# Patient Record
Sex: Female | Born: 1945 | Race: White | Hispanic: No | Smoking: Never smoker
Health system: Southern US, Community
[De-identification: ages and names within clinical notes are randomized; demographics above are authoritative.]

## PROBLEM LIST (undated history)

## (undated) DIAGNOSIS — F32A Depression, unspecified: Secondary | ICD-10-CM

## (undated) DIAGNOSIS — I429 Cardiomyopathy, unspecified: Secondary | ICD-10-CM

## (undated) DIAGNOSIS — I10 Essential (primary) hypertension: Secondary | ICD-10-CM

## (undated) DIAGNOSIS — I471 Supraventricular tachycardia, unspecified: Secondary | ICD-10-CM

## (undated) DIAGNOSIS — J984 Other disorders of lung: Secondary | ICD-10-CM

## (undated) DIAGNOSIS — M1990 Unspecified osteoarthritis, unspecified site: Secondary | ICD-10-CM

## (undated) DIAGNOSIS — I2510 Atherosclerotic heart disease of native coronary artery without angina pectoris: Secondary | ICD-10-CM

## (undated) DIAGNOSIS — J449 Chronic obstructive pulmonary disease, unspecified: Secondary | ICD-10-CM

## (undated) DIAGNOSIS — F419 Anxiety disorder, unspecified: Secondary | ICD-10-CM

## (undated) DIAGNOSIS — K5790 Diverticulosis of intestine, part unspecified, without perforation or abscess without bleeding: Secondary | ICD-10-CM

## (undated) DIAGNOSIS — R0600 Dyspnea, unspecified: Secondary | ICD-10-CM

## (undated) DIAGNOSIS — K219 Gastro-esophageal reflux disease without esophagitis: Secondary | ICD-10-CM

## (undated) DIAGNOSIS — F329 Major depressive disorder, single episode, unspecified: Secondary | ICD-10-CM

## (undated) DIAGNOSIS — G4730 Sleep apnea, unspecified: Secondary | ICD-10-CM

## (undated) DIAGNOSIS — S12100A Unspecified displaced fracture of second cervical vertebra, initial encounter for closed fracture: Secondary | ICD-10-CM

## (undated) HISTORY — PX: TUBAL LIGATION: SHX77

## (undated) HISTORY — DX: Atherosclerotic heart disease of native coronary artery without angina pectoris: I25.10

## (undated) HISTORY — DX: Anxiety disorder, unspecified: F41.9

## (undated) HISTORY — DX: Cardiomyopathy, unspecified: I42.9

## (undated) HISTORY — DX: Diverticulosis of intestine, part unspecified, without perforation or abscess without bleeding: K57.90

## (undated) HISTORY — PX: TOTAL KNEE ARTHROPLASTY: SHX125

## (undated) HISTORY — DX: Supraventricular tachycardia: I47.1

## (undated) HISTORY — PX: OTHER SURGICAL HISTORY: SHX169

## (undated) HISTORY — DX: Gastro-esophageal reflux disease without esophagitis: K21.9

## (undated) HISTORY — DX: Other disorders of lung: J98.4

## (undated) HISTORY — DX: Depression, unspecified: F32.A

## (undated) HISTORY — DX: Supraventricular tachycardia, unspecified: I47.10

## (undated) HISTORY — PX: ANTERIOR RELEASE VERTEBRAL BODY W/ POSTERIOR FUSION: SUR290

## (undated) HISTORY — PX: ABDOMINAL HYSTERECTOMY: SHX81

## (undated) HISTORY — DX: Essential (primary) hypertension: I10

## (undated) HISTORY — DX: Major depressive disorder, single episode, unspecified: F32.9

## (undated) HISTORY — DX: Chronic obstructive pulmonary disease, unspecified: J44.9

---

## 1971-09-14 HISTORY — PX: CHOLECYSTECTOMY: SHX55

## 2003-07-23 ENCOUNTER — Encounter: Admission: RE | Admit: 2003-07-23 | Discharge: 2003-07-23 | Payer: Self-pay | Admitting: Orthopedic Surgery

## 2005-05-19 ENCOUNTER — Inpatient Hospital Stay (HOSPITAL_COMMUNITY): Admission: RE | Admit: 2005-05-19 | Discharge: 2005-05-22 | Payer: Self-pay | Admitting: Orthopedic Surgery

## 2005-05-19 ENCOUNTER — Ambulatory Visit: Payer: Self-pay | Admitting: Physical Medicine & Rehabilitation

## 2005-10-12 ENCOUNTER — Encounter: Admission: RE | Admit: 2005-10-12 | Discharge: 2005-10-12 | Payer: Self-pay | Admitting: *Deleted

## 2005-10-18 ENCOUNTER — Ambulatory Visit: Payer: Self-pay | Admitting: Cardiology

## 2005-10-22 ENCOUNTER — Ambulatory Visit: Payer: Self-pay | Admitting: Cardiology

## 2005-12-01 ENCOUNTER — Inpatient Hospital Stay (HOSPITAL_COMMUNITY): Admission: RE | Admit: 2005-12-01 | Discharge: 2005-12-02 | Payer: Self-pay | Admitting: Orthopaedic Surgery

## 2006-08-08 ENCOUNTER — Observation Stay (HOSPITAL_COMMUNITY): Admission: RE | Admit: 2006-08-08 | Discharge: 2006-08-09 | Payer: Self-pay | Admitting: Orthopaedic Surgery

## 2008-05-14 ENCOUNTER — Encounter: Payer: Self-pay | Admitting: Cardiology

## 2008-05-31 ENCOUNTER — Ambulatory Visit: Payer: Self-pay | Admitting: Cardiology

## 2008-06-06 ENCOUNTER — Encounter: Payer: Self-pay | Admitting: Cardiology

## 2008-06-07 ENCOUNTER — Ambulatory Visit: Payer: Self-pay | Admitting: Cardiology

## 2008-06-17 ENCOUNTER — Ambulatory Visit: Payer: Self-pay | Admitting: Cardiology

## 2008-06-25 ENCOUNTER — Encounter: Payer: Self-pay | Admitting: Cardiology

## 2008-11-11 ENCOUNTER — Ambulatory Visit: Payer: Self-pay | Admitting: Cardiology

## 2008-11-14 ENCOUNTER — Ambulatory Visit: Payer: Self-pay | Admitting: Cardiovascular Disease

## 2008-11-14 ENCOUNTER — Inpatient Hospital Stay (HOSPITAL_BASED_OUTPATIENT_CLINIC_OR_DEPARTMENT_OTHER): Admission: RE | Admit: 2008-11-14 | Discharge: 2008-11-14 | Payer: Self-pay | Admitting: Cardiovascular Disease

## 2009-01-15 ENCOUNTER — Encounter: Payer: Self-pay | Admitting: Cardiology

## 2009-03-10 ENCOUNTER — Encounter: Payer: Self-pay | Admitting: Cardiology

## 2009-05-28 ENCOUNTER — Encounter: Payer: Self-pay | Admitting: Cardiology

## 2009-06-04 ENCOUNTER — Ambulatory Visit: Payer: Self-pay | Admitting: Cardiology

## 2009-06-04 DIAGNOSIS — E782 Mixed hyperlipidemia: Secondary | ICD-10-CM | POA: Insufficient documentation

## 2009-06-04 DIAGNOSIS — F411 Generalized anxiety disorder: Secondary | ICD-10-CM | POA: Insufficient documentation

## 2009-06-04 DIAGNOSIS — R002 Palpitations: Secondary | ICD-10-CM | POA: Insufficient documentation

## 2009-06-04 DIAGNOSIS — I2510 Atherosclerotic heart disease of native coronary artery without angina pectoris: Secondary | ICD-10-CM | POA: Insufficient documentation

## 2009-06-04 DIAGNOSIS — R0609 Other forms of dyspnea: Secondary | ICD-10-CM | POA: Insufficient documentation

## 2009-06-04 DIAGNOSIS — I10 Essential (primary) hypertension: Secondary | ICD-10-CM | POA: Insufficient documentation

## 2009-06-17 ENCOUNTER — Encounter: Payer: Self-pay | Admitting: Cardiology

## 2009-09-09 ENCOUNTER — Encounter (INDEPENDENT_AMBULATORY_CARE_PROVIDER_SITE_OTHER): Payer: Self-pay | Admitting: *Deleted

## 2009-11-28 ENCOUNTER — Encounter: Admission: RE | Admit: 2009-11-28 | Discharge: 2009-11-28 | Payer: Self-pay | Admitting: Orthopaedic Surgery

## 2010-02-13 ENCOUNTER — Ambulatory Visit (HOSPITAL_COMMUNITY): Admission: RE | Admit: 2010-02-13 | Discharge: 2010-02-13 | Payer: Self-pay | Admitting: Orthopedic Surgery

## 2010-02-18 ENCOUNTER — Encounter: Admission: RE | Admit: 2010-02-18 | Discharge: 2010-02-18 | Payer: Self-pay | Admitting: Orthopedic Surgery

## 2010-04-01 ENCOUNTER — Inpatient Hospital Stay (HOSPITAL_COMMUNITY): Admission: RE | Admit: 2010-04-01 | Discharge: 2010-04-03 | Payer: Self-pay | Admitting: Orthopedic Surgery

## 2010-08-01 ENCOUNTER — Encounter: Admission: RE | Admit: 2010-08-01 | Discharge: 2010-08-01 | Payer: Self-pay | Admitting: Neurology

## 2010-10-03 ENCOUNTER — Encounter: Payer: Self-pay | Admitting: Orthopedic Surgery

## 2010-10-22 ENCOUNTER — Other Ambulatory Visit: Payer: Self-pay | Admitting: Sports Medicine

## 2010-10-22 DIAGNOSIS — M545 Low back pain, unspecified: Secondary | ICD-10-CM

## 2010-10-27 ENCOUNTER — Ambulatory Visit
Admission: RE | Admit: 2010-10-27 | Discharge: 2010-10-27 | Disposition: A | Discharge status: Home / Self Care | Payer: MEDICARE | Source: Ambulatory Visit | Attending: Sports Medicine | Admitting: Sports Medicine

## 2010-10-27 DIAGNOSIS — M545 Low back pain, unspecified: Secondary | ICD-10-CM

## 2010-11-28 LAB — PROTIME-INR
INR: 0.93 (ref 0.00–1.49)
INR: 1.01 (ref 0.00–1.49)
INR: 1.26 (ref 0.00–1.49)
Prothrombin Time: 12.4 seconds (ref 11.6–15.2)
Prothrombin Time: 13.2 seconds (ref 11.6–15.2)
Prothrombin Time: 15.7 seconds — ABNORMAL HIGH (ref 11.6–15.2)

## 2010-11-28 LAB — BASIC METABOLIC PANEL
BUN: 10 mg/dL (ref 6–23)
BUN: 8 mg/dL (ref 6–23)
CO2: 28 mEq/L (ref 19–32)
CO2: 29 mEq/L (ref 19–32)
Calcium: 7.8 mg/dL — ABNORMAL LOW (ref 8.4–10.5)
Calcium: 7.9 mg/dL — ABNORMAL LOW (ref 8.4–10.5)
Chloride: 100 mEq/L (ref 96–112)
Chloride: 105 mEq/L (ref 96–112)
Creatinine, Ser: 0.7 mg/dL (ref 0.4–1.2)
Creatinine, Ser: 0.75 mg/dL (ref 0.4–1.2)
GFR calc Af Amer: >60 mL/min (ref >60)
GFR calc Af Amer: >60 mL/min (ref >60)
GFR calc non Af Amer: >60 mL/min (ref >60)
GFR calc non Af Amer: >60 mL/min (ref >60)
Glucose, Bld: 110 mg/dL — ABNORMAL HIGH (ref 70–99)
Glucose, Bld: 113 mg/dL — ABNORMAL HIGH (ref 70–99)
Potassium: 3.8 mEq/L (ref 3.5–5.1)
Potassium: 3.9 mEq/L (ref 3.5–5.1)
Sodium: 134 mEq/L — ABNORMAL LOW (ref 135–145)
Sodium: 139 mEq/L (ref 135–145)

## 2010-11-28 LAB — CBC
HCT: 29.9 % — ABNORMAL LOW (ref 36.0–46.0)
HCT: 32.9 % — ABNORMAL LOW (ref 36.0–46.0)
HCT: 42.4 % (ref 36.0–46.0)
Hemoglobin: 10 g/dL — ABNORMAL LOW (ref 12.0–15.0)
Hemoglobin: 11.1 g/dL — ABNORMAL LOW (ref 12.0–15.0)
Hemoglobin: 14.5 g/dL (ref 12.0–15.0)
MCH: 31.2 pg (ref 26.0–34.0)
MCH: 31.2 pg (ref 26.0–34.0)
MCH: 31.3 pg (ref 26.0–34.0)
MCHC: 33.5 g/dL (ref 30.0–36.0)
MCHC: 33.8 g/dL (ref 30.0–36.0)
MCHC: 34.1 g/dL (ref 30.0–36.0)
MCV: 91.9 fL (ref 78.0–100.0)
MCV: 92.3 fL (ref 78.0–100.0)
MCV: 93.2 fL (ref 78.0–100.0)
Platelets: 180 10*3/uL (ref 150–400)
Platelets: 196 10*3/uL (ref 150–400)
Platelets: 262 10*3/uL (ref 150–400)
RBC: 3.2 MIL/uL — ABNORMAL LOW (ref 3.87–5.11)
RBC: 3.56 MIL/uL — ABNORMAL LOW (ref 3.87–5.11)
RBC: 4.62 MIL/uL (ref 3.87–5.11)
RDW: 13 % (ref 11.5–15.5)
RDW: 13.1 % (ref 11.5–15.5)
RDW: 13.3 % (ref 11.5–15.5)
WBC: 10.8 10*3/uL — ABNORMAL HIGH (ref 4.0–10.5)
WBC: 9.4 10*3/uL (ref 4.0–10.5)
WBC: 9.8 10*3/uL (ref 4.0–10.5)

## 2010-11-28 LAB — COMPREHENSIVE METABOLIC PANEL
ALT: 20 U/L (ref 0–35)
AST: 23 U/L (ref 0–37)
Albumin: 4.6 g/dL (ref 3.5–5.2)
Alkaline Phosphatase: 60 U/L (ref 39–117)
BUN: 16 mg/dL (ref 6–23)
CO2: 27 mEq/L (ref 19–32)
Calcium: 9.3 mg/dL (ref 8.4–10.5)
Chloride: 100 mEq/L (ref 96–112)
Creatinine, Ser: 0.84 mg/dL (ref 0.4–1.2)
GFR calc Af Amer: >60 mL/min (ref >60)
GFR calc non Af Amer: >60 mL/min (ref >60)
Glucose, Bld: 97 mg/dL (ref 70–99)
Potassium: 3.7 mEq/L (ref 3.5–5.1)
Sodium: 136 mEq/L (ref 135–145)
Total Bilirubin: 0.9 mg/dL (ref 0.3–1.2)
Total Protein: 7.2 g/dL (ref 6.0–8.3)

## 2010-11-28 LAB — URINE MICROSCOPIC-ADD ON

## 2010-11-28 LAB — URINALYSIS, ROUTINE W REFLEX MICROSCOPIC
Bilirubin Urine: NEGATIVE
Glucose, UA: NEGATIVE mg/dL
Hgb urine dipstick: NEGATIVE
Ketones, ur: NEGATIVE mg/dL
Nitrite: NEGATIVE
Protein, ur: NEGATIVE mg/dL
Specific Gravity, Urine: 1.016 (ref 1.005–1.030)
Urobilinogen, UA: 0.2 mg/dL (ref 0.0–1.0)
pH: 6.5 (ref 5.0–8.0)

## 2010-11-28 LAB — SURGICAL PCR SCREEN
MRSA, PCR: POSITIVE — AB
Staphylococcus aureus: POSITIVE — AB

## 2010-11-28 LAB — APTT: aPTT: 28 seconds (ref 24–37)

## 2010-11-30 LAB — COMPREHENSIVE METABOLIC PANEL
ALT: 23 U/L (ref 0–35)
AST: 29 U/L (ref 0–37)
Albumin: 4 g/dL (ref 3.5–5.2)
Alkaline Phosphatase: 72 U/L (ref 39–117)
BUN: 9 mg/dL (ref 6–23)
CO2: 26 mEq/L (ref 19–32)
Calcium: 9.6 mg/dL (ref 8.4–10.5)
Chloride: 103 mEq/L (ref 96–112)
Creatinine, Ser: 0.83 mg/dL (ref 0.4–1.2)
GFR calc Af Amer: >60 mL/min (ref >60)
GFR calc non Af Amer: >60 mL/min (ref >60)
Glucose, Bld: 95 mg/dL (ref 70–99)
Potassium: 3.9 mEq/L (ref 3.5–5.1)
Sodium: 138 mEq/L (ref 135–145)
Total Bilirubin: 0.5 mg/dL (ref 0.3–1.2)
Total Protein: 6.8 g/dL (ref 6.0–8.3)

## 2010-11-30 LAB — CBC
HCT: 39.8 % (ref 36.0–46.0)
Hemoglobin: 13.8 g/dL (ref 12.0–15.0)
MCHC: 34.7 g/dL (ref 30.0–36.0)
MCV: 91 fL (ref 78.0–100.0)
Platelets: 263 10*3/uL (ref 150–400)
RBC: 4.38 MIL/uL (ref 3.87–5.11)
RDW: 12.6 % (ref 11.5–15.5)
WBC: 8 10*3/uL (ref 4.0–10.5)

## 2010-11-30 LAB — URINALYSIS, ROUTINE W REFLEX MICROSCOPIC
Bilirubin Urine: NEGATIVE
Glucose, UA: NEGATIVE mg/dL
Hgb urine dipstick: NEGATIVE
Ketones, ur: NEGATIVE mg/dL
Nitrite: NEGATIVE
Protein, ur: NEGATIVE mg/dL
Specific Gravity, Urine: 1.012 (ref 1.005–1.030)
Urobilinogen, UA: 0.2 mg/dL (ref 0.0–1.0)
pH: 6 (ref 5.0–8.0)

## 2010-11-30 LAB — DIFFERENTIAL
Basophils Absolute: 0 10*3/uL (ref 0.0–0.1)
Basophils Relative: 1 % (ref 0–1)
Eosinophils Absolute: 0.1 10*3/uL (ref 0.0–0.7)
Eosinophils Relative: 2 % (ref 0–5)
Lymphocytes Relative: 32 % (ref 12–46)
Lymphs Abs: 2.6 10*3/uL (ref 0.7–4.0)
Monocytes Absolute: 0.7 10*3/uL (ref 0.1–1.0)
Monocytes Relative: 9 % (ref 3–12)
Neutro Abs: 4.6 10*3/uL (ref 1.7–7.7)
Neutrophils Relative %: 57 % (ref 43–77)

## 2010-11-30 LAB — PROTIME-INR
INR: 0.97 (ref 0.00–1.49)
Prothrombin Time: 12.8 seconds (ref 11.6–15.2)

## 2010-11-30 LAB — APTT: aPTT: 26 seconds (ref 24–37)

## 2010-11-30 LAB — TYPE AND SCREEN
ABO/RH(D): A POS
Antibody Screen: NEGATIVE

## 2010-11-30 LAB — ABO/RH: ABO/RH(D): A POS

## 2010-12-24 LAB — POCT I-STAT 3, VENOUS BLOOD GAS (G3P V)
Acid-base deficit: 3 mmol/L — ABNORMAL HIGH (ref 0.0–2.0)
Bicarbonate: 22.4 mEq/L (ref 20.0–24.0)
O2 Saturation: 61 %
TCO2: 24 mmol/L (ref 0–100)
pCO2, Ven: 38.7 mmHg — ABNORMAL LOW (ref 45.0–50.0)
pH, Ven: 7.372 — ABNORMAL HIGH (ref 7.250–7.300)
pO2, Ven: 32 mmHg (ref 30.0–45.0)

## 2010-12-24 LAB — POCT I-STAT 3, ART BLOOD GAS (G3+)
Acid-base deficit: 3 mmol/L — ABNORMAL HIGH (ref 0.0–2.0)
Bicarbonate: 20.6 mEq/L (ref 20.0–24.0)
O2 Saturation: 94 %
TCO2: 22 mmol/L (ref 0–100)
pCO2 arterial: 33.3 mmHg — ABNORMAL LOW (ref 35.0–45.0)
pH, Arterial: 7.4 (ref 7.350–7.400)
pO2, Arterial: 68 mmHg — ABNORMAL LOW (ref 80.0–100.0)

## 2011-01-26 NOTE — Assessment & Plan Note (Signed)
[facility]                          [facility]   Michelle Zavala, Michelle Zavala                        MRN:          [id number]  DATE:06/17/2008                            DOB:          August 01, 1946    CARDIOLOGIST:  Jonelle Sidle, MD   PRIMARY CARE PHYSICIAN:  Dr. Samuel Jester   REASON FOR VISIT:  Two-week followup.   HISTORY OF PRESENT ILLNESS:  Michelle Zavala is a 65 year old female patient  whom I saw on May 31, 2008 with complaints of fairly atypical  chest pain.  She had been evaluated recently in the [facility] Emergency  Room.  She had a negative D-dimer.  She does have a CT scan of her chest  that was done in May of this year that revealed scattered pulmonary  nodules of unclear significance.  Recommendation was for followup CT  scanning in 6-12 months.  She did have a chest x-ray in September that  demonstrated a vague area of nodularity in the lower lateral chest  previously seen on the CT scan.  This was questionable for pulmonary  fibrosis.  I had the patient increase her Protonix to 40 mg twice a day  and start on metoprolol 25 mg twice a day and also gave her a  prescription for nitroglycerin p.r.n.  She underwent a stress test and  echocardiogram.  The stress Cardiolite revealed normal LV perfusion.  Her EF was 64%.  She did have poor exercise tolerance, only exercised  for 3 minutes and 26 seconds.  She had no chest pain, but did have  dyspnea.  Her echocardiogram revealed an EF of 50-55% mild mitral  regurgitation.  Today, she notes that she continues to have symptoms of  chest pain and shortness of breath.  She has a pleuritic component to  her symptoms that are located in the left lateral chest around her  breast to her posterior thoracic area.  This is clearly worse with  coughing or position changes.  She also notes tightness in her chest  that is fairly consistent.  She had used Combivent in the past with some  relief.  She has also noted some relief with nitroglycerin.  She does  note dyspnea with exertion.  This has been present for the last 3  months.  She describes NYHA class IIB-III symptoms.  She denies true  orthopnea.  She also denies true paroxysmal nocturnal dyspnea.  She does  have some difficulty sleeping at times.  She denies pedal edema.  She  denies syncope.  She does have a chronic cough.  This has been present  for the last several months.  There is productive of yellow sputum.  It  is usually in the mornings.  She has gone through 1 round of antibiotics  with her primary care physician.  Of note, she sees Dr. Orson Aloe later  this month for pulmonary evaluation.   MEDICATIONS:  Premarin 0.65 mg daily, Zoloft 100 mg daily, Micardis HCTZ  40/12.5 mg daily, Topiramate 50 mg nightly, Caltrate 600,  Vitamin D b.i.d., Protonix 40 mg b.i.d., Metoprolol  25 mg b.i.d.,  Aspirin 81 mg daily - The patient is not taking this, antidepressant  (recently started by her primary care physician), Ativan 2 mg p.r.n.,  Nitroglycerin p.r.n. chest pain.   PHYSICAL EXAMINATION:  GENERAL:  She is a well-nourished and well-  nourished female in no acute stress.  VITAL SIGNS:  Blood pressure is 142/93, pulse 73, and weight 216 pounds.  HEENT:  Normal.  NECK:  Without JVD.  CARDIAC:  Normal S1 and S2.  Regular rate and rhythm without murmur.  LUNGS:  Clear to auscultation bilaterally.  No wheezing, rhonchi, or  rales.  ABDOMEN:  Soft and nontender.  EXTREMITIES:  Trace edema bilaterally.  NEUROLOGIC:  She is alert and oriented x3.  Cranial nerves II-XII  grossly intact.  SKIN:  Warm and dry.   ASSESSMENT AND PLAN:  1. Chest pain and dyspnea.  As outlined above, the patient's recent      stress test revealed normal perfusion.  Her echocardiogram revealed      normal left ventricular function.  She has no significant valvular      abnormalities.  In the review of her recent workup in the emergency       room, she had a normal BNP.  She also had a normal D-dimer.      Adjusting her Protonix did not provide any symptom relief.  Also      the addition of metoprolol did not provide any symptom relief.  She      has actually had some side effects with this      (flushing/diaphoresis).  The patient has a long history of      secondhand smoke exposure.  It was quite possible that she does      have chronic obstructive pulmonary disease.  She also has reported      pulmonary fibrosis by radiographic testing.  She sees Dr. Orson Aloe      later this month.  I had a long discussion with the patient today      regarding further workup.  Also, discussed the case today with Dr.      Diona Browner.  At this point, we will continue to adjust her      medications for blood pressure to treat possible diastolic      dysfunction.  We will set up for pulmonary function testing with      [facility]CO.  This will hopefully assist Dr. Orson Aloe in his workup later      this month.  Regarding her abnormal chest CT in the past, further      recommendations will be per Dr. Orson Aloe.  I have provide her with      a prescription for Combivent inhaler 1-2 puffs q.i.d.  I have      advised her start using this after she completes her pulmonary      function testing.  She has a pleuritic component to her chest pain.      I have asked her to use heat over this area and also use      nonsteroidal anti-inflammatory medications consistently for the      next 5-7 days, then p.r.n. after that.  She will be brought back in      followup in the next several weeks.  If her pulmonary evaluation is      essentially negative, we can certainly entertain further      cardiovascular workup at that time, although ischemia seems  unlikely at this point.  2. Hypertension.  The patient's metoprolol will be discontinued.      Amlodipine will be added to her medical regimen 5 mg daily.  3. Depression.  New anti-depressant medication has been  added to her      medical regimen by primary care physician.  The patient actually      notes that she feels somewhat better on this medication.  I do not      think depression is the primary cause of her symptoms, although it      can certainly be contributing somewhat.  We will have to keep this      in mind as we go forward with her workup.   DISPOSITION:  The patient will be brought back in followup with Dr.  Diona Browner in the next 1-2 months for further review.      Tereso Newcomer, PA-C  Electronically Signed      Jonelle Sidle, MD  Electronically Signed   SW/MedQ  DD: 06/17/2008  DT: 06/18/2008  Job #: [id number]   cc:   Samuel Jester

## 2011-01-26 NOTE — Cardiovascular Report (Signed)
NAMEMarland Kitchen  Zavala, Michelle                 ACCOUNT NO.:  [id number]   MEDICAL RECORD NO.:  [id number]          PATIENT TYPE:  OIB   LOCATION:  1963                         FACILITY:  [facility]   PHYSICIAN:  Veverly Fells. Excell Seltzer, MD  DATE OF BIRTH:  [birth year]   DATE OF PROCEDURE:  11/14/2008  DATE OF DISCHARGE:  11/14/2008                            CARDIAC CATHETERIZATION   PROCEDURES:  1. Left heart catheterization.  2. Right heart catheterization.  3. Selective coronary angiography.  4. Left ventricular angiography.   INDICATIONS:  Michelle Zavala is a 65 year old woman with chronic chest pain  and shortness of breath.  Her most significant complaint is exertional  dyspnea.  She has also had some atypical left lateral chest discomfort.  She has undergone noninvasive testing and has been fairly unremarkable.  She was referred for right and left heart catheterization for definitive  evaluation in the setting of her severe symptoms.   Risks and indications of the procedure were reviewed with the patient  and informed consent was obtained.  The right groin was prepped, draped,  and anesthetized with 1% lidocaine using modified Seldinger technique.  A 4-French sheath was placed in the right femoral artery and 6-French  sheath was placed in the right femoral vein.  Multipurpose end-hole  catheter was used for the right heart catheterization.  Hemodynamic  pressures were recorded from the right atrium to the pulmonary capillary  wedge position.  Oxygen saturations were drawn.  Fick cardiac output was  calculated.  Standard 4-French Judkins catheters were used for coronary  angiography and left ventriculography and a J-wire was used for all  catheter exchanges.  The patient tolerated the procedure well.  There  were no immediate complications.   FINDINGS:  Right atrial pressure 5, right ventricular pressure 19/4,  pulmonary artery pressure 18/6 with a mean of 12, pulmonary capillary  wedge pressure 16,  left ventricular pressure 98/6, and aortic pressure  98/66 with a mean of 18.   Oxygen saturation of the pulmonary artery 61% and aorta 94%.   Fick cardiac output 4 L/minute.   CORONARY ANGIOGRAPHY:  Left mainstem:  Angiographically normal.  Bifurcates into the LAD and left circumflex.   LAD:  LAD is a large-caliber vessel proximally.  There is a moderate  size first diagonal branch with no obstructive disease.  The proximal  LAD has no evidence of obstructive stenosis with the exception of very  minor plaquing near the ostium of the vessel.  This appears to produce a  20-25% stenosis.  There is an abnormal filling pattern in the LAD with  slow flow in the proximal vessel.  The remaining portions of the mid and  distal LAD have no significant obstructive disease.  The LAD goes on to  supply two lower diagonal branches with no significant obstructive  disease.   Left circumflex:  The left circumflex is codominant with the right  coronary artery.  The vessel is of large caliber and supplies a moderate-  sized first OM branch.  There is some ectasia in the mid vessel.  Further down, there is a  widely patent second OM branch as well as a  left PDA branch.  There is no stenosis throughout the left circumflex.   Right coronary artery:  Small and codominant.  No significant  obstructive disease.  Supplies a small RV marginal branch.   Left ventriculography shows normal LV systolic function.  LVEF is  estimated at 60-65%.  The aorta at the level of the sinuses of Valsalva  appears mildly dilated as does the proximal aortic root.  No other  abnormalities noted.   ASSESSMENT:  1. Mild nonobstructive coronary artery disease involving the proximal      left anterior descending with abnormal left anterior descending      filling pattern.  2. No significant obstructive disease in the left mainstem, left      circumflex or right coronary artery.  3. Normal left ventricular function.  4.  Mildly dilated aortic root.  5. Normal right heart hemodynamics.   Michelle Zavala has minor nonobstructive coronary disease and should do well  with medical therapy.  Her hemodynamics are reassuring that her cardiac  function is not contributing to her symptoms of shortness of breath.  Her aortic root appears mildly dilated by angiography in the proximal  root and level of the sinuses of Valsalva.  However, her echocardiogram  findings were reviewed and the sinuses and root measured within the  normal limits.      Veverly Fells. Excell Seltzer, MD  Electronically Signed     MDC/MEDQ  D:  11/14/2008  T:  11/14/2008  Job:  [id number]   cc:   Learta Codding, MD,FACC  Jonelle Sidle, MD  Samuel Jester

## 2011-01-26 NOTE — Assessment & Plan Note (Signed)
[facility]                          [facility]   ROSELA, SUPAK                        MRN:          [id number]  DATE:05/31/2008                            DOB:          [birth year]    REFERRING PHYSICIAN:  Samuel Jester   CARIOLOGY CONSULTATION NOTE:   REASON FOR REFERRAL:  Chest pain.   HISTORY OF PRESENT ILLNESS:  Ms. Lauritsen is very pleasant 65 year old  female patient with a history of chest discomfort and palpitations.  She  was last seen in the office by Roxanne Mins, PA-C in 2007.  At that  time she underwent an adenosine Cardiolite that showed no sign of scar  or ischemia and her EF was normal.  The patient tells me she had a heart  catheterization in [facility] in the 1980s.  This  was apparently normal per her report.  I do not have those records at  this time.  Of note, she has also had carotid Dopplers performed in  January 2007, that showed no evidence of carotid plaquing.  She recently  presented to [facility] emergency room with complaints of chest  discomfort.  She had one set of cardiac markers that were negative.  Her  electrocardiogram was unremarkable.  She did have a chest x-ray that  showed a vague area of nodularity in the lower lateral chest previously  seen on CT examination representing an area of pulmonary fibrosis.  Otherwise, there was no acute process.  She was referred by Dr. Charm Barges  for further evaluation.   The patient notes a 77-month history of chest discomfort.  She describes  a stabbing type pain around her left breast that radiates to her back.  This initially began when she would cough or sneeze.  She also notes  discomfort with exertion.  In addition to this chest discomfort, she  notes substernal chest heaviness or tightness.  This is also with  exertion.  She notes associated shortness of breath.  She notes some  nausea at times, as well as some numbness  in her jaw.  She denies  syncope.  She has noted some near syncope in the past.  She does note  some episodes of tachy palpitations that seemed to be unassociated with  her chest pain.  In addition to the above, the patient also notes chest  discomfort with lying on her back, as well as with positional changes.   PAST MEDICAL HISTORY:  Significant for hypertension, GERD, depression,  diverticulosis and degenerative joint disease.  She is status post left  total knee replacement, cholecystectomy, total abdominal hysterectomy,  and bilateral salpingo-oophorectomy.  She is also status post cervical  spine surgery.   MEDICATIONS:  Protonix 40 mg daily, Premarin 0.625 mg daily, Zoloft 100  mg daily, Micardis, HCTZ 40/12.5 mg daily, Topiramate 850 mg nightly,  Naproxen 500 mg b.i.d.,  Fish oil 1 g daily, Caltrate plus vitamin D b.i.d., Ativan p.r.n.   ALLERGIES:  SULFA causes a rash.   SOCIAL HISTORY:  The patient has never been a smoker.  However, her  deceased husband used to smoke quite frequently.  She is a widow.  She  had 4 children, 3 of whom are alive.  She had one child who was shot to  death.   FAMILY HISTORY:  Significant for premature CAD.  She has several  siblings who have died from myocardial infarction.  She had a brother  who died at 17 and another brother in his 69s.  She has one brother who  is still live who had his first MI in his 89s.  He is apparently on the  transplant list.  She also has a sister who died of a myocardial  infarction in her 26s.   REVIEW OF SYSTEMS:  Please see HPI.  Denies fevers, chills, cough,  melena, hematochezia, hematuria.  Rest of the review of systems are  negative.   PHYSICAL EXAMINATION:  GENERAL:  She is a well-nourished, well-developed  female.  VITAL SIGNS:  Blood pressure is 132/94, pulse 92, weight 210 pounds.  HEENT:  Normal.  NECK:  Without JVD.  CARDIAC:  S1 and S2.  Regular rate and rhythm without murmur.  LUNGS:  Clear  to auscultation bilaterally.  ABDOMEN:  Soft, nontender.  EXTREMITIES:  Without edema.  Calves are nontender.  SKIN:  Warm and dry.  NEUROLOGIC:  She is alert and oriented x3.  Cranial nerves II through  XII are grossly intact.  VASCULAR:  Without carotid bruits bilaterally.  Femoral artery pulses 2+  bilateral without bruits.  Distal pulses are difficult to palpate.  MUSCULOSKELETAL:  No joint deformity.   Electrocardiogram from May 14, 2008, in emergency room showed sinus  rhythm with heart rate of 87, left axis deviation, poor R-wave  progression, no ischemic changes.   ASSESSMENT/PLAN:  1. Chest pain in a 65 year old female patient with cardiac risk      factors including a significant family history of coronary artery      disease, as well as hypertension and secondhand smoke exposure.      Her symptoms of chest pain have both typical and atypical features.      I think at this point in time it is reasonable to proceed with      stress testing initially.  I discussed this with the patient and      she agrees.  Therefore, she will be set up for a stress Cardiolite      to rule out ischemic heart disease.  We will also set up an      echocardiogram as she does complain of some shortness of breath.      Of note, she had labs performed at [facility], May 14, 2008, when she presented with complaints of chest pain.  Her      creatinine was 0.8 at that time, cardiac markers negative x1, D-      dimer 0.33, BNP 25, hemoglobin 13.8.  I have asked the patient to      increase her Protonix to 40 mg b.i.d. to cover for a      gastrointestinal etiology.  I have also placed her on metoprolol 25      mg b.i.d. for better blood pressure control, as well as to act as      an antianginal.  She has also been provided with a prescription for      p.r.n. nitroglycerin.  I have also asked her to go on an enteric-      coated aspirin 81 mg  daily.  We will bring her back in close  follow-      up after her stress test and echocardiogram.  I discussed the      patient's case today extensively with Dr. Diona Browner who agreed with      the above assessment and plan.  2. Hypertension.  As outlined above, we will add metoprolol to her      medical regimen.   DISPOSITION:  The patient will be brought back in followup in the next 2-  3 weeks with Dr. Diona Browner.      Tereso Newcomer, PA-C  Electronically Signed      Jonelle Sidle, MD  Electronically Signed   SW/MedQ  DD: 05/31/2008  DT: 06/01/2008  Job #: [id number]   cc:   Samuel Jester

## 2011-01-26 NOTE — Assessment & Plan Note (Signed)
[facility]                          [facility]   DEMRI, POULTON                        MRN:          [id number]  DATE:11/11/2008                            DOB:          1946/04/19    PRIMARY CARE PHYSICIAN:  Samuel Jester, M.D.   REASON FOR VISIT:  Follow up shortness of breath.   HISTORY OF PRESENT ILLNESS:  Ms. Pinales was seen in the office back in  October 2009 by Mr. Alben Spittle.  She has a history of atypical chest and  thoracic pain as well as progressive shortness of breath.  She has  undergone previous cardiac evaluation including a remote cardiac  catheterization at [facility] back in the 1980s  that apparently revealed no significant obstruction, as well as more  recently an exercise Cardiolite in September 2009 which showed limited  exercise capacity, however no diagnostic ST-segment changes or perfusion  evidence of ischemia with an ejection fraction of 64%.  Echocardiography  revealed a left ventricular ejection fraction of 50-55% with trace  aortic regurgitation, mild mitral regurgitation, and no description of  pulmonary hypertension, with a right ventricular systolic pressure of 15  mmHg.   When she was last seen, we were most concerned about the possibility of  lung disease, as she had undergone a CT scan of the chest in the past  that was abnormal, suggesting possibly fibrotic changes as well as  pulmonary nodular changes.  She had been treated with a course of  antibiotics along the way and we ultimately referred her for pulmonary  function tests as well as subsequent evaluation by Dr. Orson Aloe from a  pulmonary perspective.  Her pulmonary function tests revealed a moderate  decrease in diffusion capacity, but no major obstructive lung defect  based on FEV1/FVC ratio which was 73% of predicted.  She was seen by Dr.  Orson Aloe and the patient tells me that she was diagnosed with  bronchitis.  She was also placed on p.r.n. inhaler therapies and reports  that her shortness of breath has not improved.  She has had no  productive cough, fevers or chills.   She continues to describe a somewhat pleuritic-sounding left lateral  thoracic pain, but on top of this a progressive shortness of breath with  activities such as climbing stairs.  She has a family history of  premature coronary artery disease and remains worried about her cardiac  status, and we did talk about the possibility of proceeding to a right  and left heart catheterization to clearly define the coronary anatomy  and assess pulmonary pressures.  We discussed the potential risks and  benefits and she is in agreement to proceed.  If these studies are  reassuring, we can refer her back to Dr. Orson Aloe for further  evaluation.   ALLERGIES:  SULFA DRUGS (rash).  No IV contrast allergies.   PRESENT MEDICATIONS:  1. Premarin 0.625 mg p.o. daily.  2. Zoloft 100 mg p.o. daily.  3. Topiramate 50 mg p.o. q.h.s.  4. Caltrate with vitamin D 600 mg p.o. b.i.d.  5. Protonix  40 mg p.o. b.i.d.  6. Triamterene and hydrochlorothiazide 37.5//25 mg p.o. daily/  7. Sublingual nitroglycerin 0.4 mg p.r.n.  8. Ativan 2 mg p.o. p.r.n.   PAST MEDICAL HISTORY, SOCIAL HISTORY, FAMILY HISTORY:  All reviewed  previously.  Pertinent findings include hypertension and known  gastroesophageal reflux disease.  She has no primary tobacco use history  but has had a significant history of secondary cigarette smoke exposure.  Her family history is also significant for premature cardiovascular  disease including most of her siblings.   REVIEW OF SYSTEMS:  As detailed above.  Otherwise negative.   EXAMINATION:  Blood pressure 127/90, heart rate is 82, weight is 216  pounds.  This is an overweight woman in no acute distress.  HEENT:  Conjunctivae normal.  Pharynx clear.  NECK:  Supple.  No elevated jugular venous pressure, no carotid  bruits,  no lymphadenopathy.  LUNGS:  Clear without pain at rest.  CARDIAC EXAM:  Reveals a regular rate and rhythm.  No loud murmur or  gallop.  ABDOMEN:  Soft, nontender, normoactive bowel sounds.  EXTREMITIES:  Exhibit no frank pitting edema.  Distal pulses are 2+.  SKIN:  Warm and dry.  MUSCULOSKELETAL:  No kyphosis noted.  NEUROPSYCHIATRIC:  Patient is alert and oriented x3, affect is  appropriate.   IMPRESSION AND RECOMMENDATIONS:  Persistent shortness of breath with  activity, not necessarily associated with atypical left lateral thoracic  discomfort and chest discomfort, which she has also experienced  intermittently.  Pulmonary etiology remains possible, although her  pulmonary function tests did not reveal any major obstructive defect.  She did have a previous abnormal CT scan of the chest and question about  possible fibrotic disease.  Dr. Orson Aloe has evaluated Ms. Darwish and  at this point she has used some inhaler treatments, although without  resolution of her symptoms.  Noninvasive ischemic testing over the last  6 months was reassuring, although in light of her family history of  premature cardiovascular disease as well as persistent symptoms, we  talked about proceeding on to a right and left heart catheterization to  best assess her coronary anatomy and pulmonary pressures.  If this is in  fact reassuring, then we will get Ms. Feltz back to see Dr. Orson Aloe  for further evaluation.  The potential risks and benefits were explained  to her and she agrees.  Baseline labs will be obtained.  Further plans  to follow.     Jonelle Sidle, MD  Electronically Signed    SGM/MedQ  DD: 11/11/2008  DT: 11/11/2008  Job #: [id number]   cc:   Samuel Jester

## 2011-01-29 NOTE — Discharge Summary (Signed)
NAMEMarland Zavala  AMELA, HANDLEY                 ACCOUNT NO.:  [id number]   MEDICAL RECORD NO.:  [id number]          PATIENT TYPE:  INP   LOCATION:  5022                         FACILITY:  [facility]   PHYSICIAN:  Loreta Ave, M.D. DATE OF BIRTH:  24-Aug-1946   DATE OF ADMISSION:  05/19/2005  DATE OF DISCHARGE:  05/22/2005                                 DISCHARGE SUMMARY   FINAL DIAGNOSES:  1.  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  2.  Hypertension, not otherwise specified.  3.  Esophageal reflux disease.   HISTORY OF PRESENT ILLNESS:  A 65 year old female with a history of end-  stage DJD, left knee, and chronic pain, who presented to our office for  preoperative evaluation for left total knee replacement.  She had  progressively worsening pain with failed response with conservative  treatment.  Significant decrease in her daily activities due to the ongoing  pain.   HOSPITAL COURSE:  On May 19, 2005, the patient was taken to the [facility] operating room and had a left total knee replacement procedure  performed.  Surgeon, Loreta Ave, M.D., and assistant, Genene Churn. Barry Dienes,  P.A.-C.  Anesthesia, general.  EBL minimal.  Tourniquet time 70 minutes.  There were no surgical or anesthesia complications, and the patient was  transferred to recovery in stable condition.  A Hemovac drain x1 placed.  May 20, 2005, the patient had good pain control without specific  complaints.  Vital signs stable, afebrile.  Hemoglobin 10.4.  INR 1.0.  Potassium 3.2.  Slight bleeding through dressing.  Drain intact.  Calf  nontender, neurovascularly intact.  Dressing reinforced.  Given potassium  chloride 20 mEq p.o. x1 dose.  Pharmacy-protocol Coumadin started.  May 21, 2005, doing well without specific complaints.  Vital signs  stable, afebrile.  Hemoglobin 10.0.  Potassium 3.4.  INR 1.0.  The wound  looked good, staples intact.  No sign of  infection.  Hemovac drain pulled.  Calf nontender, neurovascular intact.  Discontinued PCA and Foley and  heparin-locked IV.  May 22, 2005, the patient doing well.  Vital signs  stable, afebrile.  She had completed hall ambulation and stairs.  Ready for  discharge home.   DISCHARGE MEDICATIONS:  1.  Percocet 5/325 mg one to two tablets p.o. q.4-6h. p.r.n. for pain.  2.  Coumadin per pharmacy protocol.  3.  Resume previous medications.   CONDITION ON DISCHARGE:  Stable.   DISPOSITION:  Discharged home.   INSTRUCTIONS:  She will work with home health PT/OT to improve range of  motion, strength and ambulation.  Knee staples to be removed two weeks  postop and will be on Coumadin four weeks postop for DVT prophylaxis.  Follow up in the office in 10 days for recheck.  Return sooner if needed.      Genene Churn. Denton Meek.      Loreta Ave, M.D.  Electronically Signed    JMO/MEDQ  D:  08/04/2005  T:  08/04/2005  Job:  [id number]

## 2011-01-29 NOTE — Op Note (Signed)
NAMEADANYA, SOSINSKI                 ACCOUNT NO.:  [id number]   MEDICAL RECORD NO.:  [id number]          PATIENT TYPE:  INP   LOCATION:  2899                         FACILITY:  [facility]   PHYSICIAN:  Mark C. Ophelia Charter, M.D.    DATE OF BIRTH:  [birth year]   DATE OF PROCEDURE:  08/08/2006  DATE OF DISCHARGE:                               OPERATIVE REPORT   PREOPERATIVE DIAGNOSIS:  C6-C7 pseudoarthrosis, status post C5 to C7  anterior cervical fusion and plating.   POSTOPERATIVE DIAGNOSIS:  C6-C7 pseudoarthrosis, status post C5 to C7  anterior cervical fusion and plating.   PROCEDURE:  C6-C7 posterior cervical fusion with wiring.  VITOSS bone  graft substitute plus iliac crest aspirate.   SURGEON:  Mark C. Ophelia Charter, M.D.   ASSISTANT:  Wende Neighbors, P.A.   ANESTHESIA:  GET.   ESTIMATED BLOOD LOSS:  100 mL.   DESCRIPTION OF PROCEDURE:  After induction of general anesthesia and  orotracheal intubation, the patient was placed prone on horseshoe head  holder; head was raised but lowered to take pressure off of the eyes.  The eyes were carefully checked and horseshoe head holder was positioned  where the patient had no pressure on her eyes.  Area was squared with  towels at the neck; foam pad was placed on the sternum and posterior  cervical spine was prepped with DuraPrep as well as the right posterior  iliac crest.   After DuraPrep, sterile skin marker, Betadine, Vi-Drape, and after  squaring the area with towels, sterile mounts at the head and thyroid  sheet and drape.  Midline incision was made from C4 to C7.  Subperiosteal dissection to the lamina was performed; checking with a  Kocher, it appeared that C5-C6 had just a trace motion, mostly due to  some bone bending, and there was obvious motion at C6-C7, the level of  the pseudoarthrosis with at least 3 to 4 mm of motion.  X-rays were  taken.  Initial x-ray did not help due to the patient's wide shoulders.  A  second x-ray was taken  with a clamp at C4-C5 and one in between the  spinous processes of C5-C6 which confirmed we were where we had  determined we were by landmarks and motion.  Three strands of 24-gauge  wire were then twisted up with tension applied into a cable.  Passed  underneath the base of C7 with a right angle clamp, passed around the  top of C6 and then tightened down securely twisting down, cutting the  wires.  Kocher was tested and there was no motion.  Stab incision was  made on the right iliac crest.  Bone was palpated, tapped in with a  trocar, aspiration performed; 5 to 7 mL of bone were placed on a 5 mL  VITOSS bone substitute strip which was a net volume of 5 mL.  Lot  #[id number].  After it set for 15 minutes with the cover covering it, it  was cut in the middle and packed onto the decorticated lamina of C6 and  C7.  Deep fascia was  closed with 0 Vicryl with saline irrigation.  Superficial fascia closed with 0  Vicryl, 2-0 Vicryl in the subcutaneous tissue, subcuticular skin  closure, tincture of benzoin, Steri-Strips, Marcaine infiltration.  Soft  cervical collar.  Transferred to the recovery room.   Instrument count, needle count were correct.      Mark C. Ophelia Charter, M.D.  Electronically Signed     MCY/MEDQ  D:  08/08/2006  T:  08/09/2006  Job:  [id number]

## 2011-01-29 NOTE — Op Note (Signed)
NAME:  Michelle Zavala, Michelle Zavala                 ACCOUNT NO.:  [id number]   MEDICAL RECORD NO.:  [id number]          PATIENT TYPE:  INP   LOCATION:  NA                           FACILITY:  [facility]   PHYSICIAN:  Loreta Ave, M.D. DATE OF BIRTH:  November 19, 1945   DATE OF PROCEDURE:  05/19/2005  DATE OF DISCHARGE:                                 OPERATIVE REPORT   PREOPERATIVE DIAGNOSIS:  End-stage degenerative arthritis, left knee with  valgus alignment.   POSTOPERATIVE DIAGNOSIS:  End-stage degenerative arthritis, left knee with  valgus alignment.   PROCEDURE:  Left total knee replacement with soft tissue balancing.  [employer]  [employer] prosthesis.  Cemented #7 posterior stabilized femoral component.  Cemented #7 tibial component.  10 mm posterior stabilized flexed  polyethylene insert.  Cemented recess 26 mm patellar component.   SURGEON:  Loreta Ave, M.D.   ASSISTANT:  Zonia Kief, P.A.   ANESTHESIA:  General.   ESTIMATED BLOOD LOSS:  Minimal.   TOURNIQUET TIME:  1 hour 10 minutes.   SPECIMENS:  Excised bone and soft tissue.   CULTURES:  None.   COMPLICATIONS:  None.   DRESSINGS:  Soft compressive with knee immobilizer.   DRAINS:  Hemovac x1.   DESCRIPTION OF PROCEDURE:  The patient was brought to the operating room and  placed on the operating table in the supine position.  After adequate  anesthesia had been obtained, the left knee was examined.  Increased valgus  correctable to normal mechanical axis.  Stable ligaments.  Mild flexion  contracture further flexed 120 degrees.  Reasonably good patellofemoral  tracking.  Tourniquet applied.  Prepped and draped in the usual sterile  fashion.  Exsanguinated with elevation of the Esmarch.  Tourniquet inflated  to 350 mmHg.  Straight incision above the patella about the tibial tubercle.  Hemostasis with cautery.  Medial parapatellar arthrotomy.  Knee exposed.  Grade IV changes throughout, most marked in the lateral compartment  but  really involving all compartments.  Remnants of menisci, para-articular  spurs, loose body, cruciate ligaments excised.  Distal femur exposed.  Intramedullary guide placed.  Distal cut 75 degrees of valgus resecting 10  mm to restore normal mechanical access.  Size 7 component jig put in place.  Definitive cuts made.  Tibia exposed.  Tibial spine removed with the saw.  Sized with #7  component.  Proximal cut 5 degree of posterior slope was cut,  resecting 5 mm off the medial more deficient side.  All recess examined.  All loose fragments removed.  All spurs removed.  The patella was then  sized, reamed and filled for a 26 mm recess component.  Trials put in place,  #7 on the femur, #7 on the tibia.  With the 10 mm insert after soft tissue  balancing, full extension, full flexion, no lift off.  Marked for  appropriate rotation and then hand reamed.  There was good patellofemoral  tracking.  All trials had been removed.  Copious irrigation with the pulse  irrigating device.  Cement prepared and placed on all components which were  firmly seated  to remove any excessive cement.  Polyethylene attached to the  tibia.  Knee reduced.  After the cement hardened, the knee was reexamined.  Full extension, full flexion.  Good patellofemoral tracking, good stability.  Wound irrigated.  Hemovac placed and brought out through a separate stab  wound.  Arthrotomy closed with #1 Vicryl, skin and subcutaneous tissue with  Vicryl and staples.  Knee injected with Marcaine and Hemovac clamp.  Sterile  compressing dressing applied.  Tourniquet deflated and removed.  Knee  immobilizer applied.  Anesthesia reversed.  Brought to the recovery room.  He tolerated the surgery well with no complications.      Loreta Ave, M.D.  Electronically Signed     DFM/MEDQ  D:  05/19/2005  T:  05/19/2005  Job:  [id number]

## 2011-01-29 NOTE — Op Note (Signed)
NAMEMarland Kitchen  SRIYA, KROEZE                 ACCOUNT NO.:  [id number]   MEDICAL RECORD NO.:  [id number]          PATIENT TYPE:  INP   LOCATION:  2899                         FACILITY:  [facility]   PHYSICIAN:  Mark C. Ophelia Charter, M.D.    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   OPERATION PERFORMED:  C5-6, C6-7 anterior cervical diskectomy and fusion,  allograft and plating (two level).   SURGEON:  Mark C. Ophelia Charter, M.D.   ANESTHESIA:  General orotracheal anesthesia plus Marcaine local.   ESTIMATED BLOOD LOSS:  Minimal.   DRAINS:  One Hemovac in neck.   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  She is having  radicular symptoms from an HNP at C6-7 on the left, paracentral.   DESCRIPTION OF PROCEDURE:  After induction of general anesthesia, horse shoe  attachment, careful positioning, arms were tucked at the side, neck was  prepped after chin strap head halter was applied carefully and taped.  Next,  prepped with DuraPrep.  Area squared with towels.  Sterile skin marker wa  used on the skin and area squared with towels, Betadine Vi-drape  application.  Laminectomy sheets, sterile Mayo stand at the head, half  sheets above.  Incision made starting at the midline extending to the left  overlying the C6 vertebra.  Dissection down to large spur was performed with  carotid sheath and contents lateral,.  Spur was noted. Cross-table lateral x-  ray C-arm confirmed that this was expected C5-6 level.  Level below which  had the disk was surgically treated first.  Diskectomy was performed.  This  level was hypermobile with disk that was easily removed with  chunks.  End  plates were curetted.  Operative microscope was draped and brought in.  Microdiskectomy was performed removing the remaining portion of the  posterior disk and then removing spurs with 1 and 2 mm Kerrison.  There was  a pocket where the disk herniation was present but it was retained by the  posterior longitudinal ligament.  Ligament was taken down to make sure there  were no extruded fragments. Cord was decompressed.  There were no extruded  fragments present. Uncovertebral joints were stripped and foramina were  enlarged with 1 and 2 mm Kerrisons.  Once this was performed, sizing was  performed, 7 mm graft was selected after end plates had been burred slightly  and curetted meticulously to square off the area.  Graft was inserted.  Identical procedure was repeated at C5-6.  The 1 cm osteophytes were removed  with Leksell initially and then sequential curetting, burring.  Space was  extremely tight.  Bur had to be used to open up slightly before the hand  curettes could be used.  Using the Cloward curettes, diskectomy was  performed.  Posterior longitudinal ligament was taken down.  Spurs were  removed.  There was no extruded fragments and this was primarily all  osteophyte overgrowth causing narrowing.  Uncovertebral joints were  stripped.  Posterior longitudinal ligament was taken down partially.  A 6 mm  graft was selected which was an allograft, inserted and then a 30 mm plate  was selected.  Fluoroscopy was used to check AP and lateral and all six  screw holes were filled.  Position looked good on both views and after  irrigation, Hemovac was placed through  separate stab incision with in and out technique.  Platysma closed with 3-0  and 4-0 Vicryl subcuticular skin closure.  Tincture of benzoin and Steri-  Strips, 4 x 4s, tape after Marcaine infiltration of the skin and soft  cervical collar.  The patient was then transferred to the recovery room  neurologically  intact.      Mark C. Ophelia Charter, M.D.  Electronically Signed     MCY/MEDQ  D:  12/01/2005  T:  12/03/2005  Job:  [id number]

## 2011-05-24 ENCOUNTER — Encounter (INDEPENDENT_AMBULATORY_CARE_PROVIDER_SITE_OTHER): Payer: Medicare Other | Admitting: *Deleted

## 2011-05-24 DIAGNOSIS — R002 Palpitations: Secondary | ICD-10-CM

## 2011-05-24 DIAGNOSIS — R079 Chest pain, unspecified: Secondary | ICD-10-CM

## 2011-06-04 ENCOUNTER — Encounter: Payer: Medicare Other | Admitting: Cardiovascular Disease

## 2011-06-17 ENCOUNTER — Telehealth: Payer: Self-pay | Admitting: *Deleted

## 2011-06-17 NOTE — Telephone Encounter (Signed)
Per Dr. Jari Sportsman review of Holter: Sinus rhythm with Sinus tachycardia. Short runs of SVT. Frequent PVC's. Pt needs f/u office scheduled (with Dr. Diona Browner, if possible).   Pt previously had appt scheduled for 9/21 to discuss holter but cancelled this office visit.   Attempted to reach pt. No answer, no voicemail.

## 2011-07-08 NOTE — Telephone Encounter (Signed)
No answer, no voicemail.

## 2011-07-14 ENCOUNTER — Other Ambulatory Visit: Payer: Self-pay | Admitting: Sports Medicine

## 2011-07-14 DIAGNOSIS — R52 Pain, unspecified: Secondary | ICD-10-CM

## 2011-07-14 NOTE — Telephone Encounter (Signed)
Pt returned call. Notified of results. Scheduled w/Dr. Diona Browner (per Dr. Kirke Corin and pt's request) on 07/22/11.

## 2011-07-14 NOTE — Telephone Encounter (Signed)
No answer, no voicemail on home number.  Left message on cell phone to call back on voicemail.

## 2011-07-17 ENCOUNTER — Ambulatory Visit
Admission: RE | Admit: 2011-07-17 | Discharge: 2011-07-17 | Disposition: A | Discharge status: Home / Self Care | Payer: Medicare Other | Source: Ambulatory Visit | Attending: Sports Medicine | Admitting: Sports Medicine

## 2011-07-17 DIAGNOSIS — R52 Pain, unspecified: Secondary | ICD-10-CM

## 2011-07-21 ENCOUNTER — Encounter: Payer: Self-pay | Admitting: Cardiology

## 2011-07-22 ENCOUNTER — Ambulatory Visit (INDEPENDENT_AMBULATORY_CARE_PROVIDER_SITE_OTHER): Payer: Medicare Other | Admitting: Cardiology

## 2011-07-22 ENCOUNTER — Encounter: Payer: Self-pay | Admitting: Cardiology

## 2011-07-22 ENCOUNTER — Other Ambulatory Visit: Payer: Self-pay | Admitting: *Deleted

## 2011-07-22 VITALS — BP 146/105 | HR 100 | Ht 64.0 in | Wt 215.0 lb

## 2011-07-22 DIAGNOSIS — I10 Essential (primary) hypertension: Secondary | ICD-10-CM

## 2011-07-22 DIAGNOSIS — I471 Supraventricular tachycardia, unspecified: Secondary | ICD-10-CM

## 2011-07-22 DIAGNOSIS — R002 Palpitations: Secondary | ICD-10-CM

## 2011-07-22 DIAGNOSIS — I2510 Atherosclerotic heart disease of native coronary artery without angina pectoris: Secondary | ICD-10-CM

## 2011-07-22 MED ORDER — METOPROLOL SUCCINATE ER 50 MG PO TB24: 50.0000 mg | ORAL_TABLET | Freq: Every day | ORAL | Status: DC

## 2011-07-22 NOTE — Assessment & Plan Note (Signed)
Recently documented by 48-hour Holter monitor, possibly a reentrant mechanism. No definite atrial fibrillation noted. No unusual pauses. Plan is to initiate Toprol-XL 50 mg daily and arrange followup.

## 2011-07-22 NOTE — Assessment & Plan Note (Signed)
Blood pressure elevated today. Patient reports compliance with her medication, admits that she was anxious about today's visit. Addition of Toprol-XL may also be beneficial.

## 2011-07-22 NOTE — Patient Instructions (Addendum)
Your physician recommends that you schedule a follow-up appointment in: 3 Months in [place] with Dr Diona Browner  Your physician has recommended you make the following change in your medication:   Toprol XL 50 mg daily

## 2011-07-22 NOTE — Assessment & Plan Note (Signed)
May be explained by documented PSVT.

## 2011-07-22 NOTE — Progress Notes (Signed)
Clinical Summary Michelle Zavala is a 65 y.o.female presenting for followup. I saw her back in September of 2010. History and evaluation are reviewed in the prior office note. Back at that time she was referred for a CardioNet monitor that demonstrated no significant arrhythmias, and no specific followup was arranged.   More recently, it appears that she wore a 48 hour Holter monitor secondary to palpitations, ordered through an urgent care center in [place] and interpreted by Dr. Kirke Corin. Predominant rhythm was sinus, with rare PVCs, some in a pattern of trigeminy. She also have had bursts of SVT, regular and heart rates between 140-150. We discussed the result today.  Symptomatically, she describes intermittent palpitations associated with chest discomfort. No syncope. Denies any unusual caffeine or other stimulant intake. Recent baseline ECGs reviewed showing leftward axis but no other acute ST segment changes. No clear evidence of preexcitation.   Allergies  Allergen Reactions  . Sulfonamide Derivatives     REACTION: hives    Medication list reviewed.  Past Medical History  Diagnosis Date  . Coronary atherosclerosis of native coronary artery     Mild atherosclerosis 3/10, LVEF 60-65%  . Depression   . GERD (gastroesophageal reflux disease)   . Diverticulosis   . Anxiety   . Chronic lung disease     Fibrosis - Dr. Orson Aloe  . Essential hypertension, benign     Past Surgical History  Procedure Date  . Cholecystectomy   . Cesarean section   . Total knee arthroplasty   . Anterior release vertebral body w/ posterior fusion   . Abdominal hysterectomy     Family History  Problem Relation Age of Onset  . Coronary artery disease Father     Premature  . Coronary artery disease Brother     Premature  . Coronary artery disease Sister     Premature    Social History Michelle Zavala reports that she has never smoked. She has never used smokeless tobacco. Michelle Zavala reports that she does  not drink alcohol.  Review of Systems As outlined above, otherwise negative.  Physical Examination Filed Vitals:   07/22/11 1427  BP: 146/105  Pulse: 100   Obese woman in no acute distress. HEENT: Conjunctiva and lids normal, oropharynx with moist mucosa. Neck: Supple, no elevated JVP or carotid bruits. Lungs: Clear to auscultation, nonlabored. Cardiac: Regular rate and rhythm, no S3 gallop or rub. Abdomen: Soft, nontender, bowel sounds present. Skin: Warm and dry. Musculoskeletal: No kyphosis. Extremities: No pitting edema, distal pulses full. Neuropsychiatric: Alert and oriented x3, affect grossly normal.     Problem List and Plan

## 2011-07-22 NOTE — Assessment & Plan Note (Signed)
Mild atherosclerosis documented at cardiac catheterization in 2010.

## 2012-02-22 ENCOUNTER — Ambulatory Visit (INDEPENDENT_AMBULATORY_CARE_PROVIDER_SITE_OTHER): Payer: Medicare Other | Admitting: Cardiology

## 2012-02-22 ENCOUNTER — Encounter: Payer: Self-pay | Admitting: Cardiology

## 2012-02-22 VITALS — BP 128/82 | HR 75 | Ht 64.0 in | Wt 224.8 lb

## 2012-02-22 DIAGNOSIS — I10 Essential (primary) hypertension: Secondary | ICD-10-CM

## 2012-02-22 DIAGNOSIS — I779 Disorder of arteries and arterioles, unspecified: Secondary | ICD-10-CM | POA: Insufficient documentation

## 2012-02-22 DIAGNOSIS — I679 Cerebrovascular disease, unspecified: Secondary | ICD-10-CM

## 2012-02-22 DIAGNOSIS — R0989 Other specified symptoms and signs involving the circulatory and respiratory systems: Secondary | ICD-10-CM

## 2012-02-22 DIAGNOSIS — I471 Supraventricular tachycardia: Secondary | ICD-10-CM

## 2012-02-22 DIAGNOSIS — I2510 Atherosclerotic heart disease of native coronary artery without angina pectoris: Secondary | ICD-10-CM

## 2012-02-22 NOTE — Assessment & Plan Note (Signed)
Mild by prior cardiac catheterization without significant angina.

## 2012-02-22 NOTE — Assessment & Plan Note (Signed)
Palpitations are better controlled on beta blocker therapy. No changes made today. Continue observation.

## 2012-02-22 NOTE — Assessment & Plan Note (Signed)
Blood pressure well-controlled today. 
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
## 2012-02-22 NOTE — Assessment & Plan Note (Signed)
White matter changes by recent CT imaging in May. In light of known atherosclerosis based on prior cardiac assessment, carotid Dopplers will also be obtained for screening.

## 2012-02-22 NOTE — Patient Instructions (Signed)
Your physician recommends that you schedule a follow-up appointment in: 6 months. You will receive a reminder letter in the mail in about 4 months reminding you to call and schedule your appointment. If you don't receive this letter, please contact our office. Your physician recommends that you continue on your current medications as directed. Please refer to the Current Medication list given to you today. Your physician has requested that you have a carotid duplex. This test is an ultrasound of the carotid arteries in your neck. It looks at blood flow through these arteries that supply the brain with blood. Allow one hour for this exam. There are no restrictions or special instructions.  

## 2012-03-02 ENCOUNTER — Encounter (INDEPENDENT_AMBULATORY_CARE_PROVIDER_SITE_OTHER): Payer: Medicare Other

## 2012-03-02 DIAGNOSIS — I6529 Occlusion and stenosis of unspecified carotid artery: Secondary | ICD-10-CM

## 2012-03-02 DIAGNOSIS — I679 Cerebrovascular disease, unspecified: Secondary | ICD-10-CM

## 2012-03-07 ENCOUNTER — Telehealth: Payer: Self-pay | Admitting: *Deleted

## 2012-03-07 NOTE — Telephone Encounter (Signed)
Message copied by Eustace Moore on Tue Mar 07, 2012  4:29 PM ------      Message from: MCDOWELL, Illene Bolus      Created: Mon Mar 06, 2012  1:04 PM       Only mild bilateral ICA plaque. Followup study in one year.

## 2012-03-07 NOTE — Telephone Encounter (Signed)
Left message via voiemail for patient to call office.

## 2012-03-09 NOTE — Telephone Encounter (Signed)
Left message for patient to call office.  

## 2012-03-09 NOTE — Telephone Encounter (Signed)
Patient informed. 

## 2012-03-27 ENCOUNTER — Other Ambulatory Visit: Payer: Self-pay | Admitting: Urology

## 2012-03-27 DIAGNOSIS — R519 Headache, unspecified: Secondary | ICD-10-CM

## 2012-03-27 DIAGNOSIS — H538 Other visual disturbances: Secondary | ICD-10-CM

## 2012-03-29 ENCOUNTER — Other Ambulatory Visit: Payer: Self-pay | Admitting: Neurosurgery

## 2012-03-29 DIAGNOSIS — M47816 Spondylosis without myelopathy or radiculopathy, lumbar region: Secondary | ICD-10-CM

## 2012-04-07 ENCOUNTER — Ambulatory Visit
Admission: RE | Admit: 2012-04-07 | Discharge: 2012-04-07 | Disposition: A | Discharge status: Home / Self Care | Payer: Medicare Other | Source: Ambulatory Visit | Attending: Urology | Admitting: Urology

## 2012-04-07 ENCOUNTER — Ambulatory Visit
Admission: RE | Admit: 2012-04-07 | Discharge: 2012-04-07 | Disposition: A | Discharge status: Home / Self Care | Payer: Medicare Other | Source: Ambulatory Visit | Attending: Neurosurgery | Admitting: Neurosurgery

## 2012-04-07 DIAGNOSIS — H538 Other visual disturbances: Secondary | ICD-10-CM

## 2012-04-07 DIAGNOSIS — R519 Headache, unspecified: Secondary | ICD-10-CM

## 2012-04-07 DIAGNOSIS — M47816 Spondylosis without myelopathy or radiculopathy, lumbar region: Secondary | ICD-10-CM

## 2012-07-29 ENCOUNTER — Other Ambulatory Visit: Payer: Self-pay | Admitting: Cardiology

## 2012-08-29 ENCOUNTER — Ambulatory Visit (INDEPENDENT_AMBULATORY_CARE_PROVIDER_SITE_OTHER): Payer: Medicaid Other | Admitting: Cardiology

## 2012-08-29 ENCOUNTER — Encounter: Payer: Self-pay | Admitting: Cardiology

## 2012-08-29 VITALS — BP 125/85 | HR 87 | Ht 64.0 in | Wt 223.1 lb

## 2012-08-29 DIAGNOSIS — I10 Essential (primary) hypertension: Secondary | ICD-10-CM

## 2012-08-29 DIAGNOSIS — I471 Supraventricular tachycardia: Secondary | ICD-10-CM

## 2012-08-29 DIAGNOSIS — I2510 Atherosclerotic heart disease of native coronary artery without angina pectoris: Secondary | ICD-10-CM

## 2012-08-29 NOTE — Assessment & Plan Note (Signed)
Blood pressure looks reasonable today. 

## 2012-08-29 NOTE — Patient Instructions (Addendum)
Your physician recommends that you schedule a follow-up appointment in: 6 month with Dr. Diona Browner  Continue your same medications. Refer to the list given to you today. You have a refill of Metoprolol 50 MG at [facility] ready for you to pick up.

## 2012-08-29 NOTE — Assessment & Plan Note (Signed)
History of mild nonobstructive atherosclerosis in 2010. Her chest pain symptoms are atypical, likely noncardiac. Keep follow up with primary care for risk factor modification. Consider low-dose aspirin, followup lipids with eye toward statin if LDL over 100.

## 2012-08-29 NOTE — Progress Notes (Signed)
Clinical Summary Ms. Homeyer is a 66 y.o.female presenting for followup. She was seen in June. She reports no major change in frequency or intensity of palpitations, no syncope. 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 She reports compliance with her medications including beta blocker.  Carotid Dopplers from June of this year demonstrated 0-39% bilateral ICA stenoses.  Does report fairly chronic, atypical left-sided chest pain, describes a soreness in her chest wall. Nonexertional. She states she is due for followup with her primary care provider.  Allergies  Allergen Reactions  . Sulfonamide Derivatives     REACTION: hives    Current Outpatient Prescriptions  Medication Sig Dispense Refill  . amitriptyline (ELAVIL) 75 MG tablet Take 75 mg by mouth at bedtime.       [name] estrogens, conjugated, (PREMARIN) 0.625 MG tablet Take 0.625 mg by mouth daily. Take daily for 21 days then do not take for 7 days.       . hydrochlorothiazide (HYDRODIURIL) 25 MG tablet Take 25 mg by mouth daily.       [name] HYDROcodone-acetaminophen (NORCO/VICODIN) 5-325 MG per tablet Take 1 tablet by mouth every 4 (four) hours as needed.      [name] ipratropium-albuterol (DUONEB) 0.5-2.5 (3) MG/3ML SOLN 3 mLs every 6 (six) hours as needed.       [name] LORazepam (ATIVAN) 2 MG tablet Take 2 mg by mouth as directed. Take1 tablets in AM and 1 tablet around noon if needed and 2 tablets at bedtime      . metoprolol succinate (TOPROL-XL) 50 MG 24 hr tablet Take 50 mg by mouth daily. Take with or immediately following a meal.      . pantoprazole (PROTONIX) 40 MG tablet Take 40 mg by mouth 2 (two) times daily.       [name] PROAIR HFA 108 (90 BASE) MCG/ACT inhaler Inhale 2 puffs into the lungs every 6 (six) hours as needed.       . sertraline (ZOLOFT) 100 MG tablet Take 100 mg by mouth daily.          Past Medical History  Diagnosis Date  . Coronary atherosclerosis of native coronary artery     Mild atherosclerosis  3/10, LVEF 60-65%  . Depression   . GERD (gastroesophageal reflux disease)   . Diverticulosis   . Anxiety   . Chronic lung disease     Fibrosis - Dr. Orson Aloe  . Essential hypertension, benign   . PSVT (paroxysmal supraventricular tachycardia)     Social History Ms. Canevari reports that she has never smoked. She has never used smokeless tobacco. Ms. Meenan reports that she does not drink alcohol.  Review of Systems States her mood has been somewhat down, otherwise no major change. No hospitalizations. No reported bleeding episodes. Otherwise negative.  Physical Examination Filed Vitals:   08/29/12 0900  BP: 125/85  Pulse: 87   Filed Weights   08/29/12 0900  Weight: 223 lb 1.9 oz (101.207 kg)    Obese woman in no acute distress.  HEENT: Conjunctiva and lids normal, oropharynx with moist mucosa.  Neck: Supple, no elevated JVP or carotid bruits.  Lungs: Clear to auscultation, nonlabored.  Cardiac: Regular rate and rhythm, no S3 gallop or rub.  Abdomen: Soft, nontender, bowel sounds present.  Skin: Warm and dry.  Musculoskeletal: No kyphosis.  Extremities: No pitting edema, distal pulses full.  Neuropsychiatric: Alert and oriented x3, affect grossly normal.   Problem List and Plan   PSVT (paroxysmal supraventricular tachycardia)  No major progression in frequency or intensity of palpitations. Plan to continue beta blocker and observation for now.  CORONARY ATHEROSCLEROSIS NATIVE CORONARY ARTERY History of mild nonobstructive atherosclerosis in 2010. Her chest pain symptoms are atypical, likely noncardiac. Keep follow up with primary care for risk factor modification. Consider low-dose aspirin, followup lipids with eye toward statin if LDL over 100.  Essential hypertension, benign Blood pressure looks reasonable today.    Jonelle Sidle, M.D., F.A.C.C.

## 2012-08-29 NOTE — Assessment & Plan Note (Signed)
No major progression in frequency or intensity of palpitations. Plan to continue beta blocker and observation for now.

## 2012-09-25 ENCOUNTER — Encounter: Payer: Self-pay | Admitting: Cardiology

## 2013-02-06 ENCOUNTER — Encounter (INDEPENDENT_AMBULATORY_CARE_PROVIDER_SITE_OTHER): Payer: Medicare Other | Admitting: Ophthalmology

## 2013-02-06 DIAGNOSIS — I10 Essential (primary) hypertension: Secondary | ICD-10-CM

## 2013-02-06 DIAGNOSIS — H35039 Hypertensive retinopathy, unspecified eye: Secondary | ICD-10-CM

## 2013-02-06 DIAGNOSIS — H2510 Age-related nuclear cataract, unspecified eye: Secondary | ICD-10-CM

## 2013-02-06 DIAGNOSIS — H43819 Vitreous degeneration, unspecified eye: Secondary | ICD-10-CM

## 2013-02-06 DIAGNOSIS — H3530 Unspecified macular degeneration: Secondary | ICD-10-CM

## 2013-03-01 ENCOUNTER — Ambulatory Visit: Payer: Medicare Other | Admitting: Physician Assistant

## 2013-03-14 ENCOUNTER — Ambulatory Visit: Payer: Medicaid Other | Admitting: Diagnostic Neuroimaging

## 2013-05-21 ENCOUNTER — Other Ambulatory Visit: Payer: Self-pay | Admitting: *Deleted

## 2013-05-21 DIAGNOSIS — I679 Cerebrovascular disease, unspecified: Secondary | ICD-10-CM

## 2013-05-31 ENCOUNTER — Encounter (INDEPENDENT_AMBULATORY_CARE_PROVIDER_SITE_OTHER): Payer: Medicare Other

## 2013-05-31 DIAGNOSIS — I679 Cerebrovascular disease, unspecified: Secondary | ICD-10-CM

## 2013-05-31 DIAGNOSIS — I6529 Occlusion and stenosis of unspecified carotid artery: Secondary | ICD-10-CM

## 2013-06-04 ENCOUNTER — Encounter: Payer: Self-pay | Admitting: Cardiology

## 2013-06-04 ENCOUNTER — Ambulatory Visit: Payer: Medicare Other | Admitting: Cardiology

## 2013-06-04 ENCOUNTER — Telehealth: Payer: Self-pay | Admitting: Cardiology

## 2013-06-04 ENCOUNTER — Ambulatory Visit (INDEPENDENT_AMBULATORY_CARE_PROVIDER_SITE_OTHER): Payer: Medicare Other | Admitting: Cardiology

## 2013-06-04 VITALS — BP 124/90 | HR 83 | Ht 65.0 in | Wt 224.0 lb

## 2013-06-04 DIAGNOSIS — E782 Mixed hyperlipidemia: Secondary | ICD-10-CM

## 2013-06-04 DIAGNOSIS — I10 Essential (primary) hypertension: Secondary | ICD-10-CM

## 2013-06-04 DIAGNOSIS — I2510 Atherosclerotic heart disease of native coronary artery without angina pectoris: Secondary | ICD-10-CM

## 2013-06-04 DIAGNOSIS — I779 Disorder of arteries and arterioles, unspecified: Secondary | ICD-10-CM

## 2013-06-04 MED ORDER — ROSUVASTATIN CALCIUM 5 MG PO TABS: 5.0000 mg | ORAL_TABLET | Freq: Every day | ORAL | Status: DC

## 2013-06-04 NOTE — Telephone Encounter (Signed)
Discussed results with pt at OV

## 2013-06-04 NOTE — Assessment & Plan Note (Signed)
Stable mild disease by recent Dopplers.

## 2013-06-04 NOTE — Assessment & Plan Note (Signed)
Blood pressure control is good today. 

## 2013-06-04 NOTE — Telephone Encounter (Signed)
Message copied by Burnice Logan on Mon Jun 04, 2013  1:29 PM ------      Message from: MCDOWELL, Illene Bolus      Created: Mon Jun 04, 2013  9:40 AM       Stable, mild bilateral ICA stenosis no more than 39%. ------

## 2013-06-04 NOTE — Assessment & Plan Note (Signed)
We will add Crestor 5 mg daily, followup FLP and LFT for next visit.

## 2013-06-04 NOTE — Assessment & Plan Note (Signed)
History of mild nonobstructive disease, symptomatically stable. Continue medical therapy and observation.

## 2013-06-04 NOTE — Patient Instructions (Signed)
   Begin Crestor 5mg  daily - new sent to pharm  Continue all other medications.   Labs for cholesterol due just prior to next office visit Your physician wants you to follow up in: 6 months.  You will receive a reminder letter in the mail one-two months in advance.  If you don't receive a letter, please call our office to schedule the follow up appointment

## 2013-06-04 NOTE — Progress Notes (Signed)
Clinical Summary Michelle Zavala is a 67 y.o.female last seen in December 2013. She reports no progressive angina symptoms or shortness of breath. States she has had trouble with back pain, arthritic pain, had a fall after tripping a few months ago resulting in need for stitches in her forehead, also problems with her "kidneys."  She states that she has been taking her medications as outlined. Lab work from January showed BUN 16, creatinine 0.7, normal AST and ALT, Douglas rise to 23, cholesterol 280, HDL 62, LDL 173. She states she was previously on Crestor, tolerated this.  Recent carotid Dopplers showed mild bilateral ICA stenoses no greater than 39%. Medical therapy recommended.  Allergies  Allergen Reactions  . Sulfonamide Derivatives     REACTION: hives    Current Outpatient Prescriptions  Medication Sig Dispense Refill  . amitriptyline (ELAVIL) 75 MG tablet Take 75 mg by mouth at bedtime.       . budesonide-formoterol (SYMBICORT) 160-4.5 MCG/ACT inhaler Inhale 2 puffs into the lungs 2 (two) times daily.      . celecoxib (CELEBREX) 200 MG capsule Take 200 mg by mouth daily.      . cholestyramine (QUESTRAN) 4 GM/DOSE powder Take 1 g by mouth 2 (two) times daily with a meal.       . fluticasone (FLONASE) 50 MCG/ACT nasal spray Place 1 spray into the nose as needed.       . hydrochlorothiazide (HYDRODIURIL) 25 MG tablet Take 25 mg by mouth daily.       [name] HYDROcodone-acetaminophen (NORCO/VICODIN) 5-325 MG per tablet Take 1 tablet by mouth every 4 (four) hours as needed.      . hyoscyamine (LEVSIN, ANASPAZ) 0.125 MG tablet Take 0.125 mg by mouth every 4 (four) hours as needed for cramping.      [name] ipratropium-albuterol (DUONEB) 0.5-2.5 (3) MG/3ML SOLN 3 mLs every 6 (six) hours as needed.       [name] LORazepam (ATIVAN) 2 MG tablet Take 2 mg by mouth as directed. Take1 tablets in AM and 1 tablet around noon if needed and 2 tablets at bedtime      . metoprolol succinate (TOPROL-XL) 50 MG 24 hr  tablet Take 50 mg by mouth daily. Take with or immediately following a meal.      . pantoprazole (PROTONIX) 40 MG tablet Take 40 mg by mouth 2 (two) times daily.       [name] PROAIR HFA 108 (90 BASE) MCG/ACT inhaler Inhale 2 puffs into the lungs every 6 (six) hours as needed.       [name] rOPINIRole (REQUIP) 1 MG tablet Take 1 mg by mouth at bedtime.      . sertraline (ZOLOFT) 100 MG tablet Take 100 mg by mouth daily.        [name] tiZANidine (ZANAFLEX) 4 MG tablet Take 4 mg by mouth every 8 (eight) hours as needed.       . Vitamin D, Ergocalciferol, (DRISDOL) 50000 UNITS CAPS capsule Take 50,000 Units by mouth 2 (two) times a week.      . rosuvastatin (CRESTOR) 5 MG tablet Take 1 tablet (5 mg total) by mouth daily.  30 tablet  6   No current facility-administered medications for this visit.    Past Medical History  Diagnosis Date  . Coronary atherosclerosis of native coronary artery     Mild atherosclerosis 3/10, LVEF 60-65%  . Depression   . GERD (gastroesophageal reflux disease)   . Diverticulosis   . Anxiety   .  Chronic lung disease     Fibrosis - Dr. Orson Aloe  . Essential hypertension, benign   . PSVT (paroxysmal supraventricular tachycardia)     Social History Michelle Zavala reports that she has never smoked. She has never used smokeless tobacco. Michelle Zavala reports that she does not drink alcohol.  Review of Systems As outlined above, otherwise negative.  Physical Examination Filed Vitals:   06/04/13 1146  BP: 124/90  Pulse: 83   Filed Weights   06/04/13 1146  Weight: 224 lb (101.606 kg)    Obese woman, appears comfortable at rest.  HEENT: Conjunctiva and lids normal, oropharynx with moist mucosa.  Neck: Supple, no elevated JVP or carotid bruits.  Lungs: Clear to auscultation, nonlabored.  Cardiac: Regular rate and rhythm, no S3 gallop or rub.  Abdomen: Soft, nontender, bowel sounds present.  Skin: Warm and dry.  Musculoskeletal: No kyphosis.  Extremities: No pitting edema,  distal pulses full.  Neuropsychiatric: Alert and oriented x3, affect grossly normal.   Problem List and Plan   CORONARY ATHEROSCLEROSIS NATIVE CORONARY ARTERY History of mild nonobstructive disease, symptomatically stable. Continue medical therapy and observation.  Bilateral carotid artery disease Stable mild disease by recent Dopplers.  Essential hypertension, benign Blood pressure control is good today.  Mixed hyperlipidemia We will add Crestor 5 mg daily, followup FLP and LFT for next visit.    Jonelle Sidle, M.D., F.A.C.C.

## 2013-09-17 ENCOUNTER — Emergency Department (HOSPITAL_COMMUNITY): Payer: No Typology Code available for payment source

## 2013-09-17 ENCOUNTER — Encounter (HOSPITAL_COMMUNITY): Payer: Self-pay | Admitting: Emergency Medicine

## 2013-09-17 ENCOUNTER — Inpatient Hospital Stay (HOSPITAL_COMMUNITY)
Admission: EM | Admit: 2013-09-17 | Discharge: 2013-09-21 | DRG: 184 | Disposition: A | Discharge status: Rehabilitation Facility | Payer: No Typology Code available for payment source | Attending: General Surgery | Admitting: General Surgery

## 2013-09-17 DIAGNOSIS — S129XXA Fracture of neck, unspecified, initial encounter: Secondary | ICD-10-CM

## 2013-09-17 DIAGNOSIS — J984 Other disorders of lung: Secondary | ICD-10-CM | POA: Diagnosis present

## 2013-09-17 DIAGNOSIS — K219 Gastro-esophageal reflux disease without esophagitis: Secondary | ICD-10-CM | POA: Diagnosis present

## 2013-09-17 DIAGNOSIS — I658 Occlusion and stenosis of other precerebral arteries: Secondary | ICD-10-CM | POA: Diagnosis present

## 2013-09-17 DIAGNOSIS — Z96659 Presence of unspecified artificial knee joint: Secondary | ICD-10-CM

## 2013-09-17 DIAGNOSIS — S22009A Unspecified fracture of unspecified thoracic vertebra, initial encounter for closed fracture: Secondary | ICD-10-CM | POA: Diagnosis present

## 2013-09-17 DIAGNOSIS — S12100A Unspecified displaced fracture of second cervical vertebra, initial encounter for closed fracture: Secondary | ICD-10-CM

## 2013-09-17 DIAGNOSIS — K589 Irritable bowel syndrome without diarrhea: Secondary | ICD-10-CM | POA: Diagnosis present

## 2013-09-17 DIAGNOSIS — I726 Aneurysm of vertebral artery: Secondary | ICD-10-CM | POA: Diagnosis present

## 2013-09-17 DIAGNOSIS — I10 Essential (primary) hypertension: Secondary | ICD-10-CM | POA: Diagnosis present

## 2013-09-17 DIAGNOSIS — F329 Major depressive disorder, single episode, unspecified: Secondary | ICD-10-CM | POA: Diagnosis present

## 2013-09-17 DIAGNOSIS — F411 Generalized anxiety disorder: Secondary | ICD-10-CM | POA: Diagnosis present

## 2013-09-17 DIAGNOSIS — S2249XA Multiple fractures of ribs, unspecified side, initial encounter for closed fracture: Secondary | ICD-10-CM

## 2013-09-17 DIAGNOSIS — K560 Paralytic ileus: Secondary | ICD-10-CM | POA: Diagnosis not present

## 2013-09-17 DIAGNOSIS — E876 Hypokalemia: Secondary | ICD-10-CM | POA: Diagnosis present

## 2013-09-17 DIAGNOSIS — I2510 Atherosclerotic heart disease of native coronary artery without angina pectoris: Secondary | ICD-10-CM | POA: Diagnosis present

## 2013-09-17 DIAGNOSIS — Z79899 Other long term (current) drug therapy: Secondary | ICD-10-CM

## 2013-09-17 DIAGNOSIS — Z6839 Body mass index (BMI) 39.0-39.9, adult: Secondary | ICD-10-CM

## 2013-09-17 DIAGNOSIS — J449 Chronic obstructive pulmonary disease, unspecified: Secondary | ICD-10-CM | POA: Diagnosis present

## 2013-09-17 DIAGNOSIS — I728 Aneurysm of other specified arteries: Secondary | ICD-10-CM | POA: Diagnosis present

## 2013-09-17 DIAGNOSIS — F1320 Sedative, hypnotic or anxiolytic dependence, uncomplicated: Secondary | ICD-10-CM | POA: Diagnosis not present

## 2013-09-17 DIAGNOSIS — D62 Acute posthemorrhagic anemia: Secondary | ICD-10-CM | POA: Diagnosis not present

## 2013-09-17 DIAGNOSIS — J4489 Other specified chronic obstructive pulmonary disease: Secondary | ICD-10-CM | POA: Diagnosis present

## 2013-09-17 DIAGNOSIS — F3289 Other specified depressive episodes: Secondary | ICD-10-CM | POA: Diagnosis present

## 2013-09-17 DIAGNOSIS — S22081A Stable burst fracture of T11-T12 vertebra, initial encounter for closed fracture: Secondary | ICD-10-CM

## 2013-09-17 DIAGNOSIS — F19939 Other psychoactive substance use, unspecified with withdrawal, unspecified: Secondary | ICD-10-CM | POA: Diagnosis not present

## 2013-09-17 DIAGNOSIS — E782 Mixed hyperlipidemia: Secondary | ICD-10-CM | POA: Diagnosis present

## 2013-09-17 DIAGNOSIS — S22000A Wedge compression fracture of unspecified thoracic vertebra, initial encounter for closed fracture: Secondary | ICD-10-CM | POA: Insufficient documentation

## 2013-09-17 DIAGNOSIS — I6529 Occlusion and stenosis of unspecified carotid artery: Secondary | ICD-10-CM | POA: Diagnosis present

## 2013-09-17 HISTORY — DX: Unspecified displaced fracture of second cervical vertebra, initial encounter for closed fracture: S12.100A

## 2013-09-17 LAB — URINALYSIS, ROUTINE W REFLEX MICROSCOPIC
Bilirubin Urine: NEGATIVE
Glucose, UA: NEGATIVE mg/dL
Hgb urine dipstick: NEGATIVE
Ketones, ur: NEGATIVE mg/dL
Leukocytes, UA: NEGATIVE
Nitrite: NEGATIVE
Protein, ur: NEGATIVE mg/dL
Specific Gravity, Urine: >1.046 — ABNORMAL HIGH (ref 1.005–1.030)
Urobilinogen, UA: 0.2 mg/dL (ref 0.0–1.0)
pH: 5.5 (ref 5.0–8.0)

## 2013-09-17 LAB — COMPREHENSIVE METABOLIC PANEL
ALT: 43 U/L — ABNORMAL HIGH (ref 0–35)
AST: 52 U/L — ABNORMAL HIGH (ref 0–37)
Albumin: 3.6 g/dL (ref 3.5–5.2)
Alkaline Phosphatase: 53 U/L (ref 39–117)
BUN: 15 mg/dL (ref 6–23)
CO2: 27 mEq/L (ref 19–32)
Calcium: 8.5 mg/dL (ref 8.4–10.5)
Chloride: 100 mEq/L (ref 96–112)
Creatinine, Ser: 0.84 mg/dL (ref 0.50–1.10)
GFR calc Af Amer: 82 mL/min — ABNORMAL LOW (ref >90)
GFR calc non Af Amer: 70 mL/min — ABNORMAL LOW (ref >90)
Glucose, Bld: 143 mg/dL — ABNORMAL HIGH (ref 70–99)
Potassium: 2.8 mEq/L — CL (ref 3.7–5.3)
Sodium: 142 mEq/L (ref 137–147)
Total Bilirubin: 0.5 mg/dL (ref 0.3–1.2)
Total Protein: 6.9 g/dL (ref 6.0–8.3)

## 2013-09-17 LAB — POCT I-STAT, CHEM 8
BUN: 15 mg/dL (ref 6–23)
Calcium, Ion: 1.12 mmol/L — ABNORMAL LOW (ref 1.13–1.30)
Chloride: 101 mEq/L (ref 96–112)
Creatinine, Ser: 0.9 mg/dL (ref 0.50–1.10)
Glucose, Bld: 145 mg/dL — ABNORMAL HIGH (ref 70–99)
HCT: 38 % (ref 36.0–46.0)
Hemoglobin: 12.9 g/dL (ref 12.0–15.0)
Potassium: 2.7 mEq/L — CL (ref 3.7–5.3)
Sodium: 142 mEq/L (ref 137–147)
TCO2: 26 mmol/L (ref 0–100)

## 2013-09-17 LAB — CBC
HCT: 36.4 % (ref 36.0–46.0)
Hemoglobin: 12.8 g/dL (ref 12.0–15.0)
MCH: 31.1 pg (ref 26.0–34.0)
MCHC: 35.2 g/dL (ref 30.0–36.0)
MCV: 88.3 fL (ref 78.0–100.0)
Platelets: 182 10*3/uL (ref 150–400)
RBC: 4.12 MIL/uL (ref 3.87–5.11)
RDW: 13.9 % (ref 11.5–15.5)
WBC: 13.7 10*3/uL — ABNORMAL HIGH (ref 4.0–10.5)

## 2013-09-17 LAB — SAMPLE TO BLOOD BANK

## 2013-09-17 LAB — CG4 I-STAT (LACTIC ACID): Lactic Acid, Venous: 2.24 mmol/L — ABNORMAL HIGH (ref 0.5–2.2)

## 2013-09-17 LAB — PROTIME-INR
INR: 1.03 (ref 0.00–1.49)
Prothrombin Time: 13.3 seconds (ref 11.6–15.2)

## 2013-09-17 LAB — CDS SEROLOGY

## 2013-09-17 MED ORDER — IOHEXOL 300 MG/ML  SOLN
100.0000 mL | Freq: Once | INTRAMUSCULAR | Status: AC | PRN
  Administered 2013-09-17: 100 mL via INTRAVENOUS

## 2013-09-17 MED ORDER — ENOXAPARIN SODIUM 40 MG/0.4ML ~~LOC~~ SOLN
40.0000 mg | SUBCUTANEOUS | Status: DC
  Administered 2013-09-17 – 2013-09-21 (×5): 40 mg via SUBCUTANEOUS
  Filled 2013-09-17 (×5): qty 0.4

## 2013-09-17 MED ORDER — LORAZEPAM 1 MG PO TABS
2.0000 mg | ORAL_TABLET | ORAL | Status: DC
  Administered 2013-09-20: 2 mg via ORAL
  Filled 2013-09-17 (×2): qty 2

## 2013-09-17 MED ORDER — BUDESONIDE-FORMOTEROL FUMARATE 160-4.5 MCG/ACT IN AERO
2.0000 | INHALATION_SPRAY | Freq: Two times a day (BID) | RESPIRATORY_TRACT | Status: DC
  Administered 2013-09-17 – 2013-09-21 (×6): 2 via RESPIRATORY_TRACT
  Filled 2013-09-17 (×2): qty 6

## 2013-09-17 MED ORDER — ONDANSETRON HCL 4 MG/2ML IJ SOLN
4.0000 mg | Freq: Four times a day (QID) | INTRAMUSCULAR | Status: DC | PRN
Start: 2013-09-17 — End: 2013-09-21
  Administered 2013-09-17 – 2013-09-19 (×7): 4 mg via INTRAVENOUS
  Filled 2013-09-17 (×7): qty 2

## 2013-09-17 MED ORDER — ROPINIROLE HCL 1 MG PO TABS
1.0000 mg | ORAL_TABLET | Freq: Every day | ORAL | Status: DC
  Administered 2013-09-18 – 2013-09-20 (×4): 1 mg via ORAL
  Filled 2013-09-17 (×8): qty 1

## 2013-09-17 MED ORDER — ONDANSETRON HCL 4 MG PO TABS: 4.0000 mg | ORAL_TABLET | Freq: Four times a day (QID) | ORAL | Status: DC | PRN

## 2013-09-17 MED ORDER — METOPROLOL SUCCINATE ER 50 MG PO TB24
50.0000 mg | ORAL_TABLET | Freq: Every day | ORAL | Status: DC
  Administered 2013-09-17 – 2013-09-21 (×5): 50 mg via ORAL
  Filled 2013-09-17 (×6): qty 1

## 2013-09-17 MED ORDER — PANTOPRAZOLE SODIUM 40 MG PO TBEC: 40.0000 mg | DELAYED_RELEASE_TABLET | Freq: Every day | ORAL | Status: DC

## 2013-09-17 MED ORDER — ALBUTEROL SULFATE HFA 108 (90 BASE) MCG/ACT IN AERS: 2.0000 | INHALATION_SPRAY | Freq: Four times a day (QID) | RESPIRATORY_TRACT | Status: DC | PRN

## 2013-09-17 MED ORDER — POTASSIUM CHLORIDE 10 MEQ/100ML IV SOLN
10.0000 meq | Freq: Once | INTRAVENOUS | Status: AC
Start: 2013-09-17 — End: 2013-09-17
  Administered 2013-09-17: 10 meq via INTRAVENOUS
  Filled 2013-09-17: qty 100

## 2013-09-17 MED ORDER — MORPHINE SULFATE 4 MG/ML IJ SOLN
4.0000 mg | Freq: Once | INTRAMUSCULAR | Status: AC
  Administered 2013-09-17: 4 mg via INTRAVENOUS
  Filled 2013-09-17: qty 1

## 2013-09-17 MED ORDER — FLUTICASONE PROPIONATE 50 MCG/ACT NA SUSP
1.0000 | Freq: Every day | NASAL | Status: DC | PRN
  Filled 2013-09-17: qty 16

## 2013-09-17 MED ORDER — IPRATROPIUM-ALBUTEROL 0.5-2.5 (3) MG/3ML IN SOLN: 3.0000 mL | Freq: Four times a day (QID) | RESPIRATORY_TRACT | Status: DC | PRN

## 2013-09-17 MED ORDER — TIZANIDINE HCL 4 MG PO TABS
4.0000 mg | ORAL_TABLET | Freq: Three times a day (TID) | ORAL | Status: DC | PRN
Start: 2013-09-17 — End: 2013-09-18
  Filled 2013-09-17: qty 1

## 2013-09-17 MED ORDER — HYOSCYAMINE SULFATE 0.125 MG PO TABS
0.1250 mg | ORAL_TABLET | ORAL | Status: DC | PRN
  Filled 2013-09-17: qty 1

## 2013-09-17 MED ORDER — OXYCODONE HCL 5 MG PO TABS
10.0000 mg | ORAL_TABLET | ORAL | Status: DC | PRN
  Administered 2013-09-18 – 2013-09-21 (×5): 10 mg via ORAL
  Administered 2013-09-21 (×2): 5 mg via ORAL
  Filled 2013-09-17 (×7): qty 2

## 2013-09-17 MED ORDER — PANTOPRAZOLE SODIUM 40 MG PO TBEC
40.0000 mg | DELAYED_RELEASE_TABLET | Freq: Two times a day (BID) | ORAL | Status: DC
  Administered 2013-09-18 – 2013-09-21 (×7): 40 mg via ORAL
  Filled 2013-09-17 (×8): qty 1

## 2013-09-17 MED ORDER — ONDANSETRON HCL 4 MG/2ML IJ SOLN
4.0000 mg | Freq: Once | INTRAMUSCULAR | Status: AC
  Administered 2013-09-17: 4 mg via INTRAVENOUS
  Filled 2013-09-17: qty 2

## 2013-09-17 MED ORDER — HYDROCHLOROTHIAZIDE 25 MG PO TABS
25.0000 mg | ORAL_TABLET | Freq: Every day | ORAL | Status: DC
  Administered 2013-09-17 – 2013-09-21 (×5): 25 mg via ORAL
  Filled 2013-09-17 (×7): qty 1

## 2013-09-17 MED ORDER — POTASSIUM CHLORIDE CRYS ER 20 MEQ PO TBCR
40.0000 meq | EXTENDED_RELEASE_TABLET | Freq: Once | ORAL | Status: AC
  Administered 2013-09-17: 40 meq via ORAL
  Filled 2013-09-17: qty 2

## 2013-09-17 MED ORDER — SERTRALINE HCL 100 MG PO TABS
100.0000 mg | ORAL_TABLET | Freq: Every day | ORAL | Status: DC
  Administered 2013-09-17 – 2013-09-21 (×5): 100 mg via ORAL
  Filled 2013-09-17 (×5): qty 1
  Filled 2013-09-17: qty 2

## 2013-09-17 MED ORDER — IOHEXOL 350 MG/ML SOLN
80.0000 mL | Freq: Once | INTRAVENOUS | Status: AC | PRN
  Administered 2013-09-17: 80 mL via INTRAVENOUS

## 2013-09-17 MED ORDER — PANTOPRAZOLE SODIUM 40 MG IV SOLR
40.0000 mg | Freq: Every day | INTRAVENOUS | Status: DC
Start: 2013-09-17 — End: 2013-09-18
  Administered 2013-09-17: 40 mg via INTRAVENOUS
  Filled 2013-09-17 (×2): qty 40

## 2013-09-17 MED ORDER — HYDROMORPHONE HCL PF 1 MG/ML IJ SOLN
0.5000 mg | INTRAMUSCULAR | Status: DC | PRN
  Administered 2013-09-17 – 2013-09-19 (×15): 1 mg via INTRAVENOUS
  Filled 2013-09-17 (×16): qty 1

## 2013-09-17 MED ORDER — AMITRIPTYLINE HCL 75 MG PO TABS
75.0000 mg | ORAL_TABLET | Freq: Every day | ORAL | Status: DC
  Administered 2013-09-18 – 2013-09-20 (×4): 75 mg via ORAL
  Filled 2013-09-17 (×6): qty 1

## 2013-09-17 MED ORDER — CHOLESTYRAMINE 4 GM/DOSE PO POWD
2.0000 g | Freq: Two times a day (BID) | ORAL | Status: DC
  Filled 2013-09-17 (×18): qty 0.5

## 2013-09-17 MED ORDER — POTASSIUM CHLORIDE IN NACL 20-0.9 MEQ/L-% IV SOLN
INTRAVENOUS | Status: DC
  Administered 2013-09-17 – 2013-09-20 (×6): via INTRAVENOUS
  Filled 2013-09-17 (×13): qty 1000

## 2013-09-17 NOTE — ED Notes (Signed)
istat lactic acid shown to Dr Eulis Foster

## 2013-09-17 NOTE — Progress Notes (Signed)
Orthopedic Tech Progress Note Patient Details:  [name] August 16, 1946 [id number]  Patient ID: Michelle Zavala, female   DOB: [birth year], 68 y.o.   MRN: [id number]   [name] 09/17/2013, 11:42 AMCalled bio-tech for TLSO.
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
## 2013-09-17 NOTE — ED Notes (Signed)
Notified [facility]
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
## 2013-09-17 NOTE — ED Notes (Signed)
Pt states she doesn't take Questran until 2200...will hold and get pharmacy to change administration time

## 2013-09-17 NOTE — Consult Note (Signed)
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 collection within the peritoneal cavity or retroperitoneum to suggest posttraumatic hemorrhage. No evidence of acute posttraumatic dissection/transsection of the abdominal aorta or major abdominal and pelvic arterial branches. No significant volume of ascites. No pneumoperitoneum. No pathologic distention of small bowel. Numerous colonic diverticulae are present, most pronounced in the region of the sigmoid colon, without surrounding inflammatory changes to suggest an acute diverticulitis at this time. Tiny ventral hernia in the epigastric region containing a small amount of omental fat. Status post hysterectomy. Ovaries are not confidently identified may be surgically absent or atrophic. Urinary bladder is unremarkable in appearance.  Musculoskeletal: There is an acute burst type fracture of the T12 vertebral body with very mild retropulsion of the posterior aspect of the vertebral body. Central spinal canal remains preserved at this level measuring approximately 15 mm AP. There is also mild compression at T11, which appears to be chronic and is similar in retrospect compared to prior study 05/11/2013. There are no aggressive appearing  lytic or blastic lesions noted in the  visualized portions of the skeleton.  IMPRESSION: 1. Burst fracture of T11 without significant loss of height at this time. There is mild retropulsion of the fracture fragments, however, the central spinal canal appears preserved at this time measuring up to 15 mm AP. 2. Additionally, there are nondisplaced fractures of the anterior aspects of the right 5th through 7th ribs. No associated pneumothorax. 3. No evidence of significant acute traumatic injury to the solid or hollow viscera of the abdomen or pelvis. 4. Other than the nondisplaced right-sided rib fractures, there are no additional findings to suggest significant acute traumatic injury to the thorax. 5. Atherosclerosis, including left main and 2 vessel coronary artery disease. Please note that although the presence of coronary artery calcium documents the presence of coronary artery disease, the severity of this disease and any potential stenosis cannot be assessed on this non-gated CT examination. Assessment for potential risk factor modification, dietary therapy or pharmacologic therapy may be warranted, if clinically indicated. 6. Colonic diverticulosis without evidence to suggest acute diverticulitis at this time. 7. Probable hepatic steatosis. 8. Status post cholecystectomy. 9. Additional incidental findings, as above. These results were called by telephone at the time of interpretation on 09/17/2013 at 10:15 AM to [facility], who verbally acknowledged these results.   Electronically Signed   By: Vinnie Langton M.D.   On: 09/17/2013 10:16    Review of Systems  Musculoskeletal: Positive for back pain and neck pain.  Neurological: Negative for tingling and focal weakness.   Blood pressure 118/65, pulse 80, temperature 97.7 F (36.5 C), temperature source Oral, resp. rate 18, height 5' 5"  (1.651 m), weight 100.699 kg (222 lb), SpO2 94.00%. Physical Exam  Constitutional: She appears well-developed and  well-nourished.  Neck:  No ROM attempted.  Pt in ASpen collar which is rubbing the anterior chin.  Adjusted to relieve pressure of chin.  GI: Soft.  Musculoskeletal:  Flat on stretcher.  Attempts at elevation of head not tolerated beyond 10 degrees.   Strength in LEs 5/5 throughout.  DTR: 0 at knee and symmetric            +1 ankle and symmetric            +2 biceps, triceps and BR bilateral No focal weakness in UEs No sciatic tension signs attempted     Assessment/Plan: 1. Right C2 lateral mass fracture:  Aspen collar 2. T12 burst fracture without significant retropulsion and no neurologic compromise.  Mild compression T11 ?subacute:  TLSO ordered Pt will remain at bedrest until stabilized and comfortable.  May log roll q2 hrs.  Keep heels off bed. Bilateral PAS hose. Monitor abdomen for ileus acute compression fracture and trauma. 3. Right anterior nondisplaced rib fractures;  Encourage CDB and IS  VERNON,SHEILA M 09/17/2013, 1:36 PM   She has no significant canal compromise and posterior facets intact. Neurological exam intact LE. Plan Turtle TLSO body jacket to be worn for 8 to 12 weeks  24/7.   followup xrays every 2 wks and if there is canal compromise or change in exam then fusion 2 above and 2 levels below.

## 2013-09-17 NOTE — ED Notes (Signed)
istat chem 8 shown to Milo, Therapist, sports

## 2013-09-17 NOTE — ED Notes (Signed)
TLSO brace placed at bedside by ortho representative/will follow up tomorrow on unit to actually place the brace.

## 2013-09-17 NOTE — ED Provider Notes (Signed)
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 Arnoldo Morale from neurosurgery, who recommends CTA of the neck, hard collar for c-spine, and TLSO for T11 burst fracture.  He also recommends calling Dr. Lorin Mercy, who has performed neck surgery on the patient previously, and encourages that care be continued by Dr. Lorin Mercy.  11:18 AM Discussed the patient with  Dr. Hulen Skains, who will admit the patient.  Dr. Lorin Mercy will also follow.    Pending tests are: right femur, right shoulder, and UA.  Montine Circle, PA-C 09/17/13 1409
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
## 2013-09-18 DIAGNOSIS — E876 Hypokalemia: Secondary | ICD-10-CM

## 2013-09-18 DIAGNOSIS — K560 Paralytic ileus: Secondary | ICD-10-CM

## 2013-09-18 LAB — CBC
HCT: 35.2 % — ABNORMAL LOW (ref 36.0–46.0)
Hemoglobin: 11.9 g/dL — ABNORMAL LOW (ref 12.0–15.0)
MCH: 30.5 pg (ref 26.0–34.0)
MCHC: 33.8 g/dL (ref 30.0–36.0)
MCV: 90.3 fL (ref 78.0–100.0)
Platelets: 164 10*3/uL (ref 150–400)
RBC: 3.9 MIL/uL (ref 3.87–5.11)
RDW: 14.8 % (ref 11.5–15.5)
WBC: 8 10*3/uL (ref 4.0–10.5)

## 2013-09-18 LAB — BASIC METABOLIC PANEL
BUN: 10 mg/dL (ref 6–23)
CO2: 25 mEq/L (ref 19–32)
Calcium: 7.6 mg/dL — ABNORMAL LOW (ref 8.4–10.5)
Chloride: 105 mEq/L (ref 96–112)
Creatinine, Ser: 0.67 mg/dL (ref 0.50–1.10)
GFR calc Af Amer: >90 mL/min (ref >90)
GFR calc non Af Amer: 89 mL/min — ABNORMAL LOW (ref >90)
Glucose, Bld: 153 mg/dL — ABNORMAL HIGH (ref 70–99)
Potassium: 3.1 mEq/L — ABNORMAL LOW (ref 3.7–5.3)
Sodium: 143 mEq/L (ref 137–147)

## 2013-09-18 LAB — MRSA PCR SCREENING: MRSA by PCR: NEGATIVE

## 2013-09-18 MED ORDER — TRAMADOL HCL 50 MG PO TABS
50.0000 mg | ORAL_TABLET | Freq: Four times a day (QID) | ORAL | Status: DC
  Administered 2013-09-18 – 2013-09-21 (×12): 50 mg via ORAL
  Filled 2013-09-18 (×11): qty 1

## 2013-09-18 MED ORDER — METHOCARBAMOL 100 MG/ML IJ SOLN
1000.0000 mg | Freq: Four times a day (QID) | INTRAVENOUS | Status: DC | PRN
  Administered 2013-09-20 – 2013-09-21 (×2): 1000 mg via INTRAVENOUS
  Filled 2013-09-18 (×2): qty 10

## 2013-09-18 MED ORDER — CHOLESTYRAMINE 4 G PO PACK
2.0000 g | PACK | Freq: Two times a day (BID) | ORAL | Status: DC
  Administered 2013-09-18 – 2013-09-20 (×5): 2 g via ORAL
  Administered 2013-09-21: 10:00:00 via ORAL
  Filled 2013-09-18 (×9): qty 1

## 2013-09-18 MED ORDER — POTASSIUM CHLORIDE 10 MEQ/100ML IV SOLN
10.0000 meq | INTRAVENOUS | Status: AC
  Administered 2013-09-18 (×3): 10 meq via INTRAVENOUS
  Filled 2013-09-18 (×3): qty 100

## 2013-09-18 MED ORDER — PROMETHAZINE HCL 25 MG/ML IJ SOLN
12.5000 mg | INTRAMUSCULAR | Status: DC | PRN
  Administered 2013-09-18 (×3): 12.5 mg via INTRAVENOUS
  Filled 2013-09-18 (×3): qty 1
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
## 2013-09-18 NOTE — Progress Notes (Signed)
UR completed.  Exie Chrismer, RN BSN [facility] CCM Trauma/Neuro ICU Case Manager [phone]  

## 2013-09-18 NOTE — Progress Notes (Addendum)
Report given to Horris Latino, RN, all questions answered.  No complaints. Daughter, Gwenneth notified of patient's new room number.

## 2013-09-18 NOTE — Progress Notes (Signed)
Subjective:     Patient reports pain as mild.  Some nausea but no vomiting.  No flatus or BM Has TLSO and Aspen cervical collar.  Has been log rolled not out of bed yet. No leg pain, numbness or tingling.  Objective: Vital signs in last 24 hours: Temp:  [97.4 F (36.3 C)-98.3 F (36.8 C)] 98.3 F (36.8 C) (01/06 1210) Pulse Rate:  [76-87] 76 (01/06 1210) Resp:  [13-19] 15 (01/06 1210) BP: (113-130)/(59-84) 130/70 mmHg (01/06 1210) SpO2:  [90 %-98 %] 95 % (01/06 1210) Weight:  [108.2 kg (238 lb 8.6 oz)] 108.2 kg (238 lb 8.6 oz) (01/05 2352)  Intake/Output from previous day: 01/05 0701 - 01/06 0700 In: 100 [I.V.:100] Out: 1350 [Urine:1350] Intake/Output this shift: Total I/O In: 400 [I.V.:300; IV Piggyback:100] Out: 475 [Urine:475]   Recent Labs  09/17/13 0915 09/17/13 0921 09/18/13 0611  HGB 12.8 12.9 11.9*    Recent Labs  09/17/13 0915 09/17/13 0921 09/18/13 0611  WBC 13.7*  --  8.0  RBC 4.12  --  3.90  HCT 36.4 38.0 35.2*  PLT 182  --  164    Recent Labs  09/17/13 0915 09/17/13 0921 09/18/13 0611  NA 142 142 143  K 2.8* 2.7* 3.1*  CL 100 101 105  CO2 27  --  25  BUN 15 15 10   CREATININE 0.84 0.90 0.67  GLUCOSE 143* 145* 153*  CALCIUM 8.5  --  7.6*    Recent Labs  09/17/13 0915  INR 1.03   UEs and LEs: Neurovascular intact Sensation intact distally Intact pulses distally Brace fits appropriately ,  Aspen cervical collar tends to ride up on her chin and not offering much support.  Advised pt to adjust with chin in appropriate spot.  Assessment/Plan:     Up with therapy:  Will order PT consult for bed mobility, bed to chair transfers and ambulation.  TLSO and ASpen at all times. Anticipate slow progress.   abd with some distention and nausea.  On stool softeners and antinausea meds. Monitor for ileus.  Taniqua Issa M 09/18/2013, 2:15 PM

## 2013-09-19 DIAGNOSIS — R197 Diarrhea, unspecified: Secondary | ICD-10-CM

## 2013-09-19 LAB — BASIC METABOLIC PANEL
BUN: 8 mg/dL (ref 6–23)
CO2: 21 mEq/L (ref 19–32)
Calcium: 7.3 mg/dL — ABNORMAL LOW (ref 8.4–10.5)
Chloride: 104 mEq/L (ref 96–112)
Creatinine, Ser: 0.63 mg/dL (ref 0.50–1.10)
GFR calc Af Amer: >90 mL/min (ref >90)
GFR calc non Af Amer: >90 mL/min (ref >90)
Glucose, Bld: 132 mg/dL — ABNORMAL HIGH (ref 70–99)
Potassium: 3 mEq/L — ABNORMAL LOW (ref 3.7–5.3)
Sodium: 141 mEq/L (ref 137–147)

## 2013-09-19 LAB — CBC
HCT: 32 % — ABNORMAL LOW (ref 36.0–46.0)
Hemoglobin: 11.2 g/dL — ABNORMAL LOW (ref 12.0–15.0)
MCH: 31.5 pg (ref 26.0–34.0)
MCHC: 35 g/dL (ref 30.0–36.0)
MCV: 90.1 fL (ref 78.0–100.0)
Platelets: 137 10*3/uL — ABNORMAL LOW (ref 150–400)
RBC: 3.55 MIL/uL — ABNORMAL LOW (ref 3.87–5.11)
RDW: 14.7 % (ref 11.5–15.5)
WBC: 5.6 10*3/uL (ref 4.0–10.5)

## 2013-09-19 LAB — CLOSTRIDIUM DIFFICILE BY PCR: Toxigenic C. Difficile by PCR: NEGATIVE

## 2013-09-19 MED ORDER — ENSURE COMPLETE PO LIQD
237.0000 mL | Freq: Three times a day (TID) | ORAL | Status: DC
  Administered 2013-09-20: 237 mL via ORAL

## 2013-09-19 MED ORDER — POTASSIUM CHLORIDE 10 MEQ/100ML IV SOLN
10.0000 meq | INTRAVENOUS | Status: AC
  Administered 2013-09-19 (×4): 10 meq via INTRAVENOUS
  Filled 2013-09-19: qty 100

## 2013-09-19 NOTE — Progress Notes (Signed)
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 [phone] General Trauma PA Pager: [phone]   09/19/2013

## 2013-09-19 NOTE — Progress Notes (Signed)
Patient reports that she started to pass watery stools yesterday afternoon.  BM frequency has increased significantly throughout the night. Dr. Grandville Silos notified, stool sample collected and sent to lab for C-Dif PCR.  Awaiting results.

## 2013-09-19 NOTE — Progress Notes (Signed)
Rehab Admissions Coordinator Note:  Patient was screened by Retta Diones for appropriateness for an Inpatient Acute Rehab Consult.  At this time, we are recommending Inpatient Rehab consult.  Retta Diones 09/19/2013, 3:33 PM  I can be reached at [phone].

## 2013-09-19 NOTE — Progress Notes (Addendum)
Subjective:     Patient reports pain as 8 on 0-10 scale.    Objective: Vital signs in last 24 hours: Temp:  [97.6 F (36.4 C)-98.9 F (37.2 C)] 98.9 F (37.2 C) (01/07 0524) Pulse Rate:  [76-84] 76 (01/07 0524) Resp:  [15-21] 21 (01/07 0524) BP: (122-140)/(66-79) 138/70 mmHg (01/07 0524) SpO2:  [95 %-97 %] 96 % (01/07 0524) FiO2 (%):  [2 %] 2 % (01/06 1210)  Intake/Output from previous day: 01/06 0701 - 01/07 0700 In: 2840 [P.O.:240; I.V.:2300; IV Piggyback:300] Out: 1700 [Urine:1700] Intake/Output this shift:     Recent Labs  09/17/13 0915 09/17/13 0921 09/18/13 0611 09/19/13 0505  HGB 12.8 12.9 11.9* 11.2*    Recent Labs  09/18/13 0611 09/19/13 0505  WBC 8.0 5.6  RBC 3.90 3.55*  HCT 35.2* 32.0*  PLT 164 137*    Recent Labs  09/17/13 0915 09/17/13 0921 09/18/13 0611  NA 142 142 143  K 2.8* 2.7* 3.1*  CL 100 101 105  CO2 27  --  25  BUN 15 15 10   CREATININE 0.84 0.90 0.67  GLUCOSE 143* 145* 153*  CALCIUM 8.5  --  7.6*    Recent Labs  09/17/13 0915  INR 1.03    Neurologically intact  Assessment/Plan:     Up with therapy.  Having diarrhea  Hx of IBS , had some diarrhea 5 wks ago.  hgb stable  Needs to be logrolled and start therapy getting up.  Michelle Zavala C 09/19/2013, 7:21 AM
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
## 2013-09-19 NOTE — Progress Notes (Addendum)
Not much of an appetite, and not eating well.  Still having diarrhea.  Foley catheter in place.  Has good bowel sounds.  Looks uncomfortable with brace in place riding up her chest wall.  Can restart her [place] for diarrhea.  Discontinue her Foley.  C.diff is negative  This patient has been seen and I agree with the findings and treatment plan.  Kathryne Eriksson. Dahlia Bailiff, MD, [place] [phone] (pager) [phone] (direct pager) Trauma Surgeon
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
## 2013-09-20 DIAGNOSIS — S12100A Unspecified displaced fracture of second cervical vertebra, initial encounter for closed fracture: Secondary | ICD-10-CM

## 2013-09-20 DIAGNOSIS — S2249XA Multiple fractures of ribs, unspecified side, initial encounter for closed fracture: Principal | ICD-10-CM

## 2013-09-20 DIAGNOSIS — S22009A Unspecified fracture of unspecified thoracic vertebra, initial encounter for closed fracture: Secondary | ICD-10-CM

## 2013-09-20 LAB — CBC
HCT: 33.4 % — ABNORMAL LOW (ref 36.0–46.0)
Hemoglobin: 11 g/dL — ABNORMAL LOW (ref 12.0–15.0)
MCH: 30.5 pg (ref 26.0–34.0)
MCHC: 32.9 g/dL (ref 30.0–36.0)
MCV: 92.5 fL (ref 78.0–100.0)
Platelets: 149 10*3/uL — ABNORMAL LOW (ref 150–400)
RBC: 3.61 MIL/uL — ABNORMAL LOW (ref 3.87–5.11)
RDW: 15 % (ref 11.5–15.5)
WBC: 7.1 10*3/uL (ref 4.0–10.5)

## 2013-09-20 LAB — BASIC METABOLIC PANEL
BUN: 10 mg/dL (ref 6–23)
CO2: 25 mEq/L (ref 19–32)
Calcium: 7 mg/dL — ABNORMAL LOW (ref 8.4–10.5)
Chloride: 104 mEq/L (ref 96–112)
Creatinine, Ser: 0.63 mg/dL (ref 0.50–1.10)
GFR calc Af Amer: >90 mL/min (ref >90)
GFR calc non Af Amer: >90 mL/min (ref >90)
Glucose, Bld: 133 mg/dL — ABNORMAL HIGH (ref 70–99)
Potassium: 3.5 mEq/L — ABNORMAL LOW (ref 3.7–5.3)
Sodium: 141 mEq/L (ref 137–147)

## 2013-09-20 MED ORDER — ADULT MULTIVITAMIN W/MINERALS CH
1.0000 | ORAL_TABLET | Freq: Every day | ORAL | Status: DC
  Administered 2013-09-21: 1 via ORAL
  Filled 2013-09-20: qty 1

## 2013-09-20 MED ORDER — BOOST / RESOURCE BREEZE PO LIQD
1.0000 | Freq: Three times a day (TID) | ORAL | Status: DC
  Administered 2013-09-20: 1 via ORAL

## 2013-09-20 MED ORDER — BOOST / RESOURCE BREEZE PO LIQD: 1.0000 | Freq: Two times a day (BID) | ORAL | Status: DC

## 2013-09-20 MED ORDER — ENSURE PUDDING PO PUDG: 1.0000 | ORAL | Status: DC

## 2013-09-20 MED ORDER — POTASSIUM CHLORIDE 10 MEQ/100ML IV SOLN
10.0000 meq | INTRAVENOUS | Status: AC
  Administered 2013-09-20 (×2): 10 meq via INTRAVENOUS
  Filled 2013-09-20 (×2): qty 100

## 2013-09-20 NOTE — Progress Notes (Signed)
Subjective:     Patient reports pain as mild.    Objective: Vital signs in last 24 hours: Temp:  [97.8 F (36.6 C)-98.2 F (36.8 C)] 97.9 F (36.6 C) (01/08 0600) Pulse Rate:  [72-91] 78 (01/08 0600) Resp:  [16-18] 18 (01/08 0600) BP: (108-120)/(61-72) 120/72 mmHg (01/08 0600) SpO2:  [91 %-96 %] 94 % (01/08 0600)  Intake/Output from previous day: 01/07 0701 - 01/08 0700 In: 2820 [P.O.:120; I.V.:2300; IV Piggyback:400] Out: 800 [Urine:800] Intake/Output this shift:     Recent Labs  09/17/13 0915 09/17/13 0921 09/18/13 0611 09/19/13 0505 09/20/13 0540  HGB 12.8 12.9 11.9* 11.2* 11.0*    Recent Labs  09/19/13 0505 09/20/13 0540  WBC 5.6 7.1  RBC 3.55* 3.61*  HCT 32.0* 33.4*  PLT 137* 149*    Recent Labs  09/19/13 0505 09/20/13 0540  NA 141 141  K 3.0* 3.5*  CL 104 104  CO2 21 25  BUN 8 10  CREATININE 0.63 0.63  GLUCOSE 132* 133*  CALCIUM 7.3* 7.0*    Recent Labs  09/17/13 0915  INR 1.03    Neurologically intact  Assessment/Plan:     Up with therapy  Made it OOB to recliner. Diarrhea better.  ( hx of IBS).     Idriss Quackenbush C 09/20/2013, 7:53 AM
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
## 2013-09-20 NOTE — Progress Notes (Signed)
Patient is better today than yesterday.  Diarrhea improved.  CIR being considered.  This patient has been seen and I agree with the findings and treatment plan.  Kathryne Eriksson. Dahlia Bailiff, MD, [place] [phone] (pager) [phone] (direct pager) Trauma Surgeon
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
## 2013-09-21 ENCOUNTER — Inpatient Hospital Stay (HOSPITAL_COMMUNITY)
Admission: RE | Admit: 2013-09-21 | Discharge: 2013-09-28 | DRG: 945 | Disposition: A | Discharge status: Home Health | Payer: Medicare Other | Source: Intra-hospital | Attending: Physical Medicine & Rehabilitation | Admitting: Physical Medicine & Rehabilitation

## 2013-09-21 DIAGNOSIS — Z5189 Encounter for other specified aftercare: Secondary | ICD-10-CM | POA: Diagnosis present

## 2013-09-21 DIAGNOSIS — F411 Generalized anxiety disorder: Secondary | ICD-10-CM | POA: Diagnosis present

## 2013-09-21 DIAGNOSIS — Z96659 Presence of unspecified artificial knee joint: Secondary | ICD-10-CM | POA: Diagnosis not present

## 2013-09-21 DIAGNOSIS — J449 Chronic obstructive pulmonary disease, unspecified: Secondary | ICD-10-CM | POA: Diagnosis present

## 2013-09-21 DIAGNOSIS — S069X0A Unspecified intracranial injury without loss of consciousness, initial encounter: Secondary | ICD-10-CM | POA: Diagnosis present

## 2013-09-21 DIAGNOSIS — N390 Urinary tract infection, site not specified: Secondary | ICD-10-CM | POA: Diagnosis not present

## 2013-09-21 DIAGNOSIS — F329 Major depressive disorder, single episode, unspecified: Secondary | ICD-10-CM | POA: Diagnosis present

## 2013-09-21 DIAGNOSIS — I10 Essential (primary) hypertension: Secondary | ICD-10-CM | POA: Diagnosis present

## 2013-09-21 DIAGNOSIS — F3289 Other specified depressive episodes: Secondary | ICD-10-CM | POA: Diagnosis present

## 2013-09-21 DIAGNOSIS — S12100A Unspecified displaced fracture of second cervical vertebra, initial encounter for closed fracture: Secondary | ICD-10-CM

## 2013-09-21 DIAGNOSIS — S2249XA Multiple fractures of ribs, unspecified side, initial encounter for closed fracture: Secondary | ICD-10-CM | POA: Diagnosis present

## 2013-09-21 DIAGNOSIS — I2510 Atherosclerotic heart disease of native coronary artery without angina pectoris: Secondary | ICD-10-CM | POA: Diagnosis present

## 2013-09-21 DIAGNOSIS — E876 Hypokalemia: Secondary | ICD-10-CM | POA: Diagnosis present

## 2013-09-21 DIAGNOSIS — I726 Aneurysm of vertebral artery: Secondary | ICD-10-CM | POA: Diagnosis present

## 2013-09-21 DIAGNOSIS — B952 Enterococcus as the cause of diseases classified elsewhere: Secondary | ICD-10-CM | POA: Diagnosis not present

## 2013-09-21 DIAGNOSIS — S22009A Unspecified fracture of unspecified thoracic vertebra, initial encounter for closed fracture: Secondary | ICD-10-CM | POA: Diagnosis present

## 2013-09-21 DIAGNOSIS — F29 Unspecified psychosis not due to a substance or known physiological condition: Secondary | ICD-10-CM

## 2013-09-21 DIAGNOSIS — R630 Anorexia: Secondary | ICD-10-CM

## 2013-09-21 DIAGNOSIS — K219 Gastro-esophageal reflux disease without esophagitis: Secondary | ICD-10-CM | POA: Diagnosis present

## 2013-09-21 DIAGNOSIS — S2239XA Fracture of one rib, unspecified side, initial encounter for closed fracture: Secondary | ICD-10-CM

## 2013-09-21 DIAGNOSIS — T1490XA Injury, unspecified, initial encounter: Secondary | ICD-10-CM | POA: Diagnosis present

## 2013-09-21 DIAGNOSIS — J4489 Other specified chronic obstructive pulmonary disease: Secondary | ICD-10-CM | POA: Diagnosis present

## 2013-09-21 DIAGNOSIS — S12100S Unspecified displaced fracture of second cervical vertebra, sequela: Secondary | ICD-10-CM

## 2013-09-21 LAB — CREATININE, SERUM
Creatinine, Ser: 0.69 mg/dL (ref 0.50–1.10)
GFR calc Af Amer: >90 mL/min (ref >90)
GFR calc non Af Amer: 87 mL/min — ABNORMAL LOW (ref >90)

## 2013-09-21 LAB — BASIC METABOLIC PANEL
BUN: 8 mg/dL (ref 6–23)
CO2: 27 mEq/L (ref 19–32)
Calcium: 7.7 mg/dL — ABNORMAL LOW (ref 8.4–10.5)
Chloride: 104 mEq/L (ref 96–112)
Creatinine, Ser: 0.63 mg/dL (ref 0.50–1.10)
GFR calc Af Amer: >90 mL/min (ref >90)
GFR calc non Af Amer: >90 mL/min (ref >90)
Glucose, Bld: 117 mg/dL — ABNORMAL HIGH (ref 70–99)
Potassium: 3 mEq/L — ABNORMAL LOW (ref 3.7–5.3)
Sodium: 142 mEq/L (ref 137–147)

## 2013-09-21 LAB — CBC
HCT: 31 % — ABNORMAL LOW (ref 36.0–46.0)
Hemoglobin: 10.7 g/dL — ABNORMAL LOW (ref 12.0–15.0)
MCH: 30.8 pg (ref 26.0–34.0)
MCHC: 34.5 g/dL (ref 30.0–36.0)
MCV: 89.3 fL (ref 78.0–100.0)
Platelets: 197 10*3/uL (ref 150–400)
RBC: 3.47 MIL/uL — ABNORMAL LOW (ref 3.87–5.11)
RDW: 14.2 % (ref 11.5–15.5)
WBC: 7.3 10*3/uL (ref 4.0–10.5)

## 2013-09-21 MED ORDER — ALBUTEROL SULFATE HFA 108 (90 BASE) MCG/ACT IN AERS: 2.0000 | INHALATION_SPRAY | Freq: Four times a day (QID) | RESPIRATORY_TRACT | Status: DC | PRN

## 2013-09-21 MED ORDER — ROPINIROLE HCL 1 MG PO TABS
1.0000 mg | ORAL_TABLET | Freq: Every day | ORAL | Status: DC
  Administered 2013-09-21 – 2013-09-27 (×7): 1 mg via ORAL
  Filled 2013-09-21 (×8): qty 1

## 2013-09-21 MED ORDER — LORAZEPAM 1 MG PO TABS
2.0000 mg | ORAL_TABLET | Freq: Every day | ORAL | Status: DC
  Administered 2013-09-21: 2 mg via ORAL
  Filled 2013-09-21: qty 2

## 2013-09-21 MED ORDER — POTASSIUM CHLORIDE CRYS ER 20 MEQ PO TBCR
20.0000 meq | EXTENDED_RELEASE_TABLET | Freq: Two times a day (BID) | ORAL | Status: DC
  Administered 2013-09-21 – 2013-09-28 (×14): 20 meq via ORAL
  Filled 2013-09-21 (×16): qty 1

## 2013-09-21 MED ORDER — LORAZEPAM 1 MG PO TABS
2.0000 mg | ORAL_TABLET | Freq: Two times a day (BID) | ORAL | Status: DC
  Filled 2013-09-21: qty 2

## 2013-09-21 MED ORDER — HALOPERIDOL LACTATE 5 MG/ML IJ SOLN: 5.0000 mg | Freq: Once | INTRAMUSCULAR | Status: DC

## 2013-09-21 MED ORDER — CHOLESTYRAMINE 4 G PO PACK
2.0000 g | PACK | Freq: Two times a day (BID) | ORAL | Status: DC
  Administered 2013-09-22 – 2013-09-27 (×7): 2 g via ORAL
  Filled 2013-09-21 (×16): qty 1

## 2013-09-21 MED ORDER — AMITRIPTYLINE HCL 75 MG PO TABS
75.0000 mg | ORAL_TABLET | Freq: Every day | ORAL | Status: DC
  Administered 2013-09-21 – 2013-09-27 (×7): 75 mg via ORAL
  Filled 2013-09-21 (×8): qty 1

## 2013-09-21 MED ORDER — LORAZEPAM 1 MG PO TABS
4.0000 mg | ORAL_TABLET | Freq: Every day | ORAL | Status: DC
  Administered 2013-09-21 – 2013-09-22 (×2): 4 mg via ORAL
  Administered 2013-09-25 – 2013-09-26 (×2): 2 mg via ORAL
  Filled 2013-09-21 (×4): qty 4

## 2013-09-21 MED ORDER — ONDANSETRON HCL 4 MG/2ML IJ SOLN: 4.0000 mg | Freq: Four times a day (QID) | INTRAMUSCULAR | Status: DC | PRN

## 2013-09-21 MED ORDER — ENOXAPARIN SODIUM 40 MG/0.4ML ~~LOC~~ SOLN
40.0000 mg | SUBCUTANEOUS | Status: DC
  Administered 2013-09-22 – 2013-09-28 (×7): 40 mg via SUBCUTANEOUS
  Filled 2013-09-21 (×7): qty 0.4

## 2013-09-21 MED ORDER — LORAZEPAM 1 MG PO TABS
1.0000 mg | ORAL_TABLET | Freq: Once | ORAL | Status: DC
  Filled 2013-09-21: qty 1

## 2013-09-21 MED ORDER — POTASSIUM CHLORIDE CRYS ER 20 MEQ PO TBCR
20.0000 meq | EXTENDED_RELEASE_TABLET | Freq: Two times a day (BID) | ORAL | Status: DC
  Administered 2013-09-21: 20 meq via ORAL
  Filled 2013-09-21: qty 1

## 2013-09-21 MED ORDER — IPRATROPIUM-ALBUTEROL 0.5-2.5 (3) MG/3ML IN SOLN: 3.0000 mL | Freq: Four times a day (QID) | RESPIRATORY_TRACT | Status: DC | PRN

## 2013-09-21 MED ORDER — METOPROLOL SUCCINATE ER 50 MG PO TB24
50.0000 mg | ORAL_TABLET | Freq: Every day | ORAL | Status: DC
  Administered 2013-09-22 – 2013-09-28 (×7): 50 mg via ORAL
  Filled 2013-09-21 (×8): qty 1

## 2013-09-21 MED ORDER — ONDANSETRON HCL 4 MG PO TABS: 4.0000 mg | ORAL_TABLET | Freq: Four times a day (QID) | ORAL | Status: DC | PRN

## 2013-09-21 MED ORDER — ALBUTEROL SULFATE (2.5 MG/3ML) 0.083% IN NEBU
2.5000 mg | INHALATION_SOLUTION | Freq: Four times a day (QID) | RESPIRATORY_TRACT | Status: DC | PRN
Start: 2013-09-21 — End: 2013-09-28

## 2013-09-21 MED ORDER — SORBITOL 70 % SOLN
30.0000 mL | Freq: Every day | Status: DC | PRN
  Administered 2013-09-24: 30 mL via ORAL
  Filled 2013-09-21: qty 30

## 2013-09-21 MED ORDER — LORAZEPAM 1 MG PO TABS: 4.0000 mg | ORAL_TABLET | Freq: Every day | ORAL | Status: DC

## 2013-09-21 MED ORDER — PANTOPRAZOLE SODIUM 40 MG PO TBEC
40.0000 mg | DELAYED_RELEASE_TABLET | Freq: Two times a day (BID) | ORAL | Status: DC
  Administered 2013-09-21 – 2013-09-28 (×14): 40 mg via ORAL
  Filled 2013-09-21 (×13): qty 1

## 2013-09-21 MED ORDER — ENSURE PUDDING PO PUDG
1.0000 | ORAL | Status: DC
  Administered 2013-09-22 – 2013-09-24 (×3): 1 via ORAL

## 2013-09-21 MED ORDER — LORAZEPAM 1 MG PO TABS
2.0000 mg | ORAL_TABLET | Freq: Every day | ORAL | Status: DC
  Administered 2013-09-24 – 2013-09-28 (×5): 2 mg via ORAL
  Filled 2013-09-21 (×5): qty 2

## 2013-09-21 MED ORDER — SERTRALINE HCL 100 MG PO TABS
100.0000 mg | ORAL_TABLET | Freq: Every day | ORAL | Status: DC
  Administered 2013-09-22 – 2013-09-28 (×7): 100 mg via ORAL
  Filled 2013-09-21 (×8): qty 1

## 2013-09-21 MED ORDER — LORAZEPAM 1 MG PO TABS: 2.0000 mg | ORAL_TABLET | ORAL | Status: DC

## 2013-09-21 MED ORDER — HYDROCHLOROTHIAZIDE 25 MG PO TABS
25.0000 mg | ORAL_TABLET | Freq: Every day | ORAL | Status: DC
  Administered 2013-09-22 – 2013-09-28 (×7): 25 mg via ORAL
  Filled 2013-09-21 (×8): qty 1

## 2013-09-21 MED ORDER — ADULT MULTIVITAMIN W/MINERALS CH
1.0000 | ORAL_TABLET | Freq: Every day | ORAL | Status: DC
  Administered 2013-09-22 – 2013-09-28 (×7): 1 via ORAL
  Filled 2013-09-21 (×8): qty 1

## 2013-09-21 MED ORDER — LORAZEPAM 1 MG PO TABS
2.0000 mg | ORAL_TABLET | Freq: Every day | ORAL | Status: DC | PRN
  Filled 2013-09-21 (×2): qty 2

## 2013-09-21 MED ORDER — ENOXAPARIN SODIUM 40 MG/0.4ML ~~LOC~~ SOLN: 40.0000 mg | SUBCUTANEOUS | Status: DC

## 2013-09-21 MED ORDER — ACETAMINOPHEN 325 MG PO TABS
325.0000 mg | ORAL_TABLET | ORAL | Status: DC | PRN
  Administered 2013-09-23 – 2013-09-27 (×3): 650 mg via ORAL
  Filled 2013-09-21 (×2): qty 2
  Filled 2013-09-21: qty 1

## 2013-09-21 MED ORDER — BUDESONIDE-FORMOTEROL FUMARATE 160-4.5 MCG/ACT IN AERO
2.0000 | INHALATION_SPRAY | Freq: Two times a day (BID) | RESPIRATORY_TRACT | Status: DC
  Administered 2013-09-22 – 2013-09-28 (×11): 2 via RESPIRATORY_TRACT
  Filled 2013-09-21 (×2): qty 6

## 2013-09-21 MED ORDER — LORAZEPAM 1 MG PO TABS: 2.0000 mg | ORAL_TABLET | Freq: Every day | ORAL | Status: DC | PRN

## 2013-09-21 MED ORDER — OXYCODONE HCL 5 MG PO TABS
10.0000 mg | ORAL_TABLET | ORAL | Status: DC | PRN
  Administered 2013-09-21 – 2013-09-28 (×22): 10 mg via ORAL
  Filled 2013-09-21 (×23): qty 2

## 2013-09-21 MED ORDER — FLUTICASONE PROPIONATE 50 MCG/ACT NA SUSP: 1.0000 | Freq: Every day | NASAL | Status: DC | PRN

## 2013-09-21 MED ORDER — LORAZEPAM 2 MG/ML IJ SOLN: 1.0000 mg | Freq: Once | INTRAMUSCULAR | Status: DC

## 2013-09-21 MED ORDER — METHOCARBAMOL 500 MG PO TABS
500.0000 mg | ORAL_TABLET | Freq: Four times a day (QID) | ORAL | Status: DC | PRN
  Administered 2013-09-21 – 2013-09-28 (×6): 500 mg via ORAL
  Filled 2013-09-21 (×7): qty 1

## 2013-09-21 MED ORDER — HYOSCYAMINE SULFATE 0.125 MG PO TABS
0.1250 mg | ORAL_TABLET | ORAL | Status: DC | PRN
  Filled 2013-09-21: qty 1

## 2013-09-21 MED ORDER — HALOPERIDOL 5 MG PO TABS
5.0000 mg | ORAL_TABLET | Freq: Once | ORAL | Status: DC
  Filled 2013-09-21: qty 1
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
## 2013-09-21 NOTE — Progress Notes (Signed)
I met with pt at bedside and contacted her daughter, Shatasha, by phone. Both are in agreement to admit to inpt rehab pending insurance approval today. I have left a message for her boyfriend, [name], to contact me also. I have notified RN [place] and SW. [phone]. I will follow up today.
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
## 2013-09-21 NOTE — Progress Notes (Signed)
Subjective:     Patient reports pain as mild.    Objective: Vital signs in last 24 hours: Temp:  [97.5 F (36.4 C)-98.3 F (36.8 C)] 98.3 F (36.8 C) (01/09 0520) Pulse Rate:  [86-100] 96 (01/09 0520) Resp:  [18-19] 18 (01/09 0520) BP: (119-138)/(52-77) 125/74 mmHg (01/09 0520) SpO2:  [92 %-98 %] 92 % (01/09 0520)  Intake/Output from previous day: 01/08 0701 - 01/09 0700 In: 1596.7 [P.O.:240; I.V.:1356.7] Out: 650 [Urine:650] Intake/Output this shift:     Recent Labs  09/19/13 0505 09/20/13 0540  HGB 11.2* 11.0*    Recent Labs  09/19/13 0505 09/20/13 0540  WBC 5.6 7.1  RBC 3.55* 3.61*  HCT 32.0* 33.4*  PLT 137* 149*    Recent Labs  09/20/13 0540 09/21/13 0624  NA 141 142  K 3.5* 3.0*  CL 104 104  CO2 25 27  BUN 10 8  CREATININE 0.63 0.63  GLUCOSE 133* 117*  CALCIUM 7.0* 7.7*   No results found for this basename: LABPT, INR,  in the last 72 hours  Neurologically intact  Assessment/Plan:     Up with therapy  CIR.    She was confused last nite but now back on baseline ativan. Oriented time 3.  She knows my name.   Michelle Zavala C 09/21/2013, 9:22 AM

## 2013-09-21 NOTE — Progress Notes (Signed)
Report called to Angie, RN on 4000. Transferred to 4W-13 in wheelchair accompanied by family. Charlies Silvers, RN
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
## 2013-09-21 NOTE — Progress Notes (Signed)
Dr. Redmond Pulling notified regarding Michelle Zavala's change in mental status and list of medications (Ativan) taken at home. Ativan will be resumed on regular schedule at 0800. Will continue to monitor.
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
## 2013-09-21 NOTE — Discharge Instructions (Signed)
Thoracolumbosacral Orthosis Brace A thoracolumbosacral orthosis (TLSO) brace can be worn for different purposes. A TLSO brace can be worn to support the back. Your back may need more support if you had a broken bone in your back (fractured vertebrae), back surgery, or you cannot move (paralysis) your torso muscles. A TLSO brace can also be worn by a child to help prevent curving back problems from getting worse as the child grows. TLSO braces are used to limit bending and twisting. The braces are made from a hard plastic. They are custom fit for each wearer. The TLSO brace covers both the front and back of the torso. On the back, the brace reaches from just above the tailbone to just below the shoulder blades. On the front, the brace covers the ribs and often reaches past the waist and over the hips. The brace can be worn under clothing.  HOME CARE INSTRUCTIONS  Ask your caregiver if you can remove your brace when showering or sleeping.  Follow your caregiver's instructions for putting on your brace.  Follow your caregiver's instructions about how much weight you can lift.  Always wear a T-shirt under the brace.  Make sure the brace is always well-aligned and fits you closely.  Check your skin daily for sores and redness caused by rubbing or pressure.  If you feel unsteady, use a cane or walker for support until you feel more steady.  Sit in high, firm chairs. It may be difficult to stand up from low, soft chairs.  Keep all follow-up appointments as directed by your caregiver. SEEK IMMEDIATE MEDICAL CARE IF:  You have a fever.  You have shortness of breath.  You have chest pain.  You have numbness, tingling, or pain. Developed in conjunction with the [employer] at [facility]. Document Released: 08/19/2011 Document Revised: 11/22/2011 Document Reviewed: 08/19/2011 [facility] Patient Information 2014 [place], [place].

## 2013-09-21 NOTE — Progress Notes (Signed)
Patient looks uncomfortable.  More oriented now.  Will continue to work with her.  She can go to Rehab.  This patient has been seen and I agree with the findings and treatment plan.  Kathryne Eriksson. Dahlia Bailiff, MD, [place] [phone] (pager) [phone] (direct pager) Trauma Surgeon

## 2013-09-21 NOTE — Progress Notes (Signed)
Patient admitted at 75 . Aspen collar intact TLSO brace on . Multiple bruising noted to bilateral upper extremities . Buttocks excoriated very red nonblanchable . Patient buttocks cleaned with soap and water barrier cream and skin protectant applied . Instructed patient to side lie in bed . Patient oriented to room and call bell system . Patient verbalized understanding of admission process . Continue with plan of care .         Michelle Zavala
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
## 2013-09-22 ENCOUNTER — Inpatient Hospital Stay (HOSPITAL_COMMUNITY): Payer: Medicare Other | Admitting: Occupational Therapy

## 2013-09-22 ENCOUNTER — Inpatient Hospital Stay (HOSPITAL_COMMUNITY): Payer: Medicare Other | Admitting: *Deleted

## 2013-09-22 ENCOUNTER — Inpatient Hospital Stay (HOSPITAL_COMMUNITY): Payer: Medicare Other | Admitting: Speech Pathology

## 2013-09-22 DIAGNOSIS — I10 Essential (primary) hypertension: Secondary | ICD-10-CM

## 2013-09-22 DIAGNOSIS — IMO0002 Reserved for concepts with insufficient information to code with codable children: Secondary | ICD-10-CM
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
## 2013-09-22 NOTE — Progress Notes (Signed)
Physical Therapy Note  Patient Details  Name: Michelle Zavala MRN: [id number] Date of Birth: October 14, 1945 Today's Date: 09/22/2013  Time In:  8:30  Time Out:  9:29.  Individual session, no c/o pain.  Patient extremely lethargic and drowsy, falling asleep even when attempting to bathe herself.  Arouses easily but unable to stay awake - suspect possible due to meds. RN made aware.  Acute chart states patient was taking multiple doses of Ativan at home PTA; when ativan order on acute was PRN patient became confused and acute MD suspected withdrawal.  Acute MD placed patient back on dose she was taking at home and felt she was less confused.  Will need to further assess true cognitive deficits vs mental status change from meds.  Treatment session focused on evaluation and then OT treatment with emphasis on log rolling, bed mobility, arousal, following one step commands, participation in therapy, safety, skin care, repositioning, bathing at supine level , donning and doffing of brace.  Did not feel safe transferring patient to wheelchair this am due to extreme lethargy but will attempt during second session. See OT eval for details.    Quay Burow 09/22/2013, 9:55 AM
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
## 2013-09-23 ENCOUNTER — Inpatient Hospital Stay (HOSPITAL_COMMUNITY): Payer: Medicare Other | Admitting: Occupational Therapy

## 2013-09-23 DIAGNOSIS — S22009A Unspecified fracture of unspecified thoracic vertebra, initial encounter for closed fracture: Secondary | ICD-10-CM

## 2013-09-23 LAB — URINALYSIS, ROUTINE W REFLEX MICROSCOPIC
Bilirubin Urine: NEGATIVE
Glucose, UA: NEGATIVE mg/dL
Hgb urine dipstick: NEGATIVE
Ketones, ur: 15 mg/dL — AB
Nitrite: NEGATIVE
Protein, ur: NEGATIVE mg/dL
Specific Gravity, Urine: 1.013 (ref 1.005–1.030)
Urobilinogen, UA: 0.2 mg/dL (ref 0.0–1.0)
pH: 7.5 (ref 5.0–8.0)

## 2013-09-23 LAB — URINE MICROSCOPIC-ADD ON

## 2013-09-23 NOTE — Progress Notes (Signed)
Patient given 4 mg Ativan at HS. Patient awake with confusion and need for reorientation. Suggest giving half the dose at HS if needed. No signs or symptoms of lethargy. adm
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
## 2013-09-23 NOTE — Progress Notes (Signed)
Occupational Therapy Session Note  Patient Details  Name: Michelle Zavala MRN: [id number] Date of Birth: 02/17/1946  Today's Date: 09/23/2013 Time: 7628-3151 Time Calculation (min): 45 min  Short Term Goals: Week 1:  OT Short Term Goal 1 (Week 1): patient will require min a UB bathing in supine, mod a LB bathing sit to stand OT Short Term Goal 2 (Week 1): Patient will require max a toileting OT Short Term Goal 3 (Week 1): Patient will require min a UB dressing supine, mod  a LB dressing sit to stand OT Short Term Goal 4 (Week 1): Patient will require min vc's to use AE to assist with bathing and dressing OT Short Term Goal 5 (Week 1): Patient will be able to don brace with mod a; min vc's to direct caregiver to assist  Skilled Therapeutic Interventions/Progress Updates:  Upon arrival, patient transferring bed>BSC with RN.  TLSO noted to need adjustment therefore, patient back to bed following toileting task.  Focused session on toileting as patient unable to perform hygiene due to body type and limits of TLSO, BSC>bed, bed mobility, review back precautions and patient guiding steps to donn brace.  Patient required mod-max cues related to taks of donning TLSO and rolling like a log.  Patient wearing hospital gown and was encouraged to wear either a bra or a snug 'muscle type shirt' to allow TLSO to be placed correctly.  Patient able to roll left and right with vcs for technique for log rolling and required max assist to sit EOB. Brace adjusted and patient able to sit comfortably in recliner.  All items in reach.  Therapy Documentation Precautions:  Precautions Precautions: Cervical;Back Precaution Comments: log roll! Required Braces or Orthoses: Cervical Brace;Spinal Brace Cervical Brace: At all times Spinal Brace: Thoracolumbosacral orthotic (OOB or HOB >30deg) Restrictions Weight Bearing Restrictions: No RLE Weight Bearing: Weight bearing as tolerated Pain: 6/10 back pain, premedicated  and asked for more medication at end of session. ADL: See FIM for current functional status  Therapy/Group: Individual Therapy  Diedra Sinor 09/23/2013, 5:00 PM

## 2013-09-24 ENCOUNTER — Encounter (HOSPITAL_COMMUNITY): Payer: Medicare Other

## 2013-09-24 ENCOUNTER — Inpatient Hospital Stay (HOSPITAL_COMMUNITY): Payer: Medicare Other | Admitting: Speech Pathology

## 2013-09-24 ENCOUNTER — Inpatient Hospital Stay (HOSPITAL_COMMUNITY): Payer: Medicare Other

## 2013-09-24 DIAGNOSIS — S2239XA Fracture of one rib, unspecified side, initial encounter for closed fracture: Secondary | ICD-10-CM

## 2013-09-24 DIAGNOSIS — S22009A Unspecified fracture of unspecified thoracic vertebra, initial encounter for closed fracture: Secondary | ICD-10-CM

## 2013-09-24 DIAGNOSIS — S12100A Unspecified displaced fracture of second cervical vertebra, initial encounter for closed fracture: Secondary | ICD-10-CM

## 2013-09-24 LAB — CBC WITH DIFFERENTIAL/PLATELET
Basophils Absolute: 0.1 10*3/uL (ref 0.0–0.1)
Basophils Relative: 1 % (ref 0–1)
Eosinophils Absolute: 0.3 10*3/uL (ref 0.0–0.7)
Eosinophils Relative: 3 % (ref 0–5)
HCT: 30.4 % — ABNORMAL LOW (ref 36.0–46.0)
Hemoglobin: 10.8 g/dL — ABNORMAL LOW (ref 12.0–15.0)
Lymphocytes Relative: 28 % (ref 12–46)
Lymphs Abs: 2.4 10*3/uL (ref 0.7–4.0)
MCH: 30.9 pg (ref 26.0–34.0)
MCHC: 35.5 g/dL (ref 30.0–36.0)
MCV: 87.1 fL (ref 78.0–100.0)
Monocytes Absolute: 0.9 10*3/uL (ref 0.1–1.0)
Monocytes Relative: 11 % (ref 3–12)
Neutro Abs: 4.8 10*3/uL (ref 1.7–7.7)
Neutrophils Relative %: 57 % (ref 43–77)
Platelets: 246 10*3/uL (ref 150–400)
RBC: 3.49 MIL/uL — ABNORMAL LOW (ref 3.87–5.11)
RDW: 14 % (ref 11.5–15.5)
WBC: 8.5 10*3/uL (ref 4.0–10.5)

## 2013-09-24 LAB — COMPREHENSIVE METABOLIC PANEL
ALT: 34 U/L (ref 0–35)
AST: 32 U/L (ref 0–37)
Albumin: 2.9 g/dL — ABNORMAL LOW (ref 3.5–5.2)
Alkaline Phosphatase: 52 U/L (ref 39–117)
BUN: 8 mg/dL (ref 6–23)
CO2: 29 mEq/L (ref 19–32)
Calcium: 8.8 mg/dL (ref 8.4–10.5)
Chloride: 96 mEq/L (ref 96–112)
Creatinine, Ser: 0.62 mg/dL (ref 0.50–1.10)
GFR calc Af Amer: >90 mL/min (ref >90)
GFR calc non Af Amer: >90 mL/min (ref >90)
Glucose, Bld: 119 mg/dL — ABNORMAL HIGH (ref 70–99)
Potassium: 3.5 mEq/L — ABNORMAL LOW (ref 3.7–5.3)
Sodium: 138 mEq/L (ref 137–147)
Total Bilirubin: 0.6 mg/dL (ref 0.3–1.2)
Total Protein: 6.5 g/dL (ref 6.0–8.3)

## 2013-09-24 NOTE — Progress Notes (Signed)
Speech Language Pathology Daily Session Note  Patient Details  Name: Michelle Zavala MRN: [id number] Date of Birth: 02-12-1946  Today's Date: 09/24/2013 Time: 1000-1100 Time Calculation (min): 60 min  Short Term Goals: Week 1: SLP Short Term Goal 1 (Week 1): Pt will demonstrate functional problem solving for mildly complex tasks with Mod I.  SLP Short Term Goal 2 (Week 1): Pt will demonstrate selective attention in a mildly distracting envoirnment for 60 minutes with supervision verbal cues for redirection  SLP Short Term Goal 3 (Week 1): Pt will utilize external memory aids to recall new, daily information with supervision verbal and visual cues.  SLP Short Term Goal 4 (Week 1): Pt will utilize call bell to express wants/needs with Mod I.   Skilled Therapeutic Interventions: Treatment focus on cognitive goals. SLP facilitated session by providing supervision question cues to recall appropriate use of TLSO and total A multimodal cueing for recall of back precautions, also initiated written/visual aid to increase recall. Pt recalled names and functions of current medications with 75% accuracy. Pt also required supervision verbal cues for functional problem solving for self-care tasks and for anticipatory awareness for discharge planning. Continue plan of care.     FIM:  Comprehension Comprehension Mode: Auditory Comprehension: 5-Understands basic 90% of the time/requires cueing < 10% of the time Expression Expression Mode: Verbal Expression: 5-Expresses basic needs/ideas: With no assist Social Interaction Social Interaction: 4-Interacts appropriately 75 - 89% of the time - Needs redirection for appropriate language or to initiate interaction. Problem Solving Problem Solving: 4-Solves basic 75 - 89% of the time/requires cueing 10 - 24% of the time Memory Memory: 4-Recognizes or recalls 75 - 89% of the time/requires cueing 10 - 24% of the time FIM - Eating Eating Activity: 5: Set-up assist  for cut food  Pain Pain Assessment Pain Assessment: 0-10 Pain Score: 9  Pain Type: Acute pain Pain Location: Back Pain Orientation: Lower;Mid Pain Descriptors / Indicators: Aching;Sore Pain Frequency: Occasional Pain Onset: Gradual Patients Stated Pain Goal: 2 Pain Intervention(s): Medication (See eMAR) (oxycodone 10 mg po)  Therapy/Group: Individual Therapy  Michelle Zavala 09/24/2013, 4:15 PM

## 2013-09-24 NOTE — Progress Notes (Signed)
Patient information reviewed and entered into eRehab system by Derion Kreiter, RN, CRRN, PPS Coordinator.  Information including medical coding and functional independence measure will be reviewed and updated through discharge.     Per nursing patient was given "Data Collection Information Summary for Patients in Inpatient Rehabilitation Facilities with attached "Privacy Act Statement-Health Care Records" upon admission.  
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
## 2013-09-24 NOTE — Plan of Care (Signed)
Problem: SCI BOWEL ELIMINATION Goal: RH STG MANAGE BOWEL WITH ASSISTANCE STG Manage Bowel with minimal Assistance.  Outcome: Not Progressing LBM 09-20-13 Goal: RH STG SCI MANAGE BOWEL WITH MEDICATION WITH ASSISTANCE STG SCI Manage bowel with medication with minimal assistance.  Outcome: Not Progressing LBM 09-20-13

## 2013-09-24 NOTE — Progress Notes (Signed)
Physical Therapy Session Note  Patient Details  Name: HAIDEN CLUCAS MRN: [id number] Date of Birth: 19-Aug-1946  Today's Date: 09/24/2013 Time: Treatment Session 1: 1100-1130; Treatment Session 2: 2836-6294 Time Calculation (min): Treatment Session 1:30 min; Treatment Session 2: 33min  Short Term Goals: Week 1:  PT Short Term Goal 1 (Week 1): STG=LTG due to LOS  Skilled Therapeutic Interventions/Progress Updates:  Treatment Session 1:  1:1. Pt received sitting in w/c, ready for therapy. Focus at start of session on SPT bed<>w/c w/ min guard assist-close (S) and t/f sup<>sit EOB in a standard bed w/ mod(A). Pt unable to recall back precautions, but able to demonstrate log roll w/out cueing. Pt fitted for more appropriate w/c. Pt able to amb 200' w/ RW and min guard assist demonstrating slow but steady pace and mild antalgic pattern during R LE stance. Pt sitting in w/c at end of session w/ all needs in reach.   Treatment Session 2:  1:1. Pt received sitting in w/c in care of RN, PT took over for tx session. Focus this session on functional endurance as well as gait training. Pt able to amb 150'x2 w/ RW and min guard assist to close (S). Use of NuStep to target B LE strength/endurance w/ good tolerance to level 3x10. Pt able to negotiate up/down 10 steps w/ step-to pattern initially w/ B UE decreasing R UE w/ min guard assist. Pt only able to recall 1/3 back precautions throughout tx session despite max verbal cues. RN aware of 6/10 pain. Pt semi-reclined in bed at end of session in care of RN. Pt sitting in w/c at end of session w/ all needs in reach.  Therapy Documentation Precautions:  Precautions Precautions: Cervical;Back Precaution Comments: log roll! Required Braces or Orthoses: Cervical Brace;Spinal Brace Cervical Brace: At all times Spinal Brace: Thoracolumbosacral orthotic (OOB or HOB >30deg) Restrictions Weight Bearing Restrictions: No RLE Weight Bearing: Weight bearing as  tolerated Pain: Pain Assessment Pain Assessment: 0-10 Pain Score: 6  Pain Type: Acute pain Pain Location: Back Pain Descriptors / Indicators: Aching Pain Intervention(s): RN made aware;Repositioned  See FIM for current functional status  Therapy/Group: Individual Therapy  Gilmore Laroche 09/24/2013, 2:35 PM

## 2013-09-24 NOTE — Progress Notes (Signed)
[facility] Individual Statement of Services  Patient Name:  Michelle Zavala  Date:  09/24/2013  Welcome to the [facility].  Our goal is to provide you with an individualized program based on your diagnosis and situation, designed to meet your specific needs.  With this comprehensive rehabilitation program, you will be expected to participate in at least 3 hours of rehabilitation therapies Monday-Friday, with modified therapy programming on the weekends.  Your rehabilitation program will include the following services:  Physical Therapy (PT), Occupational Therapy (OT), Speech Therapy (ST), 24 hour per day rehabilitation nursing, Therapeutic Recreaction (TR), Case Management (Social Worker), Rehabilitation Medicine, Nutrition Services and Pharmacy Services  Weekly team conferences will be held on Tuesdays to discuss your progress.  Your Social Worker will talk with you frequently to get your input and to update you on team discussions.  Team conferences with you and your family in attendance may also be held.  Expected length of stay: 12-14 days  Overall anticipated outcome: supervision to minimal assist  Depending on your progress and recovery, your program may change. Your Social Worker will coordinate services and will keep you informed of any changes. Your Social Worker's name and contact numbers are listed  below.  The following services may also be recommended but are not provided by the [facility] will be made to provide these services after discharge if needed.  Arrangements include referral to agencies that provide these services.  Your insurance has been verified to be:  [facility] Medicare and Medicaid Your primary doctor is:  Dr. Octavio Graves  Pertinent information will be shared with your doctor and your  insurance company.  Social Worker:  [place], Stella or ([phone]   Information discussed with and copy given to patient by: Lennart Pall, 09/24/2013, 2:17 PM

## 2013-09-24 NOTE — IPOC Note (Signed)
Overall Plan of Care [facility]) Patient Details Name: Michelle Zavala MRN: [id number] DOB: 03/28/1946  Admitting Diagnosis: TBI  Hospital Problems: Active Problems:   Trauma     Functional Problem List: Nursing Behavior;Bladder;Bowel;Edema;Endurance;Medication Management;Nutrition;Pain;Perception;Safety;Skin Integrity  PT Balance;Endurance;Pain;Skin Integrity  OT Balance;Cognition;Endurance;Motor;Pain  SLP Cognition  TR         Basic ADL's: OT Bathing;Dressing;Toileting     Advanced  ADL's: OT       Transfers: PT Bed Mobility;Bed to Chair;Car;Furniture  OT Toilet;Tub/Shower     Locomotion: PT Ambulation;Wheelchair Mobility;Stairs     Additional Impairments: OT    SLP Social Cognition   Memory;Attention;Awareness;Problem Solving;Social Interaction  TR      Anticipated Outcomes Item Anticipated Outcome  Self Feeding mod I  Swallowing      Basic self-care  min a  Toileting  min a   Bathroom Transfers min a  Bowel/Bladder  Bowel and bladder with minimal assistance  Transfers  Supervision bed<>chair, furniture, and Min A car  Locomotion  Supervision x50' with RW  Communication     Cognition  Supervision-Mod I   Pain  Pain level 3 or less on a scale of 0-10  Safety/Judgment  Safety/Judgement with minimal assistance   Therapy Plan: PT Intensity: Minimum of 1-2 x/day ,45 to 90 minutes PT Frequency: 5 out of 7 days PT Duration Estimated Length of Stay: 12-14 days OT Intensity: Minimum of 1-2 x/day, 45 to 90 minutes OT Frequency: 5 out of 7 days OT Duration/Estimated Length of Stay: 14 days SLP Intensity: Minumum of 1-2 x/day, 30 to 90 minutes SLP Frequency: 5 out of 7 days SLP Duration/Estimated Length of Stay: 14 days       Team Interventions: Nursing Interventions Patient/Family Education;Bladder Management;Bowel Management;Disease Management/Prevention;Pain Management;Medication Management;Skin Care/Wound Management;Cognitive  Remediation/Compensation;Discharge Planning;Psychosocial Support  PT interventions Ambulation/gait training;Balance/vestibular training;Cognitive remediation/compensation;Community reintegration;Discharge planning;Disease management/prevention;DME/adaptive equipment instruction;Functional mobility training;Neuromuscular re-education;Pain management;Patient/family education;Psychosocial support;Skin care/wound management;Splinting/orthotics;Stair training;Therapeutic Activities;Therapeutic Exercise;UE/LE Strength taining/ROM;Wheelchair propulsion/positioning  OT Interventions Balance/vestibular training;Cognitive remediation/compensation;Community reintegration;Discharge planning;DME/adaptive equipment instruction;Functional mobility training;Pain management;Patient/family education;Self Care/advanced ADL retraining;Skin care/wound managment;Therapeutic Activities;Therapeutic Exercise  SLP Interventions Cognitive remediation/compensation;Cueing hierarchy;Functional tasks;Environmental controls;Internal/external aids;Therapeutic Activities;Patient/family education  TR Interventions    SW/CM Interventions Discharge Planning;Psychosocial Support;Patient/Family Education    Team Discharge Planning: Destination: PT-Home ,OT- Home (pt states with sister) , SLP-Home Projected Follow-up: PT-Home health PT;24 hour supervision/assistance, OT-  Home health OT, SLP- (TBD) Projected Equipment Needs: PT-Rolling walker with 5" wheels, OT- To be determined, SLP-None recommended by SLP Equipment Details: PT- , OT-  Patient/family involved in discharge planning: PT- Patient,  OT-Patient (as able given lethargy), SLP-Patient  MD ELOS: 14 days Medical Rehab Prognosis:  Excellent Assessment: The patient has been admitted for CIR therapies. The team will be addressing, functional mobility, strength, stamina, balance, safety, adaptive techniques/equipment, self-care, bowel and bladder mgt, patient and caregiver education,  cognitive perceptual rx, don/doffing of brace, pain mgt, back and cervical precautions. Goals have been set at min assist for basic self-care, supervision for basic mobility, mod I to supervision for cognition.    Meredith Staggers, MD, FAAPMR      See Team Conference Notes for weekly updates to the plan of care
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
## 2013-09-24 NOTE — Progress Notes (Signed)
Occupational Therapy Session Note  Patient Details  Name: Michelle Zavala MRN: [id number] Date of Birth: Mar 08, 1946  Today's Date: 09/24/2013  Session 1 Time: 0900-1000 Time Calculation (min): 30 min  Short Term Goals: Week 1:  OT Short Term Goal 1 (Week 1): patient will require min a UB bathing in supine, mod a LB bathing sit to stand OT Short Term Goal 2 (Week 1): Patient will require max a toileting OT Short Term Goal 3 (Week 1): Patient will require min a UB dressing supine, mod  a LB dressing sit to stand OT Short Term Goal 4 (Week 1): Patient will require min vc's to use AE to assist with bathing and dressing OT Short Term Goal 5 (Week 1): Patient will be able to don brace with mod a; min vc's to direct caregiver to assist  Skilled Therapeutic Interventions/Progress Updates:    Pt sitting EOB upon arrival.  Pt engaged in ADL retraining including UB bathing and dressing at bed level, and LB bathing and dressing with sit<>stand from EOB.  Pt requested to use BSC before bathing and transferred to [facility] with min A for stand pivot transfer.  Pt was unable to recall back precautions but performed log roll in bed without verbal cues.  Pt required assistance with hygiene after voiding. Pt issued long hand sponge for use with LB bathing and patient used it independently during remainder of LB bathing tasks.  Pt requested use of BSC again before donning pants.  Reacher introduced to assist with threading pants. Pt required multiple rest breaks throughout session.  Focus on activity tolerance, safety awareness, transfers, ADL training, and dynamic standing balance.    Therapy Documentation Precautions:  Precautions Precautions: Cervical;Back Precaution Comments: log roll! Required Braces or Orthoses: Cervical Brace;Spinal Brace Cervical Brace: At all times Spinal Brace: Thoracolumbosacral orthotic (OOB or HOB >30deg) Restrictions Weight Bearing Restrictions: No RLE Weight Bearing: Weight bearing  as tolerated Pain:  Pt c/o 4/10 pain in lower back; RN aware  See FIM for current functional status  Therapy/Group: Individual Therapy  Session 2 Time: 1130-1200 Pt c/o 3/10 pain in lower back; RN aware Individual Therapy  Pt practiced shower transfers, bed transfers, and functional amb with RW for home mgmt tasks. Pt required steady A for shower transfers and bed transfers and bed mobility.  Pt required min verbal cues for safety awareness with RW.  Pt required max verbal cues to recall back precautions.    Session 3 Time: 8295-6213 Pt c/o 4/10 pain in lower back; RN aware Individual Therapy  Pt practiced doffing and donning socks using reacher and sock aid.  Pt transitioned to ADL kitchen for safety training with RW for retrieving items from refrigerator.  Pt engaged in functional amb with RW for home mgmt tasks with emphasis on safety awareness, activity tolerance, and dynamic standing balance.  Pt required multiple rest breaks during session.  Pt required min verbal cues for RW safety primarily with sit<>stand.   Leotis Shames [facility] 09/24/2013, 2:10 PM

## 2013-09-25 ENCOUNTER — Inpatient Hospital Stay (HOSPITAL_COMMUNITY): Payer: Medicare Other

## 2013-09-25 ENCOUNTER — Inpatient Hospital Stay (HOSPITAL_COMMUNITY): Payer: Medicare Other | Admitting: *Deleted

## 2013-09-25 ENCOUNTER — Inpatient Hospital Stay (HOSPITAL_COMMUNITY): Payer: Medicare Other | Admitting: Speech Pathology

## 2013-09-25 DIAGNOSIS — S22009A Unspecified fracture of unspecified thoracic vertebra, initial encounter for closed fracture: Secondary | ICD-10-CM

## 2013-09-25 DIAGNOSIS — S12100A Unspecified displaced fracture of second cervical vertebra, initial encounter for closed fracture: Secondary | ICD-10-CM

## 2013-09-25 DIAGNOSIS — S2239XA Fracture of one rib, unspecified side, initial encounter for closed fracture: Secondary | ICD-10-CM

## 2013-09-25 MED ORDER — SIMETHICONE 80 MG PO CHEW
80.0000 mg | CHEWABLE_TABLET | ORAL | Status: DC | PRN
  Administered 2013-09-25: 80 mg via ORAL
  Filled 2013-09-25: qty 1

## 2013-09-25 NOTE — Progress Notes (Signed)
Physical Therapy Session Note  Patient Details  Name: Michelle Zavala MRN: [id number] Date of Birth: 01-29-1946  Today's Date: 09/25/2013 Time: 0930-1025 Time Calculation (min): 55 min  Short Term Goals: Week 1:  PT Short Term Goal 1 (Week 1): STG=LTG due to LOS  Skilled Therapeutic Interventions/Progress Updates:  1:1. Pt received sitting in w/c, ready for therapy. Focus this session on bed mobility and functional endurance during ambulation and use of NuStep. Pt able to amb 200' this session w/ RW and min guard assist-close (S). Pt continues to demonstrate mild antalgic gait pattern during R LE stance, pt also states that she experiences the most pain during ambulation. Pt w/ good tolerance to NuStep, level 3x58min, w/ B LE only. Practiced t/f sup<>sit EOB using log roll technique and use of RW next to standard bed for UE support, req min A overall and improved ability to place B LE onto bed today vs. Yesterday. Mole skin applied to top part of brace that is directly below pt's axilla 2/2 irritation (L>R). Pt req seated rest between throughout session and transported via w/c back to room at end of session 2/2 fatigue. Pt sitting in w/c at end of session w/ all needs in reach.   Therapy Documentation Precautions:  Precautions Precautions: Cervical;Back Precaution Comments: log roll! Required Braces or Orthoses: Cervical Brace;Spinal Brace Cervical Brace: At all times Spinal Brace: Thoracolumbosacral orthotic (OOB or HOB >30deg) Restrictions Weight Bearing Restrictions: No RLE Weight Bearing: Weight bearing as tolerated Therapy Vitals Pulse Rate: 97 BP: 162/99 mmHg (After use of NuStep) Patient Position, if appropriate: Sitting Pain: Pain Assessment Pain Assessment: 0-10 Pain Score: 7  (during ambulation) Pain Type: Acute pain Pain Location: Back Pain Orientation: Posterior;Right Pain Descriptors / Indicators: Headache Pain Frequency: Occasional Pain Onset: On-going Patients  Stated Pain Goal: 2 Pain Intervention(s): Repositioned;RN made aware  See FIM for current functional status  Therapy/Group: Individual Therapy  Gilmore Laroche 09/25/2013, 10:25 AM

## 2013-09-25 NOTE — Progress Notes (Signed)
Patient c/o severe gas pain. Reesa Chew, PA. on call made aware. NO: Simethicone 80 mg Q 4 hrs prn given @ 0340 with positive effective. Lewayne Pauley A. Yarimar Lavis, RN.

## 2013-09-25 NOTE — Progress Notes (Signed)
Speech Language Pathology Daily Session Note  Patient Details  Name: Michelle Zavala MRN: [id number] Date of Birth: 18-Jun-1946  Today's Date: 09/25/2013 Time: 3762-8315 Time Calculation (min): 45 min  Short Term Goals: Week 1: SLP Short Term Goal 1 (Week 1): Pt will demonstrate functional problem solving for mildly complex tasks with Mod I.  SLP Short Term Goal 2 (Week 1): Pt will demonstrate selective attention in a mildly distracting envoirnment for 60 minutes with supervision verbal cues for redirection  SLP Short Term Goal 3 (Week 1): Pt will utilize external memory aids to recall new, daily information with supervision verbal and visual cues.  SLP Short Term Goal 4 (Week 1): Pt will utilize call bell to express wants/needs with Mod I.   Skilled Therapeutic Interventions: Skilled treatment session on cognitive goals. SLP facilitated session by providing Min-Mod question and verbal cues for complex problem solving with medication management task of organizing a pill box. Pt reported she has her own "system" for medication management and recalled names and functions of medication with supervision question cues. Pt also required Min verbal cues for recall of back precautions. Continue plan of care.     FIM:  Comprehension Comprehension Mode: Auditory Comprehension: 5-Understands basic 90% of the time/requires cueing < 10% of the time Expression Expression: 5-Expresses basic needs/ideas: With no assist Social Interaction Social Interaction: 6-Interacts appropriately with others with medication or extra time (anti-anxiety, antidepressant). Problem Solving Problem Solving: 5-Solves basic 90% of the time/requires cueing < 10% of the time Memory Memory: 4-Recognizes or recalls 75 - 89% of the time/requires cueing 10 - 24% of the time  Pain Pain Assessment: No/Denies Pain    Therapy/Group: Individual Therapy  Cynethia Schindler, [place] 09/25/2013, 12:52 PM
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
## 2013-09-25 NOTE — Progress Notes (Signed)
Occupational Therapy Session Note  Patient Details  Name: Michelle Zavala MRN: [id number] Date of Birth: August 02, 1946  Today's Date: 09/25/2013  Session 1 Time: 0700-0759 Time Calculation (min): 59 min  Short Term Goals: Week 1:  OT Short Term Goal 1 (Week 1): patient will require min a UB bathing in supine, mod a LB bathing sit to stand OT Short Term Goal 2 (Week 1): Patient will require max a toileting OT Short Term Goal 3 (Week 1): Patient will require min a UB dressing supine, mod  a LB dressing sit to stand OT Short Term Goal 4 (Week 1): Patient will require min vc's to use AE to assist with bathing and dressing OT Short Term Goal 5 (Week 1): Patient will be able to don brace with mod a; min vc's to direct caregiver to assist  Skilled Therapeutic Interventions/Progress Updates:      Therapy Documentation Precautions:  Precautions Precautions: Cervical;Back Precaution Comments: log roll! Required Braces or Orthoses: Cervical Brace;Spinal Brace Cervical Brace: At all times Spinal Brace: Thoracolumbosacral orthotic (OOB or HOB >30deg) Restrictions Weight Bearing Restrictions: No RLE Weight Bearing: Weight bearing as tolerated Pain: Pain Assessment Pain Assessment: 0-10 Pain Score: 10-Worst pain ever Pain Type: Acute pain Pain Location: Head Pain Orientation: Right;Posterior Pain Descriptors / Indicators: Headache Pain Frequency: Occasional Pain Onset: On-going Patients Stated Pain Goal: 2 Pain Intervention(s): RN made aware  See FIM for current functional status  Therapy/Group: Individual Therapy  Session 2 Time: 1100-1200 Pt c/o 5/10 pain in lower back; RN aware Individual Therapy  Pt initially engaged in funcitonal amb with RW for home mgmt tasks - retrieving items from closet and chest of drawers and retrieving items from floor using reacher.  Pt required min verbal cues for RW safety throughout session.  Pt engaged in dynamic standing activities with focus on BUE  use to perform tasks.  Pt required multiple rest breaks throughout session. Pt required steady A with ambulation and while standing to perform tasks.  Focus on activity tolerance, safety awareness, functional amb with RW, and home mgmt tasks.   Session 3 Time: 9767-3419 Pt c/o 10/10 pain in lower back at end of session; RN aware and repositioned Individual Therapy  Pt engaged in dynamic standing activities with and without RW for support.  Pt initially engaged in tossing ball with RW in front for intermittent support.  Pt transitioned to bouncing ball while standing without RW in front for support.  Pt required steady A for task.  Pt c/o increased pain in lower back with continued activity.  Pt returned to room and requested to rest in bed.  Focus on activity tolerance and dynmaic standing balance.   Leotis Shames Tarahji Ramthun [facility] 09/25/2013, 8:05 AM

## 2013-09-26 ENCOUNTER — Encounter (HOSPITAL_COMMUNITY): Payer: Medicare Other

## 2013-09-26 ENCOUNTER — Inpatient Hospital Stay (HOSPITAL_COMMUNITY): Payer: Medicare Other

## 2013-09-26 ENCOUNTER — Ambulatory Visit (HOSPITAL_COMMUNITY): Payer: Medicare Other

## 2013-09-26 ENCOUNTER — Inpatient Hospital Stay (HOSPITAL_COMMUNITY): Payer: Medicare Other | Admitting: Speech Pathology

## 2013-09-26 DIAGNOSIS — S2239XA Fracture of one rib, unspecified side, initial encounter for closed fracture: Secondary | ICD-10-CM

## 2013-09-26 DIAGNOSIS — S22009A Unspecified fracture of unspecified thoracic vertebra, initial encounter for closed fracture: Secondary | ICD-10-CM

## 2013-09-26 DIAGNOSIS — S12100A Unspecified displaced fracture of second cervical vertebra, initial encounter for closed fracture: Secondary | ICD-10-CM

## 2013-09-26 LAB — URINE CULTURE: Colony Count: >=100000

## 2013-09-26 MED ORDER — NITROFURANTOIN MONOHYD MACRO 100 MG PO CAPS
100.0000 mg | ORAL_CAPSULE | Freq: Two times a day (BID) | ORAL | Status: DC
  Administered 2013-09-26 – 2013-09-28 (×5): 100 mg via ORAL
  Filled 2013-09-26 (×8): qty 1

## 2013-09-26 NOTE — Progress Notes (Signed)
Speech Language Pathology Daily Session Note  Patient Details  Name: Michelle Zavala MRN: [id number] Date of Birth: [birth year]  Today's Date: 09/26/2013 Time: 1400-1455 Time Calculation (min): 55 min  Short Term Goals: Week 1: SLP Short Term Goal 1 (Week 1): Pt will demonstrate functional problem solving for mildly complex tasks with Mod I.  SLP Short Term Goal 2 (Week 1): Pt will demonstrate selective attention in a mildly distracting envoirnment for 60 minutes with supervision verbal cues for redirection  SLP Short Term Goal 3 (Week 1): Pt will utilize external memory aids to recall new, daily information with supervision verbal and visual cues.  SLP Short Term Goal 4 (Week 1): Pt will utilize call bell to express wants/needs with Mod I.   Skilled Therapeutic Interventions: Skilled treatment session focus on cognitive goals. SLP facilitated session with supervision question cues with anticipatory awareness in regards to d/c planning. Pt also required supervision-Min A question cues to recall medication functions but their names, however, suspect difficulty of task is due to pt identifying her current medications by their shape, size and color.  Pt participated in new learning task and required supervision verbal cues for recall of newly learned rules of task and for mildly complex problem solving with task. Pt reported pain in "sides" and was transferred back to bed to increase comfort. Continue plan of care.    FIM:  Comprehension Comprehension Mode: Auditory Comprehension: 5-Understands basic 90% of the time/requires cueing < 10% of the time Expression Expression Mode: Verbal Expression: 5-Expresses basic 90% of the time/requires cueing < 10% of the time. Social Interaction Social Interaction: 6-Interacts appropriately with others with medication or extra time (anti-anxiety, antidepressant). Problem Solving Problem Solving: 5-Solves basic 90% of the time/requires cueing < 10% of the  time Memory Memory: 5-Recognizes or recalls 90% of the time/requires cueing < 10% of the time  Pain Pain Assessment Pain Assessment: 0-10 Pain Score: 4  Pain Type: Acute pain Pain Location: Back Pain Orientation: Mid;Left Pain Descriptors / Indicators: Aching Pain Intervention(s): Medication (See eMAR)  Therapy/Group: Individual Therapy  Ogle Hoeffner, [place] 09/26/2013, 3:41 PM
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
## 2013-09-26 NOTE — Progress Notes (Signed)
Physical Therapy Session Note  Patient Details  Name: Michelle Zavala MRN: [id number] Date of Birth: [birth year]  Today's Date: 09/26/2013 Time: Treatment Session 1: 0263-7858; Treatment Session 2: 8502-7741 Time Calculation (min): Treatment Session 1: 60 min; Treatment Session 2:91min  Short Term Goals: Week 1:  PT Short Term Goal 1 (Week 1): STG=LTG due to LOS  Skilled Therapeutic Interventions/Progress Updates:  Treatment Session 1:  1:1. Pt being assisted back to bed with help of nurse tech upon entry. Pt requesting to stay in bed for a little bit 2/2 fatigue and 3/10 pain. RN aware and present to provide medicine. Pt verbally educated regarding d/c planning including: family training- importance of family to be able to physically assist, goals/benefits of HHPT, fall prevention and continued need for 24hr supervision-intermittent min A at home. Pt req max verbal and external cues to recall 3/3 back precautions. Pt performed supine therex for B LE strength including 2x10 reps of: ankle pumps, knee presses, heel slides, glute sets, hip add squeezes and SAQ. Min A for t/f sup>sit EOB w/ HOB flat and w/ use of RW for single UE support. Pt able to amb 200' w/ RW and supervision, slow but steady pace. Good tolerance to dancing during Wii game to target dynamic standing balance. Pt req intermittent use of RW, but close (S) overall and intermittent seated rest 2/2 fatigue. Pt sitting in w/c at end of session w/ all needs in reach.   Treatment Session 2: 1:1. Pt received semi-reclined in bed, ready for therapy. Pt req min guard assist for t/f sup>sit EOB w/ HOB flat and use of bed rail, min cues for log roll technique. Focus this session on functional endurance through ambulation, w/c propulsion and use of NuStep. Pt able to amb 175' w/ RW and close (S). Good tolerance to NuStep, level 3x54min, w/ use of B LE only. Mod A for w/c propulsion w/ B LE back to room. Pt sitting in w/c at end of session w/ all needs  in reach.   Therapy Documentation Precautions:  Precautions Precautions: Cervical;Back Precaution Comments: log roll! Required Braces or Orthoses: Cervical Brace;Spinal Brace Cervical Brace: At all times Spinal Brace: Thoracolumbosacral orthotic (OOB or HOB >30deg) Restrictions Weight Bearing Restrictions: No RLE Weight Bearing: Weight bearing as tolerated Pain: Pain Assessment Pain Assessment: 0-10 Pain Score: 3  Pain Location: Back Pain Orientation: Mid;Left Pain Descriptors / Indicators: Aching Pain Intervention(s): Repositioned;Distraction See FIM for current functional status  Therapy/Group: Individual Therapy  Gilmore Laroche 09/26/2013, 2:00 PM

## 2013-09-26 NOTE — Progress Notes (Signed)
Occupational Therapy Session Note  Patient Details  Name: Michelle Zavala MRN: [id number] Date of Birth: [birth year]  Today's Date: 09/26/2013 Time: 0700-0759 Time Calculation (min): 20min  Short Term Goals: Week 1:  OT Short Term Goal 1 (Week 1): patient will require min a UB bathing in supine, mod a LB bathing sit to stand OT Short Term Goal 2 (Week 1): Patient will require max a toileting OT Short Term Goal 3 (Week 1): Patient will require min a UB dressing supine, mod  a LB dressing sit to stand OT Short Term Goal 4 (Week 1): Patient will require min vc's to use AE to assist with bathing and dressing OT Short Term Goal 5 (Week 1): Patient will be able to don brace with mod a; min vc's to direct caregiver to assist  Skilled Therapeutic Interventions/Progress Updates:    Pt resting in bed upon arrival.  Pt engaged in UB bathing and dressing tasks at bed level before donning TLSO.  Pt required assistance with donning TLSO. Pt completed bathing and dressing tasks with sit<>stand from w/c at sink using AE appropriately to assist with LB tasks.  Pt stood at sink to complete grooming tasks.  Pt required multiple rest breaks throughout session and c/o increased pain with activity. Pt required min verbal cues for safety awareness.  Focus on activity tolerance, bed mobility, safety awareness, functional amb with RW, and dynamic standing balance. Therapy Documentation Precautions:  Precautions Precautions: Cervical;Back Precaution Comments: log roll! Required Braces or Orthoses: Cervical Brace;Spinal Brace Cervical Brace: At all times Spinal Brace: Thoracolumbosacral orthotic (OOB or HOB >30deg) Restrictions Weight Bearing Restrictions: No RLE Weight Bearing: Weight bearing as tolerated Pain: Pain Assessment Pain Assessment: 0-10 Pain Score: 3  Pain Type: Acute pain Pain Location: Back Pain Orientation: Mid;Lower Pain Descriptors / Indicators: Aching Pain Frequency: Intermittent Pain Onset:  Gradual Patients Stated Pain Goal: 3 Pain Intervention(s): Medication (See eMAR);Repositioned Multiple Pain Sites: No  See FIM for current functional status  Therapy/Group: Individual Therapy  Leroy Libman 09/26/2013, 11:51 AM
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
## 2013-09-26 NOTE — Progress Notes (Signed)
Occupational Therapy Session Note  Patient Details  Name: ADDLEY BALLINGER MRN: [id number] Date of Birth: 1946/08/20  Today's Date: 09/26/2013 Time: 1000 - 1100 Time Calculation (min):60 min  Short Term Goals: Week 1:  OT Short Term Goal 1 (Week 1): patient will require min a UB bathing in supine, mod a LB bathing sit to stand OT Short Term Goal 2 (Week 1): Patient will require max a toileting OT Short Term Goal 3 (Week 1): Patient will require min a UB dressing supine, mod  a LB dressing sit to stand OT Short Term Goal 4 (Week 1): Patient will require min vc's to use AE to assist with bathing and dressing OT Short Term Goal 5 (Week 1): Patient will be able to don brace with mod a; min vc's to direct caregiver to assist  Skilled Therapeutic Interventions/Progress Updates:   Pt participated in functional mobility task and transferred to toilet using RW.  Pt ambulated with RW to washer and dryer and loaded the washer.  Pt attempted balance activity on Wii but was unable to complete activity due to dimisnished balance and complexity of activity.   Pt engaged in therapeutic activity of tossing bags and retrieving items from floor using RW and reacher to address dynamic standing balance, endurance and functional mobility.  Pt c/o pain after activity, RN notified. Pt took frequent rest breaks throughout session. Focus on increasing activity tolerance, dynamic standing balance, endurance, transfers <--> ADL surfaces, and functional ambulation.    Therapy Documentation Precautions:  Precautions Precautions: Cervical;Back Precaution Comments: log roll! Required Braces or Orthoses: Cervical Brace;Spinal Brace Cervical Brace: At all times Spinal Brace: Thoracolumbosacral orthotic (OOB or HOB >30deg) Restrictions Weight Bearing Restrictions: No RLE Weight Bearing: Weight bearing as tolerated   Pain: Pt c/o pain 8/10 during activity, after repositioning pt pain level was 6/10 and RN made aware.     See FIM for current functional status  Therapy/Group: Individual Therapy  Shipley,Sheila 09/26/2013, 2:00 PM   Note reviewed and accurately reflects treatment session.

## 2013-09-26 NOTE — Progress Notes (Signed)
Recreational Therapy Session Note  Patient Details  Name: Michelle Zavala MRN: [id number] Date of Birth: 1946/04/18 Today's Date: 09/26/2013  Skilled Therapeutic Interventions/Progress Updates: Session focused on activity tolerance, ambulation with RW, dynamic standing balance during Wii, Just Dance game with supervision-min assist.  Rikki Trosper  09/26/2013, 12:18 PM
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
## 2013-09-27 ENCOUNTER — Inpatient Hospital Stay (HOSPITAL_COMMUNITY): Payer: Medicare Other | Admitting: *Deleted

## 2013-09-27 ENCOUNTER — Inpatient Hospital Stay (HOSPITAL_COMMUNITY): Payer: Medicare Other

## 2013-09-27 ENCOUNTER — Inpatient Hospital Stay (HOSPITAL_COMMUNITY): Payer: Medicare Other | Admitting: Speech Pathology

## 2013-09-27 ENCOUNTER — Encounter (HOSPITAL_COMMUNITY): Payer: Medicare Other

## 2013-09-27 NOTE — Progress Notes (Signed)
Speech Language Pathology Daily Session Notes  Patient Details  Name: Michelle Zavala MRN: [id number] Date of Birth: 1946/04/03  Today's Date: 09/27/2013  Session 1 Time: 1100-1130 Time Calculation (min): 30 min  Session 2 Time: 1430-1500 Time Calculation: 30 min   Short Term Goals: Week 1: SLP Short Term Goal 1 (Week 1): Pt will demonstrate functional problem solving for mildly complex tasks with Mod I.  SLP Short Term Goal 2 (Week 1): Pt will demonstrate selective attention in a mildly distracting envoirnment for 60 minutes with supervision verbal cues for redirection  SLP Short Term Goal 3 (Week 1): Pt will utilize external memory aids to recall new, daily information with supervision verbal and visual cues.  SLP Short Term Goal 4 (Week 1): Pt will utilize call bell to express wants/needs with Mod I.   Skilled Therapeutic Interventions:  Session 1: Skilled treatment session focused on cognitive goals. Upon arrival, pt asleep in bed. Pt independently directed care in order to transfer to recliner safely and was also to verbally direct care in regards to appropriate technique to donn TLSO. Pt also recalled back precautions with Mod I with use of external memory aids and demonstrated appropriate anticipatory awareness in regards to d/c planning. Continue plan of care.    Session 2: Skilled treatment session focused on pt/family education in regards to pt's current cognitive function and strategies to utilize at home to increase working memory, Surveyor, mining and medication management. Pt's sister and brother-in-law verbalized understanding of all information presented and asked appropriate questions. Continue plan of care.   FIM:  Comprehension Comprehension Mode: Auditory Comprehension: 5-Follows basic conversation/direction: With no assist Expression Expression Mode: Verbal Expression: 5-Expresses basic needs/ideas: With no assist Social Interaction Social Interaction: 6-Interacts  appropriately with others with medication or extra time (anti-anxiety, antidepressant). Problem Solving Problem Solving: 5-Solves basic problems: With no assist Memory Memory: 6-More than reasonable amt of time  Pain Pain Assessment Pain Assessment: No/denies pain   Therapy/Group: Individual Therapy  Eyvette Cordon 09/27/2013, 11:43 AM

## 2013-09-27 NOTE — Progress Notes (Signed)
Occupational Therapy Session Note  Patient Details  Name: SENNIE BORDEN MRN: [id number] Date of Birth: 03-04-1946  Today's Date: 09/27/2013 Time: 0700-0800 Time Calculation (min): 60 min  Short Term Goals: Week 1:  OT Short Term Goal 1 (Week 1): patient will require min a UB bathing in supine, mod a LB bathing sit to stand OT Short Term Goal 2 (Week 1): Patient will require max a toileting OT Short Term Goal 3 (Week 1): Patient will require min a UB dressing supine, mod  a LB dressing sit to stand OT Short Term Goal 4 (Week 1): Patient will require min vc's to use AE to assist with bathing and dressing OT Short Term Goal 5 (Week 1): Patient will be able to don brace with mod a; min vc's to direct caregiver to assist  Skilled Therapeutic Interventions/Progress Updates:    Pt was in bed upon arrival.  Pt required mod assist to donn/doff UB clothing and bathed UB while in bed.  Pt required total assistance to donn TLSO brace.  Pt transferred from bed to [facility] to toilet and completed perennial care with AE. Pt participated in LB bath/dress using AE in wc at sink.  Pt was able to donn/doff socks and pants using AE.  Pt did require few rest breaks and did c/o minimal pain in back after donn socks and was allowed to rest, RN notified.  Pt stood at sink to complete oral hygiene.    Pt transferred from wc>walker>recliner.  Focus on increasing edurance, standing balance and use of AE to complete ADLs with min assistance.    Therapy Documentation Precautions:  Precautions Precautions: Cervical;Back Precaution Comments: log roll! Required Braces or Orthoses: Cervical Brace;Spinal Brace Cervical Brace: At all times Spinal Brace: Thoracolumbosacral orthotic (OOB or HOB >30deg) Restrictions Weight Bearing Restrictions: No RLE Weight Bearing: Weight bearing as tolerated   Other Treatments:    See FIM for current functional status  Therapy/Group: Individual Therapy  Shipley,Sheila 09/27/2013, 10:38  AM Note reviewed and accurately reflects treatment session.

## 2013-09-27 NOTE — Progress Notes (Signed)
Occupational Therapy Session Note  Patient Details  Name: Michelle Zavala MRN: [id number] Date of Birth: 1945/11/09  Today's Date: 09/27/2013 Time: 1400-1430 Time Calculation (min): 30 min  Short Term Goals: Week 1:  OT Short Term Goal 1 (Week 1): patient will require min a UB bathing in supine, mod a LB bathing sit to stand OT Short Term Goal 2 (Week 1): Patient will require max a toileting OT Short Term Goal 3 (Week 1): Patient will require min a UB dressing supine, mod  a LB dressing sit to stand OT Short Term Goal 4 (Week 1): Patient will require min vc's to use AE to assist with bathing and dressing OT Short Term Goal 5 (Week 1): Patient will be able to don brace with mod a; min vc's to direct caregiver to assist  Skilled Therapeutic Interventions/Progress Updates:    Pt was lying iin bed upon arrival.  Pt participated in functional mobility activity using RW.  Pt participated in home mangement activities including reaching for items in cupboards, refrigerator and putting dishes in dishwasher.  Pt was educated on safety awareness of RW maneuvers and energy conservation techniques.  Pt required min verbal cues for safety awareness.   Pt transferred from standing w/ RW< > sofa.  Pt functionally ambulated with RW to pantry to retreive item, sat in chair to fold item and then replaced item in pantry using RW. Pt had no c/o pain.  Focus: Increasing activity tolerance, functional ambulation techniques, safety awareness, energy conservation, and dynamic standing balance.     Therapy Documentation Precautions:  Precautions Precautions: Cervical;Back Precaution Comments: log roll! Required Braces or Orthoses: Cervical Brace;Spinal Brace Cervical Brace: At all times Spinal Brace: Thoracolumbosacral orthotic (OOB or HOB >30deg) Restrictions Weight Bearing Restrictions: No RLE Weight Bearing: Weight bearing as tolerated       See FIM for current functional status  Therapy/Group: Individual  Therapy  Shipley,Sheila 09/27/2013, 2:47 PM  Note reviewed and accurately reflects treatment session.

## 2013-09-27 NOTE — Progress Notes (Signed)
Recreational Therapy Session Note  Patient Details  Name: SUELLEN DUROCHER MRN: [id number] Date of Birth: 03-03-1946 Today's Date: 09/27/2013  Pain: c/o HA 6/10, RN aware & medicine given Skilled Therapeutic Interventions/Progress Updates: Session focused on discharge planning & use of leisure time post discharge.  Pt performed bed mobility with supervision, ambulated with close supervision to BR for toileting.    Therapy/Group: Individual Therapy Alajiah Dutkiewicz 09/27/2013, 12:46 PM
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
## 2013-09-27 NOTE — Progress Notes (Signed)
Recreational Therapy Discharge Summary Patient Details  Name: Michelle Zavala MRN: [id number] Date of Birth: 04/03/1946 Today's Date: 09/27/2013  Long term goals set: 1  Long term goals met: 1  Comments on progress toward goals: Pt is scheduled for discharge on 1/17 home with boyfriend.  Pt is supervision level for simple TR tasks given occasional min verbal cues for safety/cognition.  Discussion/education with pt about potential activities for participation post discharge with focus on safety concerns & adherence to precautions.   Reasons for discharge: discharge from hospital Patient/family agrees with progress made and goals achieved: Yes  [name] 09/27/2013, 12:51 PM
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
## 2013-09-28 ENCOUNTER — Encounter (HOSPITAL_COMMUNITY): Payer: Medicare Other

## 2013-09-28 ENCOUNTER — Inpatient Hospital Stay (HOSPITAL_COMMUNITY): Payer: Medicare Other

## 2013-09-28 ENCOUNTER — Ambulatory Visit (HOSPITAL_COMMUNITY): Payer: Medicare Other | Admitting: Speech Pathology

## 2013-09-28 ENCOUNTER — Inpatient Hospital Stay (HOSPITAL_COMMUNITY): Payer: Medicare Other | Admitting: *Deleted

## 2013-09-28 DIAGNOSIS — S22009A Unspecified fracture of unspecified thoracic vertebra, initial encounter for closed fracture: Secondary | ICD-10-CM

## 2013-09-28 DIAGNOSIS — S2239XA Fracture of one rib, unspecified side, initial encounter for closed fracture: Secondary | ICD-10-CM

## 2013-09-28 DIAGNOSIS — S12100A Unspecified displaced fracture of second cervical vertebra, initial encounter for closed fracture: Secondary | ICD-10-CM

## 2013-09-28 LAB — CREATININE, SERUM
Creatinine, Ser: 0.68 mg/dL (ref 0.50–1.10)
GFR calc Af Amer: >90 mL/min (ref >90)
GFR calc non Af Amer: 88 mL/min — ABNORMAL LOW (ref >90)

## 2013-09-28 MED ORDER — PANTOPRAZOLE SODIUM 40 MG PO TBEC: 40.0000 mg | DELAYED_RELEASE_TABLET | Freq: Two times a day (BID) | ORAL | Status: DC

## 2013-09-28 MED ORDER — HYDROCHLOROTHIAZIDE 25 MG PO TABS: 25.0000 mg | ORAL_TABLET | Freq: Every day | ORAL | Status: DC

## 2013-09-28 MED ORDER — ADULT MULTIVITAMIN W/MINERALS CH: 1.0000 | ORAL_TABLET | Freq: Every day | ORAL | Status: DC

## 2013-09-28 MED ORDER — NITROFURANTOIN MONOHYD MACRO 100 MG PO CAPS: 100.0000 mg | ORAL_CAPSULE | Freq: Two times a day (BID) | ORAL | Status: DC

## 2013-09-28 MED ORDER — BUDESONIDE-FORMOTEROL FUMARATE 160-4.5 MCG/ACT IN AERO: 2.0000 | INHALATION_SPRAY | Freq: Two times a day (BID) | RESPIRATORY_TRACT | Status: DC

## 2013-09-28 MED ORDER — POTASSIUM CHLORIDE CRYS ER 20 MEQ PO TBCR: 20.0000 meq | EXTENDED_RELEASE_TABLET | Freq: Two times a day (BID) | ORAL | Status: DC

## 2013-09-28 MED ORDER — CHOLESTYRAMINE 4 GM/DOSE PO POWD: 1.0000 g | Freq: Two times a day (BID) | ORAL | Status: DC

## 2013-09-28 MED ORDER — METHOCARBAMOL 500 MG PO TABS: 500.0000 mg | ORAL_TABLET | Freq: Four times a day (QID) | ORAL | Status: DC | PRN

## 2013-09-28 MED ORDER — METOPROLOL SUCCINATE ER 50 MG PO TB24: 50.0000 mg | ORAL_TABLET | Freq: Every day | ORAL | Status: DC

## 2013-09-28 MED ORDER — OXYCODONE HCL 10 MG PO TABS: 10.0000 mg | ORAL_TABLET | ORAL | Status: DC | PRN

## 2013-09-28 MED ORDER — AMITRIPTYLINE HCL 75 MG PO TABS: 75.0000 mg | ORAL_TABLET | Freq: Every day | ORAL | Status: DC
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
## 2013-09-28 NOTE — Progress Notes (Signed)
Speech Language Pathology Discharge Summary  Patient Details  Name: Michelle Zavala MRN: [id number] Date of Birth: 08-19-1946  Today's Date: 09/28/2013  Pt missed 45 minutes of skilled SLP intervention due to discharging home before treatment session.   Patient has met 4 of 4 long term goals.  Patient to discharge at overall Modified Independent level.   Reasons goals not met: N/A   Clinical Impression/Discharge Summary: Pt has made functional gains and has met 4 of 4 LTG's this admission. Currently, pt is demonstrating behaviors consistent with a Rancho Level VIII and is overall Mod I for working memory of functional information and functional problem solving. Pt/family education complete and pt will discharge home with 24 hour supervision from family. Suspect pt is at her baseline cognitive function, therefore, skilled SLP f/u is not needed at this time.   Care Partner:  Caregiver Able to Provide Assistance: Yes  Type of Caregiver Assistance: Physical;Cognitive  Recommendation:  None      Equipment: N/A   Reasons for discharge: Treatment goals met;Discharged from hospital   Patient/Family Agrees with Progress Made and Goals Achieved: Yes   See FIM for current functional status  [name] 09/28/2013, 4:59 PM

## 2013-09-28 NOTE — Progress Notes (Signed)
Patient discharged home.  Left via wheelchair, escorted by nursing staff and family.  All patient belongings taken with patient.  Patient and family verbalized understanding of discharge instructions.  Appears to be in no immediate distress at this time.  Encouraged patient to stay and finish out therapies for today, but she declined.  Brita Romp, RN

## 2013-09-28 NOTE — Progress Notes (Signed)
Occupational Therapy Session Note  Patient Details  Name: Michelle Zavala MRN: [id number] Date of Birth: 04-02-1946  Today's Date: 09/28/2013 Time: 0730-0830 Time Calculation (min): 60 min  Short Term Goals: Week 1:  OT Short Term Goal 1 (Week 1): patient will require min a UB bathing in supine, mod a LB bathing sit to stand OT Short Term Goal 2 (Week 1): Patient will require max a toileting OT Short Term Goal 3 (Week 1): Patient will require min a UB dressing supine, mod  a LB dressing sit to stand OT Short Term Goal 4 (Week 1): Patient will require min vc's to use AE to assist with bathing and dressing OT Short Term Goal 5 (Week 1): Patient will be able to don brace with mod a; min vc's to direct caregiver to assist  Skilled Therapeutic Interventions/Progress Updates:    Pt in bed upon arrival.  Pt required total assist to donn TLSO.  Pt transferred to from bed to toilet using RW.  Pt sat in wc at sink to perform LB bath/dress utilizing AE.  Pt transferred back to bed and practiced bed mobility to doff TLSO and then bathe UB and donn/doff UB clothing.  Pt  required min assistance to donn button down shirt and required total assist again to donn TLSO.  Pt transferred bed<> recliner to rest.  Pt required min rest breaks throughout entire session and had no c/o pain.  Focus: Increasing endurance and functional mobility, safety awareness, and increased independence with ADL tasks.  Therapy Documentation Precautions:  Precautions Precautions: Cervical;Back Precaution Comments: log roll! Required Braces or Orthoses: Cervical Brace;Spinal Brace Cervical Brace: At all times Spinal Brace: Thoracolumbosacral orthotic (OOB or HOB >30deg) Restrictions Weight Bearing Restrictions: No RLE Weight Bearing: Weight bearing as tolerated    Pain: Pain Assessment Pain Assessment: 0-10 Pain Score: 5  Pain Type: Acute pain Pain Location: Head Pain Orientation: Right Pain Descriptors / Indicators:  Headache Pain Onset: Gradual Patients Stated Pain Goal: 0 Pain Intervention(s): Repositioned;RN made aware    See FIM for current functional status  Therapy/Group: Individual Therapy  Shipley,Sheila 09/28/2013, 10:24 AM  Note reviewed and accurately reflects treatment session.
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
## 2013-09-28 NOTE — Progress Notes (Signed)
Physical Therapy Discharge Summary  Patient Details  Name: Michelle Zavala MRN: [id number] Date of Birth: [birth year]  Today's Date: 09/28/2013 Time: 1000-1100 Time Calculation (min): 60 min  Patient has met 10 of 10 long term goals due to improved activity tolerance, improved balance, increased strength, decreased pain and ability to compensate for deficits.  Patient to discharge at an ambulatory level Supervision to min A. Patient's brother and sister-in-law are independent to provide the necessary physical and cognitive assistance at discharge.  Reasons goals not met: N/A  Recommendation:  Patient will benefit from ongoing skilled PT services in home health setting to continue to advance safe functional mobility, address ongoing impairments in decreased functional endurance, decreased balance, decreased strength, precautions and minimize fall risk.  Equipment: Hospital bed, wheelchair, RW and [facility]  Reasons for discharge: treatment goals met and discharge from hospital  Patient/family agrees with progress made and goals achieved: Yes  Skilled Therapeutic Interventions 1:1. Pt received semi-reclined in bed, ready for therapy. Pt able to demonstrate bed mobility (w/ use of hospital bed functions), amb, stair negotiation, SPT and dynamic standing balance w/ use of RW and overall (S). Pt continues to demonstrate difficulty recalling back precautions w/ min verbal and external cues. Pt performed 1x10 reps of seated/standing HEP w/ handout provided. Orthotist present at end of session to deliver extra set of cervical collar pads as well as demonstrate pad removal/replacement to pt while in supine positioning. Pt semi-reclined in bed at end of session w/ all needs in reach and bed alarm on.   PT Discharge Precautions/Restrictions Precautions Precautions: Cervical;Back Precaution Comments: log roll! Required Braces or Orthoses: Cervical Brace;Spinal Brace Cervical Brace: At all times Spinal  Brace: Thoracolumbosacral orthotic (OOB or HOB >30deg) Restrictions Weight Bearing Restrictions: No RLE Weight Bearing: Weight bearing as tolerated Vital Signs Oxygen Therapy SpO2: 94 % O2 Device: None (Room air) Pain Pain Assessment Pain Assessment: 0-10 Pain Score: 5  Pain Type: Acute pain Pain Location: Head Pain Orientation: Right Pain Descriptors / Indicators: Headache Pain Onset: Gradual Patients Stated Pain Goal: 0 Pain Intervention(s): Repositioned;RN made aware Vision/Perception  Vision - History Baseline Vision: Wears glasses only for reading Patient Visual Report: No change from baseline Perception Perception: Within Functional Limits Praxis Praxis: Intact  Cognition Overall Cognitive Status: Impaired/Different from baseline Arousal/Alertness: Lethargic Orientation Level: Oriented X4 Attention: Focused;Sustained;Selective;Alternating Focused Attention: Appears intact Sustained Attention: Appears intact Selective Attention: Appears intact Alternating Attention: Impaired Alternating Attention Impairment: Functional basic Memory: Impaired Memory Impairment: Decreased recall of new information;Decreased short term memory Decreased Short Term Memory: Verbal basic;Functional basic Awareness: Impaired Awareness Impairment: Intellectual impairment;Emergent impairment Problem Solving: Impaired Problem Solving Impairment: Verbal basic;Functional basic Safety/Judgment: Impaired Comments: Pt continues to demonstrate difficulty recalling back precautions but initiates bed roll w/out cueing [employer] Scales of Cognitive Functioning: Purposeful/appropriate Sensation Sensation Light Touch: Appears Intact Proprioception: Appears Intact Coordination Gross Motor Movements are Fluid and Coordinated: Yes Fine Motor Movements are Fluid and Coordinated: Yes Motor  Motor Motor - Discharge Observations: Generalized weakness, movement limited by braces  Mobility Bed  Mobility Bed Mobility: Rolling Right;Rolling Left;Right Sidelying to Sit;Left Sidelying to Sit Rolling Right: With rail;5: Supervision Rolling Right Details: Verbal cues for precautions/safety;Verbal cues for technique Rolling Left: 5: Supervision;With rail Rolling Left Details: Verbal cues for technique;Verbal cues for precautions/safety;Verbal cues for sequencing Right Sidelying to Sit: 5: Supervision;With rails;HOB elevated;HOB flat;4: Min assist Right Sidelying to Sit Details: Verbal cues for sequencing;Verbal cues for technique;Verbal cues for precautions/safety Left Sidelying to Sit:  5: Supervision;4: Min assist;With rails;HOB flat;HOB elevated Left Sidelying to Sit Details: Verbal cues for sequencing;Verbal cues for technique;Verbal cues for precautions/safety Transfers Transfers: Yes Stand Pivot Transfers: 5: Supervision;With armrests Stand Pivot Transfer Details: Verbal cues for precautions/safety Locomotion  Ambulation Ambulation: Yes Ambulation/Gait Assistance: 5: Supervision Ambulation Distance (Feet): 150 Feet Assistive device: Rolling walker Ambulation/Gait Assistance Details: Verbal cues for precautions/safety;Verbal cues for safe use of DME/AE Gait Gait: Yes Gait Pattern: Impaired Gait Pattern: Step-through pattern;Decreased hip/knee flexion - left;Decreased hip/knee flexion - right;Decreased dorsiflexion - right;Decreased dorsiflexion - left;Wide base of support Gait velocity: decreased Stairs / Additional Locomotion Stairs: Yes Stairs Assistance: 5: Supervision;4: Min guard Stairs Assistance Details: Verbal cues for precautions/safety Stair Management Technique: One rail Right;Step to pattern;Sideways Number of Stairs: 10 Wheelchair Mobility Wheelchair Mobility: Yes Wheelchair Assistance: 3: Mod Lexicographer: Both lower extermities Wheelchair Parts Management: Needs assistance Distance: 150'  Trunk/Postural Assessment  Cervical  Assessment Cervical Assessment: Exceptions to WFL (C2 fx w/ aspen collar on at all times) Thoracic Assessment Thoracic Assessment: Exceptions to WFL (Pt req TLSO if HOB>30deg or OOB) Lumbar Assessment Lumbar Assessment: Within Functional Limits Postural Control Postural Control: Within Functional Limits  Balance Balance Balance Assessed: Yes Static Sitting Balance Static Sitting - Balance Support: Left upper extremity supported;Right upper extremity supported;Bilateral upper extremity supported;Feet supported Static Sitting - Level of Assistance: 6: Modified independent (Device/Increase time) Dynamic Sitting Balance Dynamic Sitting - Balance Support: Left upper extremity supported;Right upper extremity supported;Bilateral upper extremity supported Dynamic Sitting - Level of Assistance: 6: Modified independent (Device/Increase time) Dynamic Sitting - Balance Activities: Forward lean/weight shifting;Lateral lean/weight shifting;Reaching for weighted objects Static Standing Balance Static Standing - Balance Support: Bilateral upper extremity supported;During functional activity Static Standing - Level of Assistance: 5: Stand by assistance Dynamic Standing Balance Dynamic Standing - Balance Support: Bilateral upper extremity supported;Left upper extremity supported;Right upper extremity supported Dynamic Standing - Level of Assistance: 5: Stand by assistance Dynamic Standing - Balance Activities: Forward lean/weight shifting;Reaching across midline;Reaching for objects;Lateral lean/weight shifting Extremity Assessment      RLE Assessment RLE Assessment: Within Functional Limits RLE Strength RLE Overall Strength Comments: Grossly 4/5 LLE Assessment LLE Assessment: Within Functional Limits LLE Strength LLE Overall Strength Comments: Grossly 4 to 4+/5  See FIM for current functional status  Gilmore Laroche 09/28/2013, 12:25 PM

## 2013-09-28 NOTE — Progress Notes (Signed)
Social Work Patient ID: Michelle Zavala, female   DOB: Dec 21, 1945, 68 y.o.   MRN: [id number]  Met with pt, boyfriend [name]), sister [name]) and brother-in-law [name]) yesterday afternoon to review d/c plans.  Per therapies, the family education sessions clearly pointed out that pt's boyfriend is not capable of understanding or meeting pt's care needs at home.  Pt and family are agreed with these concerns and have changed d/c plan for pt to d/c home with sister and brother-in-law who can provide 24/7 assistance.  Therapies report education with sister and brother-in-law went well and they were able to demonstrate full competency to provide assist to pt and manage her brace.  Discussed with all the DME and [place] f/u needs with plans to d/c 1/17.  Per pt today, MD told her this morning that she could dc home today if arrangements in place.  Have contacted pt's sister, [name], who reports they can make arrangements to pick pt up after lunch.  They will need to make some adjustments in their schedules, however, they can make this work for today.  Will alert DME and HH as to earlier d/c as well as treatment team.  Donata Clay, Eldine Rencher, LCSW

## 2013-09-28 NOTE — Discharge Instructions (Signed)
Inpatient Rehab Discharge Instructions  Michelle Zavala Discharge date and time: No discharge date for patient encounter.   Activities/Precautions/ Functional Status: Activity: Cervical brace at all times. TLSO brace when out of bed or sitting up Diet: regular diet Wound Care: none needed Functional status:  ___ No restrictions     ___ Walk up steps independently ___ 24/7 supervision/assistance   ___ Walk up steps with assistance ___ Intermittent supervision/assistance  ___ Bathe/dress independently ___ Walk with walker     ___ Bathe/dress with assistance ___ Walk Independently    ___ Shower independently _x__ Walk with assistance    ___ Shower with assistance ___ No alcohol     ___ Return to work/school ________   COMMUNITY REFERRALS UPON DISCHARGE:    Home Health:   PT     OT                     Agency: [facility]  Phone: [phone]   Medical Equipment/Items Ordered: wheelchair, cushion, hospital bed, walker, commode                                                    Agency/Supplier: [facility] @ [phone]        Special Instructions:    My questions have been answered and I understand these instructions. I will adhere to these goals and the provided educational materials after my discharge from the hospital.  Patient/Caregiver Signature _______________________________ Date __________  Clinician Signature _______________________________________ Date __________  Please bring this form and your medication list with you to all your follow-up doctor's appointments.

## 2013-09-28 NOTE — Discharge Summary (Signed)
Discharge summary job [phone]

## 2013-09-28 NOTE — Progress Notes (Signed)
Social Work Discharge Note  The overall goal for the admission was met for:   Discharge location: Yes - home with sister and brother-in-law (change in original plan)  Length of Stay: Yes - 7 days  Discharge activity level: Yes - supervision to minimal assist  Home/community participation: Yes  Services provided included: MD, RD, PT, OT, RN, TR, Pharmacy, [place]: Medicare and Medicaid  Follow-up services arranged: Home Health: PT, OT via [place], DME: 20x16 lightweight w/c, cushion, rolling walker, semi elec hospital bed, 3n1 commode all via [place] and Patient/Family has no preference for HH/DME agencies  Comments (or additional information):  Patient/Family verbalized understanding of follow-up arrangements: Yes  Individual responsible for coordination of the follow-up plan: patient  Confirmed correct DME delivered: Jamielyn Petrucci 09/28/2013    Nathifa Ritthaler

## 2013-10-12 DIAGNOSIS — J4489 Other specified chronic obstructive pulmonary disease: Secondary | ICD-10-CM | POA: Diagnosis not present

## 2013-10-12 DIAGNOSIS — Z5189 Encounter for other specified aftercare: Secondary | ICD-10-CM | POA: Diagnosis not present

## 2013-10-12 DIAGNOSIS — IMO0002 Reserved for concepts with insufficient information to code with codable children: Secondary | ICD-10-CM | POA: Diagnosis not present

## 2013-10-12 DIAGNOSIS — J449 Chronic obstructive pulmonary disease, unspecified: Secondary | ICD-10-CM

## 2013-10-12 DIAGNOSIS — IMO0001 Reserved for inherently not codable concepts without codable children: Secondary | ICD-10-CM | POA: Diagnosis not present

## 2013-11-28 ENCOUNTER — Other Ambulatory Visit: Payer: Self-pay

## 2013-12-07 ENCOUNTER — Other Ambulatory Visit: Payer: Self-pay | Admitting: Orthopaedic Surgery

## 2013-12-07 DIAGNOSIS — T148XXA Other injury of unspecified body region, initial encounter: Secondary | ICD-10-CM

## 2013-12-14 ENCOUNTER — Ambulatory Visit
Admission: RE | Admit: 2013-12-14 | Discharge: 2013-12-14 | Disposition: A | Discharge status: Home / Self Care | Payer: Medicare Other | Source: Ambulatory Visit | Attending: Orthopaedic Surgery | Admitting: Orthopaedic Surgery

## 2013-12-14 DIAGNOSIS — T148XXA Other injury of unspecified body region, initial encounter: Secondary | ICD-10-CM

## 2013-12-20 ENCOUNTER — Other Ambulatory Visit: Payer: Self-pay | Admitting: Orthopaedic Surgery

## 2013-12-20 DIAGNOSIS — S0990XA Unspecified injury of head, initial encounter: Secondary | ICD-10-CM

## 2013-12-20 DIAGNOSIS — R519 Headache, unspecified: Secondary | ICD-10-CM

## 2013-12-20 DIAGNOSIS — R51 Headache: Principal | ICD-10-CM

## 2013-12-20 DIAGNOSIS — T148XXA Other injury of unspecified body region, initial encounter: Secondary | ICD-10-CM

## 2013-12-24 ENCOUNTER — Other Ambulatory Visit: Payer: Medicare Other

## 2013-12-25 ENCOUNTER — Other Ambulatory Visit: Payer: Medicare Other

## 2013-12-25 ENCOUNTER — Ambulatory Visit
Admission: RE | Admit: 2013-12-25 | Discharge: 2013-12-25 | Disposition: A | Discharge status: Home / Self Care | Payer: Medicare Other | Source: Ambulatory Visit | Attending: Orthopaedic Surgery | Admitting: Orthopaedic Surgery

## 2013-12-25 DIAGNOSIS — T148XXA Other injury of unspecified body region, initial encounter: Secondary | ICD-10-CM

## 2013-12-25 DIAGNOSIS — S0990XA Unspecified injury of head, initial encounter: Secondary | ICD-10-CM

## 2013-12-25 DIAGNOSIS — R51 Headache: Principal | ICD-10-CM

## 2013-12-25 DIAGNOSIS — R519 Headache, unspecified: Secondary | ICD-10-CM

## 2014-01-31 ENCOUNTER — Other Ambulatory Visit: Payer: Self-pay | Admitting: Orthopaedic Surgery

## 2014-01-31 DIAGNOSIS — IMO0002 Reserved for concepts with insufficient information to code with codable children: Secondary | ICD-10-CM

## 2014-01-31 DIAGNOSIS — R229 Localized swelling, mass and lump, unspecified: Principal | ICD-10-CM

## 2014-02-05 ENCOUNTER — Ambulatory Visit
Admission: RE | Admit: 2014-02-05 | Discharge: 2014-02-05 | Disposition: A | Discharge status: Home / Self Care | Payer: Medicare Other | Source: Ambulatory Visit | Attending: Orthopaedic Surgery | Admitting: Orthopaedic Surgery

## 2014-02-05 DIAGNOSIS — IMO0002 Reserved for concepts with insufficient information to code with codable children: Secondary | ICD-10-CM

## 2014-02-05 DIAGNOSIS — R229 Localized swelling, mass and lump, unspecified: Principal | ICD-10-CM

## 2014-02-28 ENCOUNTER — Ambulatory Visit (INDEPENDENT_AMBULATORY_CARE_PROVIDER_SITE_OTHER): Payer: 59 | Admitting: Neurology

## 2014-02-28 ENCOUNTER — Encounter: Payer: Self-pay | Admitting: Neurology

## 2014-02-28 VITALS — BP 130/92 | HR 92 | Temp 96.8°F | Ht 65.0 in | Wt 206.0 lb

## 2014-02-28 DIAGNOSIS — IMO0002 Reserved for concepts with insufficient information to code with codable children: Secondary | ICD-10-CM

## 2014-02-28 DIAGNOSIS — S12100A Unspecified displaced fracture of second cervical vertebra, initial encounter for closed fracture: Secondary | ICD-10-CM

## 2014-02-28 DIAGNOSIS — M542 Cervicalgia: Secondary | ICD-10-CM

## 2014-02-28 DIAGNOSIS — R519 Headache, unspecified: Secondary | ICD-10-CM

## 2014-02-28 DIAGNOSIS — R51 Headache: Secondary | ICD-10-CM

## 2014-02-28 DIAGNOSIS — M792 Neuralgia and neuritis, unspecified: Secondary | ICD-10-CM
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
## 2014-02-28 NOTE — Patient Instructions (Signed)
I would like to suggest the following:   We will do an MRI brain. I would recommend that you continue with physical therapy and consider repeat nerve block with Dr. Louanne Skye and his partner. I do not think Botox injections will help you.   I am afraid, there is not a whole lot I maybe able to do for you. Reassuringly, your neurological exam is non-focal. We will call you with your MRI results.

## 2014-03-12 ENCOUNTER — Other Ambulatory Visit: Payer: Self-pay | Admitting: Specialist

## 2014-03-12 ENCOUNTER — Other Ambulatory Visit: Payer: Self-pay | Admitting: Neurology

## 2014-03-12 DIAGNOSIS — R51 Headache: Secondary | ICD-10-CM

## 2014-03-12 DIAGNOSIS — M545 Low back pain, unspecified: Secondary | ICD-10-CM

## 2014-03-12 DIAGNOSIS — M792 Neuralgia and neuritis, unspecified: Secondary | ICD-10-CM

## 2014-03-12 DIAGNOSIS — R519 Headache, unspecified: Secondary | ICD-10-CM

## 2014-03-12 DIAGNOSIS — S12100A Unspecified displaced fracture of second cervical vertebra, initial encounter for closed fracture: Secondary | ICD-10-CM

## 2014-03-12 DIAGNOSIS — M542 Cervicalgia: Secondary | ICD-10-CM

## 2014-03-14 ENCOUNTER — Ambulatory Visit
Admission: RE | Admit: 2014-03-14 | Discharge: 2014-03-14 | Disposition: A | Discharge status: Home / Self Care | Payer: Medicare Other | Source: Ambulatory Visit | Attending: Specialist | Admitting: Specialist

## 2014-03-14 ENCOUNTER — Ambulatory Visit
Admission: RE | Admit: 2014-03-14 | Discharge: 2014-03-14 | Disposition: A | Discharge status: Home / Self Care | Payer: Medicare Other | Source: Ambulatory Visit | Attending: Neurology | Admitting: Neurology

## 2014-03-14 DIAGNOSIS — M545 Low back pain, unspecified: Secondary | ICD-10-CM

## 2014-03-14 DIAGNOSIS — M792 Neuralgia and neuritis, unspecified: Secondary | ICD-10-CM

## 2014-03-14 DIAGNOSIS — M542 Cervicalgia: Secondary | ICD-10-CM

## 2014-03-14 DIAGNOSIS — R51 Headache: Secondary | ICD-10-CM

## 2014-03-14 DIAGNOSIS — R519 Headache, unspecified: Secondary | ICD-10-CM

## 2014-03-14 DIAGNOSIS — S12100A Unspecified displaced fracture of second cervical vertebra, initial encounter for closed fracture: Secondary | ICD-10-CM

## 2014-03-14 MED ORDER — GADOBENATE DIMEGLUMINE 529 MG/ML IV SOLN
19.0000 mL | Freq: Once | INTRAVENOUS | Status: AC | PRN
  Administered 2014-03-14: 19 mL via INTRAVENOUS

## 2014-03-19 NOTE — Progress Notes (Signed)
Quick Note:  Please call patient regarding the recent brain MRI: The brain scan showed a normal structure of the brain and no significant volume loss which we call atrophy. There were changes in the deeper structures of the brain, which we call white matter changes or microvascular changes. These were reported as mild in Her case. These are tiny white spots, that occur with time and are seen in a variety of conditions, including with normal aging, chronic hypertension, chronic headaches, especially migraine HAs, chronic diabetes, chronic hyperlipidemia. These are not strokes and no mass or lesion or contrast enhancement was seen which is reassuring. Again, there were no acute findings, such as a stroke, or mass or blood products. No further action is required on this test at this time, other than re-enforcing the importance of good blood pressure control, good cholesterol control, good blood sugar control, and weight management. Overall, compared to her last MRI from July 2007, there was no significant change reported, which is reassuring. Please remind patient to keep any upcoming appointments or tests and to call us with any interim questions, concerns, problems or updates. Thanks,  [name], MD, PhD    ______

## 2014-03-20 NOTE — Progress Notes (Signed)
Quick Note:  I called and LMVM for pt of results of MRI, (no change from previous MRI 2013). She is to call back if questions. ______

## 2014-06-11 DIAGNOSIS — M542 Cervicalgia: Secondary | ICD-10-CM | POA: Insufficient documentation

## 2014-06-11 DIAGNOSIS — S129XXA Fracture of neck, unspecified, initial encounter: Secondary | ICD-10-CM | POA: Insufficient documentation

## 2014-07-12 ENCOUNTER — Encounter: Payer: Self-pay | Admitting: Cardiology

## 2014-09-20 ENCOUNTER — Encounter: Payer: Self-pay | Admitting: Cardiology

## 2014-09-20 ENCOUNTER — Ambulatory Visit (INDEPENDENT_AMBULATORY_CARE_PROVIDER_SITE_OTHER): Payer: Medicare Other | Admitting: Cardiology

## 2014-09-20 VITALS — BP 138/88 | HR 91 | Ht 65.0 in | Wt 206.0 lb

## 2014-09-20 DIAGNOSIS — I471 Supraventricular tachycardia: Secondary | ICD-10-CM

## 2014-09-20 DIAGNOSIS — I728 Aneurysm of other specified arteries: Secondary | ICD-10-CM

## 2014-09-20 DIAGNOSIS — I779 Disorder of arteries and arterioles, unspecified: Secondary | ICD-10-CM

## 2014-09-20 DIAGNOSIS — I739 Peripheral vascular disease, unspecified: Principal | ICD-10-CM

## 2014-09-20 DIAGNOSIS — I726 Aneurysm of vertebral artery: Secondary | ICD-10-CM

## 2014-09-20 DIAGNOSIS — I2510 Atherosclerotic heart disease of native coronary artery without angina pectoris: Secondary | ICD-10-CM

## 2014-09-20 MED ORDER — METOPROLOL SUCCINATE ER 100 MG PO TB24: 100.0000 mg | ORAL_TABLET | Freq: Every day | ORAL | Status: DC

## 2014-09-20 NOTE — Assessment & Plan Note (Signed)
No active angina symptoms, atypical musculoskeletal-sounding left thoracic pain described. ECG reviewed and stable. She has prior history of mild coronary atherosclerosis in 2010. Blocker being resumed.

## 2014-09-20 NOTE — Patient Instructions (Signed)

## 2014-09-20 NOTE — Assessment & Plan Note (Signed)
Sinus rhythm noted today, although heart rate is in the 90s. She has been out of beta blocker since November. Encouraged to refill prior Toprol-XL dose and continue observation.
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
## 2014-09-20 NOTE — Addendum Note (Signed)
Addended by: Merlene Laughter on: 09/20/2014 12:20 PM   Modules accepted: Level of Service

## 2014-09-22 ENCOUNTER — Telehealth: Payer: Self-pay | Admitting: Neurology

## 2014-09-22 NOTE — Telephone Encounter (Signed)
Pls call Pt. Dr. Louanne Skye is referring patient back with the same condition for which I saw her in June 2015, at which time I unfortunately did not have a whole lot to offer to her. I explained the limitations in treating her complicated condition at the time. He should have received my office note from that visit. I'm not quite sure what the reason is for re-referral, as I did not have much to offer last time. I would like for the patient at the appropriate help but do not see how of visit would help with me at this time. Please inquire from patient. Appt for 1 PM on 09/23/14, but I am not sure it is going to be fruitful for patient and would like to save her this appointment if possible. Pls call her ASAP.

## 2014-09-23 ENCOUNTER — Encounter: Payer: Self-pay | Admitting: Neurology

## 2014-09-23 ENCOUNTER — Ambulatory Visit (INDEPENDENT_AMBULATORY_CARE_PROVIDER_SITE_OTHER): Payer: Self-pay | Admitting: Neurology

## 2014-09-23 VITALS — BP 136/93 | HR 89 | Temp 98.1°F | Ht 63.0 in | Wt 208.0 lb

## 2014-09-23 DIAGNOSIS — S12100S Unspecified displaced fracture of second cervical vertebra, sequela: Secondary | ICD-10-CM

## 2014-09-23 DIAGNOSIS — M542 Cervicalgia: Secondary | ICD-10-CM
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
## 2014-09-23 NOTE — Telephone Encounter (Signed)
Called patient but could not reach, left VM message to return call back to our office

## 2014-10-15 ENCOUNTER — Emergency Department (HOSPITAL_COMMUNITY)
Admission: EM | Admit: 2014-10-15 | Discharge: 2014-10-15 | Disposition: A | Discharge status: Home / Self Care | Payer: Medicare Other | Attending: Emergency Medicine | Admitting: Emergency Medicine

## 2014-10-15 ENCOUNTER — Encounter (HOSPITAL_COMMUNITY): Payer: Self-pay | Admitting: Emergency Medicine

## 2014-10-15 DIAGNOSIS — Z79899 Other long term (current) drug therapy: Secondary | ICD-10-CM | POA: Diagnosis not present

## 2014-10-15 DIAGNOSIS — I10 Essential (primary) hypertension: Secondary | ICD-10-CM | POA: Diagnosis not present

## 2014-10-15 DIAGNOSIS — F329 Major depressive disorder, single episode, unspecified: Secondary | ICD-10-CM | POA: Diagnosis not present

## 2014-10-15 DIAGNOSIS — F419 Anxiety disorder, unspecified: Secondary | ICD-10-CM | POA: Insufficient documentation

## 2014-10-15 DIAGNOSIS — H9202 Otalgia, left ear: Secondary | ICD-10-CM | POA: Insufficient documentation

## 2014-10-15 DIAGNOSIS — I471 Supraventricular tachycardia: Secondary | ICD-10-CM | POA: Insufficient documentation

## 2014-10-15 DIAGNOSIS — K219 Gastro-esophageal reflux disease without esophagitis: Secondary | ICD-10-CM | POA: Insufficient documentation

## 2014-10-15 DIAGNOSIS — H9222 Otorrhagia, left ear: Secondary | ICD-10-CM | POA: Insufficient documentation

## 2014-10-15 DIAGNOSIS — Z7951 Long term (current) use of inhaled steroids: Secondary | ICD-10-CM | POA: Diagnosis not present

## 2014-10-15 DIAGNOSIS — R519 Headache, unspecified: Secondary | ICD-10-CM

## 2014-10-15 DIAGNOSIS — G8929 Other chronic pain: Secondary | ICD-10-CM | POA: Diagnosis not present

## 2014-10-15 DIAGNOSIS — R51 Headache: Secondary | ICD-10-CM | POA: Insufficient documentation

## 2014-10-15 DIAGNOSIS — H9201 Otalgia, right ear: Secondary | ICD-10-CM

## 2014-10-15 MED ORDER — CIPROFLOXACIN-DEXAMETHASONE 0.3-0.1 % OT SUSP
4.0000 [drp] | Freq: Once | OTIC | Status: AC
  Administered 2014-10-15: 4 [drp] via OTIC
  Filled 2014-10-15: qty 7.5

## 2014-10-15 MED ORDER — CIPROFLOXACIN-DEXAMETHASONE 0.3-0.1 % OT SUSP: 4.0000 [drp] | Freq: Two times a day (BID) | OTIC | Status: DC
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
## 2014-10-15 NOTE — ED Provider Notes (Signed)
CSN: [id number]     Arrival date & time 10/15/14  1431 History   First MD Initiated Contact with Patient 10/15/14 1625     Chief Complaint  Patient presents with  . Otalgia  . Headache     (Consider location/radiation/quality/duration/timing/severity/associated sxs/prior Treatment) Patient is a 69 y.o. female presenting with ear pain. The history is provided by the patient, a relative and a friend. No language interpreter was used.  Otalgia Location:  Right Behind ear:  No abnormality Quality: itching. Severity:  Mild Onset quality:  Gradual Duration:  3 days Timing:  Constant Progression:  Unchanged Chronicity:  New Context: not direct blow, not elevation change, not foreign body in ear, not loud noise and no water in ear   Relieved by:  Nothing Worsened by:  Nothing tried Ineffective treatments:  None tried Associated symptoms: ear discharge (small amount of blood on Q tips for the past two days.) and headaches   Associated symptoms: no abdominal pain, no cough, no fever, no rhinorrhea, no sore throat and no vomiting   Headaches:    Severity:  Mild   Onset quality:  Gradual   Duration:  12 months   Timing:  Constant   Progression:  Unchanged   Chronicity:  Chronic   Past Medical History  Diagnosis Date  . Coronary atherosclerosis of native coronary artery     Mild atherosclerosis 3/10, LVEF 60-65%  . Depression   . GERD (gastroesophageal reflux disease)   . Diverticulosis   . Anxiety   . Chronic lung disease     Fibrosis - Dr. Koleen Nimrod  . Essential hypertension, benign   . PSVT (paroxysmal supraventricular tachycardia)    Past Surgical History  Procedure Laterality Date  . Cholecystectomy    . Cesarean section    . Total knee arthroplasty    . Anterior release vertebral body w/ posterior fusion    . Abdominal hysterectomy     Family History  Problem Relation Age of Onset  . Coronary artery disease Father     Premature  . Coronary artery disease Brother      Premature  . Coronary artery disease Sister     Premature   History  Substance Use Topics  . Smoking status: Never Smoker   . Smokeless tobacco: Never Used  . Alcohol Use: No   OB History    No data available     Review of Systems  Constitutional: Negative for fever.  HENT: Positive for ear discharge (small amount of blood on Q tips for the past two days.) and ear pain. Negative for rhinorrhea and sore throat.   Respiratory: Negative for cough.   Gastrointestinal: Negative for vomiting and abdominal pain.  Genitourinary: Negative for dysuria, urgency and frequency.  Neurological: Positive for headaches. Negative for weakness and numbness.  All other systems reviewed and are negative.     Allergies  Sulfonamide derivatives  Home Medications   Prior to Admission medications   Medication Sig Start Date End Date Taking? Authorizing Provider  budesonide-formoterol (SYMBICORT) 160-4.5 MCG/ACT inhaler Inhale 2 puffs into the lungs 2 (two) times daily. 09/28/13   Lavon Paganini Angiulli, PA-C  cholestyramine Lucrezia Starch) 4 GM/DOSE powder Take 0.5 packets (2 g total) by mouth 2 (two) times daily with a meal. 09/28/13   Lavon Paganini Angiulli, PA-C  ciprofloxacin-dexamethasone (CIPRODEX) otic suspension Place 4 drops into the right ear 2 (two) times daily. 10/15/14   Katheren Shams, MD  fluticasone (FLONASE) 50 MCG/ACT nasal spray Place 1 spray  into the nose daily as needed for allergies.  03/15/13   Historical Provider, MD  hydrochlorothiazide (HYDRODIURIL) 25 MG tablet Take 1 tablet (25 mg total) by mouth daily. 09/28/13   Lavon Paganini Angiulli, PA-C  HYDROcodone-acetaminophen (NORCO) 7.5-325 MG per tablet Take 1 tablet by mouth every 6 (six) hours as needed. 02/15/14   Historical Provider, MD  LORazepam (ATIVAN) 2 MG tablet Take 2-4 mg by mouth See admin instructions. Take1 tablets in AM and 1 tablet around noon if needed and 2 tablets at bedtime    Historical Provider, MD  metoprolol succinate (TOPROL-XL)  100 MG 24 hr tablet Take 1 tablet (100 mg total) by mouth daily. 09/20/14   Satira Sark, MD  Multiple Vitamin (MULTIVITAMIN WITH MINERALS) TABS tablet Take 1 tablet by mouth daily. 09/28/13   Lavon Paganini Angiulli, PA-C  pantoprazole (PROTONIX) 40 MG tablet Take 1 tablet (40 mg total) by mouth 2 (two) times daily. 09/28/13   Lavon Paganini Angiulli, PA-C  potassium chloride SA (K-DUR,KLOR-CON) 20 MEQ tablet Take 1 tablet (20 mEq total) by mouth 2 (two) times daily. Patient taking differently: Take 20 mEq by mouth 2 (two) times daily. As needed 09/28/13   Lavon Paganini Angiulli, PA-C  PREMARIN 0.625 MG tablet Take 1 tablet by mouth daily. 02/23/14   Historical Provider, MD  PROAIR HFA 108 (90 BASE) MCG/ACT inhaler Inhale 2 puffs into the lungs every 6 (six) hours as needed for shortness of breath.  08/15/12   Historical Provider, MD  sertraline (ZOLOFT) 100 MG tablet Take 100 mg by mouth daily.      Historical Provider, MD  Vitamin D, Ergocalciferol, (DRISDOL) 50000 UNITS CAPS capsule Take 50,000 Units by mouth 2 (two) times a week. Take on Wednesday and Saturday.    Historical Provider, MD   BP 103/70 mmHg  Pulse 67  Temp(Src) 97.9 F (36.6 C) (Oral)  Resp 18  SpO2 97% Physical Exam  Constitutional: She is oriented to person, place, and time. She appears well-developed and well-nourished. No distress.  HENT:  Head: Normocephalic and atraumatic.  Right Ear: Hearing normal. No lacerations. No drainage or tenderness. Tympanic membrane is scarred. Tympanic membrane is not injected and not bulging. No middle ear effusion. No decreased hearing is noted.  Left Ear: Hearing, tympanic membrane, external ear and ear canal normal.  Small abrasion to the floor of the R auditory canal.  Small abrasion to the R tympanic membrane.    Eyes: Pupils are equal, round, and reactive to light.  Neck: Normal range of motion.  Cardiovascular: Normal rate, regular rhythm, normal heart sounds and intact distal pulses.    Pulmonary/Chest: Effort normal. No respiratory distress. She has no wheezes. She exhibits no tenderness.  Abdominal: Soft. Bowel sounds are normal. She exhibits no distension. There is no tenderness. There is no rebound and no guarding.  Neurological: She is alert and oriented to person, place, and time. She has normal strength. No cranial nerve deficit or sensory deficit. She exhibits normal muscle tone. Coordination and gait normal.  Strength 5/5 bilateral upper and lower extremities.  Sensation intact x4 extremities.  CN II-XII intact.    Skin: Skin is warm and dry.  Nursing note and vitals reviewed.   ED Course  Procedures (including critical care time) Labs Review Labs Reviewed - No data to display  Imaging Review No results found.   EKG Interpretation None      MDM   Final diagnoses:  Otalgia, right  Chronic nonintractable headache, unspecified  headache type    Patient is a 69 year old Caucasian female with pertinent past medical history of right sided neck and head pain which has been present for the past year who comes to the emergency department today with continued headache and bleeding from her right ear. Physical exam as above. Patient does have a small abrasion to her auditory canal and there appears to be some slight trauma to the right tympanic membrane as result she was provided with a prescription for Ciprodex and treated with this in emergency department. Patient's headache is unchanged over the past year. She has no focal neurologic deficits as a result I doubt a CVA. She's not had any vision changes and the pain has been present for a year as a result I doubt a temporal arteritis. Patient has been evaluated by neurology and is scheduled to see an orthopedic surgeon for her neck and ear nose and throat doctor within the next week. As a result I do not feel that she requires further evaluation of her chronic headache at this time. She is already treated with Norco at  home and was instructed to continue utilizing this for pain. Patient was instructed to return to the emergency department with weakness, numbness, worsening pain, and any other concerns. The patient expressed understanding. She was discharged in good condition. Her care was discussed with my attending Dr. Aline Brochure.     Katheren Shams, MD 10/16/14 1223  Pamella Pert, MD 10/16/14 [phone]

## 2014-10-15 NOTE — Discharge Instructions (Signed)
General Headache Without Cause  A general headache is pain or discomfort felt around the head or neck area. The cause may not be found.   HOME CARE   · Keep all doctor visits.  · Only take medicines as told by your doctor.  · Lie down in a dark, quiet room when you have a headache.  · Keep a journal to find out if certain things bring on headaches. For example, write down:  ¨ What you eat and drink.  ¨ How much sleep you get.  ¨ Any change to your diet or medicines.  · Relax by getting a massage or doing other relaxing activities.  · Put ice or heat packs on the head and neck area as told by your doctor.  · Lessen stress.  · Sit up straight. Do not tighten (tense) your muscles.  · Quit smoking if you smoke.  · Lessen how much alcohol you drink.  · Lessen how much caffeine you drink, or stop drinking caffeine.  · Eat and sleep on a regular schedule.  · Get 7 to 9 hours of sleep, or as told by your doctor.  · Keep lights dim if bright lights bother you or make your headaches worse.  GET HELP RIGHT AWAY IF:   · Your headache becomes really bad.  · You have a fever.  · You have a stiff neck.  · You have trouble seeing.  · Your muscles are weak, or you lose muscle control.  · You lose your balance or have trouble walking.  · You feel like you will pass out (faint), or you pass out.  · You have really bad symptoms that are different than your first symptoms.  · You have problems with the medicines given to you by your doctor.  · Your medicines do not work.  · Your headache feels different than the other headaches.  · You feel sick to your stomach (nauseous) or throw up (vomit).  MAKE SURE YOU:   · Understand these instructions.  · Will watch your condition.  · Will get help right away if you are not doing well or get worse.  Document Released: 06/08/2008 Document Revised: 11/22/2011 Document Reviewed: 08/20/2011  [employer]® Patient Information ©2015 [employer]. This information is not intended to replace advice given to  you by your health care provider. Make sure you discuss any questions you have with your health care provider.

## 2014-12-04 ENCOUNTER — Other Ambulatory Visit: Payer: Self-pay | Admitting: Oral Surgery

## 2014-12-04 DIAGNOSIS — M26629 Arthralgia of temporomandibular joint, unspecified side: Secondary | ICD-10-CM

## 2014-12-04 DIAGNOSIS — R29898 Other symptoms and signs involving the musculoskeletal system: Secondary | ICD-10-CM

## 2014-12-26 ENCOUNTER — Ambulatory Visit
Admission: RE | Admit: 2014-12-26 | Discharge: 2014-12-26 | Disposition: A | Discharge status: Home / Self Care | Payer: Medicare Other | Source: Ambulatory Visit | Attending: Oral Surgery | Admitting: Oral Surgery

## 2014-12-26 ENCOUNTER — Other Ambulatory Visit: Payer: Medicare Other

## 2014-12-26 DIAGNOSIS — M26629 Arthralgia of temporomandibular joint, unspecified side: Secondary | ICD-10-CM

## 2014-12-26 DIAGNOSIS — R29898 Other symptoms and signs involving the musculoskeletal system: Secondary | ICD-10-CM

## 2015-01-13 ENCOUNTER — Telehealth: Payer: Self-pay | Admitting: *Deleted

## 2015-01-13 NOTE — Telephone Encounter (Signed)
Spoke with patient and she was requesting an appointment with cardiologist because she stopped her toprol xl 100 mg 2 months ago because she felt it was causing her to have a very dry mouth. Patient said that since that time, her symptoms of a dry mouth have improved. Patient advised that she could restart the medication without an appointment. Patient said she did have refills on the medication. Nurse advised patient to start taking half (50mg ) daily and MD would be informed.

## 2015-02-03 ENCOUNTER — Encounter (HOSPITAL_COMMUNITY): Payer: Self-pay | Admitting: *Deleted

## 2015-02-03 NOTE — Progress Notes (Signed)
Michelle Zavala denies having palpations since she has been back on Metoprolol.  Michelle Zavala reported that she takes 50 mg instead of 100 mg once a day.  "I break it into"  I suggested to patient to check with pharmacy to see if it is a pill that should be split.  ( We have that she is on Metoprolol XL)

## 2015-02-03 NOTE — H&P (Signed)
HISTORY AND PHYSICAL  Michelle Zavala is a 69 y.o. female patient with CC: right and left TMJ pain, clicking, popping.  HPI: 6 months h/o bilateral tmj pain.   No diagnosis found.  Past Medical History  Diagnosis Date  . Coronary atherosclerosis of native coronary artery     Mild atherosclerosis 3/10, LVEF 60-65%  . Depression   . GERD (gastroesophageal reflux disease)   . Diverticulosis   . Anxiety   . Chronic lung disease     Fibrosis - Dr. Koleen Nimrod  . Essential hypertension, benign   . PSVT (paroxysmal supraventricular tachycardia)     No current facility-administered medications for this encounter.   Current Outpatient Prescriptions  Medication Sig Dispense Refill  . amitriptyline (ELAVIL) 75 MG tablet Take 75 mg by mouth at bedtime.  6  . Biotin 5000 MCG TABS Take 10,000 mcg by mouth daily.    . budesonide-formoterol (SYMBICORT) 160-4.5 MCG/ACT inhaler Inhale 2 puffs into the lungs 2 (two) times daily. 1 Inhaler 12  . fluticasone (FLONASE) 50 MCG/ACT nasal spray Place 1 spray into the nose daily as needed for allergies.     . hydrochlorothiazide (HYDRODIURIL) 25 MG tablet Take 1 tablet (25 mg total) by mouth daily. 30 tablet 1  . ipratropium-albuterol (DUONEB) 0.5-2.5 (3) MG/3ML SOLN Take 3 mLs by nebulization 2 (two) times daily as needed (shortness of breath).   0  . LORazepam (ATIVAN) 2 MG tablet See admin instructions. Take 2 mg by mouth in the morning, 2 mg at noon, and 4 mg at bedtime    . MAGNESIUM PO Take 2 tablets by mouth daily.    . Melatonin 5 MG TABS Take 5 mg by mouth at bedtime.    . metoprolol succinate (TOPROL-XL) 100 MG 24 hr tablet Take 1 tablet (100 mg total) by mouth daily. 30 tablet 12  . pantoprazole (PROTONIX) 40 MG tablet Take 1 tablet (40 mg total) by mouth 2 (two) times daily. 60 tablet 1  . PREMARIN 0.625 MG tablet Take 1 tablet by mouth daily.    [name] PROAIR HFA 108 (90 BASE) MCG/ACT inhaler Inhale 2 puffs into the lungs every 6 (six) hours as  needed for shortness of breath.     . sertraline (ZOLOFT) 100 MG tablet Take 100 mg by mouth daily.      . Vitamin D, Ergocalciferol, (DRISDOL) 50000 UNITS CAPS capsule Take 50,000 Units by mouth 2 (two) times a week. Take on Wednesday and Saturday.    . cholestyramine (QUESTRAN) 4 GM/DOSE powder Take 0.5 packets (2 g total) by mouth 2 (two) times daily with a meal. (Patient not taking: Reported on 01/31/2015) 378 g 12  . ciprofloxacin-dexamethasone (CIPRODEX) otic suspension Place 4 drops into the right ear 2 (two) times daily. (Patient not taking: Reported on 01/31/2015) 7.5 mL 0  . Multiple Vitamin (MULTIVITAMIN WITH MINERALS) TABS tablet Take 1 tablet by mouth daily. (Patient not taking: Reported on 01/31/2015)    . potassium chloride SA (K-DUR,KLOR-CON) 20 MEQ tablet Take 1 tablet (20 mEq total) by mouth 2 (two) times daily. (Patient not taking: Reported on 01/31/2015) 60 tablet 0   Allergies  Allergen Reactions  . Sulfonamide Derivatives     REACTION: hives   Active Problems:   * No active hospital problems. *  Vitals: There were no vitals taken for this visit. Lab results:No results found for this or any previous visit (from the past 6 hour(s)). Radiology Results: No results found. General appearance: alert, cooperative and  morbidly obese Head: Normocephalic, without obvious abnormality, atraumatic Eyes: negative Ears: normal TM's and external ear canals both ears Nose: Nares normal. Septum midline. Mucosa normal. No drainage or sinus tenderness. Throat: lips, mucosa, and tongue normal; teeth and gums normal and bilateral TMJ crepitus. Maximum opening 36mm. Pharynx clear Neck: no adenopathy, supple, symmetrical, trachea midline and thyroid not enlarged, symmetric, no tenderness/mass/nodules Resp: clear to auscultation bilaterally Cardio: regular rate and rhythm, S1, S2 normal, no murmur, click, rub or gallop   MRI: 12/26/2014  Anterior dislocation of the disc to the right  temporomandibular joint. No reduction. Large perforation. Flattening and osteophytes of the right mandibular condyle with limited translation. Disintegration of the disc of the left tmj with erosion of the articular eminence and flattening and osteophyte formation on the mandibular condyle.  Assessment: Bilateral TMJ DJD, disc dislocation with perforation  Plan:Bilateral TMJ arthrotomy, meniscectomy, abdominal fat transfer to TMJ, possible arthroplasty. General anesthesia  Day surgery.   Michelle Zavala 02/03/2015

## 2015-02-04 ENCOUNTER — Encounter (HOSPITAL_COMMUNITY)
Admission: RE | Disposition: A | Discharge status: Home / Self Care | Payer: Self-pay | Source: Ambulatory Visit | Attending: Oral Surgery

## 2015-02-04 ENCOUNTER — Ambulatory Visit (HOSPITAL_COMMUNITY): Payer: Medicare Other | Admitting: Anesthesiology

## 2015-02-04 ENCOUNTER — Encounter (HOSPITAL_COMMUNITY): Payer: Self-pay | Admitting: Anesthesiology

## 2015-02-04 ENCOUNTER — Ambulatory Visit (HOSPITAL_COMMUNITY)
Admission: RE | Admit: 2015-02-04 | Discharge: 2015-02-04 | Disposition: A | Discharge status: Home / Self Care | Payer: Medicare Other | Source: Ambulatory Visit | Attending: Oral Surgery | Admitting: Oral Surgery

## 2015-02-04 DIAGNOSIS — K5730 Diverticulosis of large intestine without perforation or abscess without bleeding: Secondary | ICD-10-CM | POA: Insufficient documentation

## 2015-02-04 DIAGNOSIS — K219 Gastro-esophageal reflux disease without esophagitis: Secondary | ICD-10-CM | POA: Insufficient documentation

## 2015-02-04 DIAGNOSIS — M2669 Other specified disorders of temporomandibular joint: Secondary | ICD-10-CM | POA: Diagnosis present

## 2015-02-04 DIAGNOSIS — F329 Major depressive disorder, single episode, unspecified: Secondary | ICD-10-CM | POA: Diagnosis not present

## 2015-02-04 DIAGNOSIS — F419 Anxiety disorder, unspecified: Secondary | ICD-10-CM | POA: Insufficient documentation

## 2015-02-04 DIAGNOSIS — Z882 Allergy status to sulfonamides status: Secondary | ICD-10-CM | POA: Diagnosis not present

## 2015-02-04 DIAGNOSIS — I10 Essential (primary) hypertension: Secondary | ICD-10-CM | POA: Insufficient documentation

## 2015-02-04 DIAGNOSIS — I2510 Atherosclerotic heart disease of native coronary artery without angina pectoris: Secondary | ICD-10-CM | POA: Diagnosis not present

## 2015-02-04 DIAGNOSIS — Z79899 Other long term (current) drug therapy: Secondary | ICD-10-CM | POA: Insufficient documentation

## 2015-02-04 HISTORY — DX: Unspecified osteoarthritis, unspecified site: M19.90

## 2015-02-04 HISTORY — PX: TMJ ARTHROSCOPY: SHX1067

## 2015-02-04 HISTORY — DX: Unspecified displaced fracture of second cervical vertebra, initial encounter for closed fracture: S12.100A

## 2015-02-04 LAB — CBC
HCT: 36.6 % (ref 36.0–46.0)
Hemoglobin: 12.3 g/dL (ref 12.0–15.0)
MCH: 29.2 pg (ref 26.0–34.0)
MCHC: 33.6 g/dL (ref 30.0–36.0)
MCV: 86.9 fL (ref 78.0–100.0)
Platelets: 210 10*3/uL (ref 150–400)
RBC: 4.21 MIL/uL (ref 3.87–5.11)
RDW: 13.9 % (ref 11.5–15.5)
WBC: 6.9 10*3/uL (ref 4.0–10.5)

## 2015-02-04 LAB — BASIC METABOLIC PANEL
Anion gap: 11 (ref 5–15)
BUN: 16 mg/dL (ref 6–20)
CO2: 24 mmol/L (ref 22–32)
Calcium: 8.9 mg/dL (ref 8.9–10.3)
Chloride: 102 mmol/L (ref 101–111)
Creatinine, Ser: 1.05 mg/dL — ABNORMAL HIGH (ref 0.44–1.00)
GFR calc Af Amer: >60 mL/min (ref >60)
GFR calc non Af Amer: 53 mL/min — ABNORMAL LOW (ref >60)
Glucose, Bld: 109 mg/dL — ABNORMAL HIGH (ref 65–99)
Potassium: 3 mmol/L — ABNORMAL LOW (ref 3.5–5.1)
Sodium: 137 mmol/L (ref 135–145)

## 2015-02-04 SURGERY — ARTHROSCOPY, TMJ
Anesthesia: General | Laterality: Bilateral

## 2015-02-04 MED ORDER — FENTANYL CITRATE (PF) 100 MCG/2ML IJ SOLN
INTRAMUSCULAR | Status: DC | PRN
  Administered 2015-02-04 (×5): 50 ug via INTRAVENOUS

## 2015-02-04 MED ORDER — MIDAZOLAM HCL 5 MG/5ML IJ SOLN
INTRAMUSCULAR | Status: DC | PRN
  Administered 2015-02-04: 1 mg via INTRAVENOUS

## 2015-02-04 MED ORDER — LIDOCAINE-EPINEPHRINE 1 %-1:100000 IJ SOLN
INTRAMUSCULAR | Status: AC
  Filled 2015-02-04: qty 1

## 2015-02-04 MED ORDER — SUCCINYLCHOLINE CHLORIDE 20 MG/ML IJ SOLN
INTRAMUSCULAR | Status: AC
  Filled 2015-02-04: qty 1

## 2015-02-04 MED ORDER — HYDROMORPHONE HCL 1 MG/ML IJ SOLN
0.2500 mg | INTRAMUSCULAR | Status: DC | PRN
  Administered 2015-02-04: 0.5 mg via INTRAVENOUS

## 2015-02-04 MED ORDER — EPHEDRINE SULFATE 50 MG/ML IJ SOLN
INTRAMUSCULAR | Status: DC | PRN
  Administered 2015-02-04: 10 mg via INTRAVENOUS
  Administered 2015-02-04: 5 mg via INTRAVENOUS
  Administered 2015-02-04: 10 mg via INTRAVENOUS
  Administered 2015-02-04 (×2): 2.5 mg via INTRAVENOUS
  Administered 2015-02-04: 5 mg via INTRAVENOUS
  Administered 2015-02-04: 7.5 mg via INTRAVENOUS
  Administered 2015-02-04: 2.5 mg via INTRAVENOUS

## 2015-02-04 MED ORDER — 0.9 % SODIUM CHLORIDE (POUR BTL) OPTIME
TOPICAL | Status: DC | PRN
  Administered 2015-02-04: 1000 mL

## 2015-02-04 MED ORDER — LIDOCAINE-EPINEPHRINE 2 %-1:100000 IJ SOLN
INTRAMUSCULAR | Status: AC
  Filled 2015-02-04: qty 1

## 2015-02-04 MED ORDER — DEXAMETHASONE SODIUM PHOSPHATE 10 MG/ML IJ SOLN
INTRAMUSCULAR | Status: DC | PRN
  Administered 2015-02-04: 10 mg via INTRAVENOUS
  Administered 2015-02-04: 5 mg via INTRAVENOUS

## 2015-02-04 MED ORDER — CEFAZOLIN SODIUM-DEXTROSE 2-3 GM-% IV SOLR
2.0000 g | INTRAVENOUS | Status: AC
  Administered 2015-02-04: 2 g via INTRAVENOUS

## 2015-02-04 MED ORDER — LACTATED RINGERS IV SOLN
INTRAVENOUS | Status: DC | PRN
  Administered 2015-02-04 (×2): via INTRAVENOUS

## 2015-02-04 MED ORDER — BACITRACIN 500 UNIT/GM EX OINT
TOPICAL_OINTMENT | CUTANEOUS | Status: DC | PRN
  Administered 2015-02-04: 1 via TOPICAL

## 2015-02-04 MED ORDER — SUCCINYLCHOLINE CHLORIDE 20 MG/ML IJ SOLN
INTRAMUSCULAR | Status: DC | PRN
  Administered 2015-02-04: 100 mg via INTRAVENOUS

## 2015-02-04 MED ORDER — PROPOFOL 10 MG/ML IV BOLUS
INTRAVENOUS | Status: AC
  Filled 2015-02-04: qty 20

## 2015-02-04 MED ORDER — ONDANSETRON HCL 4 MG PO TABS: 4.0000 mg | ORAL_TABLET | Freq: Three times a day (TID) | ORAL | Status: DC | PRN

## 2015-02-04 MED ORDER — PHENYLEPHRINE 40 MCG/ML (10ML) SYRINGE FOR IV PUSH (FOR BLOOD PRESSURE SUPPORT)
PREFILLED_SYRINGE | INTRAVENOUS | Status: AC
  Filled 2015-02-04: qty 10

## 2015-02-04 MED ORDER — OXYMETAZOLINE HCL 0.05 % NA SOLN
NASAL | Status: AC
  Filled 2015-02-04: qty 15

## 2015-02-04 MED ORDER — ROCURONIUM BROMIDE 100 MG/10ML IV SOLN
INTRAVENOUS | Status: DC | PRN
  Administered 2015-02-04: 40 mg via INTRAVENOUS
  Administered 2015-02-04: 10 mg via INTRAVENOUS

## 2015-02-04 MED ORDER — CEPHALEXIN 500 MG PO CAPS
500.0000 mg | ORAL_CAPSULE | Freq: Four times a day (QID) | ORAL | Status: DC
Start: 2015-02-04 — End: 2015-07-17

## 2015-02-04 MED ORDER — BACITRACIN ZINC 500 UNIT/GM EX OINT
TOPICAL_OINTMENT | CUTANEOUS | Status: AC
  Filled 2015-02-04: qty 28.35

## 2015-02-04 MED ORDER — NEOSTIGMINE METHYLSULFATE 10 MG/10ML IV SOLN
INTRAVENOUS | Status: DC | PRN
  Administered 2015-02-04: 4 mg via INTRAVENOUS

## 2015-02-04 MED ORDER — OXYCODONE-ACETAMINOPHEN 10-325 MG PO TABS: 1.0000 | ORAL_TABLET | ORAL | Status: DC | PRN

## 2015-02-04 MED ORDER — ROCURONIUM BROMIDE 50 MG/5ML IV SOLN
INTRAVENOUS | Status: AC
  Filled 2015-02-04: qty 1

## 2015-02-04 MED ORDER — MIDAZOLAM HCL 2 MG/2ML IJ SOLN
INTRAMUSCULAR | Status: AC
  Filled 2015-02-04: qty 2

## 2015-02-04 MED ORDER — PHENYLEPHRINE HCL 10 MG/ML IJ SOLN
INTRAMUSCULAR | Status: DC | PRN
  Administered 2015-02-04: 80 ug via INTRAVENOUS

## 2015-02-04 MED ORDER — GLYCOPYRROLATE 0.2 MG/ML IJ SOLN
INTRAMUSCULAR | Status: DC | PRN
  Administered 2015-02-04: 0.6 mg via INTRAVENOUS
  Administered 2015-02-04: 0.2 mg via INTRAVENOUS

## 2015-02-04 MED ORDER — LIDOCAINE HCL (CARDIAC) 20 MG/ML IV SOLN
INTRAVENOUS | Status: DC | PRN
  Administered 2015-02-04: 40 mg via INTRAVENOUS
  Administered 2015-02-04: 60 mg via INTRAVENOUS

## 2015-02-04 MED ORDER — HYDROMORPHONE HCL 1 MG/ML IJ SOLN
INTRAMUSCULAR | Status: AC
  Filled 2015-02-04: qty 1

## 2015-02-04 MED ORDER — OXYMETAZOLINE HCL 0.05 % NA SOLN
NASAL | Status: DC | PRN
  Administered 2015-02-04: 1 via NASAL

## 2015-02-04 MED ORDER — NEOSTIGMINE METHYLSULFATE 10 MG/10ML IV SOLN
INTRAVENOUS | Status: AC
  Filled 2015-02-04: qty 1

## 2015-02-04 MED ORDER — GLYCOPYRROLATE 0.2 MG/ML IJ SOLN
INTRAMUSCULAR | Status: AC
  Filled 2015-02-04: qty 3

## 2015-02-04 MED ORDER — PROPOFOL 10 MG/ML IV BOLUS
INTRAVENOUS | Status: DC | PRN
  Administered 2015-02-04: 160 mg via INTRAVENOUS
  Administered 2015-02-04: 40 mg via INTRAVENOUS

## 2015-02-04 MED ORDER — EPHEDRINE SULFATE 50 MG/ML IJ SOLN
INTRAMUSCULAR | Status: AC
  Filled 2015-02-04: qty 1

## 2015-02-04 MED ORDER — LIDOCAINE-EPINEPHRINE 2 %-1:100000 IJ SOLN
INTRAMUSCULAR | Status: DC | PRN
  Administered 2015-02-04: 20 mL via INTRADERMAL

## 2015-02-04 MED ORDER — ONDANSETRON HCL 4 MG/2ML IJ SOLN
INTRAMUSCULAR | Status: DC | PRN
  Administered 2015-02-04: 4 mg via INTRAVENOUS

## 2015-02-04 MED ORDER — LIDOCAINE HCL (CARDIAC) 20 MG/ML IV SOLN
INTRAVENOUS | Status: AC
  Filled 2015-02-04: qty 5

## 2015-02-04 MED ORDER — ONDANSETRON HCL 4 MG/2ML IJ SOLN
INTRAMUSCULAR | Status: AC
  Filled 2015-02-04: qty 2

## 2015-02-04 MED ORDER — FENTANYL CITRATE (PF) 250 MCG/5ML IJ SOLN
INTRAMUSCULAR | Status: AC
  Filled 2015-02-04: qty 5

## 2015-02-04 MED ORDER — ARTIFICIAL TEARS OP OINT
TOPICAL_OINTMENT | OPHTHALMIC | Status: AC
  Filled 2015-02-04: qty 3.5

## 2015-02-04 MED ORDER — LIDOCAINE-EPINEPHRINE 1 %-1:100000 IJ SOLN
INTRAMUSCULAR | Status: DC | PRN
  Administered 2015-02-04: 20 mL

## 2015-02-04 SURGICAL SUPPLY — 57 items
2.0MM TAPERED ROUND ELITE BUR SOFT TOUCH ×2 IMPLANT
APPLICATOR COTTON TIP 6IN STRL (MISCELLANEOUS) ×2 IMPLANT
BANDAGE ELASTIC 3 VELCRO ST LF (GAUZE/BANDAGES/DRESSINGS) ×4 IMPLANT
BLADE 10 SAFETY STRL DISP (BLADE) ×2 IMPLANT
BLADE MINI 60D BLUE (BLADE) ×2 IMPLANT
BLADE SCLERAL 3.0 60 BEV DOWN (BLADE) ×2 IMPLANT
BLADE SURG 15 STRL LF DISP TIS (BLADE) ×1 IMPLANT
BLADE SURG 15 STRL SS (BLADE) ×1
BNDG GAUZE ELAST 4 BULKY (GAUZE/BANDAGES/DRESSINGS) ×4 IMPLANT
BUR DIAMOND CUTTING 4.0MM (BURR) ×2 IMPLANT
CANISTER SUCTION 2500CC (MISCELLANEOUS) ×2 IMPLANT
CLEANER TIP ELECTROSURG 2X2 (MISCELLANEOUS) ×2 IMPLANT
CONT SPEC 4OZ CLIKSEAL STRL BL (MISCELLANEOUS) ×2 IMPLANT
COTTONBALL LRG STERILE PKG (GAUZE/BANDAGES/DRESSINGS) ×2 IMPLANT
COVER SURGICAL LIGHT HANDLE (MISCELLANEOUS) ×2 IMPLANT
DRAPE INCISE IOBAN 66X45 STRL (DRAPES) ×2 IMPLANT
DRSG TELFA 3X8 NADH (GAUZE/BANDAGES/DRESSINGS) ×2 IMPLANT
ELECT COATED BLADE 2.86 ST (ELECTRODE) ×2 IMPLANT
ELECT NEEDLE TIP 2.8 STRL (NEEDLE) ×2 IMPLANT
GAUZE SPONGE 4X4 12PLY STRL (GAUZE/BANDAGES/DRESSINGS) IMPLANT
GAUZE SPONGE 4X4 16PLY XRAY LF (GAUZE/BANDAGES/DRESSINGS) ×6 IMPLANT
GLOVE BIO SURGEON STRL SZ 6.5 (GLOVE) ×4 IMPLANT
GLOVE BIO SURGEON STRL SZ7.5 (GLOVE) ×4 IMPLANT
GLOVE BIOGEL PI IND STRL 6.5 (GLOVE) ×1 IMPLANT
GLOVE BIOGEL PI INDICATOR 6.5 (GLOVE) ×1
GLOVE SURG SS PI 6.5 STRL IVOR (GLOVE) ×2 IMPLANT
GOWN STRL REUS W/ TWL LRG LVL3 (GOWN DISPOSABLE) ×2 IMPLANT
GOWN STRL REUS W/ TWL XL LVL3 (GOWN DISPOSABLE) ×1 IMPLANT
GOWN STRL REUS W/TWL LRG LVL3 (GOWN DISPOSABLE) ×2
GOWN STRL REUS W/TWL XL LVL3 (GOWN DISPOSABLE) ×1
KIT BASIN OR (CUSTOM PROCEDURE TRAY) ×2 IMPLANT
KIT ROOM TURNOVER OR (KITS) ×2 IMPLANT
NEEDLE 22X1 1/2 (OR ONLY) (NEEDLE) ×4 IMPLANT
NEEDLE 27GAX1X1/2 (NEEDLE) ×2 IMPLANT
NEEDLE BLUNT 16X1.5 OR ONLY (NEEDLE) IMPLANT
NS IRRIG 1000ML POUR BTL (IV SOLUTION) ×2 IMPLANT
PAD ARMBOARD 7.5X6 YLW CONV (MISCELLANEOUS) ×4 IMPLANT
PENCIL BUTTON HOLSTER BLD 10FT (ELECTRODE) ×2 IMPLANT
SUT CHROMIC 3 0 PS 2 (SUTURE) ×4 IMPLANT
SUT MERSILENE 4 0 P 3 (SUTURE) ×2 IMPLANT
SUT MNCRL AB 4-0 PS2 18 (SUTURE) ×2 IMPLANT
SUT PROLENE 5 0 C1 (SUTURE) ×2 IMPLANT
SUT PROLENE 5 0 PS 2 (SUTURE) ×2 IMPLANT
SUT SILK 3 0 (SUTURE) ×1
SUT SILK 3-0 18XBRD TIE 12 (SUTURE) ×1 IMPLANT
SUT VIC AB 2-0 FS1 27 (SUTURE) ×4 IMPLANT
SUT VIC AB 3-0 PS2 18 (SUTURE) ×1
SUT VIC AB 3-0 PS2 18XBRD (SUTURE) ×1 IMPLANT
SUT VIC AB 4-0 P-3 18X BRD (SUTURE) ×2 IMPLANT
SUT VIC AB 4-0 P3 18 (SUTURE) ×4
SYR 50ML SLIP (SYRINGE) IMPLANT
SYR BULB 3OZ (MISCELLANEOUS) ×2 IMPLANT
TRAY ENT MC OR (CUSTOM PROCEDURE TRAY) ×2 IMPLANT
TUBE CONNECTING 12X1/4 (SUCTIONS) ×2 IMPLANT
TUBING IRRIGATION (MISCELLANEOUS) IMPLANT
WATER STERILE IRR 1000ML POUR (IV SOLUTION) IMPLANT
YANKAUER SUCT BULB TIP NO VENT (SUCTIONS) ×2 IMPLANT

## 2015-02-04 NOTE — Op Note (Signed)
02/04/2015  10:38 AM  PATIENT:  Michelle Zavala  69 y.o. female  PRE-OPERATIVE DIAGNOSIS:  Bilateral TMJ degenerative joint disease, bilateral TMJ disc dislocation with perforation.  POST-OPERATIVE DIAGNOSIS:  SAME  PROCEDURE:  Procedure(s): BILATERAL TEMPOROMANDIBULAR JOINT (TMJ) ARTHROTOMY,  MENISECTOMY; AUTOLOGOUS  FAT GRAFT HARVEST FROM ABDOMEN TO TMJ  SURGEON:  Surgeon(s): Diona Browner, DDS  ANESTHESIA:   local and general  EBL:  minimal  DRAINS: none   SPECIMEN:  RIGHT AND LEFT TMJ DISC FRAGMENTS  COUNTS:  YES  PLAN OF CARE: Discharge to home after PACU  PATIENT DISPOSITION:  PACU - hemodynamically stable.   PROCEDURE DETAILS: Dictation # [id number]  [name], DMD 02/04/2015 10:38 AM

## 2015-02-04 NOTE — Progress Notes (Signed)
Pt. Reports that she doesn't know her medicines, she states " I just use whatever is laying around".  Pt. Reports that she uses whatever the pharmacy gives her but she is denying that she even uses Symbicort.

## 2015-02-04 NOTE — Anesthesia Preprocedure Evaluation (Addendum)
Anesthesia Evaluation  Patient identified by MRN, date of birth, ID band Patient awake    Reviewed: Allergy & Precautions, H&P , NPO status , Patient's Chart, lab work & pertinent test results, reviewed documented beta blocker date and time   Airway Mallampati: III  TM Distance: >3 FB Neck ROM: Limited  Mouth opening: Limited Mouth Opening  Dental no notable dental hx. (+) Teeth Intact, Dental Advisory Given   Pulmonary neg pulmonary ROS,  breath sounds clear to auscultation  Pulmonary exam normal       Cardiovascular hypertension, Pt. on medications and Pt. on home beta blockers + CAD and + Peripheral Vascular Disease Rhythm:Regular Rate:Normal     Neuro/Psych Anxiety Depression negative neurological ROS     GI/Hepatic Neg liver ROS, GERD-  Medicated and Controlled,  Endo/Other  negative endocrine ROS  Renal/GU negative Renal ROS  negative genitourinary   Musculoskeletal  (+) Arthritis -, Osteoarthritis,    Abdominal   Peds  Hematology negative hematology ROS (+)   Anesthesia Other Findings   Reproductive/Obstetrics negative OB ROS                            Anesthesia Physical Anesthesia Plan  ASA: III  Anesthesia Plan: General   Post-op Pain Management:    Induction: Intravenous  Airway Management Planned: Nasal ETT and Video Laryngoscope Planned  Additional Equipment:   Intra-op Plan:   Post-operative Plan: Extubation in OR  Informed Consent: I have reviewed the patients History and Physical, chart, labs and discussed the procedure including the risks, benefits and alternatives for the proposed anesthesia with the patient or authorized representative who has indicated his/her understanding and acceptance.   Dental advisory given  Plan Discussed with: CRNA  Anesthesia Plan Comments:        Anesthesia Quick Evaluation

## 2015-02-04 NOTE — Progress Notes (Signed)
Report given to emily rn as caregiver 

## 2015-02-04 NOTE — H&P (Signed)
H&P documentation  -History and Physical Reviewed  -Patient has been re-examined  -No change in the plan of care  Michelle Zavala  

## 2015-02-04 NOTE — Anesthesia Procedure Notes (Signed)
Procedure Name: Intubation Date/Time: 02/04/2015 7:39 AM Performed by: Scheryl Darter Pre-anesthesia Checklist: Patient identified, Emergency Drugs available, Suction available, Patient being monitored and Timeout performed Patient Re-evaluated:Patient Re-evaluated prior to inductionOxygen Delivery Method: Circle system utilized Preoxygenation: Pre-oxygenation with 100% oxygen Intubation Type: IV induction Ventilation: Mask ventilation without difficulty Grade View: Grade III Nasal Tubes: Right, Nasal prep performed and Nasal Rae Tube size: 7.0 mm Number of attempts: 1 Airway Equipment and Method: Video-laryngoscopy Placement Confirmation: ETT inserted through vocal cords under direct vision,  positive ETCO2 and breath sounds checked- equal and bilateral Tube secured with: Tape Dental Injury: Teeth and Oropharynx as per pre-operative assessment

## 2015-02-04 NOTE — Anesthesia Postprocedure Evaluation (Signed)
  Anesthesia Post-op Note  Patient: Michelle Zavala  Procedure(s) Performed: Procedure(s): BILATERAL TEMPOROMANDIBULAR JOINT (TMJ) ARTHROSCOPY MENISECTOMY WITH FAT GRAFT FROM ABDOMEN  (Bilateral)  Patient Location: PACU  Anesthesia Type:General  Level of Consciousness: awake and alert   Airway and Oxygen Therapy: Patient Spontanous Breathing  Post-op Pain: moderate  Post-op Assessment: Post-op Vital signs reviewed, Patient's Cardiovascular Status Stable and Respiratory Function Stable  Post-op Vital Signs: Reviewed  Filed Vitals:   02/04/15 1200  BP: 117/67  Pulse: 86  Temp:   Resp: 18    Complications: No apparent anesthesia complications

## 2015-02-04 NOTE — Progress Notes (Signed)
Dilaudid 0.5 mg wasted into sharps container, witnessed by Darrick Meigs, RN Pt dc'd home, no longer in [facility]

## 2015-02-04 NOTE — Transfer of Care (Signed)
Immediate Anesthesia Transfer of Care Note  Patient: Michelle Zavala  Procedure(s) Performed: Procedure(s): BILATERAL TEMPOROMANDIBULAR JOINT (TMJ) ARTHROSCOPY MENISECTOMY WITH FAT GRAFT FROM ABDOMEN  (Bilateral)  Patient Location: PACU  Anesthesia Type:General  Level of Consciousness: awake, alert , oriented and sedated  Airway & Oxygen Therapy: Patient Spontanous Breathing and Patient connected to nasal cannula oxygen  Post-op Assessment: Report given to RN, Post -op Vital signs reviewed and stable and Patient moving all extremities  Post vital signs: Reviewed and stable  Last Vitals:  Filed Vitals:   02/04/15 0613  BP: 138/90  Pulse: 90  Temp: 36.6 C  Resp: 18    Complications: No apparent anesthesia complications

## 2015-02-05 NOTE — Op Note (Signed)
NAME:  AVICE, FUNCHESS NO.:  [id number]  MEDICAL RECORD NO.:  [id number]  LOCATION:  [facility]                         FACILITY:  [facility]  PHYSICIAN:  [name], M.D.  DATE OF BIRTH:  11-23-1945  DATE OF PROCEDURE:  02/04/2015 DATE OF DISCHARGE:  02/04/2015                              OPERATIVE REPORT   PREOPERATIVE DIAGNOSES:  Bilateral TMJ degenerative joint disease. Bilateral TMJ disk dislocation with perforation.  POSTOPERATIVE DIAGNOSES:  Bilateral TMJ degenerative joint disease. Bilateral TMJ disk dislocation with perforation.  PROCEDURE:  Bilateral TMJ arthrotomy, meniscectomy, autologous fat graft harvested from the abdomen to TMJ.  SURGEON:  [name], M.D.  ANESTHESIA:  General nasal intubation, Dr. Ola Spurr attending.  PROCEDURE IN DETAIL:  The patient was taken to the operating room, placed on the table in supine position.  General anesthesia was administered and a nasal endotracheal tube was placed and secured.  The eyes were protected and the table was then turned.  The right and left temporal regions were shaved as was the suprapubic region, then the patient was prepped and draped for abdominal fat graft harvest as well as bilateral TMJ surgery.  A time-out was then performed.  Surgery was performed first by sterile marking pen was used to outline, approximately 7-cm incision along previous suprapubic incision from the patient's past.  Lidocaine 1%, 1:100,000 epinephrine was infiltrated along the line of the incision and in the subcutaneous tissue superior to the proposed line of incision.  Then, a #10 blade was used to make a full-thickness incision through skin and subcutaneous tissue.  The dissection was carried superiorly toward the umbilicus using straight scissors.  Fat was harvested taking care to avoid the abdominal muscles. Approximately, 10 mL of fat was harvested.  Then, the fat was closed with 2-0 Vicryl and the  subcutaneous was closed with 4-0 Vicryl and a subcuticular 4-0 Monocryl suture was placed.  Then, the area was covered for surgery in the TMJ region.  The right side was operated first.  A sterile marking pen was used to demarcate a preauricular incision with a temporal extension curving anteriorly.  Local anesthetic 2% lidocaine, 1:100,000 epinephrine was infiltrated along the line of the incision and into the joint space.  A total of 10 mL was utilized, then a 15 blade was used to make the incision according to the prior outlined with a marking pen.  Dissection was carried down through the skin and subcutaneous tissue.  The temporal region was dissected first using a blunt hemostat.  The dissection was carried down to the superficial layer of deep temporal fascia.  This was used as a plane of dissection throughout the entire length of the incision.  Bleeding vessels were cauterized with Bovie electrocautery.  Dissection was carried anteriorly across the zygomatic arch until the joint capsule was encountered. Percutaneous mandibular clamp was placed at the angle of the mandible, and the mandible was distracted inferiorly.  The joint capsule was entered using sharp dissection with a 15 blade.  A Freer elevator was used to reflect the capsule tissues and the joint was examined, there were discal fragments along the periphery of the disc, but  no central disc as there was gross perforation.  Discal fragments were removed both anteriorly, laterally, and posteriorly using the curved iris scissors, the Beaver blade, and the pituitary rongeurs.  Once this tissue was removed, joint was irrigated and then manipulated, opened and closed. There were no obvious interferences or crepitus sounds.  Fat was then placed that had been harvested from the abdomen into the TMJ on this side and packed medially and superiorly, then the capsule was closed with 4-0 Mersilene.  The incision was closed using 3-0 and  4-0 Vicryl for deep sutures.  Subcutaneous was closed with 3-0 chromic, and the skin was closed with 5-0 Prolene, then the head was turned and similar procedure was performed on the left TMJ, identical incision and identical dissection.  Once the joint encountered, the findings were the same as on the right side.  There was gross fragmentation of the disc with no central disk portion.  The surrounding discal fragments were removed using the pituitary rongeurs, the [facility] blade, and the iris scissors.  There were some bony irregularities upon opening and closing, and these were smoothed using a bone file.  Then, the harvested fat was placed into the joint and the capsule was closed with 4-0 Mersilene, and the deep tissues were closed with 3-0 Vicryl.  Subcutaneous with 3-0 chromic, and the skin was closed with 5-0 Prolene, then bacitracin and Telfa strips were placed on the right and left side.  Fluffs, Kerlix, and Ace bandage were used to wrap the head for pressure, then fluffs and elastic bandage were placed on the abdomen.  The ears were inspected and found to have no bleeding in the external auditory canal or TM perforations.  The patient was then awakened, extubated, taken to recovery room breathing spontaneously in good condition.  ESTIMATED BLOOD LOSS:  Minimum.  COMPLICATIONS:  None.  SPECIMENS:  Right and left TMJ disc fragments.     [name], M.D.     SMJ/MEDQ  D:  02/04/2015  T:  02/05/2015  Job:  [id number]

## 2015-02-11 ENCOUNTER — Encounter (HOSPITAL_COMMUNITY): Payer: Self-pay | Admitting: Oral Surgery

## 2015-02-12 HISTORY — PX: COLONOSCOPY: SHX174

## 2015-02-14 ENCOUNTER — Encounter (HOSPITAL_COMMUNITY): Payer: Self-pay | Admitting: Oral Surgery

## 2015-04-02 ENCOUNTER — Other Ambulatory Visit: Payer: Self-pay | Admitting: Cardiology

## 2015-04-02 DIAGNOSIS — I6523 Occlusion and stenosis of bilateral carotid arteries: Secondary | ICD-10-CM

## 2015-05-21 ENCOUNTER — Ambulatory Visit (INDEPENDENT_AMBULATORY_CARE_PROVIDER_SITE_OTHER): Payer: Medicare Other

## 2015-05-21 DIAGNOSIS — I6523 Occlusion and stenosis of bilateral carotid arteries: Secondary | ICD-10-CM | POA: Diagnosis not present

## 2015-05-23 ENCOUNTER — Telehealth: Payer: Self-pay | Admitting: *Deleted

## 2015-05-23 NOTE — Telephone Encounter (Signed)
Patient informed and copy sent to Dr. Carmie End office.

## 2015-05-23 NOTE — Telephone Encounter (Signed)
-----   Message from Satira Sark, MD sent at 05/22/2015  9:53 AM EDT ----- Reviewed report. Only mild, 1-39% bilateral ICA stenoses. 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 These results should be conveyed to her primary care provider, Dr. Melina Copa. Please call their office and speak to someone so that they are on the lookout for this report. I would recommend that she have a dedicated thyroid ultrasound - depending on results, she may need further workup and/or biopsy.

## 2015-05-23 NOTE — Telephone Encounter (Signed)
Dr. Carmie End and her nurse were both off today but a message with the below information was left with office staff Hassan Rowan) .

## 2015-06-09 ENCOUNTER — Encounter: Payer: Self-pay | Admitting: Internal Medicine

## 2015-06-24 ENCOUNTER — Encounter: Payer: Self-pay | Admitting: Gastroenterology

## 2015-06-24 ENCOUNTER — Ambulatory Visit: Payer: Medicare Other | Admitting: Gastroenterology

## 2015-06-24 ENCOUNTER — Telehealth: Payer: Self-pay | Admitting: Gastroenterology

## 2015-06-24 NOTE — Telephone Encounter (Signed)
PATIENT WAS A NO SHOW AND LETTER SENT  °

## 2015-07-17 ENCOUNTER — Ambulatory Visit (INDEPENDENT_AMBULATORY_CARE_PROVIDER_SITE_OTHER): Payer: Medicare Other | Admitting: Nurse Practitioner

## 2015-07-17 ENCOUNTER — Encounter: Payer: Self-pay | Admitting: Nurse Practitioner

## 2015-07-17 ENCOUNTER — Other Ambulatory Visit: Payer: Self-pay

## 2015-07-17 VITALS — BP 158/96 | HR 68 | Temp 97.4°F | Ht 65.0 in | Wt 212.8 lb

## 2015-07-17 DIAGNOSIS — R197 Diarrhea, unspecified: Secondary | ICD-10-CM | POA: Diagnosis not present

## 2015-07-17 DIAGNOSIS — R1013 Epigastric pain: Secondary | ICD-10-CM

## 2015-07-17 DIAGNOSIS — R109 Unspecified abdominal pain: Secondary | ICD-10-CM | POA: Insufficient documentation

## 2015-07-17 NOTE — Patient Instructions (Signed)
1. We will have you sign a request form so that we can get your last endoscopy report from [facility]. 2. We will schedule your procedure for you. 3. Further recommendations to be based on results of your procedure. 4. Return for follow-up in 3 months.

## 2015-07-17 NOTE — Assessment & Plan Note (Signed)
Patient has diarrhea postprandially up to 1 or 2 times a day. Her bowel movement in the morning tends to be normal and formed. It is possible these are also due to GERD however cannot rule out underlying irritable bowel. An hesitant to start her on an IBS diarrhea medication at this point due to daily regular stools as well. We will reevaluate her diarrhea symptoms after her procedure to further evaluate her GERD as noted above. If she continues to have symptoms we can trial medication such as Bentyl or Viberzi. Return for follow-up in 3 months.

## 2015-07-17 NOTE — Assessment & Plan Note (Signed)
Patient with primarily epigastric pain which is been worsening. Has a history of GERD. Symptoms are worse after eating, especially eating fried/fatty foods. Will sometimes wake up in the morning and regurgitate slimy material but has what appears to be food product in it. Occasionally "gets choked" in the back of her throat when eating cornbread and larger pills but denies overt dysphagia symptoms stating food doesn't ever gets stuck. Her symptoms seem to be most likely due to worsening GERD despite PPI. We will have her continue her PPI and proceed with an EGD to further evaluate. We'll also request her EGD report from [facility] which was done several years ago. Return for follow-up in 3 months  Proceed with EGD in teh OR on propofol/MAC with Dr. Gala Romney in near future: the risks, benefits, and alternatives have been discussed with the patient in detail. The patient states understanding and desires to proceed.  She is not on any anticoagulants. She is on Zoloft 100 mg daily, Mirapex week, melatonin, and Ativan. Due to polypharmacy we'll plan for the procedure and the OR on propofol to promote adequate sedation.

## 2015-07-17 NOTE — Progress Notes (Signed)
cc'ed to pcp °

## 2015-07-17 NOTE — Progress Notes (Signed)
Primary Care Physician:  Octavio Graves, DO Primary Gastroenterologist:  Dr. Gala Romney  Chief Complaint  Patient presents with  . Abdominal Pain    HPI:   69 year old female referred by PCP for abdominal pain and GERD. Last seen by PCP 05/29/2015 with abdominal pain states it hurts every time after she eats. Currently on Protonix 40 mg twice daily. Labs included with PCP notes and include CBC, CMP, TSH. All were normal.  Today she states she has a history of GERD She has upper abdominal pain after eating, worse with fried/fatty foods. Also in the morning when she awakens she will cough up a substance described as slimy and "sometimes it looks like there's food from the night before in it too.". Denies sinus issues, but was previously on Flonase so this is questionable. Occasionally "gets choked" when eating cornbread and larger pills. Also has lower abdominal pain when she has a bowel movement. Will typically have a normal bowel movement first thing in the morning. After that she will have a loose bowel movement 15 mins after eating which is associated with lower abdominal cramping pain that resolves after her bowel movement. Denies hematochezia, melena, vomiting, chamges in bowel habits. Has had a cholecystectomy. Denies fever, chills, unintentional weight loss. Denies chest pain, dyspnea, dizziness, lightheadedness, syncope, near syncope. Denies any other upper or lower GI symptoms.  States she had an endoscopy "many years ago" at [facility]. Will attempt to obtain these records.  Past Medical History  Diagnosis Date  . Coronary atherosclerosis of native coronary artery     Mild atherosclerosis 3/10, LVEF 60-65%  . Depression   . GERD (gastroesophageal reflux disease)   . Diverticulosis   . Anxiety   . Chronic lung disease     Fibrosis - Dr. Koleen Nimrod  . Essential hypertension, benign   . PSVT (paroxysmal supraventricular tachycardia) ([facility])   . Shortness of breath dyspnea   . C2  cervical fracture ([facility]) 09/17/13    Traumatic Fracutue witth minimal displacement  . Arthritis     Past Surgical History  Procedure Laterality Date  . Cholecystectomy    . Cesarean section    . Total knee arthroplasty Bilateral   . Anterior release vertebral body w/ posterior fusion    . Abdominal hysterectomy    . Tubal ligation    . Tmj arthroscopy Bilateral 02/04/2015    Procedure: BILATERAL TEMPOROMANDIBULAR JOINT (TMJ) ARTHROSCOPY MENISECTOMY WITH FAT GRAFT FROM ABDOMEN ;  Surgeon: Diona Browner, DDS;  Location: [facility];  Service: Oral Surgery;  Laterality: Bilateral;    Current Outpatient Prescriptions  Medication Sig Dispense Refill  . amitriptyline (ELAVIL) 75 MG tablet Take 75 mg by mouth at bedtime.  6  . budesonide-formoterol (SYMBICORT) 160-4.5 MCG/ACT inhaler Inhale 2 puffs into the lungs 2 (two) times daily. 1 Inhaler 12  . hydrochlorothiazide (HYDRODIURIL) 25 MG tablet Take 1 tablet (25 mg total) by mouth daily. 30 tablet 1  . ipratropium-albuterol (DUONEB) 0.5-2.5 (3) MG/3ML SOLN Take 3 mLs by nebulization 2 (two) times daily as needed (shortness of breath).   0  . LORazepam (ATIVAN) 2 MG tablet See admin instructions. Take 2 mg by mouth in the morning, 2 mg at noon, and 4 mg at bedtime    . Melatonin 5 MG TABS Take 5 mg by mouth at bedtime.    . metoprolol succinate (TOPROL-XL) 100 MG 24 hr tablet Take 1 tablet (100 mg total) by mouth daily. (Patient taking differently: Take 50 mg by mouth daily. )  30 tablet 12  . mirabegron ER (MYRBETRIQ) 25 MG TB24 tablet Take 25 mg by mouth as needed.    . pantoprazole (PROTONIX) 40 MG tablet Take 1 tablet (40 mg total) by mouth 2 (two) times daily. 60 tablet 1  . PROAIR HFA 108 (90 BASE) MCG/ACT inhaler Inhale 2 puffs into the lungs every 6 (six) hours as needed for shortness of breath.     . sertraline (ZOLOFT) 100 MG tablet Take 100 mg by mouth daily.      . Vitamin D, Ergocalciferol, (DRISDOL) 50000 UNITS CAPS capsule Take 50,000 Units  by mouth 2 (two) times a week. Take on Wednesday and Saturday.    . Biotin 5000 MCG TABS Take 10,000 mcg by mouth daily.    . cephALEXin (KEFLEX) 500 MG capsule Take 1 capsule (500 mg total) by mouth 4 (four) times daily. (Patient not taking: Reported on 07/17/2015) 28 capsule 0  . fluticasone (FLONASE) 50 MCG/ACT nasal spray Place 1 spray into the nose daily as needed for allergies.     [name] MAGNESIUM PO Take 1 tablet by mouth daily.     . Multiple Vitamin (MULTIVITAMIN WITH MINERALS) TABS tablet Take 1 tablet by mouth daily. (Patient not taking: Reported on 01/31/2015)    . ondansetron (ZOFRAN) 4 MG tablet Take 1 tablet (4 mg total) by mouth every 8 (eight) hours as needed for nausea or vomiting. (Patient not taking: Reported on 07/17/2015) 12 tablet 0  . oxyCODONE-acetaminophen (PERCOCET) 10-325 MG per tablet Take 1-2 tablets by mouth every 4 (four) hours as needed for pain. (Patient not taking: Reported on 07/17/2015) 40 tablet 0  . PREMARIN 0.625 MG tablet Take 1 tablet by mouth daily.     No current facility-administered medications for this visit.    Allergies as of 07/17/2015 - Review Complete 07/17/2015  Allergen Reaction Noted  . Sulfonamide derivatives      Family History  Problem Relation Age of Onset  . Coronary artery disease Father     Premature  . Coronary artery disease Brother     Premature  . Coronary artery disease Sister     Premature    Social History   Social History  . Marital Status: Widowed    Spouse Name: N/A  . Number of Children: 3  . Years of Education: 7   Occupational History  . Retired    Social History Main Topics  . Smoking status: Never Smoker   . Smokeless tobacco: Never Used  . Alcohol Use: No  . Drug Use: No  . Sexual Activity: Not on file   Other Topics Concern  . Not on file   Social History Narrative    Review of Systems: General: Negative for anorexia, weight loss, fever, chills, fatigue, weakness. Eyes: Negative for vision  changes.  ENT: Negative for hoarseness, difficulty swallowing. CV: Negative for chest pain, angina, palpitations, peripheral edema.  Respiratory: Negative for dyspnea at rest, cough, sputum, wheezing.  GI: See history of present illness. MS: Negative for worsening joint pain. Endo: Negative for unusual weight change.  Heme: Negative for bruising or bleeding. Allergy: Negative for rash or hives.    Physical Exam: BP 158/96 mmHg  Pulse 68  Temp(Src) 97.4 F (36.3 C) (Oral)  Ht 5\' 5"  (1.651 m)  Wt 212 lb 12.8 oz (96.525 kg)  BMI 35.41 kg/m2 General:   Alert and oriented. Pleasant and cooperative. Well-nourished and well-developed.  Head:  Normocephalic and atraumatic. Eyes:  Without icterus, sclera clear and  conjunctiva pink.  Ears:  Normal auditory acuity. Cardiovascular:  S1, S2 present without murmurs appreciated. Extremities without clubbing or edema. Respiratory:  Clear to auscultation bilaterally. No wheezes, rales, or rhonchi. No distress.  Gastrointestinal:  +BS, soft, and non-distended. Minimal epigastric TTP. No HSM noted. No guarding or rebound. No masses appreciated.  Rectal:  Deferred  Skin:  Intact without significant lesions or rashes. Neurologic:  Alert and oriented x4;  grossly normal neurologically. Psych:  Alert and cooperative. Normal mood and affect.    07/17/2015 11:24 AM

## 2015-07-29 NOTE — Patient Instructions (Signed)
Michelle Zavala  07/29/2015     @PREFPERIOPPHARMACY @   Your procedure is scheduled on 08/04/2015.  Report to [facility] at 6:15 A.M.  Call this number if you have problems the morning of surgery:  [phone]   Remember:  Do not eat food or drink liquids after midnight.  Take these medicines the morning of surgery with A SIP OF WATER Symbicort, Duoneb, Ativan, Metoprolol, Myrbetriq, Protonix, Zoloft   Do not wear jewelry, make-up or nail polish.  Do not wear lotions, powders, or perfumes.  You may wear deodorant.  Do not shave 48 hours prior to surgery.  Men may shave face and neck.  Do not bring valuables to the hospital.  [facility] is not responsible for any belongings or valuables.  Contacts, dentures or bridgework may not be worn into surgery.  Leave your suitcase in the car.  After surgery it may be brought to your room.  For patients admitted to the hospital, discharge time will be determined by your treatment team.  Patients discharged the day of surgery will not be allowed to drive home.   Please read over the following fact sheets that you were given. Anesthesia Post-op Instructions     PATIENT INSTRUCTIONS POST-ANESTHESIA  IMMEDIATELY FOLLOWING SURGERY:  Do not drive or operate machinery for the first twenty four hours after surgery.  Do not make any important decisions for twenty four hours after surgery or while taking narcotic pain medications or sedatives.  If you develop intractable nausea and vomiting or a severe headache please notify your doctor immediately.  FOLLOW-UP:  Please make an appointment with your surgeon as instructed. You do not need to follow up with anesthesia unless specifically instructed to do so.  WOUND CARE INSTRUCTIONS (if applicable):  Keep a dry clean dressing on the anesthesia/puncture wound site if there is drainage.  Once the wound has quit draining you may leave it open to air.  Generally you should leave the bandage intact for  twenty four hours unless there is drainage.  If the epidural site drains for more than 36-48 hours please call the anesthesia department.  QUESTIONS?:  Please feel free to call your physician or the hospital operator if you have any questions, and they will be happy to assist you.      Esophagogastroduodenoscopy Esophagogastroduodenoscopy (EGD) is a procedure that is used to examine the lining of the esophagus, stomach, and first part of the small intestine (duodenum). A long, flexible, lighted tube with a camera attached (endoscope) is inserted down the throat to view these organs. This procedure is done to detect problems or abnormalities, such as inflammation, bleeding, ulcers, or growths, in order to treat them. The procedure lasts 5-20 minutes. It is usually an outpatient procedure, but it may need to be performed in a hospital in emergency cases. LET [facility] CARE PROVIDER KNOW ABOUT:  Any allergies you have.  All medicines you are taking, including vitamins, herbs, eye drops, creams, and over-the-counter medicines.  Previous problems you or members of your family have had with the use of anesthetics.  Any blood disorders you have.  Previous surgeries you have had.  Medical conditions you have. RISKS AND COMPLICATIONS Generally, this is a safe procedure. However, problems can occur and include:  Infection.  Bleeding.  Tearing (perforation) of the esophagus, stomach, or duodenum.  Difficulty breathing or not being able to breathe.  Excessive sweating.  Spasms of the larynx.  Slowed heartbeat.  Low blood pressure. BEFORE  THE PROCEDURE  Do not eat or drink anything after midnight on the night before the procedure or as directed by your health care provider.  Do not take your regular medicines before the procedure if your health care provider asks you not to. Ask your health care provider about changing or stopping those medicines.  If you wear dentures, be prepared  to remove them before the procedure.  Arrange for someone to drive you home after the procedure. PROCEDURE  A numbing medicine (local anesthetic) may be sprayed in your throat for comfort and to stop you from gagging or coughing.  You will have an IV tube inserted in a vein in your hand or arm. You will receive medicines and fluids through this tube.  You will be given a medicine to relax you (sedative).  A pain reliever will be given through the IV tube.  A mouth guard may be placed in your mouth to protect your teeth and to keep you from biting on the endoscope.  You will be asked to lie on your left side.  The endoscope will be inserted down your throat and into your esophagus, stomach, and duodenum.  Air will be put through the endoscope to allow your health care provider to clearly view the lining of your esophagus.  The lining of your esophagus, stomach, and duodenum will be examined. During the exam, your health care provider may:  Remove tissue to be examined under a microscope (biopsy) for inflammation, infection, or other medical problems.  Remove growths.  Remove objects (foreign bodies) that are stuck.  Treat any bleeding with medicines or other devices that stop tissues from bleeding (hot cautery, clipping devices).  Widen (dilate) or stretch narrowed areas of your esophagus and stomach.  The endoscope will be withdrawn. AFTER THE PROCEDURE  You will be taken to a recovery area for observation. Your blood pressure, heart rate, breathing rate, and blood oxygen level will be monitored often until the medicines you were given have worn off.  Do not eat or drink anything until the numbing medicine has worn off and your gag reflex has returned. You may choke.  Your health care provider should be able to discuss his or her findings with you. It will take longer to discuss the test results if any biopsies were taken.   This information is not intended to replace advice  given to you by your health care provider. Make sure you discuss any questions you have with your health care provider.   Document Released: 12/31/2004 Document Revised: 09/20/2014 Document Reviewed: 08/02/2012 Elsevier Interactive Patient Education [employer].

## 2015-07-30 ENCOUNTER — Encounter (HOSPITAL_COMMUNITY)
Admission: RE | Admit: 2015-07-30 | Discharge: 2015-07-30 | Disposition: A | Discharge status: Home / Self Care | Payer: Medicare Other | Source: Ambulatory Visit | Attending: Internal Medicine | Admitting: Internal Medicine

## 2015-07-30 ENCOUNTER — Encounter (HOSPITAL_COMMUNITY): Payer: Self-pay

## 2015-07-30 DIAGNOSIS — R109 Unspecified abdominal pain: Secondary | ICD-10-CM | POA: Diagnosis not present

## 2015-07-30 DIAGNOSIS — Z01818 Encounter for other preprocedural examination: Secondary | ICD-10-CM | POA: Insufficient documentation

## 2015-07-30 DIAGNOSIS — R197 Diarrhea, unspecified: Secondary | ICD-10-CM | POA: Insufficient documentation

## 2015-07-30 LAB — CBC
HCT: 39.7 % (ref 36.0–46.0)
Hemoglobin: 13.7 g/dL (ref 12.0–15.0)
MCH: 30.6 pg (ref 26.0–34.0)
MCHC: 34.5 g/dL (ref 30.0–36.0)
MCV: 88.6 fL (ref 78.0–100.0)
Platelets: 268 10*3/uL (ref 150–400)
RBC: 4.48 MIL/uL (ref 3.87–5.11)
RDW: 14 % (ref 11.5–15.5)
WBC: 9.2 10*3/uL (ref 4.0–10.5)

## 2015-07-30 LAB — BASIC METABOLIC PANEL
Anion gap: 10 (ref 5–15)
BUN: 20 mg/dL (ref 6–20)
CO2: 25 mmol/L (ref 22–32)
Calcium: 9.6 mg/dL (ref 8.9–10.3)
Chloride: 104 mmol/L (ref 101–111)
Creatinine, Ser: 0.91 mg/dL (ref 0.44–1.00)
GFR calc Af Amer: >60 mL/min (ref >60)
GFR calc non Af Amer: >60 mL/min (ref >60)
Glucose, Bld: 96 mg/dL (ref 65–99)
Potassium: 3.6 mmol/L (ref 3.5–5.1)
Sodium: 139 mmol/L (ref 135–145)

## 2015-08-04 ENCOUNTER — Ambulatory Visit (HOSPITAL_COMMUNITY)
Admission: RE | Admit: 2015-08-04 | Discharge: 2015-08-04 | Disposition: A | Discharge status: Home / Self Care | Payer: Medicare Other | Source: Ambulatory Visit | Attending: Internal Medicine | Admitting: Internal Medicine

## 2015-08-04 ENCOUNTER — Encounter (HOSPITAL_COMMUNITY)
Admission: RE | Disposition: A | Discharge status: Home / Self Care | Payer: Self-pay | Source: Ambulatory Visit | Attending: Internal Medicine

## 2015-08-04 ENCOUNTER — Encounter (HOSPITAL_COMMUNITY): Payer: Self-pay | Admitting: *Deleted

## 2015-08-04 ENCOUNTER — Ambulatory Visit (HOSPITAL_COMMUNITY): Payer: Medicare Other | Admitting: Anesthesiology

## 2015-08-04 DIAGNOSIS — I10 Essential (primary) hypertension: Secondary | ICD-10-CM | POA: Diagnosis not present

## 2015-08-04 DIAGNOSIS — Z79899 Other long term (current) drug therapy: Secondary | ICD-10-CM | POA: Diagnosis not present

## 2015-08-04 DIAGNOSIS — F329 Major depressive disorder, single episode, unspecified: Secondary | ICD-10-CM | POA: Insufficient documentation

## 2015-08-04 DIAGNOSIS — K219 Gastro-esophageal reflux disease without esophagitis: Secondary | ICD-10-CM | POA: Insufficient documentation

## 2015-08-04 DIAGNOSIS — Z9049 Acquired absence of other specified parts of digestive tract: Secondary | ICD-10-CM | POA: Diagnosis not present

## 2015-08-04 DIAGNOSIS — I2510 Atherosclerotic heart disease of native coronary artery without angina pectoris: Secondary | ICD-10-CM | POA: Diagnosis not present

## 2015-08-04 DIAGNOSIS — I471 Supraventricular tachycardia: Secondary | ICD-10-CM | POA: Diagnosis not present

## 2015-08-04 DIAGNOSIS — R1013 Epigastric pain: Secondary | ICD-10-CM | POA: Diagnosis present

## 2015-08-04 DIAGNOSIS — K319 Disease of stomach and duodenum, unspecified: Secondary | ICD-10-CM | POA: Insufficient documentation

## 2015-08-04 DIAGNOSIS — Z882 Allergy status to sulfonamides status: Secondary | ICD-10-CM | POA: Insufficient documentation

## 2015-08-04 DIAGNOSIS — K3189 Other diseases of stomach and duodenum: Secondary | ICD-10-CM | POA: Diagnosis not present

## 2015-08-04 DIAGNOSIS — Z90710 Acquired absence of both cervix and uterus: Secondary | ICD-10-CM | POA: Insufficient documentation

## 2015-08-04 DIAGNOSIS — F419 Anxiety disorder, unspecified: Secondary | ICD-10-CM | POA: Diagnosis not present

## 2015-08-04 DIAGNOSIS — M1990 Unspecified osteoarthritis, unspecified site: Secondary | ICD-10-CM | POA: Insufficient documentation

## 2015-08-04 DIAGNOSIS — I739 Peripheral vascular disease, unspecified: Secondary | ICD-10-CM | POA: Insufficient documentation

## 2015-08-04 HISTORY — PX: ESOPHAGOGASTRODUODENOSCOPY (EGD) WITH PROPOFOL: SHX5813

## 2015-08-04 HISTORY — PX: BIOPSY: SHX5522

## 2015-08-04 SURGERY — ESOPHAGOGASTRODUODENOSCOPY (EGD) WITH PROPOFOL
Anesthesia: Monitor Anesthesia Care

## 2015-08-04 MED ORDER — ONDANSETRON HCL 4 MG/2ML IJ SOLN
4.0000 mg | Freq: Once | INTRAMUSCULAR | Status: AC
  Administered 2015-08-04: 4 mg via INTRAVENOUS

## 2015-08-04 MED ORDER — MIDAZOLAM HCL 5 MG/5ML IJ SOLN
INTRAMUSCULAR | Status: DC | PRN
  Administered 2015-08-04: 2 mg via INTRAVENOUS

## 2015-08-04 MED ORDER — FENTANYL CITRATE (PF) 100 MCG/2ML IJ SOLN
INTRAMUSCULAR | Status: AC
  Filled 2015-08-04: qty 2

## 2015-08-04 MED ORDER — SUCCINYLCHOLINE CHLORIDE 20 MG/ML IJ SOLN
INTRAMUSCULAR | Status: AC
  Filled 2015-08-04: qty 1

## 2015-08-04 MED ORDER — MIDAZOLAM HCL 2 MG/2ML IJ SOLN
INTRAMUSCULAR | Status: AC
  Filled 2015-08-04: qty 2

## 2015-08-04 MED ORDER — LIDOCAINE VISCOUS 2 % MT SOLN
5.0000 mL | Freq: Two times a day (BID) | OROMUCOSAL | Status: AC
Start: 2015-08-04 — End: 2015-08-04
  Administered 2015-08-04 (×2): 5 mL via OROMUCOSAL
  Filled 2015-08-04: qty 15

## 2015-08-04 MED ORDER — ONDANSETRON HCL 4 MG/2ML IJ SOLN
INTRAMUSCULAR | Status: AC
  Filled 2015-08-04: qty 2

## 2015-08-04 MED ORDER — PROPOFOL 10 MG/ML IV BOLUS
INTRAVENOUS | Status: AC
  Filled 2015-08-04: qty 40

## 2015-08-04 MED ORDER — LACTATED RINGERS IV SOLN
INTRAVENOUS | Status: DC
  Administered 2015-08-04: 1000 mL via INTRAVENOUS

## 2015-08-04 MED ORDER — LIDOCAINE HCL (PF) 1 % IJ SOLN
INTRAMUSCULAR | Status: AC
  Filled 2015-08-04: qty 5

## 2015-08-04 MED ORDER — MIDAZOLAM HCL 2 MG/2ML IJ SOLN
1.0000 mg | INTRAMUSCULAR | Status: DC | PRN
  Administered 2015-08-04: 2 mg via INTRAVENOUS

## 2015-08-04 MED ORDER — FENTANYL CITRATE (PF) 100 MCG/2ML IJ SOLN
25.0000 ug | INTRAMUSCULAR | Status: AC
  Administered 2015-08-04 (×2): 25 ug via INTRAVENOUS

## 2015-08-04 MED ORDER — PROPOFOL 500 MG/50ML IV EMUL
INTRAVENOUS | Status: DC | PRN
  Administered 2015-08-04: 150 ug/kg/min via INTRAVENOUS

## 2015-08-04 MED ORDER — EPHEDRINE SULFATE 50 MG/ML IJ SOLN
INTRAMUSCULAR | Status: AC
  Filled 2015-08-04: qty 1

## 2015-08-04 MED ORDER — SODIUM CHLORIDE 0.9 % IJ SOLN
INTRAMUSCULAR | Status: AC
  Filled 2015-08-04: qty 10

## 2015-08-04 MED ORDER — STERILE WATER FOR IRRIGATION IR SOLN
Status: DC | PRN
  Administered 2015-08-04: 1000 mL

## 2015-08-04 MED ORDER — ONDANSETRON HCL 4 MG/2ML IJ SOLN: 4.0000 mg | Freq: Once | INTRAMUSCULAR | Status: DC | PRN

## 2015-08-04 MED ORDER — LIDOCAINE HCL (CARDIAC) 10 MG/ML IV SOLN
INTRAVENOUS | Status: DC | PRN
  Administered 2015-08-04: 50 mg via INTRAVENOUS

## 2015-08-04 MED ORDER — FENTANYL CITRATE (PF) 100 MCG/2ML IJ SOLN: 25.0000 ug | INTRAMUSCULAR | Status: DC | PRN

## 2015-08-04 SURGICAL SUPPLY — 21 items
BLOCK BITE 60FR ADLT L/F BLUE (MISCELLANEOUS) ×2 IMPLANT
DEVICE CLIP HEMOSTAT 235CM (CLIP) IMPLANT
ELECT REM PT RETURN 9FT ADLT (ELECTROSURGICAL)
ELECTRODE REM PT RTRN 9FT ADLT (ELECTROSURGICAL) IMPLANT
FLOOR PAD 36X40 (MISCELLANEOUS)
FORCEPS BIOP RAD 4 LRG CAP 4 (CUTTING FORCEPS) ×2 IMPLANT
FORMALIN 10 PREFIL 20ML (MISCELLANEOUS) ×2 IMPLANT
KIT ENDO PROCEDURE PEN (KITS) ×2 IMPLANT
MANIFOLD NEPTUNE II (INSTRUMENTS) ×2 IMPLANT
NEEDLE SCLEROTHERAPY 25GX240 (NEEDLE) IMPLANT
PAD FLOOR 36X40 (MISCELLANEOUS) IMPLANT
PROBE APC STR FIRE (PROBE) IMPLANT
PROBE INJECTION GOLD (MISCELLANEOUS)
PROBE INJECTION GOLD 7FR (MISCELLANEOUS) IMPLANT
SNARE ROTATE MED OVAL 20MM (MISCELLANEOUS) IMPLANT
SNARE SHORT THROW 13M SML OVAL (MISCELLANEOUS) IMPLANT
SYR 50ML LL SCALE MARK (SYRINGE) ×2 IMPLANT
SYR INFLATION 60ML (SYRINGE) IMPLANT
TUBING INSUFFLATOR CO2MPACT (TUBING) ×2 IMPLANT
TUBING IRRIGATION ENDOGATOR (MISCELLANEOUS) ×2 IMPLANT
WATER STERILE IRR 1000ML POUR (IV SOLUTION) ×2 IMPLANT

## 2015-08-04 NOTE — Anesthesia Procedure Notes (Signed)
Procedure Name: MAC Date/Time: 08/04/2015 7:29 AM Performed by: Andree Elk, AMY A Pre-anesthesia Checklist: Patient identified, Timeout performed, Emergency Drugs available, Suction available and Patient being monitored Oxygen Delivery Method: Simple face mask

## 2015-08-04 NOTE — Transfer of Care (Signed)
Immediate Anesthesia Transfer of Care Note  Patient: Michelle Zavala  Procedure(s) Performed: Procedure(s) with comments: ESOPHAGOGASTRODUODENOSCOPY (EGD) WITH PROPOFOL (N/A) - 0730 BIOPSY (N/A) - Gastric  Patient Location: PACU  Anesthesia Type:MAC  Level of Consciousness: awake, alert , oriented and patient cooperative  Airway & Oxygen Therapy: Patient Spontanous Breathing and Patient connected to face mask oxygen  Post-op Assessment: Report given to RN and Post -op Vital signs reviewed and stable  Post vital signs: Reviewed and stable  Last Vitals:  Filed Vitals:   08/04/15 0720 08/04/15 0725  BP: 129/84 123/79  Pulse:    Temp:    Resp: 20 12    Complications: No apparent anesthesia complications
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
## 2015-08-04 NOTE — H&P (View-Only) (Signed)
  Primary Care Physician:  BUTLER, CYNTHIA, DO Primary Gastroenterologist:  Dr. Rourk  Chief Complaint  Patient presents with  . Abdominal Pain    HPI:   69-year-old female referred by PCP for abdominal pain and GERD. Last seen by PCP 05/29/2015 with abdominal pain states it hurts every time after she eats. Currently on Protonix 40 mg twice daily. Labs included with PCP notes and include CBC, CMP, TSH. All were normal.  Today she states she has a history of GERD She has upper abdominal pain after eating, worse with fried/fatty foods. Also in the morning when she awakens she will cough up a substance described as slimy and "sometimes it looks like there's food from the night before in it too.". Denies sinus issues, but was previously on Flonase so this is questionable. Occasionally "gets choked" when eating cornbread and larger pills. Also has lower abdominal pain when she has a bowel movement. Will typically have a normal bowel movement first thing in the morning. After that she will have a loose bowel movement 15 mins after eating which is associated with lower abdominal cramping pain that resolves after her bowel movement. Denies hematochezia, melena, vomiting, chamges in bowel habits. Has had a cholecystectomy. Denies fever, chills, unintentional weight loss. Denies chest pain, dyspnea, dizziness, lightheadedness, syncope, near syncope. Denies any other upper or lower GI symptoms.  States she had an endoscopy "many years ago" at [facility]. Will attempt to obtain these records.  Past Medical History  Diagnosis Date  . Coronary atherosclerosis of native coronary artery     Mild atherosclerosis 3/10, LVEF 60-65%  . Depression   . GERD (gastroesophageal reflux disease)   . Diverticulosis   . Anxiety   . Chronic lung disease     Fibrosis - Dr. Henderson  . Essential hypertension, benign   . PSVT (paroxysmal supraventricular tachycardia) ([facility])   . Shortness of breath dyspnea   . C2  cervical fracture ([facility]) 09/17/13    Traumatic Fracutue witth minimal displacement  . Arthritis     Past Surgical History  Procedure Laterality Date  . Cholecystectomy    . Cesarean section    . Total knee arthroplasty Bilateral   . Anterior release vertebral body w/ posterior fusion    . Abdominal hysterectomy    . Tubal ligation    . Tmj arthroscopy Bilateral 02/04/2015    Procedure: BILATERAL TEMPOROMANDIBULAR JOINT (TMJ) ARTHROSCOPY MENISECTOMY WITH FAT GRAFT FROM ABDOMEN ;  Surgeon: Scott Jensen, DDS;  Location: [facility];  Service: Oral Surgery;  Laterality: Bilateral;    Current Outpatient Prescriptions  Medication Sig Dispense Refill  . amitriptyline (ELAVIL) 75 MG tablet Take 75 mg by mouth at bedtime.  6  . budesonide-formoterol (SYMBICORT) 160-4.5 MCG/ACT inhaler Inhale 2 puffs into the lungs 2 (two) times daily. 1 Inhaler 12  . hydrochlorothiazide (HYDRODIURIL) 25 MG tablet Take 1 tablet (25 mg total) by mouth daily. 30 tablet 1  . ipratropium-albuterol (DUONEB) 0.5-2.5 (3) MG/3ML SOLN Take 3 mLs by nebulization 2 (two) times daily as needed (shortness of breath).   0  . LORazepam (ATIVAN) 2 MG tablet See admin instructions. Take 2 mg by mouth in the morning, 2 mg at noon, and 4 mg at bedtime    . Melatonin 5 MG TABS Take 5 mg by mouth at bedtime.    . metoprolol succinate (TOPROL-XL) 100 MG 24 hr tablet Take 1 tablet (100 mg total) by mouth daily. (Patient taking differently: Take 50 mg by mouth daily. )   30 tablet 12  . mirabegron ER (MYRBETRIQ) 25 MG TB24 tablet Take 25 mg by mouth as needed.    . pantoprazole (PROTONIX) 40 MG tablet Take 1 tablet (40 mg total) by mouth 2 (two) times daily. 60 tablet 1  . PROAIR HFA 108 (90 BASE) MCG/ACT inhaler Inhale 2 puffs into the lungs every 6 (six) hours as needed for shortness of breath.     . sertraline (ZOLOFT) 100 MG tablet Take 100 mg by mouth daily.      . Vitamin D, Ergocalciferol, (DRISDOL) 50000 UNITS CAPS capsule Take 50,000 Units  by mouth 2 (two) times a week. Take on Wednesday and Saturday.    . Biotin 5000 MCG TABS Take 10,000 mcg by mouth daily.    . cephALEXin (KEFLEX) 500 MG capsule Take 1 capsule (500 mg total) by mouth 4 (four) times daily. (Patient not taking: Reported on 07/17/2015) 28 capsule 0  . fluticasone (FLONASE) 50 MCG/ACT nasal spray Place 1 spray into the nose daily as needed for allergies.     . MAGNESIUM PO Take 1 tablet by mouth daily.     . Multiple Vitamin (MULTIVITAMIN WITH MINERALS) TABS tablet Take 1 tablet by mouth daily. (Patient not taking: Reported on 01/31/2015)    . ondansetron (ZOFRAN) 4 MG tablet Take 1 tablet (4 mg total) by mouth every 8 (eight) hours as needed for nausea or vomiting. (Patient not taking: Reported on 07/17/2015) 12 tablet 0  . oxyCODONE-acetaminophen (PERCOCET) 10-325 MG per tablet Take 1-2 tablets by mouth every 4 (four) hours as needed for pain. (Patient not taking: Reported on 07/17/2015) 40 tablet 0  . PREMARIN 0.625 MG tablet Take 1 tablet by mouth daily.     No current facility-administered medications for this visit.    Allergies as of 07/17/2015 - Review Complete 07/17/2015  Allergen Reaction Noted  . Sulfonamide derivatives      Family History  Problem Relation Age of Onset  . Coronary artery disease Father     Premature  . Coronary artery disease Brother     Premature  . Coronary artery disease Sister     Premature    Social History   Social History  . Marital Status: Widowed    Spouse Name: N/A  . Number of Children: 3  . Years of Education: 7   Occupational History  . Retired    Social History Main Topics  . Smoking status: Never Smoker   . Smokeless tobacco: Never Used  . Alcohol Use: No  . Drug Use: No  . Sexual Activity: Not on file   Other Topics Concern  . Not on file   Social History Narrative    Review of Systems: General: Negative for anorexia, weight loss, fever, chills, fatigue, weakness. Eyes: Negative for vision  changes.  ENT: Negative for hoarseness, difficulty swallowing. CV: Negative for chest pain, angina, palpitations, peripheral edema.  Respiratory: Negative for dyspnea at rest, cough, sputum, wheezing.  GI: See history of present illness. MS: Negative for worsening joint pain. Endo: Negative for unusual weight change.  Heme: Negative for bruising or bleeding. Allergy: Negative for rash or hives.    Physical Exam: BP 158/96 mmHg  Pulse 68  Temp(Src) 97.4 F (36.3 C) (Oral)  Ht 5' 5" (1.651 m)  Wt 212 lb 12.8 oz (96.525 kg)  BMI 35.41 kg/m2 General:   Alert and oriented. Pleasant and cooperative. Well-nourished and well-developed.  Head:  Normocephalic and atraumatic. Eyes:  Without icterus, sclera clear and   conjunctiva pink.  Ears:  Normal auditory acuity. Cardiovascular:  S1, S2 present without murmurs appreciated. Extremities without clubbing or edema. Respiratory:  Clear to auscultation bilaterally. No wheezes, rales, or rhonchi. No distress.  Gastrointestinal:  +BS, soft, and non-distended. Minimal epigastric TTP. No HSM noted. No guarding or rebound. No masses appreciated.  Rectal:  Deferred  Skin:  Intact without significant lesions or rashes. Neurologic:  Alert and oriented x4;  grossly normal neurologically. Psych:  Alert and cooperative. Normal mood and affect.    07/17/2015 11:24 AM  

## 2015-08-04 NOTE — Interval H&P Note (Signed)
History and Physical Interval Note:  08/04/2015 7:16 AM  Michelle Zavala  has presented today for surgery, with the diagnosis of diarrhea, abdominal pain  The various methods of treatment have been discussed with the patient and family. After consideration of risks, benefits and other options for treatment, the patient has consented to  Procedure(s) with comments: ESOPHAGOGASTRODUODENOSCOPY (EGD) WITH PROPOFOL (N/A) - 0730 as a surgical intervention .  The patient's history has been reviewed, patient examined, no change in status, stable for surgery.  I have reviewed the patient's chart and labs.  Questions were answered to the patient's satisfaction.     Michelle Zavala  No change; diagnostic EGD per plan.  The risks, benefits, limitations, alternatives and imponderables have been reviewed with the patient. Potential for esophageal dilation, biopsy, etc. have also been reviewed.  Questions have been answered. All parties agreeable.

## 2015-08-04 NOTE — Op Note (Signed)
[facility] [address] [place], [address]   ENDOSCOPY PROCEDURE REPORT  PATIENT: Michelle, Zavala  MR#: [id number] BIRTHDATE: 31-May-1946 , 46  yrs. old GENDER: female ENDOSCOPIST: R.  Garfield Cornea, MD [facility] REFERRED BY:  Octavio Graves PROCEDURE DATE:  08-30-2015 PROCEDURE:  EGD w/ biopsy INDICATIONS:  Epigastric pain. MEDICATIONS: Deep station per Dr.  [facility] ASA CLASS:      Class III  CONSENT: The risks, benefits, limitations, alternatives and imponderables have been discussed.  The potential for biopsy, esophogeal dilation, etc. have also been reviewed.  Questions have been answered.  All parties agreeable.  Please see the history and physical in the medical record for more information.  DESCRIPTION OF PROCEDURE: After the risks benefits and alternatives of the procedure were thoroughly explained, informed consent was obtained.  The    endoscope was introduced through the mouth and advanced to the second portion of the duodenum , limited by Without limitations. The instrument was slowly withdrawn as the mucosa was fully examined. Estimated blood loss is zero unless otherwise noted in this procedure report.    Entirely normal-appearing tubular esophagus.  Stomach empty. Diffuse gastric erythema and superficial erosions in the antrum. No ulcer or infiltrative process.  No hiatal hernia seen.  Patent pylorus.  Normal-appearing first and second portion of the duodenum.  Biopsies of the abnormal gastric mucosa taken for histologic study.  Retroflexed views revealed as previously described.     The scope was then withdrawn from the patient and the procedure completed.  COMPLICATIONS: There were no immediate complications.  ENDOSCOPIC IMPRESSION: Abnormal gastric mucosa of uncertain significance?"status post gastric biopsy  RECOMMENDATIONS: Follow-up on pathology.  REPEAT EXAM:  eSigned:  R. Garfield Cornea, MD [facility]  2015-08-30 7:50 AM    CC:  CPT CODES: ICD CODES:  The ICD and CPT codes recommended by this software are interpretations from the data that the clinical staff has captured with the software.  The verification of the translation of this report to the ICD and CPT codes and modifiers is the sole responsibility of the health care institution and practicing physician where this report was generated.  [place]. will not be held responsible for the validity of the ICD and CPT codes included on this report.  AMA assumes no liability for data contained or not contained herein. CPT is a Designer, television/film set of the [employer].  PATIENT NAME:  Michelle, Zavala MR#: [id number]

## 2015-08-04 NOTE — Anesthesia Postprocedure Evaluation (Signed)
  Anesthesia Post-op Note  Patient: Michelle Zavala  Procedure(s) Performed: Procedure(s) with comments: ESOPHAGOGASTRODUODENOSCOPY (EGD) WITH PROPOFOL (N/A) - 0730 BIOPSY (N/A) - Gastric  Patient Location: PACU  Anesthesia Type:MAC  Level of Consciousness: awake, alert , oriented and patient cooperative  Airway and Oxygen Therapy: Patient Spontanous Breathing and Patient connected to face mask oxygen  Post-op Pain: none  Post-op Assessment: Post-op Vital signs reviewed, Patient's Cardiovascular Status Stable, Respiratory Function Stable, Patent Airway, No signs of Nausea or vomiting and Pain level controlled              Post-op Vital Signs: Reviewed and stable  Last Vitals:  Filed Vitals:   08/04/15 0720 08/04/15 0725  BP: 129/84 123/79  Pulse:    Temp:    Resp: 20 12    Complications: No apparent anesthesia complications

## 2015-08-04 NOTE — Discharge Instructions (Signed)

## 2015-08-05 ENCOUNTER — Other Ambulatory Visit: Payer: Self-pay | Admitting: Oral Surgery

## 2015-08-05 ENCOUNTER — Encounter (HOSPITAL_COMMUNITY): Payer: Self-pay | Admitting: Internal Medicine

## 2015-08-05 DIAGNOSIS — G8929 Other chronic pain: Secondary | ICD-10-CM

## 2015-08-05 DIAGNOSIS — M26629 Arthralgia of temporomandibular joint, unspecified side: Principal | ICD-10-CM

## 2015-08-06 ENCOUNTER — Ambulatory Visit
Admission: RE | Admit: 2015-08-06 | Discharge: 2015-08-06 | Disposition: A | Discharge status: Home / Self Care | Payer: Medicare Other | Source: Ambulatory Visit | Attending: Oral Surgery | Admitting: Oral Surgery

## 2015-08-06 DIAGNOSIS — M26629 Arthralgia of temporomandibular joint, unspecified side: Principal | ICD-10-CM

## 2015-08-06 DIAGNOSIS — G8929 Other chronic pain: Secondary | ICD-10-CM

## 2015-08-06 MED ORDER — IOPAMIDOL (ISOVUE-300) INJECTION 61%
75.0000 mL | Freq: Once | INTRAVENOUS | Status: AC | PRN
Start: 2015-08-06 — End: 2015-08-06
  Administered 2015-08-06: 75 mL via INTRAVENOUS

## 2015-08-10 ENCOUNTER — Encounter: Payer: Self-pay | Admitting: Internal Medicine

## 2015-08-11 ENCOUNTER — Telehealth: Payer: Self-pay

## 2015-08-11 NOTE — Telephone Encounter (Signed)
Per RMR- Send letter to patient.  Send copy of letter with path to referring provider and PCP.   Offer ov w extender in 3- 4 weeks to-reassess sx

## 2015-08-11 NOTE — Telephone Encounter (Signed)
Letter mailed to the pt. 

## 2015-08-12 ENCOUNTER — Encounter: Payer: Self-pay | Admitting: Internal Medicine

## 2015-08-12 ENCOUNTER — Telehealth: Payer: Self-pay | Admitting: Internal Medicine

## 2015-08-12 NOTE — Telephone Encounter (Signed)
I spoke with the pt, she got sick and was vomiting and having multiple bm's (not diarrhea) on Sunday. She said this is what happens off and on all the time. On Monday she felt a little better and today she feels good. Pt has otc " sea sickness" pills that she takes when she has nausea.  She also said that she has a place on the R side of her abd, close to her navel that will get hard and stick out. She is wondering if it is a hernia. She said this happens on a daily basis and it hurts at times but not all the time.  I went over her results letter with her and she is aware of her appt in January. She wants to know if there is anything else she can do?

## 2015-08-12 NOTE — Telephone Encounter (Signed)
Pt had procedure on 11/21 and is still having problems with going to the bathroom and vomiting. Please advise and call [phone] pt is aware of her follow up visit in January

## 2015-08-12 NOTE — Telephone Encounter (Signed)
MADE EARLIER APPT AND LM ON PATIENT PHONE AND MAILED LETTER

## 2015-08-12 NOTE — Telephone Encounter (Signed)
Pt called back. I told her that the nurse was at lunch ans she would call her back

## 2015-08-12 NOTE — Telephone Encounter (Signed)
Tried to call pt- NA- LMOM 

## 2015-08-12 NOTE — Telephone Encounter (Signed)
Please relate contents of biopsy letter November 27 to the patient. This lady had a ventral hernia on 2015 CT scan. This may have gotten worse or she may have an umbilical hernia. She really needs come to the office. If symptoms become more frequent let us know. If she develops acute abdominal pain and bulge which does not go away, needs to go to the ED.

## 2015-08-13 NOTE — Telephone Encounter (Signed)
Pt is aware. Erline Levine, can you get the pt a sooner appt?

## 2015-08-13 NOTE — Telephone Encounter (Signed)
RESCHEDULED TO SOONER AND PT IS AWARE

## 2015-08-18 ENCOUNTER — Telehealth: Payer: Self-pay | Admitting: Internal Medicine

## 2015-08-18 NOTE — Telephone Encounter (Signed)
Michelle Zavala, please see if pt can come in tomorrow with AS. She has an opening tomorrow afternoon.

## 2015-08-18 NOTE — Telephone Encounter (Signed)
Pt has OV for next Wednesday and called today asking to be seen today, tomorrow or sometime this week. I offered her a Wednesday appointment, but she couldn't come at that time. She is having vomiting and diarrhea. Please advise and call her back at [phone]

## 2015-08-18 NOTE — Telephone Encounter (Signed)
Pt is aware of OV for tomorrow

## 2015-08-19 ENCOUNTER — Encounter: Payer: Self-pay | Admitting: Gastroenterology

## 2015-08-19 ENCOUNTER — Other Ambulatory Visit: Payer: Self-pay

## 2015-08-19 ENCOUNTER — Ambulatory Visit (INDEPENDENT_AMBULATORY_CARE_PROVIDER_SITE_OTHER): Payer: Medicare Other | Admitting: Gastroenterology

## 2015-08-19 VITALS — BP 137/88 | HR 80 | Temp 98.1°F | Ht 65.0 in | Wt 209.0 lb

## 2015-08-19 DIAGNOSIS — R109 Unspecified abdominal pain: Secondary | ICD-10-CM | POA: Diagnosis not present

## 2015-08-19 NOTE — Patient Instructions (Signed)
We have scheduled you for a CT scan.  Please keep a food diary to see what foods trigger loose stool.  Start taking Metamucil daily.   We will see you back in 4 weeks!

## 2015-08-19 NOTE — Progress Notes (Signed)
Referring Provider: Octavio Graves, DO Primary Care Physician:  Octavio Graves, DO  Primary GI: Dr. Gala Romney   Chief Complaint  Patient presents with  . Follow-up    HPI:   Michelle Zavala is a 69 y.o. female presenting today with a history of epigastric pain, s/p EGD with only reactive gastropathy found. Negative H.pylori. History of diarrhea 1-2 times per day postprandially. Weight is stable.   Complains of abdominal pain that is constant. No fever/chills. Saturday had episode of vomiting. Hurts when eating/drinking anything. Nothing relieves. Had loose stool all day yesterday. Took some "blue pills" from over the counter and now resolved. Has BM about once or twice a day but the first bowel movement is solid, then has a subsequent loose stool. No rectal bleeding. Believes she had a colonoscopy by Dr. Britta Mccreedy this year. Sometimes pain is improved after BM. Greasy and fatty foods cause loose stool. Pain is located supraumbilically and to the left and right of umbilicus, almost like a "band" across abdomen    Past Medical History  Diagnosis Date  . Coronary atherosclerosis of native coronary artery     Mild atherosclerosis 3/10, LVEF 60-65%  . Depression   . GERD (gastroesophageal reflux disease)   . Diverticulosis   . Anxiety   . Chronic lung disease     Fibrosis - Dr. Koleen Nimrod  . Essential hypertension, benign   . PSVT (paroxysmal supraventricular tachycardia) ([place])   . Shortness of breath dyspnea   . C2 cervical fracture ([place]) 09/17/13    Traumatic Fracutue witth minimal displacement  . Arthritis     Past Surgical History  Procedure Laterality Date  . Cholecystectomy    . Cesarean section    . Total knee arthroplasty Bilateral   . Anterior release vertebral body w/ posterior fusion    . Abdominal hysterectomy    . Tubal ligation    . Tmj arthroscopy Bilateral 02/04/2015    Procedure: BILATERAL TEMPOROMANDIBULAR JOINT (TMJ) ARTHROSCOPY MENISECTOMY WITH FAT GRAFT FROM  ABDOMEN ;  Surgeon: Diona Browner, DDS;  Location: [facility];  Service: Oral Surgery;  Laterality: Bilateral;  . Esophagogastroduodenoscopy (egd) with propofol N/A 08/04/2015    Dr. Rourk:abnormal gastric mucosa s/p biopsy. Reactive gastropathy. Negative H.pylori  . Esophageal biopsy N/A 08/04/2015    Procedure: BIOPSY;  Surgeon: Daneil Dolin, MD;  Location: [facility];  Service: Endoscopy;  Laterality: N/A;  Gastric    Current Outpatient Prescriptions  Medication Sig Dispense Refill  . acetaminophen (TYLENOL) 650 MG CR tablet Take 1,300 mg by mouth at bedtime.    [name] amitriptyline (ELAVIL) 75 MG tablet Take 75 mg by mouth at bedtime.  6  . Aspirin-Salicylamide-Caffeine (ARTHRITIS STRENGTH BC POWDER PO) Take 1 packet by mouth every morning.    . budesonide-formoterol (SYMBICORT) 160-4.5 MCG/ACT inhaler Inhale 2 puffs into the lungs 2 (two) times daily. 1 Inhaler 12  . ciprofloxacin (CIPRO) 500 MG tablet TAKE ONE TABLET BY MOUTH TWICE DAILY FOR 10 DAYS.  0  . Cyanocobalamin (VITAMIN B-12 PO) Take 1 tablet by mouth daily.    . hydrochlorothiazide (HYDRODIURIL) 25 MG tablet Take 1 tablet (25 mg total) by mouth daily. 30 tablet 1  . ipratropium-albuterol (DUONEB) 0.5-2.5 (3) MG/3ML SOLN Take 3 mLs by nebulization 2 (two) times daily as needed (shortness of breath).   0  . LORazepam (ATIVAN) 2 MG tablet Take 2-4 mg by mouth See admin instructions. Take 2 mg by mouth in the morning, 2 mg at noon, and  4 mg at bedtime    . Melatonin 5 MG TABS Take 5 mg by mouth at bedtime.    . metoprolol succinate (TOPROL-XL) 100 MG 24 hr tablet Take 1 tablet (100 mg total) by mouth daily. 30 tablet 12  . mirabegron ER (MYRBETRIQ) 25 MG TB24 tablet Take 25 mg by mouth as needed.    . Multiple Vitamins-Minerals (HAIR/SKIN/NAILS PO) Take 1 tablet by mouth daily.    . pantoprazole (PROTONIX) 40 MG tablet Take 1 tablet (40 mg total) by mouth 2 (two) times daily. 60 tablet 1  . sertraline (ZOLOFT) 100 MG tablet Take 100 mg by  mouth daily.      . Vitamin D, Ergocalciferol, (DRISDOL) 50000 UNITS CAPS capsule Take 50,000 Units by mouth 2 (two) times a week. Take on Wednesday and Saturday.     No current facility-administered medications for this visit.    Allergies as of 08/19/2015 - Review Complete 08/19/2015  Allergen Reaction Noted  . Sulfonamide derivatives      Family History  Problem Relation Age of Onset  . Coronary artery disease Father     Premature  . Coronary artery disease Brother     Premature  . Coronary artery disease Sister     Premature  . Colon cancer Neg Hx     Social History   Social History  . Marital Status: Widowed    Spouse Name: N/A  . Number of Children: 3  . Years of Education: 7   Occupational History  . Retired    Social History Main Topics  . Smoking status: Never Smoker   . Smokeless tobacco: Never Used  . Alcohol Use: No  . Drug Use: No  . Sexual Activity: Not Asked   Other Topics Concern  . None   Social History Narrative    Review of Systems: As mentioned in HPI   Physical Exam: BP 137/88 mmHg  Pulse 80  Temp(Src) 98.1 F (36.7 C)  Ht 5\' 5"  (1.651 m)  Wt 209 lb (94.802 kg)  BMI 34.78 kg/m2 General:   Alert and oriented. No distress noted. Pleasant and cooperative.  Head:  Normocephalic and atraumatic. Eyes:  Conjuctiva clear without scleral icterus. Mouth:  Oral mucosa pink and moist. Good dentition. No lesions. Abdomen:  +BS, soft, mild TTP supraumbilically and bilateral to level of umbilicus and non-distended. Query ventral hernia Msk:  Symmetrical without gross deformities. Normal posture. Extremities:  Without edema. Neurologic:  Alert and  oriented x4;  grossly normal neurologically. Psych:  Alert and cooperative. Normal mood and affect.

## 2015-08-19 NOTE — Progress Notes (Signed)
cc'ed to pcp °

## 2015-08-19 NOTE — Assessment & Plan Note (Signed)
Abdominal pain located almost band-like across the abdomen at the level of the umbilicus, stated as constant but worsened with eating. No weight loss noted. Query ventral hernia on exam. EGD recently negative. Doubt mesenteric ischemia. However, we can proceed with a CTA that can examine the vasculature and also assess for any hernia or other abnormalities. I feel some of her issues may be dietary related as well. I have asked her to keep a food diary, avoid fatty/greasy foods. Does not appear to have diarrhea-predominant symptoms. Add Metamucil daily, practice dietary modification, proceed with CT, and return in 4 weeks. Obtain outside colonoscopy report from Dr. Britta Mccreedy earlier this year.

## 2015-08-22 ENCOUNTER — Ambulatory Visit (HOSPITAL_COMMUNITY)
Admission: RE | Admit: 2015-08-22 | Discharge: 2015-08-22 | Disposition: A | Discharge status: Home / Self Care | Payer: Medicare Other | Source: Ambulatory Visit | Attending: Gastroenterology | Admitting: Gastroenterology

## 2015-08-22 DIAGNOSIS — M5136 Other intervertebral disc degeneration, lumbar region: Secondary | ICD-10-CM | POA: Diagnosis not present

## 2015-08-22 DIAGNOSIS — K760 Fatty (change of) liver, not elsewhere classified: Secondary | ICD-10-CM | POA: Insufficient documentation

## 2015-08-22 DIAGNOSIS — K439 Ventral hernia without obstruction or gangrene: Secondary | ICD-10-CM | POA: Insufficient documentation

## 2015-08-22 DIAGNOSIS — R109 Unspecified abdominal pain: Secondary | ICD-10-CM

## 2015-08-22 DIAGNOSIS — N261 Atrophy of kidney (terminal): Secondary | ICD-10-CM | POA: Diagnosis not present

## 2015-08-22 DIAGNOSIS — R1084 Generalized abdominal pain: Secondary | ICD-10-CM | POA: Diagnosis not present

## 2015-08-22 DIAGNOSIS — Z9049 Acquired absence of other specified parts of digestive tract: Secondary | ICD-10-CM | POA: Diagnosis not present

## 2015-08-22 MED ORDER — IOHEXOL 350 MG/ML SOLN
100.0000 mL | Freq: Once | INTRAVENOUS | Status: AC | PRN
Start: 2015-08-22 — End: 2015-08-22
  Administered 2015-08-22: 100 mL via INTRAVENOUS

## 2015-08-27 ENCOUNTER — Ambulatory Visit: Payer: Medicare Other | Admitting: Nurse Practitioner

## 2015-08-31 NOTE — Progress Notes (Signed)
Quick Note:  CTA reviewed. 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 Will need to monitor her symptoms, as progression of SMA disease could very well cause symptoms. We need to refer her to Vascular Surgery if she does not already have one because of significant renal artery disease of right kidney. Will need evaluation for medical therapy. Keep close follow-up with [place] as planned. ______

## 2015-09-09 ENCOUNTER — Other Ambulatory Visit: Payer: Self-pay

## 2015-09-09 DIAGNOSIS — I7789 Other specified disorders of arteries and arterioles: Secondary | ICD-10-CM

## 2015-09-10 ENCOUNTER — Telehealth: Payer: Self-pay | Admitting: Internal Medicine

## 2015-09-10 NOTE — Telephone Encounter (Signed)
Patient was calling to confirm OV and was asking about a referral that was to be made to see a kidney doctor. She said that she had been admitted to the hospital for a kidney infection and seen someone then, but hasn't heard anything from the referral yet. Please advise. [place]:[phone]

## 2015-09-10 NOTE — Telephone Encounter (Signed)
Routing to clinical pool for referral

## 2015-09-10 NOTE — Telephone Encounter (Signed)
Callled pt and LMOM regarding her appt.

## 2015-09-16 ENCOUNTER — Ambulatory Visit: Payer: Medicare Other | Admitting: Nurse Practitioner

## 2015-09-18 ENCOUNTER — Ambulatory Visit (INDEPENDENT_AMBULATORY_CARE_PROVIDER_SITE_OTHER): Payer: Medicare Other | Admitting: Neurology

## 2015-09-18 ENCOUNTER — Encounter (INDEPENDENT_AMBULATORY_CARE_PROVIDER_SITE_OTHER): Payer: Self-pay

## 2015-09-18 ENCOUNTER — Encounter: Payer: Self-pay | Admitting: Neurology

## 2015-09-18 VITALS — BP 116/82 | HR 78 | Resp 18 | Ht 65.0 in | Wt 208.0 lb

## 2015-09-18 DIAGNOSIS — R200 Anesthesia of skin: Secondary | ICD-10-CM | POA: Diagnosis not present

## 2015-09-18 DIAGNOSIS — S12100S Unspecified displaced fracture of second cervical vertebra, sequela: Secondary | ICD-10-CM

## 2015-09-18 DIAGNOSIS — R202 Paresthesia of skin: Principal | ICD-10-CM

## 2015-09-18 NOTE — Patient Instructions (Addendum)
I am not sure that we can offer you any additional testing for your lip numbness. We may consider an EMG and nerve conduction velocity test, but rarely does this result in helpful test results and it is not a painless procedure - with low yield.  Nerve damage can take up to one year to heal, which means, there is some hope for improvement.  I am not familiar with nerve grafting in the face for numbness.
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
## 2015-09-19 ENCOUNTER — Encounter: Payer: Self-pay | Admitting: Surgery

## 2015-09-22 ENCOUNTER — Ambulatory Visit: Payer: Medicare Other | Admitting: Gastroenterology

## 2015-09-29 ENCOUNTER — Ambulatory Visit (INDEPENDENT_AMBULATORY_CARE_PROVIDER_SITE_OTHER): Payer: Medicare Other | Admitting: Surgery

## 2015-09-29 ENCOUNTER — Encounter: Payer: Self-pay | Admitting: Surgery

## 2015-09-29 VITALS — BP 122/88 | HR 91 | Temp 98.4°F | Resp 16 | Ht 65.0 in | Wt 209.0 lb

## 2015-09-29 DIAGNOSIS — N390 Urinary tract infection, site not specified: Secondary | ICD-10-CM | POA: Diagnosis not present

## 2015-09-29 DIAGNOSIS — I701 Atherosclerosis of renal artery: Secondary | ICD-10-CM

## 2015-09-29 MED ORDER — NITROFURANTOIN MONOHYD MACRO 100 MG PO CAPS: 100.0000 mg | ORAL_CAPSULE | Freq: Two times a day (BID) | ORAL | Status: DC
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
## 2015-09-29 NOTE — Progress Notes (Signed)
Filed Vitals:   09/29/15 1331 09/29/15 1343  BP: 123/96 122/88  Pulse: 92 91  Temp: 98.4 F (36.9 C)   TempSrc: Oral   Resp: 16   Height: 5\' 5"  (1.651 m)   Weight: 209 lb (94.802 kg)   SpO2: 96%

## 2015-10-08 ENCOUNTER — Ambulatory Visit: Payer: Medicare Other

## 2015-10-08 ENCOUNTER — Ambulatory Visit (INDEPENDENT_AMBULATORY_CARE_PROVIDER_SITE_OTHER): Payer: Medicare Other | Admitting: Cardiology

## 2015-10-08 ENCOUNTER — Encounter: Payer: Self-pay | Admitting: Cardiology

## 2015-10-08 VITALS — BP 118/90 | HR 95 | Ht 65.0 in | Wt 209.0 lb

## 2015-10-08 DIAGNOSIS — I2510 Atherosclerotic heart disease of native coronary artery without angina pectoris: Secondary | ICD-10-CM | POA: Diagnosis not present

## 2015-10-08 DIAGNOSIS — I739 Peripheral vascular disease, unspecified: Secondary | ICD-10-CM

## 2015-10-08 DIAGNOSIS — R42 Dizziness and giddiness: Secondary | ICD-10-CM

## 2015-10-08 DIAGNOSIS — I10 Essential (primary) hypertension: Secondary | ICD-10-CM

## 2015-10-08 DIAGNOSIS — I471 Supraventricular tachycardia: Secondary | ICD-10-CM

## 2015-10-08 DIAGNOSIS — I779 Disorder of arteries and arterioles, unspecified: Secondary | ICD-10-CM

## 2015-10-08 NOTE — Patient Instructions (Addendum)
Your physician recommends that you continue on your current medications as directed. Please refer to the Current Medication list given to you today. Your physician has recommended that you wear a 48 hour holter monitor. Holter monitors are medical devices that record the heart's electrical activity. Doctors most often use these monitors to diagnose arrhythmias. Arrhythmias are problems with the speed or rhythm of the heartbeat. The monitor is a small, portable device. You can wear one while you do your normal daily activities. This is usually used to diagnose what is causing palpitations/syncope (passing out). Your physician recommends that you schedule a follow-up appointment in: 1 year. You will receive a reminder letter in the mail in about 10 months reminding you to call and schedule your appointment. If you don't receive this letter, please contact our office. Please follow up with your family doctor regarding your thyroid.

## 2015-10-08 NOTE — Progress Notes (Signed)
Cardiology Office Note  Date: 10/08/2015   ID: Michelle Zavala, DOB 05/15/1946, MRN [id number]  PCP: Octavio Graves, DO  Primary Cardiologist: Rozann Lesches, MD   Chief Complaint  Patient presents with  . Coronary Artery Disease  . PSVT    History of Present Illness: Michelle Zavala is a 70 y.o. female last seen in December 2016. She presents for a routine cardiac follow-up visit. She reports a variety of complaints today ranging from generalized fatigue, atypical pleuritic left lower costal discomfort intermittently of mild degree, occasional sweats, interval UTI, and also generalized dizziness with intermittent lightheadedness on standing.  She does not specifically endorse any exertional chest discomfort or palpitations with the above-mentioned symptoms.  I reviewed her medications which are outlined below.  She continues on high-dose Toprol-XL with prior documented PSVT. She reports compliance with this medication.  Primary care continues with Dr. Melina Copa. Labwork from November 2016 is noted below.  She had carotid Dopplers done in September of last year that demonstrated only mild ICA stenoses. Incidentally noted was an echogenic avascular structure in the left thyroid. This information was conveyed to Dr. Carmie End office and I recommended a thyroid scan at her discretion.  ECG ordered and reviewed by me today shows sinus rhythm with left anterior fascicular block.  Past Medical History  Diagnosis Date  . Coronary atherosclerosis of native coronary artery     Mild atherosclerosis 3/10, LVEF 60-65%  . Depression   . GERD (gastroesophageal reflux disease)   . Diverticulosis   . Anxiety   . Chronic lung disease     Fibrosis - Dr. Koleen Nimrod  . Essential hypertension, benign   . PSVT (paroxysmal supraventricular tachycardia) ([place])   . C2 cervical fracture ([place]) 09/17/13    Traumatic fracture witth minimal displacement  . Arthritis   . COPD (chronic obstructive pulmonary disease)  [facility])     Past Surgical History  Procedure Laterality Date  . Cholecystectomy    . Cesarean section    . Total knee arthroplasty Bilateral   . Anterior release vertebral body w/ posterior fusion    . Abdominal hysterectomy    . Tubal ligation    . Tmj arthroscopy Bilateral 02/04/2015    Procedure: BILATERAL TEMPOROMANDIBULAR JOINT (TMJ) ARTHROSCOPY MENISECTOMY WITH FAT GRAFT FROM ABDOMEN ;  Surgeon: Diona Browner, DDS;  Location: [facility];  Service: Oral Surgery;  Laterality: Bilateral;  . Esophagogastroduodenoscopy (egd) with propofol N/A 08/04/2015    Dr. Rourk:abnormal gastric mucosa s/p biopsy. Reactive gastropathy. Negative H.pylori  . Esophageal biopsy N/A 08/04/2015    Procedure: BIOPSY;  Surgeon: Daneil Dolin, MD;  Location: [facility];  Service: Endoscopy;  Laterality: N/A;  Gastric    Current Outpatient Prescriptions  Medication Sig Dispense Refill  . acetaminophen (TYLENOL) 650 MG CR tablet Take 1,300 mg by mouth at bedtime. Reported on 09/29/2015    . amitriptyline (ELAVIL) 75 MG tablet Take 75 mg by mouth at bedtime.  6  . Aspirin-Salicylamide-Caffeine (ARTHRITIS STRENGTH BC POWDER PO) Take 1 packet by mouth every morning.    . budesonide-formoterol (SYMBICORT) 160-4.5 MCG/ACT inhaler Inhale 2 puffs into the lungs 2 (two) times daily. 1 Inhaler 12  . Cyanocobalamin (VITAMIN B-12 PO) Take 1 tablet by mouth daily.    . fluticasone (FLONASE) 50 MCG/ACT nasal spray Place into the nose.    . hydrochlorothiazide (HYDRODIURIL) 25 MG tablet Take 1 tablet (25 mg total) by mouth daily. 30 tablet 1  . HYDROcodone-acetaminophen (NORCO) 10-325 MG tablet Take  0.5 tablets by mouth 2 (two) times daily as needed.  0  . ipratropium-albuterol (DUONEB) 0.5-2.5 (3) MG/3ML SOLN Take 3 mLs by nebulization 2 (two) times daily as needed (shortness of breath).   0  . LORazepam (ATIVAN) 2 MG tablet Take 2-4 mg by mouth See admin instructions. Take 2 mg by mouth in the morning, 2 mg at noon, and 4 mg at  bedtime    . Melatonin 5 MG TABS Take 5 mg by mouth at bedtime.    . metoprolol succinate (TOPROL-XL) 100 MG 24 hr tablet Take 1 tablet (100 mg total) by mouth daily. 30 tablet 12  . mirabegron ER (MYRBETRIQ) 25 MG TB24 tablet Take 25 mg by mouth as needed. Reported on 09/29/2015    . Multiple Vitamins-Minerals (HAIR/SKIN/NAILS PO) Take 1 tablet by mouth daily.    . nitrofurantoin, macrocrystal-monohydrate, (MACROBID) 100 MG capsule Take 1 capsule (100 mg total) by mouth 2 (two) times daily. 20 capsule 1  . pantoprazole (PROTONIX) 40 MG tablet Take 1 tablet (40 mg total) by mouth 2 (two) times daily. 60 tablet 1  . sertraline (ZOLOFT) 100 MG tablet Take 100 mg by mouth daily.      . Vitamin D, Ergocalciferol, (DRISDOL) 50000 UNITS CAPS capsule Take 50,000 Units by mouth 2 (two) times a week. Take on Wednesday and Saturday.     No current facility-administered medications for this visit.   Allergies:  Sulfonamide derivatives   Social History: The patient  reports that she has never smoked. She has never used smokeless tobacco. She reports that she does not drink alcohol or use illicit drugs.   ROS:  Please see the history of present illness. Otherwise, complete review of systems is positive for none.  All other systems are reviewed and negative.   Physical Exam: VS:  BP 118/90 mmHg  Pulse 95  Ht 5\' 5"  (1.651 m)  Wt 209 lb (94.802 kg)  BMI 34.78 kg/m2  SpO2 98%, BMI Body mass index is 34.78 kg/(m^2).  Wt Readings from Last 3 Encounters:  10/08/15 209 lb (94.802 kg)  09/29/15 209 lb (94.802 kg)  09/18/15 208 lb (94.348 kg)    Obese woman, appears comfortable at rest.  HEENT: Conjunctiva and lids normal, oropharynx with moist mucosa.  Neck: Supple, no elevated JVP or carotid bruits.  Lungs: Clear to auscultation, nonlabored.  Cardiac: Regular rate and rhythm, no S3 gallop or rub.  Abdomen: Soft, nontender, bowel sounds present.  Skin: Warm and dry.  Musculoskeletal: No  kyphosis.  Extremities: No pitting edema, distal pulses full.  Neuropsychiatric: Alert and oriented x3, affect grossly normal.  ECG:  I reviewed her tracing from 09/20/2014 which showed normal sinus rhythm with leftward axis and increased voltage as well as poor R wave progression across the precordium.  Recent Labwork: 07/30/2015: BUN 20; Creatinine, Ser 0.91; Hemoglobin 13.7; Platelets 268; Potassium 3.6; Sodium 139   Other Studies Reviewed Today:  Carotid Dopplers 05/21/2015: Heterogeneous plaque, bilaterally. Stable 1-39% bilateral ICA stenosis. Normal subclavian arteries, bilaterally. Patent vertebral arteries with antegrade flow. Incidental finding of echogenic avascular structure in the left thyroid measuring 1.2 cm x 1.8 cm.  Exercise echocardiogram 05/25/2011: Maximum workload of 4.6 METs achieved, no chest pain or diagnostic ST segment changes to indicate ischemia. No inducible wall motion abnormalities to indicate ischemia.  Assessment and Plan:  1. Dizziness and episodic lightheadedness. Not specifically associated with palpitations, however with known history of PSVT, and on fairly high-dose Toprol-XL, we will obtain a 48-hour  Holter monitor to exclude any symptomatic bradycardia or tachyarrhythmias. We will call her with the results.  2. History of PSVT, generally has been well controlled on Toprol-XL 100 mg daily. Plan to continue annual follow-up.  3. Essential hypertension, no changes made to current medical regimen. Keep follow-up with Dr. Melina Copa.  4. History of mild coronary atherosclerosis , no active angina symptoms.  5. Mild carotid atherosclerosis by carotid Dopplers in September 2016. She could have this reimaged in the next 2 years.  6. 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 This information was conveyed to the patient and Dr. Melina Copa back in September 2016 as documented in the chart, my  recommendation was further dedicated scanning of the thyroid for further evaluation. Patient states that this has not yet occurred. We have once again recommended that she follow-up with Dr. Melina Copa to get this further evaluated.  Current medicines were reviewed with the patient today.   Orders Placed This Encounter  Procedures  . Holter monitor - 48 hour  . EKG 12-Lead    Disposition: FU with me in 1 year.   Signed, Satira Sark, MD, [facility] 10/08/2015 10:31 AM    [facility] [place], [place], [place] [address] Phone: [phone]; Fax: [phone]

## 2015-10-10 ENCOUNTER — Other Ambulatory Visit: Payer: Self-pay | Admitting: Cardiology

## 2015-10-14 ENCOUNTER — Telehealth: Payer: Self-pay | Admitting: *Deleted

## 2015-10-14 NOTE — Telephone Encounter (Signed)
Patient informed that monitor only recorded for 24 hours. Patient said that she didn't feel any symptoms of PSVT while wearing the monitor. Patient advised to come to the office tomorrow to have monitor replaced.

## 2015-10-15 ENCOUNTER — Ambulatory Visit: Payer: Medicare Other | Admitting: Gastroenterology

## 2015-10-15 ENCOUNTER — Telehealth: Payer: Self-pay | Admitting: *Deleted

## 2015-10-15 ENCOUNTER — Other Ambulatory Visit: Payer: Self-pay | Admitting: Cardiology

## 2015-10-15 DIAGNOSIS — R42 Dizziness and giddiness: Secondary | ICD-10-CM

## 2015-10-15 DIAGNOSIS — I471 Supraventricular tachycardia: Secondary | ICD-10-CM

## 2015-10-15 NOTE — Telephone Encounter (Signed)
-----   Message from Satira Sark, MD sent at 10/15/2015  8:33 AM EST ----- Reviewed monitor. Only rare PACs and PVCs with a brief 4 beat burst of SVT. No significant pauses. Would continue current medical regimen.

## 2015-10-15 NOTE — Telephone Encounter (Signed)
Patient informed. 

## 2015-10-15 NOTE — Telephone Encounter (Signed)
Spoke with Dr. Domenic Polite regarding monitor recording for 24 hours and he advised that 24 hours was sufficient. 48 hour holter order replaced by 24 hour order and results have been uploaded to the chart. Nurse left a message on vm of patient informing her that she did not need to come to the office today to have monitor replaced.

## 2015-10-17 ENCOUNTER — Ambulatory Visit: Payer: Medicare Other | Admitting: Nurse Practitioner

## 2015-10-31 ENCOUNTER — Encounter: Payer: Self-pay | Admitting: Gastroenterology

## 2015-10-31 ENCOUNTER — Telehealth: Payer: Self-pay | Admitting: Gastroenterology

## 2015-10-31 ENCOUNTER — Ambulatory Visit: Payer: Medicare Other | Admitting: Gastroenterology

## 2015-10-31 NOTE — Telephone Encounter (Signed)
PATIENT WAS A NO SHOW AND LETTER SENT  °

## 2015-11-04 ENCOUNTER — Other Ambulatory Visit: Payer: Self-pay | Admitting: Otolaryngology

## 2015-11-04 DIAGNOSIS — E041 Nontoxic single thyroid nodule: Secondary | ICD-10-CM | POA: Insufficient documentation

## 2015-11-11 ENCOUNTER — Ambulatory Visit
Admission: RE | Admit: 2015-11-11 | Discharge: 2015-11-11 | Disposition: A | Discharge status: Home / Self Care | Payer: Medicare Other | Source: Ambulatory Visit | Attending: Otolaryngology | Admitting: Otolaryngology

## 2015-11-11 DIAGNOSIS — E041 Nontoxic single thyroid nodule: Secondary | ICD-10-CM

## 2015-11-12 ENCOUNTER — Other Ambulatory Visit: Payer: Self-pay

## 2015-11-12 ENCOUNTER — Encounter: Payer: Self-pay | Admitting: Gastroenterology

## 2015-11-12 ENCOUNTER — Ambulatory Visit (INDEPENDENT_AMBULATORY_CARE_PROVIDER_SITE_OTHER): Payer: Medicare Other | Admitting: Gastroenterology

## 2015-11-12 VITALS — BP 145/92 | HR 88 | Temp 97.4°F | Ht 65.0 in | Wt 208.2 lb

## 2015-11-12 DIAGNOSIS — R1013 Epigastric pain: Secondary | ICD-10-CM

## 2015-11-12 DIAGNOSIS — R109 Unspecified abdominal pain: Secondary | ICD-10-CM

## 2015-11-12 NOTE — Patient Instructions (Addendum)
I have referred you to Dr. Arnoldo Morale to see about possibly repairing that hernia.   Take supplemental fiber such as Metamucil or Benefiber daily.   We will see you in 3 months!

## 2015-11-12 NOTE — Assessment & Plan Note (Addendum)
70 year old female with persistent symptoms of upper abdominal pain, correlating to site of small, fat-containing ventral hernia. She does appear to be symptomatic from this, stating at times it will "bulge" and cause worsening pain. CTA is on file, which I had ordered to rule out mesenteric ischemia prior to knowing she had a hernia. This noted origin of SMA up to 50% narrowed but celiac and IMA widely patent. Progression of SMA could cause pain in the future, but I do not feel her symptoms are related to this. She has had no weight loss, and her symptoms are not consistent with ischemic process.   As of note, she also reports intermittent loose stools that are entirely behavior related. She notes this occurs with greasy foods and going out to eat. I discussed avoidance of these foods with her. She is not a candidate for Viberzi, as she does not have consistent diarrhea and at times notes some constipation. Will add fiber daily and strongly recommend behavior and dietary modification.   Refer to Dr. Arnoldo Morale Return in 3 months.  I am requesting once again the colonoscopy reports from Dr. Britta Mccreedy, reportedly last year, for our records.

## 2015-11-12 NOTE — Progress Notes (Signed)
CC'ED TO PCP 

## 2015-11-12 NOTE — Progress Notes (Signed)
Referring Provider: Octavio Graves, DO Primary Care Physician:  Octavio Graves, DO  Primary GI: Dr. Gala Romney   Chief Complaint  Patient presents with  . Follow-up    HPI:   Michelle Zavala is a 70 y.o. female presenting today with a history of epigastric pain, s/p EGD with only reactive gastropathy found. Negative H.pylori. History of diarrhea 1-2 times per day postprandially. 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 Progression of SMA could cause pain. She was referred to Vascular Surgery due to significant renal artery disease of right kidney. No intervention of right renal artery stenosis felt to be needed. Her weight is remaining stable.   Pain once a day. Gets sick about once a week. Sometimes just vomiting, sometimes gagging. Small midline ventral hernia contains fact and located upper abdomen. After eating, she states her hernia sticks out and is "hard". Chronic runny stools. Will have postprandial loose stool after eating out. Keeps having bladder infections. Takes an OTC agent which stops the diarrhea. BMs depends on what she eats. Believes it is grease that is throwing it off. Eats out and has to go the bathroom. Will occasionally have the same problem at home. Sometimes has small stool consistent with Bristol scale #1-2. Depends on what she eats. Was taking Benefiber at night then stopped. Eats yogurt for breakfast in the morning.   Past Medical History  Diagnosis Date  . Coronary atherosclerosis of native coronary artery     Mild atherosclerosis 3/10, LVEF 60-65%  . Depression   . GERD (gastroesophageal reflux disease)   . Diverticulosis   . Anxiety   . Chronic lung disease     Fibrosis - Dr. Koleen Nimrod  . Essential hypertension, benign   . PSVT (paroxysmal supraventricular tachycardia) ([place])   . C2 cervical fracture ([place]) 09/17/13    Traumatic fracture witth minimal displacement  . Arthritis   . COPD  (chronic obstructive pulmonary disease) [facility])     Past Surgical History  Procedure Laterality Date  . Cholecystectomy    . Cesarean section    . Total knee arthroplasty Bilateral   . Anterior release vertebral body w/ posterior fusion    . Abdominal hysterectomy    . Tubal ligation    . Tmj arthroscopy Bilateral 02/04/2015    Procedure: BILATERAL TEMPOROMANDIBULAR JOINT (TMJ) ARTHROSCOPY MENISECTOMY WITH FAT GRAFT FROM ABDOMEN ;  Surgeon: Diona Browner, DDS;  Location: [place];  Service: Oral Surgery;  Laterality: Bilateral;  . Esophagogastroduodenoscopy (egd) with propofol N/A 08/04/2015    Dr. Rourk:abnormal gastric mucosa s/p biopsy. Reactive gastropathy. Negative H.pylori  . Biopsy N/A 08/04/2015    Procedure: BIOPSY;  Surgeon: Daneil Dolin, MD;  Location: [facility];  Service: Endoscopy;  Laterality: N/A;  Gastric    Current Outpatient Prescriptions  Medication Sig Dispense Refill  . acetaminophen (TYLENOL) 650 MG CR tablet Take 1,300 mg by mouth at bedtime. Reported on 09/29/2015    . amitriptyline (ELAVIL) 75 MG tablet Take 75 mg by mouth at bedtime.  6  . Aspirin-Salicylamide-Caffeine (ARTHRITIS STRENGTH BC POWDER PO) Take 1 packet by mouth every morning.    . budesonide-formoterol (SYMBICORT) 160-4.5 MCG/ACT inhaler Inhale 2 puffs into the lungs 2 (two) times daily. 1 Inhaler 12  . Cyanocobalamin (VITAMIN B-12 PO) Take 1 tablet by mouth daily.    . fluticasone (FLONASE) 50 MCG/ACT nasal spray Place into the nose.    [name]  hydrochlorothiazide (HYDRODIURIL) 25 MG tablet Take 1 tablet (25 mg total) by mouth daily. 30 tablet 1  . HYDROcodone-acetaminophen (NORCO) 10-325 MG tablet Take 0.5 tablets by mouth 2 (two) times daily as needed.  0  . ipratropium-albuterol (DUONEB) 0.5-2.5 (3) MG/3ML SOLN Take 3 mLs by nebulization 2 (two) times daily as needed (shortness of breath).   0  . LORazepam (ATIVAN) 2 MG tablet Take 2-4 mg by mouth See admin instructions. Take 2 mg by mouth in the morning, 2  mg at noon, and 4 mg at bedtime    . Melatonin 5 MG TABS Take 5 mg by mouth at bedtime.    . metoprolol succinate (TOPROL-XL) 100 MG 24 hr tablet TAKE ONE TABLET BY MOUTH DAILY. 30 tablet 6  . mirabegron ER (MYRBETRIQ) 25 MG TB24 tablet Take 25 mg by mouth as needed. Reported on 09/29/2015    . Multiple Vitamins-Minerals (HAIR/SKIN/NAILS PO) Take 1 tablet by mouth daily.    . nitrofurantoin, macrocrystal-monohydrate, (MACROBID) 100 MG capsule Take 1 capsule (100 mg total) by mouth 2 (two) times daily. 20 capsule 1  . pantoprazole (PROTONIX) 40 MG tablet Take 1 tablet (40 mg total) by mouth 2 (two) times daily. 60 tablet 1  . sertraline (ZOLOFT) 100 MG tablet Take 100 mg by mouth daily.      . Vitamin D, Ergocalciferol, (DRISDOL) 50000 UNITS CAPS capsule Take 50,000 Units by mouth 2 (two) times a week. Take on Wednesday and Saturday.     No current facility-administered medications for this visit.    Allergies as of 11/12/2015 - Review Complete 11/12/2015  Allergen Reaction Noted  . Sulfonamide derivatives Hives     Family History  Problem Relation Age of Onset  . Coronary artery disease Father     Premature  . Heart disease Father   . Coronary artery disease Brother     Premature  . Coronary artery disease Sister     Premature  . Colon cancer Neg Hx     Social History   Social History  . Marital Status: Widowed    Spouse Name: N/A  . Number of Children: 3  . Years of Education: 7   Occupational History  . Retired    Social History Main Topics  . Smoking status: Never Smoker   . Smokeless tobacco: Never Used  . Alcohol Use: No  . Drug Use: No  . Sexual Activity: Not Asked   Other Topics Concern  . None   Social History Narrative   Occasionally drinks Pepsi     Review of Systems: As mentioned in HPI   Physical Exam: BP 145/92 mmHg  Pulse 88  Temp(Src) 97.4 F (36.3 C)  Ht 5\' 5"  (1.651 m)  Wt 208 lb 3.2 oz (94.439 kg)  BMI 34.65 kg/m2 General:   Alert and  oriented. No distress noted. Pleasant and cooperative.  Head:  Normocephalic and atraumatic. Eyes:  Conjuctiva clear without scleral icterus. Mouth:  Oral mucosa pink and moist. Good dentition. No Abdomen:  +BS, soft, TTP at upper abdomen, site of ventral hernia and non-distended. No rebound or guarding. No HSM or masses noted. Msk:  Symmetrical without gross deformities. Normal posture. Extremities:  Without edema. Neurologic:  Alert and  oriented x4;  grossly normal neurologically. Psych:  Alert and cooperative. Normal mood and affect.

## 2015-11-21 ENCOUNTER — Other Ambulatory Visit: Payer: Self-pay | Admitting: Otolaryngology

## 2015-11-21 DIAGNOSIS — E041 Nontoxic single thyroid nodule: Secondary | ICD-10-CM

## 2015-11-26 ENCOUNTER — Ambulatory Visit
Admission: RE | Admit: 2015-11-26 | Discharge: 2015-11-26 | Disposition: A | Discharge status: Home / Self Care | Payer: Medicare Other | Source: Ambulatory Visit | Attending: Otolaryngology | Admitting: Otolaryngology

## 2015-11-26 ENCOUNTER — Other Ambulatory Visit: Payer: Medicare Other

## 2015-11-26 ENCOUNTER — Other Ambulatory Visit (HOSPITAL_COMMUNITY)
Admission: RE | Admit: 2015-11-26 | Discharge: 2015-11-26 | Disposition: A | Discharge status: Home / Self Care | Payer: Medicare Other | Source: Ambulatory Visit | Attending: General Surgery | Admitting: General Surgery

## 2015-11-26 DIAGNOSIS — E041 Nontoxic single thyroid nodule: Secondary | ICD-10-CM

## 2015-11-26 NOTE — Patient Instructions (Signed)
Michelle Zavala  11/26/2015     @PREFPERIOPPHARMACY @   Your procedure is scheduled on  12/03/2015 .  Report to [facility] at  700  A.M.  Call this number if you have problems the morning of surgery:  [phone]   Remember:  Do not eat food or drink liquids after midnight.  Take these medicines the morning of surgery with A SIP OF WATER  Elavil, hydrocodone, ativan, metoprolol, mirabegron, protonix, zoloft.   Do not wear jewelry, make-up or nail polish.  Do not wear lotions, powders, or perfumes.  You may wear deodorant.  Do not shave 48 hours prior to surgery.  Men may shave face and neck.  Do not bring valuables to the hospital.  [facility] is not responsible for any belongings or valuables.  Contacts, dentures or bridgework may not be worn into surgery.  Leave your suitcase in the car.  After surgery it may be brought to your room.  For patients admitted to the hospital, discharge time will be determined by your treatment team.  Patients discharged the day of surgery will not be allowed to drive home.   Name and phone number of your driver:   family Special instructions:  none  Please read over the following fact sheets that you were given. Coughing and Deep Breathing, Surgical Site Infection Prevention, Anesthesia Post-op Instructions and Care and Recovery After Surgery      Inguinal Hernia, Adult Muscles help keep everything in the body in its proper place. But if a weak spot in the muscles develops, something can poke through. That is called a hernia. When this happens in the lower part of the belly (abdomen), it is called an inguinal hernia. (It takes its name from a part of the body in this region called the inguinal canal.) A weak spot in the wall of muscles lets some fat or part of the small intestine bulge through. An inguinal hernia can develop at any age. Men get them more often than women. CAUSES  In adults, an inguinal hernia develops over  time.  It can be triggered by:  Suddenly straining the muscles of the lower abdomen.  Lifting heavy objects.  Straining to have a bowel movement. Difficult bowel movements (constipation) can lead to this.  Constant coughing. This may be caused by smoking or lung disease.  Being overweight.  Being pregnant.  Working at a job that requires long periods of standing or heavy lifting.  Having had an inguinal hernia before. One type can be an emergency situation. It is called a strangulated inguinal hernia. It develops if part of the small intestine slips through the weak spot and cannot get back into the abdomen. The blood supply can be cut off. If that happens, part of the intestine may die. This situation requires emergency surgery. SYMPTOMS  Often, a small inguinal hernia has no symptoms. It is found when a healthcare provider does a physical exam. Larger hernias usually have symptoms.   In adults, symptoms may include:  A lump in the groin. This is easier to see when the person is standing. It might disappear when lying down.  In men, a lump in the scrotum.  Pain or burning in the groin. This occurs especially when lifting, straining or coughing.  A dull ache or feeling of pressure in the groin.  Signs of a strangulated hernia can include:  A bulge in the groin that becomes very painful  and tender to the touch.  A bulge that turns red or purple.  Fever, nausea and vomiting.  Inability to have a bowel movement or to pass gas. DIAGNOSIS  To decide if you have an inguinal hernia, a healthcare provider will probably do a physical examination.  This will include asking questions about any symptoms you have noticed.  The healthcare provider might feel the groin area and ask you to cough. If an inguinal hernia is felt, the healthcare provider may try to slide it back into the abdomen.  Usually no other tests are needed. TREATMENT  Treatments can vary. The size of the hernia  makes a difference. Options include:  Watchful waiting. This is often suggested if the hernia is small and you have had no symptoms.  No medical procedure will be done unless symptoms develop.  You will need to watch closely for symptoms. If any occur, contact your healthcare provider right away.  Surgery. This is used if the hernia is larger or you have symptoms.  Open surgery. This is usually an outpatient procedure (you will not stay overnight in a hospital). An cut (incision) is made through the skin in the groin. The hernia is put back inside the abdomen. The weak area in the muscles is then repaired by herniorrhaphy or hernioplasty. Herniorrhaphy: in this type of surgery, the weak muscles are sewn back together. Hernioplasty: a patch or mesh is used to close the weak area in the abdominal wall.  Laparoscopy. In this procedure, a surgeon makes small incisions. A thin tube with a tiny video camera (called a laparoscope) is put into the abdomen. The surgeon repairs the hernia with mesh by looking with the video camera and using two long instruments. HOME CARE INSTRUCTIONS   After surgery to repair an inguinal hernia:  You will need to take pain medicine prescribed by your healthcare provider. Follow all directions carefully.  You will need to take care of the wound from the incision.  Your activity will be restricted for awhile. This will probably include no heavy lifting for several weeks. You also should not do anything too active for a few weeks. When you can return to work will depend on the type of job that you have.  During "watchful waiting" periods, you should:  Maintain a healthy weight.  Eat a diet high in fiber (fruits, vegetables and whole grains).  Drink plenty of fluids to avoid constipation. This means drinking enough water and other liquids to keep your urine clear or pale yellow.  Do not lift heavy objects.  Do not stand for long periods of time.  Quit smoking.  This should keep you from developing a frequent cough. SEEK MEDICAL CARE IF:   A bulge develops in your groin area.  You feel pain, a burning sensation or pressure in the groin. This might be worse if you are lifting or straining.  You develop a fever of more than 100.5 F (38.1 C). SEEK IMMEDIATE MEDICAL CARE IF:   Pain in the groin increases suddenly.  A bulge in the groin gets bigger suddenly and does not go down.  For men, there is sudden pain in the scrotum. Or, the size of the scrotum increases.  A bulge in the groin area becomes red or purple and is painful to touch.  You have nausea or vomiting that does not go away.  You feel your heart beating much faster than normal.  You cannot have a bowel movement or pass gas.  You develop a fever of more than 102.0 F (38.9 C).   This information is not intended to replace advice given to you by your health care provider. Make sure you discuss any questions you have with your health care provider.   Document Released: 01/16/2009 Document Revised: 11/22/2011 Document Reviewed: 03/03/2015 Elsevier Interactive Patient Education 2016 Elsevier Inc[employer] Open Hernia Repair, Care After Refer to this sheet in the next few weeks. These instructions provide you with information on caring for yourself after your procedure. Your health care provider may also give you more specific instructions. Your treatment has been planned according to current medical practices, but problems sometimes occur. Call your health care provider if you have any problems or questions after your procedure. WHAT TO EXPECT AFTER THE PROCEDURE After your procedure, it is typical to have the following:  Pain in your abdomen, especially along your incision. You will be given pain medicines to control the pain.  Constipation. You may be given a stool softener to help prevent this. HOME CARE INSTRUCTIONS  Only take over-the-counter or prescription medicines as directed by  your health care provider.  Keep the incision area dry and clean. You may wash the incision area gently with soap and water 48 hours after surgery. Gently blot or dab the incision area dry. Do not take baths, use swimming pools, or use hot tubs for 10 days or until your health care provider approves.  Change bandages (dressings) as directed by your health care provider.  Continue your normal diet as directed by your health care provider. Eat plenty of fruits and vegetables to help prevent constipation.  Drink enough fluids to keep your urine clear or pale yellow. This also helps prevent constipation.  Do not drive until your health care provider says it is okay.  Do not lift anything heavier than 10 lb (4.5 kg) or play contact sports for 4 weeks or until your health care provider approves.  Follow up with your health care provider as directed. Ask your health care provider when to make an appointment to have your stitches (sutures) or staples removed. SEEK MEDICAL CARE IF:  You have increased bleeding coming from the incision site.  You have blood in your stool.  You have increasing pain in the incision area.  You see redness or swelling in the incision area.  You have fluid (pus) coming from the incision.  You have a fever.  You notice a bad smell coming from the incision area or dressing. SEEK IMMEDIATE MEDICAL CARE IF:  You develop a rash.  You have chest pain or shortness of breath.  You feel lightheaded or feel faint.   This information is not intended to replace advice given to you by your health care provider. Make sure you discuss any questions you have with your health care provider.   Document Released: 03/19/2005 Document Revised: 09/20/2014 Document Reviewed: 04/11/2013 Elsevier Interactive Patient Education 2016 [place] A hernia happens when an organ or tissue inside your body pushes out through a weak spot in the belly (abdomen). HOME  CARE  Avoid stretching or overusing (straining) the muscles near the hernia.  Do not lift anything heavier than 10 lb (4.5 kg).  Use the muscles in your leg when you lift something up. Do not use the muscles in your back.  When you cough, try to cough gently.  Eat a diet that has a lot of fiber. Eat lots of fruits and vegetables.  Drink enough fluids to keep  your pee (urine) clear or pale yellow. Try to drink 6-8 glasses of water a day.  Take medicines to make your poop soft (stool softeners) as told by your doctor.  Lose weight, if you are overweight.  Do not use any tobacco products, including cigarettes, chewing tobacco, or electronic cigarettes. If you need help quitting, ask your doctor.  Keep all follow-up visits as told by your doctor. This is important. GET HELP IF:  The skin by the hernia gets puffy (swollen) or red.  The hernia is painful. GET HELP RIGHT AWAY IF:  You have a fever.  You have belly pain that is getting worse.  You feel sick to your stomach (nauseous) or you throw up (vomit).  You cannot push the hernia back in place by gently pressing on it while you are lying down.  The hernia:  Changes in shape or size.  Is stuck outside your belly.  Changes color.  Feels hard or tender.   This information is not intended to replace advice given to you by your health care provider. Make sure you discuss any questions you have with your health care provider.   Document Released: 02/17/2010 Document Revised: 09/20/2014 Document Reviewed: 07/10/2014 Elsevier Interactive Patient Education 2016 Elsevier Inc. PATIENT INSTRUCTIONS POST-ANESTHESIA  IMMEDIATELY FOLLOWING SURGERY:  Do not drive or operate machinery for the first twenty four hours after surgery.  Do not make any important decisions for twenty four hours after surgery or while taking narcotic pain medications or sedatives.  If you develop intractable nausea and vomiting or a severe headache please  notify your doctor immediately.  FOLLOW-UP:  Please make an appointment with your surgeon as instructed. You do not need to follow up with anesthesia unless specifically instructed to do so.  WOUND CARE INSTRUCTIONS (if applicable):  Keep a dry clean dressing on the anesthesia/puncture wound site if there is drainage.  Once the wound has quit draining you may leave it open to air.  Generally you should leave the bandage intact for twenty four hours unless there is drainage.  If the epidural site drains for more than 36-48 hours please call the anesthesia department.  QUESTIONS?:  Please feel free to call your physician or the hospital operator if you have any questions, and they will be happy to assist you.

## 2015-11-26 NOTE — H&P (Signed)
  NTS SOAP Note  Vital Signs:  Vitals as of: Q000111Q: Systolic Q000111Q: Diastolic 87: Heart Rate 95: Temp 96.69F (Temporal): Height 82ft 5in: Weight 121Lbs 0 Ounces: BMI 20.14   BMI : 20.14 kg/m2  Subjective: This 70 year old female presents for of a hernia.  Has been present for the past few months, is getting worse and causing her discomfort.  Review of Symptoms:  Constitutional:unremarkable   Head:unremarkable Eyes:unremarkable   sinus problems Cardiovascular:  unremarkable Respiratory:dyspnea Gastrointestinabdominal pain Genitourinary:unremarkable   joint, neck, and back pain Skin:unremarkable Hematolgic/Lymphatic:unremarkable   Allergic/Immunologic:unremarkable   Past Medical History:  Reviewed  Past Medical History  Surgical History: cholecystectomy, knee arthroplasty, TAH Medical Problems: CAD, GERD, copd, PSVT Allergies: sulfa Medications: baby asa, elavil, symbicort, flonase, HCTZ, duoneb, ativan, toprol, zoloft   Social History:Reviewed  Social History  Preferred Language: English Race:  White Ethnicity: Not Hispanic / Latino Age: 22 year Marital Status:  S Alcohol: no   Smoking Status: Never smoker reviewed on 11/25/2015 Functional Status reviewed on 11/25/2015 ------------------------------------------------ Bathing: Normal Cooking: Normal Dressing: Normal Driving: Normal Eating: Normal Managing Meds: Normal Oral Care: Normal Shopping: Normal Toileting: Normal Transferring: Normal Walking: Normal Cognitive Status reviewed on 11/25/2015 ------------------------------------------------ Attention: Normal Decision Making: Normal Language: Normal Memory: Normal Motor: Normal Perception: Normal Problem Solving: Normal Visual and Spatial: Normal   Family History:Reviewed  Family Health History Family History is Unknown    Objective Information: General:Well appearing, well nourished in no distress. Heart:RRR, no  murmur or gallop.  Normal S1, S2.  No S3, S4.  Lungs:  CTA bilaterally, no wheezes, rhonchi, rales.  Breathing unlabored. Abdomen:Soft, NT/ND, no HSM, no masses.  Incisional hernia noted along medial aspect of a right upper quadrant scar, reducible, tender.  Assessment:Incisional hernia  Diagnoses: 553.21  K43.2 Incisional hernia (Incisional hernia without obstruction or gangrene)  Procedures: [id number] - OFFICE OUTPATIENT NEW 30 MINUTES    Plan:  Scheduled for incisional herniorrhaphy with mesh on 12/03/15.   Patient Education:Alternative treatments to surgery were discussed with patient (and family).  Risks and benefits  of procedure including bleeding, infection, mesh use, and the possibility of recurrence of the hernia were fully explained to the patient (and family) who gave informed consent. Patient/family questions were addressed.  Follow-up:Pending Surgery

## 2015-11-28 ENCOUNTER — Encounter (HOSPITAL_COMMUNITY): Payer: Self-pay

## 2015-11-28 ENCOUNTER — Encounter (HOSPITAL_COMMUNITY)
Admission: RE | Admit: 2015-11-28 | Discharge: 2015-11-28 | Disposition: A | Discharge status: Home / Self Care | Payer: Medicare Other | Source: Ambulatory Visit | Attending: General Surgery | Admitting: General Surgery

## 2015-11-28 DIAGNOSIS — Z01812 Encounter for preprocedural laboratory examination: Secondary | ICD-10-CM | POA: Diagnosis not present

## 2015-11-28 LAB — CBC WITH DIFFERENTIAL/PLATELET
Band Neutrophils: 0 %
Basophils Absolute: 0 10*3/uL (ref 0.0–0.1)
Basophils Relative: 0 %
Eosinophils Absolute: 0.3 10*3/uL (ref 0.0–0.7)
Eosinophils Relative: 2 %
HCT: 39.3 % (ref 36.0–46.0)
Hemoglobin: 13.6 g/dL (ref 12.0–15.0)
Lymphocytes Relative: 40 %
Lymphs Abs: 4 10*3/uL (ref 0.7–4.0)
MCH: 30.6 pg (ref 26.0–34.0)
MCHC: 34.6 g/dL (ref 30.0–36.0)
MCV: 88.3 fL (ref 78.0–100.0)
Monocytes Absolute: 1 10*3/uL (ref 0.1–1.0)
Monocytes Relative: 9 %
Neutro Abs: 5 10*3/uL (ref 1.7–7.7)
Neutrophils Relative %: 51 %
Platelets: 251 10*3/uL (ref 150–400)
RBC: 4.45 MIL/uL (ref 3.87–5.11)
RDW: 13.3 % (ref 11.5–15.5)
WBC: 9.3 10*3/uL (ref 4.0–10.5)

## 2015-11-28 LAB — SURGICAL PCR SCREEN
MRSA, PCR: NEGATIVE
Staphylococcus aureus: POSITIVE — AB

## 2015-11-28 LAB — BASIC METABOLIC PANEL
Anion gap: 9 (ref 5–15)
BUN: 12 mg/dL (ref 6–20)
CO2: 28 mmol/L (ref 22–32)
Calcium: 9.3 mg/dL (ref 8.9–10.3)
Chloride: 101 mmol/L (ref 101–111)
Creatinine, Ser: 0.89 mg/dL (ref 0.44–1.00)
GFR calc Af Amer: >60 mL/min (ref >60)
GFR calc non Af Amer: >60 mL/min (ref >60)
Glucose, Bld: 130 mg/dL — ABNORMAL HIGH (ref 65–99)
Potassium: 3.2 mmol/L — ABNORMAL LOW (ref 3.5–5.1)
Sodium: 138 mmol/L (ref 135–145)

## 2015-11-28 NOTE — Pre-Procedure Instructions (Signed)
Patient given information to sign up for my chart at home. 

## 2015-12-02 MED ORDER — MUPIROCIN 2 % EX OINT
TOPICAL_OINTMENT | CUTANEOUS | Status: AC
  Filled 2015-12-02: qty 22

## 2015-12-03 ENCOUNTER — Ambulatory Visit (HOSPITAL_COMMUNITY): Payer: Medicare Other | Admitting: Anesthesiology

## 2015-12-03 ENCOUNTER — Encounter (HOSPITAL_COMMUNITY)
Admission: RE | Disposition: A | Discharge status: Home / Self Care | Payer: Self-pay | Source: Ambulatory Visit | Attending: General Surgery

## 2015-12-03 ENCOUNTER — Other Ambulatory Visit: Payer: Self-pay | Admitting: Otolaryngology

## 2015-12-03 ENCOUNTER — Ambulatory Visit (HOSPITAL_COMMUNITY)
Admission: RE | Admit: 2015-12-03 | Discharge: 2015-12-03 | Disposition: A | Discharge status: Home / Self Care | Payer: Medicare Other | Source: Ambulatory Visit | Attending: General Surgery | Admitting: General Surgery

## 2015-12-03 DIAGNOSIS — Z79899 Other long term (current) drug therapy: Secondary | ICD-10-CM | POA: Diagnosis not present

## 2015-12-03 DIAGNOSIS — Z96659 Presence of unspecified artificial knee joint: Secondary | ICD-10-CM | POA: Diagnosis not present

## 2015-12-03 DIAGNOSIS — Z7951 Long term (current) use of inhaled steroids: Secondary | ICD-10-CM | POA: Diagnosis not present

## 2015-12-03 DIAGNOSIS — Z7982 Long term (current) use of aspirin: Secondary | ICD-10-CM | POA: Insufficient documentation

## 2015-12-03 DIAGNOSIS — E041 Nontoxic single thyroid nodule: Secondary | ICD-10-CM

## 2015-12-03 DIAGNOSIS — J449 Chronic obstructive pulmonary disease, unspecified: Secondary | ICD-10-CM | POA: Diagnosis not present

## 2015-12-03 DIAGNOSIS — K219 Gastro-esophageal reflux disease without esophagitis: Secondary | ICD-10-CM | POA: Diagnosis not present

## 2015-12-03 DIAGNOSIS — I10 Essential (primary) hypertension: Secondary | ICD-10-CM | POA: Insufficient documentation

## 2015-12-03 DIAGNOSIS — K432 Incisional hernia without obstruction or gangrene: Secondary | ICD-10-CM | POA: Diagnosis present

## 2015-12-03 HISTORY — PX: INSERTION OF MESH: SHX5868

## 2015-12-03 HISTORY — PX: INCISIONAL HERNIA REPAIR: SHX193

## 2015-12-03 SURGERY — REPAIR, HERNIA, INCISIONAL
Anesthesia: General

## 2015-12-03 MED ORDER — ONDANSETRON HCL 4 MG/2ML IJ SOLN: 4.0000 mg | Freq: Once | INTRAMUSCULAR | Status: DC | PRN

## 2015-12-03 MED ORDER — PROPOFOL 10 MG/ML IV BOLUS
INTRAVENOUS | Status: DC | PRN
  Administered 2015-12-03: 140 mg via INTRAVENOUS

## 2015-12-03 MED ORDER — ROCURONIUM BROMIDE 100 MG/10ML IV SOLN
INTRAVENOUS | Status: DC | PRN
  Administered 2015-12-03: 5 mg via INTRAVENOUS
  Administered 2015-12-03: 25 mg via INTRAVENOUS

## 2015-12-03 MED ORDER — ONDANSETRON HCL 4 MG/2ML IJ SOLN
4.0000 mg | Freq: Once | INTRAMUSCULAR | Status: AC
  Administered 2015-12-03: 4 mg via INTRAVENOUS

## 2015-12-03 MED ORDER — GLYCOPYRROLATE 0.2 MG/ML IJ SOLN
INTRAMUSCULAR | Status: AC
Start: 2015-12-03 — End: 2015-12-03
  Filled 2015-12-03: qty 3

## 2015-12-03 MED ORDER — PROPOFOL 10 MG/ML IV BOLUS
INTRAVENOUS | Status: AC
  Filled 2015-12-03: qty 40

## 2015-12-03 MED ORDER — ONDANSETRON HCL 4 MG/2ML IJ SOLN
INTRAMUSCULAR | Status: AC
  Filled 2015-12-03: qty 2

## 2015-12-03 MED ORDER — DEXAMETHASONE SODIUM PHOSPHATE 4 MG/ML IJ SOLN
4.0000 mg | Freq: Once | INTRAMUSCULAR | Status: AC
  Administered 2015-12-03: 4 mg via INTRAVENOUS

## 2015-12-03 MED ORDER — 0.9 % SODIUM CHLORIDE (POUR BTL) OPTIME
TOPICAL | Status: DC | PRN
  Administered 2015-12-03: 1000 mL

## 2015-12-03 MED ORDER — CHLORHEXIDINE GLUCONATE 4 % EX LIQD
1.0000 | Freq: Once | CUTANEOUS | Status: DC
Start: 2015-12-03 — End: 2015-12-03

## 2015-12-03 MED ORDER — BUPIVACAINE HCL (PF) 0.5 % IJ SOLN
INTRAMUSCULAR | Status: AC
  Filled 2015-12-03: qty 30

## 2015-12-03 MED ORDER — MIDAZOLAM HCL 2 MG/2ML IJ SOLN
INTRAMUSCULAR | Status: AC
  Filled 2015-12-03: qty 2

## 2015-12-03 MED ORDER — FENTANYL CITRATE (PF) 250 MCG/5ML IJ SOLN
INTRAMUSCULAR | Status: AC
  Filled 2015-12-03: qty 5

## 2015-12-03 MED ORDER — LIDOCAINE HCL (PF) 1 % IJ SOLN
INTRAMUSCULAR | Status: AC
  Filled 2015-12-03: qty 5

## 2015-12-03 MED ORDER — FENTANYL CITRATE (PF) 100 MCG/2ML IJ SOLN
INTRAMUSCULAR | Status: DC | PRN
  Administered 2015-12-03 (×2): 50 ug via INTRAVENOUS

## 2015-12-03 MED ORDER — FENTANYL CITRATE (PF) 100 MCG/2ML IJ SOLN: 25.0000 ug | INTRAMUSCULAR | Status: DC | PRN

## 2015-12-03 MED ORDER — LIDOCAINE HCL (CARDIAC) 10 MG/ML IV SOLN
INTRAVENOUS | Status: DC | PRN
  Administered 2015-12-03: 30 mg via INTRAVENOUS

## 2015-12-03 MED ORDER — PHENYLEPHRINE 40 MCG/ML (10ML) SYRINGE FOR IV PUSH (FOR BLOOD PRESSURE SUPPORT)
PREFILLED_SYRINGE | INTRAVENOUS | Status: AC
  Filled 2015-12-03: qty 10

## 2015-12-03 MED ORDER — ROCURONIUM BROMIDE 50 MG/5ML IV SOLN
INTRAVENOUS | Status: AC
  Filled 2015-12-03: qty 1

## 2015-12-03 MED ORDER — KETOROLAC TROMETHAMINE 30 MG/ML IJ SOLN
30.0000 mg | Freq: Once | INTRAMUSCULAR | Status: AC
  Administered 2015-12-03: 30 mg via INTRAVENOUS
  Filled 2015-12-03: qty 1

## 2015-12-03 MED ORDER — SUCCINYLCHOLINE CHLORIDE 20 MG/ML IJ SOLN
INTRAMUSCULAR | Status: DC | PRN
  Administered 2015-12-03: 140 mg via INTRAVENOUS

## 2015-12-03 MED ORDER — BUPIVACAINE LIPOSOME 1.3 % IJ SUSP
INTRAMUSCULAR | Status: AC
  Filled 2015-12-03: qty 20

## 2015-12-03 MED ORDER — GLYCOPYRROLATE 0.2 MG/ML IJ SOLN
INTRAMUSCULAR | Status: DC | PRN
  Administered 2015-12-03: 0.6 mg via INTRAVENOUS

## 2015-12-03 MED ORDER — NEOSTIGMINE METHYLSULFATE 10 MG/10ML IV SOLN
INTRAVENOUS | Status: DC | PRN
  Administered 2015-12-03: 3 mg via INTRAVENOUS

## 2015-12-03 MED ORDER — HYDROCODONE-ACETAMINOPHEN 5-325 MG PO TABS: 1.0000 | ORAL_TABLET | Freq: Four times a day (QID) | ORAL | Status: DC | PRN

## 2015-12-03 MED ORDER — BUPIVACAINE LIPOSOME 1.3 % IJ SUSP
INTRAMUSCULAR | Status: DC | PRN
  Administered 2015-12-03: 20 mL

## 2015-12-03 MED ORDER — LACTATED RINGERS IV SOLN
INTRAVENOUS | Status: DC
Start: 2015-12-03 — End: 2015-12-03
  Administered 2015-12-03: 1000 mL via INTRAVENOUS

## 2015-12-03 MED ORDER — SUCCINYLCHOLINE CHLORIDE 20 MG/ML IJ SOLN
INTRAMUSCULAR | Status: AC
  Filled 2015-12-03: qty 1

## 2015-12-03 MED ORDER — PHENYLEPHRINE HCL 10 MG/ML IJ SOLN
INTRAMUSCULAR | Status: DC | PRN
  Administered 2015-12-03: 40 ug via INTRAVENOUS

## 2015-12-03 MED ORDER — POVIDONE-IODINE 10 % OINT PACKET
TOPICAL_OINTMENT | CUTANEOUS | Status: DC | PRN
  Administered 2015-12-03: 1 via TOPICAL

## 2015-12-03 MED ORDER — NEOSTIGMINE METHYLSULFATE 10 MG/10ML IV SOLN
INTRAVENOUS | Status: AC
  Filled 2015-12-03: qty 1

## 2015-12-03 MED ORDER — POVIDONE-IODINE 10 % EX OINT
TOPICAL_OINTMENT | CUTANEOUS | Status: AC
  Filled 2015-12-03: qty 1

## 2015-12-03 MED ORDER — DEXAMETHASONE SODIUM PHOSPHATE 4 MG/ML IJ SOLN
INTRAMUSCULAR | Status: AC
  Filled 2015-12-03: qty 1

## 2015-12-03 MED ORDER — MIDAZOLAM HCL 2 MG/2ML IJ SOLN
1.0000 mg | INTRAMUSCULAR | Status: DC | PRN
  Administered 2015-12-03: 2 mg via INTRAVENOUS

## 2015-12-03 MED ORDER — CEFAZOLIN SODIUM-DEXTROSE 2-3 GM-% IV SOLR
2.0000 g | INTRAVENOUS | Status: AC
  Administered 2015-12-03: 2 g via INTRAVENOUS
  Filled 2015-12-03: qty 50

## 2015-12-03 SURGICAL SUPPLY — 30 items
BAG HAMPER (MISCELLANEOUS) ×3 IMPLANT
CHLORAPREP W/TINT 26ML (MISCELLANEOUS) ×3 IMPLANT
CLOTH BEACON ORANGE TIMEOUT ST (SAFETY) ×3 IMPLANT
COVER LIGHT HANDLE STERIS (MISCELLANEOUS) ×6 IMPLANT
ELECT REM PT RETURN 9FT ADLT (ELECTROSURGICAL) ×3
ELECTRODE REM PT RTRN 9FT ADLT (ELECTROSURGICAL) ×2 IMPLANT
GAUZE SPONGE 4X4 12PLY STRL (GAUZE/BANDAGES/DRESSINGS) ×3 IMPLANT
GLOVE BIOGEL PI IND STRL 7.0 (GLOVE) ×4 IMPLANT
GLOVE BIOGEL PI INDICATOR 7.0 (GLOVE) ×2
GLOVE ECLIPSE 6.5 STRL STRAW (GLOVE) ×3 IMPLANT
GLOVE SURG SS PI 7.5 STRL IVOR (GLOVE) ×3 IMPLANT
GOWN STRL REUS W/ TWL LRG LVL3 (GOWN DISPOSABLE) ×2 IMPLANT
GOWN STRL REUS W/ TWL XL LVL3 (GOWN DISPOSABLE) ×2 IMPLANT
GOWN STRL REUS W/TWL LRG LVL3 (GOWN DISPOSABLE) ×3
GOWN STRL REUS W/TWL XL LVL3 (GOWN DISPOSABLE) ×1
INST SET MINOR GENERAL (KITS) ×3 IMPLANT
KIT ROOM TURNOVER APOR (KITS) ×3 IMPLANT
MANIFOLD NEPTUNE II (INSTRUMENTS) ×3 IMPLANT
MESH VENTRALEX ST 2.5 CRC MED (Mesh General) ×3 IMPLANT
NEEDLE HYPO 21X1.5 SAFETY (NEEDLE) ×3 IMPLANT
NS IRRIG 1000ML POUR BTL (IV SOLUTION) ×3 IMPLANT
PACK ABDOMINAL MAJOR (CUSTOM PROCEDURE TRAY) ×3 IMPLANT
PAD ARMBOARD 7.5X6 YLW CONV (MISCELLANEOUS) ×3 IMPLANT
SET BASIN LINEN APH (SET/KITS/TRAYS/PACK) ×3 IMPLANT
STAPLER VISISTAT (STAPLE) ×3 IMPLANT
SUT ETHIBOND 0 MO6 C/R (SUTURE) ×3 IMPLANT
SUT VIC AB 2-0 CT1 27 (SUTURE) ×1
SUT VIC AB 2-0 CT1 TAPERPNT 27 (SUTURE) ×2 IMPLANT
SYR 20CC LL (SYRINGE) ×3 IMPLANT
TAPE CLOTH SURG 4X10 WHT LF (GAUZE/BANDAGES/DRESSINGS) ×3 IMPLANT

## 2015-12-03 NOTE — Interval H&P Note (Signed)
History and Physical Interval Note:  12/03/2015 8:25 AM  Michelle Zavala  has presented today for surgery, with the diagnosis of incisional hernia  The various methods of treatment have been discussed with the patient and family. After consideration of risks, benefits and other options for treatment, the patient has consented to  Procedure(s): HERNIA REPAIR INCISIONAL WITH MESH (N/A) as a surgical intervention .  The patient's history has been reviewed, patient examined, no change in status, stable for surgery.  I have reviewed the patient's chart and labs.  Questions were answered to the patient's satisfaction.     Aviva Signs A

## 2015-12-03 NOTE — Addendum Note (Signed)
Addendum  created 12/03/15 1018 by Vista Deck, CRNA   Modules edited: Anesthesia Flowsheet

## 2015-12-03 NOTE — Anesthesia Postprocedure Evaluation (Signed)
Anesthesia Post Note  Patient: Michelle Zavala  Procedure(s) Performed: Procedure(s) (LRB): INCISIONAL HERNIORRHAPHY WITH MESH (N/A) INSERTION OF MESH  Patient location during evaluation: PACU Anesthesia Type: General Level of consciousness: awake and alert Pain management: pain level controlled Vital Signs Assessment: post-procedure vital signs reviewed and stable Respiratory status: spontaneous breathing Cardiovascular status: stable Anesthetic complications: no    Last Vitals:  Filed Vitals:   12/03/15 0928 12/03/15 0930  BP: 127/69 127/69  Pulse: 68 66  Temp:    Resp: 21 24    Last Pain: There were no vitals filed for this visit.               Drucie Opitz

## 2015-12-03 NOTE — Progress Notes (Signed)
Awake. Denies pain. resp adequate/nonlabored. O2 sat 88-93% on room air. Encouraged to deep breath. Instructed on incentive spirometer. 1000 ml obtained. Tolerated well. O2 sat increased to 94% and above.

## 2015-12-03 NOTE — Discharge Instructions (Signed)
° ° ° °  Ventral Hernia A ventral hernia (also called an incisional hernia) is a hernia that occurs at the site of a previous surgical cut (incision) in the abdomen. The abdominal wall spans from your lower chest down to your pelvis. If the abdominal wall is weakened from a surgical incision, a hernia can occur. A hernia is a bulge of bowel or muscle tissue pushing out on the weakened part of the abdominal wall. Ventral hernias can get bigger from straining or lifting. Obese and older people are at higher risk for a ventral hernia. People who develop infections after surgery or require repeat incisions at the same site on the abdomen are also at increased risk. CAUSES  A ventral hernia occurs because of weakness in the abdominal wall at an incision site.  SYMPTOMS  Common symptoms include:  A visible bulge or lump on the abdominal wall.  Pain or tenderness around the lump.  Increased discomfort if you cough or make a sudden movement. If the hernia has blocked part of the intestine, a serious complication can occur (incarcerated or strangulated hernia). This can become a problem that requires emergency surgery because the blood flow to the blocked intestine may be cut off. Symptoms may include:  Feeling sick to your stomach (nauseous).  Throwing up (vomiting).  Stomach swelling (distention) or bloating.  Fever.  Rapid heartbeat. DIAGNOSIS  Your health care provider will take a medical history and perform a physical exam. Various tests may be ordered, such as:  Blood tests.  Urine tests.  Ultrasonography.  X-rays.  Computed tomography (CT). TREATMENT  Watchful waiting may be all that is needed for a smaller hernia that does not cause symptoms. Your health care provider may recommend the use of a supportive belt (truss) that helps to keep the abdominal wall intact. For larger hernias or those that cause pain, surgery to repair the hernia is usually recommended. If a hernia becomes  strangulated, emergency surgery needs to be done right away. HOME CARE INSTRUCTIONS  Avoid putting pressure or strain on the abdominal area.  Avoid heavy lifting.  Use good body positioning for physical tasks. Ask your health care provider about proper body positioning.  Use a supportive belt as directed by your health care provider.  Maintain a healthy weight.  Eat foods that are high in fiber, such as whole grains, fruits, and vegetables. Fiber helps prevent difficult bowel movements (constipation).  Drink enough fluids to keep your urine clear or pale yellow.  Follow up with your health care provider as directed. SEEK MEDICAL CARE IF:   Your hernia seems to be getting larger or more painful. SEEK IMMEDIATE MEDICAL CARE IF:   You have abdominal pain that is sudden and sharp.  Your pain becomes severe.  You have repeated vomiting.  You are sweating a lot.  You notice a rapid heartbeat.  You develop a fever. MAKE SURE YOU:   Understand these instructions.  Will watch your condition.  Will get help right away if you are not doing well or get worse.   This information is not intended to replace advice given to you by your health care provider. Make sure you discuss any questions you have with your health care provider.   Document Released: 08/16/2012 Document Revised: 09/20/2014 Document Reviewed: 08/16/2012 Elsevier Interactive Patient Education 2016 [employer]  
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
## 2015-12-03 NOTE — Anesthesia Procedure Notes (Addendum)
Procedure Name: Intubation Date/Time: 12/03/2015 8:45 AM Performed by: Vista Deck Pre-anesthesia Checklist: Patient identified, Patient being monitored, Timeout performed, Emergency Drugs available and Suction available Patient Re-evaluated:Patient Re-evaluated prior to inductionOxygen Delivery Method: Circle System Utilized Preoxygenation: Pre-oxygenation with 100% oxygen Intubation Type: IV induction, Rapid sequence and Cricoid Pressure applied Laryngoscope Size: Glidescope and 3 Grade View: Grade I Tube type: Oral Tube size: 7.0 mm Number of attempts: 1 Airway Equipment and Method: Stylet and Oral airway Placement Confirmation: ETT inserted through vocal cords under direct vision,  positive ETCO2 and breath sounds checked- equal and bilateral Secured at: 22 cm Tube secured with: Tape Dental Injury: Teeth and Oropharynx as per pre-operative assessment

## 2015-12-03 NOTE — Transfer of Care (Signed)
Immediate Anesthesia Transfer of Care Note  Patient: Michelle Zavala  Procedure(s) Performed: Procedure(s): INCISIONAL HERNIORRHAPHY WITH MESH (N/A) INSERTION OF MESH  Patient Location: PACU  Anesthesia Type:General  Level of Consciousness: awake and patient cooperative  Airway & Oxygen Therapy: Patient Spontanous Breathing and non-rebreather face mask  Post-op Assessment: Report given to RN, Post -op Vital signs reviewed and stable and Patient moving all extremities  Post vital signs: Reviewed and stable    Complications: No apparent anesthesia complications

## 2015-12-03 NOTE — Op Note (Signed)
Patient:  Michelle Zavala  DOB:  Jul 19, 1946  MRN:  [id number]   Preop Diagnosis:  Incisional hernia  Postop Diagnosis:  Same  Procedure:  Incisional herniorrhaphy with mesh  Surgeon:  Aviva Signs, M.D.  Anes:  Gen. endotracheal  Indications:  Patient is a 70 year old white female status post an open cholecystectomy in the remote past who now presents with an incisional hernia along the medial aspect of the right upper quadrant incision. The risks and benefits of the procedure including bleeding, infection, mesh use, and a possibly recurrence of the hernia were fully explained to the patient, who gave informed consent.  Procedure note:  The patient was placed the supine position. After induction of general endotracheal anesthesia, the abdomen was prepped and draped using the usual sterile technique with DuraPrep. Surgical site confirmation was performed.  An incision was made through the previous surgical incision site. The dissection was taken down to the fascia. A 2-3 cm defect was noted along the medial aspect of the incision. Omentum was noted to be emanating through this. The omentum was freed away from its adhesions to the fascia circumferentially and reduced. The fascial line was then digitally inspected and no other fascial defects were noted. A 6.4 cm Bard ventralax ST patch was then inserted and secured to the fascia using 0 Ethibond interrupted sutures. The overlying fascia was reapproximated using 0 Ethibond interrupted sutures. The subcutaneous layer was reapproximated using 2-0 Vicryl interrupted sutures. Exparel was instilled into the surrounding wound. The skin was closed using staples. Betadine ointment and dry sterile dressings were applied.    All tape and needle counts were correct at the end of the procedure. Patient was extubated in the operating room and transferred to PACU in stable condition.  Complications:  None  EBL:  Minimal  Specimen:  None

## 2015-12-04 ENCOUNTER — Encounter (HOSPITAL_COMMUNITY): Payer: Self-pay | Admitting: General Surgery

## 2015-12-09 ENCOUNTER — Inpatient Hospital Stay: Admission: RE | Admit: 2015-12-09 | Payer: Medicare Other | Source: Ambulatory Visit

## 2015-12-18 ENCOUNTER — Other Ambulatory Visit (HOSPITAL_COMMUNITY)
Admission: RE | Admit: 2015-12-18 | Discharge: 2015-12-18 | Disposition: A | Discharge status: Home / Self Care | Payer: Medicare Other | Source: Ambulatory Visit | Attending: Interventional Radiology | Admitting: Interventional Radiology

## 2015-12-18 ENCOUNTER — Ambulatory Visit
Admission: RE | Admit: 2015-12-18 | Discharge: 2015-12-18 | Disposition: A | Discharge status: Home / Self Care | Payer: Medicare Other | Source: Ambulatory Visit | Attending: Otolaryngology | Admitting: Otolaryngology

## 2015-12-18 DIAGNOSIS — E041 Nontoxic single thyroid nodule: Secondary | ICD-10-CM | POA: Insufficient documentation

## 2015-12-25 ENCOUNTER — Telehealth: Payer: Self-pay | Admitting: *Deleted

## 2015-12-25 NOTE — Telephone Encounter (Signed)
Patient called requesting an appointment for pain in the posterior, lateral side of left breast last night rated 6-7/10 and also c/o of flutters and sob. Patient denied dizziness. Patient does not have nitroglycerin at home. Patient is not having active chest pain and says that she has not experienced any pain today. Patient was offered an appointment next week on 12/30/15 but declined saying she already has an appointment that day. Patient request the first available appointment with Dr. Domenic Polite. Patient advised that if her symptoms returned or got worse, that she needed to go to the ED for an evaluation. Patient verbalized understanding of plan.

## 2016-01-12 ENCOUNTER — Emergency Department (HOSPITAL_COMMUNITY)
Admission: EM | Admit: 2016-01-12 | Discharge: 2016-01-12 | Disposition: A | Discharge status: Home / Self Care | Payer: Medicare Other | Attending: Emergency Medicine | Admitting: Emergency Medicine

## 2016-01-12 ENCOUNTER — Encounter (HOSPITAL_COMMUNITY): Payer: Self-pay

## 2016-01-12 ENCOUNTER — Emergency Department (HOSPITAL_COMMUNITY): Payer: Medicare Other

## 2016-01-12 DIAGNOSIS — J449 Chronic obstructive pulmonary disease, unspecified: Secondary | ICD-10-CM | POA: Diagnosis not present

## 2016-01-12 DIAGNOSIS — R000 Tachycardia, unspecified: Secondary | ICD-10-CM | POA: Insufficient documentation

## 2016-01-12 DIAGNOSIS — M1990 Unspecified osteoarthritis, unspecified site: Secondary | ICD-10-CM | POA: Insufficient documentation

## 2016-01-12 DIAGNOSIS — M545 Low back pain: Secondary | ICD-10-CM | POA: Insufficient documentation

## 2016-01-12 DIAGNOSIS — Z7982 Long term (current) use of aspirin: Secondary | ICD-10-CM | POA: Insufficient documentation

## 2016-01-12 DIAGNOSIS — I2510 Atherosclerotic heart disease of native coronary artery without angina pectoris: Secondary | ICD-10-CM | POA: Insufficient documentation

## 2016-01-12 DIAGNOSIS — F329 Major depressive disorder, single episode, unspecified: Secondary | ICD-10-CM | POA: Insufficient documentation

## 2016-01-12 DIAGNOSIS — Z79899 Other long term (current) drug therapy: Secondary | ICD-10-CM | POA: Diagnosis not present

## 2016-01-12 DIAGNOSIS — Z9049 Acquired absence of other specified parts of digestive tract: Secondary | ICD-10-CM | POA: Insufficient documentation

## 2016-01-12 DIAGNOSIS — I10 Essential (primary) hypertension: Secondary | ICD-10-CM | POA: Insufficient documentation

## 2016-01-12 DIAGNOSIS — R1084 Generalized abdominal pain: Secondary | ICD-10-CM

## 2016-01-12 DIAGNOSIS — R1010 Upper abdominal pain, unspecified: Secondary | ICD-10-CM | POA: Diagnosis present

## 2016-01-12 LAB — URINALYSIS, ROUTINE W REFLEX MICROSCOPIC
Bilirubin Urine: NEGATIVE
Glucose, UA: NEGATIVE mg/dL
Hgb urine dipstick: NEGATIVE
Ketones, ur: NEGATIVE mg/dL
Leukocytes, UA: NEGATIVE
Nitrite: NEGATIVE
Protein, ur: NEGATIVE mg/dL
Specific Gravity, Urine: 1.01 (ref 1.005–1.030)
pH: 6 (ref 5.0–8.0)

## 2016-01-12 LAB — CBC WITH DIFFERENTIAL/PLATELET
Basophils Absolute: 0 10*3/uL (ref 0.0–0.1)
Basophils Relative: 0 %
Eosinophils Absolute: 0.1 10*3/uL (ref 0.0–0.7)
Eosinophils Relative: 1 %
HCT: 41.7 % (ref 36.0–46.0)
Hemoglobin: 14.4 g/dL (ref 12.0–15.0)
Lymphocytes Relative: 36 %
Lymphs Abs: 3.7 10*3/uL (ref 0.7–4.0)
MCH: 30.2 pg (ref 26.0–34.0)
MCHC: 34.5 g/dL (ref 30.0–36.0)
MCV: 87.4 fL (ref 78.0–100.0)
Monocytes Absolute: 1 10*3/uL (ref 0.1–1.0)
Monocytes Relative: 9 %
Neutro Abs: 5.6 10*3/uL (ref 1.7–7.7)
Neutrophils Relative %: 54 %
Platelets: 240 10*3/uL (ref 150–400)
RBC: 4.77 MIL/uL (ref 3.87–5.11)
RDW: 13 % (ref 11.5–15.5)
WBC: 10.4 10*3/uL (ref 4.0–10.5)

## 2016-01-12 LAB — COMPREHENSIVE METABOLIC PANEL
ALT: 18 U/L (ref 14–54)
AST: 24 U/L (ref 15–41)
Albumin: 4.2 g/dL (ref 3.5–5.0)
Alkaline Phosphatase: 63 U/L (ref 38–126)
Anion gap: 12 (ref 5–15)
BUN: 13 mg/dL (ref 6–20)
CO2: 25 mmol/L (ref 22–32)
Calcium: 9.6 mg/dL (ref 8.9–10.3)
Chloride: 103 mmol/L (ref 101–111)
Creatinine, Ser: 0.97 mg/dL (ref 0.44–1.00)
GFR calc Af Amer: >60 mL/min (ref >60)
GFR calc non Af Amer: 58 mL/min — ABNORMAL LOW (ref >60)
Glucose, Bld: 126 mg/dL — ABNORMAL HIGH (ref 65–99)
Potassium: 3.5 mmol/L (ref 3.5–5.1)
Sodium: 140 mmol/L (ref 135–145)
Total Bilirubin: 0.7 mg/dL (ref 0.3–1.2)
Total Protein: 7.6 g/dL (ref 6.5–8.1)

## 2016-01-12 LAB — LIPASE, BLOOD: Lipase: 23 U/L (ref 11–51)

## 2016-01-12 LAB — I-STAT CG4 LACTIC ACID, ED: Lactic Acid, Venous: 2.8 mmol/L (ref 0.5–2.0)

## 2016-01-12 MED ORDER — TRAMADOL HCL 50 MG PO TABS: 50.0000 mg | ORAL_TABLET | Freq: Four times a day (QID) | ORAL | Status: DC | PRN

## 2016-01-12 MED ORDER — NAPROXEN 500 MG PO TABS: 500.0000 mg | ORAL_TABLET | Freq: Two times a day (BID) | ORAL | Status: DC

## 2016-01-12 MED ORDER — SODIUM CHLORIDE 0.9 % IV BOLUS (SEPSIS)
1000.0000 mL | Freq: Once | INTRAVENOUS | Status: AC
  Administered 2016-01-12: 1000 mL via INTRAVENOUS

## 2016-01-12 MED ORDER — DOCUSATE SODIUM 100 MG PO CAPS: 100.0000 mg | ORAL_CAPSULE | Freq: Two times a day (BID) | ORAL | Status: DC

## 2016-01-12 MED ORDER — ONDANSETRON HCL 4 MG/2ML IJ SOLN
4.0000 mg | Freq: Once | INTRAMUSCULAR | Status: AC
  Administered 2016-01-12: 4 mg via INTRAVENOUS
  Filled 2016-01-12: qty 2

## 2016-01-12 MED ORDER — ONDANSETRON 4 MG PO TBDP: 4.0000 mg | ORAL_TABLET | Freq: Three times a day (TID) | ORAL | Status: DC | PRN

## 2016-01-12 MED ORDER — DIATRIZOATE MEGLUMINE & SODIUM 66-10 % PO SOLN
ORAL | Status: AC
  Filled 2016-01-12: qty 30

## 2016-01-12 MED ORDER — IOPAMIDOL (ISOVUE-300) INJECTION 61%
100.0000 mL | Freq: Once | INTRAVENOUS | Status: AC | PRN
  Administered 2016-01-12: 100 mL via INTRAVENOUS

## 2016-01-12 MED ORDER — HYDROMORPHONE HCL 1 MG/ML IJ SOLN
0.5000 mg | Freq: Once | INTRAMUSCULAR | Status: AC
  Administered 2016-01-12: 0.5 mg via INTRAVENOUS
  Filled 2016-01-12: qty 1

## 2016-01-12 NOTE — Discharge Instructions (Signed)

## 2016-01-12 NOTE — ED Provider Notes (Signed)
CSN: [id number]     Arrival date & time 01/12/16  1250 History   First MD Initiated Contact with Patient 01/12/16 1311     Chief Complaint  Patient presents with  . Abdominal Pain     (Consider location/radiation/quality/duration/timing/severity/associated sxs/prior Treatment) HPI Comments: The patient is a 70 year old female, approximately one month ago she had a repair of a ventral wall hernia by Dr. Arnoldo Morale, over the last 2 weeks she reports that she has been having some intermittent abnormal nausea and vomiting when she has a bowel movement however within the last 2 days she has developed an episode of significant vomiting followed by some abdominal discomfort which has been persistent for the last 2 days. She is nauseated, she reports that the pain is located in the upper and lower abdomen with some lower back pain as well. She is nauseated but has had no food or significant amount of water in the last 24 hours, she is not had a bowel movement in 24 hours, she is having decreased amounts of urination. No fevers, no leg swelling, no rashes, no headaches, no numbness or weakness.  Patient is a 70 y.o. female presenting with abdominal pain. The history is provided by the patient and medical records.  Abdominal Pain   Past Medical History  Diagnosis Date  . Coronary atherosclerosis of native coronary artery     Mild atherosclerosis 3/10, LVEF 60-65%  . Depression   . GERD (gastroesophageal reflux disease)   . Diverticulosis   . Anxiety   . Chronic lung disease     Fibrosis - Dr. Koleen Nimrod  . Essential hypertension, benign   . PSVT (paroxysmal supraventricular tachycardia) ([place])   . C2 cervical fracture ([place]) 09/17/13    Traumatic fracture witth minimal displacement  . Arthritis   . COPD (chronic obstructive pulmonary disease) [facility])    Past Surgical History  Procedure Laterality Date  . Cholecystectomy    . Cesarean section    . Total knee arthroplasty Bilateral   . Anterior  release vertebral body w/ posterior fusion    . Abdominal hysterectomy    . Tubal ligation    . Tmj arthroscopy Bilateral 02/04/2015    Procedure: BILATERAL TEMPOROMANDIBULAR JOINT (TMJ) ARTHROSCOPY MENISECTOMY WITH FAT GRAFT FROM ABDOMEN ;  Surgeon: Diona Browner, DDS;  Location: [place];  Service: Oral Surgery;  Laterality: Bilateral;  . Esophagogastroduodenoscopy (egd) with propofol N/A 08/04/2015    Dr. Rourk:abnormal gastric mucosa s/p biopsy. Reactive gastropathy. Negative H.pylori  . Biopsy N/A 08/04/2015    Procedure: BIOPSY;  Surgeon: Daneil Dolin, MD;  Location: [facility];  Service: Endoscopy;  Laterality: N/A;  Gastric  . Incisional hernia repair N/A 12/03/2015    Procedure: [name] HERNIORRHAPHY WITH MESH;  Surgeon: Aviva Signs, MD;  Location: [facility];  Service: General;  Laterality: N/A;  . Insertion of mesh  12/03/2015    Procedure: INSERTION OF MESH;  Surgeon: Aviva Signs, MD;  Location: [facility];  Service: General;;   Family History  Problem Relation Age of Onset  . Coronary artery disease Father     Premature  . Heart disease Father   . Coronary artery disease Brother     Premature  . Coronary artery disease Sister     Premature  . Colon cancer Neg Hx    Social History  Substance Use Topics  . Smoking status: Never Smoker   . Smokeless tobacco: Never Used  . Alcohol Use: No   OB History    No  data available     Review of Systems  Gastrointestinal: Positive for abdominal pain.  All other systems reviewed and are negative.     Allergies  Sulfonamide derivatives  Home Medications   Prior to Admission medications   Medication Sig Start Date End Date Taking? Authorizing Provider  acetaminophen (TYLENOL) 650 MG CR tablet Take 1,300 mg by mouth at bedtime. Reported on 09/29/2015   Yes Historical Provider, MD  alfuzosin (UROXATRAL) 10 MG 24 hr tablet Take 1 tablet by mouth daily. 11/24/15  Yes Historical Provider, MD  amitriptyline (ELAVIL) 75 MG tablet Take 75 mg  by mouth at bedtime. 01/27/15  Yes Historical Provider, MD  Aspirin-Salicylamide-Caffeine (ARTHRITIS STRENGTH BC POWDER PO) Take 1 packet by mouth every morning.   Yes Historical Provider, MD  budesonide-formoterol (SYMBICORT) 160-4.5 MCG/ACT inhaler Inhale 2 puffs into the lungs 2 (two) times daily. 09/28/13  Yes Daniel J Angiulli, PA-C  Cyanocobalamin (VITAMIN B-12 PO) Take 1 tablet by mouth daily.   Yes Historical Provider, MD  fluticasone (FLONASE) 50 MCG/ACT nasal spray Place 1 spray into the nose daily as needed for allergies.    Yes Historical Provider, MD  hydrochlorothiazide (HYDRODIURIL) 25 MG tablet Take 1 tablet (25 mg total) by mouth daily. 09/28/13  Yes Daniel J Angiulli, PA-C  HYDROcodone-acetaminophen (NORCO) 10-325 MG tablet Take 0.5 tablets by mouth 2 (two) times daily as needed for moderate pain.  09/18/15  Yes Historical Provider, MD  ipratropium-albuterol (DUONEB) 0.5-2.5 (3) MG/3ML SOLN Take 3 mLs by nebulization 2 (two) times daily as needed (shortness of breath).  12/24/14  Yes Historical Provider, MD  LORazepam (ATIVAN) 2 MG tablet Take 2-4 mg by mouth See admin instructions. Take 2 mg by mouth in the morning, 2 mg at noon, and 4 mg at bedtime   Yes Historical Provider, MD  Melatonin 5 MG TABS Take 5 mg by mouth at bedtime.   Yes Historical Provider, MD  metoprolol succinate (TOPROL-XL) 100 MG 24 hr tablet TAKE ONE TABLET BY MOUTH DAILY. 10/10/15  Yes Satira Sark, MD  Multiple Vitamins-Minerals (HAIR/SKIN/NAILS PO) Take 1 tablet by mouth daily.   Yes Historical Provider, MD  pantoprazole (PROTONIX) 40 MG tablet Take 1 tablet (40 mg total) by mouth 2 (two) times daily. 09/28/13  Yes Daniel J Angiulli, PA-C  sertraline (ZOLOFT) 100 MG tablet Take 100 mg by mouth daily.     Yes Historical Provider, MD  Vitamin D, Ergocalciferol, (DRISDOL) 50000 UNITS CAPS capsule Take 50,000 Units by mouth 2 (two) times a week. Take on Wednesday and Saturday.   Yes Historical Provider, MD  docusate  sodium (COLACE) 100 MG capsule Take 1 capsule (100 mg total) by mouth every 12 (twelve) hours. 01/12/16   Noemi Chapel, MD  HYDROcodone-acetaminophen (NORCO/VICODIN) 5-325 MG tablet Take 1 tablet by mouth every 6 (six) hours as needed for moderate pain. Patient not taking: Reported on 01/12/2016 12/03/15 12/02/16  Aviva Signs, MD  naproxen (NAPROSYN) 500 MG tablet Take 1 tablet (500 mg total) by mouth 2 (two) times daily with a meal. 01/12/16   Noemi Chapel, MD  ondansetron (ZOFRAN ODT) 4 MG disintegrating tablet Take 1 tablet (4 mg total) by mouth every 8 (eight) hours as needed for nausea. 01/12/16   Noemi Chapel, MD  traMADol (ULTRAM) 50 MG tablet Take 1 tablet (50 mg total) by mouth every 6 (six) hours as needed. 01/12/16   Noemi Chapel, MD   BP 123/89 mmHg  Pulse 79  Temp(Src) 98.2 F (36.8 C) (  Oral)  Resp 18  Ht 5\' 5"  (1.651 m)  Wt 200 lb (90.719 kg)  BMI 33.28 kg/m2  SpO2 98% Physical Exam  Constitutional: She appears well-developed and well-nourished. No distress.  HENT:  Head: Normocephalic and atraumatic.  Mouth/Throat: Oropharynx is clear and moist. No oropharyngeal exudate.  Eyes: Conjunctivae and EOM are normal. Pupils are equal, round, and reactive to light. Right eye exhibits no discharge. Left eye exhibits no discharge. No scleral icterus.  Neck: Normal range of motion. Neck supple. No JVD present. No thyromegaly present.  Cardiovascular: Regular rhythm, normal heart sounds and intact distal pulses.  Exam reveals no gallop and no friction rub.   No murmur heard. Mild tachycardia  Pulmonary/Chest: Effort normal and breath sounds normal. No respiratory distress. She has no wheezes. She has no rales.  Abdominal: Soft. She exhibits no distension and no mass. There is tenderness (diffuse mild abdominal tenderness to palpation, moderate to severe epigastric tenderness, no guarding, no peritoneal signs, decreased bowel sounds).  Surgical scar appears well-healed in the supraumbilical  location  Musculoskeletal: Normal range of motion. She exhibits no edema or tenderness.  Lymphadenopathy:    She has no cervical adenopathy.  Neurological: She is alert. Coordination normal.  Skin: Skin is warm and dry. No rash noted. No erythema.  Psychiatric: She has a normal mood and affect. Her behavior is normal.  Nursing note and vitals reviewed.   ED Course  Procedures (including critical care time) Labs Review Labs Reviewed  COMPREHENSIVE METABOLIC PANEL - Abnormal; Notable for the following:    Glucose, Bld 126 (*)    GFR calc non Af Amer 58 (*)    All other components within normal limits  I-STAT CG4 LACTIC ACID, ED - Abnormal; Notable for the following:    Lactic Acid, Venous 2.80 (*)    All other components within normal limits  LIPASE, BLOOD  CBC WITH DIFFERENTIAL/PLATELET  URINALYSIS, ROUTINE W REFLEX MICROSCOPIC (NOT AT [facility])  I-STAT CG4 LACTIC ACID, ED    Imaging Review Ct Abdomen Pelvis W Contrast  01/12/2016  CLINICAL DATA:  General abdominal pain, with nausea and vaginal itching per patient x 2 weeks, worse in last 3 days. EXAM: CT ABDOMEN AND PELVIS WITH CONTRAST TECHNIQUE: Multidetector CT imaging of the abdomen and pelvis was performed using the standard protocol following bolus administration of intravenous contrast. CONTRAST:  140mL ISOVUE-300 IOPAMIDOL (ISOVUE-300) INJECTION 61% COMPARISON:  08/22/2015 FINDINGS: Lower chest: Very mild subpleural opacities in the lateral lung bases bilaterally similar to prior study likely reflecting mild fibrosis. Hepatobiliary: Status post cholecystectomy.  Liver is normal. Pancreas: Nor Spleen: Normal Adrenals/Urinary Tract: Adrenal glands are normal. Bilateral renal cortical atrophy. No hydronephrosis. Bladder normal. Stomach/Bowel: Tiny hiatal hernia. Nonobstructive bowel gas pattern. Significant diverticulosis involving sigmoid colon without evidence of diverticulitis. Focal narrowing of the colon at the hepatic flexure to a  diameter of 17 mm over a course of about 10 cm. Small bowel normal. Appendix not identified. Vascular/Lymphatic: No significant abdominal or pelvic adenopathy. No acute vascular abnormalities. There is ectasia of the aorta with mild to moderate calcification. Reproductive: Uterus is surgically absent. There are no pelvic masses. Other: There is no free fluid. There is no inflammatory change in the region of the perineum. There is increased attenuation in the subcutaneous soft tissues of the anterior right abdominal wall, measuring 9 cm x 1.6 cm. This is seen in the abdominal wall overlying an area of prior hernia. There is no soft tissue attenuation at the hernia  site consistent with history of repair and there is a 42 by 8 mm fluid collection along the deep surface of the hernia repair site consistent with small postoperative fluid collection. Musculoskeletal: There are no acute musculoskeletal findings. There is severe T12 compression deformity and fragmentation with significant retropulsion causing canal narrowing. This is however stable. IMPRESSION: Postoperative change anterior abdominal wall right upper quadrant in area of prior hernia repair 10 cm length of large bowel narrowing in the region of the hepatic flexure. This may be transient. A mass is not appreciated. Single-contrast barium enema could be considered to confirm the absence of a fixed structural lesion. Sigmoid diverticulosis Severe stable T12 compression deformity with retropulsion. Electronically Signed   By: Skipper Cliche M.D.   On: 01/12/2016 16:42   I have personally reviewed and evaluated these images and lab results as part of my medical decision-making.   EKG Interpretation None      MDM   Final diagnoses:  Generalized abdominal pain    Vital signs show mild tachycardia, she does not have a fever, she otherwise appears like she is in no distress but is nauseated, rule out bowel obstruction or other intra-abdominal  pathologic sources of her pain. Pain medication nausea medication, labs, the patient is in agreement with the plan. IV hydration.  Change of shift - care signed out to Dr. Roderic Palau pending UA CT negative Stable appearing Labs reviewed - normal WBC, normal renal Pt informed - feeling better after meds / efluids.  Meds given in ED:  Medications  diatrizoate meglumine-sodium (GASTROGRAFIN) 66-10 % solution (not administered)  ondansetron (ZOFRAN) injection 4 mg (4 mg Intravenous Given 01/12/16 1335)  sodium chloride 0.9 % bolus 1,000 mL (0 mLs Intravenous Stopped 01/12/16 1518)  HYDROmorphone (DILAUDID) injection 0.5 mg (0.5 mg Intravenous Given 01/12/16 1335)  iopamidol (ISOVUE-300) 61 % injection 100 mL (100 mLs Intravenous Contrast Given 01/12/16 1612)    New Prescriptions   DOCUSATE SODIUM (COLACE) 100 MG CAPSULE    Take 1 capsule (100 mg total) by mouth every 12 (twelve) hours.   NAPROXEN (NAPROSYN) 500 MG TABLET    Take 1 tablet (500 mg total) by mouth 2 (two) times daily with a meal.   ONDANSETRON (ZOFRAN ODT) 4 MG DISINTEGRATING TABLET    Take 1 tablet (4 mg total) by mouth every 8 (eight) hours as needed for nausea.   TRAMADOL (ULTRAM) 50 MG TABLET    Take 1 tablet (50 mg total) by mouth every 6 (six) hours as needed.      Noemi Chapel, MD 01/12/16 [phone]

## 2016-01-12 NOTE — ED Notes (Signed)
C/o abdominal pain with nausea and vaginal itching per patient. Patient states "ive been sick for 2 weeks, and it got worse 3 days ago.

## 2016-01-28 ENCOUNTER — Encounter: Payer: Self-pay | Admitting: Cardiology

## 2016-01-28 ENCOUNTER — Ambulatory Visit (INDEPENDENT_AMBULATORY_CARE_PROVIDER_SITE_OTHER): Payer: Medicare Other | Admitting: Gastroenterology

## 2016-01-28 ENCOUNTER — Ambulatory Visit (INDEPENDENT_AMBULATORY_CARE_PROVIDER_SITE_OTHER): Payer: Medicare Other | Admitting: Cardiology

## 2016-01-28 ENCOUNTER — Encounter: Payer: Self-pay | Admitting: Gastroenterology

## 2016-01-28 VITALS — BP 132/92 | HR 65 | Ht 65.0 in | Wt 209.0 lb

## 2016-01-28 VITALS — BP 141/84 | HR 64 | Temp 98.3°F | Ht 65.0 in | Wt 207.4 lb

## 2016-01-28 DIAGNOSIS — R197 Diarrhea, unspecified: Secondary | ICD-10-CM

## 2016-01-28 DIAGNOSIS — I2510 Atherosclerotic heart disease of native coronary artery without angina pectoris: Secondary | ICD-10-CM | POA: Diagnosis not present

## 2016-01-28 DIAGNOSIS — R140 Abdominal distension (gaseous): Secondary | ICD-10-CM

## 2016-01-28 DIAGNOSIS — I471 Supraventricular tachycardia: Secondary | ICD-10-CM | POA: Diagnosis not present

## 2016-01-28 DIAGNOSIS — I10 Essential (primary) hypertension: Secondary | ICD-10-CM

## 2016-01-28 DIAGNOSIS — R1013 Epigastric pain: Secondary | ICD-10-CM

## 2016-01-28 DIAGNOSIS — J8410 Pulmonary fibrosis, unspecified: Secondary | ICD-10-CM

## 2016-01-28 MED ORDER — DICYCLOMINE HCL 10 MG PO CAPS: 10.0000 mg | ORAL_CAPSULE | Freq: Three times a day (TID) | ORAL | Status: DC

## 2016-01-28 MED ORDER — HYDROCORTISONE 2.5 % RE CREA: 1.0000 "application " | TOPICAL_CREAM | Freq: Two times a day (BID) | RECTAL | Status: DC

## 2016-01-28 NOTE — Progress Notes (Signed)
Referring Provider: Octavio Graves, DO Primary Care Physician:  Octavio Graves, DO  Primary GI: Dr. Gala Romney   Chief Complaint  Patient presents with  . Follow-up  . Abdominal Pain    HPI:   Michelle Zavala is a 70 y.o. female presenting today with a history of epigastric pain, s/p EGD with only reactive gastropathy found. Negative H.pylori. 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 Progression of SMA disease could cause issues, but clinical monitoring recommended. She was seen by Vascular Surgery due to renal artery disease. Medical therapy recommended. Symptomatic fat-containing ventral hernia noted at last visit in March 2017, undergoing hernia repair in March 2017. At last visit, intermittent loose stools appeared to be dietary related. Here for routine follow-up. She has been overall in the weights of 208/209. Today 207. Overall, no outstanding weight loss noted.   Loose stools after eating. Pain with eating in epigastric area . Pain improved somewhat after diarrhea. Not a candidate for Viberzi due to post-cholecystectomy state.  Diarrhea worse in the last 2 months. Nausea while sitting on the toilet. Not every day. Sometimes will have to heave, feels like she is going to pass out.   CT 01/12/16 showed a 10 cm length of large bowel narrowing in the region of the hepatic flexure, may be transient. No mass. Single contrast barium enema could be considered to confirm the absence of a fixed structural lesion.   Past Medical History  Diagnosis Date  . Coronary atherosclerosis of native coronary artery     Mild atherosclerosis 3/10, LVEF 60-65%  . Depression   . GERD (gastroesophageal reflux disease)   . Diverticulosis   . Anxiety   . Chronic lung disease     Fibrosis - Dr. Koleen Nimrod  . Essential hypertension, benign   . PSVT (paroxysmal supraventricular tachycardia) ([place])   . C2 cervical fracture ([place]) 09/17/13    Traumatic fracture witth minimal  displacement  . Arthritis   . COPD (chronic obstructive pulmonary disease) [facility])     Past Surgical History  Procedure Laterality Date  . Cholecystectomy    . Cesarean section    . Total knee arthroplasty Bilateral   . Anterior release vertebral body w/ posterior fusion    . Abdominal hysterectomy    . Tubal ligation    . Tmj arthroscopy Bilateral 02/04/2015    Procedure: BILATERAL TEMPOROMANDIBULAR JOINT (TMJ) ARTHROSCOPY MENISECTOMY WITH FAT GRAFT FROM ABDOMEN ;  Surgeon: Diona Browner, DDS;  Location: [place];  Service: Oral Surgery;  Laterality: Bilateral;  . Esophagogastroduodenoscopy (egd) with propofol N/A 08/04/2015    Dr. Rourk:abnormal gastric mucosa s/p biopsy. Reactive gastropathy. Negative H.pylori  . Biopsy N/A 08/04/2015    Procedure: BIOPSY;  Surgeon: Daneil Dolin, MD;  [facility];  Service: Endoscopy;  Laterality: N/A;  Gastric  . Incisional hernia repair N/A 12/03/2015    Procedure: [name] HERNIORRHAPHY WITH MESH;  Surgeon: Aviva Signs, MD;  Location: [facility];  Service: General;  Laterality: N/A;  . Insertion of mesh  12/03/2015    Procedure: INSERTION OF MESH;  Surgeon: Aviva Signs, MD;  [facility]: [facility];  Service: General;;    Current Outpatient Prescriptions  Medication Sig Dispense Refill  . acetaminophen (TYLENOL) 650 MG CR tablet Take 1,300 mg by mouth at bedtime. Reported on 09/29/2015    . amitriptyline (ELAVIL) 75 MG tablet Take 75 mg by mouth at bedtime.  6  . Aspirin-Salicylamide-Caffeine (ARTHRITIS STRENGTH BC POWDER  PO) Take 1 packet by mouth every morning.    . budesonide-formoterol (SYMBICORT) 160-4.5 MCG/ACT inhaler Inhale 2 puffs into the lungs 2 (two) times daily. 1 Inhaler 12  . docusate sodium (COLACE) 100 MG capsule Take 1 capsule (100 mg total) by mouth every 12 (twelve) hours. (Patient not taking: Reported on 01/28/2016) 30 capsule 0  . fluticasone (FLONASE) 50 MCG/ACT nasal spray Place 1 spray into the nose daily as needed for allergies.      . hydrochlorothiazide (HYDRODIURIL) 25 MG tablet Take 1 tablet (25 mg total) by mouth daily. 30 tablet 1  . HYDROcodone-acetaminophen (NORCO) 10-325 MG tablet Take 0.5 tablets by mouth 2 (two) times daily as needed for moderate pain.   0  . ipratropium-albuterol (DUONEB) 0.5-2.5 (3) MG/3ML SOLN Take 3 mLs by nebulization 2 (two) times daily as needed (shortness of breath).   0  . LORazepam (ATIVAN) 2 MG tablet Take 2-4 mg by mouth See admin instructions. Take 2 mg by mouth in the morning, 2 mg at noon, and 4 mg at bedtime    . Melatonin 5 MG TABS Take 5 mg by mouth at bedtime.    . metoprolol succinate (TOPROL-XL) 100 MG 24 hr tablet TAKE ONE TABLET BY MOUTH DAILY. 30 tablet 6  . ondansetron (ZOFRAN ODT) 4 MG disintegrating tablet Take 1 tablet (4 mg total) by mouth every 8 (eight) hours as needed for nausea. 10 tablet 0  . pantoprazole (PROTONIX) 40 MG tablet Take 1 tablet (40 mg total) by mouth 2 (two) times daily. 60 tablet 1  . sertraline (ZOLOFT) 100 MG tablet Take 100 mg by mouth daily.      . Vitamin D, Ergocalciferol, (DRISDOL) 50000 UNITS CAPS capsule Take 50,000 Units by mouth 2 (two) times a week. Take on Wednesday and Saturday.     No current facility-administered medications for this visit.    Allergies as of 01/28/2016 - Review Complete 01/28/2016  Allergen Reaction Noted  . Sulfonamide derivatives Hives     Family History  Problem Relation Age of Onset  . Coronary artery disease Father     Premature  . Heart disease Father   . Coronary artery disease Brother     Premature  . Coronary artery disease Sister     Premature  . Colon cancer Neg Hx     Social History   Social History  . Marital Status: Widowed    Spouse Name: N/A  . Number of Children: 3  . Years of Education: 7   Occupational History  . Retired    Social History Main Topics  . Smoking status: Never Smoker   . Smokeless tobacco: Never Used  . Alcohol Use: No  . Drug Use: No  . Sexual  Activity: No   Other Topics Concern  . None   Social History Narrative   Occasionally drinks [employer]eps[employer]     Review of Systems: As mentioned in HPI.   Physical Exam: BP 141/84 mmHg  Pulse 64  Temp(Src) 98.3 F (36.8 C) (Oral)  Ht 5\' 5"  (1.651 m)  Wt 207 lb 7.2 oz (94.099 kg)  BMI 34.52 kg/m2 General:   Alert and oriented. No distress noted. Pleasant and cooperative.  Head:  Normocephalic and atraumatic. Abdomen:  +BS, soft, TTP upper abdomen and non-distended. No rebound or guarding. Obese.  Msk:  Symmetrical without gross deformities. Normal posture. Extremities:  Without edema. Neurologic:  Alert and  oriented x4;  grossly normal neurologically. Psych:  Alert and cooperative. Normal  mood and affect.   Lab Results  Component Value Date   WBC 10.4 01/12/2016   HGB 14.4 01/12/2016   HCT 41.7 01/12/2016   MCV 87.4 01/12/2016   PLT 240 01/12/2016   Lab Results  Component Value Date   CREATININE 0.97 01/12/2016   BUN 13 01/12/2016   NA 140 01/12/2016   K 3.5 01/12/2016   CL 103 01/12/2016   CO2 25 01/12/2016   Lab Results  Component Value Date   ALT 18 01/12/2016   AST 24 01/12/2016   ALKPHOS 63 01/12/2016   BILITOT 0.7 01/12/2016

## 2016-01-28 NOTE — Patient Instructions (Signed)
Your physician recommends that you continue on your current medications as directed. Please refer to the Current Medication list given to you today. Your physician recommends that you schedule a follow-up appointment in: 6 months. You will receive a reminder letter in the mail in about 4 months reminding you to call and schedule your appointment. If you don't receive this letter, please contact our office. 

## 2016-01-28 NOTE — Patient Instructions (Signed)
Please have stool studies done.  We have scheduled you for a barium enema in the near future. Depending on these results, we will decide the next step.  I have sent in a medication called Bentyl to take 3 times daily with meals and at bedtime.

## 2016-01-28 NOTE — Progress Notes (Signed)
Cardiology Office Note  Date: 01/28/2016   ID: [name], DOB 11/20/1945, MRN [id number]  PCP: Octavio Graves, DO  Primary Cardiologist: Rozann Lesches, MD   Chief Complaint  Patient presents with  . PSVT    History of Present Illness: Michelle Zavala is a 70 y.o. female last seen in January. She presents for a routine follow-up visit. States that over the last month she has been having abdominal discomfort, mainly complaining of postprandial diarrhea, also intermittent nausea and emesis. She states that she has been able to take her regular medications. During this time she has experienced more of a sense of palpitations, sometimes dizziness when she stands up. She has not had syncope. No reproducible exertional chest pain. She does have an atypical sounding left lower costal discomfort, has fibrotic changes by chest CT in this region. Of note, she did undergo a recent incisional hernia repair in March.  Cardiac monitoring done in January of this year showed sinus rhythm with rare PACs and PVCs, brief 4 beat burst of SVT. There were no sustained arrhythmias or pauses. She is on Toprol-XL 100 mg daily. I reviewed her ECG today which shows sinus rhythm with decreased R wave progression, low voltage in the precordial leads, and nonspecific T-wave changes.  I reviewed her chart and see that she did undergo thyroid imaging with biopsy earlier this year. 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  She states that she has a gastroenterology appointment pending later this afternoon.  Past Medical History  Diagnosis Date  . Coronary atherosclerosis of native coronary artery     Mild atherosclerosis 3/10, LVEF 60-65%  . Depression   . GERD (gastroesophageal reflux disease)   . Diverticulosis   . Anxiety   . Chronic lung disease     Fibrosis - Dr. Koleen Nimrod  . Essential hypertension, benign   . PSVT (paroxysmal supraventricular tachycardia) ([place])   . C2 cervical fracture  ([place]) 09/17/13    Traumatic fracture witth minimal displacement  . Arthritis   . COPD (chronic obstructive pulmonary disease) [facility])     Past Surgical History  Procedure Laterality Date  . Cholecystectomy    . Cesarean section    . Total knee arthroplasty Bilateral   . Anterior release vertebral body w/ posterior fusion    . Abdominal hysterectomy    . Tubal ligation    . Tmj arthroscopy Bilateral 02/04/2015    Procedure: BILATERAL TEMPOROMANDIBULAR JOINT (TMJ) ARTHROSCOPY MENISECTOMY WITH FAT GRAFT FROM ABDOMEN ;  Surgeon: Diona Browner, DDS;  Location: [place];  Service: Oral Surgery;  Laterality: Bilateral;  . Esophagogastroduodenoscopy (egd) with propofol N/A 08/04/2015    Dr. Rourk:abnormal gastric mucosa s/p biopsy. Reactive gastropathy. Negative H.pylori  . Biopsy N/A 08/04/2015    Procedure: BIOPSY;  Surgeon: Daneil Dolin, MD;  [facility];  Service: Endoscopy;  Laterality: N/A;  Gastric  . Incisional hernia repair N/A 12/03/2015    Procedure: [name] HERNIORRHAPHY WITH MESH;  Surgeon: Aviva Signs, MD;  [facility];  Service: General;  Laterality: N/A;  . Insertion of mesh  12/03/2015    Procedure: INSERTION OF MESH;  Surgeon: Aviva Signs, MD;  [facility];  Service: General;;    Current Outpatient Prescriptions  Medication Sig Dispense Refill  . acetaminophen (TYLENOL) 650 MG CR tablet Take 1,300 mg by mouth at bedtime. Reported on 09/29/2015    . amitriptyline (ELAVIL) 75 MG tablet Take 75 mg by mouth at bedtime.  6  .  Aspirin-Salicylamide-Caffeine (ARTHRITIS STRENGTH BC POWDER PO) Take 1 packet by mouth every morning.    . budesonide-formoterol (SYMBICORT) 160-4.5 MCG/ACT inhaler Inhale 2 puffs into the lungs 2 (two) times daily. 1 Inhaler 12  . docusate sodium (COLACE) 100 MG capsule Take 1 capsule (100 mg total) by mouth every 12 (twelve) hours. 30 capsule 0  . fluticasone (FLONASE) 50 MCG/ACT nasal spray Place 1 spray into the nose daily as needed for  allergies.     . hydrochlorothiazide (HYDRODIURIL) 25 MG tablet Take 1 tablet (25 mg total) by mouth daily. 30 tablet 1  . HYDROcodone-acetaminophen (NORCO) 10-325 MG tablet Take 0.5 tablets by mouth 2 (two) times daily as needed for moderate pain.   0  . ipratropium-albuterol (DUONEB) 0.5-2.5 (3) MG/3ML SOLN Take 3 mLs by nebulization 2 (two) times daily as needed (shortness of breath).   0  . LORazepam (ATIVAN) 2 MG tablet Take 2-4 mg by mouth See admin instructions. Take 2 mg by mouth in the morning, 2 mg at noon, and 4 mg at bedtime    . Melatonin 5 MG TABS Take 5 mg by mouth at bedtime.    . metoprolol succinate (TOPROL-XL) 100 MG 24 hr tablet TAKE ONE TABLET BY MOUTH DAILY. 30 tablet 6  . ondansetron (ZOFRAN ODT) 4 MG disintegrating tablet Take 1 tablet (4 mg total) by mouth every 8 (eight) hours as needed for nausea. 10 tablet 0  . pantoprazole (PROTONIX) 40 MG tablet Take 1 tablet (40 mg total) by mouth 2 (two) times daily. 60 tablet 1  . sertraline (ZOLOFT) 100 MG tablet Take 100 mg by mouth daily.      . Vitamin D, Ergocalciferol, (DRISDOL) 50000 UNITS CAPS capsule Take 50,000 Units by mouth 2 (two) times a week. Take on Wednesday and Saturday.     No current facility-administered medications for this visit.   Allergies:  Sulfonamide derivatives   Social History: The patient  reports that she has never smoked. She has never used smokeless tobacco. She reports that she does not drink alcohol or use illicit drugs.   ROS:  Please see the history of present illness. Otherwise, complete review of systems is positive for abdominal bloating. All other systems are reviewed and negative.   Physical Exam: VS:  BP 132/92 mmHg  Pulse 65  Ht 5\' 5"  (1.651 m)  Wt 209 lb (94.802 kg)  BMI 34.78 kg/m2  SpO2 95%, BMI Body mass index is 34.78 kg/(m^2).  Wt Readings from Last 3 Encounters:  01/28/16 209 lb (94.802 kg)  01/12/16 200 lb (90.719 kg)  11/28/15 232 lb (105.235 kg)    Obese woman,  appears comfortable at rest.  HEENT: Conjunctiva and lids normal, oropharynx with moist mucosa.  Neck: Supple, no elevated JVP or carotid bruits.  Lungs: Clear to auscultation, nonlabored.  Cardiac: Regular rate and rhythm, no S3 gallop or rub.  Abdomen: Obese, nontender, bowel sounds present.  Skin: Warm and dry.  Musculoskeletal: No kyphosis.  Extremities: No pitting edema, distal pulses full.  Neuropsychiatric: Alert and oriented x3, affect grossly normal.  ECG: I personally reviewed the prior tracing from 10/08/2015 which showed sinus rhythm with left anterior fascicular block.  Recent Labwork: 01/12/2016: ALT 18; AST 24; BUN 13; Creatinine, Ser 0.97; Hemoglobin 14.4; Platelets 240; Potassium 3.5; Sodium 140   Assessment and Plan:  1. History of PSVT. Plan to continue high-dose beta blocker therapy. Follow-up monitoring done earlier this year did not show any sustained events.  2. History of  mild coronary atherosclerosis based on previous cardiac catheterization. No definitive angina symptoms. Continue medical therapy and observation.  3. COPD and pulmonary fibrosis.  4. Abdominal bloating and discomfort, postprandial diarrhea, also intermittent nausea and emesis. She had an incisional hernia repair in March of this year. Present symptoms have been going on for at least a month. She has a visit scheduled with gastroenterology later this afternoon for further assessment.  Current medicines were reviewed with the patient today.   Orders Placed This Encounter  Procedures  . EKG 12-Lead    Disposition: FU with me in 6 months.   Signed, Satira Sark, MD, [facility] 01/28/2016 1:26 PM    [facility] [place], [place], [place] [address] Phone: [phone]; Fax: [phone]

## 2016-01-29 ENCOUNTER — Other Ambulatory Visit: Payer: Self-pay | Admitting: Gastroenterology

## 2016-01-30 ENCOUNTER — Ambulatory Visit (HOSPITAL_COMMUNITY)
Admission: RE | Admit: 2016-01-30 | Discharge: 2016-01-30 | Disposition: A | Discharge status: Home / Self Care | Payer: Medicare Other | Source: Ambulatory Visit | Attending: Gastroenterology | Admitting: Gastroenterology

## 2016-01-30 DIAGNOSIS — R197 Diarrhea, unspecified: Secondary | ICD-10-CM | POA: Diagnosis present

## 2016-01-30 DIAGNOSIS — K5730 Diverticulosis of large intestine without perforation or abscess without bleeding: Secondary | ICD-10-CM | POA: Insufficient documentation

## 2016-01-30 LAB — CLOSTRIDIUM DIFFICILE BY PCR: Toxigenic C. Difficile by PCR: NOT DETECTED

## 2016-02-02 LAB — STOOL CULTURE

## 2016-02-02 NOTE — Assessment & Plan Note (Signed)
Persistent abdominal pain, with thorough work-up to include EGD, recent hernia repair due to concerns for symptomatic ventral hernia, and CTA that noted SMA narrowing but celiac and IMA widely patent. Although she has pain with eating, she has not had any significant weight loss, and her symptoms do not appear consistent with mesenteric ischemia. Query underlying IBS-D, unable to exclude bile salt diarrhea component. Updated CT recently with a 10 cm length of large bowel narrowing in the region of the hepatic flexure, may be transient. No mass.   1. Check stool studies 2. Barium enema to assess abnormality seen on CT 3. Obtain colonoscopy reports (already requested at last visit but will request again) from Dr. Britta Mccreedy last year 4. Bentyl for supportive measures. Viberzi contraindicated. 5. Consider referral to IR for further evaluation of abdominal pain due to known SMA disease, although I doubt this is the culprit. Strongly question chronic abdominal pain and may benefit from tertiary referral if she is extensively evaluated here without etiology.

## 2016-02-02 NOTE — Progress Notes (Signed)
CC'D TO PCP °

## 2016-02-03 LAB — GIARDIA ANTIGEN

## 2016-02-05 ENCOUNTER — Telehealth: Payer: Self-pay | Admitting: Internal Medicine

## 2016-02-05 NOTE — Telephone Encounter (Signed)
Routing to AS 

## 2016-02-05 NOTE — Telephone Encounter (Signed)
Pt called to see if her lab results were available. Please call [phone]

## 2016-02-06 ENCOUNTER — Ambulatory Visit: Payer: Medicare Other | Admitting: Gastroenterology

## 2016-02-06 ENCOUNTER — Encounter: Payer: Self-pay | Admitting: Gastroenterology

## 2016-02-06 NOTE — Telephone Encounter (Signed)
Addendum:  Reviewed results.  Colonoscopy retrieved, no polyps. Surveillance in 2021.  Stool studies and barium enema normal.  Doubt chronic mesenteric ischemia. Let's order hydrogen breath test. If this positive, will treat for SIBO with either Xifaxan (if covered) or another agent. If negative, will refer to Vascular IR for further consideration of work-up due to atherosclerotic disease. May ultimately need [facility] referral.

## 2016-02-06 NOTE — Progress Notes (Signed)
Quick Note:  Good news, barium enema normal. See additional stool studies for further plan. ______

## 2016-02-06 NOTE — Progress Notes (Signed)
Colonoscopy retrieved, no polyps. Surveillance in 2021.  Stool studies and barium enema normal.  Doubt chronic mesenteric ischemia. Let's order hydrogen breath test. If this positive, will treat for SIBO with either Xifaxan (if covered) or another agent. If negative, will refer to Vascular IR for further consideration of work-up due to atherosclerotic disease. May ultimately need [facility] referral.

## 2016-02-06 NOTE — Telephone Encounter (Signed)
Tried to call Michelle Zavala with results. Asked her to call back if its ok to order HBT.

## 2016-02-06 NOTE — Telephone Encounter (Signed)
Please see results.

## 2016-02-10 ENCOUNTER — Telehealth: Payer: Self-pay | Admitting: Internal Medicine

## 2016-02-10 NOTE — Telephone Encounter (Signed)
Spoke with the pt, she said she did get my message from 02/06/16 and would like to be scheduled for HBT. Please see other phone note.

## 2016-02-10 NOTE — Telephone Encounter (Signed)
Please return call to patient at [phone]. She called asking to speak with AS about what we were going to do with her stomach. I seen where JL had LMOM on 5/26 for patient to call regarding her results.

## 2016-02-11 ENCOUNTER — Other Ambulatory Visit: Payer: Self-pay

## 2016-02-11 DIAGNOSIS — R1013 Epigastric pain: Secondary | ICD-10-CM

## 2016-02-11 DIAGNOSIS — R197 Diarrhea, unspecified: Secondary | ICD-10-CM

## 2016-02-11 NOTE — Telephone Encounter (Signed)
Called pt and LMOM to call office back to set up HBT

## 2016-02-11 NOTE — Telephone Encounter (Signed)
Pt is set for HBT on 06/08 @ 0700. Pt is aware and will come by office to pick up instructions

## 2016-02-12 ENCOUNTER — Ambulatory Visit: Payer: Medicare Other | Admitting: Gastroenterology

## 2016-02-19 ENCOUNTER — Ambulatory Visit (HOSPITAL_COMMUNITY)
Admission: RE | Admit: 2016-02-19 | Discharge: 2016-02-19 | Disposition: A | Discharge status: Home / Self Care | Payer: Medicare Other | Source: Ambulatory Visit | Attending: Internal Medicine | Admitting: Internal Medicine

## 2016-02-19 ENCOUNTER — Encounter (HOSPITAL_COMMUNITY): Payer: Self-pay | Admitting: *Deleted

## 2016-02-19 ENCOUNTER — Encounter (HOSPITAL_COMMUNITY)
Admission: RE | Disposition: A | Discharge status: Home / Self Care | Payer: Self-pay | Source: Ambulatory Visit | Attending: Internal Medicine

## 2016-02-19 DIAGNOSIS — R1013 Epigastric pain: Secondary | ICD-10-CM | POA: Diagnosis not present

## 2016-02-19 DIAGNOSIS — R197 Diarrhea, unspecified: Secondary | ICD-10-CM | POA: Insufficient documentation

## 2016-02-19 HISTORY — PX: BACTERIAL OVERGROWTH TEST: SHX5739

## 2016-02-19 SURGERY — BREATH TEST, FOR INTESTINAL BACTERIAL OVERGROWTH

## 2016-02-19 MED ORDER — SODIUM CHLORIDE 0.9 % IV SOLN: INTRAVENOUS | Status: DC

## 2016-02-19 MED ORDER — LACTULOSE 10 GM/15ML PO SOLN
ORAL | Status: AC
  Administered 2016-02-19: 30 g via ORAL
  Filled 2016-02-19: qty 60

## 2016-02-19 MED ORDER — LACTULOSE 10 GM/15ML PO SOLN
30.0000 g | Freq: Once | ORAL | Status: AC
  Administered 2016-02-19: 30 g via ORAL

## 2016-02-19 NOTE — Progress Notes (Addendum)
No beans, bran or high fiber cereal the day before the procedure? no NPO except for water 12 hours before procedure? yes No smoking, sleeping or vigorous exercising for at least 30 before procedure? no Recent antibiotic use and/or diarrhea? no   If yes, physician notified.  Time Baseline  30 mins 45 mins 60 mins 75 mins 90 mins 105 mins 120 mins 135 mins 150 mins 165 mins 180 mins  H2-ppm   4    6   5    5   7   2     2   3    3   2    3    3         Interpretation/Findings: Baseline 4 ppm, only peak 7 ppm (75 minutes). Without peak of 20+ ppm H2 before 120 min. Unlikely bacterial overgrowth.  Recommendations: Referall to IR for evaluation given atherosclerotic disease as previously recommended by Laban Emperor, NP at last office visit.    Walden Field, AGNP-C Adult & Gerontological Nurse Practitioner [facility]

## 2016-02-20 ENCOUNTER — Encounter (HOSPITAL_COMMUNITY): Payer: Self-pay | Admitting: Internal Medicine

## 2016-03-22 ENCOUNTER — Telehealth: Payer: Self-pay | Admitting: Internal Medicine

## 2016-03-22 NOTE — Telephone Encounter (Signed)
She does not likely have bacterial overgrowth. This was reviewed on 02/19/26, but maybe it wasn't routed to anyone. I apologize for any miscommunication.   She needs referral to IR for further evaluation of atherosclerotic disease. (known SMA disease but I strongly question chronic abdominal pain. May benefit from tertiary care referral).

## 2016-03-22 NOTE — Telephone Encounter (Signed)
Pt was calling about her results from her HBT and if she needed to make a follow up appointment. Please advise

## 2016-03-22 NOTE — Telephone Encounter (Signed)
Routing to AS 

## 2016-03-22 NOTE — Telephone Encounter (Signed)
, °

## 2016-03-23 NOTE — Telephone Encounter (Signed)
Pt is aware. Ok to refer to IR. Ginger, please refer pt.

## 2016-03-24 ENCOUNTER — Other Ambulatory Visit: Payer: Self-pay

## 2016-03-24 DIAGNOSIS — R109 Unspecified abdominal pain: Secondary | ICD-10-CM

## 2016-03-24 NOTE — Telephone Encounter (Signed)
Referral has been made.

## 2016-04-07 ENCOUNTER — Other Ambulatory Visit: Payer: Self-pay | Admitting: Cardiology

## 2016-06-23 ENCOUNTER — Emergency Department (HOSPITAL_COMMUNITY)
Admission: EM | Admit: 2016-06-23 | Discharge: 2016-06-23 | Disposition: A | Discharge status: Home / Self Care | Payer: Medicare Other | Attending: Emergency Medicine | Admitting: Emergency Medicine

## 2016-06-23 ENCOUNTER — Emergency Department (HOSPITAL_COMMUNITY): Payer: Medicare Other

## 2016-06-23 ENCOUNTER — Encounter (HOSPITAL_COMMUNITY): Payer: Self-pay | Admitting: Emergency Medicine

## 2016-06-23 DIAGNOSIS — I10 Essential (primary) hypertension: Secondary | ICD-10-CM | POA: Diagnosis not present

## 2016-06-23 DIAGNOSIS — R1013 Epigastric pain: Secondary | ICD-10-CM | POA: Diagnosis not present

## 2016-06-23 DIAGNOSIS — J449 Chronic obstructive pulmonary disease, unspecified: Secondary | ICD-10-CM | POA: Insufficient documentation

## 2016-06-23 DIAGNOSIS — I2510 Atherosclerotic heart disease of native coronary artery without angina pectoris: Secondary | ICD-10-CM | POA: Diagnosis not present

## 2016-06-23 DIAGNOSIS — R109 Unspecified abdominal pain: Secondary | ICD-10-CM

## 2016-06-23 DIAGNOSIS — Z79899 Other long term (current) drug therapy: Secondary | ICD-10-CM | POA: Diagnosis not present

## 2016-06-23 DIAGNOSIS — R42 Dizziness and giddiness: Secondary | ICD-10-CM | POA: Diagnosis present

## 2016-06-23 LAB — COMPREHENSIVE METABOLIC PANEL
ALT: 21 U/L (ref 14–54)
AST: 25 U/L (ref 15–41)
Albumin: 4.1 g/dL (ref 3.5–5.0)
Alkaline Phosphatase: 66 U/L (ref 38–126)
Anion gap: 8 (ref 5–15)
BUN: 20 mg/dL (ref 6–20)
CO2: 26 mmol/L (ref 22–32)
Calcium: 9.4 mg/dL (ref 8.9–10.3)
Chloride: 103 mmol/L (ref 101–111)
Creatinine, Ser: 1 mg/dL (ref 0.44–1.00)
GFR calc Af Amer: >60 mL/min (ref >60)
GFR calc non Af Amer: 56 mL/min — ABNORMAL LOW (ref >60)
Glucose, Bld: 105 mg/dL — ABNORMAL HIGH (ref 65–99)
Potassium: 3.3 mmol/L — ABNORMAL LOW (ref 3.5–5.1)
Sodium: 137 mmol/L (ref 135–145)
Total Bilirubin: 0.6 mg/dL (ref 0.3–1.2)
Total Protein: 7.2 g/dL (ref 6.5–8.1)

## 2016-06-23 LAB — CBC WITH DIFFERENTIAL/PLATELET
Basophils Absolute: 0 10*3/uL (ref 0.0–0.1)
Basophils Relative: 0 %
Eosinophils Absolute: 0.1 10*3/uL (ref 0.0–0.7)
Eosinophils Relative: 1 %
HCT: 38.6 % (ref 36.0–46.0)
Hemoglobin: 13.7 g/dL (ref 12.0–15.0)
Lymphocytes Relative: 32 %
Lymphs Abs: 2.9 10*3/uL (ref 0.7–4.0)
MCH: 31.6 pg (ref 26.0–34.0)
MCHC: 35.5 g/dL (ref 30.0–36.0)
MCV: 88.9 fL (ref 78.0–100.0)
Monocytes Absolute: 0.8 10*3/uL (ref 0.1–1.0)
Monocytes Relative: 9 %
Neutro Abs: 5 10*3/uL (ref 1.7–7.7)
Neutrophils Relative %: 58 %
Platelets: 243 10*3/uL (ref 150–400)
RBC: 4.34 MIL/uL (ref 3.87–5.11)
RDW: 13 % (ref 11.5–15.5)
WBC: 8.8 10*3/uL (ref 4.0–10.5)

## 2016-06-23 LAB — URINALYSIS, ROUTINE W REFLEX MICROSCOPIC
Bilirubin Urine: NEGATIVE
Glucose, UA: NEGATIVE mg/dL
Hgb urine dipstick: NEGATIVE
Ketones, ur: NEGATIVE mg/dL
Leukocytes, UA: NEGATIVE
Nitrite: NEGATIVE
Protein, ur: NEGATIVE mg/dL
Specific Gravity, Urine: 1.01 (ref 1.005–1.030)
pH: 5.5 (ref 5.0–8.0)

## 2016-06-23 LAB — TROPONIN I: Troponin I: <0.03 ng/mL (ref <0.03)

## 2016-06-23 LAB — LIPASE, BLOOD: Lipase: 35 U/L (ref 11–51)

## 2016-06-23 MED ORDER — HYDROMORPHONE HCL 1 MG/ML IJ SOLN
0.5000 mg | INTRAMUSCULAR | Status: DC | PRN
  Administered 2016-06-23: 0.5 mg via INTRAVENOUS
  Filled 2016-06-23: qty 1

## 2016-06-23 MED ORDER — HYDROMORPHONE HCL 1 MG/ML IJ SOLN: 0.5000 mg | INTRAMUSCULAR | Status: DC | PRN

## 2016-06-23 MED ORDER — SODIUM CHLORIDE 0.9 % IV SOLN
INTRAVENOUS | Status: DC
  Administered 2016-06-23: 16:00:00 via INTRAVENOUS

## 2016-06-23 MED ORDER — IOPAMIDOL (ISOVUE-300) INJECTION 61%
100.0000 mL | Freq: Once | INTRAVENOUS | Status: AC | PRN
  Administered 2016-06-23: 100 mL via INTRAVENOUS

## 2016-06-23 MED ORDER — ONDANSETRON HCL 4 MG/2ML IJ SOLN
4.0000 mg | Freq: Once | INTRAMUSCULAR | Status: AC
  Administered 2016-06-23: 4 mg via INTRAVENOUS
  Filled 2016-06-23: qty 2

## 2016-06-23 MED ORDER — IOPAMIDOL (ISOVUE-300) INJECTION 61%
INTRAVENOUS | Status: AC
  Filled 2016-06-23: qty 30

## 2016-06-23 MED ORDER — SODIUM CHLORIDE 0.9 % IV BOLUS (SEPSIS)
1000.0000 mL | Freq: Once | INTRAVENOUS | Status: AC
  Administered 2016-06-23: 1000 mL via INTRAVENOUS
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
## 2016-06-23 NOTE — Discharge Instructions (Signed)
Continue with her current medications, follow up with her primary care doctor,

## 2016-06-23 NOTE — ED Notes (Signed)
Patient transported to CT 

## 2016-06-23 NOTE — ED Triage Notes (Signed)
Pt reports dizziness x2 weeks, n/v/d and palpitations for last several days. Pt reports was seen for same and given medication for dizziness with minimal relief.

## 2016-06-30 ENCOUNTER — Encounter: Payer: Self-pay | Admitting: Gastroenterology

## 2016-06-30 ENCOUNTER — Other Ambulatory Visit: Payer: Self-pay

## 2016-06-30 ENCOUNTER — Ambulatory Visit (INDEPENDENT_AMBULATORY_CARE_PROVIDER_SITE_OTHER): Payer: Medicare Other | Admitting: Gastroenterology

## 2016-06-30 ENCOUNTER — Telehealth: Payer: Self-pay

## 2016-06-30 VITALS — BP 112/80 | HR 78 | Temp 98.3°F | Ht 65.0 in | Wt 210.2 lb

## 2016-06-30 DIAGNOSIS — K529 Noninfective gastroenteritis and colitis, unspecified: Secondary | ICD-10-CM

## 2016-06-30 DIAGNOSIS — R197 Diarrhea, unspecified: Secondary | ICD-10-CM

## 2016-06-30 MED ORDER — NA SULFATE-K SULFATE-MG SULF 17.5-3.13-1.6 GM/177ML PO SOLN: 1.0000 | ORAL | 0 refills | Status: DC

## 2016-06-30 NOTE — Telephone Encounter (Signed)
LMOM and informed of pre-op appt 07/19/16 at 11:00am. Letter mailed.

## 2016-06-30 NOTE — Progress Notes (Signed)
cc'ed to pcp °

## 2016-06-30 NOTE — Patient Instructions (Signed)
Please complete the stool studies.   We have scheduled you for the colonoscopy with Dr. Gala Romney in the near future.

## 2016-06-30 NOTE — Assessment & Plan Note (Signed)
71 year old female with worsening diarrhea, now with new onset fecal incontinence. She is well known to our office and seen multiple times with multiple CTs in the ED for chronic abdominal pain. As of note, mesenteric ischemia ruled out ( CTA with origin of SMA up to 50% narrowed but celiac and IMA widely patent. Progression of SMA disease could cause issues, but clinical monitoring recommended. She was seen by Vascular Surgery due to renal artery disease. Medical therapy recommended). We have followed closely for abdominal pain and her weight has remained stable. She is now presenting with persistent, worsening diarrhea affecting her quality of life. It is notable she had a colonoscopy over a year ago by Dr. Britta Mccreedy, but no colonic biopsies or evaluation of terminal ileum was done at that time. I doubt she has an infectious process, but I have ordered stool studies to rule this out and arranged as well for a colonoscopy in the near future with random colonic biopsies to wrap up the GI evaluation. She has been thoroughly evaluated from our standpoint and may benefit from tertiary referral if colonoscopy is unrevealing (see HPI for full work-up thus far).  Check stool studies Proceed with TCS with Dr. Gala Romney in near future: the risks, benefits, and alternatives have been discussed with the patient in detail. The patient states understanding and desires to proceed. PROPOFOL due to polypharmacy

## 2016-06-30 NOTE — Progress Notes (Signed)
Referring Provider: Octavio Graves, DO Primary Care Physician:  Octavio Graves, DO  Primary GI: Dr. Gala Romney   Chief Complaint  Patient presents with  . Diarrhea    HPI:   Michelle Zavala is a 70 y.o. female presenting today with a history of chronic epigastric pain, s/p EGD with only reactive gastropathy found. Negative H.pylori. 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 Progression of SMA disease could cause issues, but clinical monitoring recommended. She was seen by Vascular Surgery due to renal artery disease. Medical therapy recommended. Symptomatic fat-containing ventral hernia noted at last visit in March 2017, undergoing hernia repair in March 2017. No weight loss. Chronic diarrhea. Negative hydrogen breath test for small bowel bacterial overgrowth.   CT 01/12/16 showed a 10 cm length of large bowel narrowing in the region of the hepatic flexure, may be transient. No mass. Single contrast barium enema could be considered to confirm the absence of a fixed structural lesion. Barium enema was normal. Last colonoscopy was by Dr. Britta Mccreedy in 2016 and due again in 5 years. She has had another CT as of Oct 2017 without any acute findings.   Feels dizzy. Waking up in stool. Fecal incontinence. Having "squirts" throughout the day. Now with new onset fecal incontinence. No rectal bleeding. Diarrhea was present at time of colonoscopy even with Dr. Britta Mccreedy but no biopsies done at that time of colonoscopy. Diarrhea worsened over the past 3 weeks. Has history of chronic diarrhea. Has never had it "like this", like it's been in past three weeks. "stinks". No recent antibiotics. No sick contacts. Took a box of Imodium without help. Has had no improvement with Bentyl. Unable to take Viberzi as gallbladder absent.     Past Medical History:  Diagnosis Date  . Anxiety   . Arthritis   . C2 cervical fracture ([place]) 09/17/13   Traumatic fracture witth minimal displacement  .  Chronic lung disease    Fibrosis - Dr. Koleen Nimrod  . COPD (chronic obstructive pulmonary disease) ([place])   . Coronary atherosclerosis of native coronary artery    Mild atherosclerosis 3/10, LVEF 60-65%  . Depression   . Diverticulosis   . Essential hypertension, benign   . GERD (gastroesophageal reflux disease)   . PSVT (paroxysmal supraventricular tachycardia) ([place])     Past Surgical History:  Procedure Laterality Date  . ABDOMINAL HYSTERECTOMY    . ANTERIOR RELEASE VERTEBRAL BODY W/ POSTERIOR FUSION    . BACTERIAL OVERGROWTH TEST N/A 02/19/2016   Procedure: BACTERIAL OVERGROWTH TEST;  Surgeon: Daneil Dolin, MD;  [facility];  Service: Endoscopy;  Laterality: N/A;  0700  . BIOPSY N/A 08/04/2015   Procedure: BIOPSY;  Surgeon: Daneil Dolin, MD;  Location: [facility];  Service: Endoscopy;  Laterality: N/A;  Gastric  . CESAREAN SECTION    . CHOLECYSTECTOMY    . COLONOSCOPY  June 2016   Dr. Britta Mccreedy: moderate diverticulosis in sigmoid colon, surveillance in 5 years   . ESOPHAGOGASTRODUODENOSCOPY (EGD) WITH PROPOFOL N/A 08/04/2015   Dr. Rourk:abnormal gastric mucosa s/p biopsy. Reactive gastropathy. Negative H.pylori  . INCISIONAL HERNIA REPAIR N/A 12/03/2015   Procedure: [name] HERNIORRHAPHY WITH MESH;  Surgeon: Aviva Signs, MD;  Location: [facility];  Service: General;  Laterality: N/A;  . INSERTION OF MESH  12/03/2015   Procedure: INSERTION OF MESH;  Surgeon: Aviva Signs, MD;  Location: [facility];  Service: General;;  . TMJ ARTHROSCOPY Bilateral 02/04/2015   Procedure:  BILATERAL TEMPOROMANDIBULAR JOINT (TMJ) ARTHROSCOPY MENISECTOMY WITH FAT GRAFT FROM ABDOMEN ;  Surgeon: Diona Browner, DDS;  Location: [facility];  Service: Oral Surgery;  Laterality: Bilateral;  . TOTAL KNEE ARTHROPLASTY Bilateral   . TUBAL LIGATION      Current Outpatient Prescriptions  Medication Sig Dispense Refill  . acetaminophen (TYLENOL) 650 MG CR tablet Take 1,300 mg by mouth daily as needed for pain. Reported  on 09/29/2015    . alfuzosin (UROXATRAL) 10 MG 24 hr tablet Take 10 mg by mouth daily with breakfast.     . amitriptyline (ELAVIL) 75 MG tablet Take 75 mg by mouth at bedtime.  6  . Aspirin-Salicylamide-Caffeine (ARTHRITIS STRENGTH BC POWDER PO) Take 1 packet by mouth daily as needed (pain).     . budesonide-formoterol (SYMBICORT) 160-4.5 MCG/ACT inhaler Inhale 2 puffs into the lungs 2 (two) times daily. 1 Inhaler 12  . fluticasone (FLONASE) 50 MCG/ACT nasal spray Place 1 spray into the nose daily as needed for allergies.     . hydrochlorothiazide (HYDRODIURIL) 25 MG tablet Take 25 mg by mouth daily.    [name] ipratropium-albuterol (DUONEB) 0.5-2.5 (3) MG/3ML SOLN Take 3 mLs by nebulization 2 (two) times daily as needed (shortness of breath).   0  . LORazepam (ATIVAN) 2 MG tablet Take 2-4 mg by mouth See admin instructions. Take 2 mg by mouth in the morning, 2 mg at noon, and 4 mg at bedtime    . meclizine (ANTIVERT) 25 MG tablet Take 25 mg by mouth 3 (three) times daily as needed for dizziness.    . Melatonin 5 MG TABS Take 5 mg by mouth at bedtime.    . metoprolol succinate (TOPROL-XL) 100 MG 24 hr tablet TAKE ONE TABLET BY MOUTH DAILY. 30 tablet 6  . Multiple Vitamins-Minerals (HAIR SKIN NAILS PO) Take 1 tablet by mouth daily.    . ondansetron (ZOFRAN ODT) 4 MG disintegrating tablet Take 1 tablet (4 mg total) by mouth every 8 (eight) hours as needed for nausea. 10 tablet 0  . pantoprazole (PROTONIX) 40 MG tablet Take 1 tablet (40 mg total) by mouth 2 (two) times daily. 60 tablet 1  . sertraline (ZOLOFT) 100 MG tablet Take 100 mg by mouth daily.      . Vitamin D, Ergocalciferol, (DRISDOL) 50000 UNITS CAPS capsule Take 50,000 Units by mouth every 7 (seven) days. Take on Wednesday and Saturday.     No current facility-administered medications for this visit.     Allergies as of 06/30/2016 - Review Complete 06/30/2016  Allergen Reaction Noted  . Sulfonamide derivatives Hives     Family History    Problem Relation Age of Onset  . Coronary artery disease Father     Premature  . Heart disease Father   . Coronary artery disease Brother     Premature  . Coronary artery disease Sister     Premature  . Colon cancer Neg Hx     Social History   Social History  . Marital status: Widowed    Spouse name: N/A  . Number of children: 3  . Years of education: 7   Occupational History  . Retired    Social History Main Topics  . Smoking status: Never Smoker  . Smokeless tobacco: Never Used  . Alcohol use No  . Drug use: No  . Sexual activity: No   Other Topics Concern  . None   Social History Narrative   Occasionally drinks [employer]     Review of  Systems: As mentioned in HPI   Physical Exam: BP 112/80   Pulse 78   Temp 98.3 F (36.8 C) (Oral)   Ht 5\' 5"  (1.651 m)   Wt 210 lb 3.2 oz (95.3 kg)   BMI 34.98 kg/m  General:   Alert and oriented. No distress noted. Pleasant and cooperative.  Head:  Normocephalic and atraumatic. Eyes:  Conjuctiva clear without scleral icterus. Mouth:  Oral mucosa pink and moist. Good dentition. No lesions. Heart:  S1, S2 present without murmurs, Abdomen:  +BS, soft, mild TTP epigastric and non-distended. No rebound or guarding. No HSM or masses noted. Msk:  Symmetrical without gross deformities. Normal posture. Extremities:  Without edema. Neurologic:  Alert and  oriented x4;  grossly normal neurologically. Psych:  Alert and cooperative. Normal mood and affect.  Lab Results  Component Value Date   WBC 8.8 06/23/2016   HGB 13.7 06/23/2016   HCT 38.6 06/23/2016   MCV 88.9 06/23/2016   PLT 243 06/23/2016   Lab Results  Component Value Date   ALT 21 06/23/2016   AST 25 06/23/2016   ALKPHOS 66 06/23/2016   BILITOT 0.6 06/23/2016   Lab Results  Component Value Date   CREATININE 1.00 06/23/2016   BUN 20 06/23/2016   NA 137 06/23/2016   K 3.3 (L) 06/23/2016   CL 103 06/23/2016   CO2 26 06/23/2016

## 2016-07-05 ENCOUNTER — Other Ambulatory Visit: Payer: Self-pay | Admitting: Gastroenterology

## 2016-07-05 ENCOUNTER — Telehealth (INDEPENDENT_AMBULATORY_CARE_PROVIDER_SITE_OTHER): Payer: Self-pay | Admitting: Radiology

## 2016-07-05 NOTE — Telephone Encounter (Signed)
Pt is calling to see if she can get a referral to pain management.

## 2016-07-06 LAB — CLOSTRIDIUM DIFFICILE BY PCR: Toxigenic C. Difficile by PCR: NOT DETECTED

## 2016-07-06 NOTE — Telephone Encounter (Signed)
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 Lorin Mercy wrote her last script for pain medication. I called patient to advise a referral for pain management had already been entered into the system [facility]) and faxed to [facility]. I also advised patient that sometimes it took a while for patient to get accepted and get called for an appointment. She would like to know if Dr. Louanne Skye could write her enough Hydrocodone 10/325 to last her until she can be seen with pain mgmt.

## 2016-07-07 NOTE — Telephone Encounter (Signed)
See msg below. Patient requesting refill on hydrocodone Please advise.

## 2016-07-08 LAB — GIARDIA ANTIGEN

## 2016-07-08 NOTE — Progress Notes (Signed)
Cdiff, Giardia negative. Stool culture remains pending. Keep appt for colonoscopy.

## 2016-07-09 LAB — STOOL CULTURE

## 2016-07-15 ENCOUNTER — Telehealth (INDEPENDENT_AMBULATORY_CARE_PROVIDER_SITE_OTHER): Payer: Self-pay | Admitting: Specialist

## 2016-07-15 NOTE — Telephone Encounter (Signed)
Please advise on refill.

## 2016-07-16 NOTE — Patient Instructions (Signed)
Michelle Zavala  07/16/2016     @PREFPERIOPPHARMACY @   Your procedure is scheduled on  07/22/2016 .  Report to [facility] at  745   A.M.  Call this number if you have problems the morning of surgery:  [phone]   Remember:  Do not eat food or drink liquids after midnight.  Take these medicines the morning of surgery with A SIP OF WATER  Ativan, antivert, metoprolol, zofran. Take your nebuliaer before you come. Bring your rescue inhaler before you come.   Do not wear jewelry, make-up or nail polish.  Do not wear lotions, powders, or perfumes, or deoderant.  Do not shave 48 hours prior to surgery.  Men may shave face and neck.  Do not bring valuables to the hospital.  [facility] is not responsible for any belongings or valuables.  Contacts, dentures or bridgework may not be worn into surgery.  Leave your suitcase in the car.  After surgery it may be brought to your room.  For patients admitted to the hospital, discharge time will be determined by your treatment team.  Patients discharged the day of surgery will not be allowed to drive home.   Name and phone number of your driver:   family Special instructions:  Follow the diet and prep instructions given to you by Dr Roseanne Kaufman office.  Please read over the following fact sheets that you were given. Surgical Site Infection Prevention and Anesthesia Post-op Instructions       Colonoscopy A colonoscopy is an exam to look at the entire large intestine (colon). This exam can help find problems such as tumors, polyps, inflammation, and areas of bleeding. The exam takes about 1 hour.  LET [facility] CARE PROVIDER KNOW ABOUT:   Any allergies you have.  All medicines you are taking, including vitamins, herbs, eye drops, creams, and over-the-counter medicines.  Previous problems you or members of your family have had with the use of anesthetics.  Any blood disorders you have.  Previous surgeries you have  had.  Medical conditions you have. RISKS AND COMPLICATIONS  Generally, this is a safe procedure. However, as with any procedure, complications can occur. Possible complications include:  Bleeding.  Tearing or rupture of the colon wall.  Reaction to medicines given during the exam.  Infection (rare). BEFORE THE PROCEDURE   Ask your health care provider about changing or stopping your regular medicines.  You may be prescribed an oral bowel prep. This involves drinking a large amount of medicated liquid, starting the day before your procedure. The liquid will cause you to have multiple loose stools until your stool is almost clear or light green. This cleans out your colon in preparation for the procedure.  Do not eat or drink anything else once you have started the bowel prep, unless your health care provider tells you it is safe to do so.  Arrange for someone to drive you home after the procedure. PROCEDURE   You will be given medicine to help you relax (sedative).  You will lie on your side with your knees bent.  A long, flexible tube with a light and camera on the end (colonoscope) will be inserted through the rectum and into the colon. The camera sends video back to a computer screen as it moves through the colon. The colonoscope also releases carbon dioxide gas to inflate the colon. This helps your health care provider see the area better.  During the exam, your health care provider may take a small tissue sample (biopsy) to be examined under a microscope if any abnormalities are found.  The exam is finished when the entire colon has been viewed. AFTER THE PROCEDURE   Do not drive for 24 hours after the exam.  You may have a small amount of blood in your stool.  You may pass moderate amounts of gas and have mild abdominal cramping or bloating. This is caused by the gas used to inflate your colon during the exam.  Ask when your test results will be ready and how you will  get your results. Make sure you get your test results.   This information is not intended to replace advice given to you by your health care provider. Make sure you discuss any questions you have with your health care provider.   Document Released: 08/27/2000 Document Revised: 06/20/2013 Document Reviewed: 05/07/2013 Elsevier Interactive Patient Education 2016 Elsevier [employer] Colonoscopy, Care After Refer to this sheet in the next few weeks. These instructions provide you with information on caring for yourself after your procedure. Your health care provider may also give you more specific instructions. Your treatment has been planned according to current medical practices, but problems sometimes occur. Call your health care provider if you have any problems or questions after your procedure. WHAT TO EXPECT AFTER THE PROCEDURE  After your procedure, it is typical to have the following:  A small amount of blood in your stool.  Moderate amounts of gas and mild abdominal cramping or bloating. HOME CARE INSTRUCTIONS  Do not drive, operate machinery, or sign important documents for 24 hours.  You may shower and resume your regular physical activities, but move at a slower pace for the first 24 hours.  Take frequent rest periods for the first 24 hours.  Walk around or put a warm pack on your abdomen to help reduce abdominal cramping and bloating.  Drink enough fluids to keep your urine clear or pale yellow.  You may resume your normal diet as instructed by your health care provider. Avoid heavy or fried foods that are hard to digest.  Avoid drinking alcohol for 24 hours or as instructed by your health care provider.  Only take over-the-counter or prescription medicines as directed by your health care provider.  If a tissue sample (biopsy) was taken during your procedure:  Do not take aspirin or blood thinners for 7 days, or as instructed by your health care provider.  Do not drink  alcohol for 7 days, or as instructed by your health care provider.  Eat soft foods for the first 24 hours. SEEK MEDICAL CARE IF: You have persistent spotting of blood in your stool 2-3 days after the procedure. SEEK IMMEDIATE MEDICAL CARE IF:  You have more than a small spotting of blood in your stool.  You pass large blood clots in your stool.  Your abdomen is swollen (distended).  You have nausea or vomiting.  You have a fever.  You have increasing abdominal pain that is not relieved with medicine.   This information is not intended to replace advice given to you by your health care provider. Make sure you discuss any questions you have with your health care provider.   Document Released: 04/13/2004 Document Revised: 06/20/2013 Document Reviewed: 05/07/2013 Elsevier Interactive Patient Education 2016 [place] Monitored anesthesia care is an anesthesia service for a medical procedure. Anesthesia is the loss of the ability to feel pain.  It is produced by medicines called anesthetics. It may affect a small area of your body (local anesthesia), a large area of your body (regional anesthesia), or your entire body (general anesthesia). The need for monitored anesthesia care depends your procedure, your condition, and the potential need for regional or general anesthesia. It is often provided during procedures where:   General anesthesia may be needed if there are complications. This is because you need special care when you are under general anesthesia.   You will be under local or regional anesthesia. This is so that you are able to have higher levels of anesthesia if needed.   You will receive calming medicines (sedatives). This is especially the case if sedatives are given to put you in a semi-conscious state of relaxation (deep sedation). This is because the amount of sedative needed to produce this state can be hard to predict. Too much of a sedative  can produce general anesthesia. Monitored anesthesia care is performed by one or more health care providers who have special training in all types of anesthesia. You will need to meet with these health care providers before your procedure. During this meeting, they will ask you about your medical history. They will also give you instructions to follow. (For example, you will need to stop eating and drinking before your procedure. You may also need to stop or change medicines you are taking.) During your procedure, your health care providers will stay with you. They will:   Watch your condition. This includes watching your blood pressure, breathing, and level of pain.   Diagnose and treat problems that occur.   Give medicines if they are needed. These may include calming medicines (sedatives) and anesthetics.   Make sure you are comfortable.  Having monitored anesthesia care does not necessarily mean that you will be under anesthesia. It does mean that your health care providers will be able to manage anesthesia if you need it or if it occurs. It also means that you will be able to have a different type of anesthesia than you are having if you need it. When your procedure is complete, your health care providers will continue to watch your condition. They will make sure any medicines wear off before you are allowed to go home.    This information is not intended to replace advice given to you by your health care provider. Make sure you discuss any questions you have with your health care provider.   Document Released: 05/26/2005 Document Revised: 09/20/2014 Document Reviewed: 10/11/2012 Elsevier Interactive Patient Education 2016 Elsevier Inc. PATIENT INSTRUCTIONS POST-ANESTHESIA  IMMEDIATELY FOLLOWING SURGERY:  Do not drive or operate machinery for the first twenty four hours after surgery.  Do not make any important decisions for twenty four hours after surgery or while taking narcotic pain  medications or sedatives.  If you develop intractable nausea and vomiting or a severe headache please notify your doctor immediately.  FOLLOW-UP:  Please make an appointment with your surgeon as instructed. You do not need to follow up with anesthesia unless specifically instructed to do so.  WOUND CARE INSTRUCTIONS (if applicable):  Keep a dry clean dressing on the anesthesia/puncture wound site if there is drainage.  Once the wound has quit draining you may leave it open to air.  Generally you should leave the bandage intact for twenty four hours unless there is drainage.  If the epidural site drains for more than 36-48 hours please call the anesthesia department.  QUESTIONS?:  Please  feel free to call your physician or the hospital operator if you have any questions, and they will be happy to assist you.

## 2016-07-19 ENCOUNTER — Encounter (HOSPITAL_COMMUNITY)
Admission: RE | Admit: 2016-07-19 | Discharge: 2016-07-19 | Disposition: A | Discharge status: Home / Self Care | Payer: Medicare Other | Source: Ambulatory Visit | Attending: Internal Medicine | Admitting: Internal Medicine

## 2016-07-19 ENCOUNTER — Encounter (HOSPITAL_COMMUNITY): Payer: Self-pay

## 2016-07-19 ENCOUNTER — Telehealth: Payer: Self-pay

## 2016-07-19 NOTE — Telephone Encounter (Signed)
Michelle Zavala was last seen and prescribed medicine for pain she is in a pain management program at [facility] however that pain management program is only performing injection treatments of her neck and does not wish to take over prescribing narcotic pain medicine for her chronic pain disorder. Was referred by Dr. Lorin Mercy for my attention a year ago and has not required any surgical intervention since then. She was treated with bracing and then injection treatments for complaints of persisting cervicalgia due to a right-sided C1-C2 facet fracture with chronic spondylosis and right-sided cervical headaches and occipital neuralgia. She also has a burst fracture that is been treated without surgery. At this time she requires only medications for pain and surgical options are not being considered. She had seen Dr. Lorin Mercy at her  last visitin her home town, he recommended for pain management and she is now calling requesting further narcotic medicines. I believe that she does not require any surgical intervention and pain management does appear to be the appropriate long-term treatment. She is in a pain management program and does not wish to treat her with narcotic medicines so that I believe that we probably should not treat her with narcotic medicines either if pain management program does not feel as though it is something they believe it is necessary. Presently we are trying to get her into the [facility] Program.

## 2016-07-19 NOTE — Telephone Encounter (Signed)
Pt left voicemail message at office and requested to cancel colonoscopy. Stated she in not able to have it now. Scheduler at [facility] informed. Cancelled per pt request.

## 2016-07-20 ENCOUNTER — Ambulatory Visit (INDEPENDENT_AMBULATORY_CARE_PROVIDER_SITE_OTHER): Payer: Medicare Other | Admitting: Cardiology

## 2016-07-20 ENCOUNTER — Encounter: Payer: Self-pay | Admitting: Cardiology

## 2016-07-20 VITALS — BP 111/78 | HR 80 | Ht 65.0 in | Wt 214.0 lb

## 2016-07-20 DIAGNOSIS — R0602 Shortness of breath: Secondary | ICD-10-CM

## 2016-07-20 DIAGNOSIS — I10 Essential (primary) hypertension: Secondary | ICD-10-CM

## 2016-07-20 DIAGNOSIS — I2510 Atherosclerotic heart disease of native coronary artery without angina pectoris: Secondary | ICD-10-CM

## 2016-07-20 DIAGNOSIS — R002 Palpitations: Secondary | ICD-10-CM

## 2016-07-20 NOTE — Progress Notes (Signed)
Cardiology Office Note  Date: 07/20/2016   ID: Michelle Zavala, DOB [birth year], MRN [id number]  PCP: Octavio Graves, DO  Primary Cardiologist: Rozann Lesches, MD   Chief Complaint  Patient presents with  . Palpitations    History of Present Illness: Michelle Zavala is a 70 y.o. female last seen in May. She called to schedule a visit today describing a feeling of palpitations and jitteriness that has been going on for the last 3 weeks, also leg pain. No chest discomfort or leg swelling. She has been undergoing recent evaluation for abdominal pain, was seen in the ER in October with CT imaging as outlined below. It sounds like she was also supposed to have a colonoscopy but decided to cancel it.  I reviewed her ECG today in the presence of described palpitations, showed sinus rhythm with PAC, left anterior fascicular block.  She has not seen Dr. Melina Copa with her symptoms as yet. Just feels weak, sometimes "cold." No obvious fevers or chills. I reviewed her recent lab work from [place] visit.   Past Medical History:  Diagnosis Date  . Anxiety   . Arthritis   . C2 cervical fracture ([place]) 09/17/13   Traumatic fracture witth minimal displacement  . Chronic lung disease    Fibrosis - Dr. Koleen Nimrod  . COPD (chronic obstructive pulmonary disease) ([place])   . Coronary atherosclerosis of native coronary artery    Mild atherosclerosis 3/10, LVEF 60-65%  . Depression   . Diverticulosis   . Essential hypertension, benign   . GERD (gastroesophageal reflux disease)   . PSVT (paroxysmal supraventricular tachycardia) ([place])     Past Surgical History:  Procedure Laterality Date  . ABDOMINAL HYSTERECTOMY    . ANTERIOR RELEASE VERTEBRAL BODY W/ POSTERIOR FUSION    . BACTERIAL OVERGROWTH TEST N/A 02/19/2016   Procedure: BACTERIAL OVERGROWTH TEST;  Surgeon: Daneil Dolin, MD;  Location: [facility];  Service: Endoscopy;  Laterality: N/A;  0700  . BIOPSY N/A 08/04/2015   Procedure: BIOPSY;  Surgeon:  Daneil Dolin, MD;  Location: [facility] ORS;  Service: Endoscopy;  Laterality: N/A;  Gastric  . CESAREAN SECTION    . CHOLECYSTECTOMY    . COLONOSCOPY  June 2016   Dr. Britta Mccreedy: moderate diverticulosis in sigmoid colon, surveillance in 5 years   . ESOPHAGOGASTRODUODENOSCOPY (EGD) WITH PROPOFOL N/A 08/04/2015   Dr. Rourk:abnormal gastric mucosa s/p biopsy. Reactive gastropathy. Negative H.pylori  . INCISIONAL HERNIA REPAIR N/A 12/03/2015   Procedure: [name] HERNIORRHAPHY WITH MESH;  Surgeon: Aviva Signs, MD;  Location: [facility] ORS;  Service: General;  Laterality: N/A;  . INSERTION OF MESH  12/03/2015   Procedure: INSERTION OF MESH;  Surgeon: Aviva Signs, MD;  Location: [facility] ORS;  Service: General;;  . TMJ ARTHROSCOPY Bilateral 02/04/2015   Procedure: BILATERAL TEMPOROMANDIBULAR JOINT (TMJ) ARTHROSCOPY MENISECTOMY WITH FAT GRAFT FROM ABDOMEN ;  Surgeon: Diona Browner, DDS;  Location: [place];  Service: Oral Surgery;  Laterality: Bilateral;  . TOTAL KNEE ARTHROPLASTY Bilateral   . TUBAL LIGATION      Current Outpatient Prescriptions  Medication Sig Dispense Refill  . acetaminophen (TYLENOL) 650 MG CR tablet Take 1,300 mg by mouth daily as needed for pain. Reported on 09/29/2015    . alfuzosin (UROXATRAL) 10 MG 24 hr tablet Take 10 mg by mouth daily with breakfast.     . amitriptyline (ELAVIL) 75 MG tablet Take 75 mg by mouth at bedtime.  6  . Aspirin-Salicylamide-Caffeine (ARTHRITIS STRENGTH BC POWDER PO) Take 1  packet by mouth daily as needed (pain).     . budesonide-formoterol (SYMBICORT) 160-4.5 MCG/ACT inhaler Inhale 2 puffs into the lungs 2 (two) times daily. 1 Inhaler 12  . fluticasone (FLONASE) 50 MCG/ACT nasal spray Place 1 spray into the nose daily as needed for allergies.     . hydrochlorothiazide (HYDRODIURIL) 25 MG tablet Take 25 mg by mouth daily.    [name] ipratropium-albuterol (DUONEB) 0.5-2.5 (3) MG/3ML SOLN Take 3 mLs by nebulization 2 (two) times daily as needed (shortness of breath).   0  .  LORazepam (ATIVAN) 2 MG tablet Take 2-4 mg by mouth See admin instructions. Take 2 mg by mouth in the morning, 2 mg at noon, and 4 mg at bedtime    . meclizine (ANTIVERT) 25 MG tablet Take 25 mg by mouth 3 (three) times daily as needed for dizziness.    . Melatonin 5 MG TABS Take 5 mg by mouth at bedtime.    . metoprolol succinate (TOPROL-XL) 100 MG 24 hr tablet TAKE ONE TABLET BY MOUTH DAILY. 30 tablet 6  . Multiple Vitamins-Minerals (HAIR SKIN NAILS PO) Take 1 tablet by mouth daily.    . Na Sulfate-K Sulfate-Mg Sulf (SUPREP BOWEL PREP KIT) 17.5-3.13-1.6 GM/180ML SOLN Take 1 kit by mouth as directed. 1 Bottle 0  . ondansetron (ZOFRAN ODT) 4 MG disintegrating tablet Take 1 tablet (4 mg total) by mouth every 8 (eight) hours as needed for nausea. 10 tablet 0  . pantoprazole (PROTONIX) 40 MG tablet Take 1 tablet (40 mg total) by mouth 2 (two) times daily. 60 tablet 1  . sertraline (ZOLOFT) 100 MG tablet Take 100 mg by mouth daily.      . Vitamin D, Ergocalciferol, (DRISDOL) 50000 UNITS CAPS capsule Take 50,000 Units by mouth every 7 (seven) days. Take on Wednesday and Saturday.     No current facility-administered medications for this visit.    Allergies:  Sulfonamide derivatives   Social History: The patient  reports that she has never smoked. She has never used smokeless tobacco. She reports that she does not drink alcohol or use drugs.   ROS:  Please see the history of present illness. Otherwise, complete review of systems is positive for none.  All other systems are reviewed and negative.   Physical Exam: VS:  BP 111/78   Pulse 80   Ht 5' 5"  (1.651 m)   Wt 214 lb (97.1 kg)   SpO2 95%   BMI 35.61 kg/m , BMI Body mass index is 35.61 kg/m.  Wt Readings from Last 3 Encounters:  07/20/16 214 lb (97.1 kg)  06/30/16 210 lb 3.2 oz (95.3 kg)  06/23/16 208 lb (94.3 kg)    Obese woman, appears comfortable at rest.  HEENT: Conjunctiva and lids normal, oropharynx with moist mucosa.  Neck:  Supple, no elevated JVP or carotid bruits.  Lungs: Clear to auscultation, nonlabored.  Cardiac: Regular rate and rhythm, no S3 gallop or rub.  Abdomen: Obese, nontender, bowel sounds present.  Skin: Warm and dry.  Musculoskeletal: No kyphosis.  Extremities: No pitting edema, distal pulses full.  Neuropsychiatric: Alert and oriented x3, affect grossly normal.  ECG: I personally reviewed the tracing from 01/28/2016 which showed sinus rhythm with leftward axis, poor R progression, nonspecific T-wave changes.   Recent Labwork: 06/23/2016: ALT 21; AST 25; BUN 20; Creatinine, Ser 1.00; Hemoglobin 13.7; Platelets 243; Potassium 3.3; Sodium 137   Other Studies Reviewed Today:  Abdominal and pelvic CT 06/23/2016: MPRESSION: No acute finding by CT.  Sigmoid diverticulosis without evidence of diverticulitis.  Aortic atherosclerosis.  Markedly tortuous aorta.  Previous cholecystectomy and hysterectomy.  Areas of focal atrophy affecting the right kidney.  Old compression fracture at T12.  Assessment and Plan:  1. Vague feeling of palpitations and jitteriness, mild shortness of breath. No obvious arrhythmia noted by ECG in the setting of active symptoms. She has also had other symptoms recently including abdominal pain and leg pain. She does have a history of PSVT and continues on high-dose beta blocker. I reinforced compliance with use around the same time of day, typically in the morning. We will also check a follow-up echocardiogram, she has not had a recent assessment of LVEF.  2. History of mild coronary atherosclerosis by prior workup. Current symptoms do not sound ischemic in etiology.  3. Essential hypertension, blood pressure is normal today.  4. Recent intermittent abdominal pain. Abdominal and pelvic CT from October without acute findings. She was to undergo colonoscopy. Also continues on PPI.  Current medicines were reviewed with the patient today.   Orders Placed  This Encounter  Procedures  . EKG 12-Lead  . ECHOCARDIOGRAM COMPLETE    Disposition: Call with results. Keep regular follow-up.  Signed, Satira Sark, MD, [facility] 07/20/2016 4:07 PM    [facility] [place], [address], [place] [address] Phone: [phone]; Fax: [phone]

## 2016-07-20 NOTE — Patient Instructions (Addendum)

## 2016-07-21 NOTE — Telephone Encounter (Signed)
Tried to call and advise, no answer, will advise when patient returns call Thanks. [name]

## 2016-07-22 ENCOUNTER — Encounter (HOSPITAL_COMMUNITY): Admission: RE | Payer: Self-pay | Source: Ambulatory Visit

## 2016-07-22 ENCOUNTER — Ambulatory Visit (HOSPITAL_COMMUNITY): Admission: RE | Admit: 2016-07-22 | Payer: Medicare Other | Source: Ambulatory Visit | Admitting: Internal Medicine

## 2016-07-22 SURGERY — COLONOSCOPY WITH PROPOFOL
Anesthesia: Monitor Anesthesia Care

## 2016-07-27 NOTE — Telephone Encounter (Signed)
Patient came by to check the status of her pain management request and her pain medicine request Please return her call at [phone]

## 2016-07-28 NOTE — Telephone Encounter (Signed)
Please advise on medication refill.  

## 2016-07-28 NOTE — Telephone Encounter (Signed)
Called patient and left vm advising of Dr. Arvil Persons message. Thanks. [name]

## 2016-07-28 NOTE — Telephone Encounter (Signed)
I have referred her back to Dr. Lorin Mercy and the [place] office, she has no surgical condition, and has chronic pain, I have not seen her in several months. She goes to [facility] for occasional blocks of the C2-3 level. I will no longer prescribe   Narcotics for her. jen

## 2016-08-02 ENCOUNTER — Other Ambulatory Visit: Payer: Medicare Other

## 2016-08-03 ENCOUNTER — Ambulatory Visit: Payer: Medicare Other | Admitting: Gastroenterology

## 2016-08-03 ENCOUNTER — Encounter: Payer: Self-pay | Admitting: Physical Medicine & Rehabilitation

## 2016-08-11 ENCOUNTER — Other Ambulatory Visit: Payer: Self-pay

## 2016-08-11 ENCOUNTER — Telehealth: Payer: Self-pay | Admitting: *Deleted

## 2016-08-11 ENCOUNTER — Ambulatory Visit (INDEPENDENT_AMBULATORY_CARE_PROVIDER_SITE_OTHER): Payer: Medicare Other

## 2016-08-11 DIAGNOSIS — R0602 Shortness of breath: Secondary | ICD-10-CM

## 2016-08-11 NOTE — Telephone Encounter (Signed)
-----   Message from Satira Sark, MD sent at 08/11/2016  1:33 PM EST ----- Results reviewed. Please let her know that LVEF remains normal range. Continue with same treatment plan. A copy of this test should be forwarded to [place], DO.

## 2016-08-11 NOTE — Telephone Encounter (Signed)
Patient informed and copy sent to PCP. 

## 2016-08-27 ENCOUNTER — Ambulatory Visit: Payer: Medicare Other | Admitting: Cardiology

## 2016-08-30 ENCOUNTER — Emergency Department (HOSPITAL_COMMUNITY): Payer: Medicare Other

## 2016-08-30 ENCOUNTER — Encounter (HOSPITAL_COMMUNITY): Payer: Self-pay | Admitting: Emergency Medicine

## 2016-08-30 ENCOUNTER — Emergency Department (HOSPITAL_COMMUNITY)
Admission: EM | Admit: 2016-08-30 | Discharge: 2016-08-30 | Disposition: A | Discharge status: Home / Self Care | Payer: Medicare Other | Attending: Emergency Medicine | Admitting: Emergency Medicine

## 2016-08-30 DIAGNOSIS — I10 Essential (primary) hypertension: Secondary | ICD-10-CM | POA: Insufficient documentation

## 2016-08-30 DIAGNOSIS — R0789 Other chest pain: Secondary | ICD-10-CM | POA: Insufficient documentation

## 2016-08-30 DIAGNOSIS — J449 Chronic obstructive pulmonary disease, unspecified: Secondary | ICD-10-CM | POA: Insufficient documentation

## 2016-08-30 DIAGNOSIS — Z7982 Long term (current) use of aspirin: Secondary | ICD-10-CM | POA: Insufficient documentation

## 2016-08-30 DIAGNOSIS — R0602 Shortness of breath: Secondary | ICD-10-CM | POA: Diagnosis present

## 2016-08-30 DIAGNOSIS — I2510 Atherosclerotic heart disease of native coronary artery without angina pectoris: Secondary | ICD-10-CM | POA: Insufficient documentation

## 2016-08-30 LAB — CBC
HCT: 37.1 % (ref 36.0–46.0)
Hemoglobin: 12.6 g/dL (ref 12.0–15.0)
MCH: 31 pg (ref 26.0–34.0)
MCHC: 34 g/dL (ref 30.0–36.0)
MCV: 91.4 fL (ref 78.0–100.0)
Platelets: 210 10*3/uL (ref 150–400)
RBC: 4.06 MIL/uL (ref 3.87–5.11)
RDW: 13.5 % (ref 11.5–15.5)
WBC: 9.1 10*3/uL (ref 4.0–10.5)

## 2016-08-30 LAB — COMPREHENSIVE METABOLIC PANEL
ALT: 21 U/L (ref 14–54)
AST: 26 U/L (ref 15–41)
Albumin: 3.9 g/dL (ref 3.5–5.0)
Alkaline Phosphatase: 50 U/L (ref 38–126)
Anion gap: 9 (ref 5–15)
BUN: 16 mg/dL (ref 6–20)
CO2: 24 mmol/L (ref 22–32)
Calcium: 9 mg/dL (ref 8.9–10.3)
Chloride: 102 mmol/L (ref 101–111)
Creatinine, Ser: 0.86 mg/dL (ref 0.44–1.00)
GFR calc Af Amer: >60 mL/min (ref >60)
GFR calc non Af Amer: >60 mL/min (ref >60)
Glucose, Bld: 92 mg/dL (ref 65–99)
Potassium: 3.2 mmol/L — ABNORMAL LOW (ref 3.5–5.1)
Sodium: 135 mmol/L (ref 135–145)
Total Bilirubin: 0.6 mg/dL (ref 0.3–1.2)
Total Protein: 6.8 g/dL (ref 6.5–8.1)

## 2016-08-30 LAB — BRAIN NATRIURETIC PEPTIDE: B Natriuretic Peptide: 131 pg/mL — ABNORMAL HIGH (ref 0.0–100.0)

## 2016-08-30 LAB — TROPONIN I: Troponin I: <0.03 ng/mL (ref <0.03)

## 2016-08-30 LAB — D-DIMER, QUANTITATIVE (NOT AT ARMC): D-Dimer, Quant: 0.38 ug/mL-FEU (ref 0.00–0.50)

## 2016-08-30 MED ORDER — POTASSIUM CHLORIDE CRYS ER 20 MEQ PO TBCR
40.0000 meq | EXTENDED_RELEASE_TABLET | Freq: Once | ORAL | Status: AC
  Administered 2016-08-30: 40 meq via ORAL
  Filled 2016-08-30: qty 2

## 2016-08-30 MED ORDER — ACETAMINOPHEN 500 MG PO TABS
ORAL_TABLET | ORAL | Status: AC
  Filled 2016-08-30: qty 2

## 2016-08-30 MED ORDER — HYDROCODONE-ACETAMINOPHEN 5-325 MG PO TABS: ORAL_TABLET | ORAL | 0 refills | Status: DC

## 2016-08-30 MED ORDER — ACETAMINOPHEN 500 MG PO TABS
1000.0000 mg | ORAL_TABLET | Freq: Once | ORAL | Status: AC
  Administered 2016-08-30: 1000 mg via ORAL

## 2016-08-30 NOTE — Discharge Instructions (Signed)
Follow-up with your family doctor later this week for recheck

## 2016-08-30 NOTE — ED Notes (Signed)
Pt states she needs to leave by 4pm.  EDP notified

## 2016-08-30 NOTE — ED Triage Notes (Signed)
Cough,left rib pain, onset Saturday morning, dizziness, SOB
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
## 2016-08-31 ENCOUNTER — Ambulatory Visit: Payer: Medicare Other | Admitting: Physical Medicine & Rehabilitation

## 2016-10-05 ENCOUNTER — Other Ambulatory Visit: Payer: Self-pay

## 2016-10-05 ENCOUNTER — Ambulatory Visit (INDEPENDENT_AMBULATORY_CARE_PROVIDER_SITE_OTHER): Payer: Medicare Other | Admitting: Gastroenterology

## 2016-10-05 ENCOUNTER — Encounter: Payer: Self-pay | Admitting: Gastroenterology

## 2016-10-05 VITALS — BP 109/79 | HR 90 | Temp 98.3°F | Ht 66.0 in | Wt 218.2 lb

## 2016-10-05 DIAGNOSIS — R1310 Dysphagia, unspecified: Secondary | ICD-10-CM | POA: Insufficient documentation

## 2016-10-05 DIAGNOSIS — R197 Diarrhea, unspecified: Secondary | ICD-10-CM | POA: Diagnosis not present

## 2016-10-05 NOTE — Patient Instructions (Signed)
We have scheduled you for a colonoscopy and possible upper endoscopy with dilation if needed.  I would like for you to have the xray of your esophagus first to see if there is another direction we need to go.

## 2016-10-05 NOTE — Patient Instructions (Signed)
No PA needed for TCS+/-EGD/DIL. Decision Id# [id number].  No PA needed for DG Esophagus.

## 2016-10-06 ENCOUNTER — Telehealth: Payer: Self-pay

## 2016-10-06 ENCOUNTER — Encounter: Payer: Self-pay | Admitting: Internal Medicine

## 2016-10-06 ENCOUNTER — Ambulatory Visit (INDEPENDENT_AMBULATORY_CARE_PROVIDER_SITE_OTHER): Payer: Medicare Other | Admitting: Internal Medicine

## 2016-10-06 ENCOUNTER — Telehealth: Payer: Self-pay | Admitting: Internal Medicine

## 2016-10-06 ENCOUNTER — Ambulatory Visit (INDEPENDENT_AMBULATORY_CARE_PROVIDER_SITE_OTHER)
Admission: RE | Admit: 2016-10-06 | Discharge: 2016-10-06 | Disposition: A | Discharge status: Home / Self Care | Payer: Medicare Other | Source: Ambulatory Visit | Attending: Internal Medicine | Admitting: Internal Medicine

## 2016-10-06 ENCOUNTER — Other Ambulatory Visit (INDEPENDENT_AMBULATORY_CARE_PROVIDER_SITE_OTHER): Payer: Medicare Other

## 2016-10-06 VITALS — BP 140/80 | HR 74 | Ht 65.0 in | Wt 221.4 lb

## 2016-10-06 DIAGNOSIS — R0609 Other forms of dyspnea: Secondary | ICD-10-CM

## 2016-10-06 LAB — CBC WITH DIFFERENTIAL/PLATELET
Basophils Absolute: 0 10*3/uL (ref 0.0–0.1)
Basophils Relative: 0.4 % (ref 0.0–3.0)
Eosinophils Absolute: 0.1 10*3/uL (ref 0.0–0.7)
Eosinophils Relative: 1.7 % (ref 0.0–5.0)
HCT: 37.8 % (ref 36.0–46.0)
Hemoglobin: 13 g/dL (ref 12.0–15.0)
Lymphocytes Relative: 36.3 % (ref 12.0–46.0)
Lymphs Abs: 2.9 10*3/uL (ref 0.7–4.0)
MCHC: 34.3 g/dL (ref 30.0–36.0)
MCV: 90.1 fl (ref 78.0–100.0)
Monocytes Absolute: 0.6 10*3/uL (ref 0.1–1.0)
Monocytes Relative: 7.8 % (ref 3.0–12.0)
Neutro Abs: 4.2 10*3/uL (ref 1.4–7.7)
Neutrophils Relative %: 53.8 % (ref 43.0–77.0)
Platelets: 245 10*3/uL (ref 150.0–400.0)
RBC: 4.2 Mil/uL (ref 3.87–5.11)
RDW: 12.4 % (ref 11.5–15.5)
WBC: 7.9 10*3/uL (ref 4.0–10.5)

## 2016-10-06 LAB — NITRIC OXIDE: Nitric Oxide: 20

## 2016-10-06 LAB — BASIC METABOLIC PANEL
BUN: 11 mg/dL (ref 6–23)
CO2: 31 mEq/L (ref 19–32)
Calcium: 9.6 mg/dL (ref 8.4–10.5)
Chloride: 102 mEq/L (ref 96–112)
Creatinine, Ser: 0.99 mg/dL (ref 0.40–1.20)
GFR: 58.76 mL/min — ABNORMAL LOW (ref >60.00)
Glucose, Bld: 105 mg/dL — ABNORMAL HIGH (ref 70–99)
Potassium: 3.4 mEq/L — ABNORMAL LOW (ref 3.5–5.1)
Sodium: 141 mEq/L (ref 135–145)

## 2016-10-06 LAB — D-DIMER, QUANTITATIVE: D-Dimer, Quant: 0.51 mcg/mL FEU — ABNORMAL HIGH (ref <0.50)

## 2016-10-06 LAB — BRAIN NATRIURETIC PEPTIDE: Pro B Natriuretic peptide (BNP): 157 pg/mL — ABNORMAL HIGH (ref 0.0–100.0)

## 2016-10-06 LAB — TSH: TSH: 1.69 u[IU]/mL (ref 0.35–4.50)

## 2016-10-06 MED ORDER — FAMOTIDINE 20 MG PO TABS: ORAL_TABLET | ORAL | 2 refills | Status: DC

## 2016-10-06 NOTE — Progress Notes (Signed)
Subjective:    Patient ID: Michelle Zavala, female    DOB: 1946-03-16,    MRN: [id number]  HPI  28 yowf never smoker never respiratory problems at all until around  2012 with mild chest tightness relieved w/in a few minutes with nebulizer and took one home but only used for a few days then   [facility] November 2017 lost breath suddenly sob  in setting of URI > tried neb from previous helped some / started on symbocort ? When but really did not take it so with this > saba  And referred to pulmonary clinic 10/06/2016 by Dr Melina Copa.   10/06/2016 1st [facility] office visit/ Michelle Zavala   Chief Complaint  Patient presents with  . Pulmonary Consult    Self referral. Pt c/o DOE x 6 wks. She states that she gets winded walking from room to room at home "I can't do anything".    all started with apparent uri while not taking symbicort though it was listed on all her records as active and still not using it  [place] symptoms are gone now, denies much cough, just throat clearing No trouble at rest, no trouble on 2 pillows sleeping Doe with room to room, no assoc chest tightness min improved on neb ? duoneb  Wt up about 20 lbs over baseline vs prior to onset of symptoms  No obvious day to day or daytime variability or assoc excess/ purulent sputum or mucus plugs or hemoptysis or cp or chest tightness, subjective wheeze or overt sinus or hb symptoms. No unusual exp hx or h/o childhood pna/ asthma or knowledge of premature birth.  Sleeping ok without nocturnal  or early am exacerbation  of respiratory  c/o's or need for noct saba. Also denies any obvious fluctuation of symptoms with weather or environmental changes or other aggravating or alleviating factors except as outlined above   Current Medications, Allergies, Complete Past Medical History, Past Surgical History, Family History, and Social History were reviewed in [employer] record.              Review of Systems  Constitutional:  Negative for chills, fever and unexpected weight change.  HENT: Negative for congestion, dental problem, ear pain, nosebleeds, postnasal drip, rhinorrhea, sinus pressure, sneezing, sore throat, trouble swallowing and voice change.   Eyes: Negative for visual disturbance.  Respiratory: Positive for shortness of breath. Negative for cough and choking.   Cardiovascular: Negative for chest pain and leg swelling.  Gastrointestinal: Negative for abdominal pain, diarrhea and vomiting.  Genitourinary: Negative for difficulty urinating.  Musculoskeletal: Negative for arthralgias.  Skin: Negative for rash.  Neurological: Negative for tremors, syncope and headaches.  Hematological: Does not bruise/bleed easily.       Objective:   Physical Exam  amb wf nad  Wt Readings from Last 3 Encounters:  10/06/16 221 lb 6.4 oz (100.4 kg)  10/05/16 218 lb 3.2 oz (99 kg)  08/30/16 215 lb (97.5 kg)    Vital signs reviewed - Note on arrival 02 sats  96% on RA     HEENT: nl dentition, turbinates, and oropharynx. Nl external ear canals without cough reflex   NECK :  without JVD/Nodes/TM/ nl carotid upstrokes bilaterally   LUNGS: no acc muscle use,  Nl contour chest which is clear to A and P bilaterally without cough on insp or exp maneuvers   CV:  RRR  no s3 or murmur or increase in P2, nad no edema   ABD:  soft and nontender with nl inspiratory excursion in the supine position. No bruits or organomegaly appreciated, bowel sounds nl  MS:  Nl gait/ ext warm without deformities, calf tenderness, cyanosis or clubbing No obvious joint restrictions   SKIN: warm and dry without lesions    NEURO:  alert, approp, nl sensorium with  no motor or cerebellar deficits apparent.     CXR PA and Lateral:   10/06/2016 :    I personally reviewed images and agree with radiology impression as follows:    COPD.  No pneumonia, CHF, nor other acute cardiopulmonary disease.  High-grade compression of T12,  stable.     Labs ordered/ reviewed:     Chemistry      Component Value Date/Time   NA 141 10/06/2016 1019   K 3.4 (L) 10/06/2016 1019   CL 102 10/06/2016 1019   CO2 31 10/06/2016 1019   BUN 11 10/06/2016 1019   CREATININE 0.99 10/06/2016 1019      Component Value Date/Time   CALCIUM 9.6 10/06/2016 1019   ALKPHOS 50 08/30/2016 1453   AST 26 08/30/2016 1453   ALT 21 08/30/2016 1453   BILITOT 0.6 08/30/2016 1453        Lab Results  Component Value Date   WBC 7.9 10/06/2016   HGB 13.0 10/06/2016   HCT 37.8 10/06/2016   MCV 90.1 10/06/2016   PLT 245.0 10/06/2016     Lab Results  Component Value Date   DDIMER 0.51 (H) 10/06/2016      Lab Results  Component Value Date   TSH 1.69 10/06/2016     Lab Results  Component Value Date   PROBNP 157.0 (H) 10/06/2016              Assessment & Plan:

## 2016-10-06 NOTE — Patient Instructions (Addendum)
Stop your symbicort and proair and just take your nebulizer as needed up to every 4 hours if you feel it helps your breathing   We may need to take you off the high doses of metaprolol if your problem continues but don't change it for now   Pantoprazole (protonix) 40 mg   Take 30- 60 min before your first and last meals of the day and Pepcid (famotidine)  20 mg one @  bedtime until return to office - this is the best way to tell whether stomach acid is contributing to your problem.    GERD (REFLUX)  is an extremely common cause of respiratory symptoms just like yours , many times with no obvious heartburn at all.    It can be treated with medication, but also with lifestyle changes including elevation of the head of your bed (ideally with 6 inch  bed blocks),  Smoking cessation, avoidance of late meals, excessive alcohol, and avoid fatty foods, chocolate, peppermint, colas, red wine, and acidic juices such as orange juice.  NO MINT OR MENTHOL PRODUCTS SO NO COUGH DROPS   USE SUGARLESS CANDY INSTEAD (Jolley ranchers or Stover's or Life Savers) or even ice chips will also do - the key is to swallow to prevent all throat clearing. NO OIL BASED VITAMINS - use powdered substitutes.    Please remember to go to the lab and x-ray department downstairs for your tests - we will call you with the results when they are available.    See Tammy NP w/in 2 weeks with all your medications, even over the counter meds, separated in two separate bags, the ones you take no matter what vs the ones you stop once you feel better and take only as needed when you feel you need them.   Tammy  will generate for you a new user friendly medication calendar that will put [place] all on the same page re: your medication use.     Without this process, it simply isn't possible to assure that we are providing  your outpatient care  with  the attention to detail we feel you deserve.   If we cannot assure that you're getting that kind of  care,  then we cannot manage your problem effectively from this clinic.  Once you have seen Tammy and we are sure that we're all on the same page with your medication use she will arrange follow up with me.

## 2016-10-06 NOTE — Progress Notes (Signed)
LMTCB

## 2016-10-06 NOTE — Assessment & Plan Note (Addendum)
Body mass index is 36.84 and trending up since prior to onset of sob  Lab Results  Component Value Date   TSH 1.69 10/06/2016     Contributing to gerd tendency/ doe/reviewed the need and the process to achieve and maintain neg calorie balance > defer f/u primary care including intermittently monitoring thyroid status

## 2016-10-06 NOTE — Assessment & Plan Note (Addendum)
Onset Nov 2017 - 10/06/2016   Walked RA x one lap @ 185 stopped due to  Sob no desat, slow pace, some dry cough  - FENO 10/06/2016  =   20  Off symbicort  - Spirometry 10/06/2016  FEV1 1.48 (64%)  Ratio 83 with min curvature in effort indep portion of f/v loop - trial of max rx for gerd 10/06/2016 >>>    Symptoms are markedly disproportionate to objective findings and not clear this is a lung problem but pt does appear to have difficult airway management issues. DDX of  difficult airways management almost all start with A and  include Adherence, Ace Inhibitors, Acid Reflux, Active Sinus Disease, Alpha 1 Antitripsin deficiency, Anxiety masquerading as Airways dz,  ABPA,  Allergy(esp in young), Aspiration (esp in elderly), Adverse effects of meds,  Active smokers, A bunch of PE's (a small clot burden can't cause this syndrome unless there is already severe underlying pulm or vascular dz with poor reserve) plus two Bs  = Bronchiectasis and Beta blocker use..and one C= CHF  Adherence is always the initial "prime suspect" and is a multilayered concern that requires a "trust but verify" approach in every patient - starting with knowing how to use medications, especially inhalers, correctly, keeping up with refills and understanding the fundamental difference between maintenance and prns vs those medications only taken for a very short course and then stopped and not refilled.  - will need to return with all meds in hand using a trust but verify approach to confirm accurate Medication  Reconciliation The principal here is that until we are certain that the  patients are doing what we've asked, it makes no sense to ask them to do more.    ? Acid (or non-acid) GERD > always difficult to exclude as up to 75% of pts in some series report no assoc GI/ Heartburn symptoms> rec max (24h)  acid suppression and diet restrictions/ reviewed and instructions given in writing.   ? Allergy /asthma > nothing at all to suggest  this dx at present/ ok to leave off symbicort and just use saba prn   ? A bunch of PE's >  D dimer nl - while  An upper limit of nl valute  may miss small peripheral pe, the clot burden with sob is moderately high and the d dimer has a very high neg pred value in this setting   ? Anxiety/depression/deconditioning > usually at the bottom of this list of usual suspects but should be much higher on this pt's based on H and P and note already on psychotropics .   ? Beta blocker effects > on high doses of metaprolol - ideally   prefer in this setting: Bystolic, the most beta -1  selective Beta blocker available in sample form, with bisoprolol the most selective generic choice  on the market.  Consider change next ov   ? Chf > note BNP indeterminate range but no orthopnea/ pnd or edema or chf on cxr so doubt though could still have mild diastolic dysfunction    Total time devoted to counseling  > 50 % of 60 min initial  office visit: re non-specific but potentially very serious respiratory symptoms of unknown origin,  review case with pt/ discussion of options/alternatives/ personally creating written customized instructions  in presence of pt  then going over those specific  Instructions directly with the pt including how to use all of the meds but in particular covering each new medication  in detail and the difference between the maintenance/automatic meds and the prns using an action plan format for the latter.  Please see AVS from this visit for a full list of these instructions which I personally wrote for this pt and  are unique to this visit.

## 2016-10-06 NOTE — Telephone Encounter (Signed)
Notes Recorded by Tanda Rockers, MD on 10/06/2016 at 1:23 PM EST Call pt: Reviewed cxr and no acute change so no change in recommendations made at ov  Pt aware of results and voiced her understanding. Nothing further needed.

## 2016-10-06 NOTE — Telephone Encounter (Signed)
LMOVM and informed pt of pre-op appt 10/20/16 at 1:45 pm. Letter mailed.

## 2016-10-07 LAB — RESPIRATORY ALLERGY PROFILE REGION II ~~LOC~~
Allergen, A. alternata, m6: <0.1 kU/L
Allergen, C. Herbarum, M2: <0.1 kU/L
Allergen, Cedar tree, t12: <0.1 kU/L
Allergen, Comm Silver Birch, t9: <0.1 kU/L
Allergen, Cottonwood, t14: <0.1 kU/L
Allergen, D pternoyssinus,d7: <0.1 kU/L
Allergen, Mouse Urine Protein, e78: <0.1 kU/L
Allergen, Mulberry, t76: <0.1 kU/L
Allergen, Oak,t7: <0.1 kU/L
Allergen, P. notatum, m1: <0.1 kU/L
Aspergillus fumigatus, m3: <0.1 kU/L
Bermuda Grass: <0.1 kU/L
Box Elder IgE: <0.1 kU/L
Cat Dander: <0.1 kU/L
Cockroach: <0.1 kU/L
Common Ragweed: <0.1 kU/L
D. farinae: <0.1 kU/L
Dog Dander: <0.1 kU/L
Elm IgE: <0.1 kU/L
IgE (Immunoglobulin E), Serum: <2 kU/L (ref <115)
Johnson Grass: <0.1 kU/L
Pecan/Hickory Tree IgE: <0.1 kU/L
Rough Pigweed  IgE: <0.1 kU/L
Sheep Sorrel IgE: <0.1 kU/L
Timothy Grass: <0.1 kU/L

## 2016-10-07 NOTE — Progress Notes (Signed)
Spoke with pt and notified of results per Dr. Wert. Pt verbalized understanding and denied any questions. 

## 2016-10-08 ENCOUNTER — Ambulatory Visit (HOSPITAL_COMMUNITY)
Admission: RE | Admit: 2016-10-08 | Discharge: 2016-10-08 | Disposition: A | Discharge status: Home / Self Care | Payer: Medicare Other | Source: Ambulatory Visit | Attending: Gastroenterology | Admitting: Gastroenterology

## 2016-10-08 DIAGNOSIS — K224 Dyskinesia of esophagus: Secondary | ICD-10-CM | POA: Insufficient documentation

## 2016-10-08 DIAGNOSIS — R1310 Dysphagia, unspecified: Secondary | ICD-10-CM | POA: Diagnosis not present

## 2016-10-09 NOTE — Assessment & Plan Note (Signed)
71 year old female with history of chronic diarrhea, chronic abdominal pain, thoroughly evaluated as noted in HPI. Weight continues to increase, not decrease. History of colonoscopy in 2016 by Dr. Britta Mccreedy but no colonic biopsies at that time or evaluation of TI after review of records. Negative stool studies, no improvement with Bentyl or Imodium. Not a candidate for Viberzi due to absent gallbladder. As of note, she has even had a hydrogen breath test, which was negative. Question of chronic IBS, +/- bile salt diarrhea, but I would like to proceed with colonoscopy with random colonic biopsies by [place], as we have not evaluated her colon and rule out any other etiology.   Proceed with TCS with Dr. Gala Romney in near future: the risks, benefits, and alternatives have been discussed with the patient in detail. The patient states understanding and desires to proceed. PROPOFOL due to polypharmacy

## 2016-10-09 NOTE — Progress Notes (Signed)
Referring Provider: Octavio Graves, DO Primary Care Physician:  Octavio Graves, DO Primary GI: Dr. Gala Romney   Chief Complaint  Patient presents with  . Colonoscopy    HPI:   Michelle Zavala is a 71 y.o. female presenting today with a history of chronic epigastric pain, s/p EGD Nov 2016 with only reactive gastropathy found. Negative H.pylori. 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 Progression of SMA disease could cause issues, but clinical monitoring recommended per IR. She was seen by Vascular Surgery due to renal artery disease as well. Medical therapy recommended. Symptomatic fat-containing ventral hernia noted at visit in March 2017, undergoing hernia repair in March 2017. No weight loss. Chronic diarrhea. Negative hydrogen breath test for small bowel bacterial overgrowth.   CT 01/12/16 showed a 10 cm length of large bowel narrowing in the region of the hepatic flexure, may be transient. No mass. Single contrast barium enema recommended to confirm the absence of a fixed structural lesion. Barium enema was normal. Last colonoscopy was by Dr. Britta Mccreedy in 2016 and due again in 5 years. She has had another CT as of Oct 2017 without any acute findings.   At last visit in October 2017, she had been scheduled for a colonoscopy with random colonic biopsies, as she had worsening of diarrhea, incontinence, negative stool studies. Even though she had a colonoscopy by Dr. Britta Mccreedy in 2016, no biopsies were done or evaluation of TI. For some reason, she cancelled this colonoscopy.   Returns today with persistent loose stools. Feels dizzy. Chronic upper abdominal pain unchanged. Feels full, early satiety. No rectal bleeding. Takes Protonix BID. Vomits about once a week with diarrhea. BC powders once in awhile. Gets strangled with liquids but no solid food dysphagia. Has to "gurgle it up". She is requesting an upper endoscopy at time of colonoscopy. No improvement with Imodium  and Bentyl historically.    Past Medical History:  Diagnosis Date  . Anxiety   . Arthritis   . C2 cervical fracture ([place]) 09/17/13   Traumatic fracture witth minimal displacement  . Chronic lung disease    Fibrosis - Dr. Koleen Nimrod  . COPD (chronic obstructive pulmonary disease) ([place])   . Coronary atherosclerosis of native coronary artery    Mild atherosclerosis 3/10, LVEF 60-65%  . Depression   . Diverticulosis   . Essential hypertension, benign   . GERD (gastroesophageal reflux disease)   . PSVT (paroxysmal supraventricular tachycardia) ([place])     Past Surgical History:  Procedure Laterality Date  . ABDOMINAL HYSTERECTOMY    . ANTERIOR RELEASE VERTEBRAL BODY W/ POSTERIOR FUSION    . BACTERIAL OVERGROWTH TEST N/A 02/19/2016   Procedure: BACTERIAL OVERGROWTH TEST;  Surgeon: Daneil Dolin, MD;  Location: [facility];  Service: Endoscopy;  Laterality: N/A;  0700  . BIOPSY N/A 08/04/2015   Procedure: BIOPSY;  Surgeon: Daneil Dolin, MD;  Location: [facility];  Service: Endoscopy;  Laterality: N/A;  Gastric  . CESAREAN SECTION    . CHOLECYSTECTOMY  1973  . COLONOSCOPY  June 2016   Dr. Britta Mccreedy: moderate diverticulosis in sigmoid colon, surveillance in 5 years   . ESOPHAGOGASTRODUODENOSCOPY (EGD) WITH PROPOFOL N/A 08/04/2015   Dr. Rourk:abnormal gastric mucosa s/p biopsy. Reactive gastropathy. Negative H.pylori  . INCISIONAL HERNIA REPAIR N/A 12/03/2015   Procedure: [name] HERNIORRHAPHY WITH MESH;  Surgeon: Aviva Signs, MD;  Location: [facility];  Service: General;  Laterality: N/A;  . INSERTION OF MESH  12/03/2015   Procedure: INSERTION OF MESH;  Surgeon: Aviva Signs, MD;  Location: [facility];  Service: General;;  . TMJ ARTHROSCOPY Bilateral 02/04/2015   Procedure: BILATERAL TEMPOROMANDIBULAR JOINT (TMJ) ARTHROSCOPY MENISECTOMY WITH FAT GRAFT FROM ABDOMEN ;  Surgeon: Diona Browner, DDS;  Location: [facility];  Service: Oral Surgery;  Laterality: Bilateral;  . TOTAL KNEE ARTHROPLASTY Bilateral   .  TUBAL LIGATION      Current Outpatient Prescriptions  Medication Sig Dispense Refill  . acetaminophen (TYLENOL) 650 MG CR tablet Take 1,300 mg by mouth daily as needed for pain. Reported on 09/29/2015    . alfuzosin (UROXATRAL) 10 MG 24 hr tablet Take 10 mg by mouth daily with breakfast.     . amitriptyline (ELAVIL) 75 MG tablet Take 75 mg by mouth at bedtime.  6  . Aspirin-Salicylamide-Caffeine (ARTHRITIS STRENGTH BC POWDER PO) Take 1 packet by mouth daily as needed (pain).     . fluticasone (FLONASE) 50 MCG/ACT nasal spray Place 1 spray into the nose daily as needed for allergies.     . hydrochlorothiazide (HYDRODIURIL) 25 MG tablet Take 25 mg by mouth daily.    [name] ipratropium-albuterol (DUONEB) 0.5-2.5 (3) MG/3ML SOLN Take 3 mLs by nebulization 2 (two) times daily as needed (shortness of breath).   0  . LORazepam (ATIVAN) 2 MG tablet Take 2-4 mg by mouth See admin instructions. Take 2 mg by mouth in the morning, 2 mg at noon, and 4 mg at bedtime    . meclizine (ANTIVERT) 25 MG tablet Take 25 mg by mouth 3 (three) times daily as needed for dizziness.    . Melatonin 5 MG TABS Take 5 mg by mouth at bedtime.    . metoprolol succinate (TOPROL-XL) 100 MG 24 hr tablet TAKE ONE TABLET BY MOUTH DAILY. 30 tablet 6  . Multiple Vitamins-Minerals (HAIR SKIN NAILS PO) Take 1 tablet by mouth daily.    . ondansetron (ZOFRAN ODT) 4 MG disintegrating tablet Take 1 tablet (4 mg total) by mouth every 8 (eight) hours as needed for nausea. 10 tablet 0  . pantoprazole (PROTONIX) 40 MG tablet Take 1 tablet (40 mg total) by mouth 2 (two) times daily. 60 tablet 1  . sertraline (ZOLOFT) 100 MG tablet Take 100 mg by mouth daily.      . Vitamin D, Ergocalciferol, (DRISDOL) 50000 UNITS CAPS capsule Take 50,000 Units by mouth every 7 (seven) days. Take on Wednesday and Saturday.    . famotidine (PEPCID) 20 MG tablet One at bedtime 30 tablet 2   No current facility-administered medications for this visit.     Allergies  as of 10/05/2016 - Review Complete 10/05/2016  Allergen Reaction Noted  . Sulfonamide derivatives Hives     Family History  Problem Relation Age of Onset  . Coronary artery disease Father     Premature  . Heart disease Father   . Coronary artery disease Brother     Premature  . Coronary artery disease Sister     Premature  . Colon cancer Neg Hx     Social History   Social History  . Marital status: Widowed    Spouse name: N/A  . Number of children: 3  . Years of education: 7   Occupational History  . Retired    Social History Main Topics  . Smoking status: Never Smoker  . Smokeless tobacco: Never Used  . Alcohol use No  . Drug use: No  . Sexual activity: No   Other Topics  Concern  . None   Social History Narrative   Occasionally drinks [employer]     Review of Systems: Gen: Denies fever, chills, anorexia. Denies fatigue, weakness, weight loss.  CV: Denies chest pain, palpitations, syncope, peripheral edema, and claudication. Resp: Denies dyspnea at rest, cough, wheezing, coughing up blood, and pleurisy. GI: see HPI  Derm: Denies rash, itching, dry skin Psych: Denies depression, anxiety, memory loss, confusion. No homicidal or suicidal ideation.  Heme: Denies bruising, bleeding, and enlarged lymph nodes.  Physical Exam: BP 109/79   Pulse 90   Temp 98.3 F (36.8 C) (Oral)   Ht 5\' 6"  (1.676 m)   Wt 218 lb 3.2 oz (99 kg)   BMI 35.22 kg/m  General:   Alert and oriented. No distress noted. Pleasant and cooperative.  Head:  Normocephalic and atraumatic. Eyes:  Conjuctiva clear without scleral icterus. Mouth:  Oral mucosa pink and moist. Good dentition. No lesions. Heart:  S1, S2 present without murmurs, rubs, or gallops. Regular rate and rhythm. Abdomen:  +BS, soft, chronic mild TTP epigastric region and non-distended. No rebound or guarding. No HSM or masses noted. Obese.  Msk:  Symmetrical without gross deformities. Normal posture. Extremities:  Without  edema. Neurologic:  Alert and  oriented x4;  grossly normal neurologically. Psych:  Alert and cooperative. Normal mood and affect.   Lab Results  Component Value Date   WBC 7.9 10/06/2016   HGB 13.0 10/06/2016   HCT 37.8 10/06/2016   MCV 90.1 10/06/2016   PLT 245.0 10/06/2016   Lab Results  Component Value Date   TSH 1.69 10/06/2016   Lab Results  Component Value Date   ALT 21 08/30/2016   AST 26 08/30/2016   ALKPHOS 50 08/30/2016   BILITOT 0.6 08/30/2016

## 2016-10-09 NOTE — Assessment & Plan Note (Signed)
Notes liquid dysphagia, taking Protonix BID. Last EGD fairly recent as of Nov 2016 with only reactive gastropathy. I feel she may have an underlying motility disorder. She is requesting an EGD. Will proceed with BPE first and consider EGD/dilation at time of colonoscopy with PROPOFOL if appropriate.

## 2016-10-11 NOTE — Progress Notes (Signed)
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 This is probably why she is having issues with liquids. I am not convinced an EGD would be helpful at this point; she has a colonoscopy +/-EGD/dilaton planned in case Dr. Gala Romney feels an EGD would be appropriate. At this point, last EGD was fairly recent (2016).

## 2016-10-11 NOTE — Progress Notes (Signed)
CC'D TO PCP °

## 2016-10-18 ENCOUNTER — Telehealth: Payer: Self-pay | Admitting: Internal Medicine

## 2016-10-18 NOTE — Patient Instructions (Signed)
Michelle Zavala  10/18/2016     @PREFPERIOPPHARMACY @   Your procedure is scheduled on 10/25/2016.  Report to [facility] at 7:45 A.M.  Call this number if you have problems the morning of surgery:  [phone]   Remember:  Do not eat food or drink liquids after midnight.  Take these medicines the morning of surgery with A SIP OF WATER Uroxitral, Symbicort, Pepcid, Flonase, Duoneb, Ativan, Toprol,  Zofran if needed, Zoloft, Protonix   Do not wear jewelry, make-up or nail polish.  Do not wear lotions, powders, or perfumes, or deoderant.  Do not shave 48 hours prior to surgery.  Men may shave face and neck.  Do not bring valuables to the hospital.  [facility] is not responsible for any belongings or valuables.  Contacts, dentures or bridgework may not be worn into surgery.  Leave your suitcase in the car.  After surgery it may be brought to your room.  For patients admitted to the hospital, discharge time will be determined by your treatment team.  Patients discharged the day of surgery will not be allowed to drive home.    Please read over the following fact sheets that you were given. Anesthesia Post-op Instructions     PATIENT INSTRUCTIONS POST-ANESTHESIA  IMMEDIATELY FOLLOWING SURGERY:  Do not drive or operate machinery for the first twenty four hours after surgery.  Do not make any important decisions for twenty four hours after surgery or while taking narcotic pain medications or sedatives.  If you develop intractable nausea and vomiting or a severe headache please notify your doctor immediately.  FOLLOW-UP:  Please make an appointment with your surgeon as instructed. You do not need to follow up with anesthesia unless specifically instructed to do so.  WOUND CARE INSTRUCTIONS (if applicable):  Keep a dry clean dressing on the anesthesia/puncture wound site if there is drainage.  Once the wound has quit draining you may leave it open to air.  Generally you should leave the  bandage intact for twenty four hours unless there is drainage.  If the epidural site drains for more than 36-48 hours please call the anesthesia department.  QUESTIONS?:  Please feel free to call your physician or the hospital operator if you have any questions, and they will be happy to assist you.      Colonoscopy, Adult A colonoscopy is an exam to look at the entire large intestine. During the exam, a lubricated, bendable tube is inserted into the anus and then passed into the rectum, colon, and other parts of the large intestine. A colonoscopy is often done as a part of normal colorectal screening or in response to certain symptoms, such as anemia, persistent diarrhea, abdominal pain, and blood in the stool. The exam can help screen for and diagnose medical problems, including:  Tumors.  Polyps.  Inflammation.  Areas of bleeding. Tell a health care provider about:  Any allergies you have.  All medicines you are taking, including vitamins, herbs, eye drops, creams, and over-the-counter medicines.  Any problems you or family members have had with anesthetic medicines.  Any blood disorders you have.  Any surgeries you have had.  Any medical conditions you have.  Any problems you have had passing stool. What are the risks? Generally, this is a safe procedure. However, problems may occur, including:  Bleeding.  A tear in the intestine.  A reaction to medicines given during the exam.  Infection (rare). What happens before the procedure? Eating and drinking  restrictions  Follow instructions from your health care provider about eating and drinking, which may include:  A few days before the procedure - follow a low-fiber diet. Avoid nuts, seeds, dried fruit, raw fruits, and vegetables.  1-3 days before the procedure - follow a clear liquid diet. Drink only clear liquids, such as clear broth or bouillon, black coffee or tea, clear juice, clear soft drinks or sports drinks,  gelatin desert, and popsicles. Avoid any liquids that contain red or purple dye.  On the day of the procedure - do not eat or drink anything during the 2 hours before the procedure, or within the time period that your health care provider recommends. Bowel prep  If you were prescribed an oral bowel prep to clean out your colon:  Take it as told by your health care provider. Starting the day before your procedure, you will need to drink a large amount of medicated liquid. The liquid will cause you to have multiple loose stools until your stool is almost clear or light green.  If your skin or anus gets irritated from diarrhea, you may use these to relieve the irritation:  Medicated wipes, such as adult wet wipes with aloe and vitamin E.  A skin soothing-product like petroleum jelly.  If you vomit while drinking the bowel prep, take a break for up to 60 minutes and then begin the bowel prep again. If vomiting continues and you cannot take the bowel prep without vomiting, call your health care provider. General instructions  Ask your health care provider about changing or stopping your regular medicines. This is especially important if you are taking diabetes medicines or blood thinners.  Plan to have someone take you home from the hospital or clinic. What happens during the procedure?  An IV tube may be inserted into one of your veins.  You will be given medicine to help you relax (sedative).  To reduce your risk of infection:  Your health care team will wash or sanitize their hands.  Your anal area will be washed with soap.  You will be asked to lie on your side with your knees bent.  Your health care provider will lubricate a long, thin, flexible tube. The tube will have a camera and a light on the end.  The tube will be inserted into your anus.  The tube will be gently eased through your rectum and colon.  Air will be delivered into your colon to keep it open. You may feel  some pressure or cramping.  The camera will be used to take images during the procedure.  A small tissue sample may be removed from your body to be examined under a microscope (biopsy). If any potential problems are found, the tissue will be sent to a lab for testing.  If small polyps are found, your health care provider may remove them and have them checked for cancer cells.  The tube that was inserted into your anus will be slowly removed. The procedure may vary among health care providers and hospitals. What happens after the procedure?  Your blood pressure, heart rate, breathing rate, and blood oxygen level will be monitored until the medicines you were given have worn off.  Do not drive for 24 hours after the exam.  You may have a small amount of blood in your stool.  You may pass gas and have mild abdominal cramping or bloating due to the air that was used to inflate your colon during the exam.  It is up to you to get the results of your procedure. Ask your health care provider, or the department performing the procedure, when your results will be ready. This information is not intended to replace advice given to you by your health care provider. Make sure you discuss any questions you have with your health care provider. Document Released: 08/27/2000 Document Revised: 03/19/2016 Document Reviewed: 11/11/2015 Elsevier Interactive Patient Education  2017 [employer]. Esophagogastroduodenoscopy Introduction Esophagogastroduodenoscopy (EGD) is a procedure to examine the lining of the esophagus, stomach, and first part of the small intestine (duodenum). This procedure is done to check for problems such as inflammation, bleeding, ulcers, or growths. During this procedure, a long, flexible, lighted tube with a camera attached (endoscope) is inserted down the throat. Tell a health care provider about:  Any allergies you have.  All medicines you are taking, including vitamins, herbs,  eye drops, creams, and over-the-counter medicines.  Any problems you or family members have had with anesthetic medicines.  Any blood disorders you have.  Any surgeries you have had.  Any medical conditions you have.  Whether you are pregnant or may be pregnant. What are the risks? Generally, this is a safe procedure. However, problems may occur, including:  Infection.  Bleeding.  A tear (perforation) in the esophagus, stomach, or duodenum.  Trouble breathing.  Excessive sweating.  Spasms of the larynx.  A slowed heartbeat.  Low blood pressure. What happens before the procedure?  Follow instructions from your health care provider about eating or drinking restrictions.  Ask your health care provider about:  Changing or stopping your regular medicines. This is especially important if you are taking diabetes medicines or blood thinners.  Taking medicines such as aspirin and ibuprofen. These medicines can thin your blood. Do not take these medicines before your procedure if your health care provider instructs you not to.  Plan to have someone take you home after the procedure.  If you wear dentures, be ready to remove them before the procedure. What happens during the procedure?  To reduce your risk of infection, your health care team will wash or sanitize their hands.  An IV tube will be put in a vein in your hand or arm. You will get medicines and fluids through this tube.  You will be given one or more of the following:  A medicine to help you relax (sedative).  A medicine to numb the area (local anesthetic). This medicine may be sprayed into your throat. It will make you feel more comfortable and keep you from gagging or coughing during the procedure.  A medicine for pain.  A mouth guard may be placed in your mouth to protect your teeth and to keep you from biting on the endoscope.  You will be asked to lie on your left side.  The endoscope will be lowered  down your throat into your esophagus, stomach, and duodenum.  Air will be put into the endoscope. This will help your health care provider see better.  The lining of your esophagus, stomach, and duodenum will be examined.  Your health care provider may:  Take a tissue sample so it can be looked at in a lab (biopsy).  Remove growths.  Remove objects (foreign bodies) that are stuck.  Treat any bleeding with medicines or other devices that stop tissue from bleeding.  Widen (dilate) or stretch narrowed areas of your esophagus and stomach.  The endoscope will be taken out. The procedure may vary among health care  providers and hospitals. What happens after the procedure?  Your blood pressure, heart rate, breathing rate, and blood oxygen level will be monitored often until the medicines you were given have worn off.  Do not eat or drink anything until the numbing medicine has worn off and your gag reflex has returned. This information is not intended to replace advice given to you by your health care provider. Make sure you discuss any questions you have with your health care provider. Document Released: 12/31/2004 Document Revised: 02/05/2016 Document Reviewed: 07/24/2015  2017 Elsevier Esophageal Dilatation Esophageal dilatation is a procedure to open a blocked or narrowed part of the esophagus. The esophagus is the long tube in your throat that carries food and liquid from your mouth to your stomach. The procedure is also called esophageal dilation. You may need this procedure if you have a buildup of scar tissue in your esophagus that makes it difficult, painful, or even impossible to swallow. This can be caused by gastroesophageal reflux disease (GERD). In rare cases, people need this procedure because they have cancer of the esophagus or a problem with the way food moves through the esophagus. Sometimes you may need to have another dilatation to enlarge the opening of the esophagus  gradually. Tell a health care provider about:  Any allergies you have.  All medicines you are taking, including vitamins, herbs, eye drops, creams, and over-the-counter medicines.  Any problems you or family members have had with anesthetic medicines.  Any blood disorders you have.  Any surgeries you have had.  Any medical conditions you have.  Any antibiotic medicines you are required to take before dental procedures. What are the risks? Generally, this is a safe procedure. However, problems can occur and include:  Bleeding from a tear in the lining of the esophagus.  A hole (perforation) in the esophagus. What happens before the procedure?  Do not eat or drink anything after midnight on the night before the procedure or as directed by your health care provider.  Ask your health care provider about changing or stopping your regular medicines. This is especially important if you are taking diabetes medicines or blood thinners.  Plan to have someone take you home after the procedure. What happens during the procedure?  You will be given a medicine that makes you relaxed and sleepy (sedative).  A medicine may be sprayed or gargled to numb the back of the throat.  Your health care provider can use various instruments to do an esophageal dilatation. During the procedure, the instrument used will be placed in your mouth and passed down into your esophagus. Options include:  Simple dilators. This instrument is carefully placed in the esophagus to stretch it.  Guided wire bougies. In this method, a flexible tube (endoscope) is used to insert a wire into the esophagus. The dilator is passed over this wire to enlarge the esophagus. Then the wire is removed.  Balloon dilators. An endoscope with a small balloon at the end is passed down into the esophagus. Inflating the balloon gently stretches the esophagus and opens it up. What happens after the procedure?  Your blood pressure,  heart rate, breathing rate, and blood oxygen level will be monitored often until the medicines you were given have worn off.  Your throat may feel slightly sore and will probably still feel numb. This will improve slowly over time.  You will not be allowed to eat or drink until the throat numbness has resolved.  If this is a  same-day procedure, you may be allowed to go home once you have been able to drink, urinate, and sit on the edge of the bed without nausea or dizziness.  If this is a same-day procedure, you should have a friend or family member with you for the next 24 hours after the procedure. This information is not intended to replace advice given to you by your health care provider. Make sure you discuss any questions you have with your health care provider. Document Released: 10/21/2005 Document Revised: 02/05/2016 Document Reviewed: 01/09/2014 Elsevier Interactive Patient Education  2017 [employer].

## 2016-10-18 NOTE — Telephone Encounter (Signed)
Spoke with the pt  I advised I did not call her again after speaking with her about cxr  She states she must have forget to delete the VM  Nothing further needed

## 2016-10-20 ENCOUNTER — Encounter (HOSPITAL_COMMUNITY): Payer: Self-pay

## 2016-10-20 ENCOUNTER — Encounter (HOSPITAL_COMMUNITY)
Admission: RE | Admit: 2016-10-20 | Discharge: 2016-10-20 | Disposition: A | Discharge status: Home / Self Care | Payer: Medicare Other | Source: Ambulatory Visit | Attending: Internal Medicine | Admitting: Internal Medicine

## 2016-10-20 DIAGNOSIS — E876 Hypokalemia: Secondary | ICD-10-CM | POA: Diagnosis not present

## 2016-10-20 DIAGNOSIS — Z01818 Encounter for other preprocedural examination: Secondary | ICD-10-CM | POA: Diagnosis not present

## 2016-10-20 LAB — SURGICAL PCR SCREEN
MRSA, PCR: NEGATIVE
Staphylococcus aureus: NEGATIVE

## 2016-10-20 LAB — CBC
HCT: 37.2 % (ref 36.0–46.0)
Hemoglobin: 13.2 g/dL (ref 12.0–15.0)
MCH: 31.5 pg (ref 26.0–34.0)
MCHC: 35.5 g/dL (ref 30.0–36.0)
MCV: 88.8 fL (ref 78.0–100.0)
Platelets: 253 10*3/uL (ref 150–400)
RBC: 4.19 MIL/uL (ref 3.87–5.11)
RDW: 12.7 % (ref 11.5–15.5)
WBC: 17.5 10*3/uL — ABNORMAL HIGH (ref 4.0–10.5)

## 2016-10-20 LAB — BASIC METABOLIC PANEL
Anion gap: 11 (ref 5–15)
BUN: 16 mg/dL (ref 6–20)
CO2: 22 mmol/L (ref 22–32)
Calcium: 8.9 mg/dL (ref 8.9–10.3)
Chloride: 104 mmol/L (ref 101–111)
Creatinine, Ser: 1.23 mg/dL — ABNORMAL HIGH (ref 0.44–1.00)
GFR calc Af Amer: 50 mL/min — ABNORMAL LOW (ref >60)
GFR calc non Af Amer: 43 mL/min — ABNORMAL LOW (ref >60)
Glucose, Bld: 164 mg/dL — ABNORMAL HIGH (ref 65–99)
Potassium: 3.4 mmol/L — ABNORMAL LOW (ref 3.5–5.1)
Sodium: 137 mmol/L (ref 135–145)

## 2016-10-20 NOTE — Progress Notes (Signed)
Chronic hypokalemia noted. Is she taking any potassium supplements? Creatinine has also bumped a bit to 1.23. This is above her baseline. WBC count is 17.5. Does she have any signs/symptoms of infection? Her procedure is upcoming on 2/12 but we need to sort this out first. Need to send labs to PCP and let them know about her Cr. I would like to recheck Cdiff (the toxin A/B with PCR) ASAP. She has had chronic diarrhea and a negative Cdiff a few months ago.

## 2016-10-21 ENCOUNTER — Telehealth: Payer: Self-pay | Admitting: Internal Medicine

## 2016-10-21 NOTE — Telephone Encounter (Signed)
Routing to AB. Please see result note.

## 2016-10-21 NOTE — Telephone Encounter (Signed)
Candy Sledge, RN at Short Stay called with questions about patient's labs (WBC and creatine). Patient is scheduled for procedure on 2/12 and wanted to address this in case the procedure needs to be pushed back. I told her that I would have a nurse call her back. [phone]

## 2016-10-22 ENCOUNTER — Ambulatory Visit (INDEPENDENT_AMBULATORY_CARE_PROVIDER_SITE_OTHER): Payer: Medicare Other | Admitting: Adult Health

## 2016-10-22 ENCOUNTER — Encounter: Payer: Self-pay | Admitting: Adult Health

## 2016-10-22 ENCOUNTER — Other Ambulatory Visit (INDEPENDENT_AMBULATORY_CARE_PROVIDER_SITE_OTHER): Payer: Medicare Other

## 2016-10-22 VITALS — BP 132/68 | HR 107 | Temp 97.9°F | Ht 65.0 in | Wt 213.0 lb

## 2016-10-22 DIAGNOSIS — R0609 Other forms of dyspnea: Secondary | ICD-10-CM

## 2016-10-22 DIAGNOSIS — D72829 Elevated white blood cell count, unspecified: Secondary | ICD-10-CM | POA: Diagnosis not present

## 2016-10-22 DIAGNOSIS — R9389 Abnormal findings on diagnostic imaging of other specified body structures: Secondary | ICD-10-CM

## 2016-10-22 DIAGNOSIS — R938 Abnormal findings on diagnostic imaging of other specified body structures: Secondary | ICD-10-CM | POA: Diagnosis not present

## 2016-10-22 DIAGNOSIS — R1312 Dysphagia, oropharyngeal phase: Secondary | ICD-10-CM

## 2016-10-22 LAB — CBC WITH DIFFERENTIAL/PLATELET
Basophils Absolute: 0.1 10*3/uL (ref 0.0–0.1)
Basophils Relative: 0.7 % (ref 0.0–3.0)
Eosinophils Absolute: 0 10*3/uL (ref 0.0–0.7)
Eosinophils Relative: 0.2 % (ref 0.0–5.0)
HCT: 41.8 % (ref 36.0–46.0)
Hemoglobin: 14.3 g/dL (ref 12.0–15.0)
Lymphocytes Relative: 26.7 % (ref 12.0–46.0)
Lymphs Abs: 3.9 10*3/uL (ref 0.7–4.0)
MCHC: 34.3 g/dL (ref 30.0–36.0)
MCV: 90.3 fl (ref 78.0–100.0)
Monocytes Absolute: 1.3 10*3/uL — ABNORMAL HIGH (ref 0.1–1.0)
Monocytes Relative: 9.1 % (ref 3.0–12.0)
Neutro Abs: 9.2 10*3/uL — ABNORMAL HIGH (ref 1.4–7.7)
Neutrophils Relative %: 63.3 % (ref 43.0–77.0)
Platelets: 292 10*3/uL (ref 150.0–400.0)
RBC: 4.62 Mil/uL (ref 3.87–5.11)
RDW: 12.7 % (ref 11.5–15.5)
WBC: 14.5 10*3/uL — ABNORMAL HIGH (ref 4.0–10.5)

## 2016-10-22 NOTE — Progress Notes (Signed)
Chart and office note reviewed in detail  > agree with a/p as outlined    

## 2016-10-22 NOTE — Assessment & Plan Note (Signed)
Elevated WBC recently , asymptomatic  Recheck CBC today .

## 2016-10-22 NOTE — Progress Notes (Signed)
@Patient  ID: Michelle Zavala, female    DOB: Feb 09, 1946, 71 y.o.   MRN: [id number]  Chief Complaint  Patient presents with  . Follow-up    med review Michelle Zavala     Referring provider: Octavio Graves, DO  HPI: 72 yo female never smoker seen for pulmonary consult 10/06/16 for DOE that begin in [facility] Nov 2017   TEST  CT chest 2015 >chronic volume loss and archietectural distortion in lower lobes.  Spirometry   10/22/2016 Follow up : DOE/Med Review  Patient returns for a two-week follow-up. Patient was seen last visit for pulmonary consult for dyspnea on exertion. Patient says that beginning in November of last year. She started having significant shortness of breath with walking. She has shortness of breath to the point that she has to sit down and rest. She has difficulty doing her housework. Prior to November. Patient says that she had no significant shortness of breath. Was able to go shopping and do her housework without any difficulty. Denies any significant cough. No fever, weight loss, hemoptysis, chest pain, orthopnea, PND, or increased edema. She is followed by cardiology for PSVT on metoprolol . Echo in November 2017 showed mild LVH with EF of 55-60% with grade 1 diastolic dysfunction Says she has no known history of amiodarone or Macrodantin use. She has no history of cancer or chemotherapy or radiation exposure. She denies any frequent bronchitis or pneumonias. She is a never smoker. She denies known history of autoimmune or connective tissue disorders. No family history of autoimmune. 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 She is from the local area [place]. Spirometry last visit showed moderate restriction with FEV1 at 64%, ratio 83%, FVC 58%. Was reviewed and showed a normal CBC allergy profile with a low IgE normal TSH. D-dimer was borderline at 0.51. Patient had a previous CT chest in 2015 that showed chronic volume loss and architectural distortion in the lower  lobes. Chest x-ray showed chronic interstitial changes in the lower lobes that were stable. FENO testing was nml . Symbicort was stopped. She did not notice flare of cough , wheezing or dyspnea. She was started on PPI/Pepcid for possible GERD compoenent w/ upper airway irritation. Walk test in office last ov with no desats.   Patient does request lab work today. She was recently had preop labs for colonoscopy and her white count was elevated. However 2 weeks ago. CBC white count was completely normal. Denies any fever, congestion, discolored mucus, sinus pain pressure.  We reviewed all her meds and oragnized them into a med calendar with pt edcuation. Appears to be taking correctly .    Allergies  Allergen Reactions  . Sulfonamide Derivatives Hives    REACTION: hives     There is no immunization history on file for this patient.  Past Medical History:  Diagnosis Date  . Anxiety   . Arthritis   . C2 cervical fracture ([place]) 09/17/13   Traumatic fracture witth minimal displacement  . Chronic lung disease    Fibrosis - Dr. Koleen Nimrod  . COPD (chronic obstructive pulmonary disease) ([place])   . Coronary atherosclerosis of native coronary artery    Mild atherosclerosis 3/10, LVEF 60-65%  . Depression   . Diverticulosis   . Dysrhythmia    SVT  . Essential hypertension, benign   . GERD (gastroesophageal reflux disease)   . PSVT (paroxysmal supraventricular tachycardia) ([facility])     Tobacco History: History  Smoking Status  . Never Smoker  Smokeless  Tobacco  . Never Used   Counseling given: Not Answered   Outpatient Encounter Prescriptions as of 10/22/2016  Medication Sig  . acetaminophen (TYLENOL) 650 MG CR tablet Take 1,300 mg by mouth daily as needed for pain. Reported on 09/29/2015  . alfuzosin (UROXATRAL) 10 MG 24 hr tablet Take 10 mg by mouth daily with breakfast.   . amitriptyline (ELAVIL) 75 MG tablet Take 75 mg by mouth at bedtime.  . budesonide-formoterol (SYMBICORT) 160-4.5  MCG/ACT inhaler Inhale 2 puffs into the lungs 2 (two) times daily as needed (for shortness of breath/walking long distances.).  [name] famotidine (PEPCID) 20 MG tablet One at bedtime (Patient taking differently: Take 20 mg by mouth at bedtime. )  . fluticasone (FLONASE) 50 MCG/ACT nasal spray Place 1 spray into the nose daily as needed for allergies.   . hydrochlorothiazide (HYDRODIURIL) 25 MG tablet Take 25 mg by mouth daily.  [name] ipratropium-albuterol (DUONEB) 0.5-2.5 (3) MG/3ML SOLN Take 3 mLs by nebulization 2 (two) times daily as needed (shortness of breath).   . LORazepam (ATIVAN) 2 MG tablet Take 2-4 mg by mouth See admin instructions. Take 2 mg by mouth in the morning, 2 mg at noon, and 4 mg at bedtime  . meclizine (ANTIVERT) 25 MG tablet Take 25 mg by mouth 3 (three) times daily as needed for dizziness.  . metoprolol succinate (TOPROL-XL) 100 MG 24 hr tablet TAKE ONE TABLET BY MOUTH DAILY.  . Multiple Vitamins-Minerals (HAIR SKIN NAILS PO) Take 1 tablet by mouth daily.  . ondansetron (ZOFRAN ODT) 4 MG disintegrating tablet Take 1 tablet (4 mg total) by mouth every 8 (eight) hours as needed for nausea.  . pantoprazole (PROTONIX) 40 MG tablet Take 1 tablet (40 mg total) by mouth 2 (two) times daily.  . sertraline (ZOLOFT) 100 MG tablet Take 100 mg by mouth daily.    . Vitamin D, Ergocalciferol, (DRISDOL) 50000 UNITS CAPS capsule Take 50,000 Units by mouth every 7 (seven) days. Saturday or Sunday   No facility-administered encounter medications on file as of 10/22/2016.      Review of Systems  Constitutional:   No  weight loss, night sweats,  Fevers, chills, fatigue, or  lassitude.  HEENT:   No headaches,  Difficulty swallowing,  Tooth/dental problems, or  Sore throat,                No sneezing, itching, ear ache, nasal congestion, post nasal drip,   CV:  No chest pain,  Orthopnea, PND, swelling in lower extremities, anasarca, dizziness, palpitations, syncope.   GI  No heartburn,  indigestion, abdominal pain, nausea, vomiting, diarrhea, change in bowel habits, loss of appetite, bloody stools.   Resp: DOE+   No excess mucus, no productive cough,  No non-productive cough,  No coughing up of blood.  No change in color of mucus.  No wheezing.  No chest wall deformity  Skin: no rash or lesions.  GU: no dysuria, change in color of urine, no urgency or frequency.  No flank pain, no hematuria   MS:  No joint pain or swelling.  No decreased range of motion.  No back pain.    Physical Exam  BP 132/68 (BP Location: Left Arm, Cuff Size: Normal)   Pulse (!) 107   Temp 97.9 F (36.6 C) (Oral)   Ht 5\' 5"  (1.651 m)   Wt 213 lb (96.6 kg)   SpO2 98%   BMI 35.45 kg/m   GEN: A/Ox3; pleasant , NAD, obese  HEENT:  [place]/AT,  EACs-clear, TMs-wnl, NOSE-clear, THROAT-clear, no lesions, no postnasal drip or exudate noted.   NECK:  Supple w/ fair ROM; no JVD; normal carotid impulses w/o bruits; no thyromegaly or nodules palpated; no lymphadenopathy.    RESP  Clear  P & A; w/o, wheezes/ rales/ or rhonchi. no accessory muscle use, no dullness to percussion  CARD:  RRR, no m/r/g, no peripheral edema, pulses intact, no cyanosis or clubbing.  GI:   Soft & nt; nml bowel sounds; no organomegaly or masses detected.   Musco: Warm bil, no deformities or joint swelling noted.   Neuro: alert, no focal deficits noted.    Skin: Warm, no lesions or rashes    Lab Results:  CBC    Component Value Date/Time   WBC 17.5 (H) 10/20/2016 1346   RBC 4.19 10/20/2016 1346   HGB 13.2 10/20/2016 1346   HCT 37.2 10/20/2016 1346   PLT 253 10/20/2016 1346   MCV 88.8 10/20/2016 1346   MCH 31.5 10/20/2016 1346   MCHC 35.5 10/20/2016 1346   RDW 12.7 10/20/2016 1346   LYMPHSABS 2.9 10/06/2016 1019   MONOABS 0.6 10/06/2016 1019   EOSABS 0.1 10/06/2016 1019   BASOSABS 0.0 10/06/2016 1019    BMET    Component Value Date/Time   NA 137 10/20/2016 1346   K 3.4 (L) 10/20/2016 1346   CL 104  10/20/2016 1346   CO2 22 10/20/2016 1346   GLUCOSE 164 (H) 10/20/2016 1346   BUN 16 10/20/2016 1346   CREATININE 1.23 (H) 10/20/2016 1346   CALCIUM 8.9 10/20/2016 1346   GFRNONAA 43 (L) 10/20/2016 1346   GFRAA 50 (L) 10/20/2016 1346    BNP    Component Value Date/Time   BNP 131.0 (H) 08/30/2016 1453    ProBNP    Component Value Date/Time   PROBNP 157.0 (H) 10/06/2016 1019    Imaging: Dg Chest 2 View  Result Date: 10/06/2016 CLINICAL DATA:  Two months of dyspnea. History of COPD, coronary artery disease, hypertension, nonsmoker. EXAM: CHEST  2 VIEW COMPARISON:  PA and lateral chest x-ray of August 30, 2016 FINDINGS: The lungs are will adequately inflated. The interstitial markings are chronically increased but stable. There is no alveolar infiltrate or pleural effusion. The heart and pulmonary vascularity are normal. There is calcification in the wall of the tortuous thoracic aorta. The trachea is midline. The bony thorax exhibits no acute abnormality. There is wedge compression of T12 which appears stable. IMPRESSION: COPD.  No pneumonia, CHF, nor other acute cardiopulmonary disease. High-grade compression of T12, stable. Thoracic aortic atherosclerosis. Electronically Signed   By: David  Martinique M.D.   On: 10/06/2016 10:38   Dg Esophagus  Result Date: 10/08/2016 CLINICAL DATA:  Dysphagia for 2 months, gets strangled on liquids and sometimes feels like solids hang up in the esophagus EXAM: ESOPHOGRAM / BARIUM SWALLOW / BARIUM TABLET STUDY TECHNIQUE: Combined double contrast and single contrast examination performed using effervescent crystals, thick barium liquid, and thin barium liquid. The patient was observed with fluoroscopy swallowing a 13 mm barium sulphate tablet. FLUOROSCOPY TIME:  Fluoroscopy Time:  1 minute 42 seconds Radiation Exposure Index (if provided by the fluoroscopic device): 34.4 mGy Number of Acquired Spot Images: Multiple screen captures during fluoroscopy  COMPARISON:  None FINDINGS: Normal esophageal distention. No esophageal mass or stricture. 12.5 mm diameter barium tablet passes from oral cavity to stomach without delay. No hiatal hernia or persistent intraluminal filling defect. Age-related impairment of esophageal motility with incomplete  clearance of barium by primary peristaltic waves. Clearance facilitated by upright positioning and secondary waves. Smooth appearance of esophageal mucosa without irregularity or ulceration. Targeted rapid sequence imaging of the cervical esophagus and hypopharynx showed no laryngeal penetration or aspiration. Minimal vallecular residuals noted. Evidence of prior cervical spine fusion. IMPRESSION: Age-related esophageal dysmotility. Minimal vallecular residuals. Otherwise negative exam. Electronically Signed   By: Lavonia Dana M.D.   On: 10/08/2016 11:31     Assessment & Plan:   Dyspnea on exertion DOE x 3 months ? Etiology . She has some moderate restriction with interstitial changes on chest xray and ?scarring on previous CT chest in 2015 .  Will need HRCT Chest to r/o ILD changes  Set up PFT .  Continue off Symbicort.  Cont on GERD regimen .   Plan  Patient Instructions  Set up for HRCT Chest .  follow up Dr. Melvyn Novas  In 3-4 weeks with PFT .  Follow med calendar closely and bring to each visit .  Labs today .     Dysphagia Cont on GERD tx regimen    Leukocytosis Elevated WBC recently , asymptomatic  Recheck CBC today .      Rexene Edison, NP 10/22/2016

## 2016-10-22 NOTE — Progress Notes (Signed)
Her white count is the only thing that is going to be an issue. Hypokalemia is being addressed. With her symptoms of respiratory illness, I think we need to postpone her procedure. Needs to see her PCP and have labs rechecked then faxed to [place]. I can see her back in the office to update H&P if needed at no charge. We need to postpone procedures for now due to abnormal leukocytosis.

## 2016-10-22 NOTE — Patient Instructions (Addendum)
Set up for HRCT Chest .  follow up Dr. Melvyn Novas  In 3-4 weeks with PFT .  Follow med calendar closely and bring to each visit .  Labs today .

## 2016-10-22 NOTE — Telephone Encounter (Signed)
See result note. Agree, need to push out. We need to have her see PCP and make sure nothing going on respiratory-wise, as she has those symptoms. Need repeat CBC prior to pursuing procedures, with resolution of leukocytosis.

## 2016-10-22 NOTE — Assessment & Plan Note (Signed)
Cont on GERD tx regimen

## 2016-10-22 NOTE — Telephone Encounter (Signed)
Spoke with the pt and she is aware. See result note. Pt will be coming in for an ov.

## 2016-10-22 NOTE — Assessment & Plan Note (Signed)
DOE x 3 months ? Etiology . She has some moderate restriction with interstitial changes on chest xray and ?scarring on previous CT chest in 2015 .  Will need HRCT Chest to r/o ILD changes  Set up PFT .  Continue off Symbicort.  Cont on GERD regimen .   Plan  Patient Instructions  Set up for HRCT Chest .  follow up Dr. Melvyn Novas  In 3-4 weeks with PFT .  Follow med calendar closely and bring to each visit .  Labs today .

## 2016-10-25 ENCOUNTER — Encounter: Payer: Self-pay | Admitting: Gastroenterology

## 2016-10-25 ENCOUNTER — Ambulatory Visit (HOSPITAL_COMMUNITY): Admission: RE | Admit: 2016-10-25 | Payer: Medicare Other | Source: Ambulatory Visit | Admitting: Internal Medicine

## 2016-10-25 ENCOUNTER — Encounter (HOSPITAL_COMMUNITY): Admission: RE | Payer: Self-pay | Source: Ambulatory Visit

## 2016-10-25 SURGERY — COLONOSCOPY WITH PROPOFOL
Anesthesia: Monitor Anesthesia Care

## 2016-10-25 NOTE — Progress Notes (Signed)
APPT MADE AND LETTER SENT  °

## 2016-10-25 NOTE — Progress Notes (Signed)
Spoke with patient and informed her of results. Patient had no questions. Nothing further needed.

## 2016-10-26 NOTE — Addendum Note (Signed)
Addended by: Tyson Dense on: 10/26/2016 11:11 AM   Modules accepted: Orders

## 2016-11-01 ENCOUNTER — Ambulatory Visit (HOSPITAL_COMMUNITY): Admission: RE | Admit: 2016-11-01 | Payer: Medicare Other | Source: Ambulatory Visit

## 2016-11-11 ENCOUNTER — Ambulatory Visit: Payer: Medicare Other | Admitting: Gastroenterology

## 2016-11-15 ENCOUNTER — Encounter (HOSPITAL_COMMUNITY): Payer: Medicare Other

## 2016-11-15 ENCOUNTER — Ambulatory Visit: Payer: Medicare Other | Admitting: Internal Medicine

## 2016-11-23 ENCOUNTER — Other Ambulatory Visit: Payer: Self-pay | Admitting: Cardiology

## 2016-11-24 ENCOUNTER — Encounter (HOSPITAL_COMMUNITY): Payer: Medicare Other

## 2016-12-02 ENCOUNTER — Ambulatory Visit: Payer: Medicare Other | Admitting: Internal Medicine

## 2016-12-09 ENCOUNTER — Ambulatory Visit: Payer: Medicare Other | Admitting: Internal Medicine

## 2016-12-22 ENCOUNTER — Other Ambulatory Visit: Payer: Self-pay | Admitting: Cardiology

## 2017-01-05 ENCOUNTER — Ambulatory Visit (INDEPENDENT_AMBULATORY_CARE_PROVIDER_SITE_OTHER): Payer: Medicare Other | Admitting: Neurology

## 2017-01-05 ENCOUNTER — Encounter: Payer: Self-pay | Admitting: Neurology

## 2017-01-05 VITALS — BP 154/92 | HR 76 | Ht 65.0 in | Wt 211.0 lb

## 2017-01-05 DIAGNOSIS — R42 Dizziness and giddiness: Secondary | ICD-10-CM
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
## 2017-01-05 NOTE — Patient Instructions (Signed)
Since the meclizine has not helped, I would recommend you stop it.  Unfortunately, dizziness is a very common complaint but is often not due to a primary neurological reason or single underlying medical problem. Often, there a combination of factors, that result in dizziness. This includes blood pressure fluctuations, medication side effects, blood sugar fluctuations, stress, vertigo, poor sleep with sleep deprivation, dehydration, and electrolyte disturbance or other metabolic and endocrinological reasons, meaning hormone related problems such as thyroid dysfunction or diabetes symptoms. I will order a brain MRI. We will call you with the test results. Due to increase in creatinine, we will do the MRI without contrast. Please follow up with Dr. Melina Copa to consider physical therapy and a referral to ENT. Neurologically, I do not see any focal findings.

## 2017-01-17 NOTE — Progress Notes (Signed)
Cardiology Office Note  Date: 01/18/2017   ID: Michelle Zavala, DOB 08-24-1946, MRN [id number]  PCP: Octavio Graves, DO  Primary Cardiologist: Rozann Lesches, MD   Chief Complaint  Patient presents with  . SVT    History of Present Illness: Michelle Zavala is a 71 y.o. female last seen in November 2017. She presents for a routine follow-up visit. Reports no progressive palpitations since last encounter. She remains on relatively high-dose Toprol-XL.  Follow-up echocardiogram from November of last year showed normal LVEF as detailed below. We went over the results again today.  She is now following with Dr. Melvyn Novas in the Pulmonary division. States that she has follow-up PFTs pending for later in the month. She also continues to follow with Dr. Melina Copa.  She does not report any angina symptoms.  Past Medical History:  Diagnosis Date  . Anxiety   . Arthritis   . C2 cervical fracture ([place]) 09/17/13   Traumatic fracture witth minimal displacement  . Chronic lung disease    Fibrosis - Dr. Koleen Nimrod  . COPD (chronic obstructive pulmonary disease) ([place])   . Coronary atherosclerosis of native coronary artery    Mild atherosclerosis 3/10, LVEF 60-65%  . Depression   . Diverticulosis   . Essential hypertension, benign   . GERD (gastroesophageal reflux disease)   . PSVT (paroxysmal supraventricular tachycardia) ([place])     Past Surgical History:  Procedure Laterality Date  . ABDOMINAL HYSTERECTOMY    . ANTERIOR RELEASE VERTEBRAL BODY W/ POSTERIOR FUSION    . BACTERIAL OVERGROWTH TEST N/A 02/19/2016   Procedure: BACTERIAL OVERGROWTH TEST;  Surgeon: Daneil Dolin, MD;  [facility]: [facility];  Service: Endoscopy;  Laterality: N/A;  0700  . BIOPSY N/A 08/04/2015   Procedure: BIOPSY;  Surgeon: Daneil Dolin, MD;  Location: [facility] OR[facility];  Service: Endoscopy;  Laterality: N/A;  Gastric  . CESAREAN SECTION    . CHOLECYSTECTOMY  1973  . COLONOSCOPY  June 2016   Dr. Britta Mccreedy: moderate diverticulosis  in sigmoid colon, surveillance in 5 years   . ESOPHAGOGASTRODUODENOSCOPY (EGD) WITH PROPOFOL N/A 08/04/2015   Dr. Rourk:abnormal gastric mucosa s/p biopsy. Reactive gastropathy. Negative H.pylori  . INCISIONAL HERNIA REPAIR N/A 12/03/2015   Procedure: [name] HERNIORRHAPHY WITH MESH;  Surgeon: Aviva Signs, MD;  Location: [facility];  Service: General;  Laterality: N/A;  . INSERTION OF MESH  12/03/2015   Procedure: INSERTION OF MESH;  Surgeon: Aviva Signs, MD;  Location: [facility];  Service: General;;  . TMJ ARTHROSCOPY Bilateral 02/04/2015   Procedure: BILATERAL TEMPOROMANDIBULAR JOINT (TMJ) ARTHROSCOPY MENISECTOMY WITH FAT GRAFT FROM ABDOMEN ;  Surgeon: Diona Browner, DDS;  Location: [facility];  Service: Oral Surgery;  Laterality: Bilateral;  . TOTAL KNEE ARTHROPLASTY Bilateral   . TUBAL LIGATION      Current Outpatient Prescriptions  Medication Sig Dispense Refill  . acetaminophen (TYLENOL) 650 MG CR tablet Take 650 mg by mouth every 8 (eight) hours as needed for pain.    [name] albuterol (PROAIR HFA) 108 (90 Base) MCG/ACT inhaler Inhale 2 puffs into the lungs every 4 (four) hours as needed for wheezing or shortness of breath.    [name] albuterol (PROVENTIL) (2.5 MG/3ML) 0.083% nebulizer solution Take 2.5 mg by nebulization every 4 (four) hours as needed for wheezing or shortness of breath.    . alfuzosin (UROXATRAL) 10 MG 24 hr tablet Take 10 mg by mouth daily with breakfast.    . amitriptyline (ELAVIL) 75 MG tablet Take 75 mg by  mouth at bedtime.  6  . budesonide-formoterol (SYMBICORT) 160-4.5 MCG/ACT inhaler Inhale 2 puffs into the lungs 2 (two) times daily.    . fluticasone (FLONASE) 50 MCG/ACT nasal spray Place 2 sprays into both nostrils daily as needed for allergies or rhinitis.    . hydrochlorothiazide (HYDRODIURIL) 25 MG tablet Take 25 mg by mouth daily.    [name] LORazepam (ATIVAN) 2 MG tablet Take 2 mg by mouth 2 (two) times daily. Take 2 mg by mouth in the morning, 2 mg at noon, and 4 mg at bedtime    .  meclizine (ANTIVERT) 25 MG tablet Take 25 mg by mouth daily as needed for dizziness.    . Melatonin 5 MG TABS Take 5 mg by mouth at bedtime as needed.    . metoprolol succinate (TOPROL-XL) 100 MG 24 hr tablet TAKE ONE TABLET BY MOUTH DAILY. 30 tablet 3  . ondansetron (ZOFRAN) 4 MG tablet Take 4 mg by mouth every 8 (eight) hours as needed for nausea or vomiting.    . pantoprazole (PROTONIX) 40 MG tablet Take 1 tablet (40 mg total) by mouth 2 (two) times daily. (Patient taking differently: Take 40 mg by mouth daily. ) 60 tablet 1  . sertraline (ZOLOFT) 100 MG tablet Take 100 mg by mouth at bedtime.     . Vitamin D, Ergocalciferol, (DRISDOL) 50000 units CAPS capsule Take 50,000 Units by mouth every 7 (seven) days.     No current facility-administered medications for this visit.    Allergies:  Sulfonamide derivatives   Social History: The patient  reports that she has never smoked. She has never used smokeless tobacco. She reports that she does not drink alcohol or use drugs.   ROS:  Please see the history of present illness. Otherwise, complete review of systems is positive for interval pneumonia and vertigo.  All other systems are reviewed and negative.   Physical Exam: VS:  BP (!) 145/85   Pulse 90   Ht 5\' 4"  (1.626 m)   Wt 214 lb (97.1 kg)   BMI 36.73 kg/m , BMI Body mass index is 36.73 kg/m.  Wt Readings from Last 3 Encounters:  01/18/17 214 lb (97.1 kg)  01/05/17 211 lb (95.7 kg)  10/22/16 213 lb (96.6 kg)    Obese woman, appears comfortable at rest.  HEENT: Conjunctiva and lids normal, oropharynx with moist mucosa.  Neck: Supple, no elevated JVP or carotid bruits.  Lungs: No wheezing, nonlabored.  Cardiac: Regular rate and rhythm, no S3 gallop or rub.  Abdomen: Obese, nontender, bowel sounds present.  Skin: Warm and dry.  Musculoskeletal: No kyphosis.  Extremities: No pitting edema, distal pulses full.  Neuropsychiatric: Alert and oriented x3, affect grossly  normal.  ECG: I personally reviewed the tracing from 08/30/2016 which showed sinus rhythm with left anterior fascicular block and borderline low voltage.  Recent Labwork: 08/30/2016: ALT 21; AST 26; B Natriuretic Peptide 131.0 10/06/2016: Pro B Natriuretic peptide (BNP) 157.0; TSH 1.69 10/20/2016: BUN 16; Creatinine, Ser 1.23; Potassium 3.4; Sodium 137 10/22/2016: Hemoglobin 14.3; Platelets 292.0   Other Studies Reviewed Today:  Echocardiogram 08/11/2016: Study Conclusions  - Left ventricle: The cavity size was normal. Wall thickness was   increased in a pattern of mild LVH. Systolic function was normal.   The estimated ejection fraction was in the range of 55% to 60%.   Doppler parameters are consistent with abnormal left ventricular   relaxation (grade 1 diastolic dysfunction). - Aortic valve: There was mild regurgitation. Valve  area (VTI):   2.42 cm^2. Valve area (Vmax): 2.22 cm^2. Valve area (Vmean): 2.29   cm^2. - Technically difficult study.  Assessment and Plan:  1. History of PSVT, no progressive palpitations. She remains on Toprol-XL 100 mg daily. Continue observation.  2. Mild coronary artery disease based on previous evaluation. She does not report any obvious angina symptoms. LVEF normal range at 55-60% by recent echocardiogram. Recommend aspirin 81 mg daily and statin.  3. Essential hypertension, systolic in the 272Z today. We discussed sodium restriction, weight loss would also be beneficial. Keep follow-up with Dr. Melina Copa.  4. Pulmonary follow-up pending with Dr. Melvyn Novas, PFTs arranged. I would not be opposed to switching current beta blocker to bisoprolol or even a calcium channel blocker if pulmonary status requires.  Current medicines were reviewed with the patient today.  Disposition: Follow-up in 6 months.  Signed, Satira Sark, MD, [facility] 01/18/2017 12:31 PM    [facility] [place], [place], [place] [address] Phone: [phone]; Fax: [phone]

## 2017-01-18 ENCOUNTER — Encounter: Payer: Self-pay | Admitting: Cardiology

## 2017-01-18 ENCOUNTER — Ambulatory Visit (INDEPENDENT_AMBULATORY_CARE_PROVIDER_SITE_OTHER): Payer: Medicare Other | Admitting: Cardiology

## 2017-01-18 VITALS — BP 145/85 | HR 90 | Ht 64.0 in | Wt 214.0 lb

## 2017-01-18 DIAGNOSIS — I2510 Atherosclerotic heart disease of native coronary artery without angina pectoris: Secondary | ICD-10-CM | POA: Diagnosis not present

## 2017-01-18 DIAGNOSIS — I471 Supraventricular tachycardia: Secondary | ICD-10-CM

## 2017-01-18 DIAGNOSIS — I10 Essential (primary) hypertension: Secondary | ICD-10-CM

## 2017-01-18 NOTE — Patient Instructions (Signed)

## 2017-01-27 ENCOUNTER — Ambulatory Visit
Admission: RE | Admit: 2017-01-27 | Discharge: 2017-01-27 | Disposition: A | Discharge status: Home / Self Care | Payer: Medicare Other | Source: Ambulatory Visit | Attending: Neurology | Admitting: Neurology

## 2017-01-27 DIAGNOSIS — R42 Dizziness and giddiness: Secondary | ICD-10-CM

## 2017-01-31 ENCOUNTER — Telehealth: Payer: Self-pay

## 2017-01-31 NOTE — Telephone Encounter (Signed)
-----   Message from [name], MD sent at 01/31/2017  7:56 AM EDT ----- Please call patient re: MRI brain. It showed essentially unchanged findings from 2015, just increase in L mastoid air cell fluid, could be due to inner ear problems. No acute findings though, may want to consult with ENT at some point. No further action required from neurological standpoint.  [name], MD, PhD [facility])

## 2017-01-31 NOTE — Telephone Encounter (Signed)
LM (per DPR) with results and recommendations below. Left call back number if any further questions.

## 2017-01-31 NOTE — Progress Notes (Signed)
Please call patient re: MRI brain. It showed essentially unchanged findings from 2015, just increase in L mastoid air cell fluid, could be due to inner ear problems. No acute findings though, may want to consult with ENT at some point. No further action required from neurological standpoint.  [name], MD, PhD [facility])

## 2017-02-02 ENCOUNTER — Encounter: Payer: Self-pay | Admitting: Internal Medicine

## 2017-02-02 ENCOUNTER — Other Ambulatory Visit (INDEPENDENT_AMBULATORY_CARE_PROVIDER_SITE_OTHER): Payer: Medicare Other

## 2017-02-02 ENCOUNTER — Other Ambulatory Visit: Payer: Self-pay | Admitting: Internal Medicine

## 2017-02-02 ENCOUNTER — Ambulatory Visit (INDEPENDENT_AMBULATORY_CARE_PROVIDER_SITE_OTHER): Payer: Medicare Other | Admitting: Internal Medicine

## 2017-02-02 ENCOUNTER — Ambulatory Visit (INDEPENDENT_AMBULATORY_CARE_PROVIDER_SITE_OTHER)
Admission: RE | Admit: 2017-02-02 | Discharge: 2017-02-02 | Disposition: A | Discharge status: Home / Self Care | Payer: Medicare Other | Source: Ambulatory Visit | Attending: Internal Medicine | Admitting: Internal Medicine

## 2017-02-02 VITALS — BP 124/80 | HR 96 | Ht 63.0 in | Wt 213.0 lb

## 2017-02-02 DIAGNOSIS — R0609 Other forms of dyspnea: Secondary | ICD-10-CM

## 2017-02-02 DIAGNOSIS — I10 Essential (primary) hypertension: Secondary | ICD-10-CM

## 2017-02-02 LAB — BASIC METABOLIC PANEL
BUN: 18 mg/dL (ref 6–23)
CO2: 27 mEq/L (ref 19–32)
Calcium: 9.5 mg/dL (ref 8.4–10.5)
Chloride: 99 mEq/L (ref 96–112)
Creatinine, Ser: 1.12 mg/dL (ref 0.40–1.20)
GFR: 50.92 mL/min — ABNORMAL LOW (ref >60.00)
Glucose, Bld: 119 mg/dL — ABNORMAL HIGH (ref 70–99)
Potassium: 3.1 mEq/L — ABNORMAL LOW (ref 3.5–5.1)
Sodium: 138 mEq/L (ref 135–145)

## 2017-02-02 LAB — CBC WITH DIFFERENTIAL/PLATELET
Basophils Absolute: 0.1 10*3/uL (ref 0.0–0.1)
Basophils Relative: 1.3 % (ref 0.0–3.0)
Eosinophils Absolute: 0.1 10*3/uL (ref 0.0–0.7)
Eosinophils Relative: 0.6 % (ref 0.0–5.0)
HCT: 40.2 % (ref 36.0–46.0)
Hemoglobin: 13.6 g/dL (ref 12.0–15.0)
Lymphocytes Relative: 31.7 % (ref 12.0–46.0)
Lymphs Abs: 3.1 10*3/uL (ref 0.7–4.0)
MCHC: 33.8 g/dL (ref 30.0–36.0)
MCV: 89.5 fl (ref 78.0–100.0)
Monocytes Absolute: 0.7 10*3/uL (ref 0.1–1.0)
Monocytes Relative: 7.6 % (ref 3.0–12.0)
Neutro Abs: 5.7 10*3/uL (ref 1.4–7.7)
Neutrophils Relative %: 58.8 % (ref 43.0–77.0)
Platelets: 263 10*3/uL (ref 150.0–400.0)
RBC: 4.49 Mil/uL (ref 3.87–5.11)
RDW: 14.2 % (ref 11.5–15.5)
WBC: 9.6 10*3/uL (ref 4.0–10.5)

## 2017-02-02 LAB — PULMONARY FUNCTION TEST
DL/VA % pred: 100 %
DL/VA: 4.7 ml/min/mmHg/L
DLCO cor % pred: 63 %
DLCO cor: 14.56 ml/min/mmHg
DLCO unc % pred: 64 %
DLCO unc: 14.83 ml/min/mmHg
FEF 25-75 Post: 1.87 L/sec
FEF 25-75 Pre: 1.86 L/sec
FEF2575-%Change-Post: 0 %
FEF2575-%Pred-Post: 104 %
FEF2575-%Pred-Pre: 104 %
FEV1-%Change-Post: 0 %
FEV1-%Pred-Post: 79 %
FEV1-%Pred-Pre: 79 %
FEV1-Post: 1.69 L
FEV1-Pre: 1.7 L
FEV1FVC-%Change-Post: 3 %
FEV1FVC-%Pred-Pre: 109 %
FEV6-%Change-Post: -3 %
FEV6-%Pred-Post: 72 %
FEV6-%Pred-Pre: 75 %
FEV6-Post: 1.96 L
FEV6-Pre: 2.04 L
FEV6FVC-%Pred-Post: 105 %
FEV6FVC-%Pred-Pre: 105 %
FVC-%Change-Post: -3 %
FVC-%Pred-Post: 69 %
FVC-%Pred-Pre: 72 %
FVC-Post: 1.96 L
FVC-Pre: 2.04 L
Post FEV1/FVC ratio: 86 %
Post FEV6/FVC ratio: 100 %
Pre FEV1/FVC ratio: 83 %
Pre FEV6/FVC Ratio: 100 %
RV % pred: 75 %
RV: 1.64 L
TLC % pred: 76 %
TLC: 3.76 L

## 2017-02-02 LAB — TSH: TSH: 1.54 u[IU]/mL (ref 0.35–4.50)

## 2017-02-02 LAB — BRAIN NATRIURETIC PEPTIDE: Pro B Natriuretic peptide (BNP): 71 pg/mL (ref 0.0–100.0)

## 2017-02-02 LAB — SEDIMENTATION RATE: Sed Rate: 3 mm/hr (ref 0–30)

## 2017-02-02 MED ORDER — METOPROLOL SUCCINATE ER 100 MG PO TB24: ORAL_TABLET | ORAL | Status: DC

## 2017-02-02 MED ORDER — POTASSIUM CHLORIDE CRYS ER 20 MEQ PO TBCR: 40.0000 meq | EXTENDED_RELEASE_TABLET | Freq: Every day | ORAL | 2 refills | Status: DC

## 2017-02-02 NOTE — Progress Notes (Signed)
Subjective:    Patient ID: Michelle Zavala, female    DOB: May 25, 1946,    MRN: [id number]    Brief patient profile:  57 yowf never smoker never respiratory problems at all until around  2012 with mild chest tightness relieved w/in a few minutes with nebulizer and took one home but only used for a few days then   [facility] November 2017 lost breath suddenly sob  in setting of URI > tried neb from previous helped some / started on symbocort ? When but really did not take it so with this > saba  And referred to pulmonary clinic 10/06/2016 by Dr Melina Copa.   History of Present Illness  10/06/2016 1st [facility] office visit/ Michelle Zavala   Chief Complaint  Patient presents with  . Pulmonary Consult    Self referral. Pt c/o DOE x 6 wks. She states that she gets winded walking from room to room at home "I can't do anything".    all started with apparent uri while not taking symbicort though it was listed on all her records as active and still not using it  [place] symptoms are gone now, denies much cough, just throat clearing No trouble at rest, no trouble on 2 pillows sleeping Doe with room to room, no assoc chest tightness min improved on neb ? duoneb  Wt up about 20 lbs over baseline vs prior to onset of symptoms rec Stop your symbicort and proair and just take your nebulizer as needed up to every 4 hours if you feel it helps your breathing  We may need to take you off the high doses of metaprolol if your problem continues but don't change it for now  Pantoprazole (protonix) 40 mg   Take 30- 60 min before your first and last meals of the day and Pepcid (famotidine)  20 mg one @  bedtime until return to office - this is the best way to tell whether stomach acid is contributing to your problem.   GERD diet   10/22/16  NP eval for purpose of med reconciliation: rec Set up for HRCT Chest > never done  follow up Dr. Melvyn Novas  In 3-4 weeks with PFT > not done Follow med calendar closely and bring to each visit > not  done       02/02/2017  Extended f/u ov/Michelle Zavala re:  Post hosp f/u for "aecopd"  (note she has no copd) / no med calendar, does not recognize copy we provided her so clearly has not been using/ confused with meds Chief Complaint  Patient presents with  . Follow-up    PFT's done today.  Pt states her breathing has been worse since had PNA approx 2 months ago and had to be hospitalized x 4 days in [place]. She also c/o occ cough and wheezing. Her cough is non prod.    last fall was just on  symbicort as needed and stable/chronic doe but much worse since admit with "sepsis secondary to copd" in [facility] = can't walk 100 yards even at a slow pace at a flat grade s stopping due to sob   Sleeps fine flate   No obvious day to day or daytime variability or assoc excess/ purulent sputum or mucus plugs or hemoptysis or cp or chest tightness, subjective wheeze or overt sinus or hb symptoms. No unusual exp hx or h/o childhood pna/ asthma or knowledge of premature birth.  Sleeping ok without nocturnal  or early am exacerbation  of respiratory  c/o's or need for noct saba. Also denies any obvious fluctuation of symptoms with weather or environmental changes or other aggravating or alleviating factors except as outlined above   Current Medications, Allergies, Complete Past Medical History, Past Surgical History, Family History, and Social History were reviewed in [employer] record.  ROS  The following are not active complaints unless bolded sore throat, dysphagia, dental problems, itching, sneezing,  nasal congestion or excess/ purulent secretions, ear ache,   fever, chills, sweats, unintended wt loss, classically pleuritic or exertional cp,  orthopnea pnd or leg swelling, presyncope, palpitations, abdominal pain, anorexia, nausea, vomiting, diarrhea  or change in bowel or bladder habits, change in stools or urine, dysuria,hematuria,  rash, arthralgias, visual complaints,  headache, numbness, weakness or ataxia or problems with walking or coordination,  change in mood/affect or memory.                          Objective:   Physical Exam  amb wf nad   02/02/2017      213   10/06/16 221 lb 6.4 oz (100.4 kg)  10/05/16 218 lb 3.2 oz (99 kg)  08/30/16 215 lb (97.5 kg)    Vital signs reviewed - Note on arrival 02 sats  96% on RA     HEENT: nl dentition, turbinates, and oropharynx. Nl external ear canals without cough reflex   NECK :  without JVD/Nodes/TM/ nl carotid upstrokes bilaterally   LUNGS: no acc muscle use,  Nl contour chest which is clear to A and P bilaterally without cough on insp or exp maneuvers   CV:  RRR  no s3 or murmur or increase in P2, nad no edema   ABD:  Quite obese but soft and nontender with nl inspiratory excursion in the supine position. No bruits or organomegaly appreciated, bowel sounds nl  MS:  Nl gait/ ext warm without deformities, calf tenderness, cyanosis or clubbing No obvious joint restrictions   SKIN: warm and dry without lesions    NEURO:  alert, approp, nl sensorium with  no motor or cerebellar deficits apparent.        CXR PA and Lateral:   02/02/2017 :    I personally reviewed images and agree with radiology impression as follows:     Mediastinum and hilar structures are normal. Mild left base pleuroparenchymal thickening consistent scarring again noted. Superimposed mild infiltrate cannot be excluded. Stable cardiomegaly. No pulmonary venous congestion. Prior cervicothoracic spine fusion.   Labs ordered/ reviewed:      Chemistry      Component Value Date/Time   NA 138 02/02/2017 1436   K 3.1 (L) 02/02/2017 1436   CL 99 02/02/2017 1436   CO2 27 02/02/2017 1436   BUN 18 02/02/2017 1436   CREATININE 1.12 02/02/2017 1436      Component Value Date/Time   CALCIUM 9.5 02/02/2017 1436   ALKPHOS 50 08/30/2016 1453   AST 26 08/30/2016 1453   ALT 21 08/30/2016 1453   BILITOT 0.6 08/30/2016 1453         Lab Results  Component Value Date   WBC 9.6 02/02/2017   HGB 13.6 02/02/2017   HCT 40.2 02/02/2017   MCV 89.5 02/02/2017   PLT 263.0 02/02/2017     Lab Results  Component Value Date   DDIMER 0.42 02/02/2017      Lab Results  Component Value Date   TSH 1.54 02/02/2017  Lab Results  Component Value Date   PROBNP 71.0 02/02/2017       Lab Results  Component Value Date   ESRSEDRATE 3 02/02/2017                Assessment & Plan:

## 2017-02-02 NOTE — Patient Instructions (Addendum)
To get the most out of exercise, you need to be continuously aware that you are short of breath, but never out of breath, for 30 minutes daily. As you improve, it will actually be easier for you to do the same amount of exercise  in  30 minutes so always push to the level where you are short of breath.    Plan B = Backup Only use your albuterol (proair)  as a rescue medication to be used if you can't catch your breath by resting or doing a relaxed purse lip breathing pattern.  - The less you use it, the better it will work when you need it. - Ok to use the inhaler up to 2 puffs  every 4 hours if you must but call for appointment if use goes up over your usual need - Don't leave home without it !!  (think of it like the spare tire for your car)   Plan C = Crisis - only use your albuterol nebulizer if you first try Plan B and it fails to help > ok to use the nebulizer up to every 4 hours but if start needing it regularly call for immediate appointment   See calendar for specific medication instructions and bring it back for each and every office visit for every healthcare provider you see.  Without it,  you may not receive the best quality medical care that we feel you deserve.  You will note that the calendar groups together  your maintenance  medications that are timed at particular times of the day.  Think of this as your checklist for what your doctor has instructed you to do until your next evaluation to see what benefit  there is  to staying on a consistent group of medications intended to keep you well.  The other group at the bottom is entirely up to you to use as you see fit  for specific symptoms that may arise between visits that require you to treat them on an as needed basis.  Think of this as your action plan or "what if" list.   Separating the top medications from the bottom group is fundamental to providing you adequate care going forward.     Please schedule a follow up visit in 3  months but call sooner if needed  with all medications /inhalers/ solutions in hand so we can verify exactly what you are taking. This includes all medications from all doctors and over the counters

## 2017-02-02 NOTE — Progress Notes (Signed)
Spoke with pt and notified of results per Dr. Wert. Pt verbalized understanding and denied any questions. 

## 2017-02-02 NOTE — Progress Notes (Signed)
PFT done today. 

## 2017-02-02 NOTE — Assessment & Plan Note (Addendum)
Onset Nov 2017 - 10/06/2016   Walked RA x one lap @ 185 stopped due to  Sob no desat, slow pace, some dry cough  - FENO 10/06/2016  =   20  Off symbicort  - Allergy profile No visit date found. >  Eos 0.1 /  IgE  < 2 neg RAST - Spirometry 10/06/2016  FEV1 1.48 (64%)  Ratio 83 with min curvature in effort indep portion of f/v loop - trial of max rx for gerd 10/06/2016 > no better - CT chest w/o contrast:  Minimal scarring bases / old compression fx T12 - PFT's  02/02/2017  FEV1 1.69 (79 % ) ratio 86  p 0 % improvement from saba p nothing prior to study with DLCO  64/63  % corrects to 100  % for alv volume - ERV 41% 02/02/2017  C/w effects of obesity  - 02/02/2017   Walked RA  2 laps @ 185 ft each stopped due to  Sob at fast pace/ sats 98% at end and note recovered nicely s any saba once she sat down     Symptoms are markedly disproportionate to objective findings and not clear this is actually much of a  lung problem but pt does appear to have difficult to sort out respiratory symptoms of unknown origin for which  DDX  = almost all start with A and  include Adherence, Ace Inhibitors, Acid Reflux, Active Sinus Disease, Alpha 1 Antitripsin deficiency, Anxiety masquerading as Airways dz,  ABPA,  Allergy(esp in young), Aspiration (esp in elderly), Adverse effects of meds,  Active smokers, A bunch of PE's (a small clot burden can't cause this syndrome unless there is already severe underlying pulm or vascular dz with poor reserve) plus two Bs  = Bronchiectasis and Beta blocker use..and one C= CHF     Adherence is always the initial "prime suspect" and is a multilayered concern that requires a "trust but verify" approach in every patient - starting with knowing how to use medications, especially inhalers, correctly, keeping up with refills and understanding the fundamental difference between maintenance and prns vs those medications only taken for a very short course and then stopped and not refilled.  - not using  med calendar - - The proper method of use, as well as anticipated side effects, of a metered-dose inhaler are discussed and demonstrated to the patient.    ? Acid (or non-acid) GERD > always difficult to exclude as up to 75% of pts in some series report no assoc GI/ Heartburn symptoms> rec continue max (24h)  acid suppression and diet restrictions/ reviewed     ? Allergy/ asthma  > no evidence of variability/ noct symptoms or cough so seems very unlikley  ? Anxiety > usually at the bottom of this list of usual suspects but should be much higher on this pt's based on H and P and note already on psychotropics .   ? A bunch of pe's > D dimer nl - while  A nl valute  may miss small peripheral pe, the clot burden with sob is moderately high and the d dimer has a very high neg pred value in this setting     ? chf >  Very unlikely with bnp so low     At this point she just needs rehab/ paced ex, and prn saba if not recovering at rest from doe related to obesity/deconditioning/ anxiety  I had an extended discussion with the patient reviewing all relevant studies  completed to date and  lasting 25 minutes of a 40  minute office visit / transition of care post hosp    re  severe non-specific but potentially very serious refractory respiratory symptoms of uncertain and potentially multiple  etiologies.    Each maintenance medication was reviewed in detail including most importantly the difference between maintenance and as needed and under what circumstances the prns are to be used. This was done in the context of a newly generated medication calendar/review which provided the patient with a user-friendly unambiguous mechanism for medication administration and reconciliation and provides an action plan for all active problems. It is critical that this be shown to every doctor  for modification during the office visit if necessary so the patient can use it as a working document.

## 2017-02-03 ENCOUNTER — Other Ambulatory Visit: Payer: Self-pay

## 2017-02-03 ENCOUNTER — Encounter: Payer: Self-pay | Admitting: Gastroenterology

## 2017-02-03 ENCOUNTER — Ambulatory Visit (INDEPENDENT_AMBULATORY_CARE_PROVIDER_SITE_OTHER): Payer: Medicare Other | Admitting: Gastroenterology

## 2017-02-03 VITALS — BP 121/87 | HR 75 | Temp 97.1°F | Ht 63.0 in | Wt 211.2 lb

## 2017-02-03 DIAGNOSIS — R1310 Dysphagia, unspecified: Secondary | ICD-10-CM | POA: Diagnosis not present

## 2017-02-03 DIAGNOSIS — R197 Diarrhea, unspecified: Secondary | ICD-10-CM | POA: Diagnosis not present

## 2017-02-03 LAB — D-DIMER, QUANTITATIVE (NOT AT ARMC): D-Dimer, Quant: 0.42 mcg/mL FEU (ref <0.50)

## 2017-02-03 MED ORDER — PEG 3350-KCL-NA BICARB-NACL 420 G PO SOLR: 4000.0000 mL | ORAL | 0 refills | Status: DC

## 2017-02-03 MED ORDER — HYOSCYAMINE SULFATE ER 0.375 MG PO TB12: 0.3750 mg | ORAL_TABLET | Freq: Two times a day (BID) | ORAL | 3 refills | Status: DC

## 2017-02-03 NOTE — Progress Notes (Signed)
Referring Provider: Octavio Graves, DO Primary Care Physician:  Octavio Graves, DO Primary GI: Dr. Gala Romney   Chief Complaint  Patient presents with  . Abdominal Pain    reschedule procedures  . Diarrhea  . Nausea  . Emesis    HPI:   Michelle Zavala is a 71 y.o. female presenting today with a history of chronic diarrhea, chronic abdominal pain, history of colonoscopy in 2016 by Dr. Britta Mccreedy but no colonic biopsies at that time or evaluation of TI after review of records. EGD Nov 2016 with only reactive gastropathy found. Negative H.pylori. 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 Progression of SMA disease could cause issues, but clinical monitoring recommended per IR. She was seen by Vascular Surgery due to renal artery disease as well. Medical therapy recommended. Symptomatic fat-containing ventral hernia noted at visit in March 2017, undergoing hernia repair in March 2017. No weight loss. Chronic diarrhea. Negative hydrogen breath test for small bowel bacterial overgrowth. CT 01/12/16 showed a 10 cm length of large bowel narrowing in the region of the hepatic flexure, may be transient. No mass. Single contrast barium enema recommended to confirm the absence of a fixed structural lesion. Barium enema was normal. She has had another CT as of Oct 2017 without any acute findings. She had been scheduled for a colonoscopy with random colonic biopsies but had to cancel due to pneumonia. She also will be scheduled for an EGD due to dysphagia; however, BPE in Jan 2018 noted age-related dysmotility, otherwise negative. 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  Diarrhea: No improvement with Bentyl or imodium. Everything she eats goes right through her. Thinks she has tried Levsin. Stools sometimes a few and sometimes "half a day". [employer] food "works on me". Cookies don't bother her. Coke doesn't bother her. Doesn't eat  lunch. Usually eats just one meal a day then snacks the rest of the time. No rectal bleeding. Notes vague lower abdominal pain related to bowel habits. Sometimes has to vomit after having a BM. Has to holler for her husband.   Dysphagia: still problems with solid food and liquid. Feels like food just wants to lay in upper abdomen. Protonix BID.     Past Medical History:  Diagnosis Date  . Anxiety   . Arthritis   . C2 cervical fracture ([place]) 09/17/13   Traumatic fracture witth minimal displacement  . Chronic lung disease    Fibrosis - Dr. Koleen Nimrod  . COPD (chronic obstructive pulmonary disease) ([place])   . Coronary atherosclerosis of native coronary artery    Mild atherosclerosis 3/10, LVEF 60-65%  . Depression   . Diverticulosis   . Essential hypertension, benign   . GERD (gastroesophageal reflux disease)   . PSVT (paroxysmal supraventricular tachycardia) ([place])     Past Surgical History:  Procedure Laterality Date  . ABDOMINAL HYSTERECTOMY    . ANTERIOR RELEASE VERTEBRAL BODY W/ POSTERIOR FUSION    . BACTERIAL OVERGROWTH TEST N/A 02/19/2016   Procedure: BACTERIAL OVERGROWTH TEST;  Surgeon: Daneil Dolin, MD;  Location: [facility];  Service: Endoscopy;  Laterality: N/A;  0700  . BIOPSY N/A 08/04/2015   Procedure: BIOPSY;  Surgeon: Daneil Dolin, MD;  Location: [facility];  Service: Endoscopy;  Laterality: N/A;  Gastric  . CESAREAN SECTION    . CHOLECYSTECTOMY  1973  . COLONOSCOPY  June 2016   Dr. Britta Mccreedy: moderate diverticulosis in sigmoid  colon, surveillance in 5 years   . ESOPHAGOGASTRODUODENOSCOPY (EGD) WITH PROPOFOL N/A 08/04/2015   Dr. Rourk:abnormal gastric mucosa s/p biopsy. Reactive gastropathy. Negative H.pylori  . INCISIONAL HERNIA REPAIR N/A 12/03/2015   Procedure: [name] HERNIORRHAPHY WITH MESH;  Surgeon: Aviva Signs, MD;  Location: [facility];  Service: General;  Laterality: N/A;  . INSERTION OF MESH  12/03/2015   Procedure: INSERTION OF MESH;  Surgeon: Aviva Signs,  MD;  Location: [facility];  Service: General;;  . TMJ ARTHROSCOPY Bilateral 02/04/2015   Procedure: BILATERAL TEMPOROMANDIBULAR JOINT (TMJ) ARTHROSCOPY MENISECTOMY WITH FAT GRAFT FROM ABDOMEN ;  Surgeon: Diona Browner, DDS;  Location: [facility];  Service: Oral Surgery;  Laterality: Bilateral;  . TOTAL KNEE ARTHROPLASTY Bilateral   . TUBAL LIGATION      Current Outpatient Prescriptions  Medication Sig Dispense Refill  . acetaminophen (TYLENOL) 650 MG CR tablet Take 650 mg by mouth every 8 (eight) hours as needed for pain.    [name] albuterol (PROAIR HFA) 108 (90 Base) MCG/ACT inhaler Inhale 2 puffs into the lungs every 4 (four) hours as needed for wheezing or shortness of breath.    [name] albuterol (PROVENTIL) (2.5 MG/3ML) 0.083% nebulizer solution Take 2.5 mg by nebulization every 4 (four) hours as needed for wheezing or shortness of breath.    . alfuzosin (UROXATRAL) 10 MG 24 hr tablet Take 10 mg by mouth daily with breakfast.    . amitriptyline (ELAVIL) 75 MG tablet Take 75 mg by mouth at bedtime.  6  . budesonide-formoterol (SYMBICORT) 160-4.5 MCG/ACT inhaler Inhale 2 puffs into the lungs as needed.     . fluticasone (FLONASE) 50 MCG/ACT nasal spray Place 2 sprays into both nostrils daily as needed for allergies or rhinitis.    . hydrochlorothiazide (HYDRODIURIL) 25 MG tablet Take 25 mg by mouth daily.    [name] LORazepam (ATIVAN) 2 MG tablet Take 2 mg by mouth 3 (three) times daily. Take 2 mg by mouth in the morning, 2 mg at noon, and 4 mg at bedtime    . meclizine (ANTIVERT) 25 MG tablet Take 25 mg by mouth daily as needed for dizziness.    . metoprolol succinate (TOPROL-XL) 100 MG 24 hr tablet One half twice daily    . ondansetron (ZOFRAN) 4 MG tablet Take 4 mg by mouth every 8 (eight) hours as needed for nausea or vomiting.    . pantoprazole (PROTONIX) 40 MG tablet Take 1 tablet (40 mg total) by mouth 2 (two) times daily. 60 tablet 1  . potassium chloride SA (KLOR-CON M20) 20 MEQ tablet Take 2 tablets (40 mEq  total) by mouth daily. 60 tablet 2  . sertraline (ZOLOFT) 100 MG tablet Take 100 mg by mouth at bedtime.     . Vitamin D, Ergocalciferol, (DRISDOL) 50000 units CAPS capsule Take 50,000 Units by mouth every 7 (seven) days.     No current facility-administered medications for this visit.     Allergies as of 02/03/2017 - Review Complete 02/03/2017  Allergen Reaction Noted  . Sulfonamide derivatives Hives     Family History  Problem Relation Age of Onset  . Coronary artery disease Father        Premature  . Heart disease Father   . Coronary artery disease Brother        Premature  . Coronary artery disease Sister        Premature  . Colon cancer Neg Hx     Social History   Social History  . Marital  status: Widowed    Spouse name: N/A  . Number of children: 3  . Years of education: 7   Occupational History  . Retired    Social History Main Topics  . Smoking status: Never Smoker  . Smokeless tobacco: Never Used  . Alcohol use No  . Drug use: No  . Sexual activity: No   Other Topics Concern  . None   Social History Narrative   Occasionally drinks [employer]     Review of Systems: Gen: Denies fever, chills, anorexia. Denies fatigue, weakness, weight loss.  CV: Denies chest pain, palpitations, syncope, peripheral edema, and claudication. Resp: +DOE  GI: see HPI  Derm: Denies rash, itching, dry skin Psych: mild depression/anxiety  Heme: Denies bruising, bleeding, and enlarged lymph nodes.  Physical Exam: BP 121/87   Pulse 75   Temp 97.1 F (36.2 C) (Oral)   Ht 5\' 3"  (1.6 m)   Wt 211 lb 3.2 oz (95.8 kg)   BMI 37.41 kg/m  General:   Alert and oriented. No distress noted. Pleasant and cooperative.  Head:  Normocephalic and atraumatic. Eyes:  Conjuctiva clear without scleral icterus. Mouth:  Oral mucosa pink and moist. Good dentition. No lesions. Heart:  S1, S2 present without murmurs, rubs, or gallops. Regular rate and rhythm. Abdomen:  +BS, soft, non-tender and  non-distended. No rebound or guarding. No HSM or masses noted. Msk:  Symmetrical without gross deformities. Normal posture. Extremities:  Without edema. Neurologic:  Alert and  oriented x4;  grossly normal neurologically. Psych:  Alert and cooperative. Normal mood and affect.

## 2017-02-03 NOTE — Assessment & Plan Note (Signed)
71 year old female with history of chronic diarrhea, chronic abdominal pain, thoroughly evaluated as noted in HPI. Weight stable. History of colonoscopy in 2016 by Dr. Britta Mccreedy but no colonic biopsies at that time or evaluation of TI. No improvement with Bentyl or Imodium, and she is not a candidate for Viberzi due to absent gallbladder. Question IBS, +/- bile salt diarrhea, unable to exclude microscopic colitis. Doubt pancreatic insufficiency but could remain in differential. No prior colonoscopy by our practice, so we will arrange a colonoscopy with colonic biopsies for further evaluation.   Proceed with TCS with Dr. Gala Romney in near future: the risks, benefits, and alternatives have been discussed with the patient in detail. The patient states understanding and desires to proceed. PROPOFOL due to polypharmacy Trial of Levbid BID Return in 3 months Celiac serologies ordered as I do not believe this has been completed

## 2017-02-03 NOTE — Assessment & Plan Note (Addendum)
ERV 41% 02/02/2017  C/w effects of obesity   Body mass index is 37.73 kg/m.  -  trending down > encouraged  Lab Results  Component Value Date   TSH 1.54 02/02/2017     Contributing to gerd risk/ doe/reviewed the need and the process to achieve and maintain neg calorie balance > defer f/u primary care including intermittently monitoring thyroid status

## 2017-02-03 NOTE — Progress Notes (Signed)
cc'ed to pcp °

## 2017-02-03 NOTE — Patient Instructions (Addendum)
We have scheduled you for a colonoscopy and upper endoscopy with dilation by Dr. Gala Romney.   For abdominal cramping: start taking Levbid twice a day.   Please have blood work done.  We will see you in 3 months.

## 2017-02-03 NOTE — Assessment & Plan Note (Signed)
With AB a possibility mimicking "aecopd" ideally should pick a more selective BB but if lopressor is used best to change from 100 mg daily to 50 mg bid > reviewed with pt and entered this way on her med calendar

## 2017-02-03 NOTE — Assessment & Plan Note (Signed)
Last EGD in Nov 2016 with reactive gastropathy. BPE without significant findings other than age-related dysmotility, and this is likely the culprit. She is requesting an EGD at time of colonoscopy, which we will pursue. 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  Proceed with upper endoscopy/dilation in the near future with Dr. Gala Romney. The risks, benefits, and alternatives have been discussed in detail with patient. They have stated understanding and desire to proceed.  PROPOFOL due to polypharmacy Continue Protonix BID

## 2017-02-04 LAB — TISSUE TRANSGLUTAMINASE, IGA: Tissue Transglutaminase Ab, IgA: 1 U/mL (ref <4)

## 2017-02-04 LAB — IGA: IgA: 205 mg/dL (ref 81–463)

## 2017-02-08 NOTE — Progress Notes (Signed)
Celiac serologies negative. Procedures as planned.

## 2017-02-15 ENCOUNTER — Other Ambulatory Visit: Payer: Self-pay

## 2017-02-15 ENCOUNTER — Telehealth: Payer: Self-pay

## 2017-02-15 NOTE — Telephone Encounter (Signed)
Pt called and she is aware of her blood results. She is wanting to know if we can move her TCS up. I told her that I could move her to Monday 02/21/17 @ 11:45 am.

## 2017-02-16 NOTE — Patient Instructions (Signed)
Michelle Zavala  02/16/2017     @PREFPERIOPPHARMACY @   Your procedure is scheduled on  02/21/2017   Report to [facility] at  1015  A.M.  Call this number if you have problems the morning of surgery:  [phone]   Remember:  Do not eat food or drink liquids after midnight.  Take these medicines the morning of surgery with A SIP OF WATER  Antivert, zofran, protonix, zoloft.   Do not wear jewelry, make-up or nail polish.  Do not wear lotions, powders, or perfumes, or deoderant.  Do not shave 48 hours prior to surgery.  Men may shave face and neck.  Do not bring valuables to the hospital.  [facility] is not responsible for any belongings or valuables.  Contacts, dentures or bridgework may not be worn into surgery.  Leave your suitcase in the car.  After surgery it may be brought to your room.  For patients admitted to the hospital, discharge time will be determined by your treatment team.  Patients discharged the day of surgery will not be allowed to drive home.   Name and phone number of your driver:   family Special instructions:  Follow the diet and prep instructions given to you by Dr Roseanne Kaufman office.  Please read over the following fact sheets that you were given. Anesthesia Post-op Instructions and Care and Recovery After Surgery       Esophagogastroduodenoscopy Esophagogastroduodenoscopy (EGD) is a procedure to examine the lining of the esophagus, stomach, and first part of the small intestine (duodenum). This procedure is done to check for problems such as inflammation, bleeding, ulcers, or growths. During this procedure, a long, flexible, lighted tube with a camera attached (endoscope) is inserted down the throat. Tell a health care provider about:  Any allergies you have.  All medicines you are taking, including vitamins, herbs, eye drops, creams, and over-the-counter medicines.  Any problems you or family members have had with  anesthetic medicines.  Any blood disorders you have.  Any surgeries you have had.  Any medical conditions you have.  Whether you are pregnant or may be pregnant. What are the risks? Generally, this is a safe procedure. However, problems may occur, including:  Infection.  Bleeding.  A tear (perforation) in the esophagus, stomach, or duodenum.  Trouble breathing.  Excessive sweating.  Spasms of the larynx.  A slowed heartbeat.  Low blood pressure.  What happens before the procedure?  Follow instructions from your health care provider about eating or drinking restrictions.  Ask your health care provider about: ? Changing or stopping your regular medicines. This is especially important if you are taking diabetes medicines or blood thinners. ? Taking medicines such as aspirin and ibuprofen. These medicines can thin your blood. Do not take these medicines before your procedure if your health care provider instructs you not to.  Plan to have someone take you home after the procedure.  If you wear dentures, be ready to remove them before the procedure. What happens during the procedure?  To reduce your risk of infection, your health care team will wash or sanitize their hands.  An IV tube will be put in a vein in your hand or arm. You will get medicines and fluids through this tube.  You will be given one or more of the following: ? A medicine to help you relax (sedative). ? A medicine to numb the  area (local anesthetic). This medicine may be sprayed into your throat. It will make you feel more comfortable and keep you from gagging or coughing during the procedure. ? A medicine for pain.  A mouth guard may be placed in your mouth to protect your teeth and to keep you from biting on the endoscope.  You will be asked to lie on your left side.  The endoscope will be lowered down your throat into your esophagus, stomach, and duodenum.  Air will be put into the endoscope.  This will help your health care provider see better.  The lining of your esophagus, stomach, and duodenum will be examined.  Your health care provider may: ? Take a tissue sample so it can be looked at in a lab (biopsy). ? Remove growths. ? Remove objects (foreign bodies) that are stuck. ? Treat any bleeding with medicines or other devices that stop tissue from bleeding. ? Widen (dilate) or stretch narrowed areas of your esophagus and stomach.  The endoscope will be taken out. The procedure may vary among health care providers and hospitals. What happens after the procedure?  Your blood pressure, heart rate, breathing rate, and blood oxygen level will be monitored often until the medicines you were given have worn off.  Do not eat or drink anything until the numbing medicine has worn off and your gag reflex has returned. This information is not intended to replace advice given to you by your health care provider. Make sure you discuss any questions you have with your health care provider. Document Released: 12/31/2004 Document Revised: 02/05/2016 Document Reviewed: 07/24/2015 Elsevier Interactive Patient Education  2018 [employer]. Esophagogastroduodenoscopy, Care After Refer to this sheet in the next few weeks. These instructions provide you with information about caring for yourself after your procedure. Your health care provider may also give you more specific instructions. Your treatment has been planned according to current medical practices, but problems sometimes occur. Call your health care provider if you have any problems or questions after your procedure. What can I expect after the procedure? After the procedure, it is common to have:  A sore throat.  Nausea.  Bloating.  Dizziness.  Fatigue.  Follow these instructions at home:  Do not eat or drink anything until the numbing medicine (local anesthetic) has worn off and your gag reflex has returned. You will know  that the local anesthetic has worn off when you can swallow comfortably.  Do not drive for 24 hours if you received a medicine to help you relax (sedative).  If your health care provider took a tissue sample for testing during the procedure, make sure to get your test results. This is your responsibility. Ask your health care provider or the department performing the test when your results will be ready.  Keep all follow-up visits as told by your health care provider. This is important. Contact a health care provider if:  You cannot stop coughing.  You are not urinating.  You are urinating less than usual. Get help right away if:  You have trouble swallowing.  You cannot eat or drink.  You have throat or chest pain that gets worse.  You are dizzy or light-headed.  You faint.  You have nausea or vomiting.  You have chills.  You have a fever.  You have severe abdominal pain.  You have black, tarry, or bloody stools. This information is not intended to replace advice given to you by your health care provider. Make sure you  discuss any questions you have with your health care provider. Document Released: 08/16/2012 Document Revised: 02/05/2016 Document Reviewed: 07/24/2015 Elsevier Interactive Patient Education  2018 [employer].  Esophageal Dilatation Esophageal dilatation is a procedure to open a blocked or narrowed part of the esophagus. The esophagus is the long tube in your throat that carries food and liquid from your mouth to your stomach. The procedure is also called esophageal dilation. You may need this procedure if you have a buildup of scar tissue in your esophagus that makes it difficult, painful, or even impossible to swallow. This can be caused by gastroesophageal reflux disease (GERD). In rare cases, people need this procedure because they have cancer of the esophagus or a problem with the way food moves through the esophagus. Sometimes you may need to have  another dilatation to enlarge the opening of the esophagus gradually. Tell a health care provider about:  Any allergies you have.  All medicines you are taking, including vitamins, herbs, eye drops, creams, and over-the-counter medicines.  Any problems you or family members have had with anesthetic medicines.  Any blood disorders you have.  Any surgeries you have had.  Any medical conditions you have.  Any antibiotic medicines you are required to take before dental procedures. What are the risks? Generally, this is a safe procedure. However, problems can occur and include:  Bleeding from a tear in the lining of the esophagus.  A hole (perforation) in the esophagus.  What happens before the procedure?  Do not eat or drink anything after midnight on the night before the procedure or as directed by your health care provider.  Ask your health care provider about changing or stopping your regular medicines. This is especially important if you are taking diabetes medicines or blood thinners.  Plan to have someone take you home after the procedure. What happens during the procedure?  You will be given a medicine that makes you relaxed and sleepy (sedative).  A medicine may be sprayed or gargled to numb the back of the throat.  Your health care provider can use various instruments to do an esophageal dilatation. During the procedure, the instrument used will be placed in your mouth and passed down into your esophagus. Options include: ? Simple dilators. This instrument is carefully placed in the esophagus to stretch it. ? Guided wire bougies. In this method, a flexible tube (endoscope) is used to insert a wire into the esophagus. The dilator is passed over this wire to enlarge the esophagus. Then the wire is removed. ? Balloon dilators. An endoscope with a small balloon at the end is passed down into the esophagus. Inflating the balloon gently stretches the esophagus and opens it  up. What happens after the procedure?  Your blood pressure, heart rate, breathing rate, and blood oxygen level will be monitored often until the medicines you were given have worn off.  Your throat may feel slightly sore and will probably still feel numb. This will improve slowly over time.  You will not be allowed to eat or drink until the throat numbness has resolved.  If this is a same-day procedure, you may be allowed to go home once you have been able to drink, urinate, and sit on the edge of the bed without nausea or dizziness.  If this is a same-day procedure, you should have a friend or family member with you for the next 24 hours after the procedure. This information is not intended to replace advice given to you  by your health care provider. Make sure you discuss any questions you have with your health care provider. Document Released: 10/21/2005 Document Revised: 02/05/2016 Document Reviewed: 01/09/2014 Elsevier Interactive Patient Education  2017 [employer].  Colonoscopy, Adult A colonoscopy is an exam to look at the entire large intestine. During the exam, a lubricated, bendable tube is inserted into the anus and then passed into the rectum, colon, and other parts of the large intestine. A colonoscopy is often done as a part of normal colorectal screening or in response to certain symptoms, such as anemia, persistent diarrhea, abdominal pain, and blood in the stool. The exam can help screen for and diagnose medical problems, including:  Tumors.  Polyps.  Inflammation.  Areas of bleeding.  Tell a health care provider about:  Any allergies you have.  All medicines you are taking, including vitamins, herbs, eye drops, creams, and over-the-counter medicines.  Any problems you or family members have had with anesthetic medicines.  Any blood disorders you have.  Any surgeries you have had.  Any medical conditions you have.  Any problems you have had passing  stool. What are the risks? Generally, this is a safe procedure. However, problems may occur, including:  Bleeding.  A tear in the intestine.  A reaction to medicines given during the exam.  Infection (rare).  What happens before the procedure? Eating and drinking restrictions Follow instructions from your health care provider about eating and drinking, which may include:  A few days before the procedure - follow a low-fiber diet. Avoid nuts, seeds, dried fruit, raw fruits, and vegetables.  1-3 days before the procedure - follow a clear liquid diet. Drink only clear liquids, such as clear broth or bouillon, black coffee or tea, clear juice, clear soft drinks or sports drinks, gelatin dessert, and popsicles. Avoid any liquids that contain red or purple dye.  On the day of the procedure - do not eat or drink anything during the 2 hours before the procedure, or within the time period that your health care provider recommends.  Bowel prep If you were prescribed an oral bowel prep to clean out your colon:  Take it as told by your health care provider. Starting the day before your procedure, you will need to drink a large amount of medicated liquid. The liquid will cause you to have multiple loose stools until your stool is almost clear or light green.  If your skin or anus gets irritated from diarrhea, you may use these to relieve the irritation: ? Medicated wipes, such as adult wet wipes with aloe and vitamin E. ? A skin soothing-product like petroleum jelly.  If you vomit while drinking the bowel prep, take a break for up to 60 minutes and then begin the bowel prep again. If vomiting continues and you cannot take the bowel prep without vomiting, call your health care provider.  General instructions  Ask your health care provider about changing or stopping your regular medicines. This is especially important if you are taking diabetes medicines or blood thinners.  Plan to have someone  take you home from the hospital or clinic. What happens during the procedure?  An IV tube may be inserted into one of your veins.  You will be given medicine to help you relax (sedative).  To reduce your risk of infection: ? Your health care team will wash or sanitize their hands. ? Your anal area will be washed with soap.  You will be asked to lie on your  side with your knees bent.  Your health care provider will lubricate a long, thin, flexible tube. The tube will have a camera and a light on the end.  The tube will be inserted into your anus.  The tube will be gently eased through your rectum and colon.  Air will be delivered into your colon to keep it open. You may feel some pressure or cramping.  The camera will be used to take images during the procedure.  A small tissue sample may be removed from your body to be examined under a microscope (biopsy). If any potential problems are found, the tissue will be sent to a lab for testing.  If small polyps are found, your health care provider may remove them and have them checked for cancer cells.  The tube that was inserted into your anus will be slowly removed. The procedure may vary among health care providers and hospitals. What happens after the procedure?  Your blood pressure, heart rate, breathing rate, and blood oxygen level will be monitored until the medicines you were given have worn off.  Do not drive for 24 hours after the exam.  You may have a small amount of blood in your stool.  You may pass gas and have mild abdominal cramping or bloating due to the air that was used to inflate your colon during the exam.  It is up to you to get the results of your procedure. Ask your health care provider, or the department performing the procedure, when your results will be ready. This information is not intended to replace advice given to you by your health care provider. Make sure you discuss any questions you have with your  health care provider. Document Released: 08/27/2000 Document Revised: 06/30/2016 Document Reviewed: 11/11/2015 Elsevier Interactive Patient Education  2018 [employer].  Colonoscopy, Adult, Care After This sheet gives you information about how to care for yourself after your procedure. Your health care provider may also give you more specific instructions. If you have problems or questions, contact your health care provider. What can I expect after the procedure? After the procedure, it is common to have:  A small amount of blood in your stool for 24 hours after the procedure.  Some gas.  Mild abdominal cramping or bloating.  Follow these instructions at home: General instructions   For the first 24 hours after the procedure: ? Do not drive or use machinery. ? Do not sign important documents. ? Do not drink alcohol. ? Do your regular daily activities at a slower pace than normal. ? Eat soft, easy-to-digest foods. ? Rest often.  Take over-the-counter or prescription medicines only as told by your health care provider.  It is up to you to get the results of your procedure. Ask your health care provider, or the department performing the procedure, when your results will be ready. Relieving cramping and bloating  Try walking around when you have cramps or feel bloated.  Apply heat to your abdomen as told by your health care provider. Use a heat source that your health care provider recommends, such as a moist heat pack or a heating pad. ? Place a towel between your skin and the heat source. ? Leave the heat on for 20-30 minutes. ? Remove the heat if your skin turns bright red. This is especially important if you are unable to feel pain, heat, or cold. You may have a greater risk of getting burned. Eating and drinking  Drink enough fluid to  keep your urine clear or pale yellow.  Resume your normal diet as instructed by your health care provider. Avoid heavy or fried foods that  are hard to digest.  Avoid drinking alcohol for as long as instructed by your health care provider. Contact a health care provider if:  You have blood in your stool 2-3 days after the procedure. Get help right away if:  You have more than a small spotting of blood in your stool.  You pass large blood clots in your stool.  Your abdomen is swollen.  You have nausea or vomiting.  You have a fever.  You have increasing abdominal pain that is not relieved with medicine. This information is not intended to replace advice given to you by your health care provider. Make sure you discuss any questions you have with your health care provider. Document Released: 04/13/2004 Document Revised: 05/24/2016 Document Reviewed: 11/11/2015 Elsevier Interactive Patient Education  2018 [employer] Anesthesia is a term that refers to techniques, procedures, and medicines that help a person stay safe and comfortable during a medical procedure. Monitored anesthesia care, or sedation, is one type of anesthesia. Your anesthesia specialist may recommend sedation if you will be having a procedure that does not require you to be unconscious, such as:  Cataract surgery.  A dental procedure.  A biopsy.  A colonoscopy.  During the procedure, you may receive a medicine to help you relax (sedative). There are three levels of sedation:  Mild sedation. At this level, you may feel awake and relaxed. You will be able to follow directions.  Moderate sedation. At this level, you will be sleepy. You may not remember the procedure.  Deep sedation. At this level, you will be asleep. You will not remember the procedure.  The more medicine you are given, the deeper your level of sedation will be. Depending on how you respond to the procedure, the anesthesia specialist may change your level of sedation or the type of anesthesia to fit your needs. An anesthesia specialist will monitor you  closely during the procedure. Let your health care provider know about:  Any allergies you have.  All medicines you are taking, including vitamins, herbs, eye drops, creams, and over-the-counter medicines.  Any use of steroids (by mouth or as a cream).  Any problems you or family members have had with sedatives and anesthetic medicines.  Any blood disorders you have.  Any surgeries you have had.  Any medical conditions you have, such as sleep apnea.  Whether you are pregnant or may be pregnant.  Any use of cigarettes, alcohol, or street drugs. What are the risks? Generally, this is a safe procedure. However, problems may occur, including:  Getting too much medicine (oversedation).  Nausea.  Allergic reaction to medicines.  Trouble breathing. If this happens, a breathing tube may be used to help with breathing. It will be removed when you are awake and breathing on your own.  Heart trouble.  Lung trouble.  Before the procedure Staying hydrated Follow instructions from your health care provider about hydration, which may include:  Up to 2 hours before the procedure - you may continue to drink clear liquids, such as water, clear fruit juice, black coffee, and plain tea.  Eating and drinking restrictions Follow instructions from your health care provider about eating and drinking, which may include:  8 hours before the procedure - stop eating heavy meals or foods such as meat, fried foods, or fatty foods.  6 hours before the procedure - stop eating light meals or foods, such as toast or cereal.  6 hours before the procedure - stop drinking milk or drinks that contain milk.  2 hours before the procedure - stop drinking clear liquids.  Medicines Ask your health care provider about:  Changing or stopping your regular medicines. This is especially important if you are taking diabetes medicines or blood thinners.  Taking medicines such as aspirin and ibuprofen. These  medicines can thin your blood. Do not take these medicines before your procedure if your health care provider instructs you not to.  Tests and exams  You will have a physical exam.  You may have blood tests done to show: ? How well your kidneys and liver are working. ? How well your blood can clot.  General instructions  Plan to have someone take you home from the hospital or clinic.  If you will be going home right after the procedure, plan to have someone with you for 24 hours.  What happens during the procedure?  Your blood pressure, heart rate, breathing, level of pain and overall condition will be monitored.  An IV tube will be inserted into one of your veins.  Your anesthesia specialist will give you medicines as needed to keep you comfortable during the procedure. This may mean changing the level of sedation.  The procedure will be performed. After the procedure  Your blood pressure, heart rate, breathing rate, and blood oxygen level will be monitored until the medicines you were given have worn off.  Do not drive for 24 hours if you received a sedative.  You may: ? Feel sleepy, clumsy, or nauseous. ? Feel forgetful about what happened after the procedure. ? Have a sore throat if you had a breathing tube during the procedure. ? Vomit. This information is not intended to replace advice given to you by your health care provider. Make sure you discuss any questions you have with your health care provider. Document Released: 05/26/2005 Document Revised: 02/06/2016 Document Reviewed: 12/21/2015 Elsevier Interactive Patient Education  2018 [employer], Care After These instructions provide you with information about caring for yourself after your procedure. Your health care provider may also give you more specific instructions. Your treatment has been planned according to current medical practices, but problems sometimes occur. Call your health  care provider if you have any problems or questions after your procedure. What can I expect after the procedure? After your procedure, it is common to:  Feel sleepy for several hours.  Feel clumsy and have poor balance for several hours.  Feel forgetful about what happened after the procedure.  Have poor judgment for several hours.  Feel nauseous or vomit.  Have a sore throat if you had a breathing tube during the procedure.  Follow these instructions at home: For at least 24 hours after the procedure:   Do not: ? Participate in activities in which you could fall or become injured. ? Drive. ? Use heavy machinery. ? Drink alcohol. ? Take sleeping pills or medicines that cause drowsiness. ? Make important decisions or sign legal documents. ? Take care of children on your own.  Rest. Eating and drinking  Follow the diet that is recommended by your health care provider.  If you vomit, drink water, juice, or soup when you can drink without vomiting.  Make sure you have little or no nausea before eating solid foods. General instructions  Have a responsible adult stay  with you until you are awake and alert.  Take over-the-counter and prescription medicines only as told by your health care provider.  If you smoke, do not smoke without supervision.  Keep all follow-up visits as told by your health care provider. This is important. Contact a health care provider if:  You keep feeling nauseous or you keep vomiting.  You feel light-headed.  You develop a rash.  You have a fever. Get help right away if:  You have trouble breathing. This information is not intended to replace advice given to you by your health care provider. Make sure you discuss any questions you have with your health care provider. Document Released: 12/21/2015 Document Revised: 04/21/2016 Document Reviewed: 12/21/2015 Elsevier Interactive Patient Education  [employer].

## 2017-02-17 ENCOUNTER — Other Ambulatory Visit: Payer: Self-pay | Admitting: Otolaryngology

## 2017-02-17 ENCOUNTER — Encounter (HOSPITAL_COMMUNITY): Payer: Self-pay

## 2017-02-17 ENCOUNTER — Encounter (HOSPITAL_COMMUNITY)
Admission: RE | Admit: 2017-02-17 | Discharge: 2017-02-17 | Disposition: A | Discharge status: Home / Self Care | Payer: Medicare Other | Source: Ambulatory Visit | Attending: Internal Medicine | Admitting: Internal Medicine

## 2017-02-17 DIAGNOSIS — Z01818 Encounter for other preprocedural examination: Secondary | ICD-10-CM | POA: Diagnosis present

## 2017-02-17 DIAGNOSIS — E041 Nontoxic single thyroid nodule: Secondary | ICD-10-CM

## 2017-02-17 DIAGNOSIS — R197 Diarrhea, unspecified: Secondary | ICD-10-CM | POA: Insufficient documentation

## 2017-02-17 DIAGNOSIS — R1310 Dysphagia, unspecified: Secondary | ICD-10-CM | POA: Insufficient documentation

## 2017-02-17 LAB — SURGICAL PCR SCREEN
MRSA, PCR: NEGATIVE
Staphylococcus aureus: NEGATIVE

## 2017-02-17 LAB — BASIC METABOLIC PANEL
Anion gap: 10 (ref 5–15)
BUN: 16 mg/dL (ref 6–20)
CO2: 26 mmol/L (ref 22–32)
Calcium: 9.4 mg/dL (ref 8.9–10.3)
Chloride: 100 mmol/L — ABNORMAL LOW (ref 101–111)
Creatinine, Ser: 1.13 mg/dL — ABNORMAL HIGH (ref 0.44–1.00)
GFR calc Af Amer: 55 mL/min — ABNORMAL LOW (ref >60)
GFR calc non Af Amer: 48 mL/min — ABNORMAL LOW (ref >60)
Glucose, Bld: 99 mg/dL (ref 65–99)
Potassium: 3.8 mmol/L (ref 3.5–5.1)
Sodium: 136 mmol/L (ref 135–145)

## 2017-02-17 NOTE — Pre-Procedure Instructions (Signed)
Patient did not show for PAT. Attempted to call her but only number listed went straight to voicemail. Left her a message to call us back and reschedule her PAT.

## 2017-02-21 ENCOUNTER — Ambulatory Visit (HOSPITAL_COMMUNITY): Payer: Medicare Other | Admitting: Anesthesiology

## 2017-02-21 ENCOUNTER — Encounter (HOSPITAL_COMMUNITY)
Admission: RE | Disposition: A | Discharge status: Home / Self Care | Payer: Self-pay | Source: Ambulatory Visit | Attending: Internal Medicine

## 2017-02-21 ENCOUNTER — Encounter (HOSPITAL_COMMUNITY): Payer: Self-pay | Admitting: *Deleted

## 2017-02-21 ENCOUNTER — Ambulatory Visit (HOSPITAL_COMMUNITY)
Admission: RE | Admit: 2017-02-21 | Discharge: 2017-02-21 | Disposition: A | Discharge status: Home / Self Care | Payer: Medicare Other | Source: Ambulatory Visit | Attending: Internal Medicine | Admitting: Internal Medicine

## 2017-02-21 DIAGNOSIS — K449 Diaphragmatic hernia without obstruction or gangrene: Secondary | ICD-10-CM | POA: Diagnosis not present

## 2017-02-21 DIAGNOSIS — J449 Chronic obstructive pulmonary disease, unspecified: Secondary | ICD-10-CM | POA: Diagnosis not present

## 2017-02-21 DIAGNOSIS — I2510 Atherosclerotic heart disease of native coronary artery without angina pectoris: Secondary | ICD-10-CM | POA: Insufficient documentation

## 2017-02-21 DIAGNOSIS — G8929 Other chronic pain: Secondary | ICD-10-CM | POA: Diagnosis not present

## 2017-02-21 DIAGNOSIS — R1310 Dysphagia, unspecified: Secondary | ICD-10-CM | POA: Diagnosis not present

## 2017-02-21 DIAGNOSIS — M1990 Unspecified osteoarthritis, unspecified site: Secondary | ICD-10-CM | POA: Insufficient documentation

## 2017-02-21 DIAGNOSIS — M542 Cervicalgia: Secondary | ICD-10-CM | POA: Diagnosis not present

## 2017-02-21 DIAGNOSIS — I739 Peripheral vascular disease, unspecified: Secondary | ICD-10-CM | POA: Diagnosis not present

## 2017-02-21 DIAGNOSIS — I471 Supraventricular tachycardia: Secondary | ICD-10-CM | POA: Diagnosis not present

## 2017-02-21 DIAGNOSIS — D124 Benign neoplasm of descending colon: Secondary | ICD-10-CM | POA: Insufficient documentation

## 2017-02-21 DIAGNOSIS — Z882 Allergy status to sulfonamides status: Secondary | ICD-10-CM | POA: Insufficient documentation

## 2017-02-21 DIAGNOSIS — D123 Benign neoplasm of transverse colon: Secondary | ICD-10-CM | POA: Insufficient documentation

## 2017-02-21 DIAGNOSIS — K529 Noninfective gastroenteritis and colitis, unspecified: Secondary | ICD-10-CM | POA: Diagnosis not present

## 2017-02-21 DIAGNOSIS — Z90710 Acquired absence of both cervix and uterus: Secondary | ICD-10-CM | POA: Insufficient documentation

## 2017-02-21 DIAGNOSIS — K219 Gastro-esophageal reflux disease without esophagitis: Secondary | ICD-10-CM | POA: Diagnosis not present

## 2017-02-21 DIAGNOSIS — R109 Unspecified abdominal pain: Secondary | ICD-10-CM | POA: Diagnosis not present

## 2017-02-21 DIAGNOSIS — F419 Anxiety disorder, unspecified: Secondary | ICD-10-CM | POA: Insufficient documentation

## 2017-02-21 DIAGNOSIS — K5730 Diverticulosis of large intestine without perforation or abscess without bleeding: Secondary | ICD-10-CM | POA: Diagnosis not present

## 2017-02-21 DIAGNOSIS — F329 Major depressive disorder, single episode, unspecified: Secondary | ICD-10-CM | POA: Diagnosis not present

## 2017-02-21 DIAGNOSIS — Z8249 Family history of ischemic heart disease and other diseases of the circulatory system: Secondary | ICD-10-CM | POA: Insufficient documentation

## 2017-02-21 DIAGNOSIS — Z79899 Other long term (current) drug therapy: Secondary | ICD-10-CM | POA: Insufficient documentation

## 2017-02-21 DIAGNOSIS — I10 Essential (primary) hypertension: Secondary | ICD-10-CM | POA: Insufficient documentation

## 2017-02-21 DIAGNOSIS — D122 Benign neoplasm of ascending colon: Secondary | ICD-10-CM | POA: Diagnosis not present

## 2017-02-21 DIAGNOSIS — R197 Diarrhea, unspecified: Secondary | ICD-10-CM

## 2017-02-21 HISTORY — PX: BIOPSY: SHX5522

## 2017-02-21 HISTORY — PX: MALONEY DILATION: SHX5535

## 2017-02-21 HISTORY — PX: POLYPECTOMY: SHX5525

## 2017-02-21 HISTORY — PX: ESOPHAGOGASTRODUODENOSCOPY (EGD) WITH PROPOFOL: SHX5813

## 2017-02-21 HISTORY — PX: COLONOSCOPY WITH PROPOFOL: SHX5780

## 2017-02-21 SURGERY — COLONOSCOPY WITH PROPOFOL
Anesthesia: Monitor Anesthesia Care

## 2017-02-21 MED ORDER — CHLORHEXIDINE GLUCONATE CLOTH 2 % EX PADS: 6.0000 | MEDICATED_PAD | Freq: Once | CUTANEOUS | Status: DC

## 2017-02-21 MED ORDER — FENTANYL CITRATE (PF) 100 MCG/2ML IJ SOLN
INTRAMUSCULAR | Status: AC
  Filled 2017-02-21: qty 2

## 2017-02-21 MED ORDER — MIDAZOLAM HCL 2 MG/2ML IJ SOLN
INTRAMUSCULAR | Status: AC
  Filled 2017-02-21: qty 2

## 2017-02-21 MED ORDER — PROPOFOL 500 MG/50ML IV EMUL
INTRAVENOUS | Status: DC | PRN
  Administered 2017-02-21: 125 ug/kg/min via INTRAVENOUS

## 2017-02-21 MED ORDER — LIDOCAINE VISCOUS 2 % MT SOLN
5.0000 mL | Freq: Once | OROMUCOSAL | Status: AC
  Administered 2017-02-21: 5 mL via OROMUCOSAL

## 2017-02-21 MED ORDER — MIDAZOLAM HCL 2 MG/2ML IJ SOLN
1.0000 mg | INTRAMUSCULAR | Status: AC
  Administered 2017-02-21: 2 mg via INTRAVENOUS

## 2017-02-21 MED ORDER — FENTANYL CITRATE (PF) 100 MCG/2ML IJ SOLN
25.0000 ug | Freq: Once | INTRAMUSCULAR | Status: AC
  Administered 2017-02-21: 25 ug via INTRAVENOUS

## 2017-02-21 MED ORDER — LACTATED RINGERS IV SOLN
INTRAVENOUS | Status: DC
  Administered 2017-02-21: 11:00:00 via INTRAVENOUS

## 2017-02-21 MED ORDER — PROPOFOL 10 MG/ML IV BOLUS
INTRAVENOUS | Status: DC | PRN
  Administered 2017-02-21 (×4): 10 mg via INTRAVENOUS

## 2017-02-21 MED ORDER — LIDOCAINE VISCOUS 2 % MT SOLN
OROMUCOSAL | Status: AC
  Filled 2017-02-21: qty 15

## 2017-02-21 MED ORDER — PROPOFOL 10 MG/ML IV BOLUS
INTRAVENOUS | Status: AC
  Filled 2017-02-21: qty 40

## 2017-02-21 MED ORDER — IPRATROPIUM-ALBUTEROL 0.5-2.5 (3) MG/3ML IN SOLN
RESPIRATORY_TRACT | Status: AC
  Filled 2017-02-21: qty 3

## 2017-02-21 MED ORDER — IPRATROPIUM-ALBUTEROL 0.5-2.5 (3) MG/3ML IN SOLN
3.0000 mL | Freq: Once | RESPIRATORY_TRACT | Status: AC
  Administered 2017-02-21: 3 mL via RESPIRATORY_TRACT

## 2017-02-21 NOTE — Interval H&P Note (Signed)
History and Physical Interval Note:  02/21/2017 10:57 AM  Michelle Zavala  has presented today for surgery, with the diagnosis of DYSPHAGIA/DIARRHEA  The various methods of treatment have been discussed with the patient and family. After consideration of risks, benefits and other options for treatment, the patient has consented to  Procedure(s) with comments: COLONOSCOPY WITH PROPOFOL (N/A) - 900 ESOPHAGOGASTRODUODENOSCOPY (EGD) WITH PROPOFOL (N/A) MALONEY DILATION (N/A) as a surgical intervention .  The patient's history has been reviewed, patient examined, no change in status, stable for surgery.  I have reviewed the patient's chart and labs.  Questions were answered to the patient's satisfaction.     Michelle Zavala  No change. History of C2 fracture. Discuss with anesthesia. EGD with ED as feasible/appropriate and diagnostic colonoscopy per plan.

## 2017-02-21 NOTE — Op Note (Signed)
[facility] Patient Name: Michelle Zavala Procedure Date: 02/21/2017 11:18 AM MRN: [id number] Date of Birth: Jul 15, 1946 Attending MD: Norvel Richards , MD CSN: [id number] Age: 71 Admit Type: Outpatient Procedure:                Colonoscopy Indications:              Chronic diarrhea Providers:                Norvel Richards, MD, Janeece Riggers, RN, Aram Candela Referring MD:              Medicines:                Propofol per Anesthesia Complications:            No immediate complications. Estimated Blood Loss:     Estimated blood loss was minimal. Procedure:                Pre-Anesthesia Assessment:                           - Prior to the procedure, a History and Physical                            was performed, and patient medications and                            allergies were reviewed. The patient's tolerance of                            previous anesthesia was also reviewed. The risks                            and benefits of the procedure and the sedation                            options and risks were discussed with the patient.                            All questions were answered, and informed consent                            was obtained. Prior Anticoagulants: The patient has                            taken no previous anticoagulant or antiplatelet                            agents. ASA Grade Assessment: III - A patient with                            severe systemic disease. After reviewing the risks  and benefits, the patient was deemed in                            satisfactory condition to undergo the procedure.                           After obtaining informed consent, the colonoscope                            was passed under direct vision. Throughout the                            procedure, the patient's blood pressure, pulse, and                            oxygen saturations were monitored  continuously. The                            Colonoscope was introduced through the and advanced                            to the 5 cm into the ileum. The EC-[id number] ([id number])                            scope was introduced through the and advanced to                            the. The colonoscopy was performed without                            difficulty. The patient tolerated the procedure                            well. The quality of the bowel preparation was                            adequate. The terminal ileum, ileocecal valve,                            appendiceal orifice, and rectum were photographed. Scope In: 11:21:41 AM Scope Out: 11:35:28 AM Scope Withdrawal Time: 0 hours 9 minutes 8 seconds  Total Procedure Duration: 0 hours 13 minutes 47 seconds  Findings:      The perianal and digital rectal examinations were normal.      Scattered small and large-mouthed diverticula were found in the sigmoid       colon.      A 5 mm polyp was found in the ascending colon and descending colon.. The       polyps were sessile. The polyps were removed with a cold snare.       Resection and retrieval were complete. . Estimated blood loss was       minimal.      The exam was otherwise without abnormality on direct and retroflexion       views except for evidence of tattoo and ascending colon.  Segmental       biopsies of the ascending and descending colon to evaluate for pic       colitis. The mucosa appear. Impression:               - Diverticulosis in the sigmoid colon.                           - colonic polyps, removed with a cold snare.                            Resected and retrieved.                           - The examination was otherwise normal on direct                            and retroflexion views. Moderate Sedation:      Moderate (conscious) sedation was personally administered by an       anesthesia professional. The following parameters were monitored: oxygen        saturation, heart rate, blood pressure, respiratory rate, EKG, adequacy       of pulmonary ventilation, and response to care. Total physician       intraservice time was 18 minutes. Recommendation:           - Patient has a contact number available for                            emergencies. The signs and symptoms of potential                            delayed complications were discussed with the                            patient. Return to normal activities tomorrow.                            Written discharge instructions were provided to the                            patient.                           - Resume previous diet.                           - Continue present medications.                           - Await pathology results.                           - Repeat colonoscopy date to be determined after                            pending pathology results are reviewed for  surveillance based on pathology results.                           - Return to GI office in 6 weeks. See EGD report Procedure Code(s):        --- Professional ---                           [phone], Colonoscopy, flexible; with removal of                            tumor(s), polyp(s), or other lesion(s) by snare                            technique Diagnosis Code(s):        --- Professional ---                           D12.2, Benign neoplasm of ascending colon                           K52.9, Noninfective gastroenteritis and colitis,                            unspecified                           K57.30, Diverticulosis of large intestine without                            perforation or abscess without bleeding CPT copyright 2016 American Medical Association. All rights reserved. The codes documented in this report are preliminary and upon coder review may  be revised to meet current compliance requirements. [name]. Rourk, MD Norvel Richards, MD 02/21/2017 11:46:29 AM This  report has been signed electronically. Number of Addenda: 0

## 2017-02-21 NOTE — Discharge Instructions (Addendum)
Colon polyp and diverticulosis information provided  Further recommendations to follow pending review of pathology report  Office visit with [place] in 6 weeks. Follow up with Vicente Males on August 3 at 8:30   Colonoscopy Discharge Instructions  Read the instructions outlined below and refer to this sheet in the next few weeks. These discharge instructions provide you with general information on caring for yourself after you leave the hospital. Your doctor may also give you specific instructions. While your treatment has been planned according to the most current medical practices available, unavoidable complications occasionally occur. If you have any problems or questions after discharge, call Dr. Gala Romney at [phone]. ACTIVITY  You may resume your regular activity, but move at a slower pace for the next 24 hours.   Take frequent rest periods for the next 24 hours.   Walking will help get rid of the air and reduce the bloated feeling in your belly (abdomen).   No driving for 24 hours (because of the medicine (anesthesia) used during the test).    Do not sign any important legal documents or operate any machinery for 24 hours (because of the anesthesia used during the test).  NUTRITION  Drink plenty of fluids.   You may resume your normal diet as instructed by your doctor.   Begin with a light meal and progress to your normal diet. Heavy or fried foods are harder to digest and may make you feel sick to your stomach (nauseated).   Avoid alcoholic beverages for 24 hours or as instructed.  MEDICATIONS  You may resume your normal medications unless your doctor tells you otherwise.  WHAT YOU CAN EXPECT TODAY  Some feelings of bloating in the abdomen.   Passage of more gas than usual.   Spotting of blood in your stool or on the toilet paper.  IF YOU HAD POLYPS REMOVED DURING THE COLONOSCOPY:  No aspirin products for 7 days or as instructed.   No alcohol for 7 days or as instructed.   Eat  a soft diet for the next 24 hours.  FINDING OUT THE RESULTS OF YOUR TEST Not all test results are available during your visit. If your test results are not back during the visit, make an appointment with your caregiver to find out the results. Do not assume everything is normal if you have not heard from your caregiver or the medical facility. It is important for you to follow up on all of your test results.  SEEK IMMEDIATE MEDICAL ATTENTION IF:  You have more than a spotting of blood in your stool.   Your belly is swollen (abdominal distention).   You are nauseated or vomiting.   You have a temperature over 101.   You have abdominal pain or discomfort that is severe or gets worse throughout the day.  EGD Discharge instructions Please read the instructions outlined below and refer to this sheet in the next few weeks. These discharge instructions provide you with general information on caring for yourself after you leave the hospital. Your doctor may also give you specific instructions. While your treatment has been planned according to the most current medical practices available, unavoidable complications occasionally occur. If you have any problems or questions after discharge, please call your doctor. ACTIVITY  You may resume your regular activity but move at a slower pace for the next 24 hours.   Take frequent rest periods for the next 24 hours.   Walking will help expel (get rid of) the air and  reduce the bloated feeling in your abdomen.   No driving for 24 hours (because of the anesthesia (medicine) used during the test).   You may shower.   Do not sign any important legal documents or operate any machinery for 24 hours (because of the anesthesia used during the test).  NUTRITION  Drink plenty of fluids.   You may resume your normal diet.   Begin with a light meal and progress to your normal diet.   Avoid alcoholic beverages for 24 hours or as instructed by your caregiver.   MEDICATIONS  You may resume your normal medications unless your caregiver tells you otherwise.  WHAT YOU CAN EXPECT TODAY  You may experience abdominal discomfort such as a feeling of fullness or gas pains.  FOLLOW-UP  Your doctor will discuss the results of your test with you.  SEEK IMMEDIATE MEDICAL ATTENTION IF ANY OF THE FOLLOWING OCCUR:  Excessive nausea (feeling sick to your stomach) and/or vomiting.   Severe abdominal pain and distention (swelling).   Trouble swallowing.   Temperature over 101 F (37.8 C).   Rectal bleeding or vomiting of blood.    Diverticulosis Diverticulosis is a condition that develops when small pouches (diverticula) form in the wall of the large intestine (colon). The colon is where water is absorbed and stool is formed. The pouches form when the inside layer of the colon pushes through weak spots in the outer layers of the colon. You may have a few pouches or many of them. What are the causes? The cause of this condition is not known. What increases the risk? The following factors may make you more likely to develop this condition:  Being older than age 60. Your risk for this condition increases with age. Diverticulosis is rare among people younger than age 49. By age 51, many people have it.  Eating a low-fiber diet.  Having frequent constipation.  Being overweight.  Not getting enough exercise.  Smoking.  Taking over-the-counter pain medicines, like aspirin and ibuprofen.  Having a family history of diverticulosis.  What are the signs or symptoms? In most people, there are no symptoms of this condition. If you do have symptoms, they may include:  Bloating.  Cramps in the abdomen.  Constipation or diarrhea.  Pain in the lower left side of the abdomen.  How is this diagnosed? This condition is most often diagnosed during an exam for other colon problems. Because diverticulosis usually has no symptoms, it often cannot be  diagnosed independently. This condition may be diagnosed by:  Using a flexible scope to examine the colon (colonoscopy).  Taking an X-ray of the colon after dye has been put into the colon (barium enema).  Doing a CT scan.  How is this treated? You may not need treatment for this condition if you have never developed an infection related to diverticulosis. If you have had an infection before, treatment may include:  Eating a high-fiber diet. This may include eating more fruits, vegetables, and grains.  Taking a fiber supplement.  Taking a live bacteria supplement (probiotic).  Taking medicine to relax your colon.  Taking antibiotic medicines.  Follow these instructions at home:  Drink 6-8 glasses of water or more each day to prevent constipation.  Try not to strain when you have a bowel movement.  If you have had an infection before: ? Eat more fiber as directed by your health care provider or your diet and nutrition specialist (dietitian). ? Take a fiber supplement or probiotic,  if your health care provider approves.  Take over-the-counter and prescription medicines only as told by your health care provider.  If you were prescribed an antibiotic, take it as told by your health care provider. Do not stop taking the antibiotic even if you start to feel better.  Keep all follow-up visits as told by your health care provider. This is important. Contact a health care provider if:  You have pain in your abdomen.  You have bloating.  You have cramps.  You have not had a bowel movement in 3 days. Get help right away if:  Your pain gets worse.  Your bloating becomes very bad.  You have a fever or chills, and your symptoms suddenly get worse.  You vomit.  You have bowel movements that are bloody or black.  You have bleeding from your rectum. Summary  Diverticulosis is a condition that develops when small pouches (diverticula) form in the wall of the large intestine  (colon).  You may have a few pouches or many of them.  This condition is most often diagnosed during an exam for other colon problems.  If you have had an infection related to diverticulosis, treatment may include increasing the fiber in your diet, taking supplements, or taking medicines. This information is not intended to replace advice given to you by your health care provider. Make sure you discuss any questions you have with your health care provider. Document Released: 05/27/2004 Document Revised: 07/19/2016 Document Reviewed: 07/19/2016 Elsevier Interactive Patient Education  2017 [employer].  Colon Polyps Polyps are tissue growths inside the body. Polyps can grow in many places, including the large intestine (colon). A polyp may be a round bump or a mushroom-shaped growth. You could have one polyp or several. Most colon polyps are noncancerous (benign). However, some colon polyps can become cancerous over time. What are the causes? The exact cause of colon polyps is not known. What increases the risk? This condition is more likely to develop in people who:  Have a family history of colon cancer or colon polyps.  Are older than 31 or older than 45 if they are African American.  Have inflammatory bowel disease, such as ulcerative colitis or Crohn disease.  Are overweight.  Smoke cigarettes.  Do not get enough exercise.  Drink too much alcohol.  Eat a diet that is: ? High in fat and red meat. ? Low in fiber.  Had childhood cancer that was treated with abdominal radiation.  What are the signs or symptoms? Most polyps do not cause symptoms. If you have symptoms, they may include:  Blood coming from your rectum when having a bowel movement.  Blood in your stool.The stool may look dark red or black.  A change in bowel habits, such as constipation or diarrhea.  How is this diagnosed? This condition is diagnosed with a colonoscopy. This is a procedure that uses a  lighted, flexible scope to look at the inside of your colon. How is this treated? Treatment for this condition involves removing any polyps that are found. Those polyps will then be tested for cancer. If cancer is found, your health care provider will talk to you about options for colon cancer treatment. Follow these instructions at home: Diet  Eat plenty of fiber, such as fruits, vegetables, and whole grains.  Eat foods that are high in calcium and vitamin D, such as milk, cheese, yogurt, eggs, liver, fish, and broccoli.  Limit foods high in fat, red meats, and processed meats,  such as hot dogs, sausage, bacon, and lunch meats.  Maintain a healthy weight, or lose weight if recommended by your health care provider. General instructions  Do not smoke cigarettes.  Do not drink alcohol excessively.  Keep all follow-up visits as told by your health care provider. This is important. This includes keeping regularly scheduled colonoscopies. Talk to your health care provider about when you need a colonoscopy.  Exercise every day or as told by your health care provider. Contact a health care provider if:  You have new or worsening bleeding during a bowel movement.  You have new or increased blood in your stool.  You have a change in bowel habits.  You unexpectedly lose weight. This information is not intended to replace advice given to you by your health care provider. Make sure you discuss any questions you have with your health care provider. Document Released: 05/26/2004 Document Revised: 02/05/2016 Document Reviewed: 07/21/2015 Elsevier Interactive Patient Education  [employer].

## 2017-02-21 NOTE — Op Note (Signed)
[facility] Patient Name: Michelle Zavala Procedure Date: 02/21/2017 10:40 AM MRN: [id number] Date of Birth: 05-09-1946 Attending MD: Norvel Richards , MD CSN: [id number] Age: 71 Admit Type: Outpatient Procedure:                Upper GI endoscopy Indications:              Dysphagia Providers:                Norvel Richards, MD, Janeece Riggers, RN, Aram Candela Referring MD:              Medicines:                Propofol per Anesthesia Complications:            No immediate complications. Estimated Blood Loss:     Estimated blood loss: none. Procedure:                Pre-Anesthesia Assessment:                           - Prior to the procedure, a History and Physical                            was performed, and patient medications and                            allergies were reviewed. The patient's tolerance of                            previous anesthesia was also reviewed. The risks                            and benefits of the procedure and the sedation                            options and risks were discussed with the patient.                            All questions were answered, and informed consent                            was obtained. Prior Anticoagulants: The patient has                            taken no previous anticoagulant or antiplatelet                            agents. ASA Grade Assessment: III - A patient with                            severe systemic disease. After reviewing the risks  and benefits, the patient was deemed in                            satisfactory condition to undergo the procedure.                           After obtaining informed consent, the endoscope was                            passed under direct vision. Throughout the                            procedure, the patient's blood pressure, pulse, and                            oxygen saturations were monitored  continuously. The                            EG-[id number] ([id number]) scope was introduced through the                            and advanced to the second part of duodenum. The                            upper GI endoscopy was accomplished without                            difficulty. The patient tolerated the procedure                            well. Scope In: 11:12:33 AM Scope Out: 11:17:29 AM Total Procedure Duration: 0 hours 4 minutes 56 seconds  Findings:      The duodenal bulb and second portion of the duodenum were normal.       Estimated blood loss: none.      The examined esophagus was normal.      A small hiatal hernia was present.      The exam was otherwise without abnormality. scope withdrawn and a [place] dilatior passed easily. They look back revealed no       apparent omplication. Impression:               - Normal duodenal bulb and second portion of the                            duodenum.                           - Normal esophagus. status post dilation.                           - Small hiatal hernia.                           - The examination was otherwise normal.                           -  No specimens collected. Moderate Sedation:      Moderate (conscious) sedation was administered by the endoscopy nurse       and supervised by the endoscopist. The following parameters were       monitored: oxygen saturation, heart rate, blood pressure, respiratory       rate, EKG, adequacy of pulmonary ventilation, and response to care.       Total physician intraservice time was 8 minutes. Recommendation:           - Patient has a contact number available for                            emergencies. The signs and symptoms of potential                            delayed complications were discussed with the                            patient. Return to normal activities tomorrow.                            Written discharge instructions were provided to the                             patient.                           - Resume previous diet.                           - Patient has a contact number available for                            emergencies. The signs and symptoms of potential                            delayed complications were discussed with the                            patient. Return to normal activities tomorrow.                            Written discharge instructions were provided to the                            patient.                           - Resume previous diet.                           - Continue present medications.                           - Return to my office in 6 weeks. See colonoscopy  report. Procedure Code(s):        --- Professional ---                           [id number], Esophagogastroduodenoscopy, flexible,                            transoral; diagnostic, including collection of                            specimen(s) by brushing or washing, when performed                            (separate procedure) Diagnosis Code(s):        --- Professional ---                           K44.9, Diaphragmatic hernia without obstruction or                            gangrene                           R13.10, Dysphagia, unspecified CPT copyright 2016 American Medical Association. All rights reserved. The codes documented in this report are preliminary and upon coder review may  be revised to meet current compliance requirements. [name]. Nylah Butkus, MD Norvel Richards, MD 02/21/2017 11:41:11 AM This report has been signed electronically. Number of Addenda: 0

## 2017-02-21 NOTE — Transfer of Care (Signed)
Immediate Anesthesia Transfer of Care Note  Patient: Michelle Zavala  Procedure(s) Performed: Procedure(s) with comments: COLONOSCOPY WITH PROPOFOL (N/A) - 900 ESOPHAGOGASTRODUODENOSCOPY (EGD) WITH PROPOFOL (N/A) MALONEY DILATION (N/A) BIOPSY - ascending colon biopsy  POLYPECTOMY - hepatic flexure polyp cs  Patient Location: PACU  Anesthesia Type:MAC  Level of Consciousness: awake  Airway & Oxygen Therapy: Patient Spontanous Breathing  Post-op Assessment: Report given to RN  Post vital signs: Reviewed  Last Vitals:  Vitals:   02/21/17 1044 02/21/17 1045  BP: (!) 151/103 134/90  Resp: 10 18  Temp:      Last Pain:  Vitals:   02/21/17 1019  TempSrc: Oral      Patients Stated Pain Goal: 9 (65/03/54 6568)  Complications: No apparent anesthesia complications

## 2017-02-21 NOTE — Anesthesia Preprocedure Evaluation (Signed)
Anesthesia Evaluation  Patient identified by MRN, date of birth, ID band Patient awake    Reviewed: Allergy & Precautions, NPO status , Patient's Chart, lab work & pertinent test results, reviewed documented beta blocker date and time   Airway Mallampati: II  TM Distance: >3 FB Neck ROM: Limited    Dental  (+) Teeth Intact, Partial Upper   Pulmonary COPD,    breath sounds clear to auscultation       Cardiovascular hypertension, Pt. on medications and Pt. on home beta blockers + CAD and + Peripheral Vascular Disease  + dysrhythmias Supra Ventricular Tachycardia  Rhythm:Regular Rate:Normal     Neuro/Psych PSYCHIATRIC DISORDERS Anxiety Depression Hx C-2 fx with limited extension and chronic neck pain.    GI/Hepatic GERD  ,  Endo/Other    Renal/GU      Musculoskeletal   Abdominal   Peds  Hematology   Anesthesia Other Findings   Reproductive/Obstetrics                             Anesthesia Physical Anesthesia Plan  ASA: III  Anesthesia Plan: MAC   Post-op Pain Management:    Induction: Intravenous  PONV Risk Score and Plan:   Airway Management Planned: Simple Face Mask  Additional Equipment:   Intra-op Plan:   Post-operative Plan:   Informed Consent: I have reviewed the patients History and Physical, chart, labs and discussed the procedure including the risks, benefits and alternatives for the proposed anesthesia with the patient or authorized representative who has indicated his/her understanding and acceptance.     Plan Discussed with:   Anesthesia Plan Comments: (Check neck position awake.)        Anesthesia Quick Evaluation

## 2017-02-21 NOTE — H&P (View-Only) (Signed)
Referring Provider: Octavio Graves, DO Primary Care Physician:  Octavio Graves, DO Primary GI: Dr. Gala Romney   Chief Complaint  Patient presents with  . Abdominal Pain    reschedule procedures  . Diarrhea  . Nausea  . Emesis    HPI:   Michelle Zavala is a 71 y.o. female presenting today with a history of chronic diarrhea, chronic abdominal pain, history of colonoscopy in 2016 by Dr. Britta Mccreedy but no colonic biopsies at that time or evaluation of TI after review of records. EGD Nov 2016 with only reactive gastropathy found. Negative H.pylori. 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 Progression of SMA disease could cause issues, but clinical monitoring recommended per IR. She was seen by Vascular Surgery due to renal artery disease as well. Medical therapy recommended. Symptomatic fat-containing ventral hernia noted at visit in March 2017, undergoing hernia repair in March 2017. No weight loss. Chronic diarrhea. Negative hydrogen breath test for small bowel bacterial overgrowth. CT 01/12/16 showed a 10 cm length of large bowel narrowing in the region of the hepatic flexure, may be transient. No mass. Single contrast barium enema recommended to confirm the absence of a fixed structural lesion. Barium enema was normal. She has had another CT as of Oct 2017 without any acute findings. She had been scheduled for a colonoscopy with random colonic biopsies but had to cancel due to pneumonia. She also will be scheduled for an EGD due to dysphagia; however, BPE in Jan 2018 noted age-related dysmotility, otherwise negative. 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  Diarrhea: No improvement with Bentyl or imodium. Everything she eats goes right through her. Thinks she has tried Levsin. Stools sometimes a few and sometimes "half a day". Mace[employer] food "works on me". Cookies don't bother her. Coke doesn't bother her. Doesn't eat  lunch. Usually eats just one meal a day then snacks the rest of the time. No rectal bleeding. Notes vague lower abdominal pain related to bowel habits. Sometimes has to vomit after having a BM. Has to holler for her husband.   Dysphagia: still problems with solid food and liquid. Feels like food just wants to lay in upper abdomen. Protonix BID.     Past Medical History:  Diagnosis Date  . Anxiety   . Arthritis   . C2 cervical fracture ([place]) 09/17/13   Traumatic fracture witth minimal displacement  . Chronic lung disease    Fibrosis - Dr. Koleen Nimrod  . COPD (chronic obstructive pulmonary disease) ([place])   . Coronary atherosclerosis of native coronary artery    Mild atherosclerosis 3/10, LVEF 60-65%  . Depression   . Diverticulosis   . Essential hypertension, benign   . GERD (gastroesophageal reflux disease)   . PSVT (paroxysmal supraventricular tachycardia) ([place])     Past Surgical History:  Procedure Laterality Date  . ABDOMINAL HYSTERECTOMY    . ANTERIOR RELEASE VERTEBRAL BODY W/ POSTERIOR FUSION    . BACTERIAL OVERGROWTH TEST N/A 02/19/2016   Procedure: BACTERIAL OVERGROWTH TEST;  Surgeon: Daneil Dolin, MD;  Location: [facility];  Service: Endoscopy;  Laterality: N/A;  0700  . BIOPSY N/A 08/04/2015   Procedure: BIOPSY;  Surgeon: Daneil Dolin, MD;  Location: [facility];  Service: Endoscopy;  Laterality: N/A;  Gastric  . CESAREAN SECTION    . CHOLECYSTECTOMY  1973  . COLONOSCOPY  June 2016   Dr. Britta Mccreedy: moderate diverticulosis in sigmoid  colon, surveillance in 5 years   . ESOPHAGOGASTRODUODENOSCOPY (EGD) WITH PROPOFOL N/A 08/04/2015   Dr. Rourk:abnormal gastric mucosa s/p biopsy. Reactive gastropathy. Negative H.pylori  . INCISIONAL HERNIA REPAIR N/A 12/03/2015   Procedure: [name] HERNIORRHAPHY WITH MESH;  Surgeon: Aviva Signs, MD;  Location: [facility];  Service: General;  Laterality: N/A;  . INSERTION OF MESH  12/03/2015   Procedure: INSERTION OF MESH;  Surgeon: Aviva Signs,  MD;  Location: [facility];  Service: General;;  . TMJ ARTHROSCOPY Bilateral 02/04/2015   Procedure: BILATERAL TEMPOROMANDIBULAR JOINT (TMJ) ARTHROSCOPY MENISECTOMY WITH FAT GRAFT FROM ABDOMEN ;  Surgeon: Diona Browner, DDS;  Location: [facility];  Service: Oral Surgery;  Laterality: Bilateral;  . TOTAL KNEE ARTHROPLASTY Bilateral   . TUBAL LIGATION      Current Outpatient Prescriptions  Medication Sig Dispense Refill  . acetaminophen (TYLENOL) 650 MG CR tablet Take 650 mg by mouth every 8 (eight) hours as needed for pain.    [name] albuterol (PROAIR HFA) 108 (90 Base) MCG/ACT inhaler Inhale 2 puffs into the lungs every 4 (four) hours as needed for wheezing or shortness of breath.    [name] albuterol (PROVENTIL) (2.5 MG/3ML) 0.083% nebulizer solution Take 2.5 mg by nebulization every 4 (four) hours as needed for wheezing or shortness of breath.    . alfuzosin (UROXATRAL) 10 MG 24 hr tablet Take 10 mg by mouth daily with breakfast.    . amitriptyline (ELAVIL) 75 MG tablet Take 75 mg by mouth at bedtime.  6  . budesonide-formoterol (SYMBICORT) 160-4.5 MCG/ACT inhaler Inhale 2 puffs into the lungs as needed.     . fluticasone (FLONASE) 50 MCG/ACT nasal spray Place 2 sprays into both nostrils daily as needed for allergies or rhinitis.    . hydrochlorothiazide (HYDRODIURIL) 25 MG tablet Take 25 mg by mouth daily.    [name] LORazepam (ATIVAN) 2 MG tablet Take 2 mg by mouth 3 (three) times daily. Take 2 mg by mouth in the morning, 2 mg at noon, and 4 mg at bedtime    . meclizine (ANTIVERT) 25 MG tablet Take 25 mg by mouth daily as needed for dizziness.    . metoprolol succinate (TOPROL-XL) 100 MG 24 hr tablet One half twice daily    . ondansetron (ZOFRAN) 4 MG tablet Take 4 mg by mouth every 8 (eight) hours as needed for nausea or vomiting.    . pantoprazole (PROTONIX) 40 MG tablet Take 1 tablet (40 mg total) by mouth 2 (two) times daily. 60 tablet 1  . potassium chloride SA (KLOR-CON M20) 20 MEQ tablet Take 2 tablets (40 mEq  total) by mouth daily. 60 tablet 2  . sertraline (ZOLOFT) 100 MG tablet Take 100 mg by mouth at bedtime.     . Vitamin D, Ergocalciferol, (DRISDOL) 50000 units CAPS capsule Take 50,000 Units by mouth every 7 (seven) days.     No current facility-administered medications for this visit.     Allergies as of 02/03/2017 - Review Complete 02/03/2017  Allergen Reaction Noted  . Sulfonamide derivatives Hives     Family History  Problem Relation Age of Onset  . Coronary artery disease Father        Premature  . Heart disease Father   . Coronary artery disease Brother        Premature  . Coronary artery disease Sister        Premature  . Colon cancer Neg Hx     Social History   Social History  . Marital  status: Widowed    Spouse name: N/A  . Number of children: 3  . Years of education: 7   Occupational History  . Retired    Social History Main Topics  . Smoking status: Never Smoker  . Smokeless tobacco: Never Used  . Alcohol use No  . Drug use: No  . Sexual activity: No   Other Topics Concern  . None   Social History Narrative   Occasionally drinks [employer]     Review of Systems: Gen: Denies fever, chills, anorexia. Denies fatigue, weakness, weight loss.  CV: Denies chest pain, palpitations, syncope, peripheral edema, and claudication. Resp: +DOE  GI: see HPI  Derm: Denies rash, itching, dry skin Psych: mild depression/anxiety  Heme: Denies bruising, bleeding, and enlarged lymph nodes.  Physical Exam: BP 121/87   Pulse 75   Temp 97.1 F (36.2 C) (Oral)   Ht 5\' 3"  (1.6 m)   Wt 211 lb 3.2 oz (95.8 kg)   BMI 37.41 kg/m  General:   Alert and oriented. No distress noted. Pleasant and cooperative.  Head:  Normocephalic and atraumatic. Eyes:  Conjuctiva clear without scleral icterus. Mouth:  Oral mucosa pink and moist. Good dentition. No lesions. Heart:  S1, S2 present without murmurs, rubs, or gallops. Regular rate and rhythm. Abdomen:  +BS, soft, non-tender and  non-distended. No rebound or guarding. No HSM or masses noted. Msk:  Symmetrical without gross deformities. Normal posture. Extremities:  Without edema. Neurologic:  Alert and  oriented x4;  grossly normal neurologically. Psych:  Alert and cooperative. Normal mood and affect.

## 2017-02-21 NOTE — Anesthesia Postprocedure Evaluation (Signed)
Anesthesia Post Note  Patient: Michelle Zavala  Procedure(s) Performed: Procedure(s) (LRB): COLONOSCOPY WITH PROPOFOL (N/A) ESOPHAGOGASTRODUODENOSCOPY (EGD) WITH PROPOFOL (N/A) MALONEY DILATION (N/A) BIOPSY POLYPECTOMY  Patient location during evaluation: PACU Anesthesia Type: MAC Level of consciousness: awake and alert and oriented Pain management: pain level controlled Vital Signs Assessment: post-procedure vital signs reviewed and stable Respiratory status: spontaneous breathing Cardiovascular status: blood pressure returned to baseline Postop Assessment: no signs of nausea or vomiting Anesthetic complications: no Comments: Late entry.     Last Vitals:  Vitals:   02/21/17 1200 02/21/17 1210  BP: 133/84 125/83  Pulse: 70 79  Resp: 13 16  Temp:  36.9 C    Last Pain:  Vitals:   02/21/17 1210  TempSrc: Oral                 Joaquim Tolen

## 2017-02-23 ENCOUNTER — Other Ambulatory Visit: Payer: Self-pay | Admitting: Internal Medicine

## 2017-02-23 DIAGNOSIS — R0609 Other forms of dyspnea: Secondary | ICD-10-CM

## 2017-02-24 ENCOUNTER — Ambulatory Visit
Admission: RE | Admit: 2017-02-24 | Discharge: 2017-02-24 | Disposition: A | Discharge status: Home / Self Care | Payer: Medicare Other | Source: Ambulatory Visit | Attending: Otolaryngology | Admitting: Otolaryngology

## 2017-02-24 DIAGNOSIS — E041 Nontoxic single thyroid nodule: Secondary | ICD-10-CM

## 2017-02-25 ENCOUNTER — Encounter: Payer: Self-pay | Admitting: Internal Medicine

## 2017-02-28 ENCOUNTER — Encounter (HOSPITAL_COMMUNITY): Payer: Self-pay | Admitting: Internal Medicine

## 2017-03-21 ENCOUNTER — Other Ambulatory Visit (HOSPITAL_COMMUNITY): Payer: Medicare Other

## 2017-03-24 ENCOUNTER — Other Ambulatory Visit: Payer: Self-pay | Admitting: Internal Medicine

## 2017-03-24 ENCOUNTER — Other Ambulatory Visit: Payer: Self-pay | Admitting: Cardiology

## 2017-04-15 ENCOUNTER — Encounter: Payer: Self-pay | Admitting: Gastroenterology

## 2017-04-15 ENCOUNTER — Ambulatory Visit (INDEPENDENT_AMBULATORY_CARE_PROVIDER_SITE_OTHER): Payer: Medicare Other | Admitting: Gastroenterology

## 2017-04-15 VITALS — BP 129/91 | HR 75 | Temp 97.0°F | Ht 65.0 in | Wt 210.6 lb

## 2017-04-15 DIAGNOSIS — R1010 Upper abdominal pain, unspecified: Secondary | ICD-10-CM | POA: Diagnosis not present

## 2017-04-15 MED ORDER — ONDANSETRON HCL 4 MG PO TABS: 4.0000 mg | ORAL_TABLET | Freq: Three times a day (TID) | ORAL | 1 refills | Status: DC | PRN

## 2017-04-15 NOTE — Progress Notes (Signed)
Referring Provider: Center, [facility] Primary Care Physician:  Center, [place] Primary GI: Dr. Gala Romney   Chief Complaint  Patient presents with  . Constipation  . Abdominal Pain    mid abd    HPI:   Michelle Zavala is a 71 y.o. 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 Progression of SMA disease could cause issues, but clinical monitoring recommended per IR. She was seen by Vascular Surgery due to renal artery disease as well. Medical therapy recommended. Symptomatic fat-containing ventral hernia noted at visit in March 2017, undergoing hernia repair in March 2017. Recently completed TCS/EGD with negative segmental colonic biopsies, needing surveillance in 5 years due to colonic adenomas. EGD with empiric dilatation, small hiatal hernia. Celiac serologies negative. 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 Negative hydrogen breath test for small bowel bacterial overgrowth  Presenting today stating she had a piece of stool out last night but couldn't get it up and couldn't get it down. Constipated. Taking Benefiber. No further diarrhea. Stomach hurts "all over". Lots of gas. Protonix BID. Sometimes pain after eating. Ativan has been decreased. Son has stage IV cancer, she believes the primary site is colon. Has 3 "pending" deaths. Daughter worries her.   Hasn't done well with Linzess historically due to diarrhea. Feels like stool is unproductive. Has a lot of nausea. Sometimes vomiting when sitting on toilet.    Past Medical History:  Diagnosis Date  . Anxiety   . Arthritis   . C2 cervical fracture ([place]) 09/17/13   Traumatic fracture witth minimal displacement  . Chronic lung disease    Fibrosis - Dr. Koleen Nimrod  . COPD (chronic obstructive pulmonary disease) ([place])   . Coronary atherosclerosis of native  coronary artery    Mild atherosclerosis 3/10, LVEF 60-65%  . Depression   . Diverticulosis   . Essential hypertension, benign   . GERD (gastroesophageal reflux disease)   . PSVT (paroxysmal supraventricular tachycardia) ([place])     Past Surgical History:  Procedure Laterality Date  . ABDOMINAL HYSTERECTOMY    . ANTERIOR RELEASE VERTEBRAL BODY W/ POSTERIOR FUSION    . BACTERIAL OVERGROWTH TEST N/A 02/19/2016   Procedure: BACTERIAL OVERGROWTH TEST;  Surgeon: Daneil Dolin, MD;  [facility];  Service: Endoscopy;  Laterality: N/A;  0700  . BIOPSY N/A 08/04/2015   Procedure: BIOPSY;  Surgeon: Daneil Dolin, MD;  [facility];  Service: Endoscopy;  Laterality: N/A;  Gastric  . BIOPSY  02/21/2017   Procedure: BIOPSY;  Surgeon: Daneil Dolin, MD;  [facility];  Service: Endoscopy;;  ascending colon biopsy   . CESAREAN SECTION    . CHOLECYSTECTOMY  1973  . COLONOSCOPY  June 2016   Dr. Britta Mccreedy: moderate diverticulosis in sigmoid colon, surveillance in 5 years   . COLONOSCOPY WITH PROPOFOL N/A 02/21/2017   Dr. Gala Romney: diverticulosis in sigmoid colon, tubular adenomas, segmental biopsies benign. Surveillance in 5 years   . ESOPHAGOGASTRODUODENOSCOPY (EGD) WITH PROPOFOL N/A 08/04/2015   Dr. Rourk:abnormal gastric mucosa s/p biopsy. Reactive gastropathy. Negative H.pylori  . ESOPHAGOGASTRODUODENOSCOPY (EGD) WITH PROPOFOL N/A 02/21/2017   Dr. Gala Romney: empiric esophageal dilatation, small hiatal hernia, otherwise normal  . INCISIONAL HERNIA REPAIR N/A 12/03/2015   Procedure: [name] HERNIORRHAPHY WITH MESH;  Surgeon: Aviva Signs, MD;  [facility];  Service: General;  Laterality: N/A;  . INSERTION OF MESH  12/03/2015   Procedure: INSERTION OF MESH;  Surgeon: Aviva Signs, MD;  [facility];  Service: General;;  . [name] DILATION N/A 02/21/2017   Procedure: Keturah Shavers;  Surgeon: Daneil Dolin, MD;  [facility];  Service: Endoscopy;  Laterality: N/A;   . POLYPECTOMY  02/21/2017   Procedure: POLYPECTOMY;  Surgeon: Daneil Dolin, MD;  [facility];  Service: Endoscopy;;  hepatic flexure polyp cs  . TMJ ARTHROSCOPY Bilateral 02/04/2015   Procedure: BILATERAL TEMPOROMANDIBULAR JOINT (TMJ) ARTHROSCOPY MENISECTOMY WITH FAT GRAFT FROM ABDOMEN ;  Surgeon: Diona Browner, DDS;  Location: [place];  Service: Oral Surgery;  Laterality: Bilateral;  . TOTAL KNEE ARTHROPLASTY Bilateral   . TUBAL LIGATION      Current Outpatient Prescriptions  Medication Sig Dispense Refill  . acetaminophen (TYLENOL) 650 MG CR tablet Take 1,300 mg by mouth at bedtime as needed for pain.     [name] albuterol (PROAIR HFA) 108 (90 Base) MCG/ACT inhaler Inhale 2 puffs into the lungs every 4 (four) hours as needed for wheezing or shortness of breath.    [name] albuterol (PROVENTIL) (2.5 MG/3ML) 0.083% nebulizer solution Take 2.5 mg by nebulization every 4 (four) hours as needed for wheezing or shortness of breath.    . alfuzosin (UROXATRAL) 10 MG 24 hr tablet Take 10 mg by mouth daily with breakfast.    . amitriptyline (ELAVIL) 75 MG tablet Take 75 mg by mouth at bedtime.  6  . budesonide-formoterol (SYMBICORT) 160-4.5 MCG/ACT inhaler Inhale 2 puffs into the lungs 2 (two) times daily.     . fluticasone (FLONASE) 50 MCG/ACT nasal spray Place 2 sprays into both nostrils daily as needed for allergies or rhinitis.    . hydrochlorothiazide (HYDRODIURIL) 25 MG tablet Take 25 mg by mouth daily.    [name] LORazepam (ATIVAN) 2 MG tablet Take 2-4 mg by mouth 3 (three) times daily. Takes 2mg  in the morning and 4mg  at night    . meclizine (ANTIVERT) 25 MG tablet Take 25 mg by mouth daily as needed for dizziness.    . metoprolol succinate (TOPROL-XL) 50 MG 24 hr tablet Take 1 tablet (50 mg total) by mouth 2 (two) times daily. 60 tablet 3  . pantoprazole (PROTONIX) 40 MG tablet Take 1 tablet (40 mg total) by mouth 2 (two) times daily. 60 tablet 1  . potassium chloride SA (K-DUR,KLOR-CON) 20 MEQ  tablet TAKE TWO (2) TABLETS BY MOUTH DAILY 60 tablet 3  . sertraline (ZOLOFT) 100 MG tablet Take 100 mg by mouth daily.     . Vitamin D, Ergocalciferol, (DRISDOL) 50000 units CAPS capsule Take 50,000 Units by mouth every 7 (seven) days. Saturday    . ondansetron (ZOFRAN) 4 MG tablet Take 1 tablet (4 mg total) by mouth every 8 (eight) hours as needed for nausea or vomiting. 30 tablet 1   No current facility-administered medications for this visit.     Allergies as of 04/15/2017 - Review Complete 04/15/2017  Allergen Reaction Noted  . Sulfonamide derivatives Hives     Family History  Problem Relation Age of Onset  . Coronary artery disease Father        Premature  . Heart disease Father   . Coronary artery disease Brother        Premature  . Coronary artery disease Sister        Premature  . Colon cancer Neg Hx     Social History  Social History  . Marital status: Widowed    Spouse name: N/A  . Number of children: 3  . Years of education: 7   Occupational History  . Retired    Social History Main Topics  . Smoking status: Never Smoker  . Smokeless tobacco: Never Used  . Alcohol use No  . Drug use: No  . Sexual activity: No   Other Topics Concern  . None   Social History Narrative   Occasionally drinks [employer]eps[employer]     Review of Systems: As mentioned in HPI   Physical Exam: BP (!) 129/91   Pulse 75   Temp (!) 97 F (36.1 C) (Oral)   Ht 5\' 5"  (1.651 m)   Wt 210 lb 9.6 oz (95.5 kg)   BMI 35.05 kg/m  General:   Alert and oriented. No distress noted. Pleasant and cooperative.  Head:  Normocephalic and atraumatic. Eyes:  Conjuctiva clear without scleral icterus. Mouth:  Oral mucosa pink and moist. Good dentition. No lesions. Abdomen:  +BS, soft, mild TTP upper abdomen and non-distended. No rebound or guarding. No HSM or masses noted. Msk:  Symmetrical without gross deformities. Normal posture. Extremities:  Without edema. Neurologic:  Alert and  oriented x4;   grossly normal neurologically. Psych:  Alert and cooperative. Normal mood and affect.

## 2017-04-15 NOTE — Assessment & Plan Note (Signed)
71 year old female with chronic abdominal pain, extensive prior evaluation that includes hernia repair, CTA noting SMA narrowing but widely patent celiac and IMA, recent colonoscopy. Diarrhea has not resolved. Evaluation completed previously by IR. Doubt chronic mesenteric ischemia. As she is constipated now, this is likely contributing to her chronic abdominal pain. Associated nausea noted. Many emotional stressors including the unfortunate health issues of her son, who appears to be in late-stages of metastatic cancer. All of these combined likely playing a role in symptomatology but will continue to follow closely.   1. Trial of Amitiza 8 mcg po BID. Has previously tried Linzess with diarrhea and unable to tolerate. 2. Zofran prn 3. Continue PPI BID 4. Progress report in 2 weeks 5. Consider GES if persistent nausea 6. 8 week follow-up

## 2017-04-15 NOTE — Patient Instructions (Signed)
For constipation: take Amitiza one gelcap WITH FOOD TO AVOID NAUSEA at breakfast and dinner. Call if we need to increase the dose.  I sent in nausea medication for you.   Please call with an update in about 2 weeks regarding nausea.  Return in 8 weeks.

## 2017-04-18 NOTE — Progress Notes (Signed)
cc'ed to pcp °

## 2017-04-20 ENCOUNTER — Telehealth: Payer: Self-pay | Admitting: Internal Medicine

## 2017-04-20 NOTE — Telephone Encounter (Signed)
Michelle Zavala, you advised pt to call if the [place] wasn't working. Do you want to increase her dosage?

## 2017-04-20 NOTE — Telephone Encounter (Signed)
Make sure she is taking 8 mcg po BID. IF she already is, let's do Amitiza 24 mcg BID.

## 2017-04-20 NOTE — Telephone Encounter (Signed)
[phone]  PLEASE CALL PATIENT, HAVING STOMACH ISSUES AND CONSTIPATION,  CAN NOT GO FOR DAYS

## 2017-04-21 ENCOUNTER — Other Ambulatory Visit: Payer: Self-pay | Admitting: Cardiology

## 2017-04-21 DIAGNOSIS — I6523 Occlusion and stenosis of bilateral carotid arteries: Secondary | ICD-10-CM

## 2017-04-21 NOTE — Telephone Encounter (Signed)
Tried to call pt- NA-LMOM. Samples are at the front desk of amitiza 72mcg.

## 2017-04-22 NOTE — Telephone Encounter (Signed)
Pt came by the office and picked up samples. We went over directions on how to take them. She will call me next week and let me know how they are working.

## 2017-04-29 ENCOUNTER — Ambulatory Visit (INDEPENDENT_AMBULATORY_CARE_PROVIDER_SITE_OTHER): Payer: Medicare Other | Admitting: Gastroenterology

## 2017-04-29 ENCOUNTER — Other Ambulatory Visit: Payer: Self-pay | Admitting: Internal Medicine

## 2017-04-29 ENCOUNTER — Encounter: Payer: Self-pay | Admitting: Gastroenterology

## 2017-04-29 ENCOUNTER — Ambulatory Visit: Payer: Medicare Other | Admitting: Gastroenterology

## 2017-04-29 DIAGNOSIS — K625 Hemorrhage of anus and rectum: Secondary | ICD-10-CM

## 2017-04-29 DIAGNOSIS — R0609 Other forms of dyspnea: Secondary | ICD-10-CM

## 2017-04-29 MED ORDER — HYDROCORTISONE 2.5 % RE CREA: 1.0000 "application " | TOPICAL_CREAM | Freq: Two times a day (BID) | RECTAL | 1 refills | Status: DC

## 2017-04-29 NOTE — Patient Instructions (Addendum)
I have sent in a rectal cream to use twice a day for 7 days, take a break, then resume again if needed.   Avoid straining. Let me know if symptoms do not get better.   We will see you in 6 weeks!   Hemorrhoids Hemorrhoids are swollen veins in and around the rectum or anus. There are two types of hemorrhoids:  Internal hemorrhoids. These occur in the veins that are just inside the rectum. They may poke through to the outside and become irritated and painful.  External hemorrhoids. These occur in the veins that are outside of the anus and can be felt as a painful swelling or hard lump near the anus.  Most hemorrhoids do not cause serious problems, and they can be managed with home treatments such as diet and lifestyle changes. If home treatments do not help your symptoms, procedures can be done to shrink or remove the hemorrhoids. What are the causes? This condition is caused by increased pressure in the anal area. This pressure may result from various things, including:  Constipation.  Straining to have a bowel movement.  Diarrhea.  Pregnancy.  Obesity.  Sitting for long periods of time.  Heavy lifting or other activity that causes you to strain.  Anal sex.  What are the signs or symptoms? Symptoms of this condition include:  Pain.  Anal itching or irritation.  Rectal bleeding.  Leakage of stool (feces).  Anal swelling.  One or more lumps around the anus.  How is this diagnosed? This condition can often be diagnosed through a visual exam. Other exams or tests may also be done, such as:  Examination of the rectal area with a gloved hand (digital rectal exam).  Examination of the anal canal using a small tube (anoscope).  A blood test, if you have lost a significant amount of blood.  A test to look inside the colon (sigmoidoscopy or colonoscopy).  How is this treated? This condition can usually be treated at home. However, various procedures may be done if  dietary changes, lifestyle changes, and other home treatments do not help your symptoms. These procedures can help make the hemorrhoids smaller or remove them completely. Some of these procedures involve surgery, and others do not. Common procedures include:  Rubber band ligation. Rubber bands are placed at the base of the hemorrhoids to cut off the blood supply to them.  Sclerotherapy. Medicine is injected into the hemorrhoids to shrink them.  Infrared coagulation. A type of light energy is used to get rid of the hemorrhoids.  Hemorrhoidectomy surgery. The hemorrhoids are surgically removed, and the veins that supply them are tied off.  Stapled hemorrhoidopexy surgery. A circular stapling device is used to remove the hemorrhoids and use staples to cut off the blood supply to them.  Follow these instructions at home: Eating and drinking  Eat foods that have a lot of fiber in them, such as whole grains, beans, nuts, fruits, and vegetables. Ask your health care provider about taking products that have added fiber (fiber supplements).  Drink enough fluid to keep your urine clear or pale yellow. Managing pain and swelling  Take warm sitz baths for 20 minutes, 3-4 times a day to ease pain and discomfort.  If directed, apply ice to the affected area. Using ice packs between sitz baths may be helpful. ? Put ice in a plastic bag. ? Place a towel between your skin and the bag. ? Leave the ice on for 20 minutes, 2-3 times  a day. General instructions  Take over-the-counter and prescription medicines only as told by your health care provider.  Use medicated creams or suppositories as told.  Exercise regularly.  Go to the bathroom when you have the urge to have a bowel movement. Do not wait.  Avoid straining to have bowel movements.  Keep the anal area dry and clean. Use wet toilet paper or moist towelettes after a bowel movement.  Do not sit on the toilet for long periods of time. This  increases blood pooling and pain. Contact a health care provider if:  You have increasing pain and swelling that are not controlled by treatment or medicine.  You have uncontrolled bleeding.  You have difficulty having a bowel movement, or you are unable to have a bowel movement.  You have pain or inflammation outside the area of the hemorrhoids. This information is not intended to replace advice given to you by your health care provider. Make sure you discuss any questions you have with your health care provider. Document Released: 08/27/2000 Document Revised: 01/28/2016 Document Reviewed: 05/14/2015 Elsevier Interactive Patient Education  2017 [employer].

## 2017-04-29 NOTE — Progress Notes (Signed)
Referring Provider: Center, [facility] Primary Care Physician:  Center, [place] Primary GI: Dr. Gala Romney   Chief Complaint  Patient presents with  . Abdominal Pain  . Diarrhea    will be lump then oozes out of rectum  . Rectal Bleeding    ?wiping too much?    HPI:   Michelle Zavala is a 71 y.o. female presenting today with a history of chronic abdominal pain, diarrhea, dysphagia. 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 Progression of SMA disease could cause issues, but clinical monitoring recommended per IR. She was seen by Vascular Surgery due to renal artery disease as well. Medical therapy recommended. Symptomatic fat-containing ventral hernia noted at visit in March 2017, undergoing hernia repair in March 2017. Recently completed TCS/EGD with negative segmental colonic biopsies, needing surveillance in 5 years due to colonic adenomas. EGD with empiric dilatation, small hiatal hernia. Celiac serologies negative. 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 Negative hydrogen breath test for small bowel bacterial overgrowth.   At last visit, she was constipated and diarrhea had resolved. Son has stage IV cancer. Multiple family members in poor health. Linzess has not been effective in the past due to diarrhea. Prescribed Amitiza at last visit 8 mcg BID but increased to 24 mcg after the visit. Consider GES if persistent nasuea.   BMs "stink". Has to wipe repeatedly, feels like she can't get cleaned. Has had some low-volume hematochezia. Had fecal seepage last week. BMs come out in a big lump, then loose. Has fecal urgency at times. Taking Benefiber. Some rectal burning. Feels like little grits in rectum.   Past Medical History:  Diagnosis Date  . Anxiety   . Arthritis   . C2 cervical fracture ([place]) 09/17/13   Traumatic fracture witth minimal displacement  . Chronic lung disease    Fibrosis - Dr. Koleen Nimrod  .  COPD (chronic obstructive pulmonary disease) ([place])   . Coronary atherosclerosis of native coronary artery    Mild atherosclerosis 3/10, LVEF 60-65%  . Depression   . Diverticulosis   . Essential hypertension, benign   . GERD (gastroesophageal reflux disease)   . PSVT (paroxysmal supraventricular tachycardia) ([place])     Past Surgical History:  Procedure Laterality Date  . ABDOMINAL HYSTERECTOMY    . ANTERIOR RELEASE VERTEBRAL BODY W/ POSTERIOR FUSION    . BACTERIAL OVERGROWTH TEST N/A 02/19/2016   Procedure: BACTERIAL OVERGROWTH TEST;  Surgeon: Daneil Dolin, MD;  [facility];  Service: Endoscopy;  Laterality: N/A;  0700  . BIOPSY N/A 08/04/2015   Procedure: BIOPSY;  Surgeon: Daneil Dolin, MD;  [facility];  Service: Endoscopy;  Laterality: N/A;  Gastric  . BIOPSY  02/21/2017   Procedure: BIOPSY;  Surgeon: Daneil Dolin, MD;  [facility];  Service: Endoscopy;;  ascending colon biopsy   . CESAREAN SECTION    . CHOLECYSTECTOMY  1973  . COLONOSCOPY  June 2016   Dr. Britta Mccreedy: moderate diverticulosis in sigmoid colon, surveillance in 5 years   . COLONOSCOPY WITH PROPOFOL N/A 02/21/2017   Dr. Gala Romney: diverticulosis in sigmoid colon, tubular adenomas, segmental biopsies benign. Surveillance in 5 years   . ESOPHAGOGASTRODUODENOSCOPY (EGD) WITH PROPOFOL N/A 08/04/2015   Dr. Rourk:abnormal gastric mucosa s/p biopsy. Reactive gastropathy. Negative H.pylori  . ESOPHAGOGASTRODUODENOSCOPY (EGD) WITH PROPOFOL N/A 02/21/2017   Dr. Gala Romney: empiric esophageal dilatation, small hiatal hernia, otherwise normal  . INCISIONAL  HERNIA REPAIR N/A 12/03/2015   Procedure: [name] HERNIORRHAPHY WITH MESH;  Surgeon: Aviva Signs, MD;  [facility];  Service: General;  Laterality: N/A;  . INSERTION OF MESH  12/03/2015   Procedure: INSERTION OF MESH;  Surgeon: Aviva Signs, MD;  [facility];  Service: General;;  [name] DILATION N/A 02/21/2017   Procedure: Keturah Shavers;   Surgeon: Daneil Dolin, MD;  [facility];  Service: Endoscopy;  Laterality: N/A;  . POLYPECTOMY  02/21/2017   Procedure: POLYPECTOMY;  Surgeon: Daneil Dolin, MD;  [facility];  Service: Endoscopy;;  hepatic flexure polyp cs  . TMJ ARTHROSCOPY Bilateral 02/04/2015   Procedure: BILATERAL TEMPOROMANDIBULAR JOINT (TMJ) ARTHROSCOPY MENISECTOMY WITH FAT GRAFT FROM ABDOMEN ;  Surgeon: Diona Browner, DDS;  Location: [place];  Service: Oral Surgery;  Laterality: Bilateral;  . TOTAL KNEE ARTHROPLASTY Bilateral   . TUBAL LIGATION      Current Outpatient Prescriptions  Medication Sig Dispense Refill  . acetaminophen (TYLENOL) 650 MG CR tablet Take 1,300 mg by mouth at bedtime as needed for pain.     [name] albuterol (PROAIR HFA) 108 (90 Base) MCG/ACT inhaler Inhale 2 puffs into the lungs every 4 (four) hours as needed for wheezing or shortness of breath.    [name] albuterol (PROVENTIL) (2.5 MG/3ML) 0.083% nebulizer solution Take 2.5 mg by nebulization every 4 (four) hours as needed for wheezing or shortness of breath.    . alfuzosin (UROXATRAL) 10 MG 24 hr tablet Take 10 mg by mouth daily with breakfast.    . amitriptyline (ELAVIL) 75 MG tablet Take 75 mg by mouth at bedtime.  6  . budesonide-formoterol (SYMBICORT) 160-4.5 MCG/ACT inhaler Inhale 2 puffs into the lungs as needed.     . fluticasone (FLONASE) 50 MCG/ACT nasal spray Place 2 sprays into both nostrils daily as needed for allergies or rhinitis.    . hydrochlorothiazide (HYDRODIURIL) 25 MG tablet Take 25 mg by mouth daily.    [name] LORazepam (ATIVAN) 2 MG tablet Take by mouth. Takes 2mg  in the morning and 4mg  at night    . meclizine (ANTIVERT) 25 MG tablet Take 25 mg by mouth daily as needed for dizziness.    . metoprolol succinate (TOPROL-XL) 50 MG 24 hr tablet Take 1 tablet (50 mg total) by mouth 2 (two) times daily. 60 tablet 3  . ondansetron (ZOFRAN) 4 MG tablet Take 1 tablet (4 mg total) by mouth every 8 (eight) hours as needed for  nausea or vomiting. 30 tablet 1  . pantoprazole (PROTONIX) 40 MG tablet Take 1 tablet (40 mg total) by mouth 2 (two) times daily. 60 tablet 1  . potassium chloride SA (K-DUR,KLOR-CON) 20 MEQ tablet TAKE TWO (2) TABLETS BY MOUTH DAILY 60 tablet 3  . sertraline (ZOLOFT) 100 MG tablet Take 100 mg by mouth daily.     . vitamin B-12 (CYANOCOBALAMIN) 100 MCG tablet Take 100 mcg by mouth daily.    . Vitamin D, Ergocalciferol, (DRISDOL) 50000 units CAPS capsule Take 50,000 Units by mouth every 7 (seven) days. Saturday     No current facility-administered medications for this visit.     Allergies as of 04/29/2017 - Review Complete 04/29/2017  Allergen Reaction Noted  . Sulfonamide derivatives Hives     Family History  Problem Relation Age of Onset  . Coronary artery disease Father        Premature  . Heart disease Father   . Coronary artery disease Brother  Premature  . Coronary artery disease Sister        Premature  . Colon cancer Neg Hx     Social History   Social History  . Marital status: Widowed    Spouse name: N/A  . Number of children: 3  . Years of education: 7   Occupational History  . Retired    Social History Main Topics  . Smoking status: Never Smoker  . Smokeless tobacco: Never Used  . Alcohol use No  . Drug use: No  . Sexual activity: No   Other Topics Concern  . None   Social History Narrative   Occasionally drinks [employer]eps[employer]     Review of Systems: As mentioned in HPI   Physical Exam: BP (!) 145/78   Pulse (!) 102   Temp (!) 97.4 F (36.3 C) (Oral)   Ht 5\' 5"  (1.651 m)   Wt 211 lb 6.4 oz (95.9 kg)   BMI 35.18 kg/m  General:   Alert and oriented. No distress noted. Pleasant and cooperative.  Head:  Normocephalic and atraumatic. Eyes:  Conjuctiva clear without scleral icterus. Mouth:  Oral mucosa pink and moist.  Abdomen:  +BS, soft, mild epigastric TTP, LLQ mild TTP, and non-distended. No rebound or guarding.  Rectal: midline posterior  non-thrombosed hemorrhoid, query internal hemorrhoid, no mass or stricture, no gross blood on exam  Msk:  Symmetrical without gross deformities. Normal posture. Extremities:  Without edema. Neurologic:  Alert and  oriented x4 Psych:  Alert and cooperative. Normal mood and affect.

## 2017-04-29 NOTE — Assessment & Plan Note (Signed)
71 year old female with recent low-volume hematochezia, fecal seepage, rectal discomfort, likely related to hemorrhoids. Doing well on Amitiza 24 mcg BID. Continue current regimen, add Anusol BID, consider hemorrhoid banding in future if no improvement with supportive measures. Return in 6 weeks. Other chronic issues are at baseline.

## 2017-05-03 ENCOUNTER — Telehealth (INDEPENDENT_AMBULATORY_CARE_PROVIDER_SITE_OTHER): Payer: Self-pay | Admitting: *Deleted

## 2017-05-03 NOTE — Telephone Encounter (Signed)
FYI: the patient wanted to schedule an appointment for an injection in her lower back. She saw you for cervical injections over a year ago. She has seen Dr. Lorin Mercy and Dr. Louanne Skye, and has had cervical injections at [facility] She said she does not go there anymore and that she currently is not in pain management. I have scheduled her for an OV.

## 2017-05-03 NOTE — Telephone Encounter (Signed)
Pt came in to make appt with Dr. Ernestina Patches.

## 2017-05-03 NOTE — Telephone Encounter (Signed)
Left message for pt to call back for scheduling.

## 2017-05-03 NOTE — Telephone Encounter (Signed)
Very good 

## 2017-05-05 ENCOUNTER — Other Ambulatory Visit (INDEPENDENT_AMBULATORY_CARE_PROVIDER_SITE_OTHER): Payer: Medicare Other

## 2017-05-05 ENCOUNTER — Ambulatory Visit (INDEPENDENT_AMBULATORY_CARE_PROVIDER_SITE_OTHER): Payer: Medicare Other | Admitting: Internal Medicine

## 2017-05-05 ENCOUNTER — Encounter: Payer: Self-pay | Admitting: Internal Medicine

## 2017-05-05 VITALS — BP 126/90 | HR 75 | Ht 65.0 in | Wt 214.2 lb

## 2017-05-05 DIAGNOSIS — R0609 Other forms of dyspnea: Secondary | ICD-10-CM | POA: Diagnosis not present

## 2017-05-05 DIAGNOSIS — R058 Other specified cough: Secondary | ICD-10-CM | POA: Insufficient documentation

## 2017-05-05 DIAGNOSIS — R05 Cough: Secondary | ICD-10-CM

## 2017-05-05 DIAGNOSIS — I10 Essential (primary) hypertension: Secondary | ICD-10-CM | POA: Diagnosis not present

## 2017-05-05 LAB — PULMONARY FUNCTION TEST
FEF 25-75 Pre: 2.3 L/sec
FEF2575-%Pred-Pre: 130 %
FEV1-%Pred-Pre: 81 %
FEV1-Pre: 1.72 L
FEV1FVC-%Pred-Pre: 113 %
FEV6-%Pred-Pre: 74 %
FEV6-Pre: 1.99 L
FEV6FVC-%Pred-Pre: 105 %
FVC-%Pred-Pre: 70 %
FVC-Pre: 1.99 L
Pre FEV1/FVC ratio: 86 %
Pre FEV6/FVC Ratio: 100 %

## 2017-05-05 LAB — CBC WITH DIFFERENTIAL/PLATELET
Basophils Absolute: 0 10*3/uL (ref 0.0–0.1)
Basophils Relative: 0.4 % (ref 0.0–3.0)
Eosinophils Absolute: 0.1 10*3/uL (ref 0.0–0.7)
Eosinophils Relative: 1.2 % (ref 0.0–5.0)
HCT: 39.3 % (ref 36.0–46.0)
Hemoglobin: 13.2 g/dL (ref 12.0–15.0)
Lymphocytes Relative: 32 % (ref 12.0–46.0)
Lymphs Abs: 3.1 10*3/uL (ref 0.7–4.0)
MCHC: 33.5 g/dL (ref 30.0–36.0)
MCV: 89.7 fl (ref 78.0–100.0)
Monocytes Absolute: 0.8 10*3/uL (ref 0.1–1.0)
Monocytes Relative: 8.7 % (ref 3.0–12.0)
Neutro Abs: 5.5 10*3/uL (ref 1.4–7.7)
Neutrophils Relative %: 57.7 % (ref 43.0–77.0)
Platelets: 265 10*3/uL (ref 150.0–400.0)
RBC: 4.38 Mil/uL (ref 3.87–5.11)
RDW: 14.1 % (ref 11.5–15.5)
WBC: 9.6 10*3/uL (ref 4.0–10.5)

## 2017-05-05 LAB — NITRIC OXIDE: Nitric Oxide: 26

## 2017-05-05 MED ORDER — BISOPROLOL FUMARATE 5 MG PO TABS: 5.0000 mg | ORAL_TABLET | Freq: Every day | ORAL | 11 refills | Status: DC

## 2017-05-05 MED ORDER — BENZONATATE 200 MG PO CAPS: 200.0000 mg | ORAL_CAPSULE | Freq: Three times a day (TID) | ORAL | 1 refills | Status: DC | PRN

## 2017-05-05 NOTE — Assessment & Plan Note (Addendum)
Onset Nov 2017 - 10/06/2016   Walked RA x one lap @ 185 stopped due to  Sob no desat, slow pace, some dry cough  - FENO 10/06/2016  =   20  Off symbicort  - Spirometry 10/06/2016  FEV1 1.48 (64%)  Ratio 83 with min curvature in effort indep portion of f/v loop - trial of max rx for gerd 10/06/2016 > no better - CT chest w/o contrast:  Minimal scarring bases / old compression fx T12  - PFT's  02/02/2017  FEV1 1.69 (79 % ) ratio 86  p 0 % improvement from saba p nothing prior to study with DLCO  64/63  % corrects to 100  % for alv volume - ERV 41% 02/02/2017  C/w effects of obesity  - 02/02/2017   Walked RA  2 laps @ 185 ft each stopped due to  Sob at fast pace/ sats 98% at end   - 05/05/2017  Walked RA x 3 laps @ 185 ft each stopped due to  Sob/ slow  pace, no   desat     - Spirometry 05/05/2017  FEV1 1.72  (81%)  Ratio 86  s any rx prior and sob walking slowly     DDX of  difficult airways management almost all start with A and  include Adherence, Ace Inhibitors, Acid Reflux, Active Sinus Disease, Alpha 1 Antitripsin deficiency, Anxiety masquerading as Airways dz,  ABPA,  Allergy(esp in young), Aspiration (esp in elderly), Adverse effects of meds,  Active smokers, A bunch of PE's (a small clot burden can't cause this syndrome unless there is already severe underlying pulm or vascular dz with poor reserve) plus two Bs  = Bronchiectasis and Beta blocker use..and one C= CHF - not using med calendar - return with all meds in hand using a trust but verify approach to confirm accurate Medication  Reconciliation The principal here is that until we are certain that the  patients are doing what we've asked, it makes no sense to ask them to do more.    ? Allergy / asthma seem very unlikely > dc symbicort and just use saba prn   ? Acid (or non-acid) GERD > always difficult to exclude as up to 75% of pts in some series report no assoc GI/ Heartburn symptoms> rec continue max (24h)  acid suppression and diet  restrictions/ reviewed     ? Anxiety > usually at the bottom of this list of usual suspects but should be much higher on this pt's based on H and P and note already on psychotropics  - her sob walking down steps is very supportive of this dx  ? BB > change to bisoprolol (see separate a/p)     I had an extended discussion with the patient reviewing all relevant studies completed to date and  lasting 15 to 20 minutes of a 25 minute visit    Each maintenance medication was reviewed in detail including most importantly the difference between maintenance and prns and under what circumstances the prns are to be triggered using an action plan format that is not reflected in the computer generated alphabetically organized AVS but trather by a customized med calendar that reflects the AVS meds with confirmed 100% correlation.   In addition, Please see AVS for unique instructions that I personally wrote and verbalized to the the pt in detail and then reviewed with pt  by my nurse highlighting any  changes in therapy recommended at today's visit to their  plan of care.

## 2017-05-05 NOTE — Progress Notes (Signed)
Spirometry only was completed today.

## 2017-05-05 NOTE — Patient Instructions (Addendum)
Stop symbicort and metaprolol   Start bisoprolol 5 mg each am   For cough > tessilon 200 mg every 6 hours as needed  Pantoprazole (protonix) 40 mg   Take  30-60 min before first meal of the day and Pepcid (famotidine)  20 mg one @  bedtime until return to office - this is the best way to tell whether stomach acid is contributing to your problem.    GERD (REFLUX)  is an extremely common cause of respiratory symptoms just like yours , many times with no obvious heartburn at all.    It can be treated with medication, but also with lifestyle changes including elevation of the head of your bed (ideally with 6 inch  bed blocks),  Smoking cessation, avoidance of late meals, excessive alcohol, and avoid fatty foods, chocolate, peppermint, colas, red wine, and acidic juices such as orange juice.  NO MINT OR MENTHOL PRODUCTS SO NO COUGH DROPS   USE SUGARLESS CANDY INSTEAD (Jolley ranchers or Stover's or Life Savers) or even ice chips will also do - the key is to swallow to prevent all throat clearing. NO OIL BASED VITAMINS - use powdered substitutes.    Please remember to go to the lab department downstairs in the basement  for your tests - we will call you with the results when they are available.      See Tammy NP in 4 weeks with all your medications, even over the counter meds, separated in two separate bags, the ones you take no matter what vs the ones you stop once you feel better and take only as needed when you feel you need them.   Tammy  will generate for you a new user friendly medication calendar that will put [place] all on the same page re: your medication use.     Without this process, it simply isn't possible to assure that we are providing  your outpatient care  with  the attention to detail we feel you deserve.   If we cannot assure that you're getting that kind of care,  then we cannot manage your problem effectively from this clinic.  Once you have seen Tammy and we are sure that we're  all on the same page with your medication use she will arrange follow up with me.

## 2017-05-05 NOTE — Assessment & Plan Note (Addendum)
pfts with ERV 41% 02/02/2017  C/w effects of obesity  HBP  Body mass index is 35.64 kg/m.  -  trending up still  Lab Results  Component Value Date   TSH 1.54 02/02/2017     Contributing also to gerd risk/ doe/reviewed the need and the process to achieve and maintain neg calorie balance > defer f/u primary care including intermittently monitoring thyroid status

## 2017-05-05 NOTE — Assessment & Plan Note (Addendum)
MRI 01/27/17 The paranasal sinuses appear normal - Allergy profile 05/05/2017 >  Eos 0.1 /  IgE  Pending  - FENO 05/05/2017  =   26 > try off symb and metaprolol and on prn tessilon    Upper airway cough syndrome (previously labeled PNDS) , is  so named because it's frequently impossible to sort out how much is  CR/sinusitis with freq throat clearing (which can be related to primary GERD)   vs  causing  secondary (" extra esophageal")  GERD from wide swings in gastric pressure that occur with throat clearing, often  promoting self use of mint and menthol lozenges that reduce the lower esophageal sphincter tone and exacerbate the problem further in a cyclical fashion.   These are the same pts (now being labeled as having "irritable larynx syndrome" by some cough centers) who not infrequently have a history of having failed to tolerate ace inhibitors,  dry powder inhalers (or even ics hfa like symbicort)  or biphosphonates or report having atypical/extraesophageal reflux symptoms that don't respond to standard doses of PPI  and are easily confused as having aecopd or asthma flares by even experienced allergists/ pulmonologists (myself included).   see avs for instructions unique to this ov

## 2017-05-05 NOTE — Progress Notes (Signed)
Subjective:    Patient ID: Michelle Zavala, female    DOB: 1945-11-17,    MRN: [id number]    Brief patient profile:  62 yowf never smoker never respiratory problems at all until around  2012 with mild chest tightness relieved w/in a few minutes with nebulizer and took one home but only used for a few days then   [facility] November 2017 lost breath suddenly    in setting of URI > tried neb from previous helped some / started on symbocort  No better so  referred to pulmonary clinic 10/06/2016 by Dr Melina Copa.   History of Present Illness  10/06/2016 1st [facility] office visit/ Michelle Zavala  prob UACS Chief Complaint  Patient presents with  . Pulmonary Consult    Self referral. Pt c/o DOE x 6 wks. She states that she gets winded walking from room to room at home "I can't do anything".    all started with apparent uri while not taking symbicort though it was listed on all her records as active and still not using it  [place] symptoms are gone now, denies much cough, just throat clearing No trouble at rest, no trouble on 2 pillows sleeping Doe with room to room, no assoc chest tightness min improved on neb ? duoneb  Wt up about 20 lbs over baseline vs prior to onset of symptoms rec Stop your symbicort and proair and just take your nebulizer as needed up to every 4 hours if you feel it helps your breathing  We may need to take you off the high doses of metaprolol if your problem continues but don't change it for now  Pantoprazole (protonix) 40 mg   Take 30- 60 min before your first and last meals of the day and Pepcid (famotidine)  20 mg one @  bedtime until return to office - this is the best way to tell whether stomach acid is contributing to your problem.   GERD diet   10/22/16  NP eval for purpose of med reconciliation: rec Set up for HRCT Chest > never done  follow up Dr. Melvyn Zavala  In 3-4 weeks with PFT > not done Follow med calendar closely and bring to each visit > not done      02/02/2017  Extended  f/u ov/Michelle Zavala re:  Post hosp f/u for "aecopd"  (note she has no copd) / no med calendar, does not recognize copy we provided her so clearly has not been using/ confused with meds Chief Complaint  Patient presents with  . Follow-up    PFT's done today.  Pt states her breathing has been worse since had PNA approx 2 months ago and had to be hospitalized x 4 days in [place]. She also c/o occ cough and wheezing. Her cough is non prod.    last fall was just on  symbicort as needed and stable/chronic doe but much worse since admit with "sepsis secondary to copd" in [facility] = can't walk 100 yards even at a slow pace at a flat grade s stopping due to sob   Sleeps fine flat rec To get the most out of exercise, you need to be continuously aware that you are short of breath, but never out of breath, for 30 minutes daily.   Plan B = Backup Only use your albuterol (proair)    Plan C = Crisis - only use your albuterol nebulizer if you first try Plan B and it fails to help > ok  to use the nebulizer up to every 4 hours but if start needing it regularly call for immediate appointment    05/05/2017  Extended f/u ov/Michelle Zavala re: uacs  Chief Complaint  Patient presents with  . Follow-up    Breathing is unchanged. She states just using symbicort as needed. She is using duoneb 2 x per wk on average. She states coughing more and having "strangling" when she tried to drink something for the past 3 wks. Cough is waking her up in the night.  all of the symptoms started in Nov 2017 and have never resolved    wakes up every day s resp symptoms  Which typically come on  "p walking down basement"  And then  needs inhaler   No problems walking room, just walking down steps but also walking up "even worse'  (very poor insight to what triggers the symtoms)  Most times gets to car ok but uses [facility] parking and sob by the time gets to building / maybe better in cooler weather  Doe ok at [facility] as long as leaning on  Cart    MMRC2 = can't walk a nl pace on a flat grade s sob but does fine slow and flat can't do mb and back    Cough worse at hs since November 2017 also,  and  X 3 weeks also worse drinking but no solid food dysphagia   No obvious day to day or daytime variability or assoc excess/ purulent sputum or mucus plugs or hemoptysis or cp or chest tightness, subjective wheeze or overt sinus or hb symptoms. No unusual exp hx or h/o childhood pna/ asthma or knowledge of premature birth.  Sleeping ok without nocturnal  or early am exacerbation  of respiratory  c/o's or need for noct saba. Also denies any obvious fluctuation of symptoms with weather or environmental changes or other aggravating or alleviating factors except as outlined above   Current Medications, Allergies, Complete Past Medical History, Past Surgical History, Family History, and Social History were reviewed in [employer] record.  ROS  The following are not active complaints unless bolded sore throat, dysphagia, dental problems, itching, sneezing,  nasal congestion or excess/ purulent secretions, ear ache,   fever, chills, sweats, unintended wt loss, classically pleuritic or exertional cp,  orthopnea pnd or leg swelling, presyncope, palpitations, abdominal pain, anorexia, nausea, vomiting, diarrhea  or change in bowel or bladder habits, change in stools or urine, dysuria,hematuria,  rash, arthralgias, visual complaints, headache, numbness, weakness or ataxia or problems with walking or coordination,  change in mood/affect or memory.           Objective:   Physical Exam  amb wf nad  Very hoarse "all my life"     05/05/2017        214   02/02/2017      213   10/06/16 221 lb 6.4 oz (100.4 kg)  10/05/16 218 lb 3.2 oz (99 kg)  08/30/16 215 lb (97.5 kg)    Vital signs reviewed - Note on arrival 02 sats  96% on RA     HEENT: nl dentition, turbinates, and oropharynx. Nl external ear canals without cough reflex   NECK  :  without JVD/Nodes/TM/ nl carotid upstrokes bilaterally   LUNGS: no acc muscle use,  Nl contour chest which is clear to A and P bilaterally without cough on insp or exp maneuvers with prominent pseudowheezing    CV:  RRR  no s3 or murmur  or increase in P2, nad no edema   ABD:  Quite obese but soft and nontender with nl inspiratory excursion in the supine position. No bruits or organomegaly appreciated, bowel sounds nl  MS:  Nl gait/ ext warm without deformities, calf tenderness, cyanosis or clubbing No obvious joint restrictions   SKIN: warm and dry without lesions    NEURO:  alert, approp, nl sensorium with  no motor or cerebellar deficits apparent.       CXR PA and Lateral:   02/02/2017 :    I personally reviewed images and agree with radiology impression as follows:     Mediastinum and hilar structures are normal. Mild left base pleuroparenchymal thickening consistent scarring again noted. Superimposed mild infiltrate cannot be excluded. Stable cardiomegaly. No pulmonary venous congestion. Prior cervicothoracic spine fusion.     Labs ordered 05/05/2017  Allergy profile               Assessment & Plan:

## 2017-05-05 NOTE — Assessment & Plan Note (Signed)
Changed metaprolol  To bisprolol 05/05/2017 due to ? Asthma component   .Strongly prefer in this setting: Bystolic, the most beta -1  selective Beta blocker available in sample form, with bisoprolol the most selective generic choice  on the market.   Try bisoprolol 5 mg daily to increase to bid if not maintaining good control

## 2017-05-06 ENCOUNTER — Ambulatory Visit: Payer: Medicare Other | Admitting: Gastroenterology

## 2017-05-06 LAB — RESPIRATORY ALLERGY PANEL REGION II W/ RFLX: ~~LOC~~
Allergen, A. alternata, m6: <0.1 kU/L
Allergen, C. Herbarum, M2: <0.1 kU/L
Allergen, Cedar tree, t12: <0.1 kU/L
Allergen, Comm Silver Birch, t9: <0.1 kU/L
Allergen, Cottonwood, t14: <0.1 kU/L
Allergen, D pternoyssinus,d7: <0.1 kU/L
Allergen, Mouse Urine Protein, e78: <0.1 kU/L
Allergen, Mulberry, t76: <0.1 kU/L
Allergen, Oak,t7: <0.1 kU/L
Allergen, P. notatum, m1: <0.1 kU/L
Aspergillus fumigatus, m3: <0.1 kU/L
Bermuda Grass: <0.1 kU/L
Box Elder IgE: <0.1 kU/L
Cat Dander: <0.1 kU/L
Cockroach: <0.1 kU/L
Common Ragweed: <0.1 kU/L
D. farinae: <0.1 kU/L
Dog Dander: <0.1 kU/L
Elm IgE: <0.1 kU/L
IgE (Immunoglobulin E), Serum: <2 kU/L
Johnson Grass: <0.1 kU/L
Pecan/Hickory Tree IgE: <0.1 kU/L
Rough Pigweed  IgE: <0.1 kU/L
Sheep Sorrel IgE: <0.1 kU/L
Timothy Grass: <0.1 kU/L

## 2017-05-06 NOTE — Progress Notes (Signed)
Spoke with pt and notified of results per Dr. Wert. Pt verbalized understanding and denied any questions. 

## 2017-05-10 ENCOUNTER — Telehealth: Payer: Self-pay

## 2017-05-10 NOTE — Telephone Encounter (Signed)
Pt called office wanting to speak to AB. States she is "loosing control of her bowels". Stool is soft. She finished Amitiza 57mcg samples. She thinks the Amitiza helped. Her bottom is raw. She used Preparation H last night but it didn't help. Proctozone 2.5 cream that AB gave her helped for a few days. She sees blood if she wipes a lot.  Routing to AB.

## 2017-05-11 NOTE — Telephone Encounter (Signed)
Keep taking the proctozone BID. Take the Amitiza BID. Is she interested in hemorrhoid banding.

## 2017-05-12 NOTE — Telephone Encounter (Signed)
Tried to call pt- NA- LMOM 

## 2017-05-17 ENCOUNTER — Ambulatory Visit (INDEPENDENT_AMBULATORY_CARE_PROVIDER_SITE_OTHER): Payer: Medicare Other | Admitting: Physical Medicine and Rehabilitation

## 2017-05-18 NOTE — Telephone Encounter (Signed)
Tried to call pt- [facility] with recommendations. Asked her to call back if she would like to schedule hemorrhoid banding.  Erline Levine or Manuela Schwartz, please schedule her if she calls back.

## 2017-05-27 ENCOUNTER — Encounter: Payer: Medicare Other | Admitting: Adult Health

## 2017-06-02 ENCOUNTER — Ambulatory Visit: Payer: Medicare Other

## 2017-06-02 DIAGNOSIS — I6523 Occlusion and stenosis of bilateral carotid arteries: Secondary | ICD-10-CM

## 2017-06-04 LAB — VAS US CAROTID
LEFT ECA DIAS: 12 cm/s
LEFT VERTEBRAL DIAS: -10 cm/s
Left CCA dist dias: 20 cm/s
Left CCA dist sys: 65 cm/s
Left CCA prox dias: 24 cm/s
Left CCA prox sys: 100 cm/s
Left ICA dist dias: -30 cm/s
Left ICA dist sys: -77 cm/s
Left ICA prox dias: 18 cm/s
Left ICA prox sys: 58 cm/s
RIGHT ECA DIAS: 12 cm/s
RIGHT VERTEBRAL DIAS: -13 cm/s
Right CCA prox dias: 8 cm/s
Right CCA prox sys: 49 cm/s
Right cca dist sys: -69 cm/s

## 2017-06-06 ENCOUNTER — Telehealth: Payer: Self-pay

## 2017-06-06 NOTE — Telephone Encounter (Signed)
-----   Message from Acquanetta Chain, LPN sent at 4/53/6468  1:07 PM EDT -----   ----- Message ----- From: Satira Sark, MD Sent: 06/05/2017   7:45 AM To: Merlene Laughter, LPN  Results reviewed. Stable 1-39% ICA stenosis, bilaterally. Continue medical therapy. A copy of this test should be forwarded to [facility].

## 2017-06-06 NOTE — Telephone Encounter (Signed)
Patient notified. Routed to PCP 

## 2017-06-16 ENCOUNTER — Ambulatory Visit (INDEPENDENT_AMBULATORY_CARE_PROVIDER_SITE_OTHER): Payer: Medicare Other | Admitting: Gastroenterology

## 2017-06-16 VITALS — BP 112/81 | HR 78 | Temp 97.4°F | Ht 65.0 in | Wt 216.2 lb

## 2017-06-16 DIAGNOSIS — R1030 Lower abdominal pain, unspecified: Secondary | ICD-10-CM

## 2017-06-16 MED ORDER — HYOSCYAMINE SULFATE 0.125 MG SL SUBL: 0.1250 mg | SUBLINGUAL_TABLET | SUBLINGUAL | 0 refills | Status: DC | PRN

## 2017-06-16 NOTE — Patient Instructions (Signed)
I have sent in a medication called "levsin" to the pharmacy to take every 4-6 hours as needed for abdominal cramping. Please let me know if you have constipation from this.  I will see you in 6 weeks!

## 2017-06-16 NOTE — Progress Notes (Signed)
Referring Provider: [facility] Primary Care Physician:  [facility], [place] Primary GI: Dr. Gala Romney   Chief Complaint  Patient presents with  . Abdominal Pain    HPI:   Michelle Zavala is a 71 y.o. female presenting today with a history of chronic abdominal pain, diarrhea, dysphagia. 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 Progression of SMA disease could cause issues, but clinical monitoring recommended per IR. She was seen by Vascular Surgery due to renal artery disease as well. Medical therapy recommended. Symptomatic fat-containing ventral hernia noted at visit in March 2017, undergoing hernia repair in March 2017. Recently completed TCS/EGD with negative segmental colonic biopsies, needing surveillance in 5 years due to colonic adenomas. EGD with empiric dilatation, small hiatal hernia. Celiac serologies negative. 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 Negative hydrogen breath test for small bowel bacterial overgrowth.   States some lower abdominal pain at times with BM. Will go and be "ok" and then the next time she goes will be #5 in "pieces". Not diarrhea. Denies constipation. Eating "light" ice cream. BM every day. No straining. Will sit on toilet, have BM, and then get sick on her stomach. Not all the time. Significant psychosocial and personal stressors. Son with stage IV colon cancer, and daughter with other issues resulting in strained relationship. Wonders if stress is contributing.   Past Medical History:  Diagnosis Date  . Anxiety   . Arthritis   . C2 cervical fracture ([place]) 09/17/13   Traumatic fracture witth minimal displacement  . Chronic lung disease    Fibrosis - Dr. Koleen Nimrod  . COPD (chronic obstructive pulmonary disease) ([place])   . Coronary atherosclerosis of native coronary artery    Mild atherosclerosis 3/10, LVEF 60-65%  . Depression   . Diverticulosis   . Essential  hypertension, benign   . GERD (gastroesophageal reflux disease)   . PSVT (paroxysmal supraventricular tachycardia) ([place])     Past Surgical History:  Procedure Laterality Date  . ABDOMINAL HYSTERECTOMY    . ANTERIOR RELEASE VERTEBRAL BODY W/ POSTERIOR FUSION    . BACTERIAL OVERGROWTH TEST N/A 02/19/2016   Procedure: BACTERIAL OVERGROWTH TEST;  Surgeon: Daneil Dolin, MD;  [facility];  Service: Endoscopy;  Laterality: N/A;  0700  . BIOPSY N/A 08/04/2015   Procedure: BIOPSY;  Surgeon: Daneil Dolin, MD;  Location: [facility];  Service: Endoscopy;  Laterality: N/A;  Gastric  . BIOPSY  02/21/2017   Procedure: BIOPSY;  Surgeon: Daneil Dolin, MD;  [facility];  Service: Endoscopy;;  ascending colon biopsy   . CESAREAN SECTION    . CHOLECYSTECTOMY  1973  . COLONOSCOPY  June 2016   Dr. Britta Mccreedy: moderate diverticulosis in sigmoid colon, surveillance in 5 years   . COLONOSCOPY WITH PROPOFOL N/A 02/21/2017   Dr. Gala Romney: diverticulosis in sigmoid colon, tubular adenomas, segmental biopsies benign. Surveillance in 5 years   . ESOPHAGOGASTRODUODENOSCOPY (EGD) WITH PROPOFOL N/A 08/04/2015   Dr. Rourk:abnormal gastric mucosa s/p biopsy. Reactive gastropathy. Negative H.pylori  . ESOPHAGOGASTRODUODENOSCOPY (EGD) WITH PROPOFOL N/A 02/21/2017   Dr. Gala Romney: empiric esophageal dilatation, small hiatal hernia, otherwise normal  . INCISIONAL HERNIA REPAIR N/A 12/03/2015   Procedure: [name] HERNIORRHAPHY WITH MESH;  Surgeon: Aviva Signs, MD;  Location: [facility];  Service: General;  Laterality: N/A;  . INSERTION OF MESH  12/03/2015   Procedure: INSERTION OF MESH;  Surgeon: Aviva Signs, MD;  Location: [facility];  Service: General;;  . [name] DILATION N/A 02/21/2017   Procedure: [name] DILATION;  Surgeon: Daneil Dolin, MD;  [facility];  Service: Endoscopy;  Laterality: N/A;  . POLYPECTOMY  02/21/2017   Procedure: POLYPECTOMY;  Surgeon: Daneil Dolin, MD;  [facility];  Service: Endoscopy;;  hepatic flexure polyp cs  . TMJ ARTHROSCOPY Bilateral 02/04/2015   Procedure: BILATERAL TEMPOROMANDIBULAR JOINT (TMJ) ARTHROSCOPY MENISECTOMY WITH FAT GRAFT FROM ABDOMEN ;  Surgeon: Diona Browner, DDS;  Location: [facility];  Service: Oral Surgery;  Laterality: Bilateral;  . TOTAL KNEE ARTHROPLASTY Bilateral   . TUBAL LIGATION      Current Outpatient Prescriptions  Medication Sig Dispense Refill  . acetaminophen (TYLENOL) 650 MG CR tablet Take 1,300 mg by mouth at bedtime as needed for pain.     [name] albuterol (PROAIR HFA) 108 (90 Base) MCG/ACT inhaler Inhale 2 puffs into the lungs every 4 (four) hours as needed for wheezing or shortness of breath.    . alfuzosin (UROXATRAL) 10 MG 24 hr tablet Take 10 mg by mouth daily with breakfast.    . amitriptyline (ELAVIL) 75 MG tablet Take 75 mg by mouth at bedtime.  6  . famotidine (PEPCID) 20 MG tablet TAKE ONE TABLET BY MOUTH AT BEDTIME. 30 tablet 0  . fluticasone (FLONASE) 50 MCG/ACT nasal spray Place 2 sprays into both nostrils daily as needed for allergies or rhinitis.    . hydrochlorothiazide (HYDRODIURIL) 25 MG tablet Take 25 mg by mouth daily.    [name] ipratropium-albuterol (DUONEB) 0.5-2.5 (3) MG/3ML SOLN Take 3 mLs by nebulization every 4 (four) hours as needed.    [name] LORazepam (ATIVAN) 2 MG tablet Take by mouth. Takes 2mg  in the morning and 4mg  at night    . meclizine (ANTIVERT) 25 MG tablet Take 25 mg by mouth daily as needed for dizziness.    . metoprolol succinate (TOPROL-XL) 50 MG 24 hr tablet Take 50 mg by mouth 2 (two) times daily.  3  . ondansetron (ZOFRAN) 4 MG tablet Take 1 tablet (4 mg total) by mouth every 8 (eight) hours as needed for nausea or vomiting. 30 tablet 1  . pantoprazole (PROTONIX) 40 MG tablet Take 1 tablet (40 mg total) by mouth 2 (two) times daily. 60 tablet 1  . potassium chloride SA (K-DUR,KLOR-CON) 20 MEQ tablet TAKE TWO (2) TABLETS BY MOUTH DAILY 60 tablet 3  . sertraline (ZOLOFT) 100 MG tablet  Take 100 mg by mouth daily.     . vitamin B-12 (CYANOCOBALAMIN) 100 MCG tablet Take 100 mcg by mouth daily.    . Vitamin D, Ergocalciferol, (DRISDOL) 50000 units CAPS capsule Take 50,000 Units by mouth every 7 (seven) days. Saturday    . hyoscyamine (LEVSIN SL) 0.125 MG SL tablet Place 1 tablet (0.125 mg total) under the tongue every 4 (four) hours as needed. 30 tablet 0   No current facility-administered medications for this visit.     Allergies as of 06/16/2017 - Review Complete 05/05/2017  Allergen Reaction Noted  . Sulfonamide derivatives Hives     Family History  Problem Relation Age of Onset  . Coronary artery disease Father        Premature  . Heart disease Father   . Coronary artery disease Brother        Premature  . Coronary artery disease Sister        Premature  . Colon cancer Neg Hx     Social  History   Social History  . Marital status: Widowed    Spouse name: N/A  . Number of children: 3  . Years of education: 7   Occupational History  . Retired    Social History Main Topics  . Smoking status: Never Smoker  . Smokeless tobacco: Never Used  . Alcohol use No  . Drug use: No  . Sexual activity: No   Other Topics Concern  . Not on file   Social History Narrative   Occasionally drinks [employer]     Review of Systems: Gen: Denies fever, chills, anorexia. Denies fatigue, weakness, weight loss.  CV: Denies chest pain, palpitations, syncope, peripheral edema, and claudication. Resp: Denies dyspnea at rest, cough, wheezing, coughing up blood, and pleurisy. GI: see HPI  Derm: Denies rash, itching, dry skin Psych: see HPI  Heme: Denies bruising, bleeding, and enlarged lymph nodes.  Physical Exam: BP 112/81   Pulse 78   Temp (!) 97.4 F (36.3 C)   Ht 5\' 5"  (1.651 m)   Wt 216 lb 3.2 oz (98.1 kg)   BMI 35.98 kg/m  General:   Alert and oriented. No distress noted. Tearful when talking about family concerns.  Head:  Normocephalic and atraumatic. Eyes:   Conjuctiva clear without scleral icterus. Mouth:  Oral mucosa pink and moist.  Abdomen:  +BS, soft, mild TTP upper abdomen and non-distended. No rebound or guarding. No HSM or masses noted. Msk:  Symmetrical without gross deformities. Normal posture. Extremities:  Without edema. Neurologic:  Alert and  oriented x4 Psych:  Alert and cooperative. Normal mood and affect.

## 2017-06-17 ENCOUNTER — Encounter: Payer: Self-pay | Admitting: Gastroenterology

## 2017-06-17 NOTE — Assessment & Plan Note (Signed)
Chronic abdominal pain with extensive prior evaluation as noted in HPI. Denies constipation. Seems to have more IBS symptoms at this point. Stress playing a large role. Declining any counseling for family issues at this time, but she knows this can be pursued if desired. Add Levsin prn. Continue PPI BID. Return in 6 weeks.

## 2017-06-21 NOTE — Progress Notes (Signed)
CC'ED TO PCP 

## 2017-06-29 ENCOUNTER — Ambulatory Visit: Payer: Medicare Other | Admitting: Gastroenterology

## 2017-07-15 ENCOUNTER — Emergency Department (HOSPITAL_COMMUNITY)
Admission: EM | Admit: 2017-07-15 | Discharge: 2017-07-15 | Disposition: A | Discharge status: Home / Self Care | Payer: Medicare Other

## 2017-07-29 NOTE — Progress Notes (Signed)
Cardiology Office Note  Date: 08/01/2017   ID: Michelle Zavala, DOB 21-Oct-1945, MRN [id number]  PCP: Octavio Graves, DO  Primary Cardiologist: Rozann Lesches, MD   Chief Complaint  Patient presents with  . PSVT    History of Present Illness: Michelle Zavala is a 71 y.o. female last seen in May.  She presents for a routine follow-up visit.  She states that she has a general lack of energy, no recurring chest pain or palpitations however.  She has had some tingling in her arms and legs.  She reports that she was supposed to have a visit with Dr. Melina Copa in October but had to cancel it.  She has not had a recent physical.  I reviewed her medications.  Cardiac regimen includes Toprol-XL, HCTZ, and potassium supplements.  I personally reviewed her ECG today which shows a sinus rhythm with leftward axis and poor R wave progression.  Past Medical History:  Diagnosis Date  . Anxiety   . Arthritis   . C2 cervical fracture ([place]) 09/17/13   Traumatic fracture witth minimal displacement  . Chronic lung disease    Fibrosis - Dr. Koleen Nimrod  . COPD (chronic obstructive pulmonary disease) ([place])   . Coronary atherosclerosis of native coronary artery    Mild atherosclerosis 3/10, LVEF 60-65%  . Depression   . Diverticulosis   . Essential hypertension, benign   . GERD (gastroesophageal reflux disease)   . PSVT (paroxysmal supraventricular tachycardia) ([place])     Past Surgical History:  Procedure Laterality Date  . ABDOMINAL HYSTERECTOMY    . ANTERIOR RELEASE VERTEBRAL BODY W/ POSTERIOR FUSION    . BACTERIAL OVERGROWTH TEST N/A 02/19/2016   Performed by Daneil Dolin, MD at [facility]  . BILATERAL TEMPOROMANDIBULAR JOINT (TMJ) ARTHROSCOPY MENISECTOMY WITH FAT GRAFT FROM ABDOMEN Bilateral 02/04/2015   Performed by Diona Browner, DDS at [facility]  . BIOPSY  02/21/2017   Performed by Daneil Dolin, MD at [facility]  . BIOPSY N/A 08/04/2015   Performed by D[facility]il Dolin, MD at [facility]  .  CESAREAN SECTION    . CHOLECYSTECTOMY  1973  . COLONOSCOPY  June 2016   Dr. Britta Mccreedy: moderate diverticulosis in sigmoid colon, surveillance in 5 years   . COLONOSCOPY WITH PROPOFOL N/A 02/21/2017   Performed by Daneil Dolin, MD at [facility]  . ESOPHAGOGASTRODUODENOSCOPY (EGD) WITH PROPOFOL N/A 02/21/2017   Performed by Daneil Dolin, MD at [facility]  . ESOPHAGOGASTRODUODENOSCOPY (EGD) WITH PROPOFOL N/A 08/04/2015   Performed by Daneil Dolin, MD at [facility]  . INCISIONAL HERNIORRHAPHY WITH MESH N/A 12/03/2015   Performed by Aviva Signs, MD at [facility]  . INSERTION OF MESH  12/03/2015   Performed by Aviva Signs, MD at [facility]  . MALONEY DILATION N/A 02/21/2017   Performed by Daneil Dolin, MD at [facility]  . POLYPECTOMY  02/21/2017   Performed by Daneil Dolin, MD at [facility]  . TOTAL KNEE ARTHROPLASTY Bilateral   . TUBAL LIGATION      Current Outpatient Medications  Medication Sig Dispense Refill  . acetaminophen (TYLENOL) 650 MG CR tablet Take 1,300 mg by mouth at bedtime as needed for pain.     [name] albuterol (PROAIR HFA) 108 (90 Base) MCG/ACT inhaler Inhale 2 puffs into the lungs every 4 (four) hours as needed for wheezing or shortness of breath.    . alfuzosin (UROXATRAL) 10 MG  24 hr tablet Take 10 mg by mouth daily with breakfast.    . amitriptyline (ELAVIL) 75 MG tablet Take 75 mg by mouth at bedtime.  6  . famotidine (PEPCID) 20 MG tablet TAKE ONE TABLET BY MOUTH AT BEDTIME. 30 tablet 0  . fluticasone (FLONASE) 50 MCG/ACT nasal spray Place 2 sprays into both nostrils daily as needed for allergies or rhinitis.    . hydrochlorothiazide (HYDRODIURIL) 25 MG tablet Take 25 mg by mouth daily.    . hyoscyamine (LEVSIN SL) 0.125 MG SL tablet Place 1 tablet (0.125 mg total) under the tongue every 4 (four) hours as needed. 30 tablet 0  . ipratropium-albuterol (DUONEB) 0.5-2.5 (3) MG/3ML SOLN Take 3 mLs by nebulization every 4 (four) hours as needed.    [name] LORazepam  (ATIVAN) 2 MG tablet Take by mouth. Takes 2mg  in the morning and 4mg  at night    . meclizine (ANTIVERT) 25 MG tablet Take 25 mg by mouth daily as needed for dizziness.    . metoprolol succinate (TOPROL-XL) 50 MG 24 hr tablet Take 50 mg by mouth 2 (two) times daily.  3  . ondansetron (ZOFRAN) 4 MG tablet Take 1 tablet (4 mg total) by mouth every 8 (eight) hours as needed for nausea or vomiting. 30 tablet 1  . pantoprazole (PROTONIX) 40 MG tablet Take 1 tablet (40 mg total) by mouth 2 (two) times daily. 60 tablet 1  . potassium chloride SA (K-DUR,KLOR-CON) 20 MEQ tablet TAKE TWO (2) TABLETS BY MOUTH DAILY 60 tablet 3  . sertraline (ZOLOFT) 100 MG tablet Take 100 mg by mouth daily.     . vitamin B-12 (CYANOCOBALAMIN) 100 MCG tablet Take 100 mcg by mouth daily.    . Vitamin D, Ergocalciferol, (DRISDOL) 50000 units CAPS capsule Take 50,000 Units by mouth every 7 (seven) days. Saturday     No current facility-administered medications for this visit.    Allergies:  Sulfonamide derivatives   Social History: The patient  reports that  has never smoked. she has never used smokeless tobacco. She reports that she does not drink alcohol or use drugs.   ROS:  Please see the history of present illness. Otherwise, complete review of systems is positive for stable dyspnea on exertion, no recent cough or wheezing.  All other systems are reviewed and negative.   Physical Exam: VS:  BP (!) 134/102   Pulse 79   Ht 5\' 5"  (1.651 m)   Wt 217 lb (98.4 kg)   SpO2 97%   BMI 36.11 kg/m , BMI Body mass index is 36.11 kg/m.  Wt Readings from Last 3 Encounters:  08/01/17 217 lb (98.4 kg)  06/16/17 216 lb 3.2 oz (98.1 kg)  05/05/17 214 lb 3.2 oz (97.2 kg)    General: Obese woman, appears comfortable at rest. HEENT: Conjunctiva and lids normal, oropharynx clear. Neck: Supple, no elevated JVP or carotid bruits, no thyromegaly. Lungs: Clear to auscultation, nonlabored breathing at rest. Cardiac: Regular rate and  rhythm, no S3 or gallop. Abdomen: Soft, nontender, bowel sounds present. Extremities: No pitting edema, distal pulses 2+. Skin: Warm and dry. Musculoskeletal: No kyphosis. Neuropsychiatric: Alert and oriented x3, affect grossly appropriate.  ECG: I personally reviewed the tracing from 08/30/2016 which showed sinus rhythm with left anterior fascicular block and borderline low voltage.  Recent Labwork: 08/30/2016: ALT 21; AST 26; B Natriuretic Peptide 131.0 02/02/2017: Pro B Natriuretic peptide (BNP) 71.0; TSH 1.54 02/17/2017: BUN 16; Creatinine, Ser 1.13; Potassium 3.8; Sodium 136 05/05/2017: Hemoglobin  13.2; Platelets 265.0   Other Studies Reviewed Today:  Carotid Dopplers 06/02/2017: Stable 1-39% bilateral ICA stenoses.  Echocardiogram 08/11/2016: Study Conclusions  - Left ventricle: The cavity size was normal. Wall thickness was   increased in a pattern of mild LVH. Systolic function was normal.   The estimated ejection fraction was in the range of 55% to 60%.   Doppler parameters are consistent with abnormal left ventricular   relaxation (grade 1 diastolic dysfunction). - Aortic valve: There was mild regurgitation. Valve area (VTI):   2.42 cm^2. Valve area (Vmax): 2.22 cm^2. Valve area (Vmean): 2.29   cm^2. - Technically difficult study.  Assessment and Plan:  1.  PSVT, symptomatically controlled on Toprol-XL.  ECG reviewed.  Continue with observation.  2.  History of mild CAD based on previous workup.  No active angina symptoms and LVEF normal.  Continue aspirin and statin.  3.  Essential hypertension, blood pressure elevated today.  Patient reports compliance with her medications.  She states that she lacks energy.  I have asked that she follow-up with her PCP for a physical and lab work.  4.  COPD, following with Dr. Melvyn Novas.  Current medicines were reviewed with the patient today.   Orders Placed This Encounter  Procedures  . EKG 12-Lead    Disposition: Follow-up in  6 months.  Signed, Satira Sark, MD, [facility] 08/01/2017 11:15 AM    [facility] [place], [place], [place] [address] Phone: [phone]; Fax: [phone]

## 2017-08-01 ENCOUNTER — Encounter: Payer: Self-pay | Admitting: Cardiology

## 2017-08-01 ENCOUNTER — Ambulatory Visit (INDEPENDENT_AMBULATORY_CARE_PROVIDER_SITE_OTHER): Payer: Medicare Other | Admitting: Cardiology

## 2017-08-01 VITALS — BP 134/102 | HR 79 | Ht 65.0 in | Wt 217.0 lb

## 2017-08-01 DIAGNOSIS — I471 Supraventricular tachycardia, unspecified: Secondary | ICD-10-CM

## 2017-08-01 DIAGNOSIS — I10 Essential (primary) hypertension: Secondary | ICD-10-CM

## 2017-08-01 DIAGNOSIS — I2510 Atherosclerotic heart disease of native coronary artery without angina pectoris: Secondary | ICD-10-CM

## 2017-08-01 DIAGNOSIS — J449 Chronic obstructive pulmonary disease, unspecified: Secondary | ICD-10-CM | POA: Diagnosis not present

## 2017-08-01 NOTE — Patient Instructions (Signed)

## 2017-08-02 ENCOUNTER — Other Ambulatory Visit: Payer: Self-pay | Admitting: Neurological Surgery

## 2017-08-02 DIAGNOSIS — M47816 Spondylosis without myelopathy or radiculopathy, lumbar region: Secondary | ICD-10-CM

## 2017-08-09 ENCOUNTER — Ambulatory Visit
Admission: RE | Admit: 2017-08-09 | Discharge: 2017-08-09 | Disposition: A | Discharge status: Home / Self Care | Payer: Medicare Other | Source: Ambulatory Visit | Attending: Neurological Surgery | Admitting: Neurological Surgery

## 2017-08-09 ENCOUNTER — Other Ambulatory Visit: Payer: Self-pay | Admitting: Neurological Surgery

## 2017-08-09 DIAGNOSIS — M47816 Spondylosis without myelopathy or radiculopathy, lumbar region: Secondary | ICD-10-CM

## 2017-08-11 ENCOUNTER — Encounter: Payer: Self-pay | Admitting: Gastroenterology

## 2017-08-11 ENCOUNTER — Ambulatory Visit (INDEPENDENT_AMBULATORY_CARE_PROVIDER_SITE_OTHER): Payer: Medicare Other | Admitting: Gastroenterology

## 2017-08-11 ENCOUNTER — Other Ambulatory Visit: Payer: Self-pay

## 2017-08-11 VITALS — BP 139/94 | HR 102 | Temp 97.2°F | Ht 65.0 in | Wt 213.0 lb

## 2017-08-11 DIAGNOSIS — R1010 Upper abdominal pain, unspecified: Secondary | ICD-10-CM | POA: Diagnosis not present

## 2017-08-11 DIAGNOSIS — Z7689 Persons encountering health services in other specified circumstances: Secondary | ICD-10-CM

## 2017-08-11 LAB — BASIC METABOLIC PANEL WITH GFR
BUN/Creatinine Ratio: 15 (calc) (ref 6–22)
BUN: 17 mg/dL (ref 7–25)
CO2: 27 mmol/L (ref 20–32)
Calcium: 9.9 mg/dL (ref 8.6–10.4)
Chloride: 99 mmol/L (ref 98–110)
Creat: 1.11 mg/dL — ABNORMAL HIGH (ref 0.60–0.93)
GFR, Est African American: 58 mL/min/{1.73_m2} — ABNORMAL LOW (ref >=60)
GFR, Est Non African American: 50 mL/min/{1.73_m2} — ABNORMAL LOW (ref >=60)
Glucose, Bld: 174 mg/dL — ABNORMAL HIGH (ref 65–99)
Potassium: 3.7 mmol/L (ref 3.5–5.3)
Sodium: 137 mmol/L (ref 135–146)

## 2017-08-11 NOTE — Progress Notes (Signed)
Referring Provider: [facility] [place] Primary Care Physician:  Octavio Graves, DO Primary GI: Dr. Gala Romney   Chief Complaint  Patient presents with  . Dysphagia    improved  . upper abdominal pain    anything she eats  . Constipation    x few days. Eating activia yogurt     HPI:   Michelle Zavala is a 71 y.o. female presenting today with a history of chronic abdominal pain, diarrhea, dysphagia. 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 Progression of SMA disease could cause issues, but clinical monitoring recommended per IR. She was seen by Vascular Surgery due to renal artery disease as well. Medical therapy recommended. Symptomatic fat-containing ventral hernia noted at visit in March 2017, undergoing hernia repair in March 2017. Recently completed TCS/EGD with negative segmental colonic biopsies, needing surveillance in 5 years due to colonic adenomas. EGD with empiric dilatation, small hiatal hernia. Celiac serologies negative. 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 Negative hydrogen breath test for small bowel bacterial overgrowth.  Upper abdominal pain noted after eating. Activia helps. Had loose stool after eating Hibachi but then bound up. Taking Benefiber. No Miralax. Doesn't think she is taking Levsin. Notes aversion to food because of the pain. Associated nausea. Notes intermittent vomiting. Zofran as needed.   Several weeks ago, had 2-3 day episode of left arm numbness, left leg numbness, mouth tingling. Did not seek medical attention.   Past Medical History:  Diagnosis Date  . Anxiety   . Arthritis   . C2 cervical fracture ([place]) 09/17/13   Traumatic fracture witth minimal displacement  . Chronic lung disease    Fibrosis - Dr. Koleen Nimrod  . COPD (chronic obstructive pulmonary disease) ([place])   . Coronary atherosclerosis of native coronary artery    Mild atherosclerosis 3/10, LVEF 60-65%  .  Depression   . Diverticulosis   . Essential hypertension, benign   . GERD (gastroesophageal reflux disease)   . PSVT (paroxysmal supraventricular tachycardia) ([place])     Past Surgical History:  Procedure Laterality Date  . ABDOMINAL HYSTERECTOMY    . ANTERIOR RELEASE VERTEBRAL BODY W/ POSTERIOR FUSION    . BACTERIAL OVERGROWTH TEST N/A 02/19/2016   Procedure: BACTERIAL OVERGROWTH TEST;  Surgeon: Daneil Dolin, MD;  [facility];  Service: Endoscopy;  Laterality: N/A;  0700  . BIOPSY N/A 08/04/2015   Procedure: BIOPSY;  Surgeon: Daneil Dolin, MD;  Location: [facility];  Service: Endoscopy;  Laterality: N/A;  Gastric  . BIOPSY  02/21/2017   Procedure: BIOPSY;  Surgeon: Daneil Dolin, MD;  [facility];  Service: Endoscopy;;  ascending colon biopsy   . CESAREAN SECTION    . CHOLECYSTECTOMY  1973  . COLONOSCOPY  June 2016   Dr. Britta Mccreedy: moderate diverticulosis in sigmoid colon, surveillance in 5 years   . COLONOSCOPY WITH PROPOFOL N/A 02/21/2017   Dr. Gala Romney: diverticulosis in sigmoid colon, tubular adenomas, segmental biopsies benign. Surveillance in 5 years   . ESOPHAGOGASTRODUODENOSCOPY (EGD) WITH PROPOFOL N/A 08/04/2015   Dr. Rourk:abnormal gastric mucosa s/p biopsy. Reactive gastropathy. Negative H.pylori  . ESOPHAGOGASTRODUODENOSCOPY (EGD) WITH PROPOFOL N/A 02/21/2017   Dr. Gala Romney: empiric esophageal dilatation, small hiatal hernia, otherwise normal  . INCISIONAL HERNIA REPAIR N/A 12/03/2015   Procedure: [name] HERNIORRHAPHY WITH MESH;  Surgeon: Aviva Signs, MD;  Location: [facility];  Service: General;  Laterality: N/A;  . INSERTION OF MESH  12/03/2015  Procedure: INSERTION OF MESH;  Surgeon: Aviva Signs, MD;  Location: [facility];  Service: General;;  . [name] DILATION N/A 02/21/2017   Procedure: Keturah Shavers;  Surgeon: Daneil Dolin, MD;  [facility];  Service: Endoscopy;  Laterality: N/A;  . POLYPECTOMY  02/21/2017   Procedure: POLYPECTOMY;   Surgeon: Daneil Dolin, MD;  [facility];  Service: Endoscopy;;  hepatic flexure polyp cs  . TMJ ARTHROSCOPY Bilateral 02/04/2015   Procedure: BILATERAL TEMPOROMANDIBULAR JOINT (TMJ) ARTHROSCOPY MENISECTOMY WITH FAT GRAFT FROM ABDOMEN ;  Surgeon: Diona Browner, DDS;  Location: [facility];  Service: Oral Surgery;  Laterality: Bilateral;  . TOTAL KNEE ARTHROPLASTY Bilateral   . TUBAL LIGATION      Current Outpatient Medications  Medication Sig Dispense Refill  . acetaminophen (TYLENOL) 650 MG CR tablet Take 1,300 mg by mouth at bedtime as needed for pain.     [name] albuterol (PROAIR HFA) 108 (90 Base) MCG/ACT inhaler Inhale 2 puffs into the lungs every 4 (four) hours as needed for wheezing or shortness of breath.    . alfuzosin (UROXATRAL) 10 MG 24 hr tablet Take 10 mg by mouth daily with breakfast.    . amitriptyline (ELAVIL) 75 MG tablet Take 75 mg by mouth at bedtime.  6  . famotidine (PEPCID) 20 MG tablet TAKE ONE TABLET BY MOUTH AT BEDTIME. 30 tablet 0  . fluticasone (FLONASE) 50 MCG/ACT nasal spray Place 2 sprays into both nostrils daily as needed for allergies or rhinitis.    . hydrochlorothiazide (HYDRODIURIL) 25 MG tablet Take 25 mg by mouth daily.    . hyoscyamine (LEVSIN SL) 0.125 MG SL tablet Place 1 tablet (0.125 mg total) under the tongue every 4 (four) hours as needed. 30 tablet 0  . ipratropium-albuterol (DUONEB) 0.5-2.5 (3) MG/3ML SOLN Take 3 mLs by nebulization every 4 (four) hours as needed.    [name] LORazepam (ATIVAN) 2 MG tablet Take by mouth. Takes 2mg  in the morning and 4mg  at night    . meclizine (ANTIVERT) 25 MG tablet Take 25 mg by mouth daily as needed for dizziness.    . metoprolol succinate (TOPROL-XL) 50 MG 24 hr tablet Take 50 mg by mouth 2 (two) times daily.  3  . ondansetron (ZOFRAN) 4 MG tablet Take 1 tablet (4 mg total) by mouth every 8 (eight) hours as needed for nausea or vomiting. 30 tablet 1  . pantoprazole (PROTONIX) 40 MG tablet Take 1 tablet (40 mg total)  by mouth 2 (two) times daily. 60 tablet 1  . potassium chloride SA (K-DUR,KLOR-CON) 20 MEQ tablet TAKE TWO (2) TABLETS BY MOUTH DAILY 60 tablet 3  . sertraline (ZOLOFT) 100 MG tablet Take 100 mg by mouth daily.     . vitamin B-12 (CYANOCOBALAMIN) 100 MCG tablet Take 100 mcg by mouth daily.    . Vitamin D, Ergocalciferol, (DRISDOL) 50000 units CAPS capsule Take 50,000 Units by mouth every 7 (seven) days. Saturday     No current facility-administered medications for this visit.     Allergies as of 08/11/2017 - Review Complete 08/11/2017  Allergen Reaction Noted  . Sulfonamide derivatives Hives     Family History  Problem Relation Age of Onset  . Coronary artery disease Father        Premature  . Heart disease Father   . Coronary artery disease Brother        Premature  . Coronary artery disease Sister        Premature  .  Colon cancer Neg Hx     Social History   Socioeconomic History  . Marital status: Widowed    Spouse name: None  . Number of children: 3  . Years of education: 7  . Highest education level: None  Social Needs  . Financial resource strain: None  . Food insecurity - worry: None  . Food insecurity - inability: None  . Transportation needs - medical: None  . Transportation needs - non-medical: None  Occupational History  . Occupation: Retired  Tobacco Use  . Smoking status: Never Smoker  . Smokeless tobacco: Never Used  Substance and Sexual Activity  . Alcohol use: No    Alcohol/week: 0.0 oz  . Drug use: No  . Sexual activity: No    Birth control/protection: None, Surgical  Other Topics Concern  . None  Social History Narrative   Occasionally drinks [employer]     Review of Systems: Gen: Denies fever, chills, anorexia. Denies fatigue, weakness, weight loss.  CV: Denies chest pain, palpitations, syncope, peripheral edema, and claudication. Resp: Denies dyspnea at rest, cough, wheezing, coughing up blood, and pleurisy. GI: see HPI  Derm: Denies rash,  itching, dry skin Psych: Denies depression, anxiety, memory loss, confusion. No homicidal or suicidal ideation.  Heme: Denies bruising, bleeding, and enlarged lymph nodes.  Physical Exam: BP (!) 139/94   Pulse (!) 102   Temp (!) 97.2 F (36.2 C) (Oral)   Ht 5\' 5"  (1.651 m)   Wt 213 lb (96.6 kg)   BMI 35.45 kg/m  General:   Alert and oriented. No distress noted. Pleasant and cooperative.  Head:  Normocephalic and atraumatic. Eyes:  Conjuctiva clear without scleral icterus. Mouth:  Oral mucosa pink and moist.  Abdomen:  +BS, soft, non-tender and non-distended. No rebound or guarding. No HSM or masses noted. Msk:  Symmetrical without gross deformities. Normal posture. Extremities:  Without edema. Neurologic:  Alert and  oriented x4 Psych:  Alert and cooperative. Normal mood and affect.

## 2017-08-11 NOTE — Patient Instructions (Signed)
I have ordered an updated CT angiogram to check the vessels in your abdomen again.   Please have blood work done today, which checks your kidneys prior to the CT.  Please follow the LOW FODMAP diet (column on the left).   You may take a dose of Miralax as needed daily for constipation.  I hope you have a wonderful Christmas!

## 2017-08-11 NOTE — Patient Instructions (Signed)
Attempted to submit PA info for CT angio via [facility] website. No PA needed.

## 2017-08-12 NOTE — Assessment & Plan Note (Addendum)
70 year old female with chronic abdominal pain, now worsening. Known atherosclerotic disease on CTA several years ago with recommended monitoring after consultation with IR. No significant weight loss, but she does notice exacerbation with eating and aversion to eating. Will proceed with updated CT angiogram, as last on file was several years ago. For constipation, use Miralax daily prn. Continue with PPI. TCS/EGD on file. Will also refer to Nephrology due to CKD as her nephrologist has moved to a different location. Follow-up to be determined after review of imaging.

## 2017-08-13 ENCOUNTER — Other Ambulatory Visit: Payer: Medicare Other

## 2017-08-15 NOTE — Progress Notes (Signed)
cc'ed to pcp °

## 2017-08-23 ENCOUNTER — Ambulatory Visit (HOSPITAL_COMMUNITY): Payer: Medicare Other

## 2017-08-24 ENCOUNTER — Ambulatory Visit (HOSPITAL_COMMUNITY)
Admission: RE | Admit: 2017-08-24 | Discharge: 2017-08-24 | Disposition: A | Discharge status: Home / Self Care | Payer: Medicare Other | Source: Ambulatory Visit | Attending: Gastroenterology | Admitting: Gastroenterology

## 2017-08-24 DIAGNOSIS — K5730 Diverticulosis of large intestine without perforation or abscess without bleeding: Secondary | ICD-10-CM | POA: Insufficient documentation

## 2017-08-24 DIAGNOSIS — K760 Fatty (change of) liver, not elsewhere classified: Secondary | ICD-10-CM | POA: Diagnosis not present

## 2017-08-24 DIAGNOSIS — I700 Atherosclerosis of aorta: Secondary | ICD-10-CM | POA: Diagnosis not present

## 2017-08-24 DIAGNOSIS — R1010 Upper abdominal pain, unspecified: Secondary | ICD-10-CM | POA: Diagnosis not present

## 2017-08-24 MED ORDER — IOPAMIDOL (ISOVUE-370) INJECTION 76%
100.0000 mL | Freq: Once | INTRAVENOUS | Status: AC | PRN
  Administered 2017-08-24: 100 mL via INTRAVENOUS

## 2017-08-31 ENCOUNTER — Other Ambulatory Visit: Payer: Self-pay | Admitting: Gastroenterology

## 2017-08-31 ENCOUNTER — Other Ambulatory Visit: Payer: Self-pay | Admitting: Interventional Radiology

## 2017-08-31 DIAGNOSIS — K559 Vascular disorder of intestine, unspecified: Secondary | ICD-10-CM

## 2017-08-31 NOTE — Progress Notes (Signed)
Michelle Zavala: I reviewed with Dr. Earleen Newport (with IR). He recommends setting up for a consult with IR and mesenteric duplex exam prior to visit. The staff with IR is going to set her up for a visit and further evaluation. Please have her call us if she doesn't hear in the next few weeks.

## 2017-08-31 NOTE — Progress Notes (Signed)
There does not appear to be greater than 50% stenosis of SMA and celiac artery and IMA are patent. I will reach out to IR to see if she would be appropriate to be seen in consultation vs continued observation. Let's have her follow-up here in Feb 2019. Hope she is doing better.

## 2017-09-03 ENCOUNTER — Other Ambulatory Visit: Payer: Self-pay | Admitting: Cardiology

## 2017-09-19 ENCOUNTER — Telehealth: Payer: Self-pay | Admitting: Internal Medicine

## 2017-09-19 NOTE — Telephone Encounter (Signed)
[phone]  PATIENT RETURNED CALL, PLEASE CALL BACK

## 2017-09-19 NOTE — Telephone Encounter (Signed)
Pt walked in the office last week asking to speak to me in reference to a voicemail left. Pt was notified that Dr. Pasty Arch office was going to call to set an appointment up for further evaluation. Pt has upcoming appointments scheduled.

## 2017-09-22 ENCOUNTER — Ambulatory Visit
Admission: RE | Admit: 2017-09-22 | Discharge: 2017-09-22 | Disposition: A | Discharge status: Home / Self Care | Payer: Medicare Other | Source: Ambulatory Visit | Attending: Interventional Radiology | Admitting: Interventional Radiology

## 2017-09-22 ENCOUNTER — Encounter: Payer: Self-pay | Admitting: Internal Medicine

## 2017-09-22 ENCOUNTER — Encounter: Payer: Self-pay | Admitting: *Deleted

## 2017-09-22 DIAGNOSIS — K559 Vascular disorder of intestine, unspecified: Secondary | ICD-10-CM

## 2017-09-22 HISTORY — PX: IR RADIOLOGIST EVAL & MGMT: IMG5224

## 2017-09-22 NOTE — H&P (Signed)
Chief Complaint: Patient was seen in consultation today for  Chief Complaint  Patient presents with  . Advice Only    Mesenteric Ischemia    Referring Physician(s): Roseanne Kaufman  Supervising Physician: Corrie Mckusick  History of Present Illness: Michelle Zavala is a 72 y.o. female with a history of chronic abdominal pain and diarrhea.   She describes her pain as 'crampy'. She states 'it feels like I have to go to the bathroom'.  GI note states she has an aversion to food because of the pain. Today, she denies food fear and she denies any weight loss. She reports she eats whatever she wants.  CTA done 08/2015 showed the origin of SMA up to 50% narrowed but celiac and IMA were widely patent.   CTA was repeated 08/2017 which showed minimal calcified plaque at the origin of the superior mesenteric artery. There does not appear to be  greater than 50% narrowing at the origin of the SMA secondary to atherosclerotic plaque.  She recently has a colonoscopy/EGD with negative segmental colonic biopsies, needing surveillance in 5 years due to colonic adenomas. EGD with empiric dilatation, small hiatal hernia.   Celiac serologies were negative. Negative hydrogen breath test for small bowel bacterial overgrowth.  She also reports some nausea with intermittent vomiting and takes Zofran as needed.     Past Medical History:  Diagnosis Date  . Anxiety   . Arthritis   . C2 cervical fracture ([place]) 09/17/13   Traumatic fracture witth minimal displacement  . Chronic lung disease    Fibrosis - Dr. Koleen Nimrod  . COPD (chronic obstructive pulmonary disease) ([place])   . Coronary atherosclerosis of native coronary artery    Mild atherosclerosis 3/10, LVEF 60-65%  . Depression   . Diverticulosis   . Essential hypertension, benign   . GERD (gastroesophageal reflux disease)   . PSVT (paroxysmal supraventricular tachycardia) ([place])     Past Surgical History:  Procedure Laterality Date  .  ABDOMINAL HYSTERECTOMY    . ANTERIOR RELEASE VERTEBRAL BODY W/ POSTERIOR FUSION    . BACTERIAL OVERGROWTH TEST N/A 02/19/2016   Procedure: BACTERIAL OVERGROWTH TEST;  Surgeon: Daneil Dolin, MD;  [facility];  Service: Endoscopy;  Laterality: N/A;  0700  . BIOPSY N/A 08/04/2015   Procedure: BIOPSY;  Surgeon: Daneil Dolin, MD;  Location: [facility];  Service: Endoscopy;  Laterality: N/A;  Gastric  . BIOPSY  02/21/2017   Procedure: BIOPSY;  Surgeon: Daneil Dolin, MD;  [facility];  Service: Endoscopy;;  ascending colon biopsy   . CESAREAN SECTION    . CHOLECYSTECTOMY  1973  . COLONOSCOPY  June 2016   Dr. Britta Mccreedy: moderate diverticulosis in sigmoid colon, surveillance in 5 years   . COLONOSCOPY WITH PROPOFOL N/A 02/21/2017   Dr. Gala Romney: diverticulosis in sigmoid colon, tubular adenomas, segmental biopsies benign. Surveillance in 5 years   . ESOPHAGOGASTRODUODENOSCOPY (EGD) WITH PROPOFOL N/A 08/04/2015   Dr. Rourk:abnormal gastric mucosa s/p biopsy. Reactive gastropathy. Negative H.pylori  . ESOPHAGOGASTRODUODENOSCOPY (EGD) WITH PROPOFOL N/A 02/21/2017   Dr. Gala Romney: empiric esophageal dilatation, small hiatal hernia, otherwise normal  . INCISIONAL HERNIA REPAIR N/A 12/03/2015   Procedure: [name] HERNIORRHAPHY WITH MESH;  Surgeon: Aviva Signs, MD;  Location: [facility];  Service: General;  Laterality: N/A;  . INSERTION OF MESH  12/03/2015   Procedure: INSERTION OF MESH;  Surgeon: Aviva Signs, MD;  Location: [facility];  Service: General;;  . [name] DILATION N/A 02/21/2017  Procedure: MALONEY DILATION;  Surgeon: Daneil Dolin, MD;  [facility];  Service: Endoscopy;  Laterality: N/A;  . POLYPECTOMY  02/21/2017   Procedure: POLYPECTOMY;  Surgeon: Daneil Dolin, MD;  [facility];  Service: Endoscopy;;  hepatic flexure polyp cs  . TMJ ARTHROSCOPY Bilateral 02/04/2015   Procedure: BILATERAL TEMPOROMANDIBULAR JOINT (TMJ) ARTHROSCOPY MENISECTOMY WITH FAT GRAFT  FROM ABDOMEN ;  Surgeon: Diona Browner, DDS;  Location: [place];  Service: Oral Surgery;  Laterality: Bilateral;  . TOTAL KNEE ARTHROPLASTY Bilateral   . TUBAL LIGATION      Allergies: Sulfonamide derivatives  Medications: Prior to Admission medications   Medication Sig Start Date End Date Taking? Authorizing Provider  acetaminophen (TYLENOL) 650 MG CR tablet Take 1,300 mg by mouth at bedtime as needed for pain.    Yes [provider]  albuterol (PROAIR HFA) 108 (90 Base) MCG/ACT inhaler Inhale 2 puffs into the lungs every 4 (four) hours as needed for wheezing or shortness of breath.   Yes [provider]  alfuzosin (UROXATRAL) 10 MG 24 hr tablet Take 10 mg by mouth daily with breakfast.   Yes [provider]  amitriptyline (ELAVIL) 75 MG tablet Take 75 mg by mouth at bedtime. 01/27/15  Yes [provider]  famotidine (PEPCID) 20 MG tablet TAKE ONE TABLET BY MOUTH AT BEDTIME. 04/29/17  Yes Tanda Rockers, MD  hydrochlorothiazide (HYDRODIURIL) 25 MG tablet Take 25 mg by mouth daily.   Yes [provider]  hyoscyamine (LEVSIN SL) 0.125 MG SL tablet Place 1 tablet (0.125 mg total) under the tongue every 4 (four) hours as needed. 06/16/17  Yes Annitta Needs, NP  ipratropium-albuterol (DUONEB) 0.5-2.5 (3) MG/3ML SOLN Take 3 mLs by nebulization every 4 (four) hours as needed.   Yes [provider]  LORazepam (ATIVAN) 2 MG tablet Take by mouth. Takes 2mg  in the morning and 4mg  at night   Yes [provider]  meclizine (ANTIVERT) 25 MG tablet Take 25 mg by mouth daily as needed for dizziness.   Yes [provider]  metoprolol succinate (TOPROL-XL) 50 MG 24 hr tablet Take 50 mg by mouth 2 (two) times daily. 06/06/17  Yes [provider]  ondansetron (ZOFRAN) 4 MG tablet Take 1 tablet (4 mg total) by mouth every 8 (eight) hours as needed for nausea or vomiting. 04/15/17  Yes Annitta Needs, NP  pantoprazole (PROTONIX) 40 MG tablet  Take 1 tablet (40 mg total) by mouth 2 (two) times daily. 09/28/13  Yes Angiulli, Lavon Paganini, PA-C  potassium chloride SA (K-DUR,KLOR-CON) 20 MEQ tablet TAKE TWO (2) TABLETS BY MOUTH DAILY 03/24/17  Yes Tanda Rockers, MD  sertraline (ZOLOFT) 100 MG tablet Take 100 mg by mouth daily.    Yes [provider]  vitamin B-12 (CYANOCOBALAMIN) 100 MCG tablet Take 100 mcg by mouth daily.   Yes [provider]  Vitamin D, Ergocalciferol, (DRISDOL) 50000 units CAPS capsule Take 50,000 Units by mouth every 7 (seven) days. Saturday   Yes [provider]  fluticasone (FLONASE) 50 MCG/ACT nasal spray Place 2 sprays into both nostrils daily as needed for allergies or rhinitis.    [provider]  metoprolol succinate (TOPROL-XL) 50 MG 24 hr tablet TAKE ONE TABLET BY MOUTH TWICE DAILY. 09/05/17   Satira Sark, MD     Family History  Problem Relation Age of Onset  . Coronary artery disease Father        Premature  .  Heart disease Father   . Coronary artery disease Brother        Premature  . Coronary artery disease Sister        Premature  . Colon cancer Neg Hx     Social History   Socioeconomic History  . Marital status: Widowed    Spouse name: Not on file  . Number of children: 3  . Years of education: 7  . Highest education level: Not on file  Social Needs  . Financial resource strain: Not on file  . Food insecurity - worry: Not on file  . Food insecurity - inability: Not on file  . Transportation needs - medical: Not on file  . Transportation needs - non-medical: Not on file  Occupational History  . Occupation: Retired  Tobacco Use  . Smoking status: Never Smoker  . Smokeless tobacco: Never Used  Substance and Sexual Activity  . Alcohol use: No    Alcohol/week: 0.0 oz  . Drug use: No  . Sexual activity: No    Birth control/protection: None, Surgical  Other Topics Concern  . Not on file  Social History Narrative   Occasionally drinks [employer]eps[employer]      Review of Systems: A 12 point ROS discussed and pertinent positives are indicated in the HPI above.  All other systems are negative. Review of Systems  Vital Signs: BP (!) 160/103   Pulse 84   Temp 98.2 F (36.8 C) (Oral)   Resp 14   Ht 5\' 5"  (1.651 m)   Wt 216 lb (98 kg)   SpO2 97%   BMI 35.94 kg/m   Physical Exam  Constitutional: She is oriented to person, place, and time.  Obese, NAD  HENT:  Head: Normocephalic and atraumatic.  Eyes: EOM are normal.  Neck: Normal range of motion.  No carotid bruits heard  Cardiovascular: Normal rate, regular rhythm and normal heart sounds.  No murmur heard. Pulmonary/Chest: Effort normal and breath sounds normal. No stridor. No respiratory distress.  Abdominal: Soft. She exhibits no distension. There is no tenderness.  No abdominal bruits heard.  Musculoskeletal: Normal range of motion.  Neurological: She is alert and oriented to person, place, and time.  Skin: Skin is warm and dry.  Psychiatric: She has a normal mood and affect. Her behavior is normal. Judgment and thought content normal.  Vitals reviewed.   Mallampati Score:  2  Imaging: Ct Angio Abd/pel W/ And/or W/o  Result Date: 08/24/2017 CLINICAL DATA:  72 year old female with a history of chronic abdominal pain. Concern for ischemia EXAM: CTA ABDOMEN AND PELVIS wITHOUT AND WITH CONTRAST TECHNIQUE: Multidetector CT imaging of the abdomen and pelvis was performed using the standard protocol during bolus administration of intravenous contrast. Multiplanar reconstructed images and MIPs were obtained and reviewed to evaluate the vascular anatomy. CONTRAST:  173mL ISOVUE-370 IOPAMIDOL (ISOVUE-370) INJECTION 76% COMPARISON:  CT 05/16/2017, 06/23/2016 FINDINGS: VASCULAR Aorta: Tortuosity of the thoracic aorta with mild atherosclerotic changes. Diameter of the aorta at the aortic hiatus measures 2.8 cm. Mild tortuosity of the abdominal aorta. Greatest diameter of the abdominal aorta  measures 2.2 cm at the level of the IMA. Mild mixed calcified and soft plaque of the abdominal aorta. No dissection. No periaortic fluid. Celiac: Minimal atherosclerotic changes at the origin of the celiac artery. SMA: Minimal calcified plaque at the origin of the superior mesenteric artery. There does not appear to be Deneise Lever greater than 50% narrowing at the origin of the SMA secondary to atherosclerotic plaque. Ileal  colic artery and arcade arteries are patent. Renals: Mixed calcified and soft plaque at the origin of the right renal artery with developing stenosis, greater than 50%. Mild atherosclerotic changes at the origin of the left renal artery without significant stenosis. IMA: Atherosclerotic changes at the origin of the inferior mesenteric artery which remains patent. Left colic artery patent. Superior rectal artery patent. Right lower extremity: Mild tortuosity of the right iliac system. Common iliac artery patent. No dissection or aneurysm. Hypogastric artery patent as well as anterior and posterior branches. External iliac artery patent with no aneurysm or dissection. No significant atherosclerotic changes. Common femoral artery and the proximal SFA and profunda femoris patent. Left lower extremity: Mild tortuosity of the left iliac system. Common iliac artery patent. No dissection or aneurysm. Hypogastric artery patent as well as anterior and posterior branches. External iliac artery patent with no aneurysm or dissection. No significant atherosclerotic changes. Common femoral artery and the proximal SFA and profunda femoris patent. Veins: Unremarkable appearance of the venous system. Review of the MIP images confirms the above findings. NON-VASCULAR Lower chest: Unremarkable. Hepatobiliary: Diffusely decreased attenuation of liver parenchyma. Absent gallbladder Pancreas: Unremarkable pancreas Spleen: Unremarkable spleen Adrenals/Urinary Tract: Unremarkable appearance of adrenal glands. Right: Asymmetric  appearance of the right kidney compared to the left, with decreasing parenchymal volume and thinning of the cortex. In addition there are focal regions of cortical thinning of the lateral and medial cortex and inferior cortex. No hydronephrosis. No nephrolithiasis. Unremarkable course of the right ureter. Left: Left kidney without hydronephrosis or nephrolithiasis. Symmetric perfusion to the left kidney. Unremarkable course of the left ureter. Unremarkable appearance of the urinary bladder . Stomach/Bowel: Unremarkable stomach. Unremarkable small bowel. No abnormal distention. No transition point. Colonic diverticulae a without associated inflammatory changes. No evidence of abnormally distended colon. Appendix is not visualized, however, no inflammatory changes are present adjacent to the cecum to indicate an appendicitis. Lymphatic: No adenopathy Reproductive: Hysterectomy Other: Fat containing umbilical hernia. Musculoskeletal: Re- demonstration of T12 compression fracture with near vertebra plana configuration. Retropulsion of the fracture fragments at the posterior endplate is unchanged from comparison studies. Narrowing of the canal measuring 10 mm, similar to comparison studies. Multilevel degenerative changes throughout the lumbar spine with vacuum disc phenomenon spanning L3-S1. IMPRESSION: No acute vascular abnormality. Mild atherosclerotic changes of the abdominal aorta, as well as the mesenteric vessels. Although there is mixed plaque at the origin of the superior mesenteric artery, there does not appear to be greater than 50% stenosis. Celiac artery and IMA remain patent. Aortic Atherosclerosis (ICD10-I70.0). Significant right renal artery atherosclerotic changes, likely with high-grade stenosis which results in asymmetric decrease in the right kidney parenchyma compared to the left. Multiple regions of right renal cortical thinning, either from prior episodes of infarction or alternatively prior  episodes of infection. Liver steatosis. Colonic diverticular disease. Signed, Dulcy Fanny. Earleen Newport, DO [facility] Radiology Electronically Signed   By: Corrie Mckusick D.O.   On: 08/24/2017 17:10    Labs:  CBC: Recent Labs    10/20/16 1346 10/22/16 1227 02/02/17 1436 05/05/17 1027  WBC 17.5* 14.5* 9.6 9.6  HGB 13.2 14.3 13.6 13.2  HCT 37.2 41.8 40.2 39.3  PLT 253 292.0 263.0 265.0    COAGS: No results for input(s): INR, APTT in the last 8760 hours.  BMP: Recent Labs    10/20/16 1346 02/02/17 1436 02/17/17 1508 08/11/17 1229  NA 137 138 136 137  K 3.4* 3.1* 3.8 3.7  CL 104  99 100* 99  CO2 22 27 26 27   GLUCOSE 164* 119* 99 174*  BUN 16 18 16 17   CALCIUM 8.9 9.5 9.4 9.9  CREATININE 1.23* 1.12 1.13* 1.11*  GFRNONAA 43*  --  48* 50*  GFRAA 50*  --  55* 58*    LIVER FUNCTION TESTS: No results for input(s): BILITOT, AST, ALT, ALKPHOS, PROT, ALBUMIN in the last 8760 hours.  TUMOR MARKERS: No results for input(s): AFPTM, CEA, CA199, CHROMGRNA in the last 8760 hours.  Assessment:  Chronic generalized abdominal pain without weight loss or food fear.    Electronically Signed: Murrell Redden PA-C 09/22/2017, 11:51 AM   Please refer to Dr. Pasty Arch attestation of this note for management and plan.

## 2017-09-22 NOTE — Progress Notes (Signed)
Appreciate IR seeing patient. No concern for chronic mesenteric ischemia. We need to have patient return non-urgently for follow-up. Please arrange a follow-up here.

## 2017-09-22 NOTE — Progress Notes (Signed)
PATIENT SCHEDULED  °

## 2017-09-28 IMAGING — US US THYROID BIOPSY
1 series · 13 of 15 positions shown · non-contrast
Comparison: Thyroid ultrasound - 11/11/2015;

INDICATION: History of right-sided thyroidectomy for benign pathology. Thyroid
ultrasound performed 11/11/2015 demonstrated an indeterminate
approximately 1.8 cm nodule / pseudo nodule with the mid / anterior
aspect of the left lobe of the thyroid. For this, the patient
underwent a technically successful ultrasound-guided fine-needle
aspiration on 11/26/2015, however pathology demonstrated atypia of
uncertain significance.

As such, the patient returns for repeat ultrasound-guided
fine-needle aspiration of this nodule with additional Afirma
testing.
EXAM:
ULTRASOUND GUIDED NEEDLE ASPIRATE BIOPSY OF THE THYROID GLAND

[Series 1: us thyroid biopsy · 0.07mm/px · 15 acquisitions, 13 frames shown]
[im 1/15]
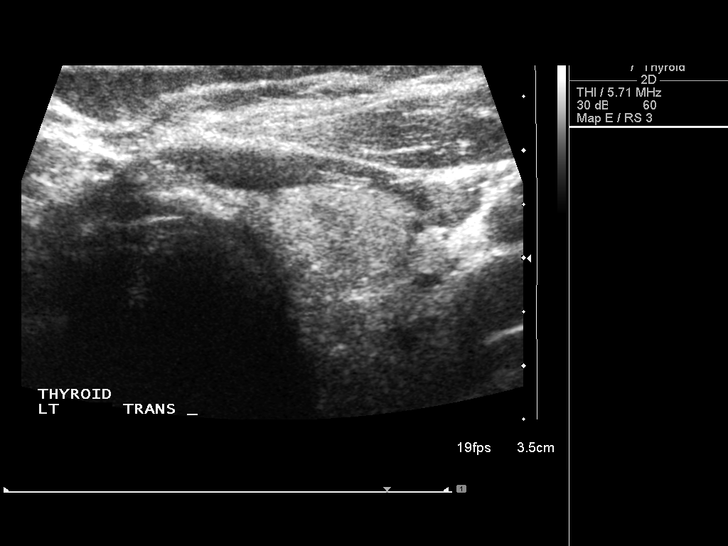
[im 2/15]
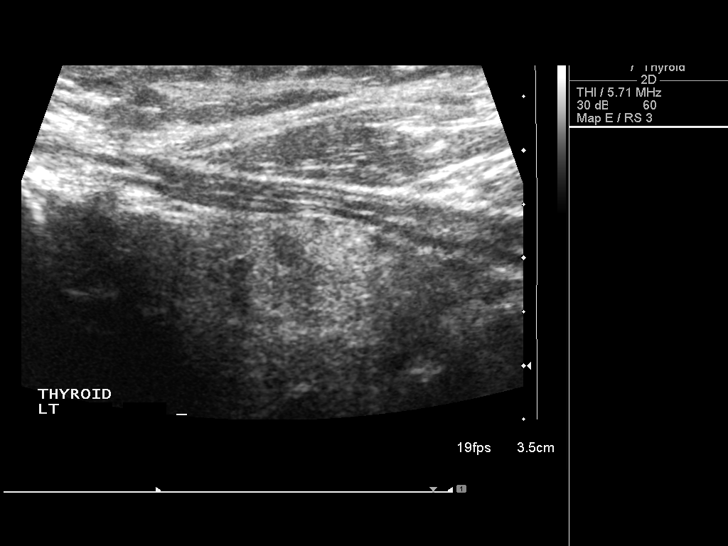
[im 3/15]
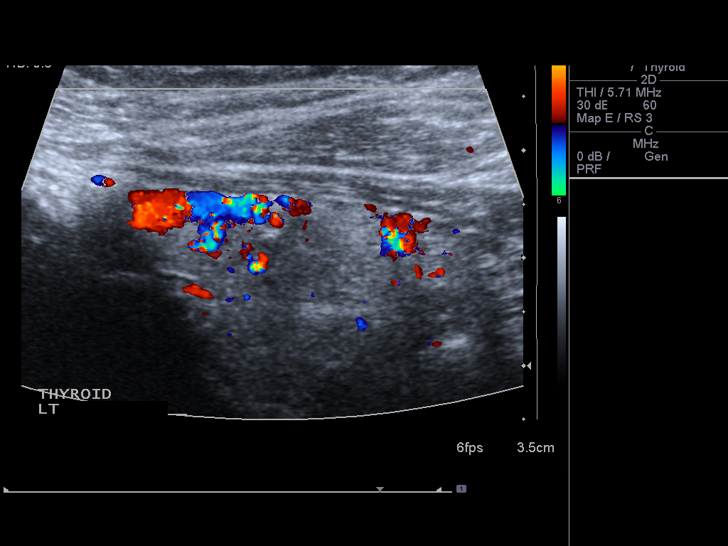
[im 5/15]
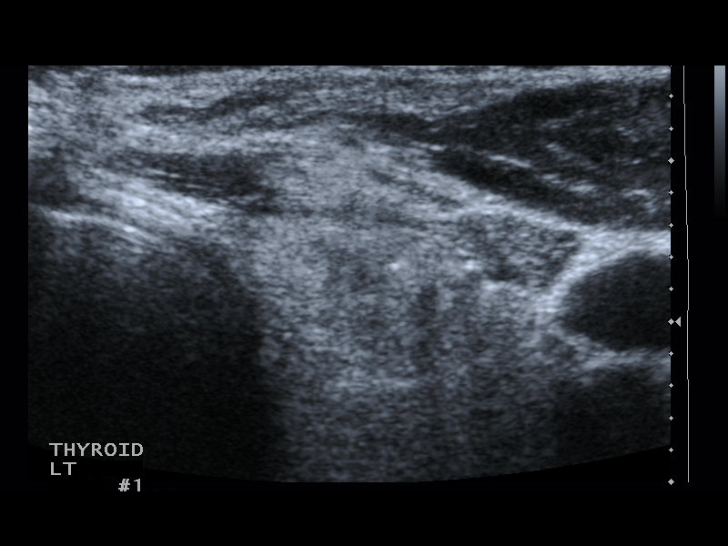
[im 6/15]
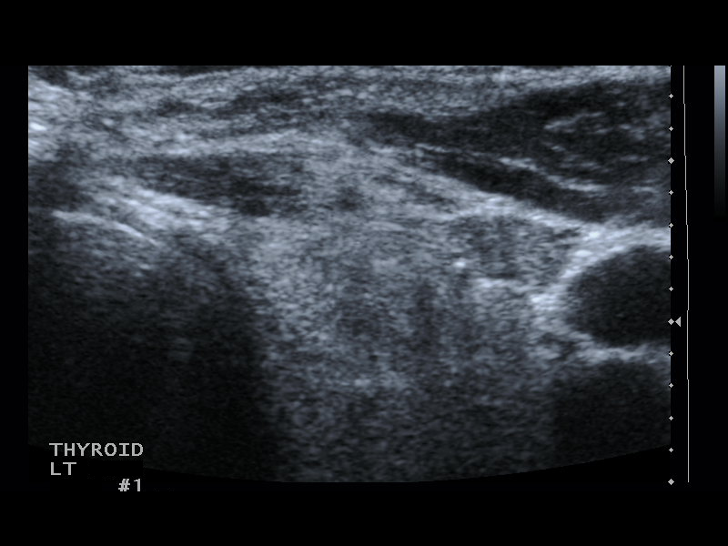
[im 7/15]
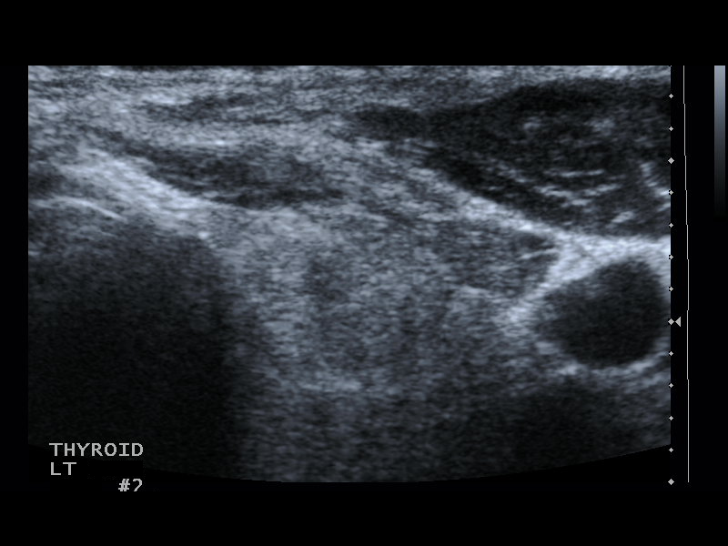
[im 8/15]
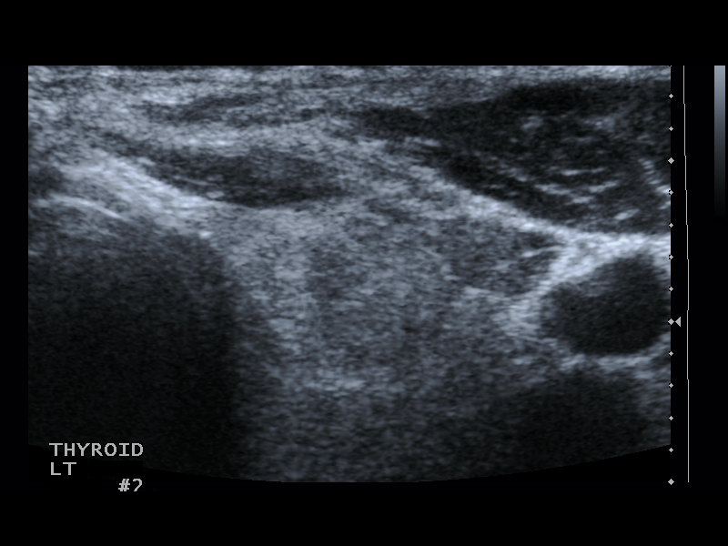
[im 9/15]
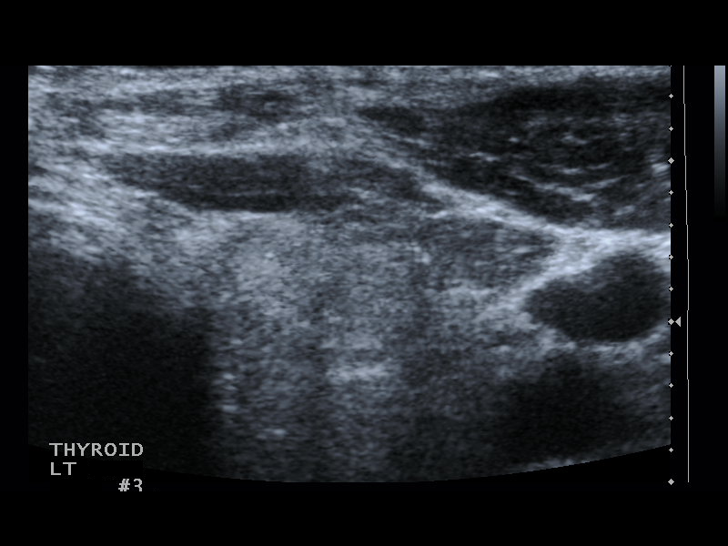
[im 10/15]
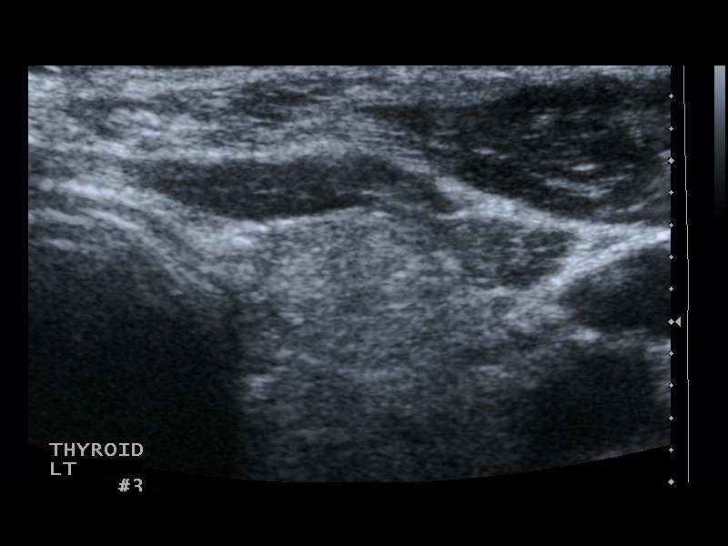
[im 11/15]
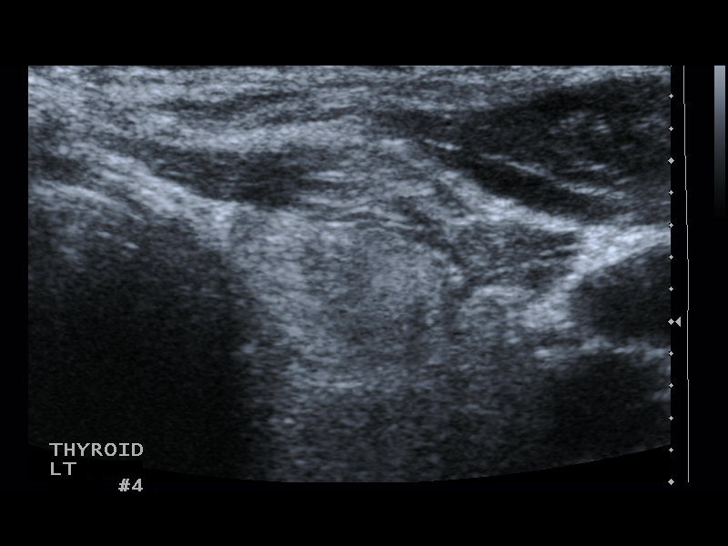
[im 13/15]
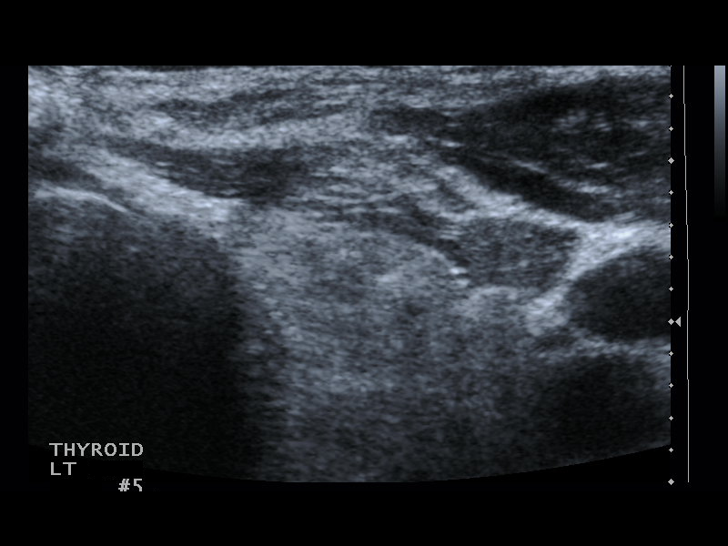
[im 14/15]
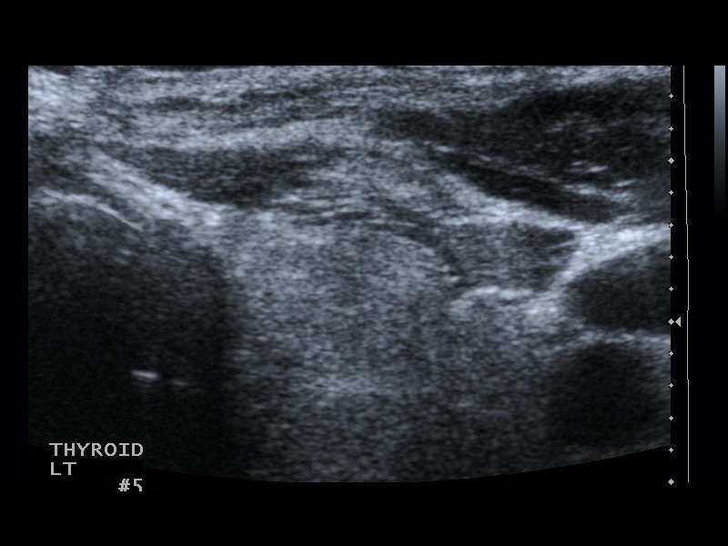
[im 15/15]
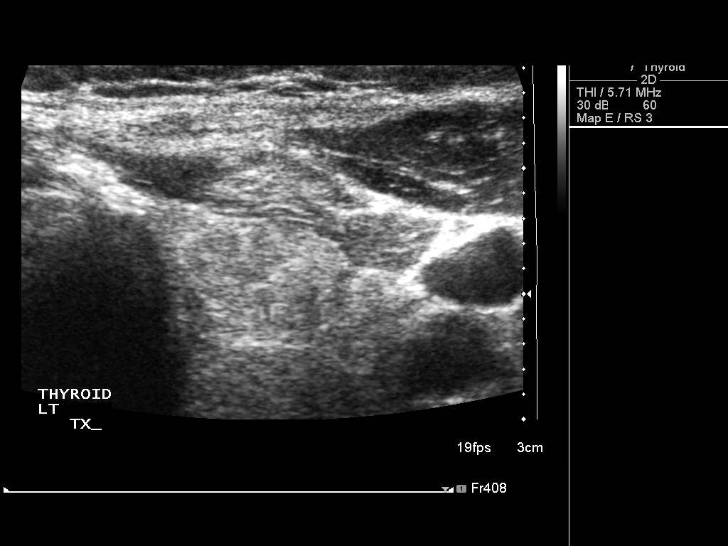

[13 of 15 positions shown; findings below may reference images not displayed]

ultrasound-guided fine
needle aspiration of dominant left-sided thyroid nodule -11/26/2015

MEDICATIONS:
1% lidocaine

COMPLICATIONS:
None immediate.

PROCEDURE:
Informed written consent was obtained from the patient after a
thorough discussion of the procedural risks, benefits and
alternatives. All questions were addressed. Maximal Sterile Barrier
Technique was utilized including caps, mask, sterile gowns, sterile
gloves, sterile drape, hand hygiene and skin antiseptic. A timeout
was performed prior to the initiation of the procedure.

Ultrasound was performed to localize and mark an adequate site for
the biopsy. The patient was then prepped and draped in a normal
sterile fashion. Local anesthesia was provided with 1% lidocaine.
Using direct ultrasound guidance, 5 passes were made using needles
into the nodule within the mid / anterior aspect of the lobe of the
thyroid. Note, the final 2 samples were placed in separate solution
for potential Afirma testing. Ultrasound was used to confirm needle
placements on all occasions. Specimens were sent to Pathology for
analysis.
IMPRESSION: Ultrasound guided needle aspirate biopsy performed of dominant
indeterminate approximately 1.8 cm nodule within the mid/anterior
aspect the left lobe of the thyroid.

## 2017-10-03 ENCOUNTER — Telehealth: Payer: Self-pay | Admitting: Internal Medicine

## 2017-10-03 NOTE — Telephone Encounter (Signed)
[phone] PLEASE CALL PATIENT, HAVING TROUBLE WITH FECAL LEAKAGE.  CAN NOT CONTROL IT.  THINKS PEOPLE CAN SMELL HER

## 2017-10-03 NOTE — Telephone Encounter (Signed)
Add fiber to diet. Use preparation H OTC as needed. Arrange non-urgent follow-up here.

## 2017-10-03 NOTE — Telephone Encounter (Signed)
Pt notified and will f/u 11/03/17 at her next appt.

## 2017-10-03 NOTE — Telephone Encounter (Signed)
Spoke wit pt. Pt is embarrassed due to feeling people can smell feces on her. Pt said when she has gone to the bathroom to urinate, she wipes her rectum and it looks like pt had a bowel movement and she hasn't. Pt reports some tenderness in rectum. When asked does pt have any hemorrhoids bother her, pt said she doesn't have any hemorrhoids. Pt reports no diarrhea, no constipation due to taking Miralax once daily. Pt reports no recent medication changer or procedures. Is there anything else pt can do besides using a wet cloth after bowel movements?

## 2017-10-07 ENCOUNTER — Other Ambulatory Visit: Payer: Self-pay | Admitting: Gastroenterology

## 2017-10-20 ENCOUNTER — Telehealth: Payer: Self-pay | Admitting: Internal Medicine

## 2017-10-20 NOTE — Telephone Encounter (Signed)
Pt called this afternoon asking for AB. I told her AB was not here today and I could take a message. She said that she is still having problems with her bowels. She said her stool goes back up when she wipes and it smells. Please call [phone]

## 2017-10-20 NOTE — Telephone Encounter (Signed)
AB pt has an appointment 11/03/17. Spoke with pt and I told her to keep her 11/03/17 apt for evaluation. Pt isn't having any diarrhea. Pt reports she wears a diaper nonstop because when she uses the bathroom, pt feels there is always some bowel movement left behind. Pt is concerned with order coming with it. Pt is concerned that she has an infection.

## 2017-10-21 NOTE — Telephone Encounter (Signed)
Would add fiber to diet. If no diarrhea, no stool studies needed. Keep appt upcoming.

## 2017-10-21 NOTE — Telephone Encounter (Signed)
Pt notified and will keep apt. Pt isn't having Diarrhea.

## 2017-10-24 ENCOUNTER — Other Ambulatory Visit: Payer: Self-pay

## 2017-10-24 ENCOUNTER — Telehealth: Payer: Self-pay | Admitting: Internal Medicine

## 2017-10-24 DIAGNOSIS — K921 Melena: Secondary | ICD-10-CM

## 2017-10-24 NOTE — Telephone Encounter (Signed)
Is this pitch black, "sticky", tar-like stool? Is she taking iron? She has a history of chronic abdominal pain. If she feels light-headed, worsening abdominal pain, fatigue, would seek evaluation. Otherwise, we can order a stat CBC. If there is an urgent this week, please put her in there.

## 2017-10-24 NOTE — Telephone Encounter (Signed)
Spoke with pt, pt isn't taking iron supplements. pts stool is dark black and looks like tar per pt. Pt didn't want to have lab work. Spoke with AB and pt can be seen tomorrow but will need CBC done before coming to appt. Pt agreed to have lab work and will have labs done in the morning prior to appt. Pt was scheduled for 10/25/17 @ 2PM

## 2017-10-24 NOTE — Telephone Encounter (Signed)
Spoke with pt and pt started having black stool last night. Pt has had five bowel movements today and stool has been black each time. No blood seen in the toilet or when wiping after bowel movements. Pt does have some abdomen pain in her abdomen from time to time pt reports. See previous note from last week. pts next apt is 11/03/17

## 2017-10-24 NOTE — Telephone Encounter (Signed)
[phone] please call patient, wants to speak to Michelle Zavala about her bowels being black

## 2017-10-25 ENCOUNTER — Ambulatory Visit (INDEPENDENT_AMBULATORY_CARE_PROVIDER_SITE_OTHER): Payer: Medicare Other | Admitting: Gastroenterology

## 2017-10-25 ENCOUNTER — Encounter: Payer: Self-pay | Admitting: Gastroenterology

## 2017-10-25 VITALS — BP 133/92 | HR 80 | Temp 97.1°F | Ht 65.0 in | Wt 208.0 lb

## 2017-10-25 DIAGNOSIS — R1010 Upper abdominal pain, unspecified: Secondary | ICD-10-CM

## 2017-10-25 LAB — CBC WITH DIFFERENTIAL/PLATELET
Basophils Absolute: 65 cells/uL (ref 0–200)
Basophils Relative: 0.6 %
Eosinophils Absolute: 65 cells/uL (ref 15–500)
Eosinophils Relative: 0.6 %
HCT: 38.5 % (ref 35.0–45.0)
Hemoglobin: 13.4 g/dL (ref 11.7–15.5)
Lymphs Abs: 3359 cells/uL (ref 850–3900)
MCH: 30 pg (ref 27.0–33.0)
MCHC: 34.8 g/dL (ref 32.0–36.0)
MCV: 86.3 fL (ref 80.0–100.0)
MPV: 10 fL (ref 7.5–12.5)
Monocytes Relative: 9.6 %
Neutro Abs: 6275 cells/uL (ref 1500–7800)
Neutrophils Relative %: 58.1 %
Platelets: 261 10*3/uL (ref 140–400)
RBC: 4.46 10*6/uL (ref 3.80–5.10)
RDW: 13.4 % (ref 11.0–15.0)
Total Lymphocyte: 31.1 %
WBC mixed population: 1037 cells/uL — ABNORMAL HIGH (ref 200–950)
WBC: 10.8 10*3/uL (ref 3.8–10.8)

## 2017-10-25 NOTE — Patient Instructions (Signed)
Please have blood work done today if you haven't already.  Continue the benefiber daily. Add Miralax every other day to daily as needed.  I will see you in 3 months!

## 2017-10-25 NOTE — Progress Notes (Signed)
Referring Provider: Octavio Graves, DO Primary Care Physician:  Octavio Graves, DO  Chief Complaint  Patient presents with  . Melena    started Sunday night   . Abdominal Pain    HPI:   Michelle Zavala is a 72 y.o. female presenting today with a history of chronic abdominal pain, diarrhea, dysphagia. 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 Progression of SMA disease could cause issues, but clinical monitoring recommended per IR. She was seen by Vascular Surgery due to renal artery disease as well. Medical therapy recommended. Symptomatic fat-containing ventral hernia noted at visit in March 2017, undergoing hernia repair in March 2017. Recently completed TCS/EGD with negative segmental colonic biopsies, needing surveillance in 5 years due to colonic adenomas. EGD with empiric dilatation, small hiatal hernia. Celiac serologies negative. 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 Negative hydrogen breath test for small bowel bacterial overgrowth. CTA updated 08/24/17: mixed plaque at origin of SMA but not felt to be greater than 50%. Celiac and IMA remained patent. Discussed with Dr. Earleen Newport, who recommended IR outpatient clinic evaluation. She was not felt to have chronic mesenteric ischemia.    Pain some improved after BM. Took pepto bismol 2-3 days ago. Saw black stool yesterday. Activia seems to help with stomach discomfort. Usually BM daily. No straining. Had an episode of incontinence after eating meatloaf and salad. Yesterday had small pieces all day long. Likes Miralax. States she just started taking this a few weeks ago. Benefiber helps, too.   Past Medical History:  Diagnosis Date  . Anxiety   . Arthritis   . C2 cervical fracture ([place]) 09/17/13   Traumatic fracture witth minimal displacement  . Chronic lung disease    Fibrosis - Dr. Koleen Nimrod  . COPD (chronic obstructive pulmonary disease) ([place])   . Coronary  atherosclerosis of native coronary artery    Mild atherosclerosis 3/10, LVEF 60-65%  . Depression   . Diverticulosis   . Essential hypertension, benign   . GERD (gastroesophageal reflux disease)   . PSVT (paroxysmal supraventricular tachycardia) ([place])     Past Surgical History:  Procedure Laterality Date  . ABDOMINAL HYSTERECTOMY    . ANTERIOR RELEASE VERTEBRAL BODY W/ POSTERIOR FUSION    . BACTERIAL OVERGROWTH TEST N/A 02/19/2016   Procedure: BACTERIAL OVERGROWTH TEST;  Surgeon: Daneil Dolin, MD;  [facility];  Service: Endoscopy;  Laterality: N/A;  0700  . BIOPSY N/A 08/04/2015   Procedure: BIOPSY;  Surgeon: Daneil Dolin, MD;  [facility];  Service: Endoscopy;  Laterality: N/A;  Gastric  . BIOPSY  02/21/2017   Procedure: BIOPSY;  Surgeon: Daneil Dolin, MD;  [facility];  Service: Endoscopy;;  ascending colon biopsy   . CESAREAN SECTION    . CHOLECYSTECTOMY  1973  . COLONOSCOPY  June 2016   Dr. Britta Mccreedy: moderate diverticulosis in sigmoid colon, surveillance in 5 years   . COLONOSCOPY WITH PROPOFOL N/A 02/21/2017   Dr. Gala Romney: diverticulosis in sigmoid colon, tubular adenomas, segmental biopsies benign. Surveillance in 5 years   . ESOPHAGOGASTRODUODENOSCOPY (EGD) WITH PROPOFOL N/A 08/04/2015   Dr. Rourk:abnormal gastric mucosa s/p biopsy. Reactive gastropathy. Negative H.pylori  . ESOPHAGOGASTRODUODENOSCOPY (EGD) WITH PROPOFOL N/A 02/21/2017   Dr. Gala Romney: empiric esophageal dilatation, small hiatal hernia, otherwise normal  . INCISIONAL HERNIA REPAIR N/A 12/03/2015   Procedure: [name] HERNIORRHAPHY WITH MESH;  Surgeon: Aviva Signs, MD;  [facility];  Service: General;  Laterality: N/A;  . INSERTION OF MESH  12/03/2015   Procedure: INSERTION OF MESH;  Surgeon: Aviva Signs, MD;  [facility];  Service: General;;  . IR RADIOLOGIST EVAL & MGMT  09/22/2017  . MALONEY DILATION N/A 02/21/2017   Procedure: [name] DILATION;  Surgeon: Daneil Dolin,  MD;  [facility];  Service: Endoscopy;  Laterality: N/A;  . POLYPECTOMY  02/21/2017   Procedure: POLYPECTOMY;  Surgeon: Daneil Dolin, MD;  [facility];  Service: Endoscopy;;  hepatic flexure polyp cs  . TMJ ARTHROSCOPY Bilateral 02/04/2015   Procedure: BILATERAL TEMPOROMANDIBULAR JOINT (TMJ) ARTHROSCOPY MENISECTOMY WITH FAT GRAFT FROM ABDOMEN ;  Surgeon: Diona Browner, DDS;  Location: [facility];  Service: Oral Surgery;  Laterality: Bilateral;  . TOTAL KNEE ARTHROPLASTY Bilateral   . TUBAL LIGATION      Current Outpatient Medications  Medication Sig Dispense Refill  . albuterol (PROAIR HFA) 108 (90 Base) MCG/ACT inhaler Inhale 2 puffs into the lungs every 4 (four) hours as needed for wheezing or shortness of breath.    . alfuzosin (UROXATRAL) 10 MG 24 hr tablet Take 10 mg by mouth daily with breakfast.    . amitriptyline (ELAVIL) 75 MG tablet Take 75 mg by mouth at bedtime.  6  . famotidine (PEPCID) 20 MG tablet TAKE ONE TABLET BY MOUTH AT BEDTIME. 30 tablet 0  . fluticasone (FLONASE) 50 MCG/ACT nasal spray Place 2 sprays into both nostrils daily as needed for allergies or rhinitis.    . hydrochlorothiazide (HYDRODIURIL) 25 MG tablet Take 25 mg by mouth daily.    . hyoscyamine (LEVSIN SL) 0.125 MG SL tablet Place 1 tablet (0.125 mg total) under the tongue every 4 (four) hours as needed. 30 tablet 0  . ipratropium-albuterol (DUONEB) 0.5-2.5 (3) MG/3ML SOLN Take 3 mLs by nebulization every 4 (four) hours as needed.    [name] LORazepam (ATIVAN) 2 MG tablet Take by mouth. Takes 2mg  in the morning and 4mg  at night    . meclizine (ANTIVERT) 25 MG tablet Take 25 mg by mouth daily as needed for dizziness.    . metoprolol succinate (TOPROL-XL) 50 MG 24 hr tablet Take 50 mg by mouth 2 (two) times daily.  3  . metoprolol succinate (TOPROL-XL) 50 MG 24 hr tablet TAKE ONE TABLET BY MOUTH TWICE DAILY. 60 tablet 3  . ondansetron (ZOFRAN) 4 MG tablet Take 1 tablet (4 mg total) by mouth every 8  (eight) hours as needed for nausea or vomiting. 30 tablet 1  . pantoprazole (PROTONIX) 40 MG tablet Take 1 tablet (40 mg total) by mouth 2 (two) times daily. 60 tablet 1  . potassium chloride SA (K-DUR,KLOR-CON) 20 MEQ tablet TAKE TWO (2) TABLETS BY MOUTH DAILY 60 tablet 3  . PROCTOZONE-HC 2.5 % rectal cream PLACE RECTALLY TWICE DAILY AS DIRECTED. 30 g 1  . sertraline (ZOLOFT) 100 MG tablet Take 100 mg by mouth daily.     . vitamin B-12 (CYANOCOBALAMIN) 100 MCG tablet Take 100 mcg by mouth daily.    . Vitamin D, Ergocalciferol, (DRISDOL) 50000 units CAPS capsule Take 50,000 Units by mouth every 7 (seven) days. Saturday    . acetaminophen (TYLENOL) 650 MG CR tablet Take 1,300 mg by mouth at bedtime as needed for pain.      No current facility-administered medications for this visit.     Allergies as of 10/25/2017 - Review Complete 10/25/2017  Allergen Reaction Noted  . Sulfonamide derivatives Hives  Family History  Problem Relation Age of Onset  . Coronary artery disease Father        Premature  . Heart disease Father   . Coronary artery disease Brother        Premature  . Coronary artery disease Sister        Premature  . Colon cancer Neg Hx     Social History   Socioeconomic History  . Marital status: Widowed    Spouse name: None  . Number of children: 3  . Years of education: 7  . Highest education level: None  Social Needs  . Financial resource strain: None  . Food insecurity - worry: None  . Food insecurity - inability: None  . Transportation needs - medical: None  . Transportation needs - non-medical: None  Occupational History  . Occupation: Retired  Tobacco Use  . Smoking status: Never Smoker  . Smokeless tobacco: Never Used  Substance and Sexual Activity  . Alcohol use: No    Alcohol/week: 0.0 oz  . Drug use: No  . Sexual activity: No    Birth control/protection: None, Surgical  Other Topics Concern  . None  Social History Narrative   Occasionally  drinks [employer]     Review of Systems: Gen: Denies fever, chills, anorexia. Denies fatigue, weakness, weight loss.  CV: Denies chest pain, palpitations, syncope, peripheral edema, and claudication. Resp: Denies dyspnea at rest, cough, wheezing, coughing up blood, and pleurisy. GI: see HPI  Derm: Denies rash, itching, dry skin Psych: Denies depression, anxiety, memory loss, confusion. No homicidal or suicidal ideation.  Heme: see HPI   Physical Exam: BP (!) 133/92   Pulse 80   Temp (!) 97.1 F (36.2 C) (Oral)   Ht 5\' 5"  (1.651 m)   Wt 208 lb (94.3 kg)   BMI 34.61 kg/m  General:   Alert and oriented. No distress noted. Pleasant and cooperative.  Head:  Normocephalic and atraumatic. Eyes:  Conjuctiva clear without scleral icterus. Mouth:  Oral mucosa pink and moist. Good dentition. No lesions. Abdomen:  +BS, soft, non-tender and non-distended. No rebound or guarding. No HSM or masses noted. Msk:  Symmetrical without gross deformities. Normal posture. Extremities:  Without edema. Neurologic:  Alert and  oriented x4 Psych:  Alert and cooperative. Normal mood and affect.

## 2017-10-28 NOTE — Assessment & Plan Note (Signed)
72 year old female with chronic abdominal pain, thoroughly evaluated as noted in HPI to include imaging, colonoscopy, EGD, Vascular evaluation and negative for chronic mesenteric ischemia. Reporting black stool but this was in the setting of taking pepto bismol. Doubt melena. Check CBC now. Monitor for any recurrent issues.   She seems to be doing well with addition of fiber, taking Activia, and Miralax. Continue Miralax every other day to daily as needed. Return in 3 months.

## 2017-10-31 NOTE — Progress Notes (Signed)
CBC completely normal. Likely black stool was secondary to the medication she took.

## 2017-10-31 NOTE — Progress Notes (Signed)
cc'd to pcp 

## 2017-11-03 ENCOUNTER — Ambulatory Visit: Payer: Medicare Other | Admitting: Gastroenterology

## 2017-11-09 DIAGNOSIS — N183 Chronic kidney disease, stage 3 unspecified: Secondary | ICD-10-CM | POA: Insufficient documentation

## 2017-11-09 DIAGNOSIS — I150 Renovascular hypertension: Secondary | ICD-10-CM | POA: Insufficient documentation

## 2017-11-09 DIAGNOSIS — I7771 Dissection of carotid artery: Secondary | ICD-10-CM | POA: Insufficient documentation

## 2017-12-06 DIAGNOSIS — K9289 Other specified diseases of the digestive system: Secondary | ICD-10-CM | POA: Insufficient documentation

## 2018-01-03 ENCOUNTER — Other Ambulatory Visit: Payer: Self-pay | Admitting: Cardiology

## 2018-01-16 NOTE — Progress Notes (Signed)
Cardiology Office Note  Date: 01/17/2018   ID: CELINES Zavala, DOB 08/08/1946, MRN [id number]  PCP: Octavio Graves, DO  Primary Cardiologist: Rozann Lesches, MD   Chief Complaint  Patient presents with  . PSVT  . Chest Pain    History of Present Illness: Michelle Zavala is a 72 y.o. female last seen in November 2018.  She presents for a routine follow-up visit.  She states that over the last 3 or 4 weeks she has been having episodes of exertional chest discomfort and shortness of breath, left-sided discomfort that is fairly sharp.  Goes away when she stops.  This is despite stable pulmonary regimen, no increased wheezing or cough.  She does have a history of mild coronary artery disease as of assessment in 2010.  Otherwise no prolonged episodes of palpitations to suggest recurrent PSVT.  I reviewed her medications.  We discussed taking a baby aspirin once a day, also continuing beta-blocker therapy.  He has not undergone any recent ischemic testing we will arrange a Lexiscan Myoview to follow-up status of CAD.  Past Medical History:  Diagnosis Date  . Anxiety   . Arthritis   . C2 cervical fracture ([place]) 09/17/13   Traumatic fracture witth minimal displacement  . Chronic lung disease    Fibrosis - Dr. Koleen Nimrod  . COPD (chronic obstructive pulmonary disease) ([place])   . Coronary atherosclerosis of native coronary artery    Mild atherosclerosis 3/10, LVEF 60-65%  . Depression   . Diverticulosis   . Essential hypertension, benign   . GERD (gastroesophageal reflux disease)   . PSVT (paroxysmal supraventricular tachycardia) ([place])     Past Surgical History:  Procedure Laterality Date  . ABDOMINAL HYSTERECTOMY    . ANTERIOR RELEASE VERTEBRAL BODY W/ POSTERIOR FUSION    . BACTERIAL OVERGROWTH TEST N/A 02/19/2016   Procedure: BACTERIAL OVERGROWTH TEST;  Surgeon: Daneil Dolin, MD;  [facility];  Service: Endoscopy;  Laterality: N/A;  0700  . BIOPSY N/A 08/04/2015   Procedure: BIOPSY;  Surgeon: Daneil Dolin, MD;  [facility];  Service: Endoscopy;  Laterality: N/A;  Gastric  . BIOPSY  02/21/2017   Procedure: BIOPSY;  Surgeon: Daneil Dolin, MD;  [facility];  Service: Endoscopy;;  ascending colon biopsy   . CESAREAN SECTION    . CHOLECYSTECTOMY  1973  . COLONOSCOPY  June 2016   Dr. Britta Mccreedy: moderate diverticulosis in sigmoid colon, surveillance in 5 years   . COLONOSCOPY WITH PROPOFOL N/A 02/21/2017   Dr. Gala Romney: diverticulosis in sigmoid colon, tubular adenomas, segmental biopsies benign. Surveillance in 5 years   . ESOPHAGOGASTRODUODENOSCOPY (EGD) WITH PROPOFOL N/A 08/04/2015   Dr. Rourk:abnormal gastric mucosa s/p biopsy. Reactive gastropathy. Negative H.pylori  . ESOPHAGOGASTRODUODENOSCOPY (EGD) WITH PROPOFOL N/A 02/21/2017   Dr. Gala Romney: empiric esophageal dilatation, small hiatal hernia, otherwise normal  . INCISIONAL HERNIA REPAIR N/A 12/03/2015   Procedure: [name] HERNIORRHAPHY WITH MESH;  Surgeon: Aviva Signs, MD;  [facility];  Service: General;  Laterality: N/A;  . INSERTION OF MESH  12/03/2015   Procedure: INSERTION OF MESH;  Surgeon: Aviva Signs, MD;  [facility];  Service: General;;  . IR RADIOLOGIST EVAL & MGMT  09/22/2017  . MALONEY DILATION N/A 02/21/2017   Procedure: [name] DILATION;  Surgeon: Daneil Dolin, MD;  [facility];  Service: Endoscopy;  Laterality: N/A;  . POLYPECTOMY  02/21/2017   Procedure: POLYPECTOMY;  Surgeon: Daneil Dolin, MD;  [facility];  Service: Endoscopy;;  hepatic flexure polyp cs  . TMJ ARTHROSCOPY Bilateral 02/04/2015   Procedure: BILATERAL TEMPOROMANDIBULAR JOINT (TMJ) ARTHROSCOPY MENISECTOMY WITH FAT GRAFT FROM ABDOMEN ;  Surgeon: Diona Browner, DDS;  Location: [facility];  Service: Oral Surgery;  Laterality: Bilateral;  . TOTAL KNEE ARTHROPLASTY Bilateral   . TUBAL LIGATION      Current Outpatient Medications  Medication Sig Dispense Refill  .  acetaminophen (TYLENOL) 650 MG CR tablet Take 1,300 mg by mouth at bedtime as needed for pain.     [name] albuterol (PROAIR HFA) 108 (90 Base) MCG/ACT inhaler Inhale 2 puffs into the lungs every 4 (four) hours as needed for wheezing or shortness of breath.    . alfuzosin (UROXATRAL) 10 MG 24 hr tablet Take 10 mg by mouth daily with breakfast.    . amitriptyline (ELAVIL) 75 MG tablet Take 75 mg by mouth at bedtime.  6  . aspirin EC 81 MG tablet Take 81 mg by mouth daily.    . fluticasone (FLONASE) 50 MCG/ACT nasal spray Place 2 sprays into both nostrils daily as needed for allergies or rhinitis.    [name] ipratropium-albuterol (DUONEB) 0.5-2.5 (3) MG/3ML SOLN Take 3 mLs by nebulization every 4 (four) hours as needed.    [name] LORazepam (ATIVAN) 2 MG tablet Take by mouth. Takes 2mg  in the morning and 4mg  at night    . metoprolol succinate (TOPROL-XL) 50 MG 24 hr tablet Take 50 mg by mouth 2 (two) times daily.  3  . pantoprazole (PROTONIX) 40 MG tablet Take 1 tablet (40 mg total) by mouth 2 (two) times daily. 60 tablet 1  . sertraline (ZOLOFT) 100 MG tablet Take 100 mg by mouth daily.     . vitamin B-12 (CYANOCOBALAMIN) 100 MCG tablet Take 100 mcg by mouth daily.    . Vitamin D, Ergocalciferol, (DRISDOL) 50000 units CAPS capsule Take 50,000 Units by mouth every 7 (seven) days. Saturday     No current facility-administered medications for this visit.    Allergies:  Sulfonamide derivatives   Social History: The patient  reports that she has never smoked. She has never used smokeless tobacco. She reports that she does not drink alcohol or use drugs.   ROS:  Please see the history of present illness. Otherwise, complete review of systems is positive for emotional stress with recent deaths in the family.  All other systems are reviewed and negative.    Physical Exam: VS:  BP 117/80   Pulse 100   Ht 5\' 5"  (1.651 m)   Wt 213 lb 6.4 oz (96.8 kg)   SpO2 96%   BMI 35.51 kg/m , BMI Body mass index is 35.51  kg/m.  Wt Readings from Last 3 Encounters:  01/17/18 213 lb 6.4 oz (96.8 kg)  10/25/17 208 lb (94.3 kg)  09/22/17 216 lb (98 kg)    General: Patient appears comfortable at rest. HEENT: Conjunctiva and lids normal, oropharynx clear. Neck: Supple, no elevated JVP or carotid bruits, no thyromegaly. Lungs: No wheezing, nonlabored breathing at rest. Cardiac: Regular rate and rhythm, no S3 or significant systolic murmur. Abdomen: Soft, nontender, bowel sounds present. Extremities: No pitting edema, distal pulses 2+. Skin: Warm and dry. Musculoskeletal: No kyphosis. Neuropsychiatric: Alert and oriented x3, affect grossly appropriate.  ECG: I personally reviewed the tracing from 08/01/2017 which showed sinus rhythm with leftward axis and poor R wave progression.  Recent Labwork: 02/02/2017: Pro B Natriuretic peptide (BNP) 71.0; TSH 1.54 08/11/2017: BUN 17; Creat  1.11; Potassium 3.7; Sodium 137 10/25/2017: Hemoglobin 13.4; Platelets 261   Other Studies Reviewed Today:  Carotid Dopplers 06/02/2017: Stable 1-39% bilateral ICA stenoses.  Echocardiogram 08/11/2016: Study Conclusions  - Left ventricle: The cavity size was normal. Wall thickness was increased in a pattern of mild LVH. Systolic function was normal. The estimated ejection fraction was in the range of 55% to 60%. Doppler parameters are consistent with abnormal left ventricular relaxation (grade 1 diastolic dysfunction). - Aortic valve: There was mild regurgitation. Valve area (VTI): 2.42 cm^2. Valve area (Vmax): 2.22 cm^2. Valve area (Vmean): 2.29 cm^2. - Technically difficult study.  Assessment and Plan:  1.  Exertional chest discomfort and shortness of breath.  She does have chronic lung disease which could be contributing, but reports no wheezing or cough.  Also history of mild CAD by assessment in 2010.  We will obtain a [place] for follow-up ischemic assessment.  Continue aspirin and  beta-blocker.  2.  History of PSVT, no obvious recurrent episodes on beta-blocker.  3.  Essential hypertension, blood pressure well controlled today.  Keep follow-up with Dr. Melina Copa.  4.  COPD, followed by Dr. Melvyn Novas.  Current medicines were reviewed with the patient today.   Orders Placed This Encounter  Procedures  . NM Myocar Multi W/Spect W/Wall Motion / EF    Disposition: Call with test results.  Signed, Satira Sark, MD, [facility] 01/17/2018 11:31 AM    [facility] [place], [place], [place] [address] Phone: [phone]; Fax: [phone]

## 2018-01-17 ENCOUNTER — Telehealth: Payer: Self-pay | Admitting: Cardiology

## 2018-01-17 ENCOUNTER — Ambulatory Visit (INDEPENDENT_AMBULATORY_CARE_PROVIDER_SITE_OTHER): Payer: Medicare Other | Admitting: Cardiology

## 2018-01-17 ENCOUNTER — Encounter: Payer: Self-pay | Admitting: Cardiology

## 2018-01-17 VITALS — BP 117/80 | HR 100 | Ht 65.0 in | Wt 213.4 lb

## 2018-01-17 DIAGNOSIS — I471 Supraventricular tachycardia: Secondary | ICD-10-CM | POA: Diagnosis not present

## 2018-01-17 DIAGNOSIS — R079 Chest pain, unspecified: Secondary | ICD-10-CM | POA: Diagnosis not present

## 2018-01-17 DIAGNOSIS — I25119 Atherosclerotic heart disease of native coronary artery with unspecified angina pectoris: Secondary | ICD-10-CM | POA: Diagnosis not present

## 2018-01-17 DIAGNOSIS — J449 Chronic obstructive pulmonary disease, unspecified: Secondary | ICD-10-CM

## 2018-01-17 DIAGNOSIS — I10 Essential (primary) hypertension: Secondary | ICD-10-CM

## 2018-01-17 NOTE — Telephone Encounter (Signed)
Pre-cert Verification for the following procedure   Lexiscan scheduled for 01/26/18 at [facility]

## 2018-01-17 NOTE — Patient Instructions (Signed)
Medication Instructions:  Your physician has recommended you make the following change in your medication:    START Aspirin 81 mg daily   Please continue all medications as prescribed  Labwork: NONE  Testing/Procedures: Your physician has requested that you have a lexiscan myoview. For further information please visit [email or web address]. Please follow instruction sheet, as given.  Follow-Up: Your physician wants you to follow-up in: 6 MONTHS WITH DR. Domenic Polite You will receive a reminder letter in the mail two months in advance. If you don't receive a letter, please call our office to schedule the follow-up appointment.  Any Other Special Instructions Will Be Listed Below (If Applicable).  If you need a refill on your cardiac medications before your next appointment, please call your pharmacy.

## 2018-01-19 ENCOUNTER — Ambulatory Visit (INDEPENDENT_AMBULATORY_CARE_PROVIDER_SITE_OTHER): Payer: Medicare Other | Admitting: Orthopaedic Surgery

## 2018-01-24 ENCOUNTER — Ambulatory Visit: Payer: Medicare Other | Admitting: Gastroenterology

## 2018-01-26 ENCOUNTER — Encounter (HOSPITAL_COMMUNITY): Payer: Self-pay

## 2018-01-26 ENCOUNTER — Encounter (HOSPITAL_BASED_OUTPATIENT_CLINIC_OR_DEPARTMENT_OTHER)
Admission: RE | Admit: 2018-01-26 | Discharge: 2018-01-26 | Disposition: A | Discharge status: Home / Self Care | Payer: Medicare Other | Source: Ambulatory Visit | Attending: Cardiology | Admitting: Cardiology

## 2018-01-26 ENCOUNTER — Encounter (HOSPITAL_COMMUNITY)
Admission: RE | Admit: 2018-01-26 | Discharge: 2018-01-26 | Disposition: A | Discharge status: Home / Self Care | Payer: Medicare Other | Source: Ambulatory Visit | Attending: Cardiology | Admitting: Cardiology

## 2018-01-26 DIAGNOSIS — R079 Chest pain, unspecified: Secondary | ICD-10-CM | POA: Diagnosis not present

## 2018-01-26 LAB — NM MYOCAR MULTI W/SPECT W/WALL MOTION / EF
LV dias vol: 74 mL (ref 46–106)
LV sys vol: 32 mL
Peak HR: 80 {beats}/min
RATE: 0.32
Rest HR: 79 {beats}/min
SDS: 3
SRS: 0
SSS: 3
TID: 0.98

## 2018-01-26 MED ORDER — TECHNETIUM TC 99M TETROFOSMIN IV KIT
30.0000 | PACK | Freq: Once | INTRAVENOUS | Status: AC | PRN
  Administered 2018-01-26: 30 via INTRAVENOUS

## 2018-01-26 MED ORDER — REGADENOSON 0.4 MG/5ML IV SOLN
INTRAVENOUS | Status: AC
  Administered 2018-01-26: 0.4 mg via INTRAVENOUS
  Filled 2018-01-26: qty 5

## 2018-01-26 MED ORDER — SODIUM CHLORIDE 0.9% FLUSH
INTRAVENOUS | Status: AC
  Administered 2018-01-26: 10 mL via INTRAVENOUS
  Filled 2018-01-26: qty 10

## 2018-01-26 MED ORDER — TECHNETIUM TC 99M TETROFOSMIN IV KIT
10.0000 | PACK | Freq: Once | INTRAVENOUS | Status: AC | PRN
  Administered 2018-01-26: 11 via INTRAVENOUS

## 2018-01-30 ENCOUNTER — Telehealth: Payer: Self-pay | Admitting: *Deleted

## 2018-01-30 NOTE — Telephone Encounter (Signed)
-----   Message from Satira Sark, MD sent at 01/29/2018  2:23 PM EDT ----- Results reviewed. Low risk stress test, no ischemia to suggest development of obstructive CAD as cause of chest pain. Continue with current follow-up plan. A copy of this test should be forwarded to Octavio Graves, DO.

## 2018-01-30 NOTE — Telephone Encounter (Signed)
Patient informed and copy sent to PCP. 

## 2018-01-30 NOTE — Telephone Encounter (Signed)
Returning call for results  °

## 2018-05-06 ENCOUNTER — Other Ambulatory Visit: Payer: Self-pay | Admitting: Cardiology

## 2018-05-17 ENCOUNTER — Ambulatory Visit: Payer: Medicare Other | Admitting: Nurse Practitioner

## 2018-06-07 ENCOUNTER — Telehealth: Payer: Self-pay | Admitting: Internal Medicine

## 2018-06-07 NOTE — Telephone Encounter (Signed)
Pt was asking if AB would call something in for nausea and diarrhea to her pharmacy. She said [facility] in [place].

## 2018-06-08 NOTE — Telephone Encounter (Signed)
Spoke with pt. She is at the hospital and will call back if needed.

## 2018-06-28 ENCOUNTER — Other Ambulatory Visit: Payer: Self-pay | Admitting: Internal Medicine

## 2018-07-27 NOTE — Progress Notes (Deleted)
Cardiology Office Note  Date: 07/27/2018   ID: Michelle Zavala, DOB [birth year], MRN [id number]  PCP: Octavio Graves, DO  Primary Cardiologist: Rozann Lesches, MD   No chief complaint on file.   History of Present Illness: Michelle Zavala is a 72 y.o. female last seen in May.  Follow-up Myoview from May of this year was low risk as outlined below.  Past Medical History:  Diagnosis Date  . Anxiety   . Arthritis   . C2 cervical fracture ([place]) 09/17/13   Traumatic fracture witth minimal displacement  . Chronic lung disease    Fibrosis - Dr. Koleen Nimrod  . COPD (chronic obstructive pulmonary disease) ([place])   . Coronary atherosclerosis of native coronary artery    Mild atherosclerosis 3/10, LVEF 60-65%  . Depression   . Diverticulosis   . Essential hypertension, benign   . GERD (gastroesophageal reflux disease)   . PSVT (paroxysmal supraventricular tachycardia) ([place])     Past Surgical History:  Procedure Laterality Date  . ABDOMINAL HYSTERECTOMY    . ANTERIOR RELEASE VERTEBRAL BODY W/ POSTERIOR FUSION    . BACTERIAL OVERGROWTH TEST N/A 02/19/2016   Procedure: BACTERIAL OVERGROWTH TEST;  Surgeon: Daneil Dolin, MD;  [facility];  Service: Endoscopy;  Laterality: N/A;  0700  . BIOPSY N/A 08/04/2015   Procedure: BIOPSY;  Surgeon: Daneil Dolin, MD;  [facility]: [facility];  Service: Endoscopy;  Laterality: N/A;  Gastric  . BIOPSY  02/21/2017   Procedure: BIOPSY;  Surgeon: Daneil Dolin, MD;  [facility];  Service: Endoscopy;;  ascending colon biopsy   . CESAREAN SECTION    . CHOLECYSTECTOMY  1973  . COLONOSCOPY  June 2016   Dr. Britta Mccreedy: moderate diverticulosis in sigmoid colon, surveillance in 5 years   . COLONOSCOPY WITH PROPOFOL N/A 02/21/2017   Dr. Gala Romney: diverticulosis in sigmoid colon, tubular adenomas, segmental biopsies benign. Surveillance in 5 years   . ESOPHAGOGASTRODUODENOSCOPY (EGD) WITH PROPOFOL N/A 08/04/2015   Dr. Rourk:abnormal gastric mucosa  s/p biopsy. Reactive gastropathy. Negative H.pylori  . ESOPHAGOGASTRODUODENOSCOPY (EGD) WITH PROPOFOL N/A 02/21/2017   Dr. Gala Romney: empiric esophageal dilatation, small hiatal hernia, otherwise normal  . INCISIONAL HERNIA REPAIR N/A 12/03/2015   Procedure: [name] HERNIORRHAPHY WITH MESH;  Surgeon: Aviva Signs, MD;  [facility];  Service: General;  Laterality: N/A;  . INSERTION OF MESH  12/03/2015   Procedure: INSERTION OF MESH;  Surgeon: Aviva Signs, MD;  [facility];  Service: General;;  . IR RADIOLOGIST EVAL & MGMT  09/22/2017  . MALONEY DILATION N/A 02/21/2017   Procedure: [name] DILATION;  Surgeon: Daneil Dolin, MD;  [facility];  Service: Endoscopy;  Laterality: N/A;  . POLYPECTOMY  02/21/2017   Procedure: POLYPECTOMY;  Surgeon: Daneil Dolin, MD;  [facility];  Service: Endoscopy;;  hepatic flexure polyp cs  . TMJ ARTHROSCOPY Bilateral 02/04/2015   Procedure: BILATERAL TEMPOROMANDIBULAR JOINT (TMJ) ARTHROSCOPY MENISECTOMY WITH FAT GRAFT FROM ABDOMEN ;  Surgeon: Diona Browner, DDS;  Location: [facility];  Service: Oral Surgery;  Laterality: Bilateral;  . TOTAL KNEE ARTHROPLASTY Bilateral   . TUBAL LIGATION      Current Outpatient Medications  Medication Sig Dispense Refill  . acetaminophen (TYLENOL) 650 MG CR tablet Take 1,300 mg by mouth at bedtime as needed for pain.     [name] albuterol (PROAIR HFA) 108 (90 Base) MCG/ACT inhaler Inhale 2 puffs into the lungs every 4 (four) hours as needed for wheezing  or shortness of breath.    . alfuzosin (UROXATRAL) 10 MG 24 hr tablet Take 10 mg by mouth daily with breakfast.    . amitriptyline (ELAVIL) 75 MG tablet Take 75 mg by mouth at bedtime.  6  . aspirin EC 81 MG tablet Take 81 mg by mouth daily.    . fluticasone (FLONASE) 50 MCG/ACT nasal spray Place 2 sprays into both nostrils daily as needed for allergies or rhinitis.    [name] ipratropium-albuterol (DUONEB) 0.5-2.5 (3) MG/3ML SOLN Take 3 mLs by nebulization every 4  (four) hours as needed.    [name] LORazepam (ATIVAN) 2 MG tablet Take by mouth. Takes 2mg  in the morning and 4mg  at night    . metoprolol succinate (TOPROL-XL) 50 MG 24 hr tablet Take 50 mg by mouth 2 (two) times daily.  3  . metoprolol succinate (TOPROL-XL) 50 MG 24 hr tablet TAKE ONE TABLET BY MOUTH TWICE DAILY. 180 tablet 1  . pantoprazole (PROTONIX) 40 MG tablet Take 1 tablet (40 mg total) by mouth 2 (two) times daily. 60 tablet 1  . sertraline (ZOLOFT) 100 MG tablet Take 100 mg by mouth daily.     . vitamin B-12 (CYANOCOBALAMIN) 100 MCG tablet Take 100 mcg by mouth daily.    . Vitamin D, Ergocalciferol, (DRISDOL) 50000 units CAPS capsule Take 50,000 Units by mouth every 7 (seven) days. Saturday     No current facility-administered medications for this visit.    Allergies:  Sulfonamide derivatives   Social History: The patient  reports that she has never smoked. She has never used smokeless tobacco. She reports that she does not drink alcohol or use drugs.   Family History: The patient's family history includes Coronary artery disease in her brother, father, and sister; Heart disease in her father.   ROS:  Please see the history of present illness. Otherwise, complete review of systems is positive for {NONE DEFAULTED:18576::"none"}.  All other systems are reviewed and negative.   Physical Exam: VS:  There were no vitals taken for this visit., BMI There is no height or weight on file to calculate BMI.  Wt Readings from Last 3 Encounters:  01/17/18 213 lb 6.4 oz (96.8 kg)  10/25/17 208 lb (94.3 kg)  09/22/17 216 lb (98 kg)    General: Patient appears comfortable at rest. HEENT: Conjunctiva and lids normal, oropharynx clear with moist mucosa. Neck: Supple, no elevated JVP or carotid bruits, no thyromegaly. Lungs: Clear to auscultation, nonlabored breathing at rest. Cardiac: Regular rate and rhythm, no S3 or significant systolic murmur, no pericardial rub. Abdomen: Soft, nontender, no  hepatomegaly, bowel sounds present, no guarding or rebound. Extremities: No pitting edema, distal pulses 2+. Skin: Warm and dry. Musculoskeletal: No kyphosis. Neuropsychiatric: Alert and oriented x3, affect grossly appropriate.  ECG: I personally reviewed the tracing from 08/01/2017 which showed sinus rhythm with leftward axis and poor R wave progression.  Recent Labwork: 08/11/2017: BUN 17; Creat 1.11; Potassium 3.7; Sodium 137 10/25/2017: Hemoglobin 13.4; Platelets 261   Other Studies Reviewed Today:  Carotid Dopplers 06/02/2017: Stable 1-39% bilateral ICA stenoses.  Echocardiogram11/29/2017: Study Conclusions  - Left ventricle: The cavity size was normal. Wall thickness was increased in a pattern of mild LVH. Systolic function was normal. The estimated ejection fraction was in the range of 55% to 60%. Doppler parameters are consistent with abnormal left ventricular relaxation (grade 1 diastolic dysfunction). - Aortic valve: There was mild regurgitation. Valve area (VTI): 2.42 cm^2. Valve area (Vmax): 2.22 cm^2. Valve area (  Vmean): 2.29 cm^2. - Technically difficult study.  Lexiscan Myoview 01/26/2018:  There was no ST segment deviation noted during stress.  The study is normal. There are no perfusion defects  This is a low risk study.  The left ventricular ejection fraction is normal (55-65%).  Assessment and Plan:    Current medicines were reviewed with the patient today.  No orders of the defined types were placed in this encounter.   Disposition:  Signed, Satira Sark, MD, [facility] 07/27/2018 8:38 AM    [facility] [place], [place], [place] [address] Phone: [phone]; Fax: [phone]

## 2018-07-28 ENCOUNTER — Ambulatory Visit: Payer: Medicare Other | Admitting: Cardiology

## 2018-08-01 ENCOUNTER — Encounter: Payer: Self-pay | Admitting: Cardiology

## 2018-08-23 ENCOUNTER — Ambulatory Visit (INDEPENDENT_AMBULATORY_CARE_PROVIDER_SITE_OTHER): Payer: Medicare Other | Admitting: Gastroenterology

## 2018-08-23 ENCOUNTER — Encounter: Payer: Self-pay | Admitting: Gastroenterology

## 2018-08-23 VITALS — BP 150/91 | HR 77 | Temp 97.0°F | Ht 65.0 in | Wt 220.2 lb

## 2018-08-23 DIAGNOSIS — R1010 Upper abdominal pain, unspecified: Secondary | ICD-10-CM | POA: Diagnosis not present

## 2018-08-23 MED ORDER — HYOSCYAMINE SULFATE SL 0.125 MG SL SUBL: 1.0000 | SUBLINGUAL_TABLET | Freq: Three times a day (TID) | SUBLINGUAL | 1 refills | Status: DC | PRN

## 2018-08-23 NOTE — Progress Notes (Signed)
Referring Provider: Octavio Graves, DO Primary Care Physician:  Octavio Graves, DO  Primary GI: Dr. Gala Romney   Chief Complaint  Patient presents with  . Abdominal Pain    upper abd, comes/goes. Happens randomly. Also has pain on the right side x 3 months-That pain is constant    HPI:   Michelle Zavala is a 72 y.o. female presenting today with a history of chronic abdominal pain, diarrhea, dysphagia. 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 Progression of SMA disease could cause issues, but clinical monitoring recommended per IR. She was seen by Vascular Surgery due to renal artery disease as well. Medical therapy recommended. Symptomatic fat-containing ventral hernia noted at visit in March 2017, undergoing hernia repair in March 2017. TCS/EGD with negative segmental colonic biopsies, needing surveillance in 5 years due to colonic adenomas. EGD with empiric dilatation, small hiatal hernia. Celiac serologies negative. 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 Negative hydrogen breath test for small bowel bacterial overgrowth. CTA updated 08/24/17: mixed plaque at origin of SMA but not felt to be greater than 50%. Celiac and IMA remained patent. Discussed with Dr. Earleen Newport, who recommended IR outpatient clinic evaluation. She was not felt to have chronic mesenteric ischemia   Upper abdominal pain prior to BM after eating out. Sometimes loose stool, sometimes solid. Takes something OTC daily prn to help with BMs, but she can't remember the name. Having RUQ discomfort, wrapping around back. Constant. Started about 3-4 months ago. Uses a rub on it from OTC, which helps. Movement worsens. Son passed way from colon cancer a few months ago. Daughter has cancer and not doing well. Not interested in talking with psychiatrist yet but will think about it. No suicidal or homicidal ideation.   Past Medical History:  Diagnosis Date  .  Anxiety   . Arthritis   . C2 cervical fracture ([place]) 09/17/13   Traumatic fracture witth minimal displacement  . Chronic lung disease    Fibrosis - Dr. Koleen Nimrod  . COPD (chronic obstructive pulmonary disease) ([place])   . Coronary atherosclerosis of native coronary artery    Mild atherosclerosis 3/10, LVEF 60-65%  . Depression   . Diverticulosis   . Essential hypertension, benign   . GERD (gastroesophageal reflux disease)   . PSVT (paroxysmal supraventricular tachycardia) ([place])     Past Surgical History:  Procedure Laterality Date  . ABDOMINAL HYSTERECTOMY    . ANTERIOR RELEASE VERTEBRAL BODY W/ POSTERIOR FUSION    . BACTERIAL OVERGROWTH TEST N/A 02/19/2016   Procedure: BACTERIAL OVERGROWTH TEST;  Surgeon: Daneil Dolin, MD;  [facility];  Service: Endoscopy;  Laterality: N/A;  0700  . BIOPSY N/A 08/04/2015   Procedure: BIOPSY;  Surgeon: Daneil Dolin, MD;  [facility];  Service: Endoscopy;  Laterality: N/A;  Gastric  . BIOPSY  02/21/2017   Procedure: BIOPSY;  Surgeon: Daneil Dolin, MD;  [facility];  Service: Endoscopy;;  ascending colon biopsy   . cataract surgery    . CESAREAN SECTION    . CHOLECYSTECTOMY  1973  . COLONOSCOPY  June 2016   Dr. Britta Mccreedy: moderate diverticulosis in sigmoid colon, surveillance in 5 years   . COLONOSCOPY WITH PROPOFOL N/A 02/21/2017   Dr. Gala Romney: diverticulosis in sigmoid colon, tubular adenomas, segmental biopsies benign. Surveillance in 5 years   . ESOPHAGOGASTRODUODENOSCOPY (EGD) WITH PROPOFOL N/A 08/04/2015   Dr. Rourk:abnormal gastric mucosa s/p biopsy.  Reactive gastropathy. Negative H.pylori  . ESOPHAGOGASTRODUODENOSCOPY (EGD) WITH PROPOFOL N/A 02/21/2017   Dr. Gala Romney: empiric esophageal dilatation, small hiatal hernia, otherwise normal  . INCISIONAL HERNIA REPAIR N/A 12/03/2015   Procedure: [name] HERNIORRHAPHY WITH MESH;  Surgeon: Aviva Signs, MD;  [facility];  Service: General;  Laterality: N/A;  .  INSERTION OF MESH  12/03/2015   Procedure: INSERTION OF MESH;  Surgeon: Aviva Signs, MD;  [facility];  Service: General;;  . IR RADIOLOGIST EVAL & MGMT  09/22/2017  . MALONEY DILATION N/A 02/21/2017   Procedure: [name] DILATION;  Surgeon: Daneil Dolin, MD;  [facility];  Service: Endoscopy;  Laterality: N/A;  . POLYPECTOMY  02/21/2017   Procedure: POLYPECTOMY;  Surgeon: Daneil Dolin, MD;  [facility];  Service: Endoscopy;;  hepatic flexure polyp cs  . TMJ ARTHROSCOPY Bilateral 02/04/2015   Procedure: BILATERAL TEMPOROMANDIBULAR JOINT (TMJ) ARTHROSCOPY MENISECTOMY WITH FAT GRAFT FROM ABDOMEN ;  Surgeon: Diona Browner, DDS;  Location: [facility];  Service: Oral Surgery;  Laterality: Bilateral;  . TOTAL KNEE ARTHROPLASTY Bilateral   . TUBAL LIGATION      Current Outpatient Medications  Medication Sig Dispense Refill  . acetaminophen (TYLENOL) 650 MG CR tablet Take 1,300 mg by mouth at bedtime as needed for pain.     [name] albuterol (PROAIR HFA) 108 (90 Base) MCG/ACT inhaler Inhale 2 puffs into the lungs every 4 (four) hours as needed for wheezing or shortness of breath.    [name] amitriptyline (ELAVIL) 75 MG tablet Take 75 mg by mouth at bedtime.  6  . fluticasone (FLONASE) 50 MCG/ACT nasal spray Place 2 sprays into both nostrils daily as needed for allergies or rhinitis.    [name] ipratropium-albuterol (DUONEB) 0.5-2.5 (3) MG/3ML SOLN Take 3 mLs by nebulization every 4 (four) hours as needed.    [name] LORazepam (ATIVAN) 2 MG tablet Take 1 mg by mouth. Takes 1mg  in the morning and 1mg  at night    . metoprolol succinate (TOPROL-XL) 50 MG 24 hr tablet TAKE ONE TABLET BY MOUTH TWICE DAILY. 180 tablet 1  . pantoprazole (PROTONIX) 40 MG tablet Take 1 tablet (40 mg total) by mouth 2 (two) times daily. 60 tablet 1  . sertraline (ZOLOFT) 100 MG tablet Take 100 mg by mouth daily.     . vitamin B-12 (CYANOCOBALAMIN) 100 MCG tablet Take 100 mcg by mouth daily.    . Vitamin D, Ergocalciferol,  (DRISDOL) 50000 units CAPS capsule Take 50,000 Units by mouth every 7 (seven) days. Saturday    . Hyoscyamine Sulfate SL (LEVSIN/SL) 0.125 MG SUBL Place 1 tablet under the tongue every 8 (eight) hours as needed. For cramping and urgent stools. 90 each 1   No current facility-administered medications for this visit.     Allergies as of 08/23/2018 - Review Complete 08/23/2018  Allergen Reaction Noted  . Sulfonamide derivatives Hives     Family History  Problem Relation Age of Onset  . Coronary artery disease Father        Premature  . Heart disease Father   . Coronary artery disease Brother        Premature  . Coronary artery disease Sister        Premature  . Colon cancer Neg Hx     Social History   Socioeconomic History  . Marital status: Widowed    Spouse name: Not on file  . Number of children: 3  . Years of education: 7  . Highest  education level: Not on file  Occupational History  . Occupation: Retired  Scientific laboratory technician  . Financial resource strain: Not on file  . Food insecurity:    Worry: Not on file    Inability: Not on file  . Transportation needs:    Medical: Not on file    Non-medical: Not on file  Tobacco Use  . Smoking status: Never Smoker  . Smokeless tobacco: Never Used  Substance and Sexual Activity  . Alcohol use: No    Alcohol/week: 0.0 standard drinks  . Drug use: No  . Sexual activity: Never    Birth control/protection: None, Surgical  Lifestyle  . Physical activity:    Days per week: Not on file    Minutes per session: Not on file  . Stress: Not on file  Relationships  . Social connections:    Talks on phone: Not on file    Gets together: Not on file    Attends religious service: Not on file    Active member of club or organization: Not on file    Attends meetings of clubs or organizations: Not on file    Relationship status: Not on file  Other Topics Concern  . Not on file  Social History Narrative   Occasionally drinks [employer]eps[employer]      Review of Systems: Gen: Denies fever, chills, anorexia. Denies fatigue, weakness, weight loss.  CV: Denies chest pain, palpitations, syncope, peripheral edema, and claudication. Resp: Denies dyspnea at rest, cough, wheezing, coughing up blood, and pleurisy. GI: see HPI  Derm: Denies rash, itching, dry skin Psych: Denies depression, anxiety, memory loss, confusion. No homicidal or suicidal ideation.  Heme: Denies bruising, bleeding, and enlarged lymph nodes.  Physical Exam: BP (!) 150/91   Pulse 77   Temp (!) 97 F (36.1 C) (Oral)   Ht 5\' 5"  (1.651 m)   Wt 220 lb 3.2 oz (99.9 kg)   BMI 36.64 kg/m  General:   Alert and oriented. No distress noted. Pleasant and cooperative.  Head:  Normocephalic and atraumatic. Eyes:  Conjuctiva clear without scleral icterus. Mouth:  Oral mucosa pink and moist. Good dentition. No lesions. Abdomen:  +BS, soft, non-tender and non-distended. No rebound or guarding. No HSM or masses noted. Msk:  Symmetrical without gross deformities. Normal posture. Extremities:  Without edema. Neurologic:  Alert and  oriented x4 Psych:  Alert and cooperative. Normal mood and affect.]

## 2018-08-23 NOTE — Patient Instructions (Signed)
Please let me know if you need to talk to someone in the future. I will be thinking of you.  I have sent in Levsin to take as needed for abdominal cramping.   I will see you in 6 months!  I enjoyed seeing you again today! As you know, I value our relationship and want to provide genuine, compassionate, and quality care. I welcome your feedback. If you receive a survey regarding your visit,  I greatly appreciate you taking time to fill this out. See you next time!  Annitta Needs, PhD, ANP-BC [facility]

## 2018-08-29 NOTE — Progress Notes (Signed)
cc'ed to pcp °

## 2018-08-29 NOTE — Assessment & Plan Note (Signed)
Chronic abdominal pain s/p thorough evaluation. Appears to be dealing with discomfort related to musculoskeletal etiology currently. No alarmin GI signs/symptoms. Due to intermittent loose stool and cramping, I have sent in Levsin to take just as needed. Return in 6 months or sooner if needed.

## 2018-09-21 ENCOUNTER — Telehealth: Payer: Self-pay | Admitting: Internal Medicine

## 2018-09-21 NOTE — Telephone Encounter (Signed)
Pt wants to speak with nurse about her bowels. [phone]

## 2018-09-21 NOTE — Telephone Encounter (Signed)
Pt called stating that she needs a TCS.  After she has a bowel movement, more oozes out about the size of a teaspoon. Soreness and burning is present at the rectum after she wipes. Pt is use otc Preparation H 3-4 times a day. Pt states that she has hemorrhoids on the outside of the rectum. She reports no abdominal pain but gets nauseated from time to time and takes nausea medication.

## 2018-09-22 NOTE — Telephone Encounter (Signed)
Colonoscopy completed June 2018. She is reporting what is clinically consistent with internal hemorrhoids. Could benefit from banding. Can arrange a routine office visit and we can discuss and potentially band at that time. I don't believe she is on any anticoagulation, but we need to make sure.

## 2018-09-25 ENCOUNTER — Encounter: Payer: Self-pay | Admitting: Internal Medicine

## 2018-09-25 NOTE — Telephone Encounter (Signed)
Left a detailed message for pt. Please schedule OV to discuss banding procedure.

## 2018-09-25 NOTE — Telephone Encounter (Signed)
SCHEDULED AND LETTER SENT  °

## 2018-11-03 ENCOUNTER — Ambulatory Visit (INDEPENDENT_AMBULATORY_CARE_PROVIDER_SITE_OTHER): Payer: Medicare Other | Admitting: Cardiology

## 2018-11-03 ENCOUNTER — Encounter: Payer: Self-pay | Admitting: Cardiology

## 2018-11-03 VITALS — BP 118/80 | HR 98 | Ht 65.0 in | Wt 221.4 lb

## 2018-11-03 DIAGNOSIS — I471 Supraventricular tachycardia: Secondary | ICD-10-CM

## 2018-11-03 DIAGNOSIS — I10 Essential (primary) hypertension: Secondary | ICD-10-CM

## 2018-11-03 DIAGNOSIS — I2510 Atherosclerotic heart disease of native coronary artery without angina pectoris: Secondary | ICD-10-CM

## 2018-11-03 NOTE — Addendum Note (Signed)
Addended by: Merlene Laughter on: 11/03/2018 02:20 PM   Modules accepted: Orders

## 2018-11-03 NOTE — Progress Notes (Signed)
Cardiology Office Note  Date: 11/03/2018   ID: November, Sypher Jan 21, 1946, MRN [id number]  PCP: Octavio Graves, DO  Primary Cardiologist: Rozann Lesches, MD   Chief Complaint  Patient presents with  . Cardiac follow-up    History of Present Illness: Michelle Zavala is a 73 y.o. female last seen in May 2019.  She is here for a routine follow-up visit.  She does not report any progressive angina symptoms, remains chronically short of breath and has had some intermittent wheezing.  She has not had a recent visit with Pulmonary.  She has been under a tremendous amount of stress.  She states that one of her sons died recently from colon cancer, and that her daughter that lives in [place] is now battling lung cancer.  Follow-up Myoview in May 2019 was low risk as outlined below.  I discussed the results with her today.  I personally reviewed her ECG which shows sinus tachycardia at 103 bpm with leftward axis and nonspecific ST changes.  Heart rate settled down into the 80s at rest.  She has had no breakthrough rapid palpitations to suggest recurring PSVT.  She continues on Toprol-XL 50 mg twice daily.   Past Medical History:  Diagnosis Date  . Anxiety   . Arthritis   . C2 cervical fracture ([place]) 09/17/13   Traumatic fracture witth minimal displacement  . Chronic lung disease    Fibrosis - Dr. Koleen Nimrod  . COPD (chronic obstructive pulmonary disease) ([place])   . Coronary atherosclerosis of native coronary artery    Mild atherosclerosis 3/10, LVEF 60-65%  . Depression   . Diverticulosis   . Essential hypertension, benign   . GERD (gastroesophageal reflux disease)   . PSVT (paroxysmal supraventricular tachycardia) ([place])     Past Surgical History:  Procedure Laterality Date  . ABDOMINAL HYSTERECTOMY    . ANTERIOR RELEASE VERTEBRAL BODY W/ POSTERIOR FUSION    . BACTERIAL OVERGROWTH TEST N/A 02/19/2016   Procedure: BACTERIAL OVERGROWTH TEST;  Surgeon: Daneil Dolin, MD;  [facility];  Service: Endoscopy;  Laterality: N/A;  0700  . BIOPSY N/A 08/04/2015   Procedure: BIOPSY;  Surgeon: Daneil Dolin, MD;  [facility];  Service: Endoscopy;  Laterality: N/A;  Gastric  . BIOPSY  02/21/2017   Procedure: BIOPSY;  Surgeon: Daneil Dolin, MD;  [facility];  Service: Endoscopy;;  ascending colon biopsy   . cataract surgery    . CESAREAN SECTION    . CHOLECYSTECTOMY  1973  . COLONOSCOPY  June 2016   Dr. Britta Mccreedy: moderate diverticulosis in sigmoid colon, surveillance in 5 years   . COLONOSCOPY WITH PROPOFOL N/A 02/21/2017   Dr. Gala Romney: diverticulosis in sigmoid colon, tubular adenomas, segmental biopsies benign. Surveillance in 5 years   . ESOPHAGOGASTRODUODENOSCOPY (EGD) WITH PROPOFOL N/A 08/04/2015   Dr. Rourk:abnormal gastric mucosa s/p biopsy. Reactive gastropathy. Negative H.pylori  . ESOPHAGOGASTRODUODENOSCOPY (EGD) WITH PROPOFOL N/A 02/21/2017   Dr. Gala Romney: empiric esophageal dilatation, small hiatal hernia, otherwise normal  . INCISIONAL HERNIA REPAIR N/A 12/03/2015   Procedure: [name] HERNIORRHAPHY WITH MESH;  Surgeon: Aviva Signs, MD;  [facility];  Service: General;  Laterality: N/A;  . INSERTION OF MESH  12/03/2015   Procedure: INSERTION OF MESH;  Surgeon: Aviva Signs, MD;  [facility];  Service: General;;  . IR RADIOLOGIST EVAL & MGMT  09/22/2017  . MALONEY DILATION N/A 02/21/2017   Procedure: [name] DILATION;  Surgeon: Daneil Dolin, MD;  [facility];  Service: Endoscopy;  Laterality: N/A;  . POLYPECTOMY  02/21/2017   Procedure: POLYPECTOMY;  Surgeon: Daneil Dolin, MD;  [facility];  Service: Endoscopy;;  hepatic flexure polyp cs  . TMJ ARTHROSCOPY Bilateral 02/04/2015   Procedure: BILATERAL TEMPOROMANDIBULAR JOINT (TMJ) ARTHROSCOPY MENISECTOMY WITH FAT GRAFT FROM ABDOMEN ;  Surgeon: Diona Browner, DDS;  Location: [facility];  Service: Oral Surgery;  Laterality: Bilateral;  . TOTAL KNEE ARTHROPLASTY  Bilateral   . TUBAL LIGATION      Current Outpatient Medications  Medication Sig Dispense Refill  . acetaminophen (TYLENOL) 650 MG CR tablet Take 1,300 mg by mouth at bedtime as needed for pain.     [name] albuterol (PROAIR HFA) 108 (90 Base) MCG/ACT inhaler Inhale 2 puffs into the lungs every 4 (four) hours as needed for wheezing or shortness of breath.    [name] amitriptyline (ELAVIL) 75 MG tablet Take 75 mg by mouth at bedtime.  6  . Biotin w/ Vitamins C & E (HAIR/SKIN/NAILS PO) Take 1 tablet by mouth 2 (two) times daily.    . celecoxib (CELEBREX) 100 MG capsule Take 100 mg by mouth daily.  0  . fluticasone (FLONASE) 50 MCG/ACT nasal spray Place 2 sprays into both nostrils daily as needed for allergies or rhinitis.    . hydrochlorothiazide (HYDRODIURIL) 25 MG tablet Take 25 mg by mouth every morning.    [name] ipratropium-albuterol (DUONEB) 0.5-2.5 (3) MG/3ML SOLN Take 3 mLs by nebulization every 4 (four) hours as needed.    [name] LORazepam (ATIVAN) 1 MG tablet Take 1 mg by mouth 2 (two) times daily.    . metoprolol succinate (TOPROL-XL) 50 MG 24 hr tablet TAKE ONE TABLET BY MOUTH TWICE DAILY. 180 tablet 1  . Multiple Vitamins-Minerals (PRESERVISION AREDS PO) Take 1 tablet by mouth daily.    . pantoprazole (PROTONIX) 40 MG tablet Take 1 tablet (40 mg total) by mouth 2 (two) times daily. 60 tablet 1  . potassium gluconate 595 (99 K) MG TABS tablet Take 595 mg by mouth 2 (two) times daily.    . sertraline (ZOLOFT) 100 MG tablet Take 100 mg by mouth daily.     . vitamin B-12 (CYANOCOBALAMIN) 100 MCG tablet Take 100 mcg by mouth daily.    . Vitamin D, Ergocalciferol, (DRISDOL) 50000 units CAPS capsule Take 50,000 Units by mouth every 7 (seven) days. Saturday     No current facility-administered medications for this visit.    Allergies:  Sulfonamide derivatives   Social History: The patient  reports that she has never smoked. She has never used smokeless tobacco. She reports that she does not drink alcohol or  use drugs.  ROS:  Please see the history of present illness. Otherwise, complete review of systems is positive for chronic dyspnea.  All other systems are reviewed and negative.   Physical Exam: VS:  BP 118/80   Pulse 98   Ht 5\' 5"  (1.651 m)   Wt 221 lb 6.4 oz (100.4 kg)   SpO2 95%   BMI 36.84 kg/m , BMI Body mass index is 36.84 kg/m.  Wt Readings from Last 3 Encounters:  11/03/18 221 lb 6.4 oz (100.4 kg)  08/23/18 220 lb 3.2 oz (99.9 kg)  01/17/18 213 lb 6.4 oz (96.8 kg)    General: Patient appears comfortable at rest. HEENT: Conjunctiva and lids normal, oropharynx clear. Neck: Supple, no elevated JVP or carotid bruits, no thyromegaly. Lungs: Decreased breath sounds without wheezing, nonlabored breathing at  rest. Cardiac: Regular rate and rhythm, no S3 or significant systolic murmur. Abdomen: Soft, nontender, bowel sounds present. Extremities: No pitting edema, distal pulses 2+. Skin: Warm and dry. Musculoskeletal: No kyphosis. Neuropsychiatric: Alert and oriented x3, affect grossly appropriate.  ECG: I personally reviewed the tracing from 08/01/2017 which showed sinus rhythm with decreased R wave progression and leftward axis.  Recent Labwork:  02/02/2017: Pro B Natriuretic peptide (BNP) 71.0; TSH 1.54 08/11/2017: BUN 17; Creat 1.11; Potassium 3.7; Sodium 137 10/25/2017: Hemoglobin 13.4; Platelets 261   Other Studies Reviewed Today:  Echocardiogram11/29/2017: Study Conclusions  - Left ventricle: The cavity size was normal. Wall thickness was increased in a pattern of mild LVH. Systolic function was normal. The estimated ejection fraction was in the range of 55% to 60%. Doppler parameters are consistent with abnormal left ventricular relaxation (grade 1 diastolic dysfunction). - Aortic valve: There was mild regurgitation. Valve area (VTI): 2.42 cm^2. Valve area (Vmax): 2.22 cm^2. Valve area (Vmean): 2.29 cm^2. - Technically difficult study.  Lexiscan  Myoview 01/26/2018:  There was no ST segment deviation noted during stress.  The study is normal. There are no perfusion defects  This is a low risk study.  The left ventricular ejection fraction is normal (55-65%).  Assessment and Plan:  1.  History of nonobstructive CAD by previous work-up with follow-up Myoview in May 2019 showing no evidence of ischemia and normal LVEF.  2.  PSVT, no obvious recurrences on Toprol-XL.  3.  Essential hypertension, blood pressure is well controlled today.  Continue hydrochlorothiazide.  Keep follow-up with PCP.  Current medicines were reviewed with the patient today.   Disposition: Follow-up in 6 months.  Signed, Satira Sark, MD, [facility] 11/03/2018 1:54 PM    [facility] [address], [place], [place] [address] Phone: [phone]; Fax: [phone]

## 2018-11-03 NOTE — Patient Instructions (Addendum)

## 2018-11-14 ENCOUNTER — Telehealth: Payer: Self-pay | Admitting: Cardiology

## 2018-11-14 IMAGING — DX DG CHEST 2V
2 series · 2 of 2 positions shown · non-contrast
Comparison: 10/28/2016 .  10/06/2016.

CLINICAL DATA: Shortness of breath.

EXAM:
CHEST  2 VIEW

[chest pa]
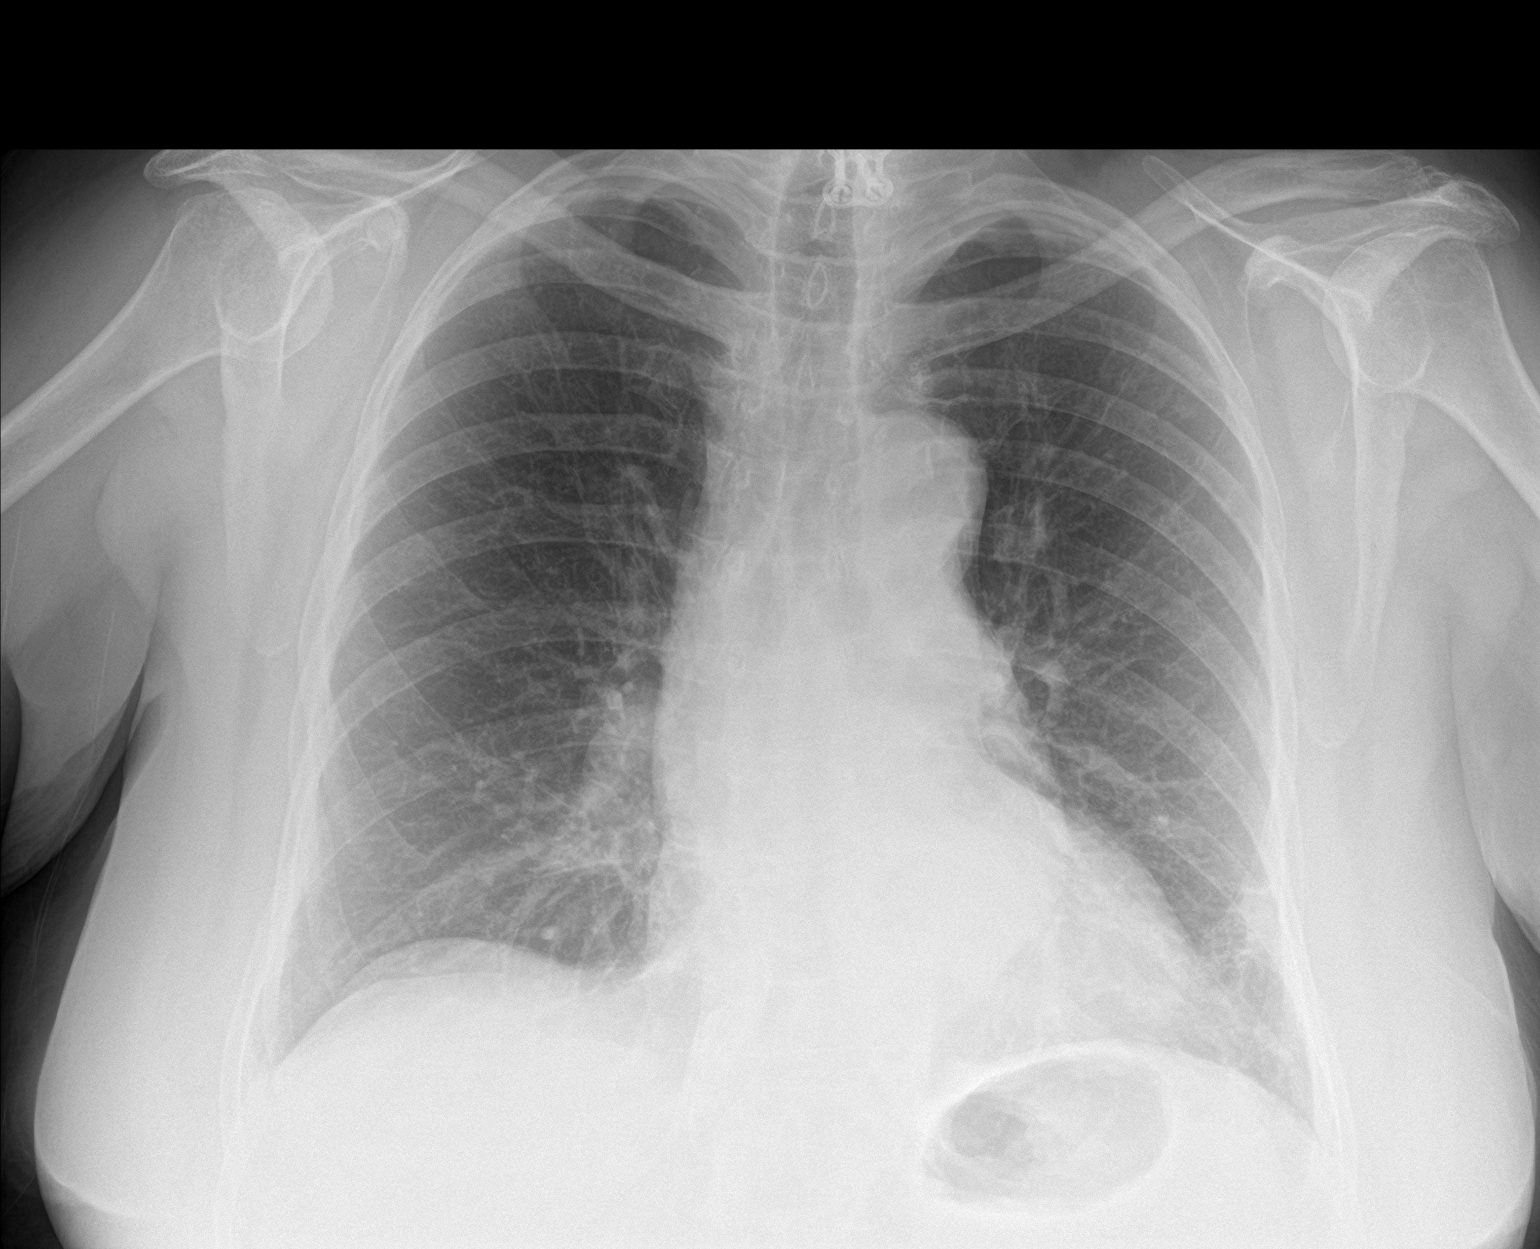

[chest lat]
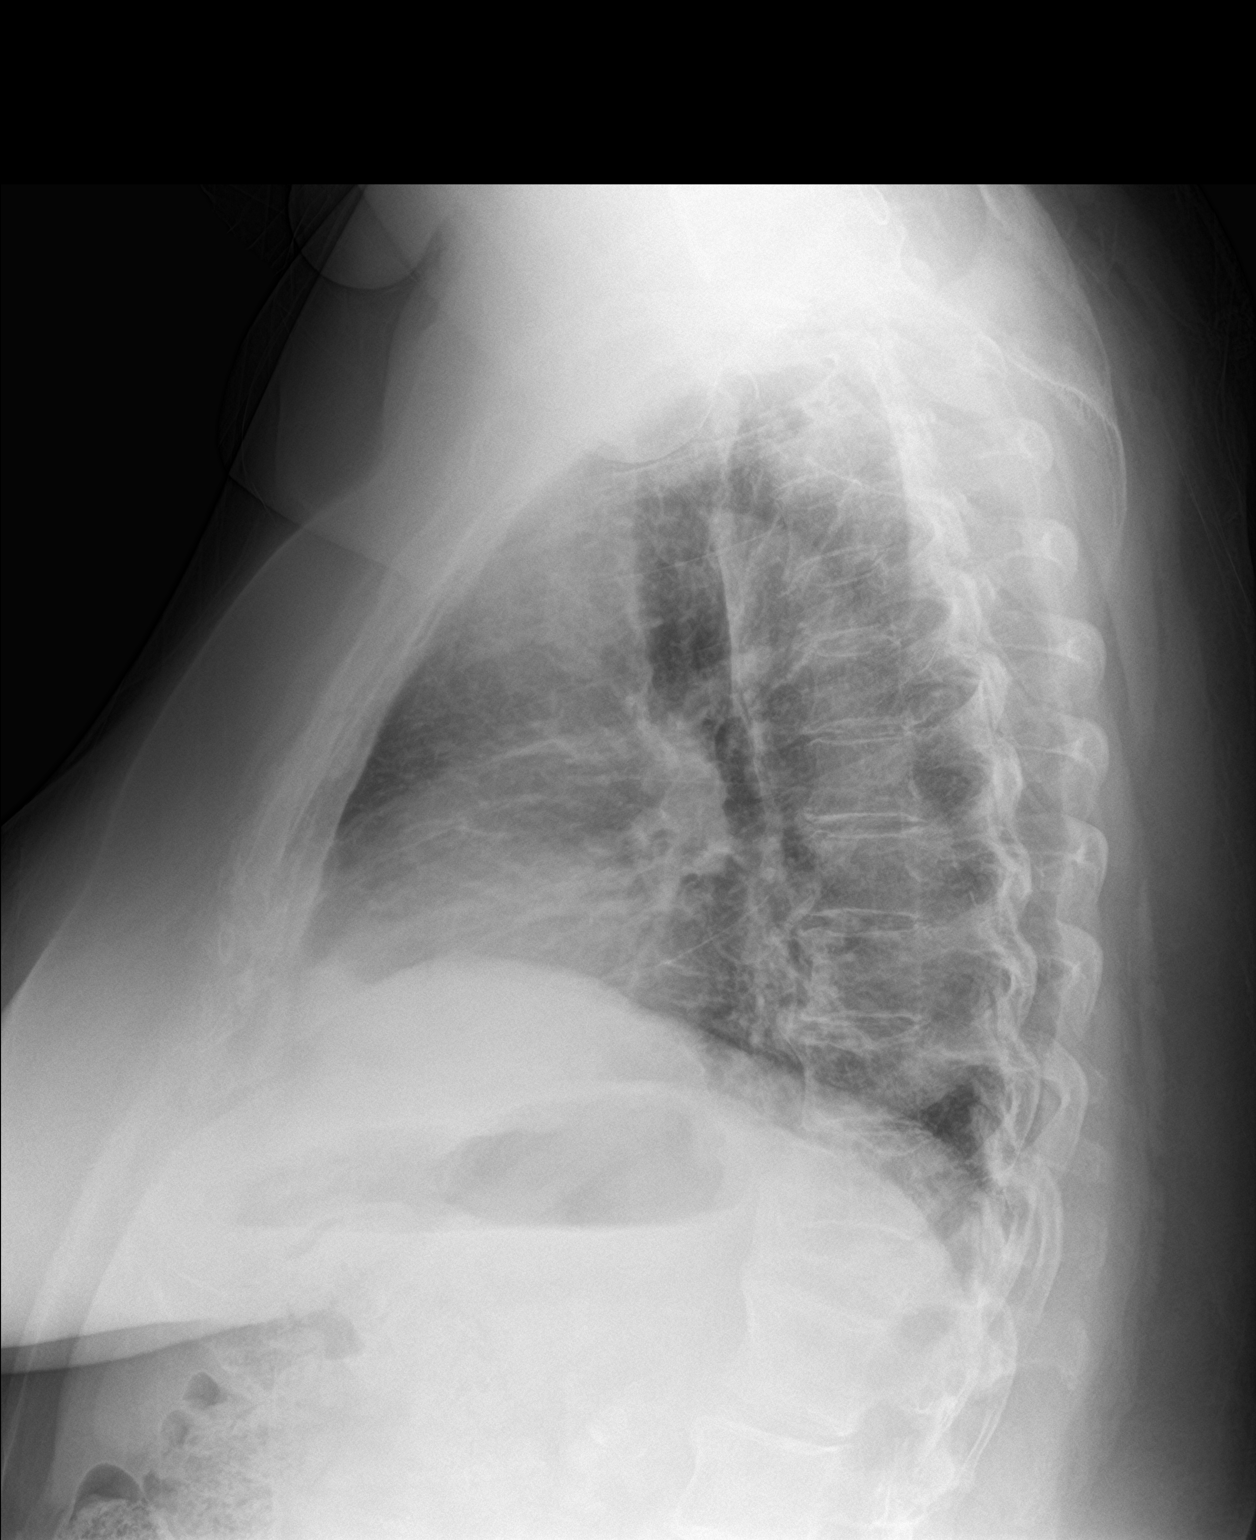

[2 of 2 positions shown; findings below may reference images not displayed]

FINDINGS: Mediastinum and hilar structures are normal. Mild left base
pleuroparenchymal thickening consistent scarring again noted.
Superimposed mild infiltrate cannot be excluded. Stable
cardiomegaly. No pulmonary venous congestion. Prior cervicothoracic
spine fusion.
IMPRESSION: Mild left base pleuroparenchymal thickening consistent scarring.
Superimposed mild infiltrate cannot be excluded.

## 2018-11-14 NOTE — Telephone Encounter (Signed)
Spoke with patient and she called to get the name and number of the pulmonologist she saw in the past since Dr. Domenic Polite requested that she follow up with them at her last office visit. Pulmonologist name & number given to patient (Dr. Melvyn Novas)

## 2018-11-14 NOTE — Telephone Encounter (Signed)
Patient called requesting to be referred over to [facility] Pulmonary doctors.

## 2018-11-28 ENCOUNTER — Other Ambulatory Visit: Payer: Self-pay | Admitting: Cardiology

## 2018-12-05 ENCOUNTER — Other Ambulatory Visit: Payer: Self-pay

## 2018-12-05 ENCOUNTER — Encounter (INDEPENDENT_AMBULATORY_CARE_PROVIDER_SITE_OTHER): Payer: Medicare Other | Admitting: Ophthalmology

## 2018-12-05 DIAGNOSIS — H43813 Vitreous degeneration, bilateral: Secondary | ICD-10-CM | POA: Diagnosis not present

## 2018-12-05 DIAGNOSIS — H35033 Hypertensive retinopathy, bilateral: Secondary | ICD-10-CM

## 2018-12-05 DIAGNOSIS — H26493 Other secondary cataract, bilateral: Secondary | ICD-10-CM

## 2018-12-05 DIAGNOSIS — I10 Essential (primary) hypertension: Secondary | ICD-10-CM

## 2018-12-05 DIAGNOSIS — H353122 Nonexudative age-related macular degeneration, left eye, intermediate dry stage: Secondary | ICD-10-CM | POA: Diagnosis not present

## 2018-12-06 ENCOUNTER — Ambulatory Visit: Payer: Medicare Other | Admitting: Gastroenterology

## 2018-12-14 DIAGNOSIS — K210 Gastro-esophageal reflux disease with esophagitis: Secondary | ICD-10-CM | POA: Diagnosis not present

## 2018-12-14 DIAGNOSIS — I10 Essential (primary) hypertension: Secondary | ICD-10-CM | POA: Diagnosis not present

## 2018-12-14 DIAGNOSIS — M545 Low back pain: Secondary | ICD-10-CM | POA: Diagnosis not present

## 2019-01-04 DIAGNOSIS — K210 Gastro-esophageal reflux disease with esophagitis: Secondary | ICD-10-CM | POA: Diagnosis not present

## 2019-01-04 DIAGNOSIS — I10 Essential (primary) hypertension: Secondary | ICD-10-CM | POA: Diagnosis not present

## 2019-01-04 DIAGNOSIS — M109 Gout, unspecified: Secondary | ICD-10-CM | POA: Diagnosis not present

## 2019-01-04 DIAGNOSIS — Z1389 Encounter for screening for other disorder: Secondary | ICD-10-CM | POA: Diagnosis not present

## 2019-01-04 DIAGNOSIS — M545 Low back pain: Secondary | ICD-10-CM | POA: Diagnosis not present

## 2019-01-04 DIAGNOSIS — R7303 Prediabetes: Secondary | ICD-10-CM | POA: Diagnosis not present

## 2019-01-04 DIAGNOSIS — Z0000 Encounter for general adult medical examination without abnormal findings: Secondary | ICD-10-CM | POA: Diagnosis not present

## 2019-01-04 DIAGNOSIS — J449 Chronic obstructive pulmonary disease, unspecified: Secondary | ICD-10-CM | POA: Diagnosis not present

## 2019-01-09 ENCOUNTER — Encounter (INDEPENDENT_AMBULATORY_CARE_PROVIDER_SITE_OTHER): Payer: Medicare Other | Admitting: Ophthalmology

## 2019-01-09 ENCOUNTER — Other Ambulatory Visit: Payer: Self-pay

## 2019-01-09 DIAGNOSIS — H26492 Other secondary cataract, left eye: Secondary | ICD-10-CM | POA: Diagnosis not present

## 2019-01-10 DIAGNOSIS — M7061 Trochanteric bursitis, right hip: Secondary | ICD-10-CM | POA: Diagnosis not present

## 2019-01-10 DIAGNOSIS — M7062 Trochanteric bursitis, left hip: Secondary | ICD-10-CM | POA: Diagnosis not present

## 2019-01-11 ENCOUNTER — Ambulatory Visit (INDEPENDENT_AMBULATORY_CARE_PROVIDER_SITE_OTHER): Payer: Medicare Other | Admitting: Orthopaedic Surgery

## 2019-02-02 ENCOUNTER — Other Ambulatory Visit: Payer: Self-pay | Admitting: Internal Medicine

## 2019-02-02 DIAGNOSIS — M549 Dorsalgia, unspecified: Secondary | ICD-10-CM | POA: Diagnosis not present

## 2019-02-07 ENCOUNTER — Encounter: Payer: Self-pay | Admitting: Gastroenterology

## 2019-02-07 ENCOUNTER — Other Ambulatory Visit: Payer: Self-pay

## 2019-02-07 ENCOUNTER — Ambulatory Visit (INDEPENDENT_AMBULATORY_CARE_PROVIDER_SITE_OTHER): Payer: Medicare Other | Admitting: Gastroenterology

## 2019-02-07 DIAGNOSIS — R197 Diarrhea, unspecified: Secondary | ICD-10-CM | POA: Diagnosis not present

## 2019-02-07 MED ORDER — HYDROCORTISONE (PERIANAL) 2.5 % EX CREA: 1.0000 "application " | TOPICAL_CREAM | Freq: Two times a day (BID) | CUTANEOUS | 1 refills | Status: DC

## 2019-02-07 NOTE — Patient Instructions (Signed)
Follow a bland diet for now. Call if persistent or worsening diarrhea!  I have sent in anusol cream to use per rectum twice a day for the next 7 days.  We will see you in about 6 weeks in the office!  I enjoyed talking with you again today! As you know, I value our relationship and want to provide genuine, compassionate, and quality care. I welcome your feedback. If you receive a survey regarding your visit,  I greatly appreciate you taking time to fill this out. See you next time!  Annitta Needs, PhD, ANP-BC [facility] Gastroenterology

## 2019-02-07 NOTE — Progress Notes (Signed)
Primary Care Physician:  Octavio Graves, DO  Primary GI: Dr. Gala Romney   Patient Location: Home   Provider Location: [facility] office   Reason for Visit: Follow-up    Persons present on the virtual encounter, with roles: Patient    Total time (minutes) spent on medical discussion: 15 minutes   Due to COVID-19, visit was conducted using virtual method.  Visit was requested by patient.  Virtual Visit via Telephone Note Due to COVID-19, visit is conducted virtually and was requested by patient.   I connected with Darrol Poke on 02/07/19 at  3:30 PM EDT by telephone and verified that I am speaking with the correct person using two identifiers.   I discussed the limitations, risks, security and privacy concerns of performing an evaluation and management service by telephone and the availability of in person appointments. I also discussed with the patient that there may be a patient responsible charge related to this service. The patient expressed understanding and agreed to proceed.  Chief Complaint  Patient presents with  . Abdominal Pain    upper and lower abd  . Diarrhea    having some leakage     History of Present Illness: 73 year old female with history of chronic abdominal pain, diarrhea, dysphagia. 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 Progression of SMA disease could cause issues, but clinical monitoring recommended per IR. She was seen by Vascular Surgery due to renal artery disease as well. Medical therapy recommended. Symptomatic fat-containing ventral hernia noted at visit in March 2017, undergoing hernia repair in March 2017. TCS/EGD with negative segmental colonic biopsies, needing surveillance in 5 years due to colonic adenomas. EGD with empiric dilatation, small hiatal hernia. Celiac serologies negative. 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 Negative hydrogen breath test for small bowel bacterial  overgrowth.CTA updated 08/24/17: mixed plaque at origin of SMA but not felt to be greater than 50%. Celiac and IMA remained patent. Discussed with Dr. Earleen Newport, who recommended IR outpatient clinic evaluation. She was not felt to have chronic mesenteric ischemia.   Hurting in right side and had the runs since Saturday. RLQ discomfort and radiates to back. Saturday stayed in the bathroom and Sunday. Stool is watery. Thinks there is a type of smell to her body and smells something even after showering. 2 BMs today. Stool is slimy. Worried about infection. No sick contacts. No recent antibiotics. No fever/chills. Diarrhea is improving but had so much diarrhea her rectum felt burnt up. No rectal bleeding. Has to wipe a lot after BM. Sometimes leakage. Smears in underwear. Some itching/burning.   Doesn't have much energy. Had a woozy head for the last few days. Feels like sinuses are flaring. Ativan has been decreased. Daughter not expected to live for much longer. Would like referral to a PCP in [place].   Past Medical History:  Diagnosis Date  . Anxiety   . Arthritis   . C2 cervical fracture ([place]) 09/17/13   Traumatic fracture witth minimal displacement  . Chronic lung disease    Fibrosis - Dr. Koleen Nimrod  . COPD (chronic obstructive pulmonary disease) ([place])   . Coronary atherosclerosis of native coronary artery    Mild atherosclerosis 3/10, LVEF 60-65%  . Depression   . Diverticulosis   . Essential hypertension, benign   . GERD (gastroesophageal reflux disease)   . PSVT (paroxysmal supraventricular tachycardia) ([place])      Past Surgical History:  Procedure  Laterality Date  . ABDOMINAL HYSTERECTOMY    . ANTERIOR RELEASE VERTEBRAL BODY W/ POSTERIOR FUSION    . BACTERIAL OVERGROWTH TEST N/A 02/19/2016   Procedure: BACTERIAL OVERGROWTH TEST;  Surgeon: Daneil Dolin, MD;  [facility];  Service: Endoscopy;  Laterality: N/A;  0700  . BIOPSY N/A 08/04/2015   Procedure: BIOPSY;   Surgeon: Daneil Dolin, MD;  [facility];  Service: Endoscopy;  Laterality: N/A;  Gastric  . BIOPSY  02/21/2017   Procedure: BIOPSY;  Surgeon: Daneil Dolin, MD;  [facility];  Service: Endoscopy;;  ascending colon biopsy   . cataract surgery    . CESAREAN SECTION    . CHOLECYSTECTOMY  1973  . COLONOSCOPY  June 2016   Dr. Britta Mccreedy: moderate diverticulosis in sigmoid colon, surveillance in 5 years   . COLONOSCOPY WITH PROPOFOL N/A 02/21/2017   Dr. Gala Romney: diverticulosis in sigmoid colon, tubular adenomas, segmental biopsies benign. Surveillance in 5 years   . ESOPHAGOGASTRODUODENOSCOPY (EGD) WITH PROPOFOL N/A 08/04/2015   Dr. Rourk:abnormal gastric mucosa s/p biopsy. Reactive gastropathy. Negative H.pylori  . ESOPHAGOGASTRODUODENOSCOPY (EGD) WITH PROPOFOL N/A 02/21/2017   Dr. Gala Romney: empiric esophageal dilatation, small hiatal hernia, otherwise normal  . INCISIONAL HERNIA REPAIR N/A 12/03/2015   Procedure: [name] HERNIORRHAPHY WITH MESH;  Surgeon: Aviva Signs, MD;  [facility];  Service: General;  Laterality: N/A;  . INSERTION OF MESH  12/03/2015   Procedure: INSERTION OF MESH;  Surgeon: Aviva Signs, MD;  [facility];  Service: General;;  . IR RADIOLOGIST EVAL & MGMT  09/22/2017  . MALONEY DILATION N/A 02/21/2017   Procedure: [name] DILATION;  Surgeon: Daneil Dolin, MD;  [facility];  Service: Endoscopy;  Laterality: N/A;  . POLYPECTOMY  02/21/2017   Procedure: POLYPECTOMY;  Surgeon: Daneil Dolin, MD;  [facility];  Service: Endoscopy;;  hepatic flexure polyp cs  . TMJ ARTHROSCOPY Bilateral 02/04/2015   Procedure: BILATERAL TEMPOROMANDIBULAR JOINT (TMJ) ARTHROSCOPY MENISECTOMY WITH FAT GRAFT FROM ABDOMEN ;  Surgeon: Diona Browner, DDS;  Location: [facility];  Service: Oral Surgery;  Laterality: Bilateral;  . TOTAL KNEE ARTHROPLASTY Bilateral   . TUBAL LIGATION       Current Meds  Medication Sig  . acetaminophen (TYLENOL) 650 MG CR tablet  Take 1,300 mg by mouth at bedtime as needed for pain.   [name] albuterol (PROAIR HFA) 108 (90 Base) MCG/ACT inhaler Inhale 2 puffs into the lungs every 4 (four) hours as needed for wheezing or shortness of breath.  [name] amitriptyline (ELAVIL) 75 MG tablet Take 75 mg by mouth at bedtime.  . Biotin w/ Vitamins C & E (HAIR/SKIN/NAILS PO) Take 1 tablet by mouth 2 (two) times daily.  . fluticasone (FLONASE) 50 MCG/ACT nasal spray Place 2 sprays into both nostrils daily as needed for allergies or rhinitis.  . hydrochlorothiazide (HYDRODIURIL) 25 MG tablet Take 25 mg by mouth every morning.  [name] ipratropium-albuterol (DUONEB) 0.5-2.5 (3) MG/3ML SOLN Take 3 mLs by nebulization every 4 (four) hours as needed.  [name] LORazepam (ATIVAN) 1 MG tablet Take 1 mg by mouth 2 (two) times daily.  . metoprolol succinate (TOPROL-XL) 50 MG 24 hr tablet TAKE ONE TABLET BY MOUTH TWICE DAILY.  . Multiple Vitamins-Minerals (PRESERVISION AREDS PO) Take 1 tablet by mouth daily.  . Omega-3 Fatty Acids (FISH OIL) 1000 MG CAPS Take 1 capsule by mouth daily.  . pantoprazole (PROTONIX) 40 MG tablet Take 1 tablet (40 mg total) by mouth  2 (two) times daily.  . sertraline (ZOLOFT) 100 MG tablet Take 100 mg by mouth daily.   . vitamin B-12 (CYANOCOBALAMIN) 100 MCG tablet Take 100 mcg by mouth daily.  . Vitamin D, Ergocalciferol, (DRISDOL) 50000 units CAPS capsule Take 50,000 Units by mouth every 7 (seven) days. Saturday     Family History  Problem Relation Age of Onset  . Coronary artery disease Father        Premature  . Heart disease Father   . Coronary artery disease Brother        Premature  . Coronary artery disease Sister        Premature  . Colon cancer Neg Hx     Social History   Socioeconomic History  . Marital status: Widowed    Spouse name: Not on file  . Number of children: 3  . Years of education: 7  . Highest education level: Not on file  Occupational History  . Occupation: Retired  Scientific laboratory technician  . Financial  resource strain: Not on file  . Food insecurity:    Worry: Not on file    Inability: Not on file  . Transportation needs:    Medical: Not on file    Non-medical: Not on file  Tobacco Use  . Smoking status: Never Smoker  . Smokeless tobacco: Never Used  Substance and Sexual Activity  . Alcohol use: No    Alcohol/week: 0.0 standard drinks  . Drug use: No  . Sexual activity: Never    Birth control/protection: None, Surgical  Lifestyle  . Physical activity:    Days per week: Not on file    Minutes per session: Not on file  . Stress: Not on file  Relationships  . Social connections:    Talks on phone: Not on file    Gets together: Not on file    Attends religious service: Not on file    Active member of club or organization: Not on file    Attends meetings of clubs or organizations: Not on file    Relationship status: Not on file  Other Topics Concern  . Not on file  Social History Narrative   Occasionally drinks Peps[employer]        Review of Systems: As mentioned in HPI   Observations/Objective: No distress. Unable to perform physical exam due to telephone encounter. No video available.   Assessment and Plan:  73 year old female with acute onset of diarrhea a few days ago, now decreasing in frequency and severity. Flare of hemorrhoids clinically; I am unable to do physical exam due to virtual visit. As she is improving symptomatically, we will hold off on any stool studies. If worsening, she is to call. In interim, start Anusol cream BID for next 7 days. She may benefit from hemorrhoid banding in the future if persistent symptoms. Return in 6 weeks.  As of note, she is also requesting referral to a PCP in [place]. We will provide a list for her.   Follow Up Instructions:    I discussed the assessment and treatment plan with the patient. The patient was provided an opportunity to ask questions and all were answered. The patient agreed with the plan and demonstrated an  understanding of the instructions.   The patient was advised to call back or seek an in-person evaluation if the symptoms worsen or if the condition fails to improve as anticipated.  I provided  15 minutes of non-face-to-face time during this encounter.  Annitta Needs,  PhD, ANP-BC [facility] Gastroenterology

## 2019-02-08 ENCOUNTER — Encounter: Payer: Self-pay | Admitting: Internal Medicine

## 2019-02-08 NOTE — Progress Notes (Signed)
CC'D TO PCP °

## 2019-02-13 ENCOUNTER — Encounter (INDEPENDENT_AMBULATORY_CARE_PROVIDER_SITE_OTHER): Payer: Medicare Other | Admitting: Ophthalmology

## 2019-02-13 ENCOUNTER — Other Ambulatory Visit: Payer: Self-pay

## 2019-02-13 DIAGNOSIS — H2703 Aphakia, bilateral: Secondary | ICD-10-CM | POA: Diagnosis not present

## 2019-02-13 DIAGNOSIS — H353132 Nonexudative age-related macular degeneration, bilateral, intermediate dry stage: Secondary | ICD-10-CM

## 2019-02-22 ENCOUNTER — Ambulatory Visit: Payer: Medicare Other | Admitting: Gastroenterology

## 2019-03-06 DIAGNOSIS — I10 Essential (primary) hypertension: Secondary | ICD-10-CM | POA: Diagnosis not present

## 2019-03-06 DIAGNOSIS — M545 Low back pain: Secondary | ICD-10-CM | POA: Diagnosis not present

## 2019-03-06 DIAGNOSIS — K210 Gastro-esophageal reflux disease with esophagitis: Secondary | ICD-10-CM | POA: Diagnosis not present

## 2019-03-06 DIAGNOSIS — M109 Gout, unspecified: Secondary | ICD-10-CM | POA: Diagnosis not present

## 2019-03-06 DIAGNOSIS — J449 Chronic obstructive pulmonary disease, unspecified: Secondary | ICD-10-CM | POA: Diagnosis not present

## 2019-03-22 ENCOUNTER — Encounter: Payer: Self-pay | Admitting: Orthopaedic Surgery

## 2019-03-22 ENCOUNTER — Ambulatory Visit: Payer: Self-pay

## 2019-03-22 ENCOUNTER — Ambulatory Visit (INDEPENDENT_AMBULATORY_CARE_PROVIDER_SITE_OTHER): Payer: Medicare Other | Admitting: Orthopaedic Surgery

## 2019-03-22 ENCOUNTER — Other Ambulatory Visit: Payer: Self-pay

## 2019-03-22 VITALS — BP 126/64 | HR 91 | Ht 63.0 in | Wt 211.0 lb

## 2019-03-22 DIAGNOSIS — M25552 Pain in left hip: Secondary | ICD-10-CM | POA: Diagnosis not present

## 2019-03-22 DIAGNOSIS — M5441 Lumbago with sciatica, right side: Secondary | ICD-10-CM

## 2019-03-22 DIAGNOSIS — M25551 Pain in right hip: Secondary | ICD-10-CM | POA: Diagnosis not present

## 2019-03-22 DIAGNOSIS — M5442 Lumbago with sciatica, left side: Secondary | ICD-10-CM

## 2019-03-22 DIAGNOSIS — G8929 Other chronic pain: Secondary | ICD-10-CM

## 2019-03-22 MED ORDER — METHYLPREDNISOLONE ACETATE 40 MG/ML IJ SUSP
40.0000 mg | INTRAMUSCULAR | Status: AC | PRN
  Administered 2019-03-22: 40 mg via INTRA_ARTICULAR

## 2019-03-22 MED ORDER — LIDOCAINE HCL 1 % IJ SOLN
0.5000 mL | INTRAMUSCULAR | Status: AC | PRN
Start: 2019-03-22 — End: 2019-03-22
  Administered 2019-03-22: .5 mL

## 2019-03-22 MED ORDER — BUPIVACAINE HCL 0.25 % IJ SOLN
2.0000 mL | INTRAMUSCULAR | Status: AC | PRN
  Administered 2019-03-22: 2 mL via INTRA_ARTICULAR
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
## 2019-03-29 ENCOUNTER — Encounter: Payer: Self-pay | Admitting: Orthopaedic Surgery

## 2019-03-29 ENCOUNTER — Ambulatory Visit (INDEPENDENT_AMBULATORY_CARE_PROVIDER_SITE_OTHER): Payer: Medicare Other | Admitting: Orthopaedic Surgery

## 2019-03-29 ENCOUNTER — Other Ambulatory Visit: Payer: Self-pay

## 2019-03-29 DIAGNOSIS — M7062 Trochanteric bursitis, left hip: Secondary | ICD-10-CM

## 2019-03-29 MED ORDER — LIDOCAINE HCL 1 % IJ SOLN
0.5000 mL | INTRAMUSCULAR | Status: AC | PRN
  Administered 2019-03-29: .5 mL

## 2019-03-29 MED ORDER — METHYLPREDNISOLONE ACETATE 40 MG/ML IJ SUSP
40.0000 mg | INTRAMUSCULAR | Status: AC | PRN
  Administered 2019-03-29: 40 mg via INTRA_ARTICULAR

## 2019-03-29 MED ORDER — BUPIVACAINE HCL 0.25 % IJ SOLN
2.0000 mL | INTRAMUSCULAR | Status: AC | PRN
Start: 2019-03-29 — End: 2019-03-29
  Administered 2019-03-29: 2 mL via INTRA_ARTICULAR
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
## 2019-04-11 DIAGNOSIS — J449 Chronic obstructive pulmonary disease, unspecified: Secondary | ICD-10-CM | POA: Diagnosis not present

## 2019-04-11 DIAGNOSIS — Z0000 Encounter for general adult medical examination without abnormal findings: Secondary | ICD-10-CM | POA: Diagnosis not present

## 2019-04-11 DIAGNOSIS — M109 Gout, unspecified: Secondary | ICD-10-CM | POA: Diagnosis not present

## 2019-04-11 DIAGNOSIS — K210 Gastro-esophageal reflux disease with esophagitis: Secondary | ICD-10-CM | POA: Diagnosis not present

## 2019-04-11 DIAGNOSIS — I10 Essential (primary) hypertension: Secondary | ICD-10-CM | POA: Diagnosis not present

## 2019-04-11 DIAGNOSIS — M545 Low back pain: Secondary | ICD-10-CM | POA: Diagnosis not present

## 2019-04-13 DIAGNOSIS — I10 Essential (primary) hypertension: Secondary | ICD-10-CM | POA: Diagnosis not present

## 2019-04-13 DIAGNOSIS — J449 Chronic obstructive pulmonary disease, unspecified: Secondary | ICD-10-CM | POA: Diagnosis not present

## 2019-04-13 DIAGNOSIS — E785 Hyperlipidemia, unspecified: Secondary | ICD-10-CM | POA: Diagnosis not present

## 2019-04-13 DIAGNOSIS — M545 Low back pain: Secondary | ICD-10-CM | POA: Diagnosis not present

## 2019-04-13 DIAGNOSIS — Z882 Allergy status to sulfonamides status: Secondary | ICD-10-CM | POA: Diagnosis not present

## 2019-04-14 DIAGNOSIS — M549 Dorsalgia, unspecified: Secondary | ICD-10-CM | POA: Diagnosis not present

## 2019-04-18 ENCOUNTER — Ambulatory Visit: Payer: Medicare Other | Admitting: Gastroenterology

## 2019-04-24 ENCOUNTER — Telehealth: Payer: Self-pay | Admitting: Orthopaedic Surgery

## 2019-04-24 NOTE — Telephone Encounter (Signed)
Sorry. Discussed with her at last visit. On Lorazepam and not able to put her on narcotic meds or muscle relaxant .

## 2019-04-24 NOTE — Telephone Encounter (Signed)
Pt came into the office said she's been having a lot of lower back pain and is requesting something for pain or muscle relaxer.   [phone]

## 2019-04-24 NOTE — Telephone Encounter (Signed)
Please advise. Thanks.  

## 2019-04-24 NOTE — Telephone Encounter (Signed)
IC LM advising per Dr Lorin Mercy

## 2019-05-02 DIAGNOSIS — M9905 Segmental and somatic dysfunction of pelvic region: Secondary | ICD-10-CM | POA: Diagnosis not present

## 2019-05-02 DIAGNOSIS — M9903 Segmental and somatic dysfunction of lumbar region: Secondary | ICD-10-CM | POA: Diagnosis not present

## 2019-05-02 DIAGNOSIS — M5136 Other intervertebral disc degeneration, lumbar region: Secondary | ICD-10-CM | POA: Diagnosis not present

## 2019-05-02 DIAGNOSIS — M5417 Radiculopathy, lumbosacral region: Secondary | ICD-10-CM | POA: Diagnosis not present

## 2019-05-03 ENCOUNTER — Telehealth: Payer: Self-pay | Admitting: *Deleted

## 2019-05-03 NOTE — Telephone Encounter (Signed)
Patient verbally consented for telehealth visits with [facility] and understands that her insurance company will be billed for the encounter.   Unable to check vitals

## 2019-05-08 DIAGNOSIS — Z882 Allergy status to sulfonamides status: Secondary | ICD-10-CM | POA: Diagnosis not present

## 2019-05-08 DIAGNOSIS — J449 Chronic obstructive pulmonary disease, unspecified: Secondary | ICD-10-CM | POA: Diagnosis not present

## 2019-05-08 DIAGNOSIS — M545 Low back pain: Secondary | ICD-10-CM | POA: Diagnosis not present

## 2019-05-08 DIAGNOSIS — R079 Chest pain, unspecified: Secondary | ICD-10-CM | POA: Diagnosis not present

## 2019-05-08 DIAGNOSIS — I10 Essential (primary) hypertension: Secondary | ICD-10-CM | POA: Diagnosis not present

## 2019-05-08 DIAGNOSIS — R0989 Other specified symptoms and signs involving the circulatory and respiratory systems: Secondary | ICD-10-CM | POA: Diagnosis not present

## 2019-05-08 DIAGNOSIS — H81399 Other peripheral vertigo, unspecified ear: Secondary | ICD-10-CM | POA: Diagnosis not present

## 2019-05-08 DIAGNOSIS — E785 Hyperlipidemia, unspecified: Secondary | ICD-10-CM | POA: Diagnosis not present

## 2019-05-08 DIAGNOSIS — R0789 Other chest pain: Secondary | ICD-10-CM | POA: Diagnosis not present

## 2019-05-08 DIAGNOSIS — R600 Localized edema: Secondary | ICD-10-CM | POA: Diagnosis not present

## 2019-05-08 DIAGNOSIS — R42 Dizziness and giddiness: Secondary | ICD-10-CM | POA: Diagnosis not present

## 2019-05-08 DIAGNOSIS — N390 Urinary tract infection, site not specified: Secondary | ICD-10-CM | POA: Diagnosis not present

## 2019-05-09 ENCOUNTER — Ambulatory Visit: Payer: Medicare Other | Admitting: Cardiology

## 2019-05-11 DIAGNOSIS — Z79899 Other long term (current) drug therapy: Secondary | ICD-10-CM | POA: Diagnosis not present

## 2019-05-11 DIAGNOSIS — I517 Cardiomegaly: Secondary | ICD-10-CM | POA: Diagnosis not present

## 2019-05-11 DIAGNOSIS — Z882 Allergy status to sulfonamides status: Secondary | ICD-10-CM | POA: Diagnosis not present

## 2019-05-11 DIAGNOSIS — I10 Essential (primary) hypertension: Secondary | ICD-10-CM | POA: Diagnosis not present

## 2019-05-11 DIAGNOSIS — J449 Chronic obstructive pulmonary disease, unspecified: Secondary | ICD-10-CM | POA: Diagnosis not present

## 2019-05-11 DIAGNOSIS — R1030 Lower abdominal pain, unspecified: Secondary | ICD-10-CM | POA: Diagnosis not present

## 2019-05-11 DIAGNOSIS — K5730 Diverticulosis of large intestine without perforation or abscess without bleeding: Secondary | ICD-10-CM | POA: Diagnosis not present

## 2019-05-11 DIAGNOSIS — I700 Atherosclerosis of aorta: Secondary | ICD-10-CM | POA: Diagnosis not present

## 2019-05-16 DIAGNOSIS — K580 Irritable bowel syndrome with diarrhea: Secondary | ICD-10-CM | POA: Diagnosis not present

## 2019-06-05 DIAGNOSIS — M7061 Trochanteric bursitis, right hip: Secondary | ICD-10-CM | POA: Diagnosis not present

## 2019-06-05 DIAGNOSIS — M7062 Trochanteric bursitis, left hip: Secondary | ICD-10-CM | POA: Diagnosis not present

## 2019-06-06 ENCOUNTER — Other Ambulatory Visit: Payer: Self-pay | Admitting: Cardiology

## 2019-06-12 DIAGNOSIS — R0789 Other chest pain: Secondary | ICD-10-CM | POA: Diagnosis not present

## 2019-06-12 DIAGNOSIS — K219 Gastro-esophageal reflux disease without esophagitis: Secondary | ICD-10-CM | POA: Diagnosis not present

## 2019-06-12 DIAGNOSIS — E785 Hyperlipidemia, unspecified: Secondary | ICD-10-CM | POA: Diagnosis not present

## 2019-06-12 DIAGNOSIS — J449 Chronic obstructive pulmonary disease, unspecified: Secondary | ICD-10-CM | POA: Diagnosis not present

## 2019-06-12 DIAGNOSIS — R51 Headache: Secondary | ICD-10-CM | POA: Diagnosis not present

## 2019-06-12 DIAGNOSIS — Z882 Allergy status to sulfonamides status: Secondary | ICD-10-CM | POA: Diagnosis not present

## 2019-06-12 DIAGNOSIS — Z1159 Encounter for screening for other viral diseases: Secondary | ICD-10-CM | POA: Diagnosis not present

## 2019-06-12 DIAGNOSIS — R29818 Other symptoms and signs involving the nervous system: Secondary | ICD-10-CM | POA: Diagnosis not present

## 2019-06-12 DIAGNOSIS — Z01810 Encounter for preprocedural cardiovascular examination: Secondary | ICD-10-CM | POA: Diagnosis not present

## 2019-06-12 DIAGNOSIS — Z79899 Other long term (current) drug therapy: Secondary | ICD-10-CM | POA: Diagnosis not present

## 2019-06-12 DIAGNOSIS — R42 Dizziness and giddiness: Secondary | ICD-10-CM | POA: Diagnosis not present

## 2019-06-12 DIAGNOSIS — R079 Chest pain, unspecified: Secondary | ICD-10-CM | POA: Diagnosis not present

## 2019-06-12 DIAGNOSIS — Z79891 Long term (current) use of opiate analgesic: Secondary | ICD-10-CM | POA: Diagnosis not present

## 2019-06-12 DIAGNOSIS — I10 Essential (primary) hypertension: Secondary | ICD-10-CM | POA: Diagnosis not present

## 2019-06-12 DIAGNOSIS — E860 Dehydration: Secondary | ICD-10-CM | POA: Diagnosis not present

## 2019-06-13 DIAGNOSIS — R0789 Other chest pain: Secondary | ICD-10-CM | POA: Diagnosis not present

## 2019-06-13 DIAGNOSIS — R079 Chest pain, unspecified: Secondary | ICD-10-CM | POA: Diagnosis not present

## 2019-06-15 ENCOUNTER — Ambulatory Visit: Payer: Medicare Other | Admitting: Cardiology

## 2019-06-20 DIAGNOSIS — I951 Orthostatic hypotension: Secondary | ICD-10-CM | POA: Diagnosis not present

## 2019-06-21 DIAGNOSIS — Z8781 Personal history of (healed) traumatic fracture: Secondary | ICD-10-CM | POA: Diagnosis not present

## 2019-06-21 DIAGNOSIS — M5126 Other intervertebral disc displacement, lumbar region: Secondary | ICD-10-CM | POA: Diagnosis not present

## 2019-06-27 ENCOUNTER — Other Ambulatory Visit: Payer: Self-pay

## 2019-06-27 DIAGNOSIS — Z20822 Contact with and (suspected) exposure to covid-19: Secondary | ICD-10-CM

## 2019-06-27 DIAGNOSIS — M25551 Pain in right hip: Secondary | ICD-10-CM | POA: Diagnosis not present

## 2019-06-27 DIAGNOSIS — M545 Low back pain: Secondary | ICD-10-CM | POA: Diagnosis not present

## 2019-06-27 DIAGNOSIS — M25552 Pain in left hip: Secondary | ICD-10-CM | POA: Diagnosis not present

## 2019-06-28 ENCOUNTER — Telehealth: Payer: Self-pay | Admitting: Internal Medicine

## 2019-06-28 LAB — NOVEL CORONAVIRUS, NAA: SARS-CoV-2, NAA: NOT DETECTED

## 2019-06-28 NOTE — Telephone Encounter (Signed)
Pt has questions about her bowels and wanted to speak with AB. [phone] or [phone]

## 2019-06-28 NOTE — Telephone Encounter (Signed)
AB, I returned pt's call. Pt refused to talk to me. I explained that I could give AB the information needed and pt stated again she wants to talk with AB only.

## 2019-06-29 NOTE — Telephone Encounter (Signed)
BM every day. Smearing in underwear. I have asked her to add supplemental fiber. Needs office visit for further evaluation. Query internal hemorrhoids as culprit for fecal seepage/soiling.  Erline Levine, please arrange appt in next 2-4 weeks.

## 2019-07-02 ENCOUNTER — Encounter: Payer: Self-pay | Admitting: Internal Medicine

## 2019-07-02 NOTE — Telephone Encounter (Signed)
PATIENT SCHEDULED AND CALLED WITH APPOINTMENT

## 2019-07-10 ENCOUNTER — Institutional Professional Consult (permissible substitution): Payer: Medicare Other | Admitting: Neurology

## 2019-07-13 ENCOUNTER — Ambulatory Visit: Payer: Medicare Other | Admitting: Gastroenterology

## 2019-07-16 ENCOUNTER — Ambulatory Visit: Payer: Medicare Other | Admitting: Cardiology

## 2019-07-16 ENCOUNTER — Encounter: Payer: Self-pay | Admitting: Cardiology

## 2019-07-16 NOTE — Progress Notes (Deleted)
Cardiology Office Note    Date:  07/16/2019   ID:  Michelle Zavala, DOB 05/15/1946, MRN [id number]  PCP:  Scotty Court, DO  Cardiologist: Rozann Lesches, MD    No chief complaint on file.   History of Present Illness:    Michelle Zavala is a 73 y.o. female ***    Past Medical History:  Diagnosis Date  . Anxiety   . Arthritis   . C2 cervical fracture ([place]) 09/17/13   Traumatic fracture witth minimal displacement  . Chronic lung disease    Fibrosis - Dr. Koleen Nimrod  . COPD (chronic obstructive pulmonary disease) ([place])   . Coronary atherosclerosis of native coronary artery    Mild atherosclerosis 3/10, LVEF 60-65%  . Depression   . Diverticulosis   . Essential hypertension, benign   . GERD (gastroesophageal reflux disease)   . PSVT (paroxysmal supraventricular tachycardia) ([place])     Past Surgical History:  Procedure Laterality Date  . ABDOMINAL HYSTERECTOMY    . ANTERIOR RELEASE VERTEBRAL BODY W/ POSTERIOR FUSION    . BACTERIAL OVERGROWTH TEST N/A 02/19/2016   Procedure: BACTERIAL OVERGROWTH TEST;  Surgeon: Daneil Dolin, MD;  [facility];  Service: Endoscopy;  Laterality: N/A;  0700  . BIOPSY N/A 08/04/2015   Procedure: BIOPSY;  Surgeon: Daneil Dolin, MD;  [facility];  Service: Endoscopy;  Laterality: N/A;  Gastric  . BIOPSY  02/21/2017   Procedure: BIOPSY;  Surgeon: Daneil Dolin, MD;  [facility];  Service: Endoscopy;;  ascending colon biopsy   . cataract surgery    . CESAREAN SECTION    . CHOLECYSTECTOMY  1973  . COLONOSCOPY  June 2016   Dr. Britta Mccreedy: moderate diverticulosis in sigmoid colon, surveillance in 5 years   . COLONOSCOPY WITH PROPOFOL N/A 02/21/2017   Dr. Gala Romney: diverticulosis in sigmoid colon, tubular adenomas, segmental biopsies benign. Surveillance in 5 years   . ESOPHAGOGASTRODUODENOSCOPY (EGD) WITH PROPOFOL N/A 08/04/2015   Dr. Rourk:abnormal gastric mucosa s/p biopsy. Reactive gastropathy. Negative H.pylori  .  ESOPHAGOGASTRODUODENOSCOPY (EGD) WITH PROPOFOL N/A 02/21/2017   Dr. Gala Romney: empiric esophageal dilatation, small hiatal hernia, otherwise normal  . INCISIONAL HERNIA REPAIR N/A 12/03/2015   Procedure: [name] HERNIORRHAPHY WITH MESH;  Surgeon: Aviva Signs, MD;  [facility];  Service: General;  Laterality: N/A;  . INSERTION OF MESH  12/03/2015   Procedure: INSERTION OF MESH;  Surgeon: Aviva Signs, MD;  [facility];  Service: General;;  . IR RADIOLOGIST EVAL & MGMT  09/22/2017  . MALONEY DILATION N/A 02/21/2017   Procedure: [name] DILATION;  Surgeon: Daneil Dolin, MD;  [facility];  Service: Endoscopy;  Laterality: N/A;  . POLYPECTOMY  02/21/2017   Procedure: POLYPECTOMY;  Surgeon: Daneil Dolin, MD;  [facility];  Service: Endoscopy;;  hepatic flexure polyp cs  . TMJ ARTHROSCOPY Bilateral 02/04/2015   Procedure: BILATERAL TEMPOROMANDIBULAR JOINT (TMJ) ARTHROSCOPY MENISECTOMY WITH FAT GRAFT FROM ABDOMEN ;  Surgeon: Diona Browner, DDS;  Location: [place];  Service: Oral Surgery;  Laterality: Bilateral;  . TOTAL KNEE ARTHROPLASTY Bilateral   . TUBAL LIGATION      Current Medications: Outpatient Medications Prior to Visit  Medication Sig Dispense Refill  . acetaminophen (TYLENOL) 650 MG CR tablet Take 1,300 mg by mouth at bedtime as needed for pain.     [name] albuterol (PROAIR HFA) 108 (90 Base) MCG/ACT inhaler Inhale 2 puffs into the lungs every 4 (four) hours as  needed for wheezing or shortness of breath.    [name] amitriptyline (ELAVIL) 75 MG tablet Take 75 mg by mouth at bedtime.  6  . Biotin w/ Vitamins C & E (HAIR/SKIN/NAILS PO) Take 1 tablet by mouth 2 (two) times daily.    . fluticasone (FLONASE) 50 MCG/ACT nasal spray Place 2 sprays into both nostrils daily as needed for allergies or rhinitis.    . hydrochlorothiazide (HYDRODIURIL) 25 MG tablet Take 25 mg by mouth every morning.    . hydrocortisone (ANUSOL-HC) 2.5 % rectal cream Place 1 application rectally 2  (two) times daily. 30 g 1  . ipratropium-albuterol (DUONEB) 0.5-2.5 (3) MG/3ML SOLN Take 3 mLs by nebulization every 4 (four) hours as needed.    [name] LORazepam (ATIVAN) 1 MG tablet Take 1 mg by mouth 2 (two) times daily.    [name] LORazepam (ATIVAN) 1 MG tablet     . metoprolol succinate (TOPROL-XL) 50 MG 24 hr tablet TAKE ONE TABLET BY MOUTH TWICE DAILY. 180 tablet 1  . Multiple Vitamins-Minerals (PRESERVISION AREDS PO) Take 1 tablet by mouth daily.    . Omega-3 Fatty Acids (FISH OIL) 1000 MG CAPS Take 1 capsule by mouth daily.    . pantoprazole (PROTONIX) 40 MG tablet Take 1 tablet (40 mg total) by mouth 2 (two) times daily. 60 tablet 1  . sertraline (ZOLOFT) 100 MG tablet Take 100 mg by mouth daily.     . vitamin B-12 (CYANOCOBALAMIN) 100 MCG tablet Take 100 mcg by mouth daily.    . Vitamin D, Ergocalciferol, (DRISDOL) 50000 units CAPS capsule Take 50,000 Units by mouth every 7 (seven) days. Saturday     No facility-administered medications prior to visit.      Allergies:   Sulfonamide derivatives   Social History   Socioeconomic History  . Marital status: Widowed    Spouse name: Not on file  . Number of children: 3  . Years of education: 7  . Highest education level: Not on file  Occupational History  . Occupation: Retired  Scientific laboratory technician  . Financial resource strain: Not on file  . Food insecurity    Worry: Not on file    Inability: Not on file  . Transportation needs    Medical: Not on file    Non-medical: Not on file  Tobacco Use  . Smoking status: Never Smoker  . Smokeless tobacco: Never Used  Substance and Sexual Activity  . Alcohol use: No    Alcohol/week: 0.0 standard drinks  . Drug use: No  . Sexual activity: Never    Birth control/protection: None, Surgical  Lifestyle  . Physical activity    Days per week: Not on file    Minutes per session: Not on file  . Stress: Not on file  Relationships  . Social Herbalist on phone: Not on file    Gets together:  Not on file    Attends religious service: Not on file    Active member of club or organization: Not on file    Attends meetings of clubs or organizations: Not on file    Relationship status: Not on file  Other Topics Concern  . Not on file  Social History Narrative   Occasionally drinks Pepsi      Family History:  The patient's ***family history includes Coronary artery disease in her brother, father, and sister; Heart disease in her father.   Review of Systems:   Please see the history of present illness.  General:  No chills, fever, night sweats or weight changes.  Cardiovascular:  No chest pain, dyspnea on exertion, edema, orthopnea, palpitations, paroxysmal nocturnal dyspnea. Dermatological: No rash, lesions/masses Respiratory: No cough, dyspnea Urologic: No hematuria, dysuria Abdominal:   No nausea, vomiting, diarrhea, bright red blood per rectum, melena, or hematemesis Neurologic:  No visual changes, wkns, changes in mental status. All other systems reviewed and are otherwise negative except as noted above.   Physical Exam:    VS:  There were no vitals taken for this visit.   General: Well developed, well nourished,female appearing in no acute distress. Head: Normocephalic, atraumatic, sclera non-icteric, no xanthomas, nares are without discharge.  Neck: No carotid bruits. JVD not elevated.  Lungs: Respirations regular and unlabored, without wheezes or rales.  Heart: ***Regular rate and rhythm. No S3 or S4.  No murmur, no rubs, or gallops appreciated. Abdomen: Soft, non-tender, non-distended with normoactive bowel sounds. No hepatomegaly. No rebound/guarding. No obvious abdominal masses. Msk:  Strength and tone appear normal for age. No joint deformities or effusions. Extremities: No clubbing or cyanosis. No edema.  Distal pedal pulses are 2+ bilaterally. Neuro: Alert and oriented X 3. Moves all extremities spontaneously. No focal deficits noted. Psych:  Responds to  questions appropriately with a normal affect. Skin: No rashes or lesions noted  Wt Readings from Last 3 Encounters:  03/29/19 211 lb (95.7 kg)  03/22/19 211 lb (95.7 kg)  11/03/18 221 lb 6.4 oz (100.4 kg)        Studies/Labs Reviewed:   EKG:  EKG is*** ordered today.  The ekg ordered today demonstrates ***  Recent Labs: No results found for requested labs within last 8760 hours.   Lipid Panel No results found for: CHOL, TRIG, HDL, CHOLHDL, VLDL, LDLCALC, LDLDIRECT  Additional studies/ records that were reviewed today include:  ***  Assessment:    No diagnosis found.   Plan:   In order of problems listed above:  1. ***    Medication Adjustments/Labs and Tests Ordered: Current medicines are reviewed at length with the patient today.  Concerns regarding medicines are outlined above.  Medication changes, Labs and Tests ordered today are listed in the Patient Instructions below. There are no Patient Instructions on file for this visit.   Signed, Verta Ellen, NP  07/16/2019 10:33 AM    [address], [place],  [address] Phone: [phone] Fax: [phone]

## 2019-07-19 ENCOUNTER — Other Ambulatory Visit: Payer: Self-pay

## 2019-07-19 ENCOUNTER — Encounter: Payer: Self-pay | Admitting: Gastroenterology

## 2019-07-19 ENCOUNTER — Ambulatory Visit (INDEPENDENT_AMBULATORY_CARE_PROVIDER_SITE_OTHER): Payer: Medicare Other | Admitting: Gastroenterology

## 2019-07-19 ENCOUNTER — Other Ambulatory Visit: Payer: Self-pay | Admitting: *Deleted

## 2019-07-19 VITALS — BP 129/75 | HR 72 | Temp 96.2°F | Ht 63.0 in | Wt 205.2 lb

## 2019-07-19 DIAGNOSIS — F4321 Adjustment disorder with depressed mood: Secondary | ICD-10-CM

## 2019-07-19 DIAGNOSIS — F32A Depression, unspecified: Secondary | ICD-10-CM

## 2019-07-19 DIAGNOSIS — F329 Major depressive disorder, single episode, unspecified: Secondary | ICD-10-CM

## 2019-07-19 DIAGNOSIS — F411 Generalized anxiety disorder: Secondary | ICD-10-CM

## 2019-07-19 DIAGNOSIS — Z634 Disappearance and death of family member: Secondary | ICD-10-CM

## 2019-07-19 DIAGNOSIS — K5900 Constipation, unspecified: Secondary | ICD-10-CM | POA: Insufficient documentation

## 2019-07-19 DIAGNOSIS — Z20822 Contact with and (suspected) exposure to covid-19: Secondary | ICD-10-CM

## 2019-07-19 NOTE — Patient Instructions (Signed)
Great news that you are doing better!  Continue the fiber daily.  Call if any rectal bleeding.  I am so sorry for your loss. We will get you plugged in with psychiatry.  I will see you in 6 months!  I enjoyed seeing you again today! As you know, I value our relationship and want to provide genuine, compassionate, and quality care. I welcome your feedback. If you receive a survey regarding your visit,  I greatly appreciate you taking time to fill this out. See you next time!  Annitta Needs, PhD, ANP-BC [facility]astroenter[facility]

## 2019-07-19 NOTE — Progress Notes (Signed)
Referring Provider: Scotty Court, DO Primary Care Physician:  Scotty Court, DO  Primary GI: Dr. Gala Romney   Chief Complaint  Patient presents with   bowel change    doing better since started fiber    HPI:   Michelle Zavala is a 73 y.o. female presenting today with a history of chronic abdominal pain, diarrhea, dysphagia. 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 Progression of SMA disease could cause issues, but clinical monitoring recommended per IR. She was seen by Vascular Surgery due to renal artery disease as well. Medical therapy recommended. Symptomatic fat-containing ventral hernia noted at visit in March 2017, undergoing hernia repair in March 2017.TCS/EGD with negative segmental colonic biopsies, needing surveillance in 5 years due to colonic adenomas. EGD with empiric dilatation, small hiatal hernia. Celiac serologies negative. 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 Negative hydrogen breath test for small bowel bacterial overgrowth.CTA updated 08/24/17: mixed plaque at origin of SMA but not felt to be greater than 50%. Celiac and IMA remained patent. Discussed with Dr. Earleen Newport, who recommended IR outpatient clinic evaluation. She was not felt to have chronic mesenteric ischemia.   Today following up after last visit in May 2020. Dealing with diarrhea at that time but improved by time of visit.  Anusol BID provided in May 2020. Unable to do rectal exam at that time due to virtual visit. Called in a few weeks ago with fecal smearing. Added supplemental fiber.  Doing much better with fiber. No more fecal smearing. BM once per day. No rectal bleeding, itching, burning. Lost her son to colon cancer 2018/05/25. Daughter passed away 2019-05-26 from cancer. She is dealing with grief. Previously on Ativan four times a day, now cut down to twice. Feels she needs help with long-term medication maintenance to help treat  depression/anxiety. Willing now to be referred to psychiatry.   Past Medical History:  Diagnosis Date   Anxiety    Arthritis    C2 cervical fracture ([place]) 09/17/13   Traumatic fracture witth minimal displacement   Chronic lung disease    Fibrosis - Dr. Koleen Nimrod   COPD (chronic obstructive pulmonary disease) ([place])    Coronary atherosclerosis of native coronary artery    Mild atherosclerosis 3/10, LVEF 60-65%   Depression    Diverticulosis    Essential hypertension, benign    GERD (gastroesophageal reflux disease)    PSVT (paroxysmal supraventricular tachycardia) ([place])     Past Surgical History:  Procedure Laterality Date   ABDOMINAL HYSTERECTOMY     ANTERIOR RELEASE VERTEBRAL BODY W/ POSTERIOR FUSION     BACTERIAL OVERGROWTH TEST N/A 02/19/2016   Procedure: BACTERIAL OVERGROWTH TEST;  Surgeon: Daneil Dolin, MD;  [facility];  Service: Endoscopy;  Laterality: N/A;  0700   BIOPSY N/A 08/04/2015   Procedure: BIOPSY;  Surgeon: Daneil Dolin, MD;  [facility];  Service: Endoscopy;  Laterality: N/A;  Gastric   BIOPSY  02/21/2017   Procedure: BIOPSY;  Surgeon: Daneil Dolin, MD;  [facility];  Service: Endoscopy;;  ascending colon biopsy    cataract surgery     [place]   COLONOSCOPY  June 2016   Dr. Britta Mccreedy: moderate diverticulosis in sigmoid colon, surveillance in 5 years    COLONOSCOPY WITH PROPOFOL N/A 02/21/2017   Dr. Gala Romney: diverticulosis in sigmoid colon, tubular adenomas, segmental  biopsies benign. Surveillance in 5 years    ESOPHAGOGASTRODUODENOSCOPY (EGD) WITH PROPOFOL N/A 08/04/2015   Dr. Rourk:abnormal gastric mucosa s/p biopsy. Reactive gastropathy. Negative H.pylori   ESOPHAGOGASTRODUODENOSCOPY (EGD) WITH PROPOFOL N/A 02/21/2017   Dr. Gala Romney: empiric esophageal dilatation, small hiatal hernia, otherwise normal   INCISIONAL HERNIA REPAIR N/A 12/03/2015   Procedure: [name]  HERNIORRHAPHY WITH MESH;  Surgeon: Aviva Signs, MD;  [facility];  Service: General;  Laterality: N/A;   INSERTION OF MESH  12/03/2015   Procedure: INSERTION OF MESH;  Surgeon: Aviva Signs, MD;  [facility];  Service: General;;   IR RADIOLOGIST EVAL & MGMT  09/22/2017   MALONEY DILATION N/A 02/21/2017   Procedure: [name] DILATION;  Surgeon: Daneil Dolin, MD;  [facility];  Service: Endoscopy;  Laterality: N/A;   POLYPECTOMY  02/21/2017   Procedure: POLYPECTOMY;  Surgeon: Daneil Dolin, MD;  [facility];  Service: Endoscopy;;  hepatic flexure polyp cs   TMJ ARTHROSCOPY Bilateral 02/04/2015   Procedure: BILATERAL TEMPOROMANDIBULAR JOINT (TMJ) ARTHROSCOPY MENISECTOMY WITH FAT GRAFT FROM ABDOMEN ;  Surgeon: Diona Browner, DDS;  Location: [place];  Service: Oral Surgery;  Laterality: Bilateral;   TOTAL KNEE ARTHROPLASTY Bilateral    TUBAL LIGATION      Current Outpatient Medications  Medication Sig Dispense Refill   acetaminophen (TYLENOL) 650 MG CR tablet Take 1,300 mg by mouth at bedtime as needed for pain.      albuterol (PROAIR HFA) 108 (90 Base) MCG/ACT inhaler Inhale 2 puffs into the lungs every 4 (four) hours as needed for wheezing or shortness of breath.     amitriptyline (ELAVIL) 75 MG tablet Take 75 mg by mouth at bedtime.  6   Biotin w/ Vitamins C & E (HAIR/SKIN/NAILS PO) Take 1 tablet by mouth 2 (two) times daily.     fluticasone (FLONASE) 50 MCG/ACT nasal spray Place 2 sprays into both nostrils daily as needed for allergies or rhinitis.     hydrochlorothiazide (HYDRODIURIL) 25 MG tablet Take 25 mg by mouth every morning.     hydrocortisone (ANUSOL-HC) 2.5 % rectal cream Place 1 application rectally 2 (two) times daily. 30 g 1   ipratropium-albuterol (DUONEB) 0.5-2.5 (3) MG/3ML SOLN Take 3 mLs by nebulization every 4 (four) hours as needed.     LORazepam (ATIVAN) 1 MG tablet Take 1 mg by mouth 2 (two) times daily.     LORazepam  (ATIVAN) 1 MG tablet      Multiple Vitamins-Minerals (PRESERVISION AREDS PO) Take 1 tablet by mouth daily.     Omega-3 Fatty Acids (FISH OIL) 1000 MG CAPS Take 1 capsule by mouth 3 (three) times daily.      pantoprazole (PROTONIX) 40 MG tablet Take 1 tablet (40 mg total) by mouth 2 (two) times daily. 60 tablet 1   sertraline (ZOLOFT) 100 MG tablet Take 100 mg by mouth daily.      vitamin B-12 (CYANOCOBALAMIN) 100 MCG tablet Take 100 mcg by mouth daily.     Vitamin D, Ergocalciferol, (DRISDOL) 50000 units CAPS capsule Take 50,000 Units by mouth every 7 (seven) days. Saturday     No current facility-administered medications for this visit.     Allergies as of 07/19/2019 - Review Complete 07/19/2019  Allergen Reaction Noted   Sulfonamide derivatives Hives     Family History  Problem Relation Age of Onset   Coronary artery disease Father        Premature   Heart disease Father  Coronary artery disease Brother        Premature   Coronary artery disease Sister        Premature   Colon cancer Son     Social History   Socioeconomic History   Marital status: Widowed    Spouse name: Not on file   Number of children: 3   Years of education: 7   Highest education level: Not on file  Occupational History   Occupation: Retired  Scientist, product/process development strain: Not on file   Food insecurity    Worry: Not on file    Inability: Not on Lexicographer needs    Medical: Not on file    Non-medical: Not on file  Tobacco Use   Smoking status: Never Smoker   Smokeless tobacco: Never Used  Substance and Sexual Activity   Alcohol use: No    Alcohol/week: 0.0 standard drinks   Drug use: No   Sexual activity: Never    Birth control/protection: None, Surgical  Lifestyle   Physical activity    Days per week: Not on file    Minutes per session: Not on file   Stress: Not on file  Relationships   Social connections    Talks on phone: Not on  file    Gets together: Not on file    Attends religious service: Not on file    Active member of club or organization: Not on file    Attends meetings of clubs or organizations: Not on file    Relationship status: Not on file  Other Topics Concern   Not on file  Social History Narrative   Occasionally drinks Pepsi     Review of Systems: Gen: Denies fever, chills, anorexia. Denies fatigue, weakness, weight loss.  CV: Denies chest pain, palpitations, syncope, peripheral edema, and claudication. Resp: Denies dyspnea at rest, cough, wheezing, coughing up blood, and pleurisy. GI:see HPI Derm: Denies rash, itching, dry skin Psych: see HPI Heme: Denies bruising, bleeding, and enlarged lymph nodes.  Physical Exam: BP 129/75    Pulse 72    Temp (!) 96.2 F (35.7 C) (Temporal)    Ht 5\' 3"  (1.6 m)    Wt 205 lb 3.2 oz (93.1 kg)    BMI 36.35 kg/m  General:   Alert and oriented. No distress noted. Pleasant and cooperative.  Head:  Normocephalic and atraumatic. Abdomen:  +BS, soft, non-tender and non-distended. Obese. Possible ventral hernia RUQ. No rebound or guarding. No HSM. Msk:  Symmetrical without gross deformities. Normal posture. Extremities:  Without edema. Neurologic:  Alert and  oriented x4 Psych:  Alert and cooperative. Normal mood and affect.  ASSESSMENT: SALLIE ROSSITER is a 73 y.o. female presenting today in follow-up for chronic abdominal, recently with fecal smearing and improved with addition of fiber. No rectal bleeding or concerning signs/symptoms. Next colonoscopy in 2023 barring any clinical changes. She has lost both of her children to cancer over the past 2 years; she is grieving and desires referral to psychiatry.   PLAN:  1. Continue supplemental fiber  2. Referral to mental health  3. Return in 6 months   Annitta Needs, PhD, [facility]astroenterology

## 2019-07-21 LAB — NOVEL CORONAVIRUS, NAA: SARS-CoV-2, NAA: NOT DETECTED

## 2019-07-24 NOTE — Progress Notes (Signed)
cc'ed to pcp °

## 2019-08-13 DIAGNOSIS — J4520 Mild intermittent asthma, uncomplicated: Secondary | ICD-10-CM | POA: Diagnosis not present

## 2019-08-13 DIAGNOSIS — K5909 Other constipation: Secondary | ICD-10-CM | POA: Diagnosis not present

## 2019-08-14 DIAGNOSIS — M810 Age-related osteoporosis without current pathological fracture: Secondary | ICD-10-CM | POA: Diagnosis not present

## 2019-08-14 DIAGNOSIS — Z13820 Encounter for screening for osteoporosis: Secondary | ICD-10-CM | POA: Diagnosis not present

## 2019-09-03 DIAGNOSIS — K5900 Constipation, unspecified: Secondary | ICD-10-CM | POA: Diagnosis not present

## 2019-09-26 ENCOUNTER — Encounter: Payer: Self-pay | Admitting: *Deleted

## 2019-09-26 ENCOUNTER — Other Ambulatory Visit: Payer: Self-pay

## 2019-09-26 ENCOUNTER — Telehealth: Payer: Self-pay | Admitting: *Deleted

## 2019-09-26 ENCOUNTER — Ambulatory Visit (INDEPENDENT_AMBULATORY_CARE_PROVIDER_SITE_OTHER): Payer: Medicare Other | Admitting: Gastroenterology

## 2019-09-26 VITALS — BP 131/89 | HR 91 | Temp 96.6°F | Ht 63.0 in | Wt 206.8 lb

## 2019-09-26 DIAGNOSIS — R1032 Left lower quadrant pain: Secondary | ICD-10-CM

## 2019-09-26 MED ORDER — METRONIDAZOLE 500 MG PO TABS: 500.0000 mg | ORAL_TABLET | Freq: Three times a day (TID) | ORAL | 0 refills | Status: AC

## 2019-09-26 MED ORDER — CIPROFLOXACIN HCL 500 MG PO TABS: 500.0000 mg | ORAL_TABLET | Freq: Two times a day (BID) | ORAL | 0 refills | Status: AC

## 2019-09-26 NOTE — Telephone Encounter (Signed)
Checked PA for CT A/P via [facility] website and no PA is required

## 2019-09-26 NOTE — Patient Instructions (Addendum)
I have sent in 2 antibiotics to take for 7 days. This can cause nausea. This is to treat possible diverticulitis.   Please have CT scan done.  I recommend Benefiber daily instead of as needed.  Further recommendations to follow!   Please call [phone] to make an appointment with mental health.  I enjoyed seeing you again today! As you know, I value our relationship and want to provide genuine, compassionate, and quality care. I welcome your feedback. If you receive a survey regarding your visit,  I greatly appreciate you taking time to fill this out. See you next time!  Annitta Needs, PhD, ANP-BC [facility] Gastroenterology

## 2019-09-26 NOTE — Progress Notes (Signed)
Referring Provider: Scotty Court, DO Primary Care Physician:  Scotty Court, DO Primary GI: Dr. Gala Romney   Chief Complaint  Patient presents with  . Constipation    stool hard at first then turns to liquid  . Abdominal Pain    HPI:   Michelle Zavala is a 74 y.o. female presenting today with a history of chronic abdominal pain,diarrhea, dysphagia. 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 Progression of SMA disease could cause issues, but clinical monitoring recommended per IR. She was seen by Vascular Surgery due to renal artery disease as well. Medical therapy recommended. Symptomatic fat-containing ventral hernia noted at visit in March 2017, undergoing hernia repair in March 2017.TCS/EGD with negative segmental colonic biopsies, needing surveillance in 5 years due to colonic adenomas. EGD with empiric dilatation, small hiatal hernia. Celiac serologies negative. 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 Negative hydrogen breath test for small bowel bacterial overgrowth.CTA updated 08/24/17: mixed plaque at origin of SMA but not felt to be greater than 50%. Celiac and IMA remained patent. Discussed with Dr. Earleen Newport, who recommended IR outpatient clinic evaluation. She was not felt to have chronic mesenteric ischemia.    2-3 weeks abdominal pain in LLQ, radiates across abdomen. Always present. Drank some Mylanta today, which helped to ease some. Last week was dealing with constipation. Been taking benefiber, which helped last week. Took Benefiber yesterday because nothing since Sunday. Took a dose and cut her loose last night. Felt some better after the BMs. Was taking Benefiber as needed. Stomach hurting her now as well. No BM today but did have one last night. Associated nausea. Felt dizzy but taking "dizzy pills". No fever that she knows but has had chills.   Past Medical History:  Diagnosis Date  . Anxiety   .  Arthritis   . C2 cervical fracture ([place]) 09/17/13   Traumatic fracture witth minimal displacement  . Chronic lung disease    Fibrosis - Dr. Koleen Nimrod  . COPD (chronic obstructive pulmonary disease) ([place])   . Coronary atherosclerosis of native coronary artery    Mild atherosclerosis 3/10, LVEF 60-65%  . Depression   . Diverticulosis   . Essential hypertension, benign   . GERD (gastroesophageal reflux disease)   . PSVT (paroxysmal supraventricular tachycardia) ([place])     Past Surgical History:  Procedure Laterality Date  . ABDOMINAL HYSTERECTOMY    . ANTERIOR RELEASE VERTEBRAL BODY W/ POSTERIOR FUSION    . BACTERIAL OVERGROWTH TEST N/A 02/19/2016   Procedure: BACTERIAL OVERGROWTH TEST;  Surgeon: Daneil Dolin, MD;  [facility];  Service: Endoscopy;  Laterality: N/A;  0700  . BIOPSY N/A 08/04/2015   Procedure: BIOPSY;  Surgeon: Daneil Dolin, MD;  [facility];  Service: Endoscopy;  Laterality: N/A;  Gastric  . BIOPSY  02/21/2017   Procedure: BIOPSY;  Surgeon: Daneil Dolin, MD;  [facility];  Service: Endoscopy;;  ascending colon biopsy   . cataract surgery    . CESAREAN SECTION    . CHOLECYSTECTOMY  1973  . COLONOSCOPY  June 2016   Dr. Britta Mccreedy: moderate diverticulosis in sigmoid colon, surveillance in 5 years   . COLONOSCOPY WITH PROPOFOL N/A 02/21/2017   Dr. Gala Romney: diverticulosis in sigmoid colon, tubular adenomas, segmental biopsies benign. Surveillance in 5 years   . ESOPHAGOGASTRODUODENOSCOPY (EGD) WITH PROPOFOL N/A 08/04/2015   Dr. Rourk:abnormal gastric mucosa s/p biopsy. Reactive gastropathy. Negative  H.pylori  . ESOPHAGOGASTRODUODENOSCOPY (EGD) WITH PROPOFOL N/A 02/21/2017   Dr. Gala Romney: empiric esophageal dilatation, small hiatal hernia, otherwise normal  . INCISIONAL HERNIA REPAIR N/A 12/03/2015   Procedure: [name] HERNIORRHAPHY WITH MESH;  Surgeon: Aviva Signs, MD;  [facility];  Service: General;  Laterality: N/A;  . INSERTION OF MESH   12/03/2015   Procedure: INSERTION OF MESH;  Surgeon: Aviva Signs, MD;  [facility];  Service: General;;  . IR RADIOLOGIST EVAL & MGMT  09/22/2017  . MALONEY DILATION N/A 02/21/2017   Procedure: [name] DILATION;  Surgeon: Daneil Dolin, MD;  [facility];  Service: Endoscopy;  Laterality: N/A;  . POLYPECTOMY  02/21/2017   Procedure: POLYPECTOMY;  Surgeon: Daneil Dolin, MD;  [facility];  Service: Endoscopy;;  hepatic flexure polyp cs  . TMJ ARTHROSCOPY Bilateral 02/04/2015   Procedure: BILATERAL TEMPOROMANDIBULAR JOINT (TMJ) ARTHROSCOPY MENISECTOMY WITH FAT GRAFT FROM ABDOMEN ;  Surgeon: Diona Browner, DDS;  Location: [place];  Service: Oral Surgery;  Laterality: Bilateral;  . TOTAL KNEE ARTHROPLASTY Bilateral   . TUBAL LIGATION      Current Outpatient Medications  Medication Sig Dispense Refill  . acetaminophen (TYLENOL) 650 MG CR tablet Take 1,300 mg by mouth at bedtime as needed for pain.     [name] albuterol (PROAIR HFA) 108 (90 Base) MCG/ACT inhaler Inhale 2 puffs into the lungs every 4 (four) hours as needed for wheezing or shortness of breath.    [name] amitriptyline (ELAVIL) 75 MG tablet Take 75 mg by mouth at bedtime.  6  . Biotin w/ Vitamins C & E (HAIR/SKIN/NAILS PO) Take 1 tablet by mouth daily.     . fluticasone (FLONASE) 50 MCG/ACT nasal spray Place 2 sprays into both nostrils daily as needed for allergies or rhinitis.    . hydrochlorothiazide (HYDRODIURIL) 25 MG tablet Take 25 mg by mouth every morning.    [name] ipratropium-albuterol (DUONEB) 0.5-2.5 (3) MG/3ML SOLN Take 3 mLs by nebulization every 4 (four) hours as needed.    [name] LORazepam (ATIVAN) 1 MG tablet Take 1 mg by mouth 2 (two) times daily.    [name] LORazepam (ATIVAN) 1 MG tablet     . Multiple Vitamin (MULTIVITAMIN PO) Take by mouth daily.    . Multiple Vitamins-Minerals (PRESERVISION AREDS PO) Take 1 tablet by mouth daily.    . Omega-3 Fatty Acids (FISH OIL) 1000 MG CAPS Take 1 capsule by mouth daily.     .  pantoprazole (PROTONIX) 40 MG tablet Take 1 tablet (40 mg total) by mouth 2 (two) times daily. 60 tablet 1  . potassium chloride SA (KLOR-CON) 20 MEQ tablet Take 20 mEq by mouth daily.    . sertraline (ZOLOFT) 100 MG tablet Take 100 mg by mouth daily.     . vitamin B-12 (CYANOCOBALAMIN) 100 MCG tablet Take 100 mcg by mouth daily.    . Vitamin D, Ergocalciferol, (DRISDOL) 50000 units CAPS capsule Take 50,000 Units by mouth every 7 (seven) days. Saturday    . hydrocortisone (ANUSOL-HC) 2.5 % rectal cream Place 1 application rectally 2 (two) times daily. (Patient not taking: Reported on 09/26/2019) 30 g 1   No current facility-administered medications for this visit.    Allergies as of 09/26/2019 - Review Complete 09/26/2019  Allergen Reaction Noted  . Sulfonamide derivatives Hives     Family History  Problem Relation Age of Onset  . Coronary artery disease Father        Premature  . Heart  disease Father   . Coronary artery disease Brother        Premature  . Coronary artery disease Sister        Premature  . Colon cancer Son     Social History   Socioeconomic History  . Marital status: Widowed    Spouse name: Not on file  . Number of children: 3  . Years of education: 7  . Highest education level: Not on file  Occupational History  . Occupation: Retired  Tobacco Use  . Smoking status: Never Smoker  . Smokeless tobacco: Never Used  Substance and Sexual Activity  . Alcohol use: No    Alcohol/week: 0.0 standard drinks  . Drug use: No  . Sexual activity: Never    Birth control/protection: None, Surgical  Other Topics Concern  . Not on file  Social History Narrative   Occasionally drinks [employer]eps[employer]    Social Determinants of Health   Financial Resource Strain:   . Difficulty of Paying Living Expenses: Not on file  Food Insecurity:   . Worried About Charity fundraiser in the Last Year: Not on file  . Ran Out of Food in the Last Year: Not on file  Transportation Needs:   .  Lack of Transportation (Medical): Not on file  . Lack of Transportation (Non-Medical): Not on file  Physical Activity:   . Days of Exercise per Week: Not on file  . Minutes of Exercise per Session: Not on file  Stress:   . Feeling of Stress : Not on file  Social Connections:   . Frequency of Communication with Friends and Family: Not on file  . Frequency of Social Gatherings with Friends and Family: Not on file  . Attends Religious Services: Not on file  . Active Member of Clubs or Organizations: Not on file  . Attends Archivist Meetings: Not on file  . Marital Status: Not on file    Review of Systems: Gen: see HPI CV: Denies chest pain, palpitations, syncope, peripheral edema, and claudication. Resp: Denies dyspnea at rest, cough, wheezing, coughing up blood, and pleurisy. GI: see HPI Derm: Denies rash, itching, dry skin Psych: Denies depression, anxiety, memory loss, confusion. No homicidal or suicidal ideation.  Heme: Denies bruising, bleeding, and enlarged lymph nodes.  Physical Exam: BP 131/89   Pulse 91   Temp (!) 96.6 F (35.9 C) (Temporal)   Ht 5\' 3"  (1.6 m)   Wt 206 lb 12.8 oz (93.8 kg)   BMI 36.63 kg/m  General:   Alert and oriented. No distress noted. Pleasant and cooperative.  Head:  Normocephalic and atraumatic. Eyes:  Conjuctiva clear without scleral icterus. Abdomen:  +BS, soft, TTP LLQ and non-distended. No rebound or guarding. No HSM or masses noted. Msk:  Symmetrical without gross deformities. Normal posture. Extremities:  Without edema. Neurologic:  Alert and  oriented x4 Psych:  Alert and cooperative. Normal mood and affect.  ASSESSMENT: Michelle Zavala is a 74 y.o. female presenting today with history of chronic abdominal pain, fluctuating constipation and diarrhea and now more constipation-predominant, presenting with LLQ pain and subjective chills. Known diverticulosis. Query dealing with constipation as culprit but unable to rule out  diverticulitis. Anxiety likely playing a role as well, and recommend pursuing psychiatry as previously recommended.   Will empirically start Cipro and Flagyl. CT within next 24-48 hours.    PLAN:   CT abd/pelvis with contrast  Cipro/Flagyl short course provided  Reach out to Psychiatry (patient will need  to contact as we are unable to refer per office)  Return in 4 months  Annitta Needs, PhD, [facility]

## 2019-09-27 ENCOUNTER — Ambulatory Visit (HOSPITAL_COMMUNITY)
Admission: RE | Admit: 2019-09-27 | Discharge: 2019-09-27 | Disposition: A | Discharge status: Home / Self Care | Payer: Medicare Other | Source: Ambulatory Visit | Attending: Gastroenterology | Admitting: Gastroenterology

## 2019-09-27 DIAGNOSIS — R1032 Left lower quadrant pain: Secondary | ICD-10-CM | POA: Insufficient documentation

## 2019-09-27 DIAGNOSIS — R109 Unspecified abdominal pain: Secondary | ICD-10-CM | POA: Diagnosis not present

## 2019-09-27 LAB — POCT I-STAT CREATININE: Creatinine, Ser: 1.1 mg/dL — ABNORMAL HIGH (ref 0.44–1.00)

## 2019-09-27 MED ORDER — IOHEXOL 300 MG/ML  SOLN
100.0000 mL | Freq: Once | INTRAMUSCULAR | Status: AC | PRN
  Administered 2019-09-27: 100 mL via INTRAVENOUS

## 2019-10-01 ENCOUNTER — Telehealth: Payer: Self-pay | Admitting: Internal Medicine

## 2019-10-01 NOTE — Progress Notes (Signed)
Cc'ed to pcp °

## 2019-10-01 NOTE — Telephone Encounter (Signed)
See result note.  

## 2019-10-01 NOTE — Telephone Encounter (Signed)
Pt returning call. [phone]

## 2019-10-05 ENCOUNTER — Encounter: Payer: Self-pay | Admitting: Cardiology

## 2019-10-05 ENCOUNTER — Ambulatory Visit (INDEPENDENT_AMBULATORY_CARE_PROVIDER_SITE_OTHER): Payer: Medicare Other | Admitting: Cardiology

## 2019-10-05 ENCOUNTER — Other Ambulatory Visit: Payer: Self-pay

## 2019-10-05 VITALS — BP 121/66 | HR 91 | Ht 63.0 in | Wt 210.6 lb

## 2019-10-05 DIAGNOSIS — F329 Major depressive disorder, single episode, unspecified: Secondary | ICD-10-CM

## 2019-10-05 DIAGNOSIS — I2510 Atherosclerotic heart disease of native coronary artery without angina pectoris: Secondary | ICD-10-CM

## 2019-10-05 DIAGNOSIS — I471 Supraventricular tachycardia: Secondary | ICD-10-CM

## 2019-10-05 DIAGNOSIS — I10 Essential (primary) hypertension: Secondary | ICD-10-CM | POA: Diagnosis not present

## 2019-10-05 DIAGNOSIS — F32A Depression, unspecified: Secondary | ICD-10-CM

## 2019-10-05 NOTE — Patient Instructions (Addendum)
Medication Instructions:   Your physician recommends that you continue on your current medications as directed. Please refer to the Current Medication list given to you today.  Labwork:  NONE  Testing/Procedures:  NONE  Follow-Up:  Your physician recommends that you schedule a follow-up appointment in: 1 year (office). You will receive a reminder letter in the mail in about months reminding you to call and schedule your appointment. If you don't receive this letter, please contact our office.  Any Other Special Instructions Will Be Listed Below (If Applicable).  You have been referred to [facility].  If you need a refill on your cardiac medications before your next appointment, please call your pharmacy.

## 2019-10-05 NOTE — Progress Notes (Signed)
Cardiology Office Note  Date: 10/05/2019   ID: Michelle Zavala, DOB July 07, 1946, MRN [id number]  PCP:  Neale Burly, MD  Cardiologist:  Rozann Lesches, MD Electrophysiologist:  None   Chief Complaint  Patient presents with  . Cardiac follow-up    History of Present Illness: Michelle Zavala is a 74 y.o. female last seen in February 2020.  She presents for a follow-up visit.  She does not report any angina symptoms, no significant palpitations.  She mentions that she has had lack of energy and initiative, is taking Zoloft for depression, but does not feel like it is working.  She has lost 2 of her children in the last year.  She has not seen a behavioral health specialist.  I reviewed her medications.  She continues on Toprol-XL and omega-3 supplements in addition to the medications outlined below.  I personally reviewed her ECG today which shows sinus rhythm with borderline prolonged PR interval and sinus arrhythmia.  Low voltage in the limb leads.  She is due for a follow-up physical with lab work per PCP.  Past Medical History:  Diagnosis Date  . Anxiety   . Arthritis   . C2 cervical fracture ([place]) 09/17/13   Traumatic fracture witth minimal displacement  . Chronic lung disease    Fibrosis - Dr. Koleen Nimrod  . COPD (chronic obstructive pulmonary disease) ([place])   . Coronary atherosclerosis of native coronary artery    Mild atherosclerosis 3/10, LVEF 60-65%  . Depression   . Diverticulosis   . Essential hypertension   . GERD (gastroesophageal reflux disease)   . PSVT (paroxysmal supraventricular tachycardia) ([place])     Past Surgical History:  Procedure Laterality Date  . ABDOMINAL HYSTERECTOMY    . ANTERIOR RELEASE VERTEBRAL BODY W/ POSTERIOR FUSION    . BACTERIAL OVERGROWTH TEST N/A 02/19/2016   Procedure: BACTERIAL OVERGROWTH TEST;  Surgeon: Daneil Dolin, MD;  [facility];  Service: Endoscopy;  Laterality: N/A;  0700  . BIOPSY N/A 08/04/2015   Procedure: BIOPSY;   Surgeon: Daneil Dolin, MD;  [facility];  Service: Endoscopy;  Laterality: N/A;  Gastric  . BIOPSY  02/21/2017   Procedure: BIOPSY;  Surgeon: Daneil Dolin, MD;  [facility];  Service: Endoscopy;;  ascending colon biopsy   . cataract surgery    . CESAREAN SECTION    . CHOLECYSTECTOMY  1973  . COLONOSCOPY  June 2016   Dr. Britta Mccreedy: moderate diverticulosis in sigmoid colon, surveillance in 5 years   . COLONOSCOPY WITH PROPOFOL N/A 02/21/2017   Dr. Gala Romney: diverticulosis in sigmoid colon, tubular adenomas, segmental biopsies benign. Surveillance in 5 years   . ESOPHAGOGASTRODUODENOSCOPY (EGD) WITH PROPOFOL N/A 08/04/2015   Dr. Rourk:abnormal gastric mucosa s/p biopsy. Reactive gastropathy. Negative H.pylori  . ESOPHAGOGASTRODUODENOSCOPY (EGD) WITH PROPOFOL N/A 02/21/2017   Dr. Gala Romney: empiric esophageal dilatation, small hiatal hernia, otherwise normal  . INCISIONAL HERNIA REPAIR N/A 12/03/2015   Procedure: [name] HERNIORRHAPHY WITH MESH;  Surgeon: Aviva Signs, MD;  [facility];  Service: General;  Laterality: N/A;  . INSERTION OF MESH  12/03/2015   Procedure: INSERTION OF MESH;  Surgeon: Aviva Signs, MD;  [facility];  Service: General;;  . IR RADIOLOGIST EVAL & MGMT  09/22/2017  . MALONEY DILATION N/A 02/21/2017   Procedure: [name] DILATION;  Surgeon: Daneil Dolin, MD;  [facility];  Service: Endoscopy;  Laterality: N/A;  . POLYPECTOMY  02/21/2017   Procedure: POLYPECTOMY;  Surgeon:  Rourk, [name], MD;  [facility];  Service: Endoscopy;;  hepatic flexure polyp cs  . TMJ ARTHROSCOPY Bilateral 02/04/2015   Procedure: BILATERAL TEMPOROMANDIBULAR JOINT (TMJ) ARTHROSCOPY MENISECTOMY WITH FAT GRAFT FROM ABDOMEN ;  Surgeon: Diona Browner, DDS;  Location: [place];  Service: Oral Surgery;  Laterality: Bilateral;  . TOTAL KNEE ARTHROPLASTY Bilateral   . TUBAL LIGATION      Current Outpatient Medications  Medication Sig Dispense Refill  .  acetaminophen (TYLENOL) 650 MG CR tablet Take 1,300 mg by mouth at bedtime as needed for pain.     [name] albuterol (PROAIR HFA) 108 (90 Base) MCG/ACT inhaler Inhale 2 puffs into the lungs every 4 (four) hours as needed for wheezing or shortness of breath.    [name] amitriptyline (ELAVIL) 75 MG tablet Take 75 mg by mouth at bedtime.  6  . Biotin w/ Vitamins C & E (HAIR/SKIN/NAILS PO) Take 1 tablet by mouth daily.     . fluticasone (FLONASE) 50 MCG/ACT nasal spray Place 2 sprays into both nostrils daily as needed for allergies or rhinitis.    . hydrochlorothiazide (HYDRODIURIL) 25 MG tablet Take 25 mg by mouth every morning.    [name] ipratropium-albuterol (DUONEB) 0.5-2.5 (3) MG/3ML SOLN Take 3 mLs by nebulization every 4 (four) hours as needed.    [name] LORazepam (ATIVAN) 1 MG tablet Take 1 mg by mouth 2 (two) times daily.    . meloxicam (MOBIC) 7.5 MG tablet Take 7.5 mg by mouth daily.    . metoprolol succinate (TOPROL-XL) 50 MG 24 hr tablet Take 50 mg by mouth 2 (two) times daily. Take with or immediately following a meal.    . Multiple Vitamin (MULTIVITAMIN PO) Take by mouth daily.    . Multiple Vitamins-Minerals (PRESERVISION AREDS PO) Take 1 tablet by mouth daily.    . Omega-3 Fatty Acids (FISH OIL) 1000 MG CAPS Take 1 capsule by mouth daily.     . pantoprazole (PROTONIX) 40 MG tablet Take 1 tablet (40 mg total) by mouth 2 (two) times daily. 60 tablet 1  . potassium chloride SA (KLOR-CON) 20 MEQ tablet Take 20 mEq by mouth daily.    . sertraline (ZOLOFT) 100 MG tablet Take 100 mg by mouth daily.     [name] umeclidinium-vilanterol (ANORO ELLIPTA) 62.5-25 MCG/INH AEPB Inhale 1 puff into the lungs daily.    . vitamin B-12 (CYANOCOBALAMIN) 1000 MCG tablet Take 1,000 mcg by mouth daily.    . Vitamin D, Ergocalciferol, (DRISDOL) 50000 units CAPS capsule Take 50,000 Units by mouth every 7 (seven) days. Saturday     No current facility-administered medications for this visit.   Allergies:  Sulfonamide derivatives    Social History: The patient  reports that she has never smoked. She has never used smokeless tobacco. She reports that she does not drink alcohol or use drugs.   ROS:  Please see the history of present illness. Otherwise, complete review of systems is positive for none.  All other systems are reviewed and negative.   Physical Exam: VS:  BP 121/66   Pulse 91   Ht 5\' 3"  (1.6 m)   Wt 210 lb 9.6 oz (95.5 kg)   SpO2 95%   BMI 37.31 kg/m , BMI Body mass index is 37.31 kg/m.  Wt Readings from Last 3 Encounters:  10/05/19 210 lb 9.6 oz (95.5 kg)  09/26/19 206 lb 12.8 oz (93.8 kg)  07/19/19 205 lb 3.2 oz (93.1 kg)    General: Patient appears comfortable  at rest. HEENT: Conjunctiva and lids normal, wearing a mask. Neck: Supple, no elevated JVP or carotid bruits, no thyromegaly. Lungs: Decreased breath sounds without wheezing, nonlabored breathing at rest. Cardiac: Regular rate and rhythm, no S3 or significant systolic murmur. Abdomen: Soft, nontender, bowel sounds present. Extremities: No pitting edema, distal pulses 2+. Skin: Warm and dry. Musculoskeletal: No kyphosis. Neuropsychiatric: Alert and oriented x3, affect grossly appropriate.  ECG:  An ECG dated 11/03/2018 was personally reviewed today and demonstrated:  Sinus tachycardia with leftward axis and nonspecific ST changes.  Recent Labwork: 09/27/2019: Creatinine, Ser 1.10   Other Studies Reviewed Today:  Echocardiogram11/29/2017: Study Conclusions  - Left ventricle: The cavity size was normal. Wall thickness was increased in a pattern of mild LVH. Systolic function was normal. The estimated ejection fraction was in the range of 55% to 60%. Doppler parameters are consistent with abnormal left ventricular relaxation (grade 1 diastolic dysfunction). - Aortic valve: There was mild regurgitation. Valve area (VTI): 2.42 cm^2. Valve area (Vmax): 2.22 cm^2. Valve area (Vmean): 2.29 cm^2. - Technically difficult  study.  Lexiscan Myoview 01/26/2018:  There was no ST segment deviation noted during stress.  The study is normal. There are no perfusion defects  This is a low risk study.  The left ventricular ejection fraction is normal (55-65%).  Assessment and Plan:  1.  History of PSVT.  She reports no significant palpitations and has had no obvious recurrences on Toprol-XL.  ECG reviewed.  Continue with observation.  2.  History of depression, on Zoloft with follow-up by Dr. Sherrie Sport.  She has lost 2 of her children over the last year and states that she does not feel like her medication is working very well.  Behavioral health referral offered.  3.  Essential hypertension by history, blood pressure is well controlled today.  In addition to Toprol-XL she is also on hydrochlorothiazide with potassium supplements.  4.  Mild coronary atherosclerosis by previous work-up, Myoview study from May 2019 was low risk and follow-up.  Medication Adjustments/Labs and Tests Ordered: Current medicines are reviewed at length with the patient today.  Concerns regarding medicines are outlined above.   Tests Ordered: Orders Placed This Encounter  Procedures  . Ambulatory referral to [facility]  . EKG 12-Lead    Medication Changes: No orders of the defined types were placed in this encounter.   Disposition:  Follow up 1 year in the [place] office.  Signed, Satira Sark, MD, [facility] 10/05/2019 1:27 PM    [facility] [place], [place], [place] [address] Phone: [phone]; Fax: [phone]

## 2019-10-16 DIAGNOSIS — M109 Gout, unspecified: Secondary | ICD-10-CM | POA: Diagnosis not present

## 2019-10-16 DIAGNOSIS — K2100 Gastro-esophageal reflux disease with esophagitis, without bleeding: Secondary | ICD-10-CM | POA: Diagnosis not present

## 2019-10-16 DIAGNOSIS — I10 Essential (primary) hypertension: Secondary | ICD-10-CM | POA: Diagnosis not present

## 2019-10-16 DIAGNOSIS — J449 Chronic obstructive pulmonary disease, unspecified: Secondary | ICD-10-CM | POA: Diagnosis not present

## 2019-11-20 ENCOUNTER — Encounter (INDEPENDENT_AMBULATORY_CARE_PROVIDER_SITE_OTHER): Payer: Medicare Other | Admitting: Ophthalmology

## 2019-11-20 DIAGNOSIS — D3132 Benign neoplasm of left choroid: Secondary | ICD-10-CM

## 2019-11-20 DIAGNOSIS — H353111 Nonexudative age-related macular degeneration, right eye, early dry stage: Secondary | ICD-10-CM | POA: Diagnosis not present

## 2019-11-20 DIAGNOSIS — H43813 Vitreous degeneration, bilateral: Secondary | ICD-10-CM | POA: Diagnosis not present

## 2019-11-20 DIAGNOSIS — H353122 Nonexudative age-related macular degeneration, left eye, intermediate dry stage: Secondary | ICD-10-CM | POA: Diagnosis not present

## 2019-11-20 DIAGNOSIS — M7061 Trochanteric bursitis, right hip: Secondary | ICD-10-CM | POA: Diagnosis not present

## 2019-11-20 DIAGNOSIS — M7062 Trochanteric bursitis, left hip: Secondary | ICD-10-CM | POA: Diagnosis not present

## 2019-12-10 ENCOUNTER — Other Ambulatory Visit: Payer: Self-pay | Admitting: Cardiology

## 2019-12-11 DIAGNOSIS — H52203 Unspecified astigmatism, bilateral: Secondary | ICD-10-CM | POA: Diagnosis not present

## 2019-12-11 DIAGNOSIS — H26493 Other secondary cataract, bilateral: Secondary | ICD-10-CM | POA: Diagnosis not present

## 2019-12-11 DIAGNOSIS — Z961 Presence of intraocular lens: Secondary | ICD-10-CM | POA: Diagnosis not present

## 2019-12-11 DIAGNOSIS — H353131 Nonexudative age-related macular degeneration, bilateral, early dry stage: Secondary | ICD-10-CM | POA: Diagnosis not present

## 2019-12-21 DIAGNOSIS — E039 Hypothyroidism, unspecified: Secondary | ICD-10-CM | POA: Diagnosis not present

## 2019-12-21 DIAGNOSIS — Z79899 Other long term (current) drug therapy: Secondary | ICD-10-CM | POA: Diagnosis not present

## 2019-12-27 ENCOUNTER — Ambulatory Visit: Payer: Medicare Other | Admitting: Orthopaedic Surgery

## 2020-01-16 NOTE — Progress Notes (Signed)
Referring Provider: Scotty Court, DO Primary Care Physician:  Neale Burly, MD Primary GI: Dr. Gala Romney   Chief Complaint  Patient presents with  . lower abd pain    when she has to have BM  . change in bowels    BM starts as hard then will be loose  . Nausea    at times    HPI:   Michelle Zavala is a 74 y.o. female presenting today with a history of chronic abdominal pain,diarrhea, dysphagia. 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 Progression of SMA disease could cause issues, but clinical monitoring recommended per IR. She was seen by Vascular Surgery due to renal artery disease as well. Medical therapy recommended. Symptomatic fat-containing ventral hernia noted at visit in March 2017, undergoing hernia repair in March 2017.TCS/EGD with negative segmental colonic biopsies, needing surveillance in 5 years due to colonic adenomas. EGD with empiric dilatation, small hiatal hernia. Celiac serologies negative. 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 Negative hydrogen breath test for small bowel bacterial overgrowth.CTA updated 08/24/17: mixed plaque at origin of SMA but not felt to be greater than 50%. Celiac and IMA remained patent. Discussed with Dr. Earleen Newport, who recommended IR outpatient clinic evaluation. She was not felt to have chronic mesenteric ischemia.  CT abd/pelvis with contrast Jan 2021: no acute findings.   Sometimes gets sick on stomach then goes to bathroom. Sometimes dizzy-headed. Present for a few months. Lower abdomen cramping prior to BM until she relieves herself. Will be loose. BMs depending on what she eats. BM usually daily. Sometimes hard to get out at first. Then will be loose but not all the time. Will sometimes vomit while trying to have a BM. Happened one time with eating a salad and had spam on it. Taking Benefiber sometimes. Used to eat Activia. Overall she feels better than she  has in the past. She can tell that bowel habits are dependent on food she is eating.  Past Medical History:  Diagnosis Date  . Anxiety   . Arthritis   . C2 cervical fracture ([place]) 09/17/13   Traumatic fracture witth minimal displacement  . Chronic lung disease    Fibrosis - Dr. Koleen Nimrod  . COPD (chronic obstructive pulmonary disease) ([place])   . Coronary atherosclerosis of native coronary artery    Mild atherosclerosis 3/10, LVEF 60-65%  . Depression   . Diverticulosis   . Essential hypertension   . GERD (gastroesophageal reflux disease)   . PSVT (paroxysmal supraventricular tachycardia) ([place])     Past Surgical History:  Procedure Laterality Date  . ABDOMINAL HYSTERECTOMY    . ANTERIOR RELEASE VERTEBRAL BODY W/ POSTERIOR FUSION    . BACTERIAL OVERGROWTH TEST N/A 02/19/2016   Procedure: BACTERIAL OVERGROWTH TEST;  Surgeon: Daneil Dolin, MD;  [facility];  Service: Endoscopy;  Laterality: N/A;  0700  . BIOPSY N/A 08/04/2015   Procedure: BIOPSY;  Surgeon: Daneil Dolin, MD;  Location[facility];  Service: Endoscopy;  Laterality: N/A;  Gastric  . BIOPSY  02/21/2017   Procedure: BIOPSY;  Surgeon: Daneil Dolin, MD;  [facility];  Service: Endoscopy;;  ascending colon biopsy   . cataract surgery    . CESAREAN SECTION    . CHOLECYSTECTOMY  1973  . COLONOSCOPY  June 2016   Dr. Britta Mccreedy: moderate diverticulosis in sigmoid colon, surveillance in 5 years   .  COLONOSCOPY WITH PROPOFOL N/A 02/21/2017   Dr. Gala Romney: diverticulosis in sigmoid colon, tubular adenomas, segmental biopsies benign. Surveillance in 5 years   . ESOPHAGOGASTRODUODENOSCOPY (EGD) WITH PROPOFOL N/A 08/04/2015   Dr. Rourk:abnormal gastric mucosa s/p biopsy. Reactive gastropathy. Negative H.pylori  . ESOPHAGOGASTRODUODENOSCOPY (EGD) WITH PROPOFOL N/A 02/21/2017   Dr. Gala Romney: empiric esophageal dilatation, small hiatal hernia, otherwise normal  . INCISIONAL HERNIA REPAIR N/A 12/03/2015   Procedure:  [name] HERNIORRHAPHY WITH MESH;  Surgeon: Aviva Signs, MD;  [facility];  Service: General;  Laterality: N/A;  . INSERTION OF MESH  12/03/2015   Procedure: INSERTION OF MESH;  Surgeon: Aviva Signs, MD;  Location: [facility];  Service: General;;  . IR RADIOLOGIST EVAL & MGMT  09/22/2017  . MALONEY DILATION N/A 02/21/2017   Procedure: [name] DILATION;  Surgeon: Daneil Dolin, MD;  [facility];  Service: Endoscopy;  Laterality: N/A;  . POLYPECTOMY  02/21/2017   Procedure: POLYPECTOMY;  Surgeon: Daneil Dolin, MD;  [facility];  Service: Endoscopy;;  hepatic flexure polyp cs  . TMJ ARTHROSCOPY Bilateral 02/04/2015   Procedure: BILATERAL TEMPOROMANDIBULAR JOINT (TMJ) ARTHROSCOPY MENISECTOMY WITH FAT GRAFT FROM ABDOMEN ;  Surgeon: Diona Browner, DDS;  Location: [facility];  Service: Oral Surgery;  Laterality: Bilateral;  . TOTAL KNEE ARTHROPLASTY Bilateral   . TUBAL LIGATION      Current Outpatient Medications  Medication Sig Dispense Refill  . acetaminophen (TYLENOL) 650 MG CR tablet Take 1,300 mg by mouth at bedtime as needed for pain.     [name] albuterol (PROAIR HFA) 108 (90 Base) MCG/ACT inhaler Inhale 2 puffs into the lungs every 4 (four) hours as needed for wheezing or shortness of breath.    [name] amitriptyline (ELAVIL) 75 MG tablet Take 75 mg by mouth at bedtime.  6  . Biotin w/ Vitamins C & E (HAIR/SKIN/NAILS PO) Take 1 tablet by mouth daily.     . fluticasone (FLONASE) 50 MCG/ACT nasal spray Place 2 sprays into both nostrils daily as needed for allergies or rhinitis.    . hydrochlorothiazide (HYDRODIURIL) 25 MG tablet Take 25 mg by mouth every morning.    [name] ipratropium-albuterol (DUONEB) 0.5-2.5 (3) MG/3ML SOLN Take 3 mLs by nebulization every 4 (four) hours as needed.    [name] LORazepam (ATIVAN) 1 MG tablet Take 1 mg by mouth 2 (two) times daily.    . Multiple Vitamin (MULTIVITAMIN PO) Take by mouth daily.    . Multiple Vitamins-Minerals (PRESERVISION AREDS PO) Take 1  tablet by mouth daily.    . Omega-3 Fatty Acids (FISH OIL) 1000 MG CAPS Take 1 capsule by mouth daily.     . pantoprazole (PROTONIX) 40 MG tablet Take 1 tablet (40 mg total) by mouth 2 (two) times daily. 60 tablet 1  . potassium chloride SA (KLOR-CON) 20 MEQ tablet Take 20 mEq by mouth daily.    . sertraline (ZOLOFT) 100 MG tablet Take 100 mg by mouth daily.     [name] umeclidinium-vilanterol (ANORO ELLIPTA) 62.5-25 MCG/INH AEPB Inhale 1 puff into the lungs daily.    . vitamin B-12 (CYANOCOBALAMIN) 1000 MCG tablet Take 1,000 mcg by mouth daily.    . Vitamin D, Ergocalciferol, (DRISDOL) 50000 units CAPS capsule Take 50,000 Units by mouth every 7 (seven) days. Saturday    . meloxicam (MOBIC) 7.5 MG tablet Take 7.5 mg by mouth daily.    . metoprolol succinate (TOPROL-XL) 50 MG 24 hr tablet TAKE ONE TABLET BY MOUTH TWICE DAILY. (Patient not  taking: Reported on 01/17/2020) 180 tablet 1   No current facility-administered medications for this visit.    Allergies as of 01/17/2020 - Review Complete 01/17/2020  Allergen Reaction Noted  . Sulfonamide derivatives Hives     Family History  Problem Relation Age of Onset  . Coronary artery disease Father        Premature  . Heart disease Father   . Coronary artery disease Brother        Premature  . Coronary artery disease Sister        Premature  . Colon cancer Son     Social History   Socioeconomic History  . Marital status: Widowed    Spouse name: Not on file  . Number of children: 3  . Years of education: 7  . Highest education level: Not on file  Occupational History  . Occupation: Retired  Tobacco Use  . Smoking status: Never Smoker  . Smokeless tobacco: Never Used  Substance and Sexual Activity  . Alcohol use: No    Alcohol/week: 0.0 standard drinks  . Drug use: No  . Sexual activity: Never    Birth control/protection: None, Surgical  Other Topics Concern  . Not on file  Social History Narrative   Occasionally drinks Pepsi     Social Determinants of Health   Financial Resource Strain:   . Difficulty of Paying Living Expenses:   Food Insecurity:   . Worried About Charity fundraiser in the Last Year:   . Arboriculturist in the Last Year:   Transportation Zavala:   . Film/video editor (Medical):   [name] Lack of Transportation (Non-Medical):   Physical Activity:   . Days of Exercise per Week:   . Minutes of Exercise per Session:   Stress:   . Feeling of Stress :   Social Connections:   . Frequency of Communication with Friends and Family:   . Frequency of Social Gatherings with Friends and Family:   . Attends Religious Services:   . Active Member of Clubs or Organizations:   . Attends Archivist Meetings:   [name] Marital Status:     Review of Systems: Gen: Denies fever, chills, anorexia. Denies fatigue, weakness, weight loss.  CV: Denies chest pain, palpitations, syncope, peripheral edema, and claudication. Resp: Denies dyspnea at rest, cough, wheezing, coughing up blood, and pleurisy. GI: see HPI Derm: Denies rash, itching, dry skin Psych: Denies depression, anxiety, memory loss, confusion. No homicidal or suicidal ideation.  Heme: Denies bruising, bleeding, and enlarged lymph nodes.  Physical Exam: BP 136/87   Pulse 70   Temp (!) 97 F (36.1 C) (Oral)   Ht 5\' 3"  (1.6 m)   Wt 203 lb (92.1 kg)   BMI 35.96 kg/m  General:   Alert and oriented. No distress noted. Pleasant and cooperative.  Head:  Normocephalic and atraumatic. Eyes:  Conjuctiva clear without scleral icterus. Mouth:  Mask in place Abdomen:  +BS, soft, non-tender and non-distended. No rebound or guarding. No HSM or masses noted. Msk:  Symmetrical without gross deformities. Normal posture. Extremities:  Without edema. Neurologic:  Alert and  oriented x4 Psych:  Alert and cooperative. Normal mood and affect.  ASSESSMENT: Michelle Zavala is a 74 y.o. female presenting today with history of chronic abdominal pain, GERD,  alternating constipation and diarrhea, doing well from a GI standpoint.  Dietary behaviors triggering episodes of fecal urgency and cramping. We discussed avoidance of these foods and adding back Benefiber supplementation,  as she has done well with this in the past. No alarm signs/symptoms. Continue with Protonix BID. No concerning upper GI signs/symptoms.    PLAN:   Protonix BID  Add supplemental Benefiber  Return in 1 year  Next surveillance colonoscopy in 2023  Michelle Needs, PhD, ANP-BC [facility] Gastroenterology

## 2020-01-17 ENCOUNTER — Other Ambulatory Visit: Payer: Self-pay

## 2020-01-17 ENCOUNTER — Ambulatory Visit (INDEPENDENT_AMBULATORY_CARE_PROVIDER_SITE_OTHER): Payer: Medicare Other | Admitting: Gastroenterology

## 2020-01-17 ENCOUNTER — Encounter: Payer: Self-pay | Admitting: Gastroenterology

## 2020-01-17 VITALS — BP 136/87 | HR 70 | Temp 97.0°F | Ht 63.0 in | Wt 203.0 lb

## 2020-01-17 DIAGNOSIS — R1030 Lower abdominal pain, unspecified: Secondary | ICD-10-CM

## 2020-01-17 NOTE — Patient Instructions (Signed)
I recommend adding back Benefiber to your diet.  Please call with any changes or concerns. Otherwise, we will see you in 1 year!  Try to avoid/limit the foods that you know trigger episodes.  I enjoyed seeing you again today! As you know, I value our relationship and want to provide genuine, compassionate, and quality care. I welcome your feedback. If you receive a survey regarding your visit,  I greatly appreciate you taking time to fill this out. See you next time!  Annitta Needs, PhD, ANP-BC [facility] Gastroenterology

## 2020-01-23 DIAGNOSIS — Z0000 Encounter for general adult medical examination without abnormal findings: Secondary | ICD-10-CM | POA: Diagnosis not present

## 2020-01-23 DIAGNOSIS — M109 Gout, unspecified: Secondary | ICD-10-CM | POA: Diagnosis not present

## 2020-01-23 DIAGNOSIS — K2100 Gastro-esophageal reflux disease with esophagitis, without bleeding: Secondary | ICD-10-CM | POA: Diagnosis not present

## 2020-01-23 DIAGNOSIS — J449 Chronic obstructive pulmonary disease, unspecified: Secondary | ICD-10-CM | POA: Diagnosis not present

## 2020-01-23 DIAGNOSIS — I10 Essential (primary) hypertension: Secondary | ICD-10-CM | POA: Diagnosis not present

## 2020-02-04 DIAGNOSIS — M5412 Radiculopathy, cervical region: Secondary | ICD-10-CM | POA: Diagnosis not present

## 2020-02-04 DIAGNOSIS — M5136 Other intervertebral disc degeneration, lumbar region: Secondary | ICD-10-CM | POA: Diagnosis not present

## 2020-02-04 DIAGNOSIS — M9903 Segmental and somatic dysfunction of lumbar region: Secondary | ICD-10-CM | POA: Diagnosis not present

## 2020-02-04 DIAGNOSIS — M9901 Segmental and somatic dysfunction of cervical region: Secondary | ICD-10-CM | POA: Diagnosis not present

## 2020-02-07 DIAGNOSIS — M5136 Other intervertebral disc degeneration, lumbar region: Secondary | ICD-10-CM | POA: Diagnosis not present

## 2020-02-07 DIAGNOSIS — M9903 Segmental and somatic dysfunction of lumbar region: Secondary | ICD-10-CM | POA: Diagnosis not present

## 2020-02-07 DIAGNOSIS — M9901 Segmental and somatic dysfunction of cervical region: Secondary | ICD-10-CM | POA: Diagnosis not present

## 2020-02-07 DIAGNOSIS — M5412 Radiculopathy, cervical region: Secondary | ICD-10-CM | POA: Diagnosis not present

## 2020-02-12 DIAGNOSIS — M5136 Other intervertebral disc degeneration, lumbar region: Secondary | ICD-10-CM | POA: Diagnosis not present

## 2020-02-12 DIAGNOSIS — M5412 Radiculopathy, cervical region: Secondary | ICD-10-CM | POA: Diagnosis not present

## 2020-02-12 DIAGNOSIS — M9903 Segmental and somatic dysfunction of lumbar region: Secondary | ICD-10-CM | POA: Diagnosis not present

## 2020-02-12 DIAGNOSIS — M9901 Segmental and somatic dysfunction of cervical region: Secondary | ICD-10-CM | POA: Diagnosis not present

## 2020-02-13 ENCOUNTER — Encounter (INDEPENDENT_AMBULATORY_CARE_PROVIDER_SITE_OTHER): Payer: Medicare Other | Admitting: Ophthalmology

## 2020-02-13 DIAGNOSIS — M9903 Segmental and somatic dysfunction of lumbar region: Secondary | ICD-10-CM | POA: Diagnosis not present

## 2020-02-13 DIAGNOSIS — M5136 Other intervertebral disc degeneration, lumbar region: Secondary | ICD-10-CM | POA: Diagnosis not present

## 2020-02-13 DIAGNOSIS — M9901 Segmental and somatic dysfunction of cervical region: Secondary | ICD-10-CM | POA: Diagnosis not present

## 2020-02-13 DIAGNOSIS — M5412 Radiculopathy, cervical region: Secondary | ICD-10-CM | POA: Diagnosis not present

## 2020-02-19 DIAGNOSIS — M9903 Segmental and somatic dysfunction of lumbar region: Secondary | ICD-10-CM | POA: Diagnosis not present

## 2020-02-19 DIAGNOSIS — M5412 Radiculopathy, cervical region: Secondary | ICD-10-CM | POA: Diagnosis not present

## 2020-02-19 DIAGNOSIS — M9901 Segmental and somatic dysfunction of cervical region: Secondary | ICD-10-CM | POA: Diagnosis not present

## 2020-02-19 DIAGNOSIS — M5136 Other intervertebral disc degeneration, lumbar region: Secondary | ICD-10-CM | POA: Diagnosis not present

## 2020-02-20 DIAGNOSIS — M9901 Segmental and somatic dysfunction of cervical region: Secondary | ICD-10-CM | POA: Diagnosis not present

## 2020-02-20 DIAGNOSIS — M5136 Other intervertebral disc degeneration, lumbar region: Secondary | ICD-10-CM | POA: Diagnosis not present

## 2020-02-20 DIAGNOSIS — M9903 Segmental and somatic dysfunction of lumbar region: Secondary | ICD-10-CM | POA: Diagnosis not present

## 2020-02-20 DIAGNOSIS — M5412 Radiculopathy, cervical region: Secondary | ICD-10-CM | POA: Diagnosis not present

## 2020-02-21 DIAGNOSIS — H43813 Vitreous degeneration, bilateral: Secondary | ICD-10-CM | POA: Diagnosis not present

## 2020-02-26 DIAGNOSIS — M7062 Trochanteric bursitis, left hip: Secondary | ICD-10-CM | POA: Diagnosis not present

## 2020-02-26 DIAGNOSIS — M7061 Trochanteric bursitis, right hip: Secondary | ICD-10-CM | POA: Diagnosis not present

## 2020-02-27 DIAGNOSIS — M5136 Other intervertebral disc degeneration, lumbar region: Secondary | ICD-10-CM | POA: Diagnosis not present

## 2020-02-27 DIAGNOSIS — M9903 Segmental and somatic dysfunction of lumbar region: Secondary | ICD-10-CM | POA: Diagnosis not present

## 2020-02-27 DIAGNOSIS — M9901 Segmental and somatic dysfunction of cervical region: Secondary | ICD-10-CM | POA: Diagnosis not present

## 2020-02-27 DIAGNOSIS — M5412 Radiculopathy, cervical region: Secondary | ICD-10-CM | POA: Diagnosis not present

## 2020-02-28 DIAGNOSIS — M5412 Radiculopathy, cervical region: Secondary | ICD-10-CM | POA: Diagnosis not present

## 2020-02-28 DIAGNOSIS — M9903 Segmental and somatic dysfunction of lumbar region: Secondary | ICD-10-CM | POA: Diagnosis not present

## 2020-02-28 DIAGNOSIS — M9901 Segmental and somatic dysfunction of cervical region: Secondary | ICD-10-CM | POA: Diagnosis not present

## 2020-02-28 DIAGNOSIS — M5136 Other intervertebral disc degeneration, lumbar region: Secondary | ICD-10-CM | POA: Diagnosis not present

## 2020-03-04 DIAGNOSIS — M5136 Other intervertebral disc degeneration, lumbar region: Secondary | ICD-10-CM | POA: Diagnosis not present

## 2020-03-04 DIAGNOSIS — M5412 Radiculopathy, cervical region: Secondary | ICD-10-CM | POA: Diagnosis not present

## 2020-03-04 DIAGNOSIS — M9901 Segmental and somatic dysfunction of cervical region: Secondary | ICD-10-CM | POA: Diagnosis not present

## 2020-03-04 DIAGNOSIS — M9903 Segmental and somatic dysfunction of lumbar region: Secondary | ICD-10-CM | POA: Diagnosis not present

## 2020-03-05 DIAGNOSIS — M9901 Segmental and somatic dysfunction of cervical region: Secondary | ICD-10-CM | POA: Diagnosis not present

## 2020-03-05 DIAGNOSIS — M5136 Other intervertebral disc degeneration, lumbar region: Secondary | ICD-10-CM | POA: Diagnosis not present

## 2020-03-05 DIAGNOSIS — M5412 Radiculopathy, cervical region: Secondary | ICD-10-CM | POA: Diagnosis not present

## 2020-03-05 DIAGNOSIS — M9903 Segmental and somatic dysfunction of lumbar region: Secondary | ICD-10-CM | POA: Diagnosis not present

## 2020-03-06 DIAGNOSIS — I10 Essential (primary) hypertension: Secondary | ICD-10-CM | POA: Diagnosis not present

## 2020-03-06 DIAGNOSIS — R300 Dysuria: Secondary | ICD-10-CM | POA: Diagnosis not present

## 2020-03-11 DIAGNOSIS — M5412 Radiculopathy, cervical region: Secondary | ICD-10-CM | POA: Diagnosis not present

## 2020-03-11 DIAGNOSIS — M5136 Other intervertebral disc degeneration, lumbar region: Secondary | ICD-10-CM | POA: Diagnosis not present

## 2020-03-11 DIAGNOSIS — M9901 Segmental and somatic dysfunction of cervical region: Secondary | ICD-10-CM | POA: Diagnosis not present

## 2020-03-11 DIAGNOSIS — M9903 Segmental and somatic dysfunction of lumbar region: Secondary | ICD-10-CM | POA: Diagnosis not present

## 2020-03-12 DIAGNOSIS — M5412 Radiculopathy, cervical region: Secondary | ICD-10-CM | POA: Diagnosis not present

## 2020-03-12 DIAGNOSIS — M9901 Segmental and somatic dysfunction of cervical region: Secondary | ICD-10-CM | POA: Diagnosis not present

## 2020-03-12 DIAGNOSIS — M5136 Other intervertebral disc degeneration, lumbar region: Secondary | ICD-10-CM | POA: Diagnosis not present

## 2020-03-12 DIAGNOSIS — M9903 Segmental and somatic dysfunction of lumbar region: Secondary | ICD-10-CM | POA: Diagnosis not present

## 2020-03-18 DIAGNOSIS — H43813 Vitreous degeneration, bilateral: Secondary | ICD-10-CM | POA: Diagnosis not present

## 2020-03-18 DIAGNOSIS — H43393 Other vitreous opacities, bilateral: Secondary | ICD-10-CM | POA: Diagnosis not present

## 2020-03-19 DIAGNOSIS — M5136 Other intervertebral disc degeneration, lumbar region: Secondary | ICD-10-CM | POA: Diagnosis not present

## 2020-03-19 DIAGNOSIS — M9903 Segmental and somatic dysfunction of lumbar region: Secondary | ICD-10-CM | POA: Diagnosis not present

## 2020-03-19 DIAGNOSIS — M5412 Radiculopathy, cervical region: Secondary | ICD-10-CM | POA: Diagnosis not present

## 2020-03-19 DIAGNOSIS — M9901 Segmental and somatic dysfunction of cervical region: Secondary | ICD-10-CM | POA: Diagnosis not present

## 2020-03-26 DIAGNOSIS — M9901 Segmental and somatic dysfunction of cervical region: Secondary | ICD-10-CM | POA: Diagnosis not present

## 2020-03-26 DIAGNOSIS — M5412 Radiculopathy, cervical region: Secondary | ICD-10-CM | POA: Diagnosis not present

## 2020-03-26 DIAGNOSIS — M5136 Other intervertebral disc degeneration, lumbar region: Secondary | ICD-10-CM | POA: Diagnosis not present

## 2020-03-26 DIAGNOSIS — M9903 Segmental and somatic dysfunction of lumbar region: Secondary | ICD-10-CM | POA: Diagnosis not present

## 2020-03-28 DIAGNOSIS — R42 Dizziness and giddiness: Secondary | ICD-10-CM | POA: Diagnosis not present

## 2020-03-28 DIAGNOSIS — R197 Diarrhea, unspecified: Secondary | ICD-10-CM | POA: Diagnosis not present

## 2020-03-28 DIAGNOSIS — R519 Headache, unspecified: Secondary | ICD-10-CM | POA: Diagnosis not present

## 2020-04-01 DIAGNOSIS — M5412 Radiculopathy, cervical region: Secondary | ICD-10-CM | POA: Diagnosis not present

## 2020-04-01 DIAGNOSIS — M9901 Segmental and somatic dysfunction of cervical region: Secondary | ICD-10-CM | POA: Diagnosis not present

## 2020-04-01 DIAGNOSIS — M9903 Segmental and somatic dysfunction of lumbar region: Secondary | ICD-10-CM | POA: Diagnosis not present

## 2020-04-01 DIAGNOSIS — M5136 Other intervertebral disc degeneration, lumbar region: Secondary | ICD-10-CM | POA: Diagnosis not present

## 2020-04-08 DIAGNOSIS — R519 Headache, unspecified: Secondary | ICD-10-CM | POA: Diagnosis not present

## 2020-04-08 DIAGNOSIS — I10 Essential (primary) hypertension: Secondary | ICD-10-CM | POA: Diagnosis not present

## 2020-04-08 DIAGNOSIS — E785 Hyperlipidemia, unspecified: Secondary | ICD-10-CM | POA: Diagnosis not present

## 2020-04-14 ENCOUNTER — Telehealth: Payer: Self-pay | Admitting: Specialist

## 2020-04-14 NOTE — Telephone Encounter (Signed)
This is a Writer patient, but we have one too by this name, can you please make sure that this is the correct patient?

## 2020-04-14 NOTE — Telephone Encounter (Signed)
Please advise-- ? See her first

## 2020-04-14 NOTE — Telephone Encounter (Signed)
Spoke with patient again and she said Dr Louanne Skye  was the doctor that sent her to [facility] for her neck and head. Patient said Dr Louanne Skye know all about what is wrong with her. The number to contact patient is [phone]

## 2020-04-14 NOTE — Telephone Encounter (Signed)
Patient called asked if she can be set up for an MRI. Patient said she need the MRI of her neck and head. The number to contact patient is [phone]

## 2020-04-15 DIAGNOSIS — M9901 Segmental and somatic dysfunction of cervical region: Secondary | ICD-10-CM | POA: Diagnosis not present

## 2020-04-15 DIAGNOSIS — M5412 Radiculopathy, cervical region: Secondary | ICD-10-CM | POA: Diagnosis not present

## 2020-04-15 DIAGNOSIS — M9903 Segmental and somatic dysfunction of lumbar region: Secondary | ICD-10-CM | POA: Diagnosis not present

## 2020-04-15 DIAGNOSIS — M5136 Other intervertebral disc degeneration, lumbar region: Secondary | ICD-10-CM | POA: Diagnosis not present

## 2020-04-15 NOTE — Telephone Encounter (Signed)
I called and advised pt of message from Dr. Louanne Skye, she understands and she will call them and see what she can do.

## 2020-04-15 NOTE — Telephone Encounter (Signed)
I do not plan to operate on this patient she has been in pain management I have not received any requests from her physicians in [place] to help them facilitate getting more MRIs. I can not order an MRI without a reason, so we will schedule her for an exam. If she could call the MDs In [place] and have them send [place] her information, office notes and any requests they may have for tests, we will help to facilitate them. I need their notes to see her since that is the reason for her visit. JEN

## 2020-04-16 DIAGNOSIS — M5136 Other intervertebral disc degeneration, lumbar region: Secondary | ICD-10-CM | POA: Diagnosis not present

## 2020-04-16 DIAGNOSIS — M9901 Segmental and somatic dysfunction of cervical region: Secondary | ICD-10-CM | POA: Diagnosis not present

## 2020-04-16 DIAGNOSIS — M5412 Radiculopathy, cervical region: Secondary | ICD-10-CM | POA: Diagnosis not present

## 2020-04-16 DIAGNOSIS — M9903 Segmental and somatic dysfunction of lumbar region: Secondary | ICD-10-CM | POA: Diagnosis not present

## 2020-04-21 DIAGNOSIS — I10 Essential (primary) hypertension: Secondary | ICD-10-CM | POA: Diagnosis not present

## 2020-04-21 DIAGNOSIS — M109 Gout, unspecified: Secondary | ICD-10-CM | POA: Diagnosis not present

## 2020-04-21 DIAGNOSIS — J449 Chronic obstructive pulmonary disease, unspecified: Secondary | ICD-10-CM | POA: Diagnosis not present

## 2020-04-21 DIAGNOSIS — K2100 Gastro-esophageal reflux disease with esophagitis, without bleeding: Secondary | ICD-10-CM | POA: Diagnosis not present

## 2020-04-21 DIAGNOSIS — Z0000 Encounter for general adult medical examination without abnormal findings: Secondary | ICD-10-CM | POA: Diagnosis not present

## 2020-04-25 DIAGNOSIS — J449 Chronic obstructive pulmonary disease, unspecified: Secondary | ICD-10-CM | POA: Diagnosis not present

## 2020-04-25 DIAGNOSIS — K2100 Gastro-esophageal reflux disease with esophagitis, without bleeding: Secondary | ICD-10-CM | POA: Diagnosis not present

## 2020-04-25 DIAGNOSIS — M109 Gout, unspecified: Secondary | ICD-10-CM | POA: Diagnosis not present

## 2020-04-25 DIAGNOSIS — I10 Essential (primary) hypertension: Secondary | ICD-10-CM | POA: Diagnosis not present

## 2020-05-01 DIAGNOSIS — M5136 Other intervertebral disc degeneration, lumbar region: Secondary | ICD-10-CM | POA: Diagnosis not present

## 2020-05-01 DIAGNOSIS — M9901 Segmental and somatic dysfunction of cervical region: Secondary | ICD-10-CM | POA: Diagnosis not present

## 2020-05-01 DIAGNOSIS — M9903 Segmental and somatic dysfunction of lumbar region: Secondary | ICD-10-CM | POA: Diagnosis not present

## 2020-05-01 DIAGNOSIS — M5412 Radiculopathy, cervical region: Secondary | ICD-10-CM | POA: Diagnosis not present

## 2020-05-08 ENCOUNTER — Other Ambulatory Visit: Payer: Self-pay

## 2020-05-08 ENCOUNTER — Encounter: Payer: Self-pay | Admitting: Orthopaedic Surgery

## 2020-05-08 ENCOUNTER — Ambulatory Visit (INDEPENDENT_AMBULATORY_CARE_PROVIDER_SITE_OTHER): Payer: Medicare Other | Admitting: Orthopaedic Surgery

## 2020-05-08 ENCOUNTER — Ambulatory Visit (INDEPENDENT_AMBULATORY_CARE_PROVIDER_SITE_OTHER): Payer: Medicare Other

## 2020-05-08 VITALS — Ht 63.0 in | Wt 210.0 lb

## 2020-05-08 DIAGNOSIS — M542 Cervicalgia: Secondary | ICD-10-CM | POA: Diagnosis not present
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
## 2020-05-31 ENCOUNTER — Ambulatory Visit
Admission: RE | Admit: 2020-05-31 | Discharge: 2020-05-31 | Disposition: A | Discharge status: Home / Self Care | Payer: Medicare Other | Source: Ambulatory Visit | Attending: Orthopaedic Surgery | Admitting: Orthopaedic Surgery

## 2020-05-31 ENCOUNTER — Other Ambulatory Visit: Payer: Self-pay

## 2020-05-31 DIAGNOSIS — M542 Cervicalgia: Secondary | ICD-10-CM

## 2020-06-03 ENCOUNTER — Telehealth: Payer: Self-pay

## 2020-06-03 DIAGNOSIS — M7062 Trochanteric bursitis, left hip: Secondary | ICD-10-CM | POA: Diagnosis not present

## 2020-06-03 DIAGNOSIS — M7061 Trochanteric bursitis, right hip: Secondary | ICD-10-CM | POA: Diagnosis not present

## 2020-06-03 NOTE — Telephone Encounter (Signed)
Please advise. OK for open MRI?

## 2020-06-03 NOTE — Telephone Encounter (Signed)
Patient came in she wasn't able to do MRI because is was a closed end she is requesting a open ended.call back:[phone]

## 2020-06-03 NOTE — Telephone Encounter (Signed)
OK thanks. OK for valium if needed.

## 2020-06-04 ENCOUNTER — Telehealth: Payer: Self-pay | Admitting: Orthopaedic Surgery

## 2020-06-04 MED ORDER — DIAZEPAM 5 MG PO TABS: ORAL_TABLET | ORAL | 0 refills | Status: DC

## 2020-06-04 NOTE — Telephone Encounter (Signed)
I left voicemail for patient requesting return call. Advised that I can enter exam to be done at [facility] in more open scanner and that Dr. Lorin Mercy can premedicate if needed. Requested patient return call to let me know if she would like the medication and is willing to come to [facility] for scan.

## 2020-06-04 NOTE — Addendum Note (Signed)
Addended by: Meyer Cory on: 06/04/2020 04:29 PM   Modules accepted: Orders

## 2020-06-04 NOTE — Telephone Encounter (Signed)
Duplicate message in chart.  

## 2020-06-04 NOTE — Telephone Encounter (Signed)
Patient is returning [place] call. Patient states yes she is willing to come to [facility] for MRI and yes she will need premedication. Please call patient wit imaging appt and when premedication will be ordered before MRI. Patient phone number is [phone].

## 2020-06-04 NOTE — Telephone Encounter (Signed)
I called Valium in to pharmacy.  Sabrina--could you please change location of MRI to [facility], larger, more open unit?  Patient attempted at [facility] but could not tolerate due to claustrophobia. I have called Valium in for her to take prior to procedure.  Thanks.

## 2020-06-05 ENCOUNTER — Ambulatory Visit: Payer: Medicare Other | Admitting: Orthopaedic Surgery

## 2020-06-05 DIAGNOSIS — J0100 Acute maxillary sinusitis, unspecified: Secondary | ICD-10-CM | POA: Diagnosis not present

## 2020-06-05 DIAGNOSIS — R05 Cough: Secondary | ICD-10-CM | POA: Diagnosis not present

## 2020-06-05 NOTE — Telephone Encounter (Signed)
Order faxed to [facility]

## 2020-06-09 ENCOUNTER — Other Ambulatory Visit: Payer: Self-pay | Admitting: Cardiology

## 2020-06-12 DIAGNOSIS — J0191 Acute recurrent sinusitis, unspecified: Secondary | ICD-10-CM | POA: Diagnosis not present

## 2020-06-23 DIAGNOSIS — K2100 Gastro-esophageal reflux disease with esophagitis, without bleeding: Secondary | ICD-10-CM | POA: Diagnosis not present

## 2020-07-21 DIAGNOSIS — H04123 Dry eye syndrome of bilateral lacrimal glands: Secondary | ICD-10-CM | POA: Diagnosis not present

## 2020-07-21 DIAGNOSIS — H353131 Nonexudative age-related macular degeneration, bilateral, early dry stage: Secondary | ICD-10-CM | POA: Diagnosis not present

## 2020-07-21 DIAGNOSIS — Z961 Presence of intraocular lens: Secondary | ICD-10-CM | POA: Diagnosis not present

## 2020-07-21 DIAGNOSIS — H524 Presbyopia: Secondary | ICD-10-CM | POA: Diagnosis not present

## 2020-07-22 ENCOUNTER — Encounter: Payer: Self-pay | Admitting: Family Medicine

## 2020-07-22 ENCOUNTER — Encounter: Payer: Self-pay | Admitting: *Deleted

## 2020-07-22 ENCOUNTER — Ambulatory Visit (INDEPENDENT_AMBULATORY_CARE_PROVIDER_SITE_OTHER): Payer: Medicare Other | Admitting: Family Medicine

## 2020-07-22 ENCOUNTER — Telehealth: Payer: Self-pay | Admitting: Family Medicine

## 2020-07-22 ENCOUNTER — Telehealth: Payer: Self-pay | Admitting: Cardiology

## 2020-07-22 VITALS — BP 92/70 | HR 85 | Ht 63.0 in | Wt 203.6 lb

## 2020-07-22 DIAGNOSIS — I2510 Atherosclerotic heart disease of native coronary artery without angina pectoris: Secondary | ICD-10-CM

## 2020-07-22 DIAGNOSIS — R079 Chest pain, unspecified: Secondary | ICD-10-CM | POA: Diagnosis not present

## 2020-07-22 DIAGNOSIS — I10 Essential (primary) hypertension: Secondary | ICD-10-CM

## 2020-07-22 DIAGNOSIS — R1010 Upper abdominal pain, unspecified: Secondary | ICD-10-CM | POA: Diagnosis not present

## 2020-07-22 DIAGNOSIS — E782 Mixed hyperlipidemia: Secondary | ICD-10-CM | POA: Diagnosis not present

## 2020-07-22 DIAGNOSIS — R0602 Shortness of breath: Secondary | ICD-10-CM

## 2020-07-22 DIAGNOSIS — Z8679 Personal history of other diseases of the circulatory system: Secondary | ICD-10-CM | POA: Diagnosis not present

## 2020-07-22 NOTE — Patient Instructions (Signed)
Medication Instructions:  Continue all current medications.  Labwork: none  Testing/Procedures:  Your physician has requested that you have an echocardiogram. Echocardiography is a painless test that uses sound waves to create images of your heart. It provides your doctor with information about the size and shape of your heart and how well your heart's chambers and valves are working. This procedure takes approximately one hour. There are no restrictions for this procedure.  Your physician has requested that you have a lexiscan myoview. For further information please visit [email or web address]. Please follow instruction sheet, as given.  Office will contact with results via phone or letter.    Follow-Up: 6 weeks   Any Other Special Instructions Will Be Listed Below (If Applicable).  If you need a refill on your cardiac medications before your next appointment, please call your pharmacy.

## 2020-07-22 NOTE — Telephone Encounter (Signed)
Error

## 2020-07-22 NOTE — Progress Notes (Signed)
Cardiology Office Note  Date: 07/22/2020   ID: Michelle Zavala, DOB 19-Dec-1945, MRN [id number]  PCP:  Neale Burly, MD  Cardiologist:  Rozann Lesches, MD Electrophysiologist:  None   Chief Complaint: Chest pain on and off since October.  Also taking atorvastatin prescribed by PCP.  Asking if she needs to be taking it.    History of Present Illness: Michelle Zavala is a 74 y.o. female with a history of PSVT, CAD, HTN, COPD.[name]  Last encounter with Dr. Domenic Polite 10/05/2019.  She did not report any anginal symptoms or palpitations.  Reported lack of energy and initiated.  Taking Zoloft for depression.  She had lost 2 of her children and prior year.  She was continuing Toprol-XL and omega-3 supplements.  EKG was reviewed showing sinus rhythm with borderline prolonged PR intervals and sinus arrhythmia.  She was continuing HCTZ with potassium supplements.  Mild CAD per previous work-up.  Myoview study May 2019 low risk.  She is here today with complaints of some chest pain on and off since October.  States she has been under a lot of stress lately due to the death of her friend.  She had 2 children passed away in the last year or so.  She states the chest pain originates below her left breast and radiates to mid chest.  She denies any radiation to the neck, arm, back, or jaw.  States she has almost daily nausea but it is not associated with the chest pain.  She states the chest pain can occur with or without activity.  Also complaining of increased dyspnea on exertion over the last 2 months.  States the is associated with exertion.  States the dyspnea is not associated with the chest pain.  Also complaining of dizziness off and on but no syncope or near syncopal episodes.  Denies any CVA or TIA-like symptoms.  No PND or orthopnea.  No bleeding.  No claudication-like symptoms, DVT or PE-like symptoms, or lower extremity edema.  States she saw her PCP today.  She was recently placed on atorvastatin    Past  Medical History:  Diagnosis Date  . Anxiety   . Arthritis   . C2 cervical fracture ([place]) 09/17/13   Traumatic fracture witth minimal displacement  . Chronic lung disease    Fibrosis - Dr. Koleen Nimrod  . COPD (chronic obstructive pulmonary disease) ([place])   . Coronary atherosclerosis of native coronary artery    Mild atherosclerosis 3/10, LVEF 60-65%  . Depression   . Diverticulosis   . Essential hypertension   . GERD (gastroesophageal reflux disease)   . PSVT (paroxysmal supraventricular tachycardia) ([place])     Past Surgical History:  Procedure Laterality Date  . ABDOMINAL HYSTERECTOMY    . ANTERIOR RELEASE VERTEBRAL BODY W/ POSTERIOR FUSION    . BACTERIAL OVERGROWTH TEST N/A 02/19/2016   Procedure: BACTERIAL OVERGROWTH TEST;  Surgeon: Daneil Dolin, MD;  [facility];  Service: Endoscopy;  Laterality: N/A;  0700  . BIOPSY N/A 08/04/2015   Procedure: BIOPSY;  Surgeon: Daneil Dolin, MD;  Location: [facility];  Service: Endoscopy;  Laterality: N/A;  Gastric  . BIOPSY  02/21/2017   Procedure: BIOPSY;  Surgeon: Daneil Dolin, MD;  [facility];  Service: Endoscopy;;  ascending colon biopsy   . cataract surgery    . CESAREAN SECTION    . CHOLECYSTECTOMY  1973  . COLONOSCOPY  June 2016   Dr. Britta Mccreedy: moderate diverticulosis in sigmoid colon, surveillance  in 5 years   . COLONOSCOPY WITH PROPOFOL N/A 02/21/2017   Dr. Gala Romney: diverticulosis in sigmoid colon, tubular adenomas, segmental biopsies benign. Surveillance in 5 years   . ESOPHAGOGASTRODUODENOSCOPY (EGD) WITH PROPOFOL N/A 08/04/2015   Dr. Rourk:abnormal gastric mucosa s/p biopsy. Reactive gastropathy. Negative H.pylori  . ESOPHAGOGASTRODUODENOSCOPY (EGD) WITH PROPOFOL N/A 02/21/2017   Dr. Gala Romney: empiric esophageal dilatation, small hiatal hernia, otherwise normal  . INCISIONAL HERNIA REPAIR N/A 12/03/2015   Procedure: [name] HERNIORRHAPHY WITH MESH;  Surgeon: Aviva Signs, MD;  Location: [facility];  Service: General;   Laterality: N/A;  . INSERTION OF MESH  12/03/2015   Procedure: INSERTION OF MESH;  Surgeon: Aviva Signs, MD;  Location: [facility];  Service: General;;  . IR RADIOLOGIST EVAL & MGMT  09/22/2017  . MALONEY DILATION N/A 02/21/2017   Procedure: [name] DILATION;  Surgeon: Daneil Dolin, MD;  [facility];  Service: Endoscopy;  Laterality: N/A;  . POLYPECTOMY  02/21/2017   Procedure: POLYPECTOMY;  Surgeon: Daneil Dolin, MD;  [facility];  Service: Endoscopy;;  hepatic flexure polyp cs  . TMJ ARTHROSCOPY Bilateral 02/04/2015   Procedure: BILATERAL TEMPOROMANDIBULAR JOINT (TMJ) ARTHROSCOPY MENISECTOMY WITH FAT GRAFT FROM ABDOMEN ;  Surgeon: Diona Browner, DDS;  Location: [facility];  Service: Oral Surgery;  Laterality: Bilateral;  . TOTAL KNEE ARTHROPLASTY Bilateral   . TUBAL LIGATION      Current Outpatient Medications  Medication Sig Dispense Refill  . acetaminophen (TYLENOL) 650 MG CR tablet Take 1,300 mg by mouth at bedtime as needed for pain.     [name] albuterol (PROAIR HFA) 108 (90 Base) MCG/ACT inhaler Inhale 2 puffs into the lungs every 4 (four) hours as needed for wheezing or shortness of breath.    [name] amitriptyline (ELAVIL) 75 MG tablet Take 75 mg by mouth at bedtime.  6  . Biotin w/ Vitamins C & E (HAIR/SKIN/NAILS PO) Take 1 tablet by mouth daily.     . fluticasone (FLONASE) 50 MCG/ACT nasal spray Place 2 sprays into both nostrils daily as needed for allergies or rhinitis.    . hydrochlorothiazide (HYDRODIURIL) 25 MG tablet Take 25 mg by mouth every morning.    [name] ipratropium-albuterol (DUONEB) 0.5-2.5 (3) MG/3ML SOLN Take 3 mLs by nebulization every 4 (four) hours as needed.    [name] LORazepam (ATIVAN) 1 MG tablet Take 1 mg by mouth 2 (two) times daily.    . metoprolol succinate (TOPROL-XL) 50 MG 24 hr tablet TAKE ONE TABLET BY MOUTH TWICE DAILY 180 tablet 1  . Multiple Vitamin (MULTIVITAMIN PO) Take by mouth daily.    . Multiple Vitamins-Minerals (PRESERVISION AREDS PO) Take 1  tablet by mouth daily.    . Omega-3 Fatty Acids (FISH OIL) 1000 MG CAPS Take 1 capsule by mouth daily.     . pantoprazole (PROTONIX) 40 MG tablet Take 1 tablet (40 mg total) by mouth 2 (two) times daily. 60 tablet 1  . potassium chloride SA (KLOR-CON) 20 MEQ tablet Take 20 mEq by mouth daily.    . sertraline (ZOLOFT) 100 MG tablet Take 150 mg by mouth daily.     [name] umeclidinium-vilanterol (ANORO ELLIPTA) 62.5-25 MCG/INH AEPB Inhale 1 puff into the lungs daily.    . vitamin B-12 (CYANOCOBALAMIN) 1000 MCG tablet Take 1,000 mcg by mouth daily.    . Vitamin D, Ergocalciferol, (DRISDOL) 50000 units CAPS capsule Take 50,000 Units by mouth every 7 (seven) days. Saturday     No current facility-administered medications for this  visit.   Allergies:  Sulfonamide derivatives   Social History: The patient  reports that she has never smoked. She has never used smokeless tobacco. She reports that she does not drink alcohol and does not use drugs.   Family History: The patient's family history includes Colon cancer in her son; Coronary artery disease in her brother, father, and sister; Heart disease in her father.   ROS:  Please see the history of present illness. Otherwise, complete review of systems is positive for none.  All other systems are reviewed and negative.   Physical Exam: VS:  BP 92/70   Pulse 85   Ht 5\' 3"  (1.6 m)   Wt 203 lb 9.6 oz (92.4 kg)   SpO2 98%   BMI 36.07 kg/m , BMI Body mass index is 36.07 kg/m.  Wt Readings from Last 3 Encounters:  07/22/20 203 lb 9.6 oz (92.4 kg)  05/08/20 210 lb (95.3 kg)  01/17/20 203 lb (92.1 kg)    General: Obese patient appears comfortable at rest. Neck: Supple, no elevated JVP or carotid bruits, no thyromegaly. Lungs: Clear to auscultation, nonlabored breathing at rest. Cardiac: Regular rate and rhythm, no S3 or significant systolic murmur, no pericardial rub. Extremities: No pitting edema, distal pulses 2+. Skin: Warm and  dry. Musculoskeletal: No kyphosis. Neuropsychiatric: Alert and oriented x3, affect grossly appropriate.  ECG:  An ECG dated 07/22/2020 was personally reviewed today and demonstrated:  Normal sinus rhythm rate of 86, left axis deviation, possible anterolateral infarct, age undetermined.  Recent Labwork: 09/27/2019: Creatinine, Ser 1.10  No results found for: CHOL, TRIG, HDL, CHOLHDL, VLDL, LDLCALC, LDLDIRECT  Other Studies Reviewed Today:   Assessment and Plan:  1. Chest pain, unspecified type   2. History of PSVT (paroxysmal supraventricular tachycardia)   3. Essential hypertension, benign   4. CAD in native artery   5. Mixed hyperlipidemia     1. Chest pain, unspecified type Complaining of chest pain on and off since October.  States pain originates under her left breast and radiates to mid sternum.  States the pain usually occurs at rest.  States she has some associated nausea and some shortness of breath.  She states the shortness of breath can occur with and without the onset of chest pain.  He is also been complaining of some recent nausea and has an abdominal ultrasound ordered per her statement.  Please get a Lexiscan stress test to rule out ischemia.  2. History of PSVT (paroxysmal supraventricular tachycardia) States she has some occasional palpitations but nothing long lasting.  Has some dizziness off and on which may or may not be associated with palpitations.  States the dizziness can occur at almost any time.  Continue Toprol-XL 50 mg p.o. twice daily.  3. Essential hypertension, benign Blood pressure initially 92/70.  Recheck in right arm was 120/80.  Continue HCTZ 25 mg daily.  Continue Toprol-XL 50 mg p.o. twice daily.  4. CAD in native artery Had mild coronary atherosclerosis via previous work-up.  Low risk stress test 2019.  We are ordering a repeat Lexiscan as noted above in #1 due to recent complaints of chest pain.  5. Mixed hyperlipidemia Patient states she was  recently prescribed atorvastatin by PCP.  She is concerned as to whether she should be taking the medication.  States she recently had some lab work and was subsequently called and told she needed to start taking the atorvastatin.  Continue atorvastatin 20 mg daily as prescribed by PCP.  6.  SOB (shortness of breath) Complaining of increased dyspnea on exertion over the last 2 months.  States she is under a lot of stress.  She had a good friend die as well as 2 of her children over the last 2 years.  She is unsure if the shortness of breath is associated with stress.  States it seems to have gotten worse over the last couple of months.  Medication Adjustments/Labs and Tests Ordered: Current medicines are reviewed at length with the patient today.  Concerns regarding medicines are outlined above.   Disposition: Follow-up with Dr. Domenic Polite or APP 6 weeks  Signed, Levell July, NP 07/22/2020 3:21 PM    [facility] [place], [place], [place] [address] Phone: [phone]; Fax: [phone] is questioning as to whether she should be on the medication or not.

## 2020-07-22 NOTE — Telephone Encounter (Signed)
Pre-cert Verification for the following procedure    LEXISCAN   DATE:   07/28/2020  LOCATION: [facility]

## 2020-07-24 ENCOUNTER — Ambulatory Visit (INDEPENDENT_AMBULATORY_CARE_PROVIDER_SITE_OTHER): Payer: Medicare Other

## 2020-07-24 ENCOUNTER — Other Ambulatory Visit: Payer: Self-pay

## 2020-07-24 DIAGNOSIS — R0602 Shortness of breath: Secondary | ICD-10-CM

## 2020-07-24 LAB — ECHOCARDIOGRAM COMPLETE
Area-P 1/2: 3.27 cm2
P 1/2 time: 670 msec
S' Lateral: 3.84 cm

## 2020-07-24 NOTE — Addendum Note (Signed)
Addended by: Julian Hy T on: 07/24/2020 08:05 AM   Modules accepted: Orders

## 2020-07-28 ENCOUNTER — Other Ambulatory Visit: Payer: Self-pay

## 2020-07-28 ENCOUNTER — Telehealth: Payer: Self-pay | Admitting: Internal Medicine

## 2020-07-28 ENCOUNTER — Encounter (HOSPITAL_BASED_OUTPATIENT_CLINIC_OR_DEPARTMENT_OTHER)
Admission: RE | Admit: 2020-07-28 | Discharge: 2020-07-28 | Disposition: A | Discharge status: Home / Self Care | Payer: Medicare Other | Source: Ambulatory Visit | Attending: Family Medicine | Admitting: Family Medicine

## 2020-07-28 ENCOUNTER — Telehealth: Payer: Self-pay | Admitting: *Deleted

## 2020-07-28 ENCOUNTER — Encounter (HOSPITAL_COMMUNITY)
Admission: RE | Admit: 2020-07-28 | Discharge: 2020-07-28 | Disposition: A | Discharge status: Home / Self Care | Payer: Medicare Other | Source: Ambulatory Visit | Attending: Family Medicine | Admitting: Family Medicine

## 2020-07-28 DIAGNOSIS — R079 Chest pain, unspecified: Secondary | ICD-10-CM | POA: Diagnosis not present

## 2020-07-28 LAB — NM MYOCAR MULTI W/SPECT W/WALL MOTION / EF
LV dias vol: 76 mL (ref 46–106)
LV sys vol: 34 mL
Peak HR: 80 {beats}/min
RATE: 0.38
Rest HR: 71 {beats}/min
SDS: 1
SRS: 4
SSS: 4
TID: 1.13

## 2020-07-28 MED ORDER — REGADENOSON 0.4 MG/5ML IV SOLN
INTRAVENOUS | Status: AC
  Administered 2020-07-28: 0.4 mg via INTRAVENOUS
  Filled 2020-07-28: qty 5

## 2020-07-28 MED ORDER — TECHNETIUM TC 99M TETROFOSMIN IV KIT
30.0000 | PACK | Freq: Once | INTRAVENOUS | Status: AC | PRN
  Administered 2020-07-28: 30 via INTRAVENOUS

## 2020-07-28 MED ORDER — SODIUM CHLORIDE FLUSH 0.9 % IV SOLN
INTRAVENOUS | Status: AC
  Administered 2020-07-28: 10 mL via INTRAVENOUS
  Filled 2020-07-28: qty 10

## 2020-07-28 MED ORDER — TECHNETIUM TC 99M TETROFOSMIN IV KIT
10.0000 | PACK | Freq: Once | INTRAVENOUS | Status: AC | PRN
  Administered 2020-07-28: 11 via INTRAVENOUS

## 2020-07-28 NOTE — Telephone Encounter (Signed)
History of chronic abdominal pain. Make sure she has added back Benefiber and takes consistently. Gallbladder absent. Make sure taking PPI BID. Probably need to get her in sooner for an appointment, as we haven't seen her since May 2021.

## 2020-07-28 NOTE — Telephone Encounter (Signed)
Lmom, waiting on a return call.  

## 2020-07-28 NOTE — Telephone Encounter (Signed)
Spoke with pt. Pt is having nausea without vomiting after eating daily, mid abdominal pain that wraps around pts side and some constipation. Pt is taking an otc medication that isn't helping the nausea. Pt was asked if she has any vomiting and pt says she only gets nauseated. Pt is having a BM q 2 days and when stool comes out it is hard. Pt will get occasional loose stool but reports only hard stool over the last few weeks. Pt was off of benefiber for several weeks and started back last week.

## 2020-07-28 NOTE — Telephone Encounter (Signed)
Laurine Blazer, LPN  23/76/2831 5:17 PM EST Back to Top    Notified, copy to pcp.

## 2020-07-28 NOTE — Telephone Encounter (Signed)
-----   Message from Verta Ellen., NP sent at 07/25/2020  9:41 AM EST ----- Please call the patient let her know the pumping function was in the low normal range.  Main pumping chamber is a little more muscular.  The key to the managing this is to make sure the blood pressure stays at or below 130/80 and managing all other risk factors.  Has some very trivial to mild valve leaking.  This is a normal finding as we age.  Thank you

## 2020-07-28 NOTE — Telephone Encounter (Signed)
[phone]  PATIENT CAME INTO THE OFFICE ASKING DR Gala Romney TO SCHEDULE HER AN ULTRASOUND. STATED HER STOMACH BOTHERS HER AND SOMETHING NEEDS TO BE DONE TO FIND OUT WHAT IS GOING ON

## 2020-07-29 ENCOUNTER — Telehealth: Payer: Self-pay | Admitting: Internal Medicine

## 2020-07-29 DIAGNOSIS — Z9049 Acquired absence of other specified parts of digestive tract: Secondary | ICD-10-CM | POA: Diagnosis not present

## 2020-07-29 DIAGNOSIS — R1011 Right upper quadrant pain: Secondary | ICD-10-CM | POA: Diagnosis not present

## 2020-07-29 DIAGNOSIS — R1084 Generalized abdominal pain: Secondary | ICD-10-CM | POA: Diagnosis not present

## 2020-07-29 NOTE — Telephone Encounter (Signed)
Lmom, waiting on a return call.  

## 2020-07-29 NOTE — Telephone Encounter (Signed)
Patient returned call, please call when you can

## 2020-07-29 NOTE — Telephone Encounter (Signed)
Spoke with pt. Pt was advised to continue Benefiber without missing a dose of medication, continue Protonix 40 mg 30 mins before breakfast and 30 mins before dinner. Pt was scheduled for 07/30/20 with Roseanne Kaufman.

## 2020-07-30 ENCOUNTER — Other Ambulatory Visit: Payer: Self-pay

## 2020-07-30 ENCOUNTER — Ambulatory Visit (INDEPENDENT_AMBULATORY_CARE_PROVIDER_SITE_OTHER): Payer: Medicare Other | Admitting: Gastroenterology

## 2020-07-30 ENCOUNTER — Encounter: Payer: Self-pay | Admitting: *Deleted

## 2020-07-30 ENCOUNTER — Telehealth: Payer: Self-pay | Admitting: *Deleted

## 2020-07-30 ENCOUNTER — Encounter: Payer: Self-pay | Admitting: Gastroenterology

## 2020-07-30 VITALS — BP 115/73 | HR 94 | Temp 96.8°F | Ht 63.0 in | Wt 199.6 lb

## 2020-07-30 DIAGNOSIS — R1310 Dysphagia, unspecified: Secondary | ICD-10-CM | POA: Diagnosis not present

## 2020-07-30 DIAGNOSIS — R1013 Epigastric pain: Secondary | ICD-10-CM

## 2020-07-30 MED ORDER — DICYCLOMINE HCL 10 MG PO CAPS: 10.0000 mg | ORAL_CAPSULE | Freq: Three times a day (TID) | ORAL | 3 refills | Status: DC

## 2020-07-30 NOTE — Telephone Encounter (Signed)
Laurine Blazer, LPN  81/09/7508 2:58 PM EST Back to Top    Notified, copy to pcp.

## 2020-07-30 NOTE — Patient Instructions (Signed)
Please have blood work done today.  For days you have loose stool, you can take dicyclomine (Bentyl) before meals up to 4 times per day. I would limit this to twice per day for now only if loose stool. This medication can cause dry mouth, confusion, and constipation.  We are arranging an upper endoscopy with dilation by Dr. Gala Romney in the near future!  Further recommendations to follow!  I am so sorry for your loss!  I enjoyed seeing you again today! As you know, I value our relationship and want to provide genuine, compassionate, and quality care. I welcome your feedback. If you receive a survey regarding your visit,  I greatly appreciate you taking time to fill this out. See you next time!  Annitta Needs, PhD, ANP-BC [facility]

## 2020-07-30 NOTE — H&P (View-Only) (Signed)
Referring Provider: Neale Burly, MD Primary Care Physician:  Neale Burly, MD  Primary GI: Dr. Gala Romney  Chief Complaint  Patient presents with  . Abdominal Pain  . loose stool    with mucous    HPI:   Michelle Zavala is a 74 y.o. female presenting today with a history of chronic abdominal pain,diarrhea, dysphagia. Prior extensive evaluation listed below. Here today for worsening abdominal pain. Last CT abd/pelvis with contrast Jan 2021: no acute findings.    Upper abdominal pain/bilateral pain. Worse with eating. No appetite. Had been slowing down on eating before significant other died.   Lost her significant other Oct 2nd. Pain was present prior to this. States she feels pain may be related to nerves  Sometimes can't make it to the bathroom. Slimy/snot stool. Having looser stool the last few days. Was more constipated when called in a few days ago. Today: no BMs.  Had been taking fiber but stopped. No rectal bleeding. Vague reports of dysphagia. Improvement with dilation in the past. Dysphagia with chicken. Protonix BID controlled reflux symptoms. No NSAIDs.   Past Medical History:  Diagnosis Date  . Anxiety   . Arthritis   . C2 cervical fracture ([place]) 09/17/13   Traumatic fracture witth minimal displacement  . Chronic lung disease    Fibrosis - Dr. Koleen Nimrod  . COPD (chronic obstructive pulmonary disease) ([place])   . Coronary atherosclerosis of native coronary artery    Mild atherosclerosis 3/10, LVEF 60-65%  . Depression   . Diverticulosis   . Essential hypertension   . GERD (gastroesophageal reflux disease)   . PSVT (paroxysmal supraventricular tachycardia) ([place])     Past Surgical History:  Procedure Laterality Date  . ABDOMINAL HYSTERECTOMY    . ANTERIOR RELEASE VERTEBRAL BODY W/ POSTERIOR FUSION    . BACTERIAL OVERGROWTH TEST N/A 02/19/2016   Procedure: BACTERIAL OVERGROWTH TEST;  Surgeon: Daneil Dolin, MD;  [facility];  Service: Endoscopy;   Laterality: N/A;  0700  . BIOPSY N/A 08/04/2015   Procedure: BIOPSY;  Surgeon: Daneil Dolin, MD;  [facility];  Service: Endoscopy;  Laterality: N/A;  Gastric  . BIOPSY  02/21/2017   Procedure: BIOPSY;  Surgeon: Daneil Dolin, MD;  [facility];  Service: Endoscopy;;  ascending colon biopsy   . cataract surgery    . CESAREAN SECTION    . CHOLECYSTECTOMY  1973  . COLONOSCOPY  June 2016   Dr. Britta Mccreedy: moderate diverticulosis in sigmoid colon, surveillance in 5 years   . COLONOSCOPY WITH PROPOFOL N/A 02/21/2017   Dr. Gala Romney: diverticulosis in sigmoid colon, tubular adenomas, segmental biopsies benign. Surveillance in 5 years   . ESOPHAGOGASTRODUODENOSCOPY (EGD) WITH PROPOFOL N/A 08/04/2015   Dr. Rourk:abnormal gastric mucosa s/p biopsy. Reactive gastropathy. Negative H.pylori  . ESOPHAGOGASTRODUODENOSCOPY (EGD) WITH PROPOFOL N/A 02/21/2017   Dr. Gala Romney: empiric esophageal dilatation, small hiatal hernia, otherwise normal  . INCISIONAL HERNIA REPAIR N/A 12/03/2015   Procedure: [name] HERNIORRHAPHY WITH MESH;  Surgeon: Aviva Signs, MD;  [facility];  Service: General;  Laterality: N/A;  . INSERTION OF MESH  12/03/2015   Procedure: INSERTION OF MESH;  Surgeon: Aviva Signs, MD;  [facility];  Service: General;;  . IR RADIOLOGIST EVAL & MGMT  09/22/2017  . MALONEY DILATION N/A 02/21/2017   Procedure: [name] DILATION;  Surgeon: Daneil Dolin, MD;  [facility];  Service: Endoscopy;  Laterality: N/A;  . POLYPECTOMY  02/21/2017   Procedure: POLYPECTOMY;  Surgeon: Daneil Dolin, MD;  [facility];  Service: Endoscopy;;  hepatic flexure polyp cs  . TMJ ARTHROSCOPY Bilateral 02/04/2015   Procedure: BILATERAL TEMPOROMANDIBULAR JOINT (TMJ) ARTHROSCOPY MENISECTOMY WITH FAT GRAFT FROM ABDOMEN ;  Surgeon: Diona Browner, DDS;  Location: [place];  Service: Oral Surgery;  Laterality: Bilateral;  . TOTAL KNEE ARTHROPLASTY Bilateral   . TUBAL LIGATION       Current Outpatient Medications  Medication Sig Dispense Refill  . acetaminophen (TYLENOL) 650 MG CR tablet Take 1,300 mg by mouth at bedtime as needed for pain.     [name] albuterol (PROAIR HFA) 108 (90 Base) MCG/ACT inhaler Inhale 2 puffs into the lungs every 4 (four) hours as needed for wheezing or shortness of breath.    [name] amitriptyline (ELAVIL) 75 MG tablet Take 75 mg by mouth at bedtime.  6  . fluticasone (FLONASE) 50 MCG/ACT nasal spray Place 2 sprays into both nostrils daily as needed for allergies or rhinitis.    . hydrochlorothiazide (HYDRODIURIL) 25 MG tablet Take 25 mg by mouth every morning.    [name] ipratropium-albuterol (DUONEB) 0.5-2.5 (3) MG/3ML SOLN Take 3 mLs by nebulization every 4 (four) hours as needed.    [name] LORazepam (ATIVAN) 1 MG tablet Take 1 mg by mouth 2 (two) times daily.    . Multiple Vitamin (MULTIVITAMIN PO) Take by mouth daily.    . Multiple Vitamins-Minerals (PRESERVISION AREDS PO) Take 1 tablet by mouth daily.    . Omega-3 Fatty Acids (FISH OIL) 1000 MG CAPS Take 1 capsule by mouth daily.     . pantoprazole (PROTONIX) 40 MG tablet Take 1 tablet (40 mg total) by mouth 2 (two) times daily. 60 tablet 1  . potassium chloride SA (KLOR-CON) 20 MEQ tablet Take 20 mEq by mouth daily.    . sertraline (ZOLOFT) 100 MG tablet Take 150 mg by mouth daily.     [name] umeclidinium-vilanterol (ANORO ELLIPTA) 62.5-25 MCG/INH AEPB Inhale 1 puff into the lungs daily.    . vitamin B-12 (CYANOCOBALAMIN) 1000 MCG tablet Take 1,000 mcg by mouth daily.    . Vitamin D, Ergocalciferol, (DRISDOL) 50000 units CAPS capsule Take 50,000 Units by mouth every 7 (seven) days. Saturday    . dicyclomine (BENTYL) 10 MG capsule Take 1 capsule (10 mg total) by mouth 4 (four) times daily -  before meals and at bedtime. For abdominal cramping and loose stool 120 capsule 3   No current facility-administered medications for this visit.    Allergies as of 07/30/2020 - Review Complete 07/30/2020  Allergen  Reaction Noted  . Sulfonamide derivatives Hives     Family History  Problem Relation Age of Onset  . Coronary artery disease Father        Premature  . Heart disease Father   . Coronary artery disease Brother        Premature  . Coronary artery disease Sister        Premature  . Colon cancer Son     Social History   Socioeconomic History  . Marital status: Widowed    Spouse name: Not on file  . Number of children: 3  . Years of education: 7  . Highest education level: Not on file  Occupational History  . Occupation: Retired  Tobacco Use  . Smoking status: Never Smoker  . Smokeless tobacco: Never Used  Vaping Use  . Vaping Use: Never used  Substance and Sexual Activity  . Alcohol  use: No    Alcohol/week: 0.0 standard drinks  . Drug use: No  . Sexual activity: Never    Birth control/protection: None, Surgical  Other Topics Concern  . Not on file  Social History Narrative   Occasionally drinks [employer]epsi    Social Determinants of Health   Financial Resource Strain:   . Difficulty of Paying Living Expenses: Not on file  Food Insecurity:   . Worried About Charity fundraiser in the Last Year: Not on file  . Ran Out of Food in the Last Year: Not on file  Transportation Zavala:   . Lack of Transportation (Medical): Not on file  . Lack of Transportation (Non-Medical): Not on file  Physical Activity:   . Days of Exercise per Week: Not on file  . Minutes of Exercise per Session: Not on file  Stress:   . Feeling of Stress : Not on file  Social Connections:   . Frequency of Communication with Friends and Family: Not on file  . Frequency of Social Gatherings with Friends and Family: Not on file  . Attends Religious Services: Not on file  . Active Member of Clubs or Organizations: Not on file  . Attends Archivist Meetings: Not on file  . Marital Status: Not on file    Review of Systems: Gen: Denies fever, chills, anorexia. Denies fatigue, weakness, weight  loss.  CV: Denies chest pain, palpitations, syncope, peripheral edema, and claudication. Resp: Denies dyspnea at rest, cough, wheezing, coughing up blood, and pleurisy. GI:see HPI Derm: Denies rash, itching, dry skin Psych: see HPI Heme: Denies bruising, bleeding, and enlarged lymph nodes.   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 Progression of SMA disease could cause issues, but clinical monitoring recommended per IR. She was seen by Vascular Surgery due to renal artery disease as well. Medical therapy recommended. Symptomatic fat-containing ventral hernia noted at visit in March 2017, undergoing hernia repair in March 2017.TCS/EGD with negative segmental colonic biopsies, needing surveillance in 5 years due to colonic adenomas. EGD with empiric dilatation, small hiatal hernia. Celiac serologies negative. 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 Negative hydrogen breath test for small bowel bacterial overgrowth.CTA updated 08/24/17: mixed plaque at origin of SMA but not felt to be greater than 50%. Celiac and IMA remained patent. Discussed with Dr. Earleen Newport, who recommended IR outpatient clinic evaluation. She was not felt to have chronic mesenteric ischemia.  Physical Exam: BP 115/73   Pulse 94   Temp (!) 96.8 F (36 C) (Temporal)   Ht 5\' 3"  (1.6 m)   Wt 199 lb 9.6 oz (90.5 kg)   BMI 35.36 kg/m  General:   Alert and oriented. No distress noted. Pleasant and cooperative.  Head:  Normocephalic and atraumatic. Eyes:  Conjuctiva clear without scleral icterus. Mouth:  Oral mucosa pink and moist. Good dentition. No lesions. Abdomen:  +BS, soft, mild TTP epigastric  and non-distended. No rebound or guarding. No HSM or masses noted. Msk:  Symmetrical without gross deformities. Normal posture. Extremities:  Without edema. Neurologic:  Alert and  oriented x4 Psych:  Alert and cooperative. Normal mood and  affect.  ASSESSMENT: Eliany Mccarter is a 74 y.o. female presenting today with history of chronic abdominal pain, now worsening in setting of grief/stress (lost her significant other October 2nd), but states pain prior to this. Endorses possible component of "nerves". However, she also notes solid food  dysphagia that is recurrent, with noted improvement s/p dilation in the past.   Will pursue EGD/dilation in the near future. Protonix BID is currently controlling reflux symptoms.  Will also add Bentyl due to component of IBS.     PLAN:   Check CBC, CMP, lipase today  Proceed with upper endoscopy/dilation by Dr. Gala Romney in near future with Propofol: the risks, benefits, and alternatives have been discussed with the patient in detail. The patient states understanding and desires to proceed.   Continue Protonix BID   Michelle Needs, PhD, [facility] Gastroenterology

## 2020-07-30 NOTE — Progress Notes (Signed)
Referring Provider: Neale Burly, MD Primary Care Physician:  Neale Burly, MD  Primary GI: Dr. Gala Romney  Chief Complaint  Patient presents with   Abdominal Pain   loose stool    with mucous    HPI:   Michelle Zavala is a 74 y.o. female presenting today with a history of chronic abdominal pain,diarrhea, dysphagia. Prior extensive evaluation listed below. Here today for worsening abdominal pain. Last CT abd/pelvis with contrast Jan 2021: no acute findings.    Upper abdominal pain/bilateral pain. Worse with eating. No appetite. Had been slowing down on eating before significant other died.   Lost her significant other Oct 2nd. Pain was present prior to this. States she feels pain may be related to nerves  Sometimes can't make it to the bathroom. Slimy/snot stool. Having looser stool the last few days. Was more constipated when called in a few days ago. Today: no BMs.  Had been taking fiber but stopped. No rectal bleeding. Vague reports of dysphagia. Improvement with dilation in the past. Dysphagia with chicken. Protonix BID controlled reflux symptoms. No NSAIDs.   Past Medical History:  Diagnosis Date   Anxiety    Arthritis    C2 cervical fracture ([place]) 09/17/13   Traumatic fracture witth minimal displacement   Chronic lung disease    Fibrosis - Dr. Koleen Nimrod   COPD (chronic obstructive pulmonary disease) ([place])    Coronary atherosclerosis of native coronary artery    Mild atherosclerosis 3/10, LVEF 60-65%   Depression    Diverticulosis    Essential hypertension    GERD (gastroesophageal reflux disease)    PSVT (paroxysmal supraventricular tachycardia) ([place])     Past Surgical History:  Procedure Laterality Date   ABDOMINAL HYSTERECTOMY     ANTERIOR RELEASE VERTEBRAL BODY W/ POSTERIOR FUSION     BACTERIAL OVERGROWTH TEST N/A 02/19/2016   Procedure: BACTERIAL OVERGROWTH TEST;  Surgeon: Daneil Dolin, MD;  [facility];  Service: Endoscopy;   Laterality: N/A;  0700   BIOPSY N/A 08/04/2015   Procedure: BIOPSY;  Surgeon: Daneil Dolin, MD;  [facility];  Service: Endoscopy;  Laterality: N/A;  Gastric   BIOPSY  02/21/2017   Procedure: BIOPSY;  Surgeon: Daneil Dolin, MD;  [facility];  Service: Endoscopy;;  ascending colon biopsy    cataract surgery     [place]   COLONOSCOPY  June 2016   Dr. Britta Mccreedy: moderate diverticulosis in sigmoid colon, surveillance in 5 years    COLONOSCOPY WITH PROPOFOL N/A 02/21/2017   Dr. Gala Romney: diverticulosis in sigmoid colon, tubular adenomas, segmental biopsies benign. Surveillance in 5 years    ESOPHAGOGASTRODUODENOSCOPY (EGD) WITH PROPOFOL N/A 08/04/2015   Dr. Rourk:abnormal gastric mucosa s/p biopsy. Reactive gastropathy. Negative H.pylori   ESOPHAGOGASTRODUODENOSCOPY (EGD) WITH PROPOFOL N/A 02/21/2017   Dr. Gala Romney: empiric esophageal dilatation, small hiatal hernia, otherwise normal   INCISIONAL HERNIA REPAIR N/A 12/03/2015   Procedure: [name] HERNIORRHAPHY WITH MESH;  Surgeon: Aviva Signs, MD;  [facility];  Service: General;  Laterality: N/A;   INSERTION OF MESH  12/03/2015   Procedure: INSERTION OF MESH;  Surgeon: Aviva Signs, MD;  [facility];  Service: General;;   IR RADIOLOGIST EVAL & MGMT  09/22/2017   MALONEY DILATION N/A 02/21/2017   Procedure: [name] DILATION;  Surgeon: Daneil Dolin, MD;  [facility];  Service: Endoscopy;  Laterality: N/A;   POLYPECTOMY  02/21/2017   Procedure: POLYPECTOMY;  Surgeon: Daneil Dolin, MD;  [facility];  Service: Endoscopy;;  hepatic flexure polyp cs   TMJ ARTHROSCOPY Bilateral 02/04/2015   Procedure: BILATERAL TEMPOROMANDIBULAR JOINT (TMJ) ARTHROSCOPY MENISECTOMY WITH FAT GRAFT FROM ABDOMEN ;  Surgeon: Diona Browner, DDS;  Location: [place];  Service: Oral Surgery;  Laterality: Bilateral;   TOTAL KNEE ARTHROPLASTY Bilateral    TUBAL LIGATION       Current Outpatient Medications  Medication Sig Dispense Refill   acetaminophen (TYLENOL) 650 MG CR tablet Take 1,300 mg by mouth at bedtime as needed for pain.      albuterol (PROAIR HFA) 108 (90 Base) MCG/ACT inhaler Inhale 2 puffs into the lungs every 4 (four) hours as needed for wheezing or shortness of breath.     amitriptyline (ELAVIL) 75 MG tablet Take 75 mg by mouth at bedtime.  6   fluticasone (FLONASE) 50 MCG/ACT nasal spray Place 2 sprays into both nostrils daily as needed for allergies or rhinitis.     hydrochlorothiazide (HYDRODIURIL) 25 MG tablet Take 25 mg by mouth every morning.     ipratropium-albuterol (DUONEB) 0.5-2.5 (3) MG/3ML SOLN Take 3 mLs by nebulization every 4 (four) hours as needed.     LORazepam (ATIVAN) 1 MG tablet Take 1 mg by mouth 2 (two) times daily.     Multiple Vitamin (MULTIVITAMIN PO) Take by mouth daily.     Multiple Vitamins-Minerals (PRESERVISION AREDS PO) Take 1 tablet by mouth daily.     Omega-3 Fatty Acids (FISH OIL) 1000 MG CAPS Take 1 capsule by mouth daily.      pantoprazole (PROTONIX) 40 MG tablet Take 1 tablet (40 mg total) by mouth 2 (two) times daily. 60 tablet 1   potassium chloride SA (KLOR-CON) 20 MEQ tablet Take 20 mEq by mouth daily.     sertraline (ZOLOFT) 100 MG tablet Take 150 mg by mouth daily.      umeclidinium-vilanterol (ANORO ELLIPTA) 62.5-25 MCG/INH AEPB Inhale 1 puff into the lungs daily.     vitamin B-12 (CYANOCOBALAMIN) 1000 MCG tablet Take 1,000 mcg by mouth daily.     Vitamin D, Ergocalciferol, (DRISDOL) 50000 units CAPS capsule Take 50,000 Units by mouth every 7 (seven) days. Saturday     dicyclomine (BENTYL) 10 MG capsule Take 1 capsule (10 mg total) by mouth 4 (four) times daily -  before meals and at bedtime. For abdominal cramping and loose stool 120 capsule 3   No current facility-administered medications for this visit.    Allergies as of 07/30/2020 - Review Complete 07/30/2020  Allergen  Reaction Noted   Sulfonamide derivatives Hives     Family History  Problem Relation Age of Onset   Coronary artery disease Father        Premature   Heart disease Father    Coronary artery disease Brother        Premature   Coronary artery disease Sister        Premature   Colon cancer Son     Social History   Socioeconomic History   Marital status: Widowed    Spouse name: Not on file   Number of children: 3   Years of education: 7   Highest education level: Not on file  Occupational History   Occupation: Retired  Tobacco Use   Smoking status: Never Smoker   Smokeless tobacco: Never Used  Scientific laboratory technician Use: Never used  Substance and Sexual Activity   Alcohol  use: No    Alcohol/week: 0.0 standard drinks   Drug use: No   Sexual activity: Never    Birth control/protection: None, Surgical  Other Topics Concern   Not on file  Social History Narrative   Occasionally drinks Pepsi    Social Determinants of Health   Financial Resource Strain:    Difficulty of Paying Living Expenses: Not on file  Food Insecurity:    Worried About Charity fundraiser in the Last Year: Not on file   [employer] of Food in the Last Year: Not on file  Transportation Needs:    Lack of Transportation (Medical): Not on file   Lack of Transportation (Non-Medical): Not on file  Physical Activity:    Days of Exercise per Week: Not on file   Minutes of Exercise per Session: Not on file  Stress:    Feeling of Stress : Not on file  Social Connections:    Frequency of Communication with Friends and Family: Not on file   Frequency of Social Gatherings with Friends and Family: Not on file   Attends Religious Services: Not on file   Active Member of Clubs or Organizations: Not on file   Attends Archivist Meetings: Not on file   Marital Status: Not on file    Review of Systems: Gen: Denies fever, chills, anorexia. Denies fatigue, weakness, weight  loss.  CV: Denies chest pain, palpitations, syncope, peripheral edema, and claudication. Resp: Denies dyspnea at rest, cough, wheezing, coughing up blood, and pleurisy. GI:see HPI Derm: Denies rash, itching, dry skin Psych: see HPI Heme: Denies bruising, bleeding, and enlarged lymph nodes.   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 Progression of SMA disease could cause issues, but clinical monitoring recommended per IR. She was seen by Vascular Surgery due to renal artery disease as well. Medical therapy recommended. Symptomatic fat-containing ventral hernia noted at visit in March 2017, undergoing hernia repair in March 2017.TCS/EGD with negative segmental colonic biopsies, needing surveillance in 5 years due to colonic adenomas. EGD with empiric dilatation, small hiatal hernia. Celiac serologies negative. 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 Negative hydrogen breath test for small bowel bacterial overgrowth.CTA updated 08/24/17: mixed plaque at origin of SMA but not felt to be greater than 50%. Celiac and IMA remained patent. Discussed with Dr. Earleen Newport, who recommended IR outpatient clinic evaluation. She was not felt to have chronic mesenteric ischemia.  Physical Exam: BP 115/73    Pulse 94    Temp (!) 96.8 F (36 C) (Temporal)    Ht 5\' 3"  (1.6 m)    Wt 199 lb 9.6 oz (90.5 kg)    BMI 35.36 kg/m  General:   Alert and oriented. No distress noted. Pleasant and cooperative.  Head:  Normocephalic and atraumatic. Eyes:  Conjuctiva clear without scleral icterus. Mouth:  Oral mucosa pink and moist. Good dentition. No lesions. Abdomen:  +BS, soft, mild TTP epigastric  and non-distended. No rebound or guarding. No HSM or masses noted. Msk:  Symmetrical without gross deformities. Normal posture. Extremities:  Without edema. Neurologic:  Alert and  oriented x4 Psych:  Alert and cooperative. Normal mood and  affect.  ASSESSMENT: Michelle Zavala is a 74 y.o. female presenting today with history of chronic abdominal pain, now worsening in setting of grief/stress (lost her significant other October 2nd), but states pain prior to this. Endorses possible component of "nerves". However,  she also notes solid food dysphagia that is recurrent, with noted improvement s/p dilation in the past.   Will pursue EGD/dilation in the near future. Protonix BID is currently controlling reflux symptoms.  Will also add Bentyl due to component of IBS.     PLAN:   Check CBC, CMP, lipase today  Proceed with upper endoscopy/dilation by Dr. Gala Romney in near future with Propofol: the risks, benefits, and alternatives have been discussed with the patient in detail. The patient states understanding and desires to proceed.   Continue Protonix BID   Annitta Needs, PhD, [facility] Gastroenterology

## 2020-07-30 NOTE — Telephone Encounter (Signed)
-----   Message from Verta Ellen., NP sent at 07/28/2020  7:31 PM EST ----- Please let the patient know her stress did not show any evidence of of lack of blood flow in the coronary arteries. It was considered a low risk test.

## 2020-07-31 LAB — COMPLETE METABOLIC PANEL WITH GFR
AG Ratio: 1.7 (calc) (ref 1.0–2.5)
ALT: 10 U/L (ref 6–29)
AST: 16 U/L (ref 10–35)
Albumin: 4.3 g/dL (ref 3.6–5.1)
Alkaline phosphatase (APISO): 55 U/L (ref 37–153)
BUN/Creatinine Ratio: 15 (calc) (ref 6–22)
BUN: 16 mg/dL (ref 7–25)
CO2: 25 mmol/L (ref 20–32)
Calcium: 9 mg/dL (ref 8.6–10.4)
Chloride: 100 mmol/L (ref 98–110)
Creat: 1.1 mg/dL — ABNORMAL HIGH (ref 0.60–0.93)
GFR, Est African American: 57 mL/min/{1.73_m2} — ABNORMAL LOW (ref >=60)
GFR, Est Non African American: 49 mL/min/{1.73_m2} — ABNORMAL LOW (ref >=60)
Globulin: 2.6 g/dL (calc) (ref 1.9–3.7)
Glucose, Bld: 104 mg/dL — ABNORMAL HIGH (ref 65–99)
Potassium: 3.5 mmol/L (ref 3.5–5.3)
Sodium: 139 mmol/L (ref 135–146)
Total Bilirubin: 0.6 mg/dL (ref 0.2–1.2)
Total Protein: 6.9 g/dL (ref 6.1–8.1)

## 2020-07-31 LAB — CBC WITH DIFFERENTIAL/PLATELET
Absolute Monocytes: 967 cells/uL — ABNORMAL HIGH (ref 200–950)
Basophils Absolute: 94 cells/uL (ref 0–200)
Basophils Relative: 0.9 %
Eosinophils Absolute: 198 cells/uL (ref 15–500)
Eosinophils Relative: 1.9 %
HCT: 39.1 % (ref 35.0–45.0)
Hemoglobin: 13.6 g/dL (ref 11.7–15.5)
Lymphs Abs: 3411 cells/uL (ref 850–3900)
MCH: 31.6 pg (ref 27.0–33.0)
MCHC: 34.8 g/dL (ref 32.0–36.0)
MCV: 90.7 fL (ref 80.0–100.0)
MPV: 10.2 fL (ref 7.5–12.5)
Monocytes Relative: 9.3 %
Neutro Abs: 5730 cells/uL (ref 1500–7800)
Neutrophils Relative %: 55.1 %
Platelets: 276 10*3/uL (ref 140–400)
RBC: 4.31 10*6/uL (ref 3.80–5.10)
RDW: 12.4 % (ref 11.0–15.0)
Total Lymphocyte: 32.8 %
WBC: 10.4 10*3/uL (ref 3.8–10.8)

## 2020-07-31 LAB — LIPASE: Lipase: 32 U/L (ref 7–60)

## 2020-07-31 LAB — SPECIMEN COMPROMISED

## 2020-08-01 DIAGNOSIS — N390 Urinary tract infection, site not specified: Secondary | ICD-10-CM | POA: Diagnosis not present

## 2020-08-01 DIAGNOSIS — R110 Nausea: Secondary | ICD-10-CM | POA: Diagnosis not present

## 2020-08-04 DIAGNOSIS — I10 Essential (primary) hypertension: Secondary | ICD-10-CM | POA: Diagnosis not present

## 2020-08-04 DIAGNOSIS — R1084 Generalized abdominal pain: Secondary | ICD-10-CM | POA: Diagnosis not present

## 2020-08-04 DIAGNOSIS — I700 Atherosclerosis of aorta: Secondary | ICD-10-CM | POA: Diagnosis not present

## 2020-08-04 DIAGNOSIS — K5730 Diverticulosis of large intestine without perforation or abscess without bleeding: Secondary | ICD-10-CM | POA: Diagnosis not present

## 2020-08-04 DIAGNOSIS — R197 Diarrhea, unspecified: Secondary | ICD-10-CM | POA: Diagnosis not present

## 2020-08-04 DIAGNOSIS — Z79899 Other long term (current) drug therapy: Secondary | ICD-10-CM | POA: Diagnosis not present

## 2020-08-04 DIAGNOSIS — K429 Umbilical hernia without obstruction or gangrene: Secondary | ICD-10-CM | POA: Diagnosis not present

## 2020-08-04 DIAGNOSIS — R109 Unspecified abdominal pain: Secondary | ICD-10-CM | POA: Diagnosis not present

## 2020-08-04 DIAGNOSIS — E876 Hypokalemia: Secondary | ICD-10-CM | POA: Diagnosis not present

## 2020-08-04 DIAGNOSIS — K219 Gastro-esophageal reflux disease without esophagitis: Secondary | ICD-10-CM | POA: Diagnosis not present

## 2020-08-04 DIAGNOSIS — Z882 Allergy status to sulfonamides status: Secondary | ICD-10-CM | POA: Diagnosis not present

## 2020-08-06 ENCOUNTER — Telehealth: Payer: Self-pay | Admitting: *Deleted

## 2020-08-06 ENCOUNTER — Encounter: Payer: Self-pay | Admitting: *Deleted

## 2020-08-06 NOTE — Telephone Encounter (Signed)
Called pt. Procedure date/time has been moved to 12/9 at 12:00pm. Patient aware will mail new pre-op/covid test with instructions to her.

## 2020-08-06 NOTE — Telephone Encounter (Signed)
PA approved via [facility] website. Auth# [id number] DOS: Aug 21, 2020 - Sep 12, 2020

## 2020-08-15 NOTE — Patient Instructions (Signed)
Michelle Zavala  08/15/2020     @PREFPERIOPPHARMACY @   Your procedure is scheduled on  08/21/2020.  Report to [facility] at  1030  A.M.  Call this number if you have problems the morning of surgery:  [phone]   Remember:  Follow the diet instructions given to you by the office.                      Take these medicines the morning of surgery with A SIP OF WATER  Ativan(if needed), protonix, zoloft.    Do not wear jewelry, make-up or nail polish.  Do not wear lotions, powders, or perfumes. Please wear deodorant and brush your teeth.  Do not shave 48 hours prior to surgery.  Men may shave face and neck.  Do not bring valuables to the hospital.  [facility] is not responsible for any belongings or valuables.  Contacts, dentures or bridgework may not be worn into surgery.  Leave your suitcase in the car.  After surgery it may be brought to your room.  For patients admitted to the hospital, discharge time will be determined by your treatment team.  Patients discharged the day of surgery will not be allowed to drive home.   Name and phone number of your driver:   family Special instructions:   DO NOT smoke the morning of your procedure.  Please read over the following fact sheets that you were given. Anesthesia Post-op Instructions and Care and Recovery After Surgery       Upper Endoscopy, Adult, Care After This sheet gives you information about how to care for yourself after your procedure. Your health care provider may also give you more specific instructions. If you have problems or questions, contact your health care provider. What can I expect after the procedure? After the procedure, it is common to have:  A sore throat.  Mild stomach pain or discomfort.  Bloating.  Nausea. Follow these instructions at home:   Follow instructions from your health care provider about what to eat or drink after your procedure.  Return to your normal activities as told  by your health care provider. Ask your health care provider what activities are safe for you.  Take over-the-counter and prescription medicines only as told by your health care provider.  Do not drive for 24 hours if you were given a sedative during your procedure.  Keep all follow-up visits as told by your health care provider. This is important. Contact a health care provider if you have:  A sore throat that lasts longer than one day.  Trouble swallowing. Get help right away if:  You vomit blood or your vomit looks like coffee grounds.  You have: ? A fever. ? Bloody, black, or tarry stools. ? A severe sore throat or you cannot swallow. ? Difficulty breathing. ? Severe pain in your chest or abdomen. Summary  After the procedure, it is common to have a sore throat, mild stomach discomfort, bloating, and nausea.  Do not drive for 24 hours if you were given a sedative during the procedure.  Follow instructions from your health care provider about what to eat or drink after your procedure.  Return to your normal activities as told by your health care provider. This information is not intended to replace advice given to you by your health care provider. Make sure you discuss any questions you have with your health care provider. Document Revised:  02/21/2018 Document Reviewed: 01/30/2018 Elsevier Patient Education  [place].  Esophageal Dilatation Esophageal dilatation, also called esophageal dilation, is a procedure to widen or open (dilate) a blocked or narrowed part of the esophagus. The esophagus is the part of the body that moves food and liquid from the mouth to the stomach. You may need this procedure if:  You have a buildup of scar tissue in your esophagus that makes it difficult, painful, or impossible to swallow. This can be caused by gastroesophageal reflux disease (GERD).  You have cancer of the esophagus.  There is a problem with how food moves through your  esophagus. In some cases, you may need this procedure repeated at a later time to dilate the esophagus gradually. Tell a health care provider about:  Any allergies you have.  All medicines you are taking, including vitamins, herbs, eye drops, creams, and over-the-counter medicines.  Any problems you or family members have had with anesthetic medicines.  Any blood disorders you have.  Any surgeries you have had.  Any medical conditions you have.  Any antibiotic medicines you are required to take before dental procedures.  Whether you are pregnant or may be pregnant. What are the risks? Generally, this is a safe procedure. However, problems may occur, including:  Bleeding due to a tear in the lining of the esophagus.  A hole (perforation) in the esophagus. What happens before the procedure?  Follow instructions from your health care provider about eating or drinking restrictions.  Ask your health care provider about changing or stopping your regular medicines. This is especially important if you are taking diabetes medicines or blood thinners.  Plan to have someone take you home from the hospital or clinic.  Plan to have a responsible adult care for you for at least 24 hours after you leave the hospital or clinic. This is important. What happens during the procedure?  You may be given a medicine to help you relax (sedative).  A numbing medicine may be sprayed into the back of your throat, or you may gargle the medicine.  Your health care provider may perform the dilatation using various surgical instruments, such as: ? Simple dilators. This instrument is carefully placed in the esophagus to stretch it. ? Guided wire bougies. This involves using an endoscope to insert a wire into the esophagus. A dilator is passed over this wire to enlarge the esophagus. Then the wire is removed. ? Balloon dilators. An endoscope with a small balloon at the end is inserted into the esophagus.  The balloon is inflated to stretch the esophagus and open it up. The procedure may vary among health care providers and hospitals. What happens after the procedure?  Your blood pressure, heart rate, breathing rate, and blood oxygen level will be monitored until the medicines you were given have worn off.  Your throat may feel slightly sore and numb. This will improve slowly over time.  You will not be allowed to eat or drink until your throat is no longer numb.  When you are able to drink, urinate, and sit on the edge of the bed without nausea or dizziness, you may be able to return home. Follow these instructions at home:  Take over-the-counter and prescription medicines only as told by your health care provider.  Do not drive for 24 hours if you were given a sedative during your procedure.  You should have a responsible adult with you for 24 hours after the procedure.  Follow instructions from  your health care provider about any eating or drinking restrictions.  Do not use any products that contain nicotine or tobacco, such as cigarettes and e-cigarettes. If you need help quitting, ask your health care provider.  Keep all follow-up visits as told by your health care provider. This is important. Get help right away if you:  Have a fever.  Have chest pain.  Have pain that is not relieved by medication.  Have trouble breathing.  Have trouble swallowing.  Vomit blood. Summary  Esophageal dilatation, also called esophageal dilation, is a procedure to widen or open (dilate) a blocked or narrowed part of the esophagus.  Plan to have someone take you home from the hospital or clinic.  For this procedure, a numbing medicine may be sprayed into the back of your throat, or you may gargle the medicine.  Do not drive for 24 hours if you were given a sedative during your procedure. This information is not intended to replace advice given to you by your health care provider. Make  sure you discuss any questions you have with your health care provider. Document Revised: 06/27/2019 Document Reviewed: 07/05/2017 Elsevier Patient Education  2020 [place] After These instructions provide you with information about caring for yourself after your procedure. Your health care provider may also give you more specific instructions. Your treatment has been planned according to current medical practices, but problems sometimes occur. Call your health care provider if you have any problems or questions after your procedure. What can I expect after the procedure? After your procedure, you may:  Feel sleepy for several hours.  Feel clumsy and have poor balance for several hours.  Feel forgetful about what happened after the procedure.  Have poor judgment for several hours.  Feel nauseous or vomit.  Have a sore throat if you had a breathing tube during the procedure. Follow these instructions at home: For at least 24 hours after the procedure:      Have a responsible adult stay with you. It is important to have someone help care for you until you are awake and alert.  Rest as needed.  Do not: ? Participate in activities in which you could fall or become injured. ? Drive. ? Use heavy machinery. ? Drink alcohol. ? Take sleeping pills or medicines that cause drowsiness. ? Make important decisions or sign legal documents. ? Take care of children on your own. Eating and drinking  Follow the diet that is recommended by your health care provider.  If you vomit, drink water, juice, or soup when you can drink without vomiting.  Make sure you have little or no nausea before eating solid foods. General instructions  Take over-the-counter and prescription medicines only as told by your health care provider.  If you have sleep apnea, surgery and certain medicines can increase your risk for breathing problems. Follow instructions from  your health care provider about wearing your sleep device: ? Anytime you are sleeping, including during daytime naps. ? While taking prescription pain medicines, sleeping medicines, or medicines that make you drowsy.  If you smoke, do not smoke without supervision.  Keep all follow-up visits as told by your health care provider. This is important. Contact a health care provider if:  You keep feeling nauseous or you keep vomiting.  You feel light-headed.  You develop a rash.  You have a fever. Get help right away if:  You have trouble breathing. Summary  For several hours after your  procedure, you may feel sleepy and have poor judgment.  Have a responsible adult stay with you for at least 24 hours or until you are awake and alert. This information is not intended to replace advice given to you by your health care provider. Make sure you discuss any questions you have with your health care provider. Document Revised: 11/28/2017 Document Reviewed: 12/21/2015 Elsevier Patient Education  [place].

## 2020-08-19 ENCOUNTER — Ambulatory Visit: Payer: Medicare Other | Admitting: Internal Medicine

## 2020-08-20 ENCOUNTER — Other Ambulatory Visit (HOSPITAL_COMMUNITY)
Admission: RE | Admit: 2020-08-20 | Discharge: 2020-08-20 | Disposition: A | Discharge status: Home / Self Care | Payer: Medicare Other | Source: Ambulatory Visit | Attending: Internal Medicine | Admitting: Internal Medicine

## 2020-08-20 ENCOUNTER — Other Ambulatory Visit: Payer: Self-pay

## 2020-08-20 ENCOUNTER — Encounter (HOSPITAL_COMMUNITY): Payer: Self-pay

## 2020-08-20 ENCOUNTER — Encounter (HOSPITAL_COMMUNITY)
Admission: RE | Admit: 2020-08-20 | Discharge: 2020-08-20 | Disposition: A | Discharge status: Home / Self Care | Payer: Medicare Other | Source: Ambulatory Visit | Attending: Internal Medicine | Admitting: Internal Medicine

## 2020-08-20 DIAGNOSIS — Z01812 Encounter for preprocedural laboratory examination: Secondary | ICD-10-CM | POA: Diagnosis not present

## 2020-08-20 DIAGNOSIS — Z20822 Contact with and (suspected) exposure to covid-19: Secondary | ICD-10-CM | POA: Insufficient documentation

## 2020-08-20 LAB — SARS CORONAVIRUS 2 (TAT 6-24 HRS): SARS Coronavirus 2: NEGATIVE

## 2020-08-21 ENCOUNTER — Ambulatory Visit (HOSPITAL_COMMUNITY)
Admission: RE | Admit: 2020-08-21 | Discharge: 2020-08-21 | Disposition: A | Discharge status: Home / Self Care | Payer: Medicare Other | Attending: Internal Medicine | Admitting: Internal Medicine

## 2020-08-21 ENCOUNTER — Ambulatory Visit (HOSPITAL_COMMUNITY): Payer: Medicare Other | Admitting: Anesthesiology

## 2020-08-21 ENCOUNTER — Encounter (HOSPITAL_COMMUNITY)
Admission: RE | Disposition: A | Discharge status: Home / Self Care | Payer: Self-pay | Source: Home / Self Care | Attending: Internal Medicine

## 2020-08-21 DIAGNOSIS — K319 Disease of stomach and duodenum, unspecified: Secondary | ICD-10-CM | POA: Diagnosis not present

## 2020-08-21 DIAGNOSIS — K589 Irritable bowel syndrome without diarrhea: Secondary | ICD-10-CM | POA: Diagnosis not present

## 2020-08-21 DIAGNOSIS — Z79899 Other long term (current) drug therapy: Secondary | ICD-10-CM | POA: Diagnosis not present

## 2020-08-21 DIAGNOSIS — R1013 Epigastric pain: Secondary | ICD-10-CM | POA: Insufficient documentation

## 2020-08-21 DIAGNOSIS — G8929 Other chronic pain: Secondary | ICD-10-CM | POA: Diagnosis not present

## 2020-08-21 DIAGNOSIS — K3189 Other diseases of stomach and duodenum: Secondary | ICD-10-CM | POA: Diagnosis not present

## 2020-08-21 DIAGNOSIS — Z96653 Presence of artificial knee joint, bilateral: Secondary | ICD-10-CM | POA: Insufficient documentation

## 2020-08-21 DIAGNOSIS — R1310 Dysphagia, unspecified: Secondary | ICD-10-CM | POA: Diagnosis not present

## 2020-08-21 DIAGNOSIS — J449 Chronic obstructive pulmonary disease, unspecified: Secondary | ICD-10-CM | POA: Diagnosis not present

## 2020-08-21 DIAGNOSIS — I10 Essential (primary) hypertension: Secondary | ICD-10-CM | POA: Diagnosis not present

## 2020-08-21 HISTORY — PX: MALONEY DILATION: SHX5535

## 2020-08-21 HISTORY — PX: ESOPHAGOGASTRODUODENOSCOPY (EGD) WITH PROPOFOL: SHX5813

## 2020-08-21 HISTORY — PX: BIOPSY: SHX5522

## 2020-08-21 SURGERY — ESOPHAGOGASTRODUODENOSCOPY (EGD) WITH PROPOFOL
Anesthesia: General

## 2020-08-21 MED ORDER — LIDOCAINE HCL (CARDIAC) PF 100 MG/5ML IV SOSY
PREFILLED_SYRINGE | INTRAVENOUS | Status: DC | PRN
  Administered 2020-08-21: 50 mg via INTRAVENOUS

## 2020-08-21 MED ORDER — PROPOFOL 10 MG/ML IV BOLUS
INTRAVENOUS | Status: DC | PRN
  Administered 2020-08-21: 100 mg via INTRAVENOUS
  Administered 2020-08-21: 40 mg via INTRAVENOUS
  Administered 2020-08-21: 30 mg via INTRAVENOUS

## 2020-08-21 MED ORDER — LIDOCAINE VISCOUS HCL 2 % MT SOLN
OROMUCOSAL | Status: AC
  Filled 2020-08-21: qty 15

## 2020-08-21 MED ORDER — LACTATED RINGERS IV SOLN: INTRAVENOUS | Status: DC | PRN

## 2020-08-21 MED ORDER — STERILE WATER FOR IRRIGATION IR SOLN
Status: DC | PRN
  Administered 2020-08-21 (×2): 100 mL

## 2020-08-21 MED ORDER — LIDOCAINE VISCOUS HCL 2 % MT SOLN
15.0000 mL | Freq: Once | OROMUCOSAL | Status: AC
  Administered 2020-08-21: 15 mL via OROMUCOSAL

## 2020-08-21 MED ORDER — LACTATED RINGERS IV SOLN: Freq: Once | INTRAVENOUS | Status: AC

## 2020-08-21 NOTE — Discharge Instructions (Signed)
EGD Discharge instructions Please read the instructions outlined below and refer to this sheet in the next few weeks. These discharge instructions provide you with general information on caring for yourself after you leave the hospital. Your doctor may also give you specific instructions. While your treatment has been planned according to the most current medical practices available, unavoidable complications occasionally occur. If you have any problems or questions after discharge, please call your doctor. ACTIVITY  You may resume your regular activity but move at a slower pace for the next 24 hours.   Take frequent rest periods for the next 24 hours.   Walking will help expel (get rid of) the air and reduce the bloated feeling in your abdomen.   No driving for 24 hours (because of the anesthesia (medicine) used during the test).   You may shower.   Do not sign any important legal documents or operate any machinery for 24 hours (because of the anesthesia used during the test).  NUTRITION  Drink plenty of fluids.   You may resume your normal diet.   Begin with a light meal and progress to your normal diet.   Avoid alcoholic beverages for 24 hours or as instructed by your caregiver.  MEDICATIONS  You may resume your normal medications unless your caregiver tells you otherwise.  WHAT YOU CAN EXPECT TODAY  You may experience abdominal discomfort such as a feeling of fullness or gas pains.  FOLLOW-UP  Your doctor will discuss the results of your test with you.  SEEK IMMEDIATE MEDICAL ATTENTION IF ANY OF THE FOLLOWING OCCUR:  Excessive nausea (feeling sick to your stomach) and/or vomiting.   Severe abdominal pain and distention (swelling).   Trouble swallowing.   Temperature over 101 F (37.8 C).   Rectal bleeding or vomiting of blood.   Your esophagus was dilated today.  Your stomach looked a little inflamed.  It was biopsied.  Further recommendations to follow pending  review of pathology report  Office visit with Roseanne Kaufman in 2 months  At patient request, I called Carlton Adam at [phone] -reviewed results  Monitored Anesthesia Care, Care After These instructions provide you with information about caring for yourself after your procedure. Your health care provider may also give you more specific instructions. Your treatment has been planned according to current medical practices, but problems sometimes occur. Call your health care provider if you have any problems or questions after your procedure. What can I expect after the procedure? After your procedure, you may:  Feel sleepy for several hours.  Feel clumsy and have poor balance for several hours.  Feel forgetful about what happened after the procedure.  Have poor judgment for several hours.  Feel nauseous or vomit.  Have a sore throat if you had a breathing tube during the procedure. Follow these instructions at home: For at least 24 hours after the procedure:      Have a responsible adult stay with you. It is important to have someone help care for you until you are awake and alert.  Rest as needed.  Do not: ? Participate in activities in which you could fall or become injured. ? Drive. ? Use heavy machinery. ? Drink alcohol. ? Take sleeping pills or medicines that cause drowsiness. ? Make important decisions or sign legal documents. ? Take care of children on your own. Eating and drinking  Follow the diet that is recommended by your health care provider.  If you vomit, drink water, juice, or soup  when you can drink without vomiting.  Make sure you have little or no nausea before eating solid foods. General instructions  Take over-the-counter and prescription medicines only as told by your health care provider.  If you have sleep apnea, surgery and certain medicines can increase your risk for breathing problems. Follow instructions from your health care provider about  wearing your sleep device: ? Anytime you are sleeping, including during daytime naps. ? While taking prescription pain medicines, sleeping medicines, or medicines that make you drowsy.  If you smoke, do not smoke without supervision.  Keep all follow-up visits as told by your health care provider. This is important. Contact a health care provider if:  You keep feeling nauseous or you keep vomiting.  You feel light-headed.  You develop a rash.  You have a fever. Get help right away if:  You have trouble breathing. Summary  For several hours after your procedure, you may feel sleepy and have poor judgment.  Have a responsible adult stay with you for at least 24 hours or until you are awake and alert. This information is not intended to replace advice given to you by your health care provider. Make sure you discuss any questions you have with your health care provider. Document Revised: 11/28/2017 Document Reviewed: 12/21/2015 Elsevier Patient Education  [place].

## 2020-08-21 NOTE — Anesthesia Postprocedure Evaluation (Signed)
Anesthesia Post Note  Patient: Michelle Zavala  Procedure(s) Performed: ESOPHAGOGASTRODUODENOSCOPY (EGD) WITH PROPOFOL (N/A ) MALONEY DILATION (N/A ) BIOPSY  Patient location during evaluation: PACU Anesthesia Type: General Level of consciousness: awake and alert and oriented Pain management: pain level controlled Vital Signs Assessment: post-procedure vital signs reviewed and stable Respiratory status: spontaneous breathing, nonlabored ventilation and respiratory function stable Cardiovascular status: blood pressure returned to baseline and stable Postop Assessment: no apparent nausea or vomiting Anesthetic complications: no   No complications documented.   Last Vitals:  Vitals:   08/21/20 1124 08/21/20 1130  BP: 122/63   Pulse: 86 74  Resp:  19  Temp: 36.6 C   SpO2: 97% 98%    Last Pain:  Vitals:   08/21/20 1130  TempSrc:   PainSc: [place]

## 2020-08-21 NOTE — Transfer of Care (Signed)
Immediate Anesthesia Transfer of Care Note  Patient: Michelle Zavala  Procedure(s) Performed: ESOPHAGOGASTRODUODENOSCOPY (EGD) WITH PROPOFOL (N/A ) MALONEY DILATION (N/A ) BIOPSY  Patient Location: PACU  Anesthesia Type:General  Level of Consciousness: drowsy  Airway & Oxygen Therapy: Patient Spontanous Breathing and Patient connected to nasal cannula oxygen  Post-op Assessment: Report given to RN and Post -op Vital signs reviewed and stable  Post vital signs: Reviewed and stable  Last Vitals:  Vitals Value Taken Time  BP    Temp    Pulse    Resp    SpO2      Last Pain:  Vitals:   08/21/20 1106  TempSrc:   PainSc: 0-No pain      Patients Stated Pain Goal: 9 (45/14/60 4799)  Complications: No complications documented.

## 2020-08-21 NOTE — Op Note (Signed)
[facility] Patient Name: Michelle Zavala Procedure Date: 08/21/2020 7:22 AM MRN: [id number] Date of Birth: [birth year] Attending MD: Norvel Richards , MD CSN: [id number] Age: 74 Admit Type: Outpatient Procedure:                Upper GI endoscopy Indications:              Epigastric abdominal pain, Dysphagia Providers:                Norvel Richards, MD, Rosina Lowenstein, RN, Caprice Kluver, Aram Candela Referring MD:              Medicines:                Propofol per Anesthesia Complications:            No immediate complications. Estimated Blood Loss:     Estimated blood loss was minimal. Procedure:                Pre-Anesthesia Assessment:                           - Prior to the procedure, a History and Physical                            was performed, and patient medications and                            allergies were reviewed. The patient's tolerance of                            previous anesthesia was also reviewed. The risks                            and benefits of the procedure and the sedation                            options and risks were discussed with the patient.                            All questions were answered, and informed consent                            was obtained. Prior Anticoagulants: The patient has                            taken no previous anticoagulant or antiplatelet                            agents. ASA Grade Assessment: II - A patient with                            mild systemic disease. After reviewing the risks  and benefits, the patient was deemed in                            satisfactory condition to undergo the procedure.                           After obtaining informed consent, the endoscope was                            passed under direct vision. Throughout the                            procedure, the patient's blood pressure, pulse, and                             oxygen saturations were monitored continuously. The                            [id number]-H190 ([id number]) scope was introduced through the                            mouth, and advanced to the second part of duodenum.                            The upper GI endoscopy was accomplished without                            difficulty. The patient tolerated the procedure                            well. Scope In: 11:10:58 AM Scope Out: 11:20:21 AM Total Procedure Duration: 0 hours 9 minutes 23 seconds  Findings:      The examined esophagus was normal.      Diffuse moderately erythematous mucosa was found in the entire examined       stomach.      The duodenal bulb and second portion of the duodenum were normal. The       scope was withdrawn. Dilation was performed with a Maloney dilator with       mild resistance at 56 Fr. The dilation site was examined following       endoscope reinsertion and showed no change. Estimated blood loss: none.       This was biopsied with a cold forceps for histology. Estimated blood       loss was minimal. Impression:               - Normal esophagus. Dilated.                           - Erythematous mucosa in the stomach of doubtful                            clinical significance. Biopsied.                           - Normal duodenal bulb and second portion of  the                            duodenum. Moderate Sedation:      Moderate (conscious) sedation was personally administered by an       anesthesia professional. The following parameters were monitored: oxygen       saturation, heart rate, blood pressure, respiratory rate, EKG, adequacy       of pulmonary ventilation, and response to care. Recommendation:           - Patient has a contact number available for                            emergencies. The signs and symptoms of potential                            delayed complications were discussed with the                            patient. Return to normal  activities tomorrow.                            Written discharge instructions were provided to the                            patient.                           - Advance diet as tolerated.                           - Continue present medications.                           - Return to my office in 2 months. Procedure Code(s):        --- Professional ---                           [phone], Esophagogastroduodenoscopy, flexible,                            transoral; with biopsy, single or multiple                           43450, Dilation of esophagus, by unguided sound or                            bougie, single or multiple passes Diagnosis Code(s):        --- Professional ---                           K31.89, Other diseases of stomach and duodenum                           R10.13, Epigastric pain  R13.10, Dysphagia, unspecified CPT copyright 2019 American Medical Association. All rights reserved. The codes documented in this report are preliminary and upon coder review may  be revised to meet current compliance requirements. [name]. [name], MD Norvel Richards, MD 08/21/2020 11:54:46 AM This report has been signed electronically. Number of Addenda: 0

## 2020-08-21 NOTE — Anesthesia Preprocedure Evaluation (Signed)
Anesthesia Evaluation  Patient identified by MRN, date of birth, ID band Patient awake    Reviewed: Allergy & Precautions, NPO status , Patient's Chart, lab work & pertinent test results  Airway Mallampati: III  TM Distance: >3 FB Neck ROM: Full   Comment: C 2 cervical fx Dental  (+) Dental Advisory Given, Partial Upper, Missing, Caps,    Pulmonary COPD,  COPD inhaler,    Pulmonary exam normal breath sounds clear to auscultation       Cardiovascular hypertension, Pt. on medications + CAD  Normal cardiovascular exam+ dysrhythmias Supra Ventricular Tachycardia  Rhythm:Regular Rate:Normal  1. Left ventricular ejection fraction, by estimation, is 50 to 55%. The  left ventricle has low normal function. The left ventricle has no regional  wall motion abnormalities. There is mild left ventricular hypertrophy.  Left ventricular diastolic  parameters are indeterminate.  2. Right ventricular systolic function is normal. The right ventricular  size is normal. Tricuspid regurgitation signal is inadequate for assessing  PA pressure.  3. The mitral valve is grossly normal. Trivial mitral valve  regurgitation.  4. The aortic valve is tricuspid. Aortic valve regurgitation is mild.  Aortic regurgitation PHT measures 670 msec.  5. Aortic dilatation noted. There is moderate dilatation of the ascending  aorta, measuring 44 mm.  6. The inferior vena cava is normal in size with greater than 50%  respiratory variability, suggesting right atrial pressure of 3 mmHg.    Neuro/Psych PSYCHIATRIC DISORDERS Anxiety Depression    GI/Hepatic Neg liver ROS, GERD  ,  Endo/Other  negative endocrine ROS  Renal/GU Renal disease     Musculoskeletal  (+) Arthritis , C 2 cervical fx   Abdominal   Peds  Hematology negative hematology ROS (+)   Anesthesia Other Findings   Reproductive/Obstetrics                              Anesthesia Physical Anesthesia Plan  ASA: III  Anesthesia Plan: General   Post-op Pain Management:    Induction: Intravenous  PONV Risk Score and Plan: TIVA  Airway Management Planned: Nasal Cannula, Natural Airway and Simple Face Mask  Additional Equipment:   Intra-op Plan:   Post-operative Plan:   Informed Consent: I have reviewed the patients History and Physical, chart, labs and discussed the procedure including the risks, benefits and alternatives for the proposed anesthesia with the patient or authorized representative who has indicated his/her understanding and acceptance.     Dental advisory given  Plan Discussed with: CRNA and Surgeon  Anesthesia Plan Comments:         Anesthesia Quick Evaluation

## 2020-08-21 NOTE — Interval H&P Note (Signed)
History and Physical Interval Note:  08/21/2020 11:03 AM  Michelle Zavala  has presented today for surgery, with the diagnosis of dyspepsia, dysphagia.  The various methods of treatment have been discussed with the patient and family. After consideration of risks, benefits and other options for treatment, the patient has consented to  Procedure(s) with comments: ESOPHAGOGASTRODUODENOSCOPY (EGD) WITH PROPOFOL (N/A) - 1:15pm MALONEY DILATION (N/A) as a surgical intervention.  The patient's history has been reviewed, patient examined, no change in status, stable for surgery.  I have reviewed the patient's chart and labs.  Questions were answered to the patient's satisfaction.     Michelle Zavala  No change.  EGD with possible esophageal dilation as feasible/appropriate today per plan. The risks, benefits, limitations, alternatives and imponderables have been reviewed with the patient. Potential for esophageal dilation, biopsy, etc. have also been reviewed.  Questions have been answered. All parties agreeable.

## 2020-08-22 ENCOUNTER — Other Ambulatory Visit: Payer: Self-pay

## 2020-08-22 LAB — SURGICAL PATHOLOGY

## 2020-08-23 ENCOUNTER — Encounter: Payer: Self-pay | Admitting: Internal Medicine

## 2020-08-27 ENCOUNTER — Encounter (HOSPITAL_COMMUNITY): Payer: Self-pay | Admitting: Internal Medicine

## 2020-08-28 NOTE — Progress Notes (Signed)
Cardiology Office Note  Date: 08/29/2020   ID: Michelle Zavala, DOB [birth year], MRN [id number]  PCP:  Neale Burly, MD  Cardiologist:  Rozann Lesches, MD Electrophysiologist:  None   Chief Complaint: Chest pain on and off since October.  Also taking atorvastatin prescribed by PCP.  Asking if she needs to be taking it.    History of Present Illness: Michelle Zavala is a 74 y.o. female with a history of PSVT, CAD, HTN, COPD.[name]  Last encounter with Dr. Domenic Polite 10/05/2019.  She did not report any anginal symptoms or palpitations.  Reported lack of energy and initiated.  Taking Zoloft for depression.  She had lost 2 of her children and prior year.  She was continuing Toprol-XL and omega-3 supplements.  EKG was reviewed showing sinus rhythm with borderline prolonged PR intervals and sinus arrhythmia.  She was continuing HCTZ with potassium supplements.  Mild CAD per previous work-up.  Myoview study May 2019 low risk.  Last visit she was here with complaints of some chest pain on and off since October.  Stated she had been under a lot of stress lately due to the death of her friend.  She had 2 children pass away in the last year or so.  She stated the chest pain originated below her left breast and radiates to mid chest.  She denied any radiation to the neck, arm, back, or jaw.  Stated she had almost daily nausea but it is not associated with the chest pain.  She stated the chest pain could occur with or without activity.  Also complained of increased dyspnea on exertion over the last 2 months associated with exertion.  Stated the dyspnea is not associated with the chest pain.  Also plan of dizziness off and on but no syncope or near syncopal episodes.  Denied any CVA or TIA-like symptoms.  No PND or orthopnea.  No bleeding.  No claudication-like symptoms, DVT or PE-like symptoms, or lower extremity edema.  She was recently placed on atorvastatin.  She is here for follow-up today status post recent stress  test for complaints of chest pain on and off since October.  Stress test was considered to be low risk.  States she is continuing to have some dyspnea on exertion.  She is under significant amount of stress due to loss of 2 children and her significant other.  Denies any chest pain.  No orthostatic symptoms, CVA or TIA-like symptoms, PND, orthopnea, bleeding issues, DVT or PE-like symptoms, lower extremity edema.  Denies any symptoms of sleep apnea i.e. snoring, apneic episodes or excessive daytime sleepiness.   Past Medical History:  Diagnosis Date  . Anxiety   . Arthritis   . C2 cervical fracture ([facility]) 09/17/13   Traumatic fracture witth minimal displacement  . Chronic lung disease    Fibrosis - Dr. Koleen Nimrod  . COPD (chronic obstructive pulmonary disease) ([facility])   . Coronary atherosclerosis of native coronary artery    Mild atherosclerosis 3/10, LVEF 60-65%  . Depression   . Diverticulosis   . Essential hypertension   . GERD (gastroesophageal reflux disease)   . PSVT (paroxysmal supraventricular tachycardia) ([facility])     Past Surgical History:  Procedure Laterality Date  . ABDOMINAL HYSTERECTOMY    . ANTERIOR RELEASE VERTEBRAL BODY W/ POSTERIOR FUSION    . BACTERIAL OVERGROWTH TEST N/A 02/19/2016   Procedure: BACTERIAL OVERGROWTH TEST;  Surgeon: Daneil Dolin, MD;  [facility];  Service: Endoscopy;  Laterality: N/A;  0700  .  BIOPSY N/A 08/04/2015   Procedure: BIOPSY;  Surgeon: Daneil Dolin, MD;  Location: [facility];  Service: Endoscopy;  Laterality: N/A;  Gastric  . BIOPSY  02/21/2017   Procedure: BIOPSY;  Surgeon: Daneil Dolin, MD;  [facility];  Service: Endoscopy;;  ascending colon biopsy   . BIOPSY  08/21/2020   Procedure: BIOPSY;  Surgeon: Daneil Dolin, MD;  [facility];  Service: Endoscopy;;  . cataract surgery    . CESAREAN SECTION    . CHOLECYSTECTOMY  1973  . COLONOSCOPY  June 2016   Dr. Britta Mccreedy: moderate diverticulosis in sigmoid colon,  surveillance in 5 years   . COLONOSCOPY WITH PROPOFOL N/A 02/21/2017   Dr. Gala Romney: diverticulosis in sigmoid colon, tubular adenomas, segmental biopsies benign. Surveillance in 5 years   . ESOPHAGOGASTRODUODENOSCOPY (EGD) WITH PROPOFOL N/A 08/04/2015   Dr. Rourk:abnormal gastric mucosa s/p biopsy. Reactive gastropathy. Negative H.pylori  . ESOPHAGOGASTRODUODENOSCOPY (EGD) WITH PROPOFOL N/A 02/21/2017   Dr. Gala Romney: empiric esophageal dilatation, small hiatal hernia, otherwise normal  . ESOPHAGOGASTRODUODENOSCOPY (EGD) WITH PROPOFOL N/A 08/21/2020   Procedure: ESOPHAGOGASTRODUODENOSCOPY (EGD) WITH PROPOFOL;  Surgeon: Daneil Dolin, MD;  [facility];  Service: Endoscopy;  Laterality: N/A;  1:15pm  . INCISIONAL HERNIA REPAIR N/A 12/03/2015   Procedure: [name] HERNIORRHAPHY WITH MESH;  Surgeon: Aviva Signs, MD;  Location: [facility];  Service: General;  Laterality: N/A;  . INSERTION OF MESH  12/03/2015   Procedure: INSERTION OF MESH;  Surgeon: Aviva Signs, MD;  Location: [facility];  Service: General;;  . IR RADIOLOGIST EVAL & MGMT  09/22/2017  . MALONEY DILATION N/A 02/21/2017   Procedure: [name] DILATION;  Surgeon: Daneil Dolin, MD;  [facility];  Service: Endoscopy;  Laterality: N/A;  . [name] DILATION N/A 08/21/2020   Procedure: [name] DILATION;  Surgeon: Daneil Dolin, MD;  [facility];  Service: Endoscopy;  Laterality: N/A;  . POLYPECTOMY  02/21/2017   Procedure: POLYPECTOMY;  Surgeon: Daneil Dolin, MD;  [facility];  Service: Endoscopy;;  hepatic flexure polyp cs  . TMJ ARTHROSCOPY Bilateral 02/04/2015   Procedure: BILATERAL TEMPOROMANDIBULAR JOINT (TMJ) ARTHROSCOPY MENISECTOMY WITH FAT GRAFT FROM ABDOMEN ;  Surgeon: Diona Browner, DDS;  Location: [facility];  Service: Oral Surgery;  Laterality: Bilateral;  . TOTAL KNEE ARTHROPLASTY Bilateral   . TUBAL LIGATION      Current Outpatient Medications  Medication Sig Dispense Refill  . acetaminophen  (TYLENOL) 650 MG CR tablet Take 1,300 mg by mouth every 8 (eight) hours as needed for pain.     [name] albuterol (VENTOLIN HFA) 108 (90 Base) MCG/ACT inhaler Inhale 2 puffs into the lungs every 4 (four) hours as needed for wheezing or shortness of breath.    [name] amitriptyline (ELAVIL) 75 MG tablet Take 75 mg by mouth at bedtime.  6  . Ascorbic Acid (VITAMIN C) 1000 MG tablet Take 1,000 mg by mouth daily.    . cholecalciferol (VITAMIN D3) 25 MCG (1000 UNIT) tablet Take 1,000 Units by mouth daily.    [name] dicyclomine (BENTYL) 10 MG capsule Take 1 capsule (10 mg total) by mouth 4 (four) times daily -  before meals and at bedtime. For abdominal cramping and loose stool (Patient taking differently: Take 10 mg by mouth 3 (three) times daily as needed for spasms. For abdominal cramping and loose stool) 120 capsule 3  . fluticasone (FLONASE) 50 MCG/ACT nasal spray Place 2 sprays into both nostrils daily as  needed for allergies or rhinitis.    . hydrochlorothiazide (HYDRODIURIL) 25 MG tablet Take 25 mg by mouth every morning.    [name] ipratropium-albuterol (DUONEB) 0.5-2.5 (3) MG/3ML SOLN Take 3 mLs by nebulization every 4 (four) hours as needed (asthma).     . LORazepam (ATIVAN) 1 MG tablet Take 1 mg by mouth 2 (two) times daily.    . Multiple Vitamins-Minerals (PRESERVISION AREDS PO) Take 1 tablet by mouth in the morning and at bedtime.     . Omega-3 Fatty Acids (FISH OIL) 1000 MG CAPS Take 3,000 mg by mouth daily.     . pantoprazole (PROTONIX) 40 MG tablet Take 1 tablet (40 mg total) by mouth 2 (two) times daily. 60 tablet 1  . potassium chloride SA (KLOR-CON) 20 MEQ tablet Take 20 mEq by mouth daily.    . sertraline (ZOLOFT) 100 MG tablet Take 100 mg by mouth daily.    [name] umeclidinium-vilanterol (ANORO ELLIPTA) 62.5-25 MCG/INH AEPB Inhale 1 puff into the lungs daily as needed (asthma).     . vitamin B-12 (CYANOCOBALAMIN) 1000 MCG tablet Take 1,000 mcg by mouth daily.    . Vitamin D, Ergocalciferol, (DRISDOL) 50000  units CAPS capsule Take 50,000 Units by mouth every 7 (seven) days. Saturday     No current facility-administered medications for this visit.   Allergies:  Sulfonamide derivatives   Social History: The patient  reports that she has never smoked. She has never used smokeless tobacco. She reports that she does not drink alcohol and does not use drugs.   Family History: The patient's family history includes Colon cancer in her son; Coronary artery disease in her brother, father, and sister; Heart disease in her father.   ROS:  Please see the history of present illness. Otherwise, complete review of systems is positive for none.  All other systems are reviewed and negative.   Physical Exam: VS:  BP (!) 128/92   Pulse 81   Ht 5\' 3"  (1.6 m)   Wt 197 lb (89.4 kg)   SpO2 98%   BMI 34.90 kg/m , BMI Body mass index is 34.9 kg/m.  Wt Readings from Last 3 Encounters:  08/29/20 197 lb (89.4 kg)  08/21/20 199 lb (90.3 kg)  07/30/20 199 lb 9.6 oz (90.5 kg)    General: Obese patient appears comfortable at rest. Neck: Supple, no elevated JVP or carotid bruits, no thyromegaly. Lungs: Clear to auscultation, nonlabored breathing at rest. Cardiac: Regular rate and rhythm, no S3 or significant systolic murmur, no pericardial rub. Extremities: No pitting edema, distal pulses 2+. Skin: Warm and dry. Musculoskeletal: No kyphosis. Neuropsychiatric: Alert and oriented x3, affect grossly appropriate.  ECG:  An ECG dated 07/22/2020 was personally reviewed today and demonstrated:  Normal sinus rhythm rate of 86, left axis deviation, possible anterolateral infarct, age undetermined.  Recent Labwork: 07/30/2020: ALT 10; AST 16; BUN 16; Creat 1.10; Hemoglobin 13.6; Platelets 276; Potassium 3.5; Sodium 139  No results found for: CHOL, TRIG, HDL, CHOLHDL, VLDL, LDLCALC, LDLDIRECT  Other Studies Reviewed Today:   Echocardiogram 07/24/2020  1. Left ventricular ejection fraction, by estimation, is 50 to 55%.  The left ventricle has low normal function. The left ventricle has no regional wall motion abnormalities. There is mild left ventricular hypertrophy. Left ventricular diastolic parameters are indeterminate. 2. Right ventricular systolic function is normal. The right ventricular size is normal. Tricuspid regurgitation signal is inadequate for assessing PA pressure. 3. The mitral valve is grossly normal. Trivial mitral valve regurgitation. 4.  The aortic valve is tricuspid. Aortic valve regurgitation is mild. Aortic regurgitation PHT measures 670 msec. 5. Aortic dilatation noted. There is moderate dilatation of the ascending aorta, measuring 44 mm. 6. The inferior vena cava is normal in size with greater than 50% respiratory variability, suggesting right atrial pressure of 3 mmHg. Comparison(s): Previous Echo showed LV EF 55-60%, mild AI.   NST 07/28/2020 Study Result  Narrative & Impression   No diagnostic ST segment changes indicate ischemia.  Small, mild intensity, fixed apical lateral defect that is most consistent with soft tissue attenuation. No large ischemic territories.  This is a low risk study.  Nuclear stress EF: 55%.     Assessment and Plan:    1. Chest pain, unspecified type Discussed stress test today.  No evidence of ischemia and considered low risk.  States her chest pain is better.  2. History of PSVT (paroxysmal supraventricular tachycardia) Currently denies any palpitations.  Continue Toprol-XL 50 mg p.o. twice daily.  3. Essential hypertension, benign Blood pressure today 128/92.  Continue HCTZ 25 mg daily.  Continue Toprol-XL 50 mg p.o. twice daily.  4. CAD in native artery Had mild coronary atherosclerosis via previous work-up.  Low risk stress test 2019.  We are ordering a repeat Lexiscan as noted above in #1 due to recent complaints of chest pain.  5. Mixed hyperlipidemia Patient states she was recently prescribed atorvastatin by PCP.  Continue  atorvastatin 20 mg daily as prescribed by PCP.  Please get a baseline lipid panel  6. SOB (shortness of breath) At last visit she complained of increased dyspnea on exertion over the last 2 months.  Stated she was under a lot of stress.  She had a good friend die as well as 2 of her children over the last 2 years.  She was unsure if the shortness of breath is associated with stress.  Had a recent normal stress test.  Recent echocardiogram demonstrated low normal EF at 50 to 55%Mild LVH, trivial MR, mild AR, moderate dilatation of ascending aorta measuring 44 mm.  States DOE/SOB seems to have gotten worse over the last couple of months.  Please refer to pulmonology for evaluation of shortness of breath.  Medication Adjustments/Labs and Tests Ordered: Current medicines are reviewed at length with the patient today.  Concerns regarding medicines are outlined above.   Disposition: Follow-up with Dr. Domenic Polite or APP 6 months Signed, Levell July, NP 08/29/2020 9:47 AM    [facility] [place], [place], [place] [address] Phone: [phone]; Fax: [phone] is questioning as to whether she should be on the medication or not.

## 2020-08-29 ENCOUNTER — Encounter: Payer: Self-pay | Admitting: Family Medicine

## 2020-08-29 ENCOUNTER — Ambulatory Visit (INDEPENDENT_AMBULATORY_CARE_PROVIDER_SITE_OTHER): Payer: Medicare Other | Admitting: Family Medicine

## 2020-08-29 VITALS — BP 128/92 | HR 81 | Ht 63.0 in | Wt 197.0 lb

## 2020-08-29 DIAGNOSIS — E782 Mixed hyperlipidemia: Secondary | ICD-10-CM

## 2020-08-29 DIAGNOSIS — R079 Chest pain, unspecified: Secondary | ICD-10-CM

## 2020-08-29 DIAGNOSIS — R0602 Shortness of breath: Secondary | ICD-10-CM

## 2020-08-29 DIAGNOSIS — I10 Essential (primary) hypertension: Secondary | ICD-10-CM | POA: Diagnosis not present

## 2020-08-29 DIAGNOSIS — I2510 Atherosclerotic heart disease of native coronary artery without angina pectoris: Secondary | ICD-10-CM

## 2020-08-29 DIAGNOSIS — I471 Supraventricular tachycardia: Secondary | ICD-10-CM

## 2020-08-29 NOTE — Patient Instructions (Signed)
Your physician wants you to follow-up in: [place] will receive a reminder letter in the mail two months in advance. If you don't receive a letter, please call our office to schedule the follow-up appointment.  Your physician recommends that you continue on your current medications as directed. Please refer to the Current Medication list given to you today.  Your physician recommends that you return for lab work  LIPIDS - PLEASE FAST 6-8 HOURS PRIOR TO LAB WORK   You have been referred to PULMONARY   Thank you for choosing [facility]!!

## 2020-09-01 ENCOUNTER — Ambulatory Visit: Payer: Medicare Other | Admitting: Family Medicine

## 2020-09-23 ENCOUNTER — Ambulatory Visit (INDEPENDENT_AMBULATORY_CARE_PROVIDER_SITE_OTHER): Payer: Medicare Other | Admitting: Internal Medicine

## 2020-09-23 ENCOUNTER — Other Ambulatory Visit: Payer: Self-pay

## 2020-09-23 ENCOUNTER — Other Ambulatory Visit: Payer: Self-pay | Admitting: Family Medicine

## 2020-09-23 ENCOUNTER — Encounter: Payer: Self-pay | Admitting: Internal Medicine

## 2020-09-23 DIAGNOSIS — R058 Other specified cough: Secondary | ICD-10-CM

## 2020-09-23 DIAGNOSIS — R0600 Dyspnea, unspecified: Secondary | ICD-10-CM

## 2020-09-23 DIAGNOSIS — R0609 Other forms of dyspnea: Secondary | ICD-10-CM

## 2020-09-23 DIAGNOSIS — E782 Mixed hyperlipidemia: Secondary | ICD-10-CM | POA: Diagnosis not present

## 2020-09-23 LAB — LIPID PANEL
Cholesterol: 223 mg/dL — ABNORMAL HIGH (ref <200)
HDL: 57 mg/dL (ref >=50)
LDL Cholesterol (Calc): 129 mg/dL (calc) — ABNORMAL HIGH
Non-HDL Cholesterol (Calc): 166 mg/dL (calc) — ABNORMAL HIGH (ref <130)
Total CHOL/HDL Ratio: 3.9 (calc) (ref <5.0)
Triglycerides: 220 mg/dL — ABNORMAL HIGH (ref <150)

## 2020-09-23 NOTE — Patient Instructions (Signed)
No pulmonary medicine follow up needed at this time  Consider referral to Dr Ernst Bowler in [place]

## 2020-09-23 NOTE — Assessment & Plan Note (Addendum)
Onset ? 2018 MRI 01/27/17 The paranasal sinuses appear normal - Allergy profile 05/05/2017 >  Eos 0.1 /  IgE 2   RAST neg  - FENO 05/05/2017  =   26 > try off symb and metaprolol and on prn tessilon   Upper airway cough syndrome (previously labeled PNDS),  is so named because it's frequently impossible to sort out how much is  CR/sinusitis with freq throat clearing (which can be related to primary GERD)   vs  causing  secondary (" extra esophageal")  GERD from wide swings in gastric pressure that occur with throat clearing, often  promoting self use of mint and menthol lozenges that reduce the lower esophageal sphincter tone and exacerbate the problem further in a cyclical fashion.   These are the same pts (now being labeled as having "irritable larynx syndrome" by some cough centers) who not infrequently have a history of having failed to tolerate ace inhibitors,  dry powder inhalers or biphosphonates or report having atypical/extraesophageal reflux symptoms that don't respond to standard doses of PPI  and are easily confused as having aecopd or asthma flares by even experienced allergists/ pulmonologists (myself included).   >>> May need to consider  DPI in favor of hfa but note they have failed also in past and doubt she uses either effectively/consistently and I  would have liked to work with her but can't do so if she understand "why do ask so many questions" before before we even start to help her.  The standardized cough guidelines published in Chest by Lissa Morales in 2006 are still the best available and consist of a multiple step process (up to 12!) , not a single office visit,  and are intended  to address this problem logically,  with an alogrithm dependent on response to empiric treatment at  each progressive step  to determine a specific diagnosis with  minimal addtional testing needed. Therefore if adherence is an issue or can't be accurately verified,  it's very unlikely the standard  evaluation and treatment will be successful here.    Furthermore, response to therapy (other than acute cough suppression, which should only be used short term with avoidance of narcotic containing cough syrups if possible), can be a gradual process for which the patient is not likely to  perceive immediate benefit.  Unlike going to an eye doctor where the best prescription (eyeglasses)  is almost always the first one and is immediately effective, this is almost never the case in the management of chronic cough syndromes.    >>> she did not return previously to complete the w/u and doesn't appear likely to now either  Rec:  Refer to Allergy / Dr Ernst Bowler if not willing to work thru this clinic to address her "breathing problems"            Each maintenance medication was reviewed in detail including emphasizing most importantly the difference between maintenance and prns and under what circumstances the prns are to be triggered using an action plan format where appropriate.  Total time for H and P, chart review, counseling, reviewing  and generating customized AVS unique to this office visit / same day charting = 36 min

## 2020-09-23 NOTE — Assessment & Plan Note (Addendum)
Onset Nov 2017 - 10/06/2016   Walked RA x one lap @ 185 stopped due to  Sob no desat, slow pace, some dry cough  - FENO 10/06/2016  =   20  Off symbicort  - Spirometry 10/06/2016  FEV1 1.48 (64%)  Ratio 83 with min curvature in effort indep portion of f/v loop - trial of max rx for gerd 10/06/2016 > no better - CT chest w/o contrast:  Minimal scarring bases / old compression fx T12  - PFT's  02/02/2017  FEV1 1.69 (79 % ) ratio 86  p 0 % improvement from saba p nothing prior to study with DLCO  64/63  % corrects to 100  % for alv volume - ERV 41% 02/02/2017  C/w effects of obesity  - 02/02/2017   Walked RA  2 laps @ 185 ft each stopped due to  Sob at fast pace/ sats 98% at end  05/05/2017  Walked RA x 3 laps @ 185 ft each stopped due to  Sob/ slow  pace, no   desat     Spirometry 05/05/2017  FEV1 1.72  (81%)  Ratio 86  s any rx prior and sob walking slowly   - 09/23/2020 left before completing eval/ walking saturation study as planned   Unable to finish eval today  - pt appears both anxious and depressed and easily aggravated when I was attempting to understand her symptoms but she has same ddx as before:  ? Adverse effects of DPI  - - The proper method of use, as well as anticipated side effects, of an elipta inhaler were discussed and demonstrated to the patient using teach back method.  >>> Should use anoro consistently for a month and rinse gargle after use but not refill it if no better breathing and less need for saba   ? Allergy / asthma seem very unlikely > way over using saba s evidence that it really helps   ? Acid (or non-acid) GERD >  I suspect this problem was made worse by EGD / dilation as created a larger passage headed back toward the airway and not dosing ppi optimally >>>  rec she take ppi  Take 30- 60 min before your first and last meals of the day and follow diet I gave her previously > no fish oil     ? Anxiety/depression/ deconditioning  > usually at the bottom of this list of  usual suspects but should be much higher on this pt's based on H and P and note already on psychotropics and may interfere with adherence and also interpretation of response or lack thereof to symptom management which can be quite subjective.   ? BB effects  >  In the setting of respiratory symptoms of unknown etiology,  It would be preferable to use bystolic, the most beta -1  selective Beta blocker available in sample form, with bisoprolol the most selective generic choice  on the market, at least on a trial basis, to make sure the spillover Beta 2 effects of the less specific Beta blockers are not contributing to this patient's symptoms.

## 2020-09-23 NOTE — Progress Notes (Addendum)
Michelle Zavala, female    DOB: 26-Jun-1946, 75 y.o.   MRN: [id number]   Brief patient profile:  46 yowf never smoker with new doe around 2014 rx by Koleen Nimrod tried nebulizer / hfa  saba and much worse fall of 2021 with sensation of globus and worse doe/ cough so referred to pulmonary clinic in [facility]  09/23/2020 by Levell July.  Note has gerd and underwent dilation 08/21/20 and dysphagia improved but not the globus  Wakes up with copious brown mucus each am x years / no better on anoro but not taking regularly, doesn't like the powder but struggles with the technique  Eval 2018 with simimilar symptoms and pft's c/w  Restrictive/ not obstructive changes  rec Stop symbicort and metaprolol  Start bisoprolol 5 mg each am  For cough > tessilon 200 mg every 6 hours as needed Pantoprazole (protonix) 40 mg   Take  30-60 min before first meal of the day and Pepcid (famotidine)  20 mg one @  bedtime  GERD  Diet  Return in 4 weeks > did not do         History of Present Illness  09/23/2020  Pulmonary/ 1st office eval/ [facility] / [place] [facility]  Chief Complaint  Patient presents with  . Pulmonary Consult    Referred by Levell July, NP. Pt c/o SOB for the past 8 years, worse over the past year. She gets SOB climbing stairs, walking room to room at home, housework. She states that she cough with brown/black sputum in the mornings. She feels she has something stuck in her throat. Also c/o wheezing.   "I can't breathe"   When did it get worse  "it's always been that way"     Q  But my nurse said it was just this past year  -  "Well I've had a rough year, but my breathing is the same"   Several times she changed her hx during the interview then refused to answer additional questions related to details of symptoms.  Not taking ppi ac as rec/ back on lopressor 50 mg bid where previously I had recommended bisoprolol and does not recall this or whether helped her breathing   Checked almost every bullet on  ROS except  for cp or rash.   Past Medical History:  Diagnosis Date  . Anxiety   . Arthritis   . C2 cervical fracture ([place]) 09/17/13   Traumatic fracture witth minimal displacement  . Chronic lung disease    Fibrosis - Dr. Koleen Nimrod  . COPD (chronic obstructive pulmonary disease) ([place])   . Coronary atherosclerosis of native coronary artery    Mild atherosclerosis 3/10, LVEF 60-65%  . Depression   . Diverticulosis   . Essential hypertension   . GERD (gastroesophageal reflux disease)   . PSVT (paroxysmal supraventricular tachycardia) ([facility])     Outpatient Medications Prior to Visit  Medication Sig Dispense Refill  . acetaminophen (TYLENOL) 650 MG CR tablet Take 1,300 mg by mouth every 8 (eight) hours as needed for pain.     [name] albuterol (VENTOLIN HFA) 108 (90 Base) MCG/ACT inhaler Inhale 2 puffs into the lungs every 4 (four) hours as needed for wheezing or shortness of breath.    [name] amitriptyline (ELAVIL) 75 MG tablet Take 75 mg by mouth at bedtime.  6  . Ascorbic Acid (VITAMIN C) 1000 MG tablet Take 1,000 mg by mouth daily.    . cholecalciferol (VITAMIN D3) 25 MCG (1000 UNIT) tablet Take 1,000 Units  by mouth daily.    . hydrochlorothiazide (HYDRODIURIL) 25 MG tablet Take 25 mg by mouth every morning.    [name] ipratropium (ATROVENT) 0.02 % nebulizer solution Take 0.5 mg by nebulization every 4 (four) hours as needed for wheezing or shortness of breath.    [name] LORazepam (ATIVAN) 1 MG tablet Take 1 mg by mouth 2 (two) times daily.    . metoprolol succinate (TOPROL-XL) 50 MG 24 hr tablet Take 50 mg by mouth daily. Take with or immediately following a meal.    . Multiple Vitamins-Minerals (PRESERVISION AREDS PO) Take 1 tablet by mouth in the morning and at bedtime.     . Omega-3 Fatty Acids (FISH OIL) 1000 MG CAPS Take 3,000 mg by mouth daily.     . pantoprazole (PROTONIX) 40 MG tablet Take 1 tablet (40 mg total) by mouth 2 (two) times daily. 60 tablet 1  . potassium chloride SA (KLOR-CON) 20 MEQ  tablet Take 20 mEq by mouth daily.    . sertraline (ZOLOFT) 100 MG tablet Take 100 mg by mouth daily.    . sertraline (ZOLOFT) 100 MG tablet Take 100 mg by mouth daily.    [name] umeclidinium-vilanterol (ANORO ELLIPTA) 62.5-25 MCG/INH AEPB Inhale 1 puff into the lungs daily as needed (asthma).     . vitamin B-12 (CYANOCOBALAMIN) 1000 MCG tablet Take 1,000 mcg by mouth daily.    . ATORVASTATIN CALCIUM PO Take by mouth.    . dicyclomine (BENTYL) 10 MG capsule Take 1 capsule (10 mg total) by mouth 4 (four) times daily -  before meals and at bedtime. For abdominal cramping and loose stool (Patient taking differently: Take 10 mg by mouth 3 (three) times daily as needed for spasms. For abdominal cramping and loose stool) 120 capsule 3  . fluticasone (FLONASE) 50 MCG/ACT nasal spray Place 2 sprays into both nostrils daily as needed for allergies or rhinitis.    [name] ipratropium-albuterol (DUONEB) 0.5-2.5 (3) MG/3ML SOLN Take 3 mLs by nebulization every 4 (four) hours as needed (asthma).     . Vitamin D, Ergocalciferol, (DRISDOL) 50000 units CAPS capsule Take 50,000 Units by mouth every 7 (seven) days. Saturday     No facility-administered medications prior to visit.     Objective:     BP 122/84 (BP Location: Left Arm, Cuff Size: Normal)   Pulse 94   Temp 98 F (36.7 C) (Temporal)   Ht 5\' 3"  (1.6 m)   Wt 202 lb (91.6 kg)   SpO2 97% Comment: on RA  BMI 35.78 kg/m   SpO2: 97 % (on RA)   Obese wf with inconsistent answers to questions about her breathing, exam done w/in 3 hours of saba hfa   HEENT : pt wearing mask not removed for exam due to covid -19 concerns.    NECK :  without JVD/Nodes/TM/ nl carotid upstrokes bilaterally   LUNGS: no acc muscle use,  Nl contour chest which is clear to A and P bilaterally without cough on insp or exp maneuvers   CV:  RRR  no s3 or murmur or increase in P2, and no edema   ABD:  obese  soft and nontender with nl inspiratory excursion in the supine position.  No bruits or organomegaly appreciated, bowel sounds nl  MS:  Nl gait/ ext warm without deformities, calf tenderness, cyanosis or clubbing No obvious joint restrictions   SKIN: warm and dry without lesions    NEURO:  Alert, easily confused with details of care  with  no motor or cerebellar deficits apparent.   cxr's and  labs >>> refused because agree "about all the questions you're asking"  "something is wrong with you"       Assessment   Dyspnea on exertion Onset Nov 2017 - 10/06/2016   Walked RA x one lap @ 185 stopped due to  Sob no desat, slow pace, some dry cough  - FENO 10/06/2016  =   20  Off symbicort  - Spirometry 10/06/2016  FEV1 1.48 (64%)  Ratio 83 with min curvature in effort indep portion of f/v loop - trial of max rx for gerd 10/06/2016 > no better - CT chest w/o contrast:  Minimal scarring bases / old compression fx T12  - PFT's  02/02/2017  FEV1 1.69 (79 % ) ratio 86  p 0 % improvement from saba p nothing prior to study with DLCO  64/63  % corrects to 100  % for alv volume - ERV 41% 02/02/2017  C/w effects of obesity  - 02/02/2017   Walked RA  2 laps @ 185 ft each stopped due to  Sob at fast pace/ sats 98% at end  05/05/2017  Walked RA x 3 laps @ 185 ft each stopped due to  Sob/ slow  pace, no   desat     Spirometry 05/05/2017  FEV1 1.72  (81%)  Ratio 86  s any rx prior and sob walking slowly   - 09/23/2020 left before completing eval/ walking saturation study as planned   Unable to finish eval today  - pt appears both anxious and depressed and easily aggravated when I was attempting to understand her symptoms but she has same ddx as before:  ? Adverse effects of DPI  - - The proper method of use, as well as anticipated side effects, of an elipta inhaler were discussed and demonstrated to the patient using teach back method.  >>> Should use anoro consistently for a month and rinse gargle after use but not refill it if no better breathing and less need for saba   ? Allergy /  asthma seem very unlikely > way over using saba s evidence that it really helps   ? Acid (or non-acid) GERD >  I suspect this problem was made worse by EGD / dilation as created a larger passage headed back toward the airway and not dosing ppi optimally >>>  rec she take ppi  Take 30- 60 min before your first and last meals of the day and follow diet I gave her previously > no fish oil     ? Anxiety/depression/ deconditioning  > usually at the bottom of this list of usual suspects but should be much higher on this pt's based on H and P and note already on psychotropics and may interfere with adherence and also interpretation of response or lack thereof to symptom management which can be quite subjective.   ? BB effects  >  In the setting of respiratory symptoms of unknown etiology,  It would be preferable to use bystolic, the most beta -1  selective Beta blocker available in sample form, with bisoprolol the most selective generic choice  on the market, at least on a trial basis, to make sure the spillover Beta 2 effects of the less specific Beta blockers are not contributing to this patient's symptoms.       Upper airway cough syndrome Onset ? 2018 MRI 01/27/17 The paranasal sinuses appear normal - Allergy profile 05/05/2017 >  Eos  0.1 /  IgE 2   RAST neg  - FENO 05/05/2017  =   26 > try off symb and metaprolol and on prn tessilon   Upper airway cough syndrome (previously labeled PNDS),  is so named because it's frequently impossible to sort out how much is  CR/sinusitis with freq throat clearing (which can be related to primary GERD)   vs  causing  secondary (" extra esophageal")  GERD from wide swings in gastric pressure that occur with throat clearing, often  promoting self use of mint and menthol lozenges that reduce the lower esophageal sphincter tone and exacerbate the problem further in a cyclical fashion.   These are the same pts (now being labeled as having "irritable larynx syndrome"  by some cough centers) who not infrequently have a history of having failed to tolerate ace inhibitors,  dry powder inhalers or biphosphonates or report having atypical/extraesophageal reflux symptoms that don't respond to standard doses of PPI  and are easily confused as having aecopd or asthma flares by even experienced allergists/ pulmonologists (myself included).   >>> May need to consider  DPI in favor of hfa but note they have failed also in past and doubt she uses either effectively/consistently and I  would have liked to work with her but can't do so if she understand "why do ask so many questions" before before we even start to help her.  The standardized cough guidelines published in Chest by Lissa Morales in 2006 are still the best available and consist of a multiple step process (up to 12!) , not a single office visit,  and are intended  to address this problem logically,  with an alogrithm dependent on response to empiric treatment at  each progressive step  to determine a specific diagnosis with  minimal addtional testing needed. Therefore if adherence is an issue or can't be accurately verified,  it's very unlikely the standard evaluation and treatment will be successful here.    Furthermore, response to therapy (other than acute cough suppression, which should only be used short term with avoidance of narcotic containing cough syrups if possible), can be a gradual process for which the patient is not likely to  perceive immediate benefit.  Unlike going to an eye doctor where the best prescription (eyeglasses)  is almost always the first one and is immediately effective, this is almost never the case in the management of chronic cough syndromes.    >>> she did not return previously to complete the w/u and doesn't appear likely to now either  Rec:  Refer to Allergy / Dr Ernst Bowler if not willing to work thru this clinic to address her "breathing problems"            Each maintenance  medication was reviewed in detail including emphasizing most importantly the difference between maintenance and prns and under what circumstances the prns are to be triggered using an action plan format where appropriate.  Total time for H and P, chart review, counseling, reviewing  and generating customized AVS unique to this office visit / same day charting = 36 min          Christinia Gully, MD 09/23/2020

## 2020-09-26 DIAGNOSIS — M10071 Idiopathic gout, right ankle and foot: Secondary | ICD-10-CM | POA: Diagnosis not present

## 2020-09-26 DIAGNOSIS — J449 Chronic obstructive pulmonary disease, unspecified: Secondary | ICD-10-CM | POA: Diagnosis not present

## 2020-09-26 DIAGNOSIS — M109 Gout, unspecified: Secondary | ICD-10-CM | POA: Diagnosis not present

## 2020-09-26 DIAGNOSIS — Z791 Long term (current) use of non-steroidal anti-inflammatories (NSAID): Secondary | ICD-10-CM | POA: Diagnosis not present

## 2020-09-26 DIAGNOSIS — Z7952 Long term (current) use of systemic steroids: Secondary | ICD-10-CM | POA: Diagnosis not present

## 2020-09-26 DIAGNOSIS — M79674 Pain in right toe(s): Secondary | ICD-10-CM | POA: Diagnosis not present

## 2020-09-26 DIAGNOSIS — I10 Essential (primary) hypertension: Secondary | ICD-10-CM | POA: Diagnosis not present

## 2020-09-29 ENCOUNTER — Other Ambulatory Visit (HOSPITAL_COMMUNITY): Payer: Medicare Other

## 2020-09-29 ENCOUNTER — Telehealth: Payer: Self-pay | Admitting: *Deleted

## 2020-09-29 NOTE — Telephone Encounter (Signed)
-----   Message from Merlene Laughter, RN sent at 09/24/2020  2:18 PM EST -----  ----- Message ----- From: Verta Ellen., NP Sent: 09/24/2020   2:14 PM EST To: Merlene Laughter, RN  Total Cholesterol 223, Trigylcerides 220, LDL 129. She was recently started on Lipitor 20 mg daily. This was a baseline lab. Need to repeat in 8 weeks to check for improvement. Thank You

## 2020-09-29 NOTE — Telephone Encounter (Signed)
Laurine Blazer, LPN  11/11/3141 8:88 PM EST Back to Top     .lm

## 2020-10-10 NOTE — Telephone Encounter (Signed)
No answer

## 2020-10-16 DIAGNOSIS — M7062 Trochanteric bursitis, left hip: Secondary | ICD-10-CM | POA: Diagnosis not present

## 2020-10-16 DIAGNOSIS — M7061 Trochanteric bursitis, right hip: Secondary | ICD-10-CM | POA: Diagnosis not present

## 2020-10-16 DIAGNOSIS — S93502A Unspecified sprain of left great toe, initial encounter: Secondary | ICD-10-CM | POA: Diagnosis not present

## 2020-10-22 ENCOUNTER — Encounter: Payer: Self-pay | Admitting: Gastroenterology

## 2020-10-22 ENCOUNTER — Other Ambulatory Visit: Payer: Self-pay

## 2020-10-22 ENCOUNTER — Ambulatory Visit (INDEPENDENT_AMBULATORY_CARE_PROVIDER_SITE_OTHER): Payer: Medicare Other | Admitting: Gastroenterology

## 2020-10-22 VITALS — BP 130/85 | HR 76 | Temp 96.8°F | Ht 63.0 in | Wt 196.8 lb

## 2020-10-22 DIAGNOSIS — I10 Essential (primary) hypertension: Secondary | ICD-10-CM | POA: Diagnosis not present

## 2020-10-22 DIAGNOSIS — K219 Gastro-esophageal reflux disease without esophagitis: Secondary | ICD-10-CM | POA: Diagnosis not present

## 2020-10-22 DIAGNOSIS — K5900 Constipation, unspecified: Secondary | ICD-10-CM

## 2020-10-22 DIAGNOSIS — Z0000 Encounter for general adult medical examination without abnormal findings: Secondary | ICD-10-CM | POA: Diagnosis not present

## 2020-10-22 NOTE — Progress Notes (Signed)
Referring Provider: Neale Burly, MD Primary Care Physician:  Neale Burly, MD Primary GI: Dr. Gala Romney   Chief Complaint  Patient presents with  . Constipation    Last good bm 1 week ago. "gets to bottom and won't come out, have to dig it out"  . Gastroesophageal Reflux    HPI:   Michelle Zavala is a 75 y.o. female presenting today with a history of chronic abdominal pain,diarrhea, dysphagia. Prior extensive evaluation listed below. Most recently EGD in Dec 2021 with  normal esophagus s/p dilation. Erythematous mucosa in stomach of doubtful clinical significant s/p biopsy. Negative H.pylori. Mild reactive gastropathy.    Has to pick stool out of rectum. Hasn't had a good BM since last Wednesday. Taking stool softeners and Benefiber. Wants a colonoscopy. Thinks she may have seen blood.   GERD: has a burning at times in esophagus. Protonix BID. However, she feels like this is improved overall.   She has lost both her children in the past year. Recently lost her daughter-in-law. Long-time partner passed away recently. She is looking into retirement homes.     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 Progression of SMA disease could cause issues, but clinical monitoring recommended per IR. She was seen by Vascular Surgery due to renal artery disease as well. Medical therapy recommended. Symptomatic fat-containing ventral hernia noted at visit in March 2017, undergoing hernia repair in March 2017.TCS/EGD with negative segmental colonic biopsies, needing surveillance in 5 years due to colonic adenomas. EGD with empiric dilatation, small hiatal hernia. Celiac serologies negative. 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 Negative hydrogen breath test for small bowel bacterial overgrowth.CTA updated 08/24/17: mixed plaque at origin of SMA but not felt to be greater than 50%. Celiac and IMA remained  patent. Discussed with Dr. Earleen Newport, who recommended IR outpatient clinic evaluation. She was not felt to have chronic mesenteric ischemia.CT abd/pelvis with contrast Jan 2021 no acute findings. Most recent EGD Dec 2021.    Past Medical History:  Diagnosis Date  . Anxiety   . Arthritis   . C2 cervical fracture ([place]) 09/17/13   Traumatic fracture witth minimal displacement  . Chronic lung disease    Fibrosis - Dr. Koleen Nimrod  . COPD (chronic obstructive pulmonary disease) ([place])   . Coronary atherosclerosis of native coronary artery    Mild atherosclerosis 3/10, LVEF 60-65%  . Depression   . Diverticulosis   . Essential hypertension   . GERD (gastroesophageal reflux disease)   . PSVT (paroxysmal supraventricular tachycardia) ([place])     Past Surgical History:  Procedure Laterality Date  . ABDOMINAL HYSTERECTOMY    . ANTERIOR RELEASE VERTEBRAL BODY W/ POSTERIOR FUSION    . BACTERIAL OVERGROWTH TEST N/A 02/19/2016   Procedure: BACTERIAL OVERGROWTH TEST;  Surgeon: Daneil Dolin, MD;  [facility];  Service: Endoscopy;  Laterality: N/A;  0700  . BIOPSY N/A 08/04/2015   Procedure: BIOPSY;  Surgeon: Daneil Dolin, MD;  Location: [facility];  Service: Endoscopy;  Laterality: N/A;  Gastric  . BIOPSY  02/21/2017   Procedure: BIOPSY;  Surgeon: Daneil Dolin, MD;  [facility];  Service: Endoscopy;;  ascending colon biopsy   . BIOPSY  08/21/2020   Procedure: BIOPSY;  Surgeon: Daneil Dolin, MD;  [facility];  Service: Endoscopy;;  . cataract surgery    . CESAREAN SECTION    .  CHOLECYSTECTOMY  1973  . COLONOSCOPY  June 2016   Dr. Britta Mccreedy: moderate diverticulosis in sigmoid colon, surveillance in 5 years   . COLONOSCOPY WITH PROPOFOL N/A 02/21/2017   Dr. Gala Romney: diverticulosis in sigmoid colon, tubular adenomas, segmental biopsies benign. Surveillance in 5 years   . ESOPHAGOGASTRODUODENOSCOPY (EGD) WITH PROPOFOL N/A 08/04/2015   Dr. Rourk:abnormal gastric mucosa s/p  biopsy. Reactive gastropathy. Negative H.pylori  . ESOPHAGOGASTRODUODENOSCOPY (EGD) WITH PROPOFOL N/A 02/21/2017   Dr. Gala Romney: empiric esophageal dilatation, small hiatal hernia, otherwise normal  . ESOPHAGOGASTRODUODENOSCOPY (EGD) WITH PROPOFOL N/A 08/21/2020   normal esophagus s/p dilation. Erythematous mucosa in stomach of doubtful clinical significant s/p biopsy. Negative H.pylori. Mild reactive gastropathy.   [name] HERNIA REPAIR N/A 12/03/2015   Procedure: [name] HERNIORRHAPHY WITH MESH;  Surgeon: Aviva Signs, MD;  Location: [facility];  Service: General;  Laterality: N/A;  . INSERTION OF MESH  12/03/2015   Procedure: INSERTION OF MESH;  Surgeon: Aviva Signs, MD;  Location: [facility];  Service: General;;  . IR RADIOLOGIST EVAL & MGMT  09/22/2017  . MALONEY DILATION N/A 02/21/2017   Procedure: [name] DILATION;  Surgeon: Daneil Dolin, MD;  [facility];  Service: Endoscopy;  Laterality: N/A;  . [name] DILATION N/A 08/21/2020   Procedure: [name] DILATION;  Surgeon: Daneil Dolin, MD;  [facility];  Service: Endoscopy;  Laterality: N/A;  . POLYPECTOMY  02/21/2017   Procedure: POLYPECTOMY;  Surgeon: Daneil Dolin, MD;  [facility];  Service: Endoscopy;;  hepatic flexure polyp cs  . TMJ ARTHROSCOPY Bilateral 02/04/2015   Procedure: BILATERAL TEMPOROMANDIBULAR JOINT (TMJ) ARTHROSCOPY MENISECTOMY WITH FAT GRAFT FROM ABDOMEN ;  Surgeon: Diona Browner, DDS;  Location: [place];  Service: Oral Surgery;  Laterality: Bilateral;  . TOTAL KNEE ARTHROPLASTY Bilateral   . TUBAL LIGATION      Current Outpatient Medications  Medication Sig Dispense Refill  . acetaminophen (TYLENOL) 650 MG CR tablet Take 1,300 mg by mouth every 8 (eight) hours as needed for pain.     [name] albuterol (VENTOLIN HFA) 108 (90 Base) MCG/ACT inhaler Inhale 2 puffs into the lungs every 4 (four) hours as needed for wheezing or shortness of breath.    [name] amitriptyline (ELAVIL) 75 MG tablet Take 75  mg by mouth at bedtime.  6  . Ascorbic Acid (VITAMIN C) 1000 MG tablet Take 1,000 mg by mouth daily.    . cholecalciferol (VITAMIN D3) 25 MCG (1000 UNIT) tablet Take 1,000 Units by mouth daily.    [name] Calcium (STOOL SOFTENER PO) Take by mouth daily at 12 noon. Takes 2 daily    . hydrochlorothiazide (HYDRODIURIL) 25 MG tablet Take 25 mg by mouth every morning.    [name] ipratropium (ATROVENT) 0.02 % nebulizer solution Take 0.5 mg by nebulization every 4 (four) hours as needed for wheezing or shortness of breath.    [name] LORazepam (ATIVAN) 1 MG tablet Take 1 mg by mouth 2 (two) times daily.    . metoprolol succinate (TOPROL-XL) 50 MG 24 hr tablet Take 50 mg by mouth daily. Take with or immediately following a meal.    . Multiple Vitamins-Minerals (PRESERVISION AREDS PO) Take 1 tablet by mouth in the morning and at bedtime.     . Omega-3 Fatty Acids (FISH OIL) 1000 MG CAPS Take 3,000 mg by mouth daily.     . pantoprazole (PROTONIX) 40 MG tablet Take 1 tablet (40 mg total) by mouth 2 (two) times daily. 60 tablet 1  .  potassium chloride SA (KLOR-CON) 20 MEQ tablet Take 20 mEq by mouth daily.    . sertraline (ZOLOFT) 100 MG tablet Take 100 mg by mouth daily.    . sertraline (ZOLOFT) 100 MG tablet Take 100 mg by mouth daily.    [name] umeclidinium-vilanterol (ANORO ELLIPTA) 62.5-25 MCG/INH AEPB Inhale 1 puff into the lungs daily as needed (asthma).     . vitamin B-12 (CYANOCOBALAMIN) 1000 MCG tablet Take 1,000 mcg by mouth daily.    . Wheat Dextrin (BENEFIBER) POWD Take by mouth in the morning and at bedtime. Takes 3 Tbsp twice daily     No current facility-administered medications for this visit.    Allergies as of 10/22/2020 - Review Complete 10/22/2020  Allergen Reaction Noted  . Sulfonamide derivatives Hives     Family History  Problem Relation Age of Onset  . Coronary artery disease Father        Premature  . Heart disease Father   . Coronary artery disease Brother        Premature  .  Coronary artery disease Sister        Premature  . Colon cancer Son     Social History   Socioeconomic History  . Marital status: Widowed    Spouse name: Not on file  . Number of children: 3  . Years of education: 7  . Highest education level: Not on file  Occupational History  . Occupation: Retired  Tobacco Use  . Smoking status: Never Smoker  . Smokeless tobacco: Never Used  Vaping Use  . Vaping Use: Never used  Substance and Sexual Activity  . Alcohol use: No    Alcohol/week: 0.0 standard drinks  . Drug use: No  . Sexual activity: Never    Birth control/protection: None, Surgical  Other Topics Concern  . Not on file  Social History Narrative   Occasionally drinks [employer]    Social Determinants of Health   Financial Resource Strain: Not on file  Food Insecurity: Not on file  Transportation Zavala: Not on file  Physical Activity: Not on file  Stress: Not on file  Social Connections: Not on file    Review of Systems: Gen: Denies fever, chills, anorexia. Denies fatigue, weakness, weight loss.  CV: Denies chest pain, palpitations, syncope, peripheral edema, and claudication. Resp: Denies dyspnea at rest, cough, wheezing, coughing up blood, and pleurisy. GI: see HPI Derm: Denies rash, itching, dry skin Psych: Denies depression, anxiety, memory loss, confusion. No homicidal or suicidal ideation.  Heme: Denies bruising, bleeding, and enlarged lymph nodes.  Physical Exam: BP 130/85   Pulse 76   Temp (!) 96.8 F (36 C) (Temporal)   Ht 5\' 3"  (1.6 m)   Wt 196 lb 12.8 oz (89.3 kg)   BMI 34.86 kg/m  General:   Alert and oriented. No distress noted. Pleasant and cooperative.  Head:  Normocephalic and atraumatic. Eyes:  Conjuctiva clear without scleral icterus. Mouth:  Mask in place Abdomen:  +BS, soft, non-tender and non-distended. No rebound or guarding. No HSM or masses noted. Rectal: soft stool in rectal vault. No mass. Somewhat lax sphincter tone Msk:   Symmetrical without gross deformities. Normal posture. Extremities:  Without edema. Neurologic:  Alert and  oriented x4 Psych:  Alert and cooperative. Normal mood and affect.  ASSESSMENT: Robynne Roat is a 75 y.o. female presenting today with a history of chronic abdominal pain,alternating diarrhea and constipation, dysphagia, now with worsening constipation.  Rectal exam without impaction. I have asked her  to start a Miralax prep today, then start Linzess 145 mcg daily. She is desiring a colonoscopy but not due for one yet (last in 2018), but we will discuss this at her next visit. No alarm signs/symptoms.   GERD: continue PPI BID   PLAN:  Miralax purge today Linzess 145 mcg daily starting tomorrow Continue PPI BID Return in 4-6 weeks  Michelle Needs, PhD, [facility]

## 2020-10-22 NOTE — Telephone Encounter (Signed)
Laurine Blazer, LPN  04/18/4846 2:07 PM EST Back to Top     LM X 2

## 2020-10-22 NOTE — Patient Instructions (Signed)
Today, I want you to take one capful of Miralax in 8 ounces of water every hour up to 6 doses. This should jump start you having bowel movements.  Tomorrow, you can start Linzess 1 capsule each morning on an empty stomach.  It is normal to have some looser stool starting out for the first few days, but this should improve. If it does not, please call us, as we will need to adjust the dosage. Let us know how this works for you!  We will see you back in 4-6 weeks and consider arranging colonoscopy at that time, but we need to get your bowel habits better!  I enjoyed seeing you again today! As you know, I value our relationship and want to provide genuine, compassionate, and quality care. I welcome your feedback. If you receive a survey regarding your visit,  I greatly appreciate you taking time to fill this out. See you next time!  Annitta Needs, PhD, ANP-BC [facility]

## 2020-10-27 NOTE — Telephone Encounter (Signed)
LM x3  

## 2020-10-28 ENCOUNTER — Telehealth: Payer: Self-pay | Admitting: Family Medicine

## 2020-10-28 NOTE — Telephone Encounter (Signed)
Patient is returning a call to [place].  

## 2020-10-28 NOTE — Telephone Encounter (Signed)
Patient states that she is returning a call to [place]. Patient states that she needs to discuss some aching pains in her breast area.

## 2020-10-28 NOTE — Telephone Encounter (Signed)
Laurine Blazer, LPN  04/03/5749 5:18 PM EST Back to Top     Patient notified via detailed voice message.

## 2020-10-28 NOTE — Telephone Encounter (Signed)
[place], [place], PA-C  [place], Debara Pickett, LPN Covering for Jonni Sanger - She could try cutting the tablet in half and taking 10mg  daily or could switch to Crestor 10mg  daily to see if she tolerates this better.

## 2020-10-28 NOTE — Telephone Encounter (Signed)
Reports pain underneath left breast near ribs. Denies chest pain. Denies redness or discharge from breast. Denies dizziness. Reports SOB all the time from her COPD. Advised that referral placed for pulmonologist in December, she was contacted but never returned their call. Advised that message would be sent to [facility] to f/u on pulmonology referral. Advised to contact PCP about pain near ribs. First available appointment given to see Michelle Dung,  NP on 10/31/20 @8 :3 am. Advised if symptoms got worse, to go to the ED for an evaluation. Verbalized understanding of plan.

## 2020-10-30 NOTE — Progress Notes (Unsigned)
Cardiology Office Note  Date: 10/31/2020   ID: Michelle Zavala, DOB 1945-12-08, MRN [id number]  PCP:  Neale Burly, MD  Cardiologist:  Rozann Lesches, MD Electrophysiologist:  None   Chief Complaint: Left breast pain, shortness of breath, discuss changing statin medication   History of Present Illness: Michelle Zavala is a 75 y.o. female with a history of PSVT, CAD, HTN, COPD.[name]  Last encounter with Dr. Domenic Polite 10/05/2019.  She did not report any anginal symptoms or palpitations.  Reported lack of energy and initiated.  Taking Zoloft for depression.  She had lost 2 of her children and prior year.  She was continuing Toprol-XL and omega-3 supplements.  EKG was reviewed showing sinus rhythm with borderline prolonged PR intervals and sinus arrhythmia.  She was continuing HCTZ with potassium supplements.  Mild CAD per previous work-up.  Myoview study May 2019 low risk.  She was last here for follow-up status post recent stress test for complaints of chest pain on and off since October.  Stress test was considered to be low risk.  Stated she was continuing to have some dyspnea on exertion.  She is under significant amount of stress due to loss of 2 children and her significant other.  Denied any chest pain.  No orthostatic symptoms, CVA or TIA-like symptoms, PND, orthopnea, bleeding issues, DVT or PE-like symptoms, lower extremity edema.  Denied any symptoms of sleep apnea i.e. snoring, apneic episodes or excessive daytime sleepiness.  She recently saw pulmonology who suggested changing Toprol to bisoprolol which might alleviate some of the shortness of breath symptoms she was having.  She also is having issues with taking Lipitor with side effects.  It was suggested she change to Crestor to see how well she would tolerate this medication.  She continues to complain of some chronic dyspnea.  She continues to be under a lot of stress.  She is having to move from her house to a senior community.  She has  had some deaths in her family recently.  She complains of some intermittent pain under her left breast without radiation or associated symptoms.  She had a negative stress November 2021.    Past Medical History:  Diagnosis Date  . Anxiety   . Arthritis   . C2 cervical fracture ([place]) 09/17/13   Traumatic fracture witth minimal displacement  . Chronic lung disease    Fibrosis - Dr. Koleen Nimrod  . COPD (chronic obstructive pulmonary disease) ([place])   . Coronary atherosclerosis of native coronary artery    Mild atherosclerosis 3/10, LVEF 60-65%  . Depression   . Diverticulosis   . Essential hypertension   . GERD (gastroesophageal reflux disease)   . PSVT (paroxysmal supraventricular tachycardia) ([place])     Past Surgical History:  Procedure Laterality Date  . ABDOMINAL HYSTERECTOMY    . ANTERIOR RELEASE VERTEBRAL BODY W/ POSTERIOR FUSION    . BACTERIAL OVERGROWTH TEST N/A 02/19/2016   Procedure: BACTERIAL OVERGROWTH TEST;  Surgeon: Daneil Dolin, MD;  [facility];  Service: Endoscopy;  Laterality: N/A;  0700  . BIOPSY N/A 08/04/2015   Procedure: BIOPSY;  Surgeon: Daneil Dolin, MD;  Location: [facility];  Service: Endoscopy;  Laterality: N/A;  Gastric  . BIOPSY  02/21/2017   Procedure: BIOPSY;  Surgeon: Daneil Dolin, MD;  [facility];  Service: Endoscopy;;  ascending colon biopsy   . BIOPSY  08/21/2020   Procedure: BIOPSY;  Surgeon: Daneil Dolin, MD;  [facility];  Service: Endoscopy;;  . cataract surgery    . CESAREAN SECTION    . CHOLECYSTECTOMY  1973  . COLONOSCOPY  June 2016   Dr. Britta Mccreedy: moderate diverticulosis in sigmoid colon, surveillance in 5 years   . COLONOSCOPY WITH PROPOFOL N/A 02/21/2017   Dr. Gala Romney: diverticulosis in sigmoid colon, tubular adenomas, segmental biopsies benign. Surveillance in 5 years   . ESOPHAGOGASTRODUODENOSCOPY (EGD) WITH PROPOFOL N/A 08/04/2015   Dr. Rourk:abnormal gastric mucosa s/p biopsy. Reactive gastropathy.  Negative H.pylori  . ESOPHAGOGASTRODUODENOSCOPY (EGD) WITH PROPOFOL N/A 02/21/2017   Dr. Gala Romney: empiric esophageal dilatation, small hiatal hernia, otherwise normal  . ESOPHAGOGASTRODUODENOSCOPY (EGD) WITH PROPOFOL N/A 08/21/2020   normal esophagus s/p dilation. Erythematous mucosa in stomach of doubtful clinical significant s/p biopsy. Negative H.pylori. Mild reactive gastropathy.   [name] HERNIA REPAIR N/A 12/03/2015   Procedure: [name] HERNIORRHAPHY WITH MESH;  Surgeon: Aviva Signs, MD;  Location: [facility];  Service: General;  Laterality: N/A;  . INSERTION OF MESH  12/03/2015   Procedure: INSERTION OF MESH;  Surgeon: Aviva Signs, MD;  Location: [facility];  Service: General;;  . IR RADIOLOGIST EVAL & MGMT  09/22/2017  . MALONEY DILATION N/A 02/21/2017   Procedure: [name] DILATION;  Surgeon: Daneil Dolin, MD;  [facility];  Service: Endoscopy;  Laterality: N/A;  . [name] DILATION N/A 08/21/2020   Procedure: [name] DILATION;  Surgeon: Daneil Dolin, MD;  [facility];  Service: Endoscopy;  Laterality: N/A;  . POLYPECTOMY  02/21/2017   Procedure: POLYPECTOMY;  Surgeon: Daneil Dolin, MD;  [facility];  Service: Endoscopy;;  hepatic flexure polyp cs  . TMJ ARTHROSCOPY Bilateral 02/04/2015   Procedure: BILATERAL TEMPOROMANDIBULAR JOINT (TMJ) ARTHROSCOPY MENISECTOMY WITH FAT GRAFT FROM ABDOMEN ;  Surgeon: Diona Browner, DDS;  Location: [facility];  Service: Oral Surgery;  Laterality: Bilateral;  . TOTAL KNEE ARTHROPLASTY Bilateral   . TUBAL LIGATION      Current Outpatient Medications  Medication Sig Dispense Refill  . acetaminophen (TYLENOL) 650 MG CR tablet Take 1,300 mg by mouth every 8 (eight) hours as needed for pain.     [name] albuterol (VENTOLIN HFA) 108 (90 Base) MCG/ACT inhaler Inhale 2 puffs into the lungs every 4 (four) hours as needed for wheezing or shortness of breath.    [name] amitriptyline (ELAVIL) 75 MG tablet Take 75 mg by mouth at bedtime.  6  .  Ascorbic Acid (VITAMIN C) 1000 MG tablet Take 1,000 mg by mouth daily.    . bisoprolol (ZEBETA) 5 MG tablet Take 1 tablet (5 mg total) by mouth daily. 30 tablet 6  . cholecalciferol (VITAMIN D3) 25 MCG (1000 UNIT) tablet Take 1,000 Units by mouth daily.    [name] Calcium (STOOL SOFTENER PO) Take by mouth daily at 12 noon. Takes 2 daily    . hydrochlorothiazide (HYDRODIURIL) 25 MG tablet Take 25 mg by mouth every morning.    [name] ipratropium (ATROVENT) 0.02 % nebulizer solution Take 0.5 mg by nebulization every 4 (four) hours as needed for wheezing or shortness of breath.    [name] LORazepam (ATIVAN) 1 MG tablet Take 1 mg by mouth 2 (two) times daily.    . Multiple Vitamins-Minerals (PRESERVISION AREDS PO) Take 1 tablet by mouth in the morning and at bedtime.     . Omega-3 Fatty Acids (FISH OIL) 1000 MG CAPS Take 3,000 mg by mouth daily.     . pantoprazole (PROTONIX) 40 MG tablet Take 1 tablet (  40 mg total) by mouth 2 (two) times daily. 60 tablet 1  . potassium chloride SA (KLOR-CON) 20 MEQ tablet Take 20 mEq by mouth daily.    . rosuvastatin (CRESTOR) 5 MG tablet Take 1 tablet (5 mg total) by mouth daily. 30 tablet 6  . sertraline (ZOLOFT) 100 MG tablet Take 100 mg by mouth daily.    [name] umeclidinium-vilanterol (ANORO ELLIPTA) 62.5-25 MCG/INH AEPB Inhale 1 puff into the lungs daily as needed (asthma).     . vitamin B-12 (CYANOCOBALAMIN) 1000 MCG tablet Take 1,000 mcg by mouth daily.    . Wheat Dextrin (BENEFIBER) POWD Take by mouth in the morning and at bedtime. Takes 3 Tbsp twice daily     No current facility-administered medications for this visit.   Allergies:  Sulfonamide derivatives   Social History: The patient  reports that she has never smoked. She has never used smokeless tobacco. She reports that she does not drink alcohol and does not use drugs.   Family History: The patient's family history includes Colon cancer in her son; Coronary artery disease in her brother, father, and sister;  Heart disease in her father.   ROS:  Please see the history of present illness. Otherwise, complete review of systems is positive for none.  All other systems are reviewed and negative.   Physical Exam: VS:  BP 118/78   Pulse 90   Ht 5\' 5"  (1.651 m)   Wt 191 lb (86.6 kg)   SpO2 98%   BMI 31.78 kg/m , BMI Body mass index is 31.78 kg/m.  Wt Readings from Last 3 Encounters:  10/31/20 191 lb (86.6 kg)  10/22/20 196 lb 12.8 oz (89.3 kg)  09/23/20 202 lb (91.6 kg)    General: Obese patient appears comfortable at rest. Neck: Supple, no elevated JVP or carotid bruits, no thyromegaly. Lungs: Clear to auscultation, nonlabored breathing at rest. Cardiac: Regular rate and rhythm, no S3 or significant systolic murmur, no pericardial rub. Extremities: No pitting edema, distal pulses 2+. Skin: Warm and dry. Musculoskeletal: No kyphosis. Neuropsychiatric: Alert and oriented x3, affect grossly appropriate.  ECG:  An ECG dated 10/31/2020 was personally reviewed today and demonstrated:  Sinus rhythm with premature atrial complexes, left anterior fascicular block, nonspecific ST and T wave abnormality, rate of 92.  Recent Labwork: 07/30/2020: ALT 10; AST 16; BUN 16; Creat 1.10; Hemoglobin 13.6; Platelets 276; Potassium 3.5; Sodium 139     Component Value Date/Time   CHOL 223 (H) 09/23/2020 1454   TRIG 220 (H) 09/23/2020 1454   HDL 57 09/23/2020 1454   CHOLHDL 3.9 09/23/2020 1454   LDLCALC 129 (H) 09/23/2020 1454    Other Studies Reviewed Today:   Echocardiogram 07/24/2020  1. Left ventricular ejection fraction, by estimation, is 50 to 55%. The left ventricle has low normal function. The left ventricle has no regional wall motion abnormalities. There is mild left ventricular hypertrophy. Left ventricular diastolic parameters are indeterminate. 2. Right ventricular systolic function is normal. The right ventricular size is normal. Tricuspid regurgitation signal is inadequate for assessing PA  pressure. 3. The mitral valve is grossly normal. Trivial mitral valve regurgitation. 4. The aortic valve is tricuspid. Aortic valve regurgitation is mild. Aortic regurgitation PHT measures 670 msec. 5. Aortic dilatation noted. There is moderate dilatation of the ascending aorta, measuring 44 mm. 6. The inferior vena cava is normal in size with greater than 50% respiratory variability, suggesting right atrial pressure of 3 mmHg. Comparison(s): Previous Echo showed LV EF 55-60%, mild  AI.   NST 07/28/2020 Study Result  Narrative & Impression   No diagnostic ST segment changes indicate ischemia.  Small, mild intensity, fixed apical lateral defect that is most consistent with soft tissue attenuation. No large ischemic territories.  This is a low risk study.  Nuclear stress EF: 55%.     Assessment and Plan:    1. Chest pain, unspecified type Currently complains of intermittent chest pain/discomfort under her left breast not associated with exertion.  No radiation and no associated nausea, vomiting, or diaphoresis.  She had a recent negative/low risk stress test in November 21.  2. History of PSVT (paroxysmal supraventricular tachycardia) Currently denies any palpitations.  Discontinue Toprol-XL and start bisoprolol 5 mg daily.  Pulmonology thought this may help with some of her dyspnea-like symptoms.  3. Essential hypertension, benign Blood pressure today 118/78.  Continue HCTZ 25 mg daily.  Discontinue Toprol-XL.  Start bisoprolol 5 mg p.o. daily.  Continue HCTZ 25 mg daily.  4. CAD in native artery Had mild coronary atherosclerosis via previous work-up.  Low risk stress test 2019.  Stress test November 2021 was considered low risk and no evidence of ischemia.  Complains of intermittent left chest pain below her left breast not associated with exertion.  No radiation.  No associated nausea, vomiting or diaphoresis.  5. Mixed hyperlipidemia Patient had been taking Lipitor but was  experiencing side effects.  She stopped the medication.  Please change statin medication to Crestor 5 mg daily.  Get a fasting lipid profile and LFTs in 6 to 8 weeks.  6. SOB (shortness of breath)/dyspnea on exertion Continues to complain of dyspnea with and without exertion.  Recent echocardiogram in November 2021 Demonstrated EF 50 to 55%.  No WMA's.  Mild LVH, trivial MR, mild AR, moderate dilatation of ascending aorta of 44 mm.  Recently saw pulmonology.  His recommendation was to stop Toprol and start bisoprolol being the most beta 1 selective beta-blocker on a trial basis to make sure spillover beta-2 effects of the less specific beta-blockers were not contributing to the patient's symptoms.  We are changing Toprol to bisoprolol today.  Medication Adjustments/Labs and Tests Ordered: Current medicines are reviewed at length with the patient today.  Concerns regarding medicines are outlined above.   Disposition: Follow-up with Dr. Domenic Polite or APP 1 month Signed, Levell July, NP 10/31/2020 9:10 AM    [facility] [place], [place], [place] [address] Phone: [phone]; Fax: [phone] is questioning as to whether she should be on the medication or not.

## 2020-10-31 ENCOUNTER — Encounter: Payer: Self-pay | Admitting: Family Medicine

## 2020-10-31 ENCOUNTER — Ambulatory Visit (INDEPENDENT_AMBULATORY_CARE_PROVIDER_SITE_OTHER): Payer: Medicare Other | Admitting: Family Medicine

## 2020-10-31 ENCOUNTER — Other Ambulatory Visit: Payer: Self-pay

## 2020-10-31 VITALS — BP 118/78 | HR 90 | Ht 65.0 in | Wt 191.0 lb

## 2020-10-31 DIAGNOSIS — I2510 Atherosclerotic heart disease of native coronary artery without angina pectoris: Secondary | ICD-10-CM

## 2020-10-31 DIAGNOSIS — I10 Essential (primary) hypertension: Secondary | ICD-10-CM | POA: Diagnosis not present

## 2020-10-31 DIAGNOSIS — R0602 Shortness of breath: Secondary | ICD-10-CM | POA: Diagnosis not present

## 2020-10-31 DIAGNOSIS — R079 Chest pain, unspecified: Secondary | ICD-10-CM

## 2020-10-31 DIAGNOSIS — Z8679 Personal history of other diseases of the circulatory system: Secondary | ICD-10-CM | POA: Diagnosis not present

## 2020-10-31 DIAGNOSIS — E782 Mixed hyperlipidemia: Secondary | ICD-10-CM | POA: Diagnosis not present

## 2020-10-31 MED ORDER — ROSUVASTATIN CALCIUM 5 MG PO TABS: 5.0000 mg | ORAL_TABLET | Freq: Every day | ORAL | 6 refills | Status: DC

## 2020-10-31 MED ORDER — BISOPROLOL FUMARATE 5 MG PO TABS: 5.0000 mg | ORAL_TABLET | Freq: Every day | ORAL | 6 refills | Status: DC

## 2020-10-31 NOTE — Patient Instructions (Addendum)
Medication Instructions:   Stop Toprol (Metoprolol Succ).   Begin Bisoprolol 5mg  daily.   Begin Crestor 5mg  daily.    Continue all other medications.    Labwork:  Cholesterol labs due in 6-8 weeks.   Will mail reminder when time.   Testing/Procedures: none  Follow-Up: 1 month   Any Other Special Instructions Will Be Listed Below (If Applicable).  If you need a refill on your cardiac medications before your next appointment, please call your pharmacy.

## 2020-10-31 NOTE — Addendum Note (Signed)
Addended by: Laurine Blazer on: 10/31/2020 10:18 AM   Modules accepted: Orders

## 2020-11-04 ENCOUNTER — Telehealth: Payer: Self-pay | Admitting: Cardiology

## 2020-11-04 NOTE — Telephone Encounter (Signed)
Pt c/o medication issue:  1. Name of Medication: Toporol   2. How are you currently taking this medication (dosage and times per day)? Crestor 5mg  daily, she is taking whatever Dr. Domenic Polite prescribed on the Toporol.  3. Are you having a reaction (difficulty breathing--STAT)? STAT  4. What is your medication issue? She feels the change has caused her to have trouble breathing  She said she is going to start back taking Toprol (Metoprolol Succ) and Continue all other medications. She has been taking 1/2 Lipitor/Crestor. She wants to be seen by Dr. Domenic Polite to address this. Please call patient back with instructions. We could see her in [place] 12/11/20.

## 2020-11-05 NOTE — Telephone Encounter (Signed)
Pt says she tried to take Bisoprolol and it made her feel weak - wanted to make Dr Domenic Polite aware that she went back to taking previous dose of Toprol XL 50 mg daily and also tried Crestor but says this also made her feel weak and she is taking 1/2 tablet of Lipitor 20 mg daily

## 2020-11-05 NOTE — Telephone Encounter (Signed)
Patient returned call to [place].  °

## 2020-11-05 NOTE — Telephone Encounter (Signed)
Thank you for the update!

## 2020-11-06 ENCOUNTER — Telehealth: Payer: Self-pay | Admitting: Internal Medicine

## 2020-11-06 ENCOUNTER — Telehealth: Payer: Self-pay | Admitting: Cardiology

## 2020-11-06 NOTE — Telephone Encounter (Signed)
Fine with me

## 2020-11-06 NOTE — Telephone Encounter (Signed)
Spoke with the pt  She wants to switch to Dr Elsworth Soho or Dr Halford Chessman in [place]  Dr Melvyn Novas- please advise if this is okay with you, thanks!

## 2020-11-06 NOTE — Telephone Encounter (Signed)
Pt requesting Dr Domenic Polite send in prednisone for her breathing and SOB - denies chest pain/swelling - says her breathing is better since taking "breathing treatments" but thinks prednisone would improve further - pt aware that would need to come from pulmonology or pcp - pt given number to Dr Morrison Old office and pt wants to switch pulmonologists - pt voiced understanding

## 2020-11-06 NOTE — Telephone Encounter (Signed)
I am okay with switch. 

## 2020-11-06 NOTE — Telephone Encounter (Signed)
Called and spoke to pt. Appt scheduled with Dr. Halford Chessman in [place] for 12/09/20. Pt verbalized understanding and denied any further questions or concerns at this time.

## 2020-11-06 NOTE — Telephone Encounter (Signed)
RA and VS are you ok with pt changing providers?  Thanks

## 2020-11-06 NOTE — Telephone Encounter (Signed)
Patient called stating that she is having difficulty in breathing. States that she is being treated for COPD.  Been having issues for 2 weeks but breathing is getting worse this morning.

## 2020-11-07 DIAGNOSIS — Z20822 Contact with and (suspected) exposure to covid-19: Secondary | ICD-10-CM | POA: Diagnosis not present

## 2020-11-07 DIAGNOSIS — J441 Chronic obstructive pulmonary disease with (acute) exacerbation: Secondary | ICD-10-CM | POA: Diagnosis not present

## 2020-11-07 DIAGNOSIS — J069 Acute upper respiratory infection, unspecified: Secondary | ICD-10-CM | POA: Diagnosis not present

## 2020-11-11 ENCOUNTER — Other Ambulatory Visit: Payer: Self-pay | Admitting: Cardiology

## 2020-11-19 ENCOUNTER — Other Ambulatory Visit: Payer: Self-pay

## 2020-11-19 ENCOUNTER — Encounter (INDEPENDENT_AMBULATORY_CARE_PROVIDER_SITE_OTHER): Payer: Medicare Other | Admitting: Ophthalmology

## 2020-11-19 ENCOUNTER — Other Ambulatory Visit: Payer: Self-pay | Admitting: Oral Surgery

## 2020-11-19 DIAGNOSIS — I10 Essential (primary) hypertension: Secondary | ICD-10-CM

## 2020-11-19 DIAGNOSIS — H353131 Nonexudative age-related macular degeneration, bilateral, early dry stage: Secondary | ICD-10-CM | POA: Diagnosis not present

## 2020-11-19 DIAGNOSIS — H43813 Vitreous degeneration, bilateral: Secondary | ICD-10-CM | POA: Diagnosis not present

## 2020-11-19 DIAGNOSIS — B370 Candidal stomatitis: Secondary | ICD-10-CM | POA: Diagnosis not present

## 2020-11-19 DIAGNOSIS — K1379 Other lesions of oral mucosa: Secondary | ICD-10-CM | POA: Diagnosis not present

## 2020-11-19 DIAGNOSIS — H35033 Hypertensive retinopathy, bilateral: Secondary | ICD-10-CM | POA: Diagnosis not present

## 2020-11-19 DIAGNOSIS — D3132 Benign neoplasm of left choroid: Secondary | ICD-10-CM

## 2020-11-20 DIAGNOSIS — H81399 Other peripheral vertigo, unspecified ear: Secondary | ICD-10-CM | POA: Diagnosis not present

## 2020-11-20 DIAGNOSIS — K5900 Constipation, unspecified: Secondary | ICD-10-CM | POA: Diagnosis not present

## 2020-11-24 DIAGNOSIS — X58XXXA Exposure to other specified factors, initial encounter: Secondary | ICD-10-CM | POA: Diagnosis not present

## 2020-11-24 DIAGNOSIS — S22080A Wedge compression fracture of T11-T12 vertebra, initial encounter for closed fracture: Secondary | ICD-10-CM | POA: Diagnosis not present

## 2020-11-24 DIAGNOSIS — I10 Essential (primary) hypertension: Secondary | ICD-10-CM | POA: Diagnosis not present

## 2020-11-24 DIAGNOSIS — S22050A Wedge compression fracture of T5-T6 vertebra, initial encounter for closed fracture: Secondary | ICD-10-CM | POA: Diagnosis not present

## 2020-11-24 DIAGNOSIS — I700 Atherosclerosis of aorta: Secondary | ICD-10-CM | POA: Diagnosis not present

## 2020-11-24 DIAGNOSIS — M4804 Spinal stenosis, thoracic region: Secondary | ICD-10-CM | POA: Diagnosis not present

## 2020-11-24 DIAGNOSIS — I708 Atherosclerosis of other arteries: Secondary | ICD-10-CM | POA: Diagnosis not present

## 2020-11-24 DIAGNOSIS — M545 Low back pain, unspecified: Secondary | ICD-10-CM | POA: Diagnosis not present

## 2020-11-24 DIAGNOSIS — I2510 Atherosclerotic heart disease of native coronary artery without angina pectoris: Secondary | ICD-10-CM | POA: Diagnosis not present

## 2020-11-24 DIAGNOSIS — J439 Emphysema, unspecified: Secondary | ICD-10-CM | POA: Diagnosis not present

## 2020-11-26 DIAGNOSIS — S22029B Unspecified fracture of second thoracic vertebra, initial encounter for open fracture: Secondary | ICD-10-CM | POA: Diagnosis not present

## 2020-11-28 ENCOUNTER — Ambulatory Visit: Payer: Medicare Other | Admitting: Family Medicine

## 2020-11-28 NOTE — Progress Notes (Deleted)
Cardiology Office Note  Date: 11/28/2020   ID: Michelle Zavala, DOB 1945-11-17, MRN [id number]  PCP:  Neale Burly, MD  Cardiologist:  Rozann Lesches, MD Electrophysiologist:  None   Chief Complaint: Left breast pain, shortness of breath, discuss changing statin medication   History of Present Illness: Michelle Zavala is a 75 y.o. female with a history of PSVT, CAD, HTN, COPD.[name]  Last encounter with Dr. Domenic Polite 10/05/2019.  She did not report any anginal symptoms or palpitations.  Reported lack of energy and initiated.  Taking Zoloft for depression.  She had lost 2 of her children and prior year.  She was continuing Toprol-XL and omega-3 supplements.  EKG was reviewed showing sinus rhythm with borderline prolonged PR intervals and sinus arrhythmia.  She was continuing HCTZ with potassium supplements.  Mild CAD per previous work-up.  Myoview study May 2019 low risk.  She was last here for follow-up status post recent stress test for complaints of chest pain on and off since October.  Stress test was considered to be low risk.  Stated she was continuing to have some dyspnea on exertion.  She is under significant amount of stress due to loss of 2 children and her significant other.  Denied any chest pain.  No orthostatic symptoms, CVA or TIA-like symptoms, PND, orthopnea, bleeding issues, DVT or PE-like symptoms, lower extremity edema.  Denied any symptoms of sleep apnea i.e. snoring, apneic episodes or excessive daytime sleepiness.  She recently saw pulmonology who suggested changing Toprol to bisoprolol which might alleviate some of the shortness of breath symptoms she was having.  She also is having issues with taking Lipitor with side effects.  It was suggested she change to Crestor to see how well she would tolerate this medication.  She continues to complain of some chronic dyspnea.  She continues to be under a lot of stress.  She is having to move from her house to a senior community.  She has  had some deaths in her family recently.  She complains of some intermittent pain under her left breast without radiation or associated symptoms.  She had a negative stress November 2021.    Past Medical History:  Diagnosis Date  . Anxiety   . Arthritis   . C2 cervical fracture ([place]) 09/17/13   Traumatic fracture witth minimal displacement  . Chronic lung disease    Fibrosis - Dr. Koleen Nimrod  . COPD (chronic obstructive pulmonary disease) ([place])   . Coronary atherosclerosis of native coronary artery    Mild atherosclerosis 3/10, LVEF 60-65%  . Depression   . Diverticulosis   . Essential hypertension   . GERD (gastroesophageal reflux disease)   . PSVT (paroxysmal supraventricular tachycardia) ([place])     Past Surgical History:  Procedure Laterality Date  . ABDOMINAL HYSTERECTOMY    . ANTERIOR RELEASE VERTEBRAL BODY W/ POSTERIOR FUSION    . BACTERIAL OVERGROWTH TEST N/A 02/19/2016   Procedure: BACTERIAL OVERGROWTH TEST;  Surgeon: Daneil Dolin, MD;  [facility];  Service: Endoscopy;  Laterality: N/A;  0700  . BIOPSY N/A 08/04/2015   Procedure: BIOPSY;  Surgeon: Daneil Dolin, MD;  Location: [facility];  Service: Endoscopy;  Laterality: N/A;  Gastric  . BIOPSY  02/21/2017   Procedure: BIOPSY;  Surgeon: Daneil Dolin, MD;  [facility];  Service: Endoscopy;;  ascending colon biopsy   . BIOPSY  08/21/2020   Procedure: BIOPSY;  Surgeon: Daneil Dolin, MD;  [facility];  Service: Endoscopy;;  . cataract surgery    . CESAREAN SECTION    . CHOLECYSTECTOMY  1973  . COLONOSCOPY  June 2016   Dr. Britta Mccreedy: moderate diverticulosis in sigmoid colon, surveillance in 5 years   . COLONOSCOPY WITH PROPOFOL N/A 02/21/2017   Dr. Gala Romney: diverticulosis in sigmoid colon, tubular adenomas, segmental biopsies benign. Surveillance in 5 years   . ESOPHAGOGASTRODUODENOSCOPY (EGD) WITH PROPOFOL N/A 08/04/2015   Dr. Rourk:abnormal gastric mucosa s/p biopsy. Reactive gastropathy.  Negative H.pylori  . ESOPHAGOGASTRODUODENOSCOPY (EGD) WITH PROPOFOL N/A 02/21/2017   Dr. Gala Romney: empiric esophageal dilatation, small hiatal hernia, otherwise normal  . ESOPHAGOGASTRODUODENOSCOPY (EGD) WITH PROPOFOL N/A 08/21/2020   normal esophagus s/p dilation. Erythematous mucosa in stomach of doubtful clinical significant s/p biopsy. Negative H.pylori. Mild reactive gastropathy.   [name] HERNIA REPAIR N/A 12/03/2015   Procedure: [name] HERNIORRHAPHY WITH MESH;  Surgeon: Aviva Signs, MD;  Location: [facility];  Service: General;  Laterality: N/A;  . INSERTION OF MESH  12/03/2015   Procedure: INSERTION OF MESH;  Surgeon: Aviva Signs, MD;  Location: [facility];  Service: General;;  . IR RADIOLOGIST EVAL & MGMT  09/22/2017  . MALONEY DILATION N/A 02/21/2017   Procedure: [name] DILATION;  Surgeon: Daneil Dolin, MD;  [facility];  Service: Endoscopy;  Laterality: N/A;  . [name] DILATION N/A 08/21/2020   Procedure: [name] DILATION;  Surgeon: Daneil Dolin, MD;  [facility];  Service: Endoscopy;  Laterality: N/A;  . POLYPECTOMY  02/21/2017   Procedure: POLYPECTOMY;  Surgeon: Daneil Dolin, MD;  [facility];  Service: Endoscopy;;  hepatic flexure polyp cs  . TMJ ARTHROSCOPY Bilateral 02/04/2015   Procedure: BILATERAL TEMPOROMANDIBULAR JOINT (TMJ) ARTHROSCOPY MENISECTOMY WITH FAT GRAFT FROM ABDOMEN ;  Surgeon: Diona Browner, DDS;  Location: [facility];  Service: Oral Surgery;  Laterality: Bilateral;  . TOTAL KNEE ARTHROPLASTY Bilateral   . TUBAL LIGATION      Current Outpatient Medications  Medication Sig Dispense Refill  . acetaminophen (TYLENOL) 650 MG CR tablet Take 1,300 mg by mouth every 8 (eight) hours as needed for pain.     [name] albuterol (VENTOLIN HFA) 108 (90 Base) MCG/ACT inhaler Inhale 2 puffs into the lungs every 4 (four) hours as needed for wheezing or shortness of breath.    [name] amitriptyline (ELAVIL) 75 MG tablet Take 75 mg by mouth at bedtime.  6  .  Ascorbic Acid (VITAMIN C) 1000 MG tablet Take 1,000 mg by mouth daily.    [name] atorvastatin (LIPITOR) 20 MG tablet Take 10 mg by mouth daily.    . cholecalciferol (VITAMIN D3) 25 MCG (1000 UNIT) tablet Take 1,000 Units by mouth daily.    [name] Calcium (STOOL SOFTENER PO) Take by mouth daily at 12 noon. Takes 2 daily    . hydrochlorothiazide (HYDRODIURIL) 25 MG tablet Take 25 mg by mouth every morning.    [name] ipratropium (ATROVENT) 0.02 % nebulizer solution Take 0.5 mg by nebulization every 4 (four) hours as needed for wheezing or shortness of breath.    [name] LORazepam (ATIVAN) 1 MG tablet Take 1 mg by mouth 2 (two) times daily.    . metoprolol succinate (TOPROL-XL) 50 MG 24 hr tablet Take 1 tablet (50 mg total) by mouth daily. 90 tablet 1  . Multiple Vitamins-Minerals (PRESERVISION AREDS PO) Take 1 tablet by mouth in the morning and at bedtime.     . Omega-3 Fatty Acids (FISH OIL) 1000 MG CAPS Take  3,000 mg by mouth daily.     . pantoprazole (PROTONIX) 40 MG tablet Take 1 tablet (40 mg total) by mouth 2 (two) times daily. 60 tablet 1  . potassium chloride SA (KLOR-CON) 20 MEQ tablet Take 20 mEq by mouth daily.    . sertraline (ZOLOFT) 100 MG tablet Take 100 mg by mouth daily.    [name] umeclidinium-vilanterol (ANORO ELLIPTA) 62.5-25 MCG/INH AEPB Inhale 1 puff into the lungs daily as needed (asthma).     . vitamin B-12 (CYANOCOBALAMIN) 1000 MCG tablet Take 1,000 mcg by mouth daily.    . Wheat Dextrin (BENEFIBER) POWD Take by mouth in the morning and at bedtime. Takes 3 Tbsp twice daily     No current facility-administered medications for this visit.   Allergies:  Sulfonamide derivatives   Social History: The patient  reports that she has never smoked. She has never used smokeless tobacco. She reports that she does not drink alcohol and does not use drugs.   Family History: The patient's family history includes Colon cancer in her son; Coronary artery disease in her brother, father, and sister; Heart  disease in her father.   ROS:  Please see the history of present illness. Otherwise, complete review of systems is positive for none.  All other systems are reviewed and negative.   Physical Exam: VS:  There were no vitals taken for this visit., BMI There is no height or weight on file to calculate BMI.  Wt Readings from Last 3 Encounters:  10/31/20 191 lb (86.6 kg)  10/22/20 196 lb 12.8 oz (89.3 kg)  09/23/20 202 lb (91.6 kg)    General: Obese patient appears comfortable at rest. Neck: Supple, no elevated JVP or carotid bruits, no thyromegaly. Lungs: Clear to auscultation, nonlabored breathing at rest. Cardiac: Regular rate and rhythm, no S3 or significant systolic murmur, no pericardial rub. Extremities: No pitting edema, distal pulses 2+. Skin: Warm and dry. Musculoskeletal: No kyphosis. Neuropsychiatric: Alert and oriented x3, affect grossly appropriate.  ECG:  An ECG dated 10/31/2020 was personally reviewed today and demonstrated:  Sinus rhythm with premature atrial complexes, left anterior fascicular block, nonspecific ST and T wave abnormality, rate of 92.  Recent Labwork: 07/30/2020: ALT 10; AST 16; BUN 16; Creat 1.10; Hemoglobin 13.6; Platelets 276; Potassium 3.5; Sodium 139     Component Value Date/Time   CHOL 223 (H) 09/23/2020 1454   TRIG 220 (H) 09/23/2020 1454   HDL 57 09/23/2020 1454   CHOLHDL 3.9 09/23/2020 1454   LDLCALC 129 (H) 09/23/2020 1454    Other Studies Reviewed Today:   Echocardiogram 07/24/2020  1. Left ventricular ejection fraction, by estimation, is 50 to 55%. The left ventricle has low normal function. The left ventricle has no regional wall motion abnormalities. There is mild left ventricular hypertrophy. Left ventricular diastolic parameters are indeterminate. 2. Right ventricular systolic function is normal. The right ventricular size is normal. Tricuspid regurgitation signal is inadequate for assessing PA pressure. 3. The mitral valve is  grossly normal. Trivial mitral valve regurgitation. 4. The aortic valve is tricuspid. Aortic valve regurgitation is mild. Aortic regurgitation PHT measures 670 msec. 5. Aortic dilatation noted. There is moderate dilatation of the ascending aorta, measuring 44 mm. 6. The inferior vena cava is normal in size with greater than 50% respiratory variability, suggesting right atrial pressure of 3 mmHg. Comparison(s): Previous Echo showed LV EF 55-60%, mild AI.   NST 07/28/2020 Study Result  Narrative & Impression   No diagnostic ST segment changes  indicate ischemia.  Small, mild intensity, fixed apical lateral defect that is most consistent with soft tissue attenuation. No large ischemic territories.  This is a low risk study.  Nuclear stress EF: 55%.     Assessment and Plan:    1. Chest pain, unspecified type Currently complains of intermittent chest pain/discomfort under her left breast not associated with exertion.  No radiation and no associated nausea, vomiting, or diaphoresis.  She had a recent negative/low risk stress test in November 21.  2. History of PSVT (paroxysmal supraventricular tachycardia) Currently denies any palpitations.  Discontinue Toprol-XL and start bisoprolol 5 mg daily.  Pulmonology thought this may help with some of her dyspnea-like symptoms.  3. Essential hypertension, benign Blood pressure today 118/78.  Continue HCTZ 25 mg daily.  Discontinue Toprol-XL.  Start bisoprolol 5 mg p.o. daily.  Continue HCTZ 25 mg daily.  4. CAD in native artery Had mild coronary atherosclerosis via previous work-up.  Low risk stress test 2019.  Stress test November 2021 was considered low risk and no evidence of ischemia.  Complains of intermittent left chest pain below her left breast not associated with exertion.  No radiation.  No associated nausea, vomiting or diaphoresis.  5. Mixed hyperlipidemia Patient had been taking Lipitor but was experiencing side effects.  She  stopped the medication.  Please change statin medication to Crestor 5 mg daily.  Get a fasting lipid profile and LFTs in 6 to 8 weeks.  6. SOB (shortness of breath)/dyspnea on exertion Continues to complain of dyspnea with and without exertion.  Recent echocardiogram in November 2021 Demonstrated EF 50 to 55%.  No WMA's.  Mild LVH, trivial MR, mild AR, moderate dilatation of ascending aorta of 44 mm.  Recently saw pulmonology.  His recommendation was to stop Toprol and start bisoprolol being the most beta 1 selective beta-blocker on a trial basis to make sure spillover beta-2 effects of the less specific beta-blockers were not contributing to the patient's symptoms.  We are changing Toprol to bisoprolol today.  Medication Adjustments/Labs and Tests Ordered: Current medicines are reviewed at length with the patient today.  Concerns regarding medicines are outlined above.   Disposition: Follow-up with Dr. Domenic Polite or APP 1 month Signed, Levell July, NP 11/28/2020 12:06 AM    [facility] [place], [place], [place] [address] Phone: [phone]; Fax: [phone] is questioning as to whether she should be on the medication or not.

## 2020-12-01 DIAGNOSIS — M7061 Trochanteric bursitis, right hip: Secondary | ICD-10-CM | POA: Diagnosis not present

## 2020-12-01 DIAGNOSIS — M7062 Trochanteric bursitis, left hip: Secondary | ICD-10-CM | POA: Diagnosis not present

## 2020-12-04 ENCOUNTER — Ambulatory Visit (INDEPENDENT_AMBULATORY_CARE_PROVIDER_SITE_OTHER): Payer: Medicare Other | Admitting: Gastroenterology

## 2020-12-04 ENCOUNTER — Encounter: Payer: Self-pay | Admitting: Gastroenterology

## 2020-12-04 ENCOUNTER — Other Ambulatory Visit: Payer: Self-pay

## 2020-12-04 VITALS — BP 126/81 | HR 78 | Temp 97.6°F | Ht 65.0 in | Wt 190.0 lb

## 2020-12-04 DIAGNOSIS — K5900 Constipation, unspecified: Secondary | ICD-10-CM | POA: Diagnosis not present

## 2020-12-04 DIAGNOSIS — R109 Unspecified abdominal pain: Secondary | ICD-10-CM | POA: Insufficient documentation

## 2020-12-04 NOTE — Progress Notes (Signed)
Referring Provider: Neale Burly, MD Primary Care Physician:  Neale Burly, MD Primary GI: Dr. Gala Romney   Chief Complaint  Patient presents with  . Abdominal Pain    RUQ x 1 month, constant and stays in place  . Constipation    Bowels are better now  . Nausea    Gagging no vomiting    HPI:   Michelle Zavala is a 75 y.o. female presenting today with a history of chronic abdominal pain (multiple sites),diarrhea, dysphagia.Prior extensive evaluation listed below. Most recently EGD in Dec 2021 with  normal esophagus s/p dilation. Erythematous mucosa in stomach of doubtful clinical significant s/p biopsy. Negative H.pylori. Mild reactive gastropathy. Here for follow-up for constipation.   Provided a Miralax purge at last visit and Linzess 145 mcg to start daily. PPI BID for GERD.   Linzess 145 every other day and doesn't want to change. States she has to go quick when it hits her. Wants to keep this dose.  Having RUQ discomfort that aches all the time and when standing has right flank pain. Pain about 2 months ago. Not as bad with sitting down. Hard to get up at times. No urinary symptoms like burning or frequency. When lifting up legs, has right sided pain. Recent CT thoracic spine with subtle T6 superior endplate compression fracture new since 2015 but age indeterminate, stable thoracic compression fractures, severe at T12 and associated with chronic spinal stenosis, no age advanced thoracic spine degeneration, no CT evidence of spinal stenosis above T12.   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 Progression of SMA disease could cause issues, but clinical monitoring recommended per IR. She was seen by Vascular Surgery due to renal artery disease as well. Medical therapy recommended. Symptomatic fat-containing ventral hernia noted at visit in March 2017, undergoing hernia repair in March 2017.TCS/EGD with negative  segmental colonic biopsies, needing surveillance in 5 years due to colonic adenomas. EGD with empiric dilatation, small hiatal hernia. Celiac serologies negative. 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 Negative hydrogen breath test for small bowel bacterial overgrowth.CTA updated 08/24/17: mixed plaque at origin of SMA but not felt to be greater than 50%. Celiac and IMA remained patent. Discussed with Dr. Earleen Newport, who recommended IR outpatient clinic evaluation. She was not felt to have chronic mesenteric ischemia.CT abd/pelvis with contrast Jan 2021 no acute findings. Most recent EGD Dec 2021.   Past Medical History:  Diagnosis Date  . Anxiety   . Arthritis   . C2 cervical fracture ([place]) 09/17/13   Traumatic fracture witth minimal displacement  . Chronic lung disease    Fibrosis - Dr. Koleen Nimrod  . COPD (chronic obstructive pulmonary disease) ([place])   . Coronary atherosclerosis of native coronary artery    Mild atherosclerosis 3/10, LVEF 60-65%  . Depression   . Diverticulosis   . Essential hypertension   . GERD (gastroesophageal reflux disease)   . PSVT (paroxysmal supraventricular tachycardia) ([place])     Past Surgical History:  Procedure Laterality Date  . ABDOMINAL HYSTERECTOMY    . ANTERIOR RELEASE VERTEBRAL BODY W/ POSTERIOR FUSION    . BACTERIAL OVERGROWTH TEST N/A 02/19/2016   Procedure: BACTERIAL OVERGROWTH TEST;  Surgeon: Daneil Dolin, MD;  [facility];  Service: Endoscopy;  Laterality: N/A;  0700  . BIOPSY N/A 08/04/2015   Procedure: BIOPSY;  Surgeon: Daneil Dolin, MD;  Location:  [facility];  Service: Endoscopy;  Laterality: N/A;  Gastric  . BIOPSY  02/21/2017   Procedure: BIOPSY;  Surgeon: Daneil Dolin, MD;  [facility];  Service: Endoscopy;;  ascending colon biopsy   . BIOPSY  08/21/2020   Procedure: BIOPSY;  Surgeon: Daneil Dolin, MD;  [facility];  Service: Endoscopy;;  . cataract surgery    . CESAREAN SECTION     . CHOLECYSTECTOMY  1973  . COLONOSCOPY  June 2016   Dr. Britta Mccreedy: moderate diverticulosis in sigmoid colon, surveillance in 5 years   . COLONOSCOPY WITH PROPOFOL N/A 02/21/2017   Dr. Gala Romney: diverticulosis in sigmoid colon, tubular adenomas, segmental biopsies benign. Surveillance in 5 years   . ESOPHAGOGASTRODUODENOSCOPY (EGD) WITH PROPOFOL N/A 08/04/2015   Dr. Rourk:abnormal gastric mucosa s/p biopsy. Reactive gastropathy. Negative H.pylori  . ESOPHAGOGASTRODUODENOSCOPY (EGD) WITH PROPOFOL N/A 02/21/2017   Dr. Gala Romney: empiric esophageal dilatation, small hiatal hernia, otherwise normal  . ESOPHAGOGASTRODUODENOSCOPY (EGD) WITH PROPOFOL N/A 08/21/2020   normal esophagus s/p dilation. Erythematous mucosa in stomach of doubtful clinical significant s/p biopsy. Negative H.pylori. Mild reactive gastropathy.   [name] HERNIA REPAIR N/A 12/03/2015   Procedure: [name] HERNIORRHAPHY WITH MESH;  Surgeon: Aviva Signs, MD;  Location: [facility];  Service: General;  Laterality: N/A;  . INSERTION OF MESH  12/03/2015   Procedure: INSERTION OF MESH;  Surgeon: Aviva Signs, MD;  Location: [facility];  Service: General;;  . IR RADIOLOGIST EVAL & MGMT  09/22/2017  . MALONEY DILATION N/A 02/21/2017   Procedure: [name] DILATION;  Surgeon: Daneil Dolin, MD;  [facility];  Service: Endoscopy;  Laterality: N/A;  . [name] DILATION N/A 08/21/2020   Procedure: [name] DILATION;  Surgeon: Daneil Dolin, MD;  [facility];  Service: Endoscopy;  Laterality: N/A;  . POLYPECTOMY  02/21/2017   Procedure: POLYPECTOMY;  Surgeon: Daneil Dolin, MD;  [facility];  Service: Endoscopy;;  hepatic flexure polyp cs  . TMJ ARTHROSCOPY Bilateral 02/04/2015   Procedure: BILATERAL TEMPOROMANDIBULAR JOINT (TMJ) ARTHROSCOPY MENISECTOMY WITH FAT GRAFT FROM ABDOMEN ;  Surgeon: Diona Browner, DDS;  Location: [facility];  Service: Oral Surgery;  Laterality: Bilateral;  . TOTAL KNEE ARTHROPLASTY Bilateral   . TUBAL  LIGATION      Current Outpatient Medications  Medication Sig Dispense Refill  . acetaminophen (TYLENOL) 650 MG CR tablet Take 1,300 mg by mouth every 8 (eight) hours as needed for pain.     [name] albuterol (VENTOLIN HFA) 108 (90 Base) MCG/ACT inhaler Inhale 2 puffs into the lungs every 4 (four) hours as needed for wheezing or shortness of breath.    [name] amitriptyline (ELAVIL) 75 MG tablet Take 75 mg by mouth at bedtime.  6  . Ascorbic Acid (VITAMIN C) 1000 MG tablet Take 1,000 mg by mouth daily.    [name] atorvastatin (LIPITOR) 20 MG tablet Take 10 mg by mouth daily.    . cholecalciferol (VITAMIN D3) 25 MCG (1000 UNIT) tablet Take 1,000 Units by mouth daily.    [name] Calcium (STOOL SOFTENER PO) Take by mouth daily at 12 noon. Takes 2 daily every other day    . hydrochlorothiazide (HYDRODIURIL) 25 MG tablet Take 25 mg by mouth every morning.    [name] ipratropium (ATROVENT) 0.02 % nebulizer solution Take 0.5 mg by nebulization every 4 (four) hours as needed for wheezing or shortness of breath.    [name] LORazepam (ATIVAN) 1 MG tablet Take 1 mg by mouth  2 (two) times daily.    . metoprolol succinate (TOPROL-XL) 50 MG 24 hr tablet Take 1 tablet (50 mg total) by mouth daily. 90 tablet 1  . Multiple Vitamins-Minerals (PRESERVISION AREDS PO) Take 1 tablet by mouth in the morning and at bedtime.     . Omega-3 Fatty Acids (FISH OIL) 1000 MG CAPS Take 3,000 mg by mouth daily.     . pantoprazole (PROTONIX) 40 MG tablet Take 1 tablet (40 mg total) by mouth 2 (two) times daily. 60 tablet 1  . potassium chloride SA (KLOR-CON) 20 MEQ tablet Take 20 mEq by mouth daily.    . sertraline (ZOLOFT) 100 MG tablet Take 100 mg by mouth daily.    [name] umeclidinium-vilanterol (ANORO ELLIPTA) 62.5-25 MCG/INH AEPB Inhale 1 puff into the lungs daily as needed (asthma).     . vitamin B-12 (CYANOCOBALAMIN) 1000 MCG tablet Take 1,000 mcg by mouth daily.    . Wheat Dextrin (BENEFIBER) POWD Take by mouth in the morning and at bedtime. As needed      No current facility-administered medications for this visit.    Allergies as of 12/04/2020 - Review Complete 12/04/2020  Allergen Reaction Noted  . Sulfonamide derivatives Hives     Family History  Problem Relation Age of Onset  . Coronary artery disease Father        Premature  . Heart disease Father   . Coronary artery disease Brother        Premature  . Coronary artery disease Sister        Premature  . Colon cancer Son     Social History   Socioeconomic History  . Marital status: Widowed    Spouse name: Not on file  . Number of children: 3  . Years of education: 7  . Highest education level: Not on file  Occupational History  . Occupation: Retired  Tobacco Use  . Smoking status: Never Smoker  . Smokeless tobacco: Never Used  Vaping Use  . Vaping Use: Never used  Substance and Sexual Activity  . Alcohol use: No    Alcohol/week: 0.0 standard drinks  . Drug use: No  . Sexual activity: Never    Birth control/protection: None, Surgical  Other Topics Concern  . Not on file  Social History Narrative   Occasionally drinks [employer]    Social Determinants of Health   Financial Resource Strain: Not on file  Food Insecurity: Not on file  Transportation Needs: Not on file  Physical Activity: Not on file  Stress: Not on file  Social Connections: Not on file    Review of Systems: Gen: Denies fever, chills, anorexia. Denies fatigue, weakness, weight loss.  CV: Denies chest pain, palpitations, syncope, peripheral edema, and claudication. Resp: Denies dyspnea at rest, cough, wheezing, coughing up blood, and pleurisy. GI: see HPI Derm: Denies rash, itching, dry skin Psych: Denies depression, anxiety, memory loss, confusion. No homicidal or suicidal ideation.  Heme: Denies bruising, bleeding, and enlarged lymph nodes.  Physical Exam: BP 126/81   Pulse 78   Temp 97.6 F (36.4 C)   Ht 5\' 5"  (1.651 m)   Wt 190 lb (86.2 kg)   BMI 31.62 kg/m  General:   Alert  and oriented. No distress noted. Pleasant and cooperative.  Head:  Normocephalic and atraumatic. Eyes:  Conjuctiva clear without scleral icterus. Mouth:  Mask in place Abdomen:  +BS, soft, mild TTP RUQ and non-distended. No rebound or guarding. No HSM or masses noted. Msk:  Symmetrical  without gross deformities. Normal posture. Extremities:  Without edema. Neurologic:  Alert and  oriented x4 Psych:  Alert and cooperative. Normal mood and affect.  Lab Results  Component Value Date   ALT 10 07/30/2020   AST 16 07/30/2020   ALKPHOS 50 08/30/2016   BILITOT 0.6 07/30/2020   Lab Results  Component Value Date   WBC 10.4 07/30/2020   HGB 13.6 07/30/2020   HCT 39.1 07/30/2020   MCV 90.7 07/30/2020   PLT 276 07/30/2020   Lab Results  Component Value Date   CREATININE 1.10 (H) 07/30/2020   BUN 16 07/30/2020   NA 139 07/30/2020   K 3.5 07/30/2020   CL 100 07/30/2020   CO2 25 07/30/2020     ASSESSMENT: Michelle Zavala is a 75 y.o. female presenting today with history of chronic abdominal pain, IBS with predominant constipation currently, dysphagia, and reporting what is consistent with musculoskeletal pain.   Doing well with Linzess 145 mcg but actually taking this every other day due to urgency when taking; I offered a lower dosage, but she would like to keep this at the novel dosing of every other day.   GERD: controlled with PPI BID  Right flank/right-sided abdominal pain: precipitated by movement. Recent CT thoracic spine with subtle T6 superior endplate compression fracture new since 2015 but age indeterminate, stable thoracic compression fractures, severe at T12 and associated with chronic spinal stenosis, no age advanced thoracic spine degeneration, no CT evidence of spinal stenosis above T12. I have asked her to reach out to her PCP. Does not appear consistent with GI process.   Purposeful weight loss noted with change in diet. Overall, she is doing fairly well from a GI  standpoint with chronic issues.    PLAN:  Continue Linzess 145 mcg daily Patient advised to contact PCP regarding musculoskeletal pain PPI BID 3-4 month follow-up   Annitta Needs, PhD, ANP-BC [facility]

## 2020-12-04 NOTE — Patient Instructions (Signed)
Let's continue Linzess daily to every other day as you are doing.  I recommend getting back in touch with your PCP regarding musculoskeletal pain. I will try to get in touch as well.   We will see you in 3-4 months!  I enjoyed seeing you again today! As you know, I value our relationship and want to provide genuine, compassionate, and quality care. I welcome your feedback. If you receive a survey regarding your visit,  I greatly appreciate you taking time to fill this out. See you next time!  Annitta Needs, PhD, ANP-BC [facility]

## 2020-12-09 ENCOUNTER — Encounter: Payer: Self-pay | Admitting: Pulmonary Disease

## 2020-12-09 ENCOUNTER — Ambulatory Visit (INDEPENDENT_AMBULATORY_CARE_PROVIDER_SITE_OTHER): Payer: Medicare Other | Admitting: Pulmonary Disease

## 2020-12-09 ENCOUNTER — Other Ambulatory Visit: Payer: Self-pay

## 2020-12-09 VITALS — BP 124/72 | HR 84 | Temp 97.4°F | Ht 63.0 in | Wt 191.0 lb

## 2020-12-09 DIAGNOSIS — R0600 Dyspnea, unspecified: Secondary | ICD-10-CM

## 2020-12-09 DIAGNOSIS — R0609 Other forms of dyspnea: Secondary | ICD-10-CM

## 2020-12-09 DIAGNOSIS — J449 Chronic obstructive pulmonary disease, unspecified: Secondary | ICD-10-CM

## 2020-12-09 MED ORDER — BREZTRI AEROSPHERE 160-9-4.8 MCG/ACT IN AERO: 2.0000 | INHALATION_SPRAY | Freq: Two times a day (BID) | RESPIRATORY_TRACT | 0 refills | Status: DC

## 2020-12-09 MED ORDER — BREZTRI AEROSPHERE 160-9-4.8 MCG/ACT IN AERO: 2.0000 | INHALATION_SPRAY | Freq: Two times a day (BID) | RESPIRATORY_TRACT | 5 refills | Status: DC

## 2020-12-09 NOTE — Patient Instructions (Signed)
Use Breztri two puffs in the morning and two puffs in the evening, and rinse your mouth after each use  Albuterol two puffs every 4 to 6 hours as needed for cough, wheeze, chest congestion or shortness of breath  Will arrange for a chest xray  Follow up in 8 weeks

## 2020-12-09 NOTE — Addendum Note (Signed)
Addended by: Merrilee Seashore on: 12/09/2020 12:45 PM   Modules accepted: Orders

## 2020-12-09 NOTE — Progress Notes (Signed)
CC'ED TO PCP 

## 2020-12-09 NOTE — Progress Notes (Signed)
[facility]  Chief Complaint  Patient presents with  . Follow-up    Chest congestion with productive cough with "brownish looking phlegm"  for 6 months     Constitutional:  BP 124/72 (BP Location: Left Arm, Cuff Size: Normal)   Pulse 84   Temp (!) 97.4 F (36.3 C) (Other (Comment)) Comment (Src): wrist  Ht 5\' 3"  (1.6 m)   Wt 191 lb (86.6 kg)   SpO2 100% Comment: Room air  BMI 33.83 kg/m   Past Medical History:  Anxiety, OA, C 2 fracture January 2015, CAD, Depression, Diverticulosis, HTN, GERD, PSVT  Past Surgical History:  She  has a past surgical history that includes Cholecystectomy (1973); Cesarean section; Total knee arthroplasty (Bilateral); Anterior release vertebral body w/ posterior fusion; Abdominal hysterectomy; Tubal ligation; TMJ Arthroscopy (Bilateral, 02/04/2015); Esophagogastroduodenoscopy (egd) with propofol (N/A, 08/04/2015); biopsy (N/A, 08/04/2015); Incisional hernia repair (N/A, 12/03/2015); Insertion of mesh (12/03/2015); Colonoscopy (June 2016); Bacterial overgrowth test (N/A, 02/19/2016); Colonoscopy with propofol (N/A, 02/21/2017); Esophagogastroduodenoscopy (egd) with propofol (N/A, 02/21/2017); maloney dilation (N/A, 02/21/2017); biopsy (02/21/2017); polypectomy (02/21/2017); IR Radiologist Eval & Mgmt (09/22/2017); cataract surgery; Esophagogastroduodenoscopy (egd) with propofol (N/A, 08/21/2020); maloney dilation (N/A, 08/21/2020); and biopsy (08/21/2020).  Brief Summary:  Michelle Zavala is a 75 y.o. female with dyspnea.      Subjective:   She was previously seen by Dr. Melvyn Novas.  She has noticed trouble with her breathing for about 2 years.  Isn't sure what triggered this.  Never smoked, but lived with smokers.  No history of pneumonia or TB.  Gets winded after walking about 200 feet.  No issues with sleep.  Has cough and wheeze from her chest.  Brings up clear sputum.  Not having reflux or post nasal drip.  No skin rash or leg  swelling.  Uses breztri and albuterol prn.  These help, but she didn't know the difference between these two.  Tried anoro before, but didn't help.  Physical Exam:   Appearance - well kempt   ENMT - no sinus tenderness, no oral exudate, no LAN, Mallampati 3 airway, no stridor  Respiratory - equal breath sounds bilaterally, no wheezing or rales  CV - s1s2 regular rate and rhythm, no murmurs  Ext - no clubbing, no edema  Skin - no rashes  Psych - normal mood and affect   Pulmonary testing:   PFT 01/13/17 >> FEV1 1.70 (79%), FEV1% 83, TLC 3.76 (76%), DLCO 64%, no BD  Spirometry 05/05/17 >> FEV1 1.72 (81%), FEV1% 86  RAST 05/05/17 >> negative, IgE less than 2  Chest Imaging:    Cardiac Tests:   Echo 07/24/20 >> EF 50 to 55%, mild LVH, mil AR, aortic root 44 mm  Social History:  She  reports that she has never smoked. She has never used smokeless tobacco. She reports that she does not drink alcohol and does not use drugs.  Family History:  Her family history includes Colon cancer in her son; Coronary artery disease in her brother, father, and sister; Heart disease in her father.     Assessment/Plan:   Dyspnea with concern for COPD with asthma. - anoro was not effective - reviewed difference between breztri and albuterol - she will use breztri two puffs bid; sample given - albuterol prn - will arrange for chest xray - if symptoms persist, then might need CT chest and cardiopulmonary stress test to further assess  PSVT, HTN, CAD, Aortic root dilation. - followed by Dr. Inocente Salles  [place] with [facility]  Time Spent Involved in Patient Care on Day of Examination:  32 minutes  Follow up:  Patient Instructions  Use Breztri two puffs in the morning and two puffs in the evening, and rinse your mouth after each use  Albuterol two puffs every 4 to 6 hours as needed for cough, wheeze, chest congestion or shortness of breath  Will arrange for a chest xray  Follow up in 8  weeks   Medication List:   Allergies as of 12/09/2020      Reactions   Sulfonamide Derivatives Hives      Medication List       Accurate as of December 09, 2020 11:59 AM. If you have any questions, ask your nurse or doctor.        STOP taking these medications   Anoro Ellipta 62.5-25 MCG/INH Aepb Generic drug: umeclidinium-vilanterol Stopped by: Chesley Mires, MD     TAKE these medications   acetaminophen 650 MG CR tablet Commonly known as: TYLENOL Take 1,300 mg by mouth every 8 (eight) hours as needed for pain.   albuterol 108 (90 Base) MCG/ACT inhaler Commonly known as: VENTOLIN HFA Inhale 2 puffs into the lungs every 4 (four) hours as needed for wheezing or shortness of breath.   amitriptyline 75 MG tablet Commonly known as: ELAVIL Take 75 mg by mouth at bedtime.   atorvastatin 20 MG tablet Commonly known as: LIPITOR Take 10 mg by mouth daily.   Benefiber Powd Take by mouth in the morning and at bedtime. As needed   SunGard 160-9-4.8 MCG/ACT Aero Generic drug: Budeson-Glycopyrrol-Formoterol Inhale 2 puffs into the lungs in the morning and at bedtime. Started by: Chesley Mires, MD   cholecalciferol 25 MCG (1000 UNIT) tablet Commonly known as: VITAMIN D3 Take 1,000 Units by mouth daily.   Fish Oil 1000 MG Caps Take 3,000 mg by mouth daily.   hydrochlorothiazide 25 MG tablet Commonly known as: HYDRODIURIL Take 25 mg by mouth every morning.   ipratropium 0.02 % nebulizer solution Commonly known as: ATROVENT Take 0.5 mg by nebulization every 4 (four) hours as needed for wheezing or shortness of breath.   LORazepam 1 MG tablet Commonly known as: ATIVAN Take 1 mg by mouth 2 (two) times daily.   metoprolol succinate 50 MG 24 hr tablet Commonly known as: TOPROL-XL Take 1 tablet (50 mg total) by mouth daily.   pantoprazole 40 MG tablet Commonly known as: PROTONIX Take 1 tablet (40 mg total) by mouth 2 (two) times daily.   potassium chloride SA 20  MEQ tablet Commonly known as: KLOR-CON Take 20 mEq by mouth daily.   PRESERVISION AREDS PO Take 1 tablet by mouth in the morning and at bedtime.   sertraline 100 MG tablet Commonly known as: ZOLOFT Take 100 mg by mouth daily.   STOOL SOFTENER PO Take by mouth daily at 12 noon. Takes 2 daily every other day   vitamin B-12 1000 MCG tablet Commonly known as: CYANOCOBALAMIN Take 1,000 mcg by mouth daily.   vitamin C 1000 MG tablet Take 1,000 mg by mouth daily.       Signature:  Chesley Mires, MD [place] Pager - [phone] 12/09/2020, 11:59 AM

## 2020-12-11 ENCOUNTER — Other Ambulatory Visit: Payer: Self-pay

## 2020-12-11 ENCOUNTER — Ambulatory Visit (INDEPENDENT_AMBULATORY_CARE_PROVIDER_SITE_OTHER): Payer: Medicare Other | Admitting: Orthopaedic Surgery

## 2020-12-11 DIAGNOSIS — S20212A Contusion of left front wall of thorax, initial encounter: Secondary | ICD-10-CM | POA: Diagnosis not present
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
## 2020-12-13 DIAGNOSIS — S20212A Contusion of left front wall of thorax, initial encounter: Secondary | ICD-10-CM | POA: Insufficient documentation

## 2020-12-19 ENCOUNTER — Emergency Department (HOSPITAL_COMMUNITY)
Admission: EM | Admit: 2020-12-19 | Discharge: 2020-12-19 | Disposition: A | Discharge status: Home / Self Care | Payer: Medicare Other | Attending: Emergency Medicine | Admitting: Emergency Medicine

## 2020-12-19 ENCOUNTER — Other Ambulatory Visit: Payer: Self-pay

## 2020-12-19 ENCOUNTER — Encounter (HOSPITAL_COMMUNITY): Payer: Self-pay | Admitting: *Deleted

## 2020-12-19 ENCOUNTER — Emergency Department (HOSPITAL_COMMUNITY): Payer: Medicare Other

## 2020-12-19 DIAGNOSIS — Z96653 Presence of artificial knee joint, bilateral: Secondary | ICD-10-CM | POA: Diagnosis not present

## 2020-12-19 DIAGNOSIS — B370 Candidal stomatitis: Secondary | ICD-10-CM | POA: Insufficient documentation

## 2020-12-19 DIAGNOSIS — G8929 Other chronic pain: Secondary | ICD-10-CM | POA: Insufficient documentation

## 2020-12-19 DIAGNOSIS — R0781 Pleurodynia: Secondary | ICD-10-CM

## 2020-12-19 DIAGNOSIS — N183 Chronic kidney disease, stage 3 unspecified: Secondary | ICD-10-CM | POA: Insufficient documentation

## 2020-12-19 DIAGNOSIS — M25551 Pain in right hip: Secondary | ICD-10-CM | POA: Diagnosis not present

## 2020-12-19 DIAGNOSIS — I129 Hypertensive chronic kidney disease with stage 1 through stage 4 chronic kidney disease, or unspecified chronic kidney disease: Secondary | ICD-10-CM | POA: Diagnosis not present

## 2020-12-19 DIAGNOSIS — J449 Chronic obstructive pulmonary disease, unspecified: Secondary | ICD-10-CM | POA: Diagnosis not present

## 2020-12-19 DIAGNOSIS — R918 Other nonspecific abnormal finding of lung field: Secondary | ICD-10-CM | POA: Diagnosis not present

## 2020-12-19 DIAGNOSIS — W182XXA Fall in (into) shower or empty bathtub, initial encounter: Secondary | ICD-10-CM | POA: Diagnosis not present

## 2020-12-19 DIAGNOSIS — Z79899 Other long term (current) drug therapy: Secondary | ICD-10-CM | POA: Diagnosis not present

## 2020-12-19 DIAGNOSIS — M47816 Spondylosis without myelopathy or radiculopathy, lumbar region: Secondary | ICD-10-CM | POA: Diagnosis not present

## 2020-12-19 MED ORDER — NYSTATIN 100000 UNIT/ML MT SUSP: OROMUCOSAL | 0 refills | Status: DC

## 2020-12-19 MED ORDER — MORPHINE SULFATE (PF) 4 MG/ML IV SOLN
8.0000 mg | Freq: Once | INTRAVENOUS | Status: AC
Start: 2020-12-19 — End: 2020-12-19
  Administered 2020-12-19: 8 mg via INTRAMUSCULAR
  Filled 2020-12-19: qty 2

## 2020-12-19 NOTE — ED Triage Notes (Signed)
Pt states she was getting out of the bathtub x 5 days and slipped and fell hitting her left ribs and right hip; pt states she has pain when taking a deep breath

## 2020-12-19 NOTE — Discharge Instructions (Signed)
Your xrays were negative for any acute injury. Please follow up with your prescribing physician for increased pain control. Contact a health care provider if: You have a fever. Your chest pain becomes worse. You have new symptoms. Get help right away if: You have nausea or vomiting. You feel sweaty or light-headed. You have a cough with mucus from your lungs (sputum) or you cough up blood. You develop shortness of breath.
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
## 2020-12-23 DIAGNOSIS — R5383 Other fatigue: Secondary | ICD-10-CM | POA: Diagnosis not present

## 2020-12-23 DIAGNOSIS — S22029B Unspecified fracture of second thoracic vertebra, initial encounter for open fracture: Secondary | ICD-10-CM | POA: Diagnosis not present

## 2020-12-23 DIAGNOSIS — Z79899 Other long term (current) drug therapy: Secondary | ICD-10-CM | POA: Diagnosis not present

## 2020-12-23 DIAGNOSIS — K219 Gastro-esophageal reflux disease without esophagitis: Secondary | ICD-10-CM | POA: Diagnosis not present

## 2020-12-23 DIAGNOSIS — J45909 Unspecified asthma, uncomplicated: Secondary | ICD-10-CM | POA: Diagnosis not present

## 2020-12-23 DIAGNOSIS — M545 Low back pain, unspecified: Secondary | ICD-10-CM | POA: Diagnosis not present

## 2020-12-23 DIAGNOSIS — E7849 Other hyperlipidemia: Secondary | ICD-10-CM | POA: Diagnosis not present

## 2020-12-24 DIAGNOSIS — M25551 Pain in right hip: Secondary | ICD-10-CM | POA: Diagnosis not present

## 2020-12-24 DIAGNOSIS — M545 Low back pain, unspecified: Secondary | ICD-10-CM | POA: Diagnosis not present

## 2020-12-25 ENCOUNTER — Telehealth: Payer: Self-pay | Admitting: Internal Medicine

## 2020-12-25 NOTE — Telephone Encounter (Signed)
Routing to Roseanne Kaufman, NP.  Pt has upcoming OV 03/10/21.

## 2020-12-25 NOTE — Telephone Encounter (Signed)
Pt was seen mid March and called today to let Roseanne Kaufman, NP know that she was ready to schedule her colonoscopy. [phone]

## 2020-12-29 ENCOUNTER — Encounter: Payer: Self-pay | Admitting: *Deleted

## 2020-12-29 ENCOUNTER — Other Ambulatory Visit: Payer: Self-pay | Admitting: *Deleted

## 2020-12-29 DIAGNOSIS — I2510 Atherosclerotic heart disease of native coronary artery without angina pectoris: Secondary | ICD-10-CM

## 2020-12-29 DIAGNOSIS — E782 Mixed hyperlipidemia: Secondary | ICD-10-CM

## 2020-12-29 NOTE — Telephone Encounter (Signed)
She is not due till next year. Unless overt GI bleeding or other concerns, will hold off. If she wants to have a sooner appt to discuss, we can do that.

## 2020-12-30 DIAGNOSIS — M71111 Other infective bursitis, right shoulder: Secondary | ICD-10-CM | POA: Diagnosis not present

## 2020-12-30 NOTE — Telephone Encounter (Signed)
Pt wants sooner appt to discuss having tcs early d/t bowel issues.  Stacey please move up appt per Vicente Males.

## 2020-12-30 NOTE — Telephone Encounter (Signed)
Tried to call pt, LMOVM for return call. 

## 2020-12-30 NOTE — Telephone Encounter (Signed)
There are no sooner openings, we are now scheduling into July

## 2020-12-30 NOTE — Telephone Encounter (Signed)
FYI to Terril.

## 2021-01-02 DIAGNOSIS — Z79899 Other long term (current) drug therapy: Secondary | ICD-10-CM | POA: Diagnosis not present

## 2021-01-02 DIAGNOSIS — Z79891 Long term (current) use of opiate analgesic: Secondary | ICD-10-CM | POA: Diagnosis not present

## 2021-01-04 NOTE — Progress Notes (Deleted)
Cardiology Office Note  Date: 01/04/2021   ID: Michelle Zavala, DOB 11/06/1945, MRN [id number]  PCP:  Neale Burly, MD  Cardiologist:  Rozann Lesches, MD Electrophysiologist:  None   No chief complaint on file.   History of Present Illness: Michelle Zavala is a 75 y.o. female last seen in February by Mr. Leonides Sake NP.  Follow-up Myoview in November 2021 was low risk, no significant ischemic territories and normal LVEF.  Lab work in January noted below, LDL 129 at that point.  She did not tolerate bisoprolol (switched from Toprol-XL to see if this would improve her shortness of breath).  States that it made her feel weaker.  Also did not tolerate Crestor for similar reasons.  Past Medical History:  Diagnosis Date  . Anxiety   . Arthritis   . C2 cervical fracture ([place]) 09/17/13   Traumatic fracture witth minimal displacement  . Chronic lung disease    Fibrosis - Dr. Koleen Nimrod  . COPD (chronic obstructive pulmonary disease) ([place])   . Coronary atherosclerosis of native coronary artery    Mild atherosclerosis 3/10, LVEF 60-65%  . Depression   . Diverticulosis   . Essential hypertension   . GERD (gastroesophageal reflux disease)   . PSVT (paroxysmal supraventricular tachycardia) ([place])     Past Surgical History:  Procedure Laterality Date  . ABDOMINAL HYSTERECTOMY    . ANTERIOR RELEASE VERTEBRAL BODY W/ POSTERIOR FUSION    . BACTERIAL OVERGROWTH TEST N/A 02/19/2016   Procedure: BACTERIAL OVERGROWTH TEST;  Surgeon: Daneil Dolin, MD;  [facility];  Service: Endoscopy;  Laterality: N/A;  0700  . BIOPSY N/A 08/04/2015   Procedure: BIOPSY;  Surgeon: Daneil Dolin, MD;  Location: [facility];  Service: Endoscopy;  Laterality: N/A;  Gastric  . BIOPSY  02/21/2017   Procedure: BIOPSY;  Surgeon: Daneil Dolin, MD;  [facility];  Service: Endoscopy;;  ascending colon biopsy   . BIOPSY  08/21/2020   Procedure: BIOPSY;  Surgeon: Daneil Dolin, MD;  [facility];  Service: Endoscopy;;  . cataract surgery    . CESAREAN SECTION    . CHOLECYSTECTOMY  1973  . COLONOSCOPY  June 2016   Dr. Britta Mccreedy: moderate diverticulosis in sigmoid colon, surveillance in 5 years   . COLONOSCOPY WITH PROPOFOL N/A 02/21/2017   Dr. Gala Romney: diverticulosis in sigmoid colon, tubular adenomas, segmental biopsies benign. Surveillance in 5 years   . ESOPHAGOGASTRODUODENOSCOPY (EGD) WITH PROPOFOL N/A 08/04/2015   Dr. Rourk:abnormal gastric mucosa s/p biopsy. Reactive gastropathy. Negative H.pylori  . ESOPHAGOGASTRODUODENOSCOPY (EGD) WITH PROPOFOL N/A 02/21/2017   Dr. Gala Romney: empiric esophageal dilatation, small hiatal hernia, otherwise normal  . ESOPHAGOGASTRODUODENOSCOPY (EGD) WITH PROPOFOL N/A 08/21/2020   normal esophagus s/p dilation. Erythematous mucosa in stomach of doubtful clinical significant s/p biopsy. Negative H.pylori. Mild reactive gastropathy.   [name] HERNIA REPAIR N/A 12/03/2015   Procedure: [name] HERNIORRHAPHY WITH MESH;  Surgeon: Aviva Signs, MD;  Location: [facility];  Service: General;  Laterality: N/A;  . INSERTION OF MESH  12/03/2015   Procedure: INSERTION OF MESH;  Surgeon: Aviva Signs, MD;  Location: [facility];  Service: General;;  . IR RADIOLOGIST EVAL & MGMT  09/22/2017  . MALONEY DILATION N/A 02/21/2017   Procedure: [name] DILATION;  Surgeon: Daneil Dolin, MD;  [facility];  Service: Endoscopy;  Laterality: N/A;  . [name] DILATION N/A 08/21/2020   Procedure: [name] DILATION;  Surgeon: Daneil Dolin, MD;  [facility];  Service: Endoscopy;  Laterality: N/A;  . POLYPECTOMY  02/21/2017   Procedure: POLYPECTOMY;  Surgeon: Daneil Dolin, MD;  [facility];  Service: Endoscopy;;  hepatic flexure polyp cs  . TMJ ARTHROSCOPY Bilateral 02/04/2015   Procedure: BILATERAL TEMPOROMANDIBULAR JOINT (TMJ) ARTHROSCOPY MENISECTOMY WITH FAT GRAFT FROM ABDOMEN ;  Surgeon: Diona Browner, DDS;  Location: [facility];  Service: Oral  Surgery;  Laterality: Bilateral;  . TOTAL KNEE ARTHROPLASTY Bilateral   . TUBAL LIGATION      Current Outpatient Medications  Medication Sig Dispense Refill  . acetaminophen (TYLENOL) 650 MG CR tablet Take 1,300 mg by mouth every 8 (eight) hours as needed for pain.     [name] albuterol (VENTOLIN HFA) 108 (90 Base) MCG/ACT inhaler Inhale 2 puffs into the lungs every 4 (four) hours as needed for wheezing or shortness of breath.    [name] amitriptyline (ELAVIL) 75 MG tablet Take 75 mg by mouth at bedtime.  6  . Ascorbic Acid (VITAMIN C) 1000 MG tablet Take 1,000 mg by mouth daily.    [name] atorvastatin (LIPITOR) 20 MG tablet Take 10 mg by mouth daily.    . Budeson-Glycopyrrol-Formoterol (BREZTRI AEROSPHERE) 160-9-4.8 MCG/ACT AERO Inhale 2 puffs into the lungs in the morning and at bedtime. 5.9 g 5  . Budeson-Glycopyrrol-Formoterol (BREZTRI AEROSPHERE) 160-9-4.8 MCG/ACT AERO Inhale 2 puffs into the lungs in the morning and at bedtime. 10.7 g 0  . cholecalciferol (VITAMIN D3) 25 MCG (1000 UNIT) tablet Take 1,000 Units by mouth daily.    [name] Calcium (STOOL SOFTENER PO) Take by mouth daily at 12 noon. Takes 2 daily every other day    . hydrochlorothiazide (HYDRODIURIL) 25 MG tablet Take 25 mg by mouth every morning.    [name] ipratropium (ATROVENT) 0.02 % nebulizer solution Take 0.5 mg by nebulization every 4 (four) hours as needed for wheezing or shortness of breath.    [name] LORazepam (ATIVAN) 1 MG tablet Take 1 mg by mouth 2 (two) times daily.    . metoprolol succinate (TOPROL-XL) 50 MG 24 hr tablet Take 1 tablet (50 mg total) by mouth daily. 90 tablet 1  . Multiple Vitamins-Minerals (PRESERVISION AREDS PO) Take 1 tablet by mouth in the morning and at bedtime.     [name] nystatin (MYCOSTATIN) 100000 UNIT/ML suspension Place one-half of the dose (total 5 mLs) in each side of mouth and retain in mouth as long as possible before swallowing for 7 days. 150 mL 0  . Omega-3 Fatty Acids (FISH OIL) 1000 MG CAPS Take 3,000 mg  by mouth daily.     . pantoprazole (PROTONIX) 40 MG tablet Take 1 tablet (40 mg total) by mouth 2 (two) times daily. 60 tablet 1  . potassium chloride SA (KLOR-CON) 20 MEQ tablet Take 20 mEq by mouth daily.    . sertraline (ZOLOFT) 100 MG tablet Take 100 mg by mouth daily.    . vitamin B-12 (CYANOCOBALAMIN) 1000 MCG tablet Take 1,000 mcg by mouth daily.    . Wheat Dextrin (BENEFIBER) POWD Take by mouth in the morning and at bedtime. As needed     No current facility-administered medications for this visit.   Allergies:  Sulfonamide derivatives   Social History: The patient  reports that she has never smoked. She has never used smokeless tobacco. She reports that she does not drink alcohol and does not use drugs.   Family History: The patient's family history includes Colon cancer in her son; Coronary artery disease in  her brother, father, and sister; Heart disease in her father.   ROS:  Please see the history of present illness. Otherwise, complete review of systems is positive for {NONE DEFAULTED:18576::"none"}.  All other systems are reviewed and negative.   Physical Exam: VS:  There were no vitals taken for this visit., BMI There is no height or weight on file to calculate BMI.  Wt Readings from Last 3 Encounters:  12/19/20 198 lb (89.8 kg)  12/09/20 191 lb (86.6 kg)  12/04/20 190 lb (86.2 kg)    General: Patient appears comfortable at rest. HEENT: Conjunctiva and lids normal, oropharynx clear with moist mucosa. Neck: Supple, no elevated JVP or carotid bruits, no thyromegaly. Lungs: Clear to auscultation, nonlabored breathing at rest. Cardiac: Regular rate and rhythm, no S3 or significant systolic murmur, no pericardial rub. Abdomen: Soft, nontender, no hepatomegaly, bowel sounds present, no guarding or rebound. Extremities: No pitting edema, distal pulses 2+. Skin: Warm and dry. Musculoskeletal: No kyphosis. Neuropsychiatric: Alert and oriented x3, affect grossly  appropriate.  ECG:  An ECG dated 10/31/2020 was personally reviewed today and demonstrated:  Sinus rhythm with left anterior fascicular block, nonspecific ST-T changes.  Recent Labwork: 07/30/2020: ALT 10; AST 16; BUN 16; Creat 1.10; Hemoglobin 13.6; Platelets 276; Potassium 3.5; Sodium 139     Component Value Date/Time   CHOL 223 (H) 09/23/2020 1454   TRIG 220 (H) 09/23/2020 1454   HDL 57 09/23/2020 1454   CHOLHDL 3.9 09/23/2020 1454   LDLCALC 129 (H) 09/23/2020 1454    Other Studies Reviewed Today:  Lexiscan Myoview 07/28/2020:  No diagnostic ST segment changes indicate ischemia.  Small, mild intensity, fixed apical lateral defect that is most consistent with soft tissue attenuation. No large ischemic territories.  This is a low risk study.  Nuclear stress EF: 55%.  Assessment and Plan:   Medication Adjustments/Labs and Tests Ordered: Current medicines are reviewed at length with the patient today.  Concerns regarding medicines are outlined above.   Tests Ordered: No orders of the defined types were placed in this encounter.   Medication Changes: No orders of the defined types were placed in this encounter.   Disposition:  Follow up {follow up:15908}  Signed, Satira Sark, MD, [facility] 01/04/2021 11:16 AM    [facility] [place]. [address] [place], [place] [address] Phone: [phone]; Fax: [phone]

## 2021-01-05 ENCOUNTER — Ambulatory Visit: Payer: Medicare Other | Admitting: Cardiology

## 2021-01-05 DIAGNOSIS — S20211A Contusion of right front wall of thorax, initial encounter: Secondary | ICD-10-CM | POA: Diagnosis not present

## 2021-01-05 DIAGNOSIS — M47812 Spondylosis without myelopathy or radiculopathy, cervical region: Secondary | ICD-10-CM | POA: Diagnosis not present

## 2021-01-05 DIAGNOSIS — Z882 Allergy status to sulfonamides status: Secondary | ICD-10-CM | POA: Diagnosis not present

## 2021-01-05 DIAGNOSIS — S199XXA Unspecified injury of neck, initial encounter: Secondary | ICD-10-CM | POA: Diagnosis not present

## 2021-01-05 DIAGNOSIS — S52251A Displaced comminuted fracture of shaft of ulna, right arm, initial encounter for closed fracture: Secondary | ICD-10-CM | POA: Diagnosis not present

## 2021-01-05 DIAGNOSIS — S8002XA Contusion of left knee, initial encounter: Secondary | ICD-10-CM | POA: Diagnosis not present

## 2021-01-05 DIAGNOSIS — S40011A Contusion of right shoulder, initial encounter: Secondary | ICD-10-CM | POA: Diagnosis not present

## 2021-01-05 DIAGNOSIS — S161XXA Strain of muscle, fascia and tendon at neck level, initial encounter: Secondary | ICD-10-CM | POA: Diagnosis not present

## 2021-01-05 DIAGNOSIS — I10 Essential (primary) hypertension: Secondary | ICD-10-CM | POA: Diagnosis not present

## 2021-01-05 DIAGNOSIS — M25511 Pain in right shoulder: Secondary | ICD-10-CM | POA: Diagnosis not present

## 2021-01-05 DIAGNOSIS — Z743 Need for continuous supervision: Secondary | ICD-10-CM | POA: Diagnosis not present

## 2021-01-05 DIAGNOSIS — M5136 Other intervertebral disc degeneration, lumbar region: Secondary | ICD-10-CM | POA: Diagnosis not present

## 2021-01-05 DIAGNOSIS — R6889 Other general symptoms and signs: Secondary | ICD-10-CM | POA: Diagnosis not present

## 2021-01-05 DIAGNOSIS — S59919A Unspecified injury of unspecified forearm, initial encounter: Secondary | ICD-10-CM | POA: Diagnosis not present

## 2021-01-05 DIAGNOSIS — M4322 Fusion of spine, cervical region: Secondary | ICD-10-CM | POA: Diagnosis not present

## 2021-01-05 DIAGNOSIS — I444 Left anterior fascicular block: Secondary | ICD-10-CM | POA: Diagnosis not present

## 2021-01-05 DIAGNOSIS — S0990XA Unspecified injury of head, initial encounter: Secondary | ICD-10-CM | POA: Diagnosis not present

## 2021-01-05 DIAGNOSIS — M25519 Pain in unspecified shoulder: Secondary | ICD-10-CM | POA: Diagnosis not present

## 2021-01-05 DIAGNOSIS — S52231A Displaced oblique fracture of shaft of right ulna, initial encounter for closed fracture: Secondary | ICD-10-CM | POA: Diagnosis not present

## 2021-01-05 DIAGNOSIS — M25562 Pain in left knee: Secondary | ICD-10-CM | POA: Diagnosis not present

## 2021-01-05 DIAGNOSIS — M7989 Other specified soft tissue disorders: Secondary | ICD-10-CM | POA: Diagnosis not present

## 2021-01-05 DIAGNOSIS — M4040 Postural lordosis, site unspecified: Secondary | ICD-10-CM | POA: Diagnosis not present

## 2021-01-05 DIAGNOSIS — R079 Chest pain, unspecified: Secondary | ICD-10-CM | POA: Diagnosis not present

## 2021-01-05 DIAGNOSIS — R9082 White matter disease, unspecified: Secondary | ICD-10-CM | POA: Diagnosis not present

## 2021-01-05 DIAGNOSIS — E782 Mixed hyperlipidemia: Secondary | ICD-10-CM

## 2021-01-05 DIAGNOSIS — I6789 Other cerebrovascular disease: Secondary | ICD-10-CM | POA: Diagnosis not present

## 2021-01-05 DIAGNOSIS — I471 Supraventricular tachycardia: Secondary | ICD-10-CM

## 2021-01-05 DIAGNOSIS — S52601A Unspecified fracture of lower end of right ulna, initial encounter for closed fracture: Secondary | ICD-10-CM | POA: Diagnosis not present

## 2021-01-05 DIAGNOSIS — M533 Sacrococcygeal disorders, not elsewhere classified: Secondary | ICD-10-CM | POA: Diagnosis not present

## 2021-01-05 DIAGNOSIS — R9431 Abnormal electrocardiogram [ECG] [EKG]: Secondary | ICD-10-CM | POA: Diagnosis not present

## 2021-01-05 DIAGNOSIS — Z981 Arthrodesis status: Secondary | ICD-10-CM | POA: Diagnosis not present

## 2021-01-06 NOTE — Telephone Encounter (Signed)
Please place on cancellation list  

## 2021-01-07 ENCOUNTER — Telehealth: Payer: Self-pay | Admitting: Cardiology

## 2021-01-07 NOTE — Telephone Encounter (Signed)
Pt called stating the [facility] nurse came out and told her that her heart was pumping to hard and told her she needed to make an apt for an EKG.   Offered to r/s the apt the pt had to cancel on 4/25 for 5/3 w/ A. Leonides Sake, but pt would like something done sooner.  Please call [phone]

## 2021-01-07 NOTE — Telephone Encounter (Signed)
Reports heart beating too fast when insurance nurse came out to house today but was unable to give reading. BP 118/76 & HR 101. Unable to check BP and HR at home. Denies chest pain or sob. Reports dizziness, nausea but has nausea pills to take. Offered appointment to see Katina Dung on 01/13/21. Patient declined and stated, "that's okay, I won't need an appointment then". Advised to contact PCP about dizziness and nausea. Verbalized understanding.

## 2021-01-09 DIAGNOSIS — S20211A Contusion of right front wall of thorax, initial encounter: Secondary | ICD-10-CM | POA: Diagnosis not present

## 2021-01-09 DIAGNOSIS — M25562 Pain in left knee: Secondary | ICD-10-CM | POA: Diagnosis not present

## 2021-01-09 DIAGNOSIS — S52254A Nondisplaced comminuted fracture of shaft of ulna, right arm, initial encounter for closed fracture: Secondary | ICD-10-CM | POA: Diagnosis not present

## 2021-01-13 ENCOUNTER — Encounter: Payer: Self-pay | Admitting: Internal Medicine

## 2021-01-16 DIAGNOSIS — S52254A Nondisplaced comminuted fracture of shaft of ulna, right arm, initial encounter for closed fracture: Secondary | ICD-10-CM | POA: Diagnosis not present

## 2021-01-21 DIAGNOSIS — I10 Essential (primary) hypertension: Secondary | ICD-10-CM | POA: Diagnosis not present

## 2021-01-21 DIAGNOSIS — L278 Dermatitis due to other substances taken internally: Secondary | ICD-10-CM | POA: Diagnosis not present

## 2021-01-21 DIAGNOSIS — K219 Gastro-esophageal reflux disease without esophagitis: Secondary | ICD-10-CM | POA: Diagnosis not present

## 2021-01-21 DIAGNOSIS — Z0000 Encounter for general adult medical examination without abnormal findings: Secondary | ICD-10-CM | POA: Diagnosis not present

## 2021-01-26 ENCOUNTER — Telehealth: Payer: Self-pay | Admitting: *Deleted

## 2021-01-26 NOTE — Telephone Encounter (Signed)
Left message to return call 

## 2021-01-26 NOTE — Telephone Encounter (Signed)
RE: FLP, LFT - 6-8 WEEKS Received: 1 week ago [place], Debara Pickett, LPN  [place], Debara Pickett, LPN MAILED ORDERS TODAY 12/29/20        Previous Messages   ----- Message -----  From: Laurine Blazer, LPN  Sent: 03/16/2594 63:87 AM EDT  To: Laurine Blazer, LPN  Subject: FLP, LFT - 6-8 WEEKS

## 2021-01-27 DIAGNOSIS — S52254A Nondisplaced comminuted fracture of shaft of ulna, right arm, initial encounter for closed fracture: Secondary | ICD-10-CM | POA: Diagnosis not present

## 2021-01-29 DIAGNOSIS — I712 Thoracic aortic aneurysm, without rupture: Secondary | ICD-10-CM | POA: Diagnosis not present

## 2021-01-29 DIAGNOSIS — R9431 Abnormal electrocardiogram [ECG] [EKG]: Secondary | ICD-10-CM | POA: Diagnosis not present

## 2021-01-29 DIAGNOSIS — R0600 Dyspnea, unspecified: Secondary | ICD-10-CM | POA: Diagnosis not present

## 2021-01-29 DIAGNOSIS — J441 Chronic obstructive pulmonary disease with (acute) exacerbation: Secondary | ICD-10-CM | POA: Diagnosis not present

## 2021-01-29 DIAGNOSIS — I10 Essential (primary) hypertension: Secondary | ICD-10-CM | POA: Diagnosis not present

## 2021-01-29 DIAGNOSIS — Z882 Allergy status to sulfonamides status: Secondary | ICD-10-CM | POA: Diagnosis not present

## 2021-01-29 DIAGNOSIS — I700 Atherosclerosis of aorta: Secondary | ICD-10-CM | POA: Diagnosis not present

## 2021-01-29 DIAGNOSIS — R0602 Shortness of breath: Secondary | ICD-10-CM | POA: Diagnosis not present

## 2021-01-29 DIAGNOSIS — R002 Palpitations: Secondary | ICD-10-CM | POA: Diagnosis not present

## 2021-01-29 DIAGNOSIS — R000 Tachycardia, unspecified: Secondary | ICD-10-CM | POA: Diagnosis not present

## 2021-01-29 DIAGNOSIS — R059 Cough, unspecified: Secondary | ICD-10-CM | POA: Diagnosis not present

## 2021-01-29 DIAGNOSIS — I444 Left anterior fascicular block: Secondary | ICD-10-CM | POA: Diagnosis not present

## 2021-01-30 ENCOUNTER — Telehealth: Payer: Self-pay | Admitting: Cardiology

## 2021-01-30 MED ORDER — METOPROLOL SUCCINATE ER 50 MG PO TB24: 50.0000 mg | ORAL_TABLET | Freq: Every day | ORAL | 1 refills | Status: DC

## 2021-01-30 NOTE — Telephone Encounter (Signed)
Patient needs clarification on her metoprolol succinate. She states that the strength is not correct.

## 2021-01-30 NOTE — Telephone Encounter (Signed)
Clarified to pt that she is to take one (1) tablet by mouth daily of metoprolol succinate 50 mg per Dr. Myles Gip phone note with the pt.   Pt was originally d/c on Toprol- XL and switched to Bisoprolol which she did not tolerate well.   Pt also needed refill for her medication, which was sent in to the pharmacy.

## 2021-02-02 ENCOUNTER — Ambulatory Visit (INDEPENDENT_AMBULATORY_CARE_PROVIDER_SITE_OTHER): Payer: Medicare Other | Admitting: Family Medicine

## 2021-02-02 ENCOUNTER — Encounter: Payer: Self-pay | Admitting: Family Medicine

## 2021-02-02 VITALS — BP 130/84 | HR 90 | Ht 65.0 in | Wt 181.2 lb

## 2021-02-02 DIAGNOSIS — E782 Mixed hyperlipidemia: Secondary | ICD-10-CM | POA: Diagnosis not present

## 2021-02-02 MED ORDER — ATORVASTATIN CALCIUM 10 MG PO TABS: 10.0000 mg | ORAL_TABLET | Freq: Every day | ORAL | 3 refills | Status: DC
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
## 2021-02-02 NOTE — Patient Instructions (Addendum)
Medication Instructions:   Your physician has recommended you make the following change in your medication:   Restart atorvastatin (lipitor) 10 mg daily in the evening  Continue other medications the same  Labwork: Your physician recommends that you return for a FASTING lipid profile in 2 months (1st week of August 2022). Please do not eat or drink for at least 8 hours when you have this done. You may take your medications that morning with a sip of water.  [facility] [employer] or [employer] of your choice  Testing/Procedures:  none  Follow-Up:  Your physician recommends that you schedule a follow-up appointment in: 6 months. You will receive a reminder call or letter in the mail in about 4 months reminding you to call and schedule your appointment. If you don't receive this letter, please contact our office.  Any Other Special Instructions Will Be Listed Below (If Applicable).  If you need a refill on your cardiac medications before your next appointment, please call your pharmacy.

## 2021-02-03 NOTE — Telephone Encounter (Signed)
Discussed at [place] yesterday with Katina Dung, NP

## 2021-02-13 DIAGNOSIS — S52254D Nondisplaced comminuted fracture of shaft of ulna, right arm, subsequent encounter for closed fracture with routine healing: Secondary | ICD-10-CM | POA: Diagnosis not present

## 2021-02-17 ENCOUNTER — Ambulatory Visit: Payer: Medicare Other | Admitting: Pulmonary Disease

## 2021-02-22 NOTE — Congregational Nurse Program (Signed)
  Dept: [phone]   Congregational Nurse Program Note  Date of Encounter: 03/06/2021  Past Medical History: Past Medical History:  Diagnosis Date   Anxiety    Arthritis    C2 cervical fracture ([place]) 09/17/13   Traumatic fracture witth minimal displacement   Chronic lung disease    Fibrosis - Dr. Koleen Nimrod   COPD (chronic obstructive pulmonary disease) ([place])    Coronary atherosclerosis of native coronary artery    Mild atherosclerosis 3/10, LVEF 60-65%   Depression    Diverticulosis    Essential hypertension    GERD (gastroesophageal reflux disease)    PSVT (paroxysmal supraventricular tachycardia) ([place])     Encounter Details:  CNP Questionnaire - 02/03/21 1215       Questionnaire   Do you give verbal consent to treat you today? Yes    Visit Setting Church or Counselling psychologist Patient Served At [employer], [employer]    Patient Status Not Applicable    Medical Provider Yes    Insurance Medicaid;Medicare    Intervention Assess (including screenings)           Stated she has Hypertension and wants to see if her BP is within normal limits. No complaints or concerns. BP 129/87; Pulse 99. Mack Guise, [place], [phone]

## 2021-03-10 ENCOUNTER — Ambulatory Visit: Payer: Medicare Other | Admitting: Gastroenterology

## 2021-03-10 DIAGNOSIS — I716 Thoracoabdominal aortic aneurysm, without rupture: Secondary | ICD-10-CM | POA: Diagnosis not present

## 2021-03-10 DIAGNOSIS — M5459 Other low back pain: Secondary | ICD-10-CM | POA: Diagnosis not present

## 2021-03-10 DIAGNOSIS — I700 Atherosclerosis of aorta: Secondary | ICD-10-CM | POA: Diagnosis not present

## 2021-03-11 DIAGNOSIS — M25552 Pain in left hip: Secondary | ICD-10-CM | POA: Diagnosis not present

## 2021-03-11 DIAGNOSIS — S52254D Nondisplaced comminuted fracture of shaft of ulna, right arm, subsequent encounter for closed fracture with routine healing: Secondary | ICD-10-CM | POA: Diagnosis not present

## 2021-03-11 DIAGNOSIS — M545 Low back pain, unspecified: Secondary | ICD-10-CM | POA: Diagnosis not present

## 2021-04-10 DIAGNOSIS — M7981 Nontraumatic hematoma of soft tissue: Secondary | ICD-10-CM | POA: Diagnosis not present

## 2021-04-10 DIAGNOSIS — Z7951 Long term (current) use of inhaled steroids: Secondary | ICD-10-CM | POA: Diagnosis not present

## 2021-04-10 DIAGNOSIS — S40022A Contusion of left upper arm, initial encounter: Secondary | ICD-10-CM | POA: Diagnosis not present

## 2021-04-10 DIAGNOSIS — J439 Emphysema, unspecified: Secondary | ICD-10-CM | POA: Diagnosis not present

## 2021-04-10 DIAGNOSIS — K219 Gastro-esophageal reflux disease without esophagitis: Secondary | ICD-10-CM | POA: Diagnosis not present

## 2021-04-10 DIAGNOSIS — S40021A Contusion of right upper arm, initial encounter: Secondary | ICD-10-CM | POA: Diagnosis not present

## 2021-04-10 DIAGNOSIS — E876 Hypokalemia: Secondary | ICD-10-CM | POA: Diagnosis not present

## 2021-04-10 DIAGNOSIS — F32A Depression, unspecified: Secondary | ICD-10-CM | POA: Diagnosis not present

## 2021-04-10 DIAGNOSIS — I10 Essential (primary) hypertension: Secondary | ICD-10-CM | POA: Diagnosis not present

## 2021-04-10 DIAGNOSIS — Z79899 Other long term (current) drug therapy: Secondary | ICD-10-CM | POA: Diagnosis not present

## 2021-04-13 DIAGNOSIS — D692 Other nonthrombocytopenic purpura: Secondary | ICD-10-CM | POA: Diagnosis not present

## 2021-04-14 DIAGNOSIS — N3001 Acute cystitis with hematuria: Secondary | ICD-10-CM | POA: Diagnosis not present

## 2021-04-14 DIAGNOSIS — Z8639 Personal history of other endocrine, nutritional and metabolic disease: Secondary | ICD-10-CM | POA: Diagnosis not present

## 2021-04-14 DIAGNOSIS — T148XXA Other injury of unspecified body region, initial encounter: Secondary | ICD-10-CM | POA: Diagnosis not present

## 2021-04-14 DIAGNOSIS — I951 Orthostatic hypotension: Secondary | ICD-10-CM | POA: Diagnosis not present

## 2021-04-14 DIAGNOSIS — E860 Dehydration: Secondary | ICD-10-CM | POA: Diagnosis not present

## 2021-04-14 DIAGNOSIS — R531 Weakness: Secondary | ICD-10-CM | POA: Diagnosis not present

## 2021-04-14 DIAGNOSIS — G44209 Tension-type headache, unspecified, not intractable: Secondary | ICD-10-CM | POA: Diagnosis not present

## 2021-04-14 DIAGNOSIS — M25511 Pain in right shoulder: Secondary | ICD-10-CM | POA: Diagnosis not present

## 2021-04-20 DIAGNOSIS — Z872 Personal history of diseases of the skin and subcutaneous tissue: Secondary | ICD-10-CM | POA: Diagnosis not present

## 2021-04-20 DIAGNOSIS — R300 Dysuria: Secondary | ICD-10-CM | POA: Diagnosis not present

## 2021-04-20 DIAGNOSIS — W19XXXA Unspecified fall, initial encounter: Secondary | ICD-10-CM | POA: Diagnosis not present

## 2021-04-20 DIAGNOSIS — M25511 Pain in right shoulder: Secondary | ICD-10-CM | POA: Diagnosis not present

## 2021-04-27 DIAGNOSIS — S52255A Nondisplaced comminuted fracture of shaft of ulna, left arm, initial encounter for closed fracture: Secondary | ICD-10-CM | POA: Diagnosis not present

## 2021-04-27 DIAGNOSIS — S40011A Contusion of right shoulder, initial encounter: Secondary | ICD-10-CM | POA: Diagnosis not present

## 2021-04-29 ENCOUNTER — Telehealth: Payer: Self-pay | Admitting: *Deleted

## 2021-04-29 NOTE — Telephone Encounter (Signed)
Reports she never started atorvastatin and did not want to try it.

## 2021-04-29 NOTE — Telephone Encounter (Signed)
-----   Message from Merlene Laughter, RN sent at 02/02/2021  4:58 PM EDT ----- Regarding: FOLLOW UP/FLP/QUINN DUE 1ST WEEK OF AUGUST 2022 IF ABLE TO TOLERATE ATORVASTATIN

## 2021-05-01 DIAGNOSIS — M7061 Trochanteric bursitis, right hip: Secondary | ICD-10-CM | POA: Diagnosis not present

## 2021-05-02 DIAGNOSIS — K5730 Diverticulosis of large intestine without perforation or abscess without bleeding: Secondary | ICD-10-CM | POA: Diagnosis not present

## 2021-05-02 DIAGNOSIS — M778 Other enthesopathies, not elsewhere classified: Secondary | ICD-10-CM | POA: Diagnosis not present

## 2021-05-02 DIAGNOSIS — M76891 Other specified enthesopathies of right lower limb, excluding foot: Secondary | ICD-10-CM | POA: Diagnosis not present

## 2021-05-02 DIAGNOSIS — R112 Nausea with vomiting, unspecified: Secondary | ICD-10-CM | POA: Diagnosis not present

## 2021-05-02 DIAGNOSIS — Z79899 Other long term (current) drug therapy: Secondary | ICD-10-CM | POA: Diagnosis not present

## 2021-05-02 DIAGNOSIS — M47816 Spondylosis without myelopathy or radiculopathy, lumbar region: Secondary | ICD-10-CM | POA: Diagnosis not present

## 2021-05-02 DIAGNOSIS — Z882 Allergy status to sulfonamides status: Secondary | ICD-10-CM | POA: Diagnosis not present

## 2021-05-02 DIAGNOSIS — E876 Hypokalemia: Secondary | ICD-10-CM | POA: Diagnosis not present

## 2021-05-02 DIAGNOSIS — S022XXA Fracture of nasal bones, initial encounter for closed fracture: Secondary | ICD-10-CM | POA: Diagnosis not present

## 2021-05-02 DIAGNOSIS — Z20822 Contact with and (suspected) exposure to covid-19: Secondary | ICD-10-CM | POA: Diagnosis not present

## 2021-05-02 DIAGNOSIS — M25551 Pain in right hip: Secondary | ICD-10-CM | POA: Diagnosis not present

## 2021-05-02 DIAGNOSIS — I10 Essential (primary) hypertension: Secondary | ICD-10-CM | POA: Diagnosis not present

## 2021-05-02 DIAGNOSIS — K219 Gastro-esophageal reflux disease without esophagitis: Secondary | ICD-10-CM | POA: Diagnosis not present

## 2021-05-02 DIAGNOSIS — W19XXXA Unspecified fall, initial encounter: Secondary | ICD-10-CM | POA: Diagnosis not present

## 2021-05-02 DIAGNOSIS — M76892 Other specified enthesopathies of left lower limb, excluding foot: Secondary | ICD-10-CM | POA: Diagnosis not present

## 2021-05-02 DIAGNOSIS — I700 Atherosclerosis of aorta: Secondary | ICD-10-CM | POA: Diagnosis not present

## 2021-05-02 DIAGNOSIS — J439 Emphysema, unspecified: Secondary | ICD-10-CM | POA: Diagnosis not present

## 2021-05-02 DIAGNOSIS — S7001XA Contusion of right hip, initial encounter: Secondary | ICD-10-CM | POA: Diagnosis not present

## 2021-05-02 DIAGNOSIS — R519 Headache, unspecified: Secondary | ICD-10-CM | POA: Diagnosis not present

## 2021-05-07 DIAGNOSIS — I700 Atherosclerosis of aorta: Secondary | ICD-10-CM | POA: Diagnosis not present

## 2021-05-07 DIAGNOSIS — K5900 Constipation, unspecified: Secondary | ICD-10-CM | POA: Diagnosis not present

## 2021-05-07 DIAGNOSIS — F32A Depression, unspecified: Secondary | ICD-10-CM | POA: Diagnosis not present

## 2021-05-07 DIAGNOSIS — D692 Other nonthrombocytopenic purpura: Secondary | ICD-10-CM | POA: Diagnosis not present

## 2021-05-07 DIAGNOSIS — K219 Gastro-esophageal reflux disease without esophagitis: Secondary | ICD-10-CM | POA: Diagnosis not present

## 2021-05-11 DIAGNOSIS — J0100 Acute maxillary sinusitis, unspecified: Secondary | ICD-10-CM | POA: Diagnosis not present

## 2021-05-14 ENCOUNTER — Ambulatory Visit: Payer: Medicare Other | Admitting: Gastroenterology

## 2021-05-22 DIAGNOSIS — R0781 Pleurodynia: Secondary | ICD-10-CM | POA: Diagnosis not present

## 2021-05-22 DIAGNOSIS — M25552 Pain in left hip: Secondary | ICD-10-CM | POA: Diagnosis not present

## 2021-05-22 DIAGNOSIS — R9431 Abnormal electrocardiogram [ECG] [EKG]: Secondary | ICD-10-CM | POA: Diagnosis not present

## 2021-05-22 DIAGNOSIS — W1839XA Other fall on same level, initial encounter: Secondary | ICD-10-CM | POA: Diagnosis not present

## 2021-05-22 DIAGNOSIS — S2020XA Contusion of thorax, unspecified, initial encounter: Secondary | ICD-10-CM | POA: Diagnosis not present

## 2021-05-22 DIAGNOSIS — S52201P Unspecified fracture of shaft of right ulna, subsequent encounter for closed fracture with malunion: Secondary | ICD-10-CM | POA: Diagnosis not present

## 2021-05-22 DIAGNOSIS — S20213A Contusion of bilateral front wall of thorax, initial encounter: Secondary | ICD-10-CM | POA: Diagnosis not present

## 2021-05-22 DIAGNOSIS — Z7951 Long term (current) use of inhaled steroids: Secondary | ICD-10-CM | POA: Diagnosis not present

## 2021-05-22 DIAGNOSIS — I444 Left anterior fascicular block: Secondary | ICD-10-CM | POA: Diagnosis not present

## 2021-05-22 DIAGNOSIS — S52601A Unspecified fracture of lower end of right ulna, initial encounter for closed fracture: Secondary | ICD-10-CM | POA: Diagnosis not present

## 2021-05-22 DIAGNOSIS — J439 Emphysema, unspecified: Secondary | ICD-10-CM | POA: Diagnosis not present

## 2021-05-22 DIAGNOSIS — K219 Gastro-esophageal reflux disease without esophagitis: Secondary | ICD-10-CM | POA: Diagnosis not present

## 2021-05-22 DIAGNOSIS — Z79899 Other long term (current) drug therapy: Secondary | ICD-10-CM | POA: Diagnosis not present

## 2021-05-22 DIAGNOSIS — I10 Essential (primary) hypertension: Secondary | ICD-10-CM | POA: Diagnosis not present

## 2021-05-26 ENCOUNTER — Other Ambulatory Visit: Payer: Self-pay | Admitting: Cardiology

## 2021-05-28 DIAGNOSIS — S280XXA Crushed chest, initial encounter: Secondary | ICD-10-CM | POA: Diagnosis not present

## 2021-06-08 DIAGNOSIS — S52254A Nondisplaced comminuted fracture of shaft of ulna, right arm, initial encounter for closed fracture: Secondary | ICD-10-CM | POA: Diagnosis not present

## 2021-06-08 DIAGNOSIS — S46011A Strain of muscle(s) and tendon(s) of the rotator cuff of right shoulder, initial encounter: Secondary | ICD-10-CM | POA: Diagnosis not present

## 2021-06-20 DIAGNOSIS — I517 Cardiomegaly: Secondary | ICD-10-CM | POA: Diagnosis not present

## 2021-06-20 DIAGNOSIS — I2510 Atherosclerotic heart disease of native coronary artery without angina pectoris: Secondary | ICD-10-CM | POA: Diagnosis not present

## 2021-06-20 DIAGNOSIS — I700 Atherosclerosis of aorta: Secondary | ICD-10-CM | POA: Diagnosis not present

## 2021-06-20 DIAGNOSIS — K219 Gastro-esophageal reflux disease without esophagitis: Secondary | ICD-10-CM | POA: Diagnosis not present

## 2021-06-20 DIAGNOSIS — I491 Atrial premature depolarization: Secondary | ICD-10-CM | POA: Diagnosis not present

## 2021-06-20 DIAGNOSIS — I10 Essential (primary) hypertension: Secondary | ICD-10-CM | POA: Diagnosis not present

## 2021-06-20 DIAGNOSIS — R059 Cough, unspecified: Secondary | ICD-10-CM | POA: Diagnosis not present

## 2021-06-20 DIAGNOSIS — E876 Hypokalemia: Secondary | ICD-10-CM | POA: Diagnosis not present

## 2021-06-20 DIAGNOSIS — M4804 Spinal stenosis, thoracic region: Secondary | ICD-10-CM | POA: Diagnosis not present

## 2021-06-20 DIAGNOSIS — R9431 Abnormal electrocardiogram [ECG] [EKG]: Secondary | ICD-10-CM | POA: Diagnosis not present

## 2021-06-20 DIAGNOSIS — N3000 Acute cystitis without hematuria: Secondary | ICD-10-CM | POA: Diagnosis not present

## 2021-06-20 DIAGNOSIS — I6789 Other cerebrovascular disease: Secondary | ICD-10-CM | POA: Diagnosis not present

## 2021-06-20 DIAGNOSIS — Z882 Allergy status to sulfonamides status: Secondary | ICD-10-CM | POA: Diagnosis not present

## 2021-06-20 DIAGNOSIS — Z79899 Other long term (current) drug therapy: Secondary | ICD-10-CM | POA: Diagnosis not present

## 2021-06-20 DIAGNOSIS — R42 Dizziness and giddiness: Secondary | ICD-10-CM | POA: Diagnosis not present

## 2021-06-20 DIAGNOSIS — I77810 Thoracic aortic ectasia: Secondary | ICD-10-CM | POA: Diagnosis not present

## 2021-06-20 DIAGNOSIS — I951 Orthostatic hypotension: Secondary | ICD-10-CM | POA: Diagnosis not present

## 2021-06-20 DIAGNOSIS — R0600 Dyspnea, unspecified: Secondary | ICD-10-CM | POA: Diagnosis not present

## 2021-06-20 DIAGNOSIS — Z20822 Contact with and (suspected) exposure to covid-19: Secondary | ICD-10-CM | POA: Diagnosis not present

## 2021-06-20 DIAGNOSIS — F32A Depression, unspecified: Secondary | ICD-10-CM | POA: Diagnosis not present

## 2021-06-20 DIAGNOSIS — J439 Emphysema, unspecified: Secondary | ICD-10-CM | POA: Diagnosis not present

## 2021-06-20 DIAGNOSIS — E860 Dehydration: Secondary | ICD-10-CM | POA: Diagnosis not present

## 2021-06-25 DIAGNOSIS — N3000 Acute cystitis without hematuria: Secondary | ICD-10-CM | POA: Diagnosis not present

## 2021-06-25 DIAGNOSIS — H8113 Benign paroxysmal vertigo, bilateral: Secondary | ICD-10-CM | POA: Diagnosis not present

## 2021-07-07 ENCOUNTER — Other Ambulatory Visit: Payer: Self-pay

## 2021-07-07 ENCOUNTER — Ambulatory Visit (INDEPENDENT_AMBULATORY_CARE_PROVIDER_SITE_OTHER): Payer: Medicare Other | Admitting: Internal Medicine

## 2021-07-07 ENCOUNTER — Encounter: Payer: Self-pay | Admitting: Internal Medicine

## 2021-07-07 VITALS — BP 136/89 | HR 108 | Temp 97.7°F | Ht 65.0 in | Wt 178.2 lb

## 2021-07-07 DIAGNOSIS — K5900 Constipation, unspecified: Secondary | ICD-10-CM

## 2021-07-07 DIAGNOSIS — K219 Gastro-esophageal reflux disease without esophagitis: Secondary | ICD-10-CM

## 2021-07-07 MED ORDER — LINACLOTIDE 145 MCG PO CAPS: 145.0000 ug | ORAL_CAPSULE | Freq: Every day | ORAL | Status: DC

## 2021-07-07 MED ORDER — LINACLOTIDE 145 MCG PO CAPS: 145.0000 ug | ORAL_CAPSULE | Freq: Every day | ORAL | 11 refills | Status: DC

## 2021-07-07 NOTE — Progress Notes (Signed)
Primary Care Physician:  Neale Burly, MD Primary Gastroenterologist:  Dr. Gala Romney  Pre-Procedure History & Physical: HPI:  Michelle Zavala is a 75 y.o. female here for follow-up of constipation and GERD.  She was doing well on Linzess 145 q.OD earlier this year.  She states she has had quite a bit of trouble with constipation recently.  States she saw Dr. Zada Girt who prescribed MOM.  Interestingly, she states she did not remember taking Linzess previously.  We reconfirmed patient identifying information; I reviewed the record with her.  She states she just "forgot".  Ran out of medication.  She not had any melena or rectal bleeding.  History of colonic adenoma; due for surveillance colonoscopy next year. She continues to require Protonix 40 mg twice daily for adequate control of reflux symptoms.  No dysphagia. Past Medical History:  Diagnosis Date   Anxiety    Arthritis    C2 cervical fracture ([place]) 09/17/13   Traumatic fracture witth minimal displacement   Chronic lung disease    Fibrosis - Dr. Koleen Nimrod   COPD (chronic obstructive pulmonary disease) ([place])    Coronary atherosclerosis of native coronary artery    Mild atherosclerosis 3/10, LVEF 60-65%   Depression    Diverticulosis    Essential hypertension    GERD (gastroesophageal reflux disease)    PSVT (paroxysmal supraventricular tachycardia) ([place])     Past Surgical History:  Procedure Laterality Date   ABDOMINAL HYSTERECTOMY     ANTERIOR RELEASE VERTEBRAL BODY W/ POSTERIOR FUSION     BACTERIAL OVERGROWTH TEST N/A 02/19/2016   Procedure: BACTERIAL OVERGROWTH TEST;  Surgeon: Daneil Dolin, MD;  [facility];  Service: Endoscopy;  Laterality: N/A;  0700   BIOPSY N/A 08/04/2015   Procedure: BIOPSY;  Surgeon: Daneil Dolin, MD;  Location: [facility];  Service: Endoscopy;  Laterality: N/A;  Gastric   BIOPSY  02/21/2017   Procedure: BIOPSY;  Surgeon: Daneil Dolin, MD;  [facility];  Service: Endoscopy;;   ascending colon biopsy    BIOPSY  08/21/2020   Procedure: BIOPSY;  Surgeon: Daneil Dolin, MD;  [facility];  Service: Endoscopy;;   cataract surgery     [place]   COLONOSCOPY  June 2016   Dr. Britta Mccreedy: moderate diverticulosis in sigmoid colon, surveillance in 5 years    COLONOSCOPY WITH PROPOFOL N/A 02/21/2017   Dr. Gala Romney: diverticulosis in sigmoid colon, tubular adenomas, segmental biopsies benign. Surveillance in 5 years    ESOPHAGOGASTRODUODENOSCOPY (EGD) WITH PROPOFOL N/A 08/04/2015   Dr. Jhalil Silvera:abnormal gastric mucosa s/p biopsy. Reactive gastropathy. Negative H.pylori   ESOPHAGOGASTRODUODENOSCOPY (EGD) WITH PROPOFOL N/A 02/21/2017   Dr. Gala Romney: empiric esophageal dilatation, small hiatal hernia, otherwise normal   ESOPHAGOGASTRODUODENOSCOPY (EGD) WITH PROPOFOL N/A 08/21/2020   normal esophagus s/p dilation. Erythematous mucosa in stomach of doubtful clinical significant s/p biopsy. Negative H.pylori. Mild reactive gastropathy.    INCISIONAL HERNIA REPAIR N/A 12/03/2015   Procedure: [name] HERNIORRHAPHY WITH MESH;  Surgeon: Aviva Signs, MD;  Location: [facility];  Service: General;  Laterality: N/A;   INSERTION OF MESH  12/03/2015   Procedure: INSERTION OF MESH;  Surgeon: Aviva Signs, MD;  Location: [facility];  Service: General;;   IR RADIOLOGIST EVAL & MGMT  09/22/2017   MALONEY DILATION N/A 02/21/2017   Procedure: [name] DILATION;  Surgeon: Daneil Dolin, MD;  [facility];  Service: Endoscopy;  Laterality: N/A;   MALONEY  DILATION N/A 08/21/2020   Procedure: [name] DILATION;  Surgeon: Daneil Dolin, MD;  [facility];  Service: Endoscopy;  Laterality: N/A;   POLYPECTOMY  02/21/2017   Procedure: POLYPECTOMY;  Surgeon: Daneil Dolin, MD;  [facility];  Service: Endoscopy;;  hepatic flexure polyp cs   TMJ ARTHROSCOPY Bilateral 02/04/2015   Procedure: BILATERAL TEMPOROMANDIBULAR JOINT (TMJ) ARTHROSCOPY  MENISECTOMY WITH FAT GRAFT FROM ABDOMEN ;  Surgeon: Diona Browner, DDS;  Location: [facility];  Service: Oral Surgery;  Laterality: Bilateral;   TOTAL KNEE ARTHROPLASTY Bilateral    TUBAL LIGATION      Prior to Admission medications   Medication Sig Start Date End Date Taking? Authorizing Provider  acetaminophen (TYLENOL) 650 MG CR tablet Take 1,300 mg by mouth every 8 (eight) hours as needed for pain.    Yes [provider]  albuterol (VENTOLIN HFA) 108 (90 Base) MCG/ACT inhaler Inhale 2 puffs into the lungs every 4 (four) hours as needed for wheezing or shortness of breath.   Yes [provider]  amitriptyline (ELAVIL) 75 MG tablet Take 75 mg by mouth at bedtime. 01/27/15  Yes [provider]  Ascorbic Acid (VITAMIN C) 1000 MG tablet Take 1,000 mg by mouth daily.   Yes [provider]  Budeson-Glycopyrrol-Formoterol (BREZTRI AEROSPHERE) 160-9-4.8 MCG/ACT AERO Inhale 2 puffs into the lungs in the morning and at bedtime. 12/09/20  Yes Chesley Mires, MD  busPIRone (BUSPAR) 10 MG tablet Take 10 mg by mouth 2 (two) times daily. 01/02/21  Yes [provider]  cetirizine (ZYRTEC) 10 MG tablet Take 10 mg by mouth 2 (two) times daily. 01/21/21  Yes [provider]  cholecalciferol (VITAMIN D3) 25 MCG (1000 UNIT) tablet Take 1,000 Units by mouth daily.   Yes [provider]  hydrochlorothiazide (HYDRODIURIL) 25 MG tablet Take 25 mg by mouth every morning. 10/26/18  Yes [provider]  ipratropium (ATROVENT) 0.02 % nebulizer solution Take 0.5 mg by nebulization every 4 (four) hours as needed for wheezing or shortness of breath.   Yes [provider]  LORazepam (ATIVAN) 1 MG tablet Take 1 mg by mouth 2 (two) times daily.   Yes [provider]  magnesium hydroxide (MILK OF MAGNESIA) 400 MG/5ML suspension Take by mouth daily as needed for mild constipation.   Yes [provider]  metoprolol succinate (TOPROL-XL) 50 MG 24  hr tablet TAKE ONE TABLET BY MOUTH ONCE DAILY 05/26/21  Yes Satira Sark, MD  Multiple Vitamins-Minerals (PRESERVISION AREDS PO) Take 1 tablet by mouth in the morning and at bedtime.    Yes [provider]  Omega-3 Fatty Acids (FISH OIL) 1000 MG CAPS Take 3,000 mg by mouth daily.    Yes [provider]  pantoprazole (PROTONIX) 40 MG tablet Take 1 tablet (40 mg total) by mouth 2 (two) times daily. 09/28/13  Yes Angiulli, Lavon Paganini, PA-C  potassium chloride SA (KLOR-CON) 20 MEQ tablet Take 20 mEq by mouth daily. 04/11/19  Yes [provider]  sertraline (ZOLOFT) 100 MG tablet Take 100 mg by mouth daily.   Yes [provider]  Sodium Phosphates (ENEMA RE) Place rectally. As needed   Yes [provider]  vitamin B-12 (CYANOCOBALAMIN) 1000 MCG tablet Take 1,000 mcg by mouth daily.   Yes [provider]    Allergies as of 07/07/2021 - Review Complete 07/07/2021  Allergen Reaction Noted   Sulfonamide derivatives Hives     Family History  Problem Relation Age  of Onset   Coronary artery disease Father        Premature   Heart disease Father    Coronary artery disease Brother        Premature   Coronary artery disease Sister        Premature   Colon cancer Son     Social History   Socioeconomic History   Marital status: Widowed    Spouse name: Not on file   Number of children: 3   Years of education: 7   Highest education level: Not on file  Occupational History   Occupation: Retired  Tobacco Use   Smoking status: Never   Smokeless tobacco: Never  Vaping Use   Vaping Use: Never used  Substance and Sexual Activity   Alcohol use: No    Alcohol/week: 0.0 standard drinks   Drug use: No   Sexual activity: Never    Birth control/protection: None, Surgical  Other Topics Concern   Not on file  Social History Narrative   Occasionally drinks [employer]    Social Determinants of Health   Financial Resource Strain: Not on file  Food  Insecurity: Not on file  Transportation Needs: Not on file  Physical Activity: Not on file  Stress: Not on file  Social Connections: Not on file  Intimate Partner Violence: Not on file    Review of Systems: See HPI, otherwise negative ROS  Physical Exam: BP 136/89   Pulse (!) 108   Temp 97.7 F (36.5 C)   Ht 5\' 5"  (1.651 m)   Wt 178 lb 3.2 oz (80.8 kg)   BMI 29.65 kg/m  General:   Alert,  pleasant and cooperative in NAD Neck:  Supple; no masses or thyromegaly. No significant cervical adenopathy. Lungs:  Clear throughout to auscultation.   No wheezes, crackles, or rhonchi. No acute distress. Heart:  Regular rate and rhythm; no murmurs, clicks, rubs,  or gallops. Abdomen: Non-distended, normal bowel sounds.  Soft and nontender without appreciable mass or hepatosplenomegaly.   Impression/Plan: 75 year old lady with chronic constipation historically well controlled on the novel regimen of Linzess 145 every other day.  Along the way she came off this medication and has had recurrent constipation without any alarm features.  History of colonic adenoma; due for surveillance colonoscopy 2023  GERD well-controlled on twice daily PPI therapy.  Recommendations:  Continue Protonix 40 mg twice daily for GERD  We will her back on Linzess 145 gelcap  - take 1 every other day as needed for constipation.  Dispense 30 with 11 refills. Samples will be provided today as available  Plan for a repeat colonoscopy in 2023  Office visit with [place] in 3 months      Notice: This dictation was prepared with Dragon dictation along with smaller phrase technology. Any transcriptional errors that result from this process are unintentional and may not be corrected upon review.

## 2021-07-07 NOTE — Patient Instructions (Signed)
It was good to see you again today!  Continue Protonix 40 mg twice daily for GERD  We will get you back on Linzess 145 gelcap  - take 1 every other day as needed for constipation.  Dispense 30 with 11 refills. Samples will be provided today as available  Plan for a repeat colonoscopy in 2023  Office visit with [place] in 3 months

## 2021-07-13 DIAGNOSIS — J309 Allergic rhinitis, unspecified: Secondary | ICD-10-CM | POA: Diagnosis not present

## 2021-07-13 DIAGNOSIS — K580 Irritable bowel syndrome with diarrhea: Secondary | ICD-10-CM | POA: Diagnosis not present

## 2021-07-14 ENCOUNTER — Telehealth: Payer: Self-pay | Admitting: Internal Medicine

## 2021-07-14 NOTE — Telephone Encounter (Signed)
Patient called and said that she wanted to go to dr Otelia Limes office because she wasn't treated well here.  Said all she was given was a laxative. I cancelled her upcoming appointment in march

## 2021-07-20 DIAGNOSIS — I209 Angina pectoris, unspecified: Secondary | ICD-10-CM | POA: Diagnosis not present

## 2021-07-28 DIAGNOSIS — I209 Angina pectoris, unspecified: Secondary | ICD-10-CM | POA: Diagnosis not present

## 2021-07-28 DIAGNOSIS — R9439 Abnormal result of other cardiovascular function study: Secondary | ICD-10-CM | POA: Diagnosis not present

## 2021-07-30 DIAGNOSIS — S52254K Nondisplaced comminuted fracture of shaft of ulna, right arm, subsequent encounter for closed fracture with nonunion: Secondary | ICD-10-CM | POA: Diagnosis not present

## 2021-07-30 DIAGNOSIS — S7002XA Contusion of left hip, initial encounter: Secondary | ICD-10-CM | POA: Diagnosis not present

## 2021-07-30 DIAGNOSIS — S335XXA Sprain of ligaments of lumbar spine, initial encounter: Secondary | ICD-10-CM | POA: Diagnosis not present

## 2021-07-31 DIAGNOSIS — I517 Cardiomegaly: Secondary | ICD-10-CM | POA: Diagnosis not present

## 2021-07-31 DIAGNOSIS — I10 Essential (primary) hypertension: Secondary | ICD-10-CM | POA: Diagnosis not present

## 2021-07-31 DIAGNOSIS — I440 Atrioventricular block, first degree: Secondary | ICD-10-CM | POA: Diagnosis not present

## 2021-07-31 DIAGNOSIS — J439 Emphysema, unspecified: Secondary | ICD-10-CM | POA: Diagnosis not present

## 2021-07-31 DIAGNOSIS — M4854XA Collapsed vertebra, not elsewhere classified, thoracic region, initial encounter for fracture: Secondary | ICD-10-CM | POA: Diagnosis not present

## 2021-07-31 DIAGNOSIS — I444 Left anterior fascicular block: Secondary | ICD-10-CM | POA: Diagnosis not present

## 2021-07-31 DIAGNOSIS — I498 Other specified cardiac arrhythmias: Secondary | ICD-10-CM | POA: Diagnosis not present

## 2021-07-31 DIAGNOSIS — R079 Chest pain, unspecified: Secondary | ICD-10-CM | POA: Diagnosis not present

## 2021-07-31 DIAGNOSIS — R0789 Other chest pain: Secondary | ICD-10-CM | POA: Diagnosis not present

## 2021-08-04 ENCOUNTER — Encounter: Payer: Self-pay | Admitting: *Deleted

## 2021-08-10 ENCOUNTER — Ambulatory Visit (INDEPENDENT_AMBULATORY_CARE_PROVIDER_SITE_OTHER): Payer: Medicare Other | Admitting: Cardiology

## 2021-08-10 ENCOUNTER — Encounter: Payer: Self-pay | Admitting: Cardiology

## 2021-08-10 VITALS — BP 134/82 | HR 88 | Ht 63.0 in | Wt 171.2 lb

## 2021-08-10 DIAGNOSIS — I471 Supraventricular tachycardia: Secondary | ICD-10-CM

## 2021-08-10 DIAGNOSIS — I209 Angina pectoris, unspecified: Secondary | ICD-10-CM | POA: Diagnosis not present

## 2021-08-10 DIAGNOSIS — I7121 Aneurysm of the ascending aorta, without rupture: Secondary | ICD-10-CM

## 2021-08-10 DIAGNOSIS — D692 Other nonthrombocytopenic purpura: Secondary | ICD-10-CM | POA: Diagnosis not present

## 2021-08-10 DIAGNOSIS — R9439 Abnormal result of other cardiovascular function study: Secondary | ICD-10-CM

## 2021-08-10 DIAGNOSIS — R0602 Shortness of breath: Secondary | ICD-10-CM

## 2021-08-10 DIAGNOSIS — E782 Mixed hyperlipidemia: Secondary | ICD-10-CM

## 2021-08-10 DIAGNOSIS — K219 Gastro-esophageal reflux disease without esophagitis: Secondary | ICD-10-CM | POA: Diagnosis not present

## 2021-08-10 MED ORDER — ASPIRIN EC 81 MG PO TBEC: 81.0000 mg | DELAYED_RELEASE_TABLET | Freq: Every day | ORAL | Status: DC

## 2021-08-10 MED ORDER — ISOSORBIDE MONONITRATE ER 30 MG PO TB24: 15.0000 mg | ORAL_TABLET | Freq: Every day | ORAL | 6 refills | Status: DC

## 2021-08-10 MED ORDER — ISOSORBIDE MONONITRATE ER 30 MG PO TB24: 30.0000 mg | ORAL_TABLET | Freq: Every day | ORAL | 6 refills | Status: DC

## 2021-08-10 MED ORDER — ATORVASTATIN CALCIUM 40 MG PO TABS: 40.0000 mg | ORAL_TABLET | Freq: Every day | ORAL | 6 refills | Status: DC

## 2021-08-10 MED ORDER — METOPROLOL SUCCINATE ER 25 MG PO TB24: 25.0000 mg | ORAL_TABLET | Freq: Every day | ORAL | 6 refills | Status: DC

## 2021-08-10 NOTE — Patient Instructions (Addendum)
Medication Instructions:  Begin Aspirin 81mg  daily Increase Toprol to 75mg  daily Begin Imdur 15mg  every evening Begin Lipitor 40mg  daily  Continue all other medications.     Labwork: none  Testing/Procedures: Your physician has requested that you have an echocardiogram. Echocardiography is a painless test that uses sound waves to create images of your heart. It provides your doctor with information about the size and shape of your heart and how well your heart's chambers and valves are working. This procedure takes approximately one hour. There are no restrictions for this procedure. Office will contact with results via phone or letter.     Follow-Up: 4-6 weeks   Any Other Special Instructions Will Be Listed Below (If Applicable).   If you need a refill on your cardiac medications before your next appointment, please call your pharmacy.

## 2021-08-10 NOTE — Progress Notes (Signed)
Cardiology Office Note  Date: 08/10/2021   ID: Michelle Zavala, DOB 1945-10-13, MRN [id number]  PCP:  Neale Burly, MD  Cardiologist:  Rozann Lesches, MD Electrophysiologist:  None   Chief Complaint  Patient presents with   Cardiac follow-up    History of Present Illness: Michelle Zavala is a 75 y.o. female last seen in September by Mr. Leonides Sake NP.  I last saw her in January 2021.  Interval records reviewed including recent testing at [facility] as noted below.  She tells me that she had a fall when she was outside working in her flower bed, fell forward on her chest.  She has been sore since that time, but also describes a recurring left lower costal discomfort when she exerts herself.  Chronic dyspnea on exertion NYHA class II-III.  No sense of palpitations.  Recent Myoview described fixed defects in the apex, mid inferolateral, and apical lateral wall with LVEF 41%.  Her last echocardiogram in November 2021 revealed LVEF 50 to 55% without regional wall motion abnormalities.  I discussed this with her today.  She has a previous history of mild coronary atherosclerosis as of 2010.  Recent chest CT imaging demonstrated multivessel coronary artery calcification along with documentation of a 4.2 cm ascending thoracic aneurysm.  Current symptoms are somewhat mixed, but we discussed treatment for ischemic heart disease and possibly further testing such as repeat cardiac catheterization depending on how she does.  Interval echocardiogram will also be ordered to better evaluate LVEF and whether there has been a change since last year.  Past Medical History:  Diagnosis Date   Anxiety    Arthritis    C2 cervical fracture ([place]) 09/17/13   Traumatic fracture witth minimal displacement   Chronic lung disease    Fibrosis - Dr. Koleen Nimrod   COPD (chronic obstructive pulmonary disease) ([place])    Coronary atherosclerosis of native coronary artery    Mild atherosclerosis 3/10, LVEF 60-65%    Depression    Diverticulosis    Essential hypertension    GERD (gastroesophageal reflux disease)    PSVT (paroxysmal supraventricular tachycardia) ([place])     Past Surgical History:  Procedure Laterality Date   ABDOMINAL HYSTERECTOMY     ANTERIOR RELEASE VERTEBRAL BODY W/ POSTERIOR FUSION     BACTERIAL OVERGROWTH TEST N/A 02/19/2016   Procedure: BACTERIAL OVERGROWTH TEST;  Surgeon: Daneil Dolin, MD;  [facility];  Service: Endoscopy;  Laterality: N/A;  0700   BIOPSY N/A 08/04/2015   Procedure: BIOPSY;  Surgeon: Daneil Dolin, MD;  Location: [facility];  Service: Endoscopy;  Laterality: N/A;  Gastric   BIOPSY  02/21/2017   Procedure: BIOPSY;  Surgeon: Daneil Dolin, MD;  [facility];  Service: Endoscopy;;  ascending colon biopsy    BIOPSY  08/21/2020   Procedure: BIOPSY;  Surgeon: Daneil Dolin, MD;  [facility];  Service: Endoscopy;;   cataract surgery     [place]   COLONOSCOPY  June 2016   Dr. Britta Mccreedy: moderate diverticulosis in sigmoid colon, surveillance in 5 years    COLONOSCOPY WITH PROPOFOL N/A 02/21/2017   Dr. Gala Romney: diverticulosis in sigmoid colon, tubular adenomas, segmental biopsies benign. Surveillance in 5 years    ESOPHAGOGASTRODUODENOSCOPY (EGD) WITH PROPOFOL N/A 08/04/2015   Dr. Rourk:abnormal gastric mucosa s/p biopsy. Reactive gastropathy. Negative H.pylori   ESOPHAGOGASTRODUODENOSCOPY (EGD) WITH PROPOFOL N/A 02/21/2017   Dr. Gala Romney: empiric esophageal dilatation, small hiatal  hernia, otherwise normal   ESOPHAGOGASTRODUODENOSCOPY (EGD) WITH PROPOFOL N/A 08/21/2020   normal esophagus s/p dilation. Erythematous mucosa in stomach of doubtful clinical significant s/p biopsy. Negative H.pylori. Mild reactive gastropathy.    INCISIONAL HERNIA REPAIR N/A 12/03/2015   Procedure: [name] HERNIORRHAPHY WITH MESH;  Surgeon: Aviva Signs, MD;  Location: [facility];  Service: General;  Laterality: N/A;   INSERTION OF  MESH  12/03/2015   Procedure: INSERTION OF MESH;  Surgeon: Aviva Signs, MD;  Location: [facility];  Service: General;;   IR RADIOLOGIST EVAL & MGMT  09/22/2017   MALONEY DILATION N/A 02/21/2017   Procedure: [name] DILATION;  Surgeon: Daneil Dolin, MD;  [facility];  Service: Endoscopy;  Laterality: N/A;   MALONEY DILATION N/A 08/21/2020   Procedure: [name] DILATION;  Surgeon: Daneil Dolin, MD;  [facility];  Service: Endoscopy;  Laterality: N/A;   POLYPECTOMY  02/21/2017   Procedure: POLYPECTOMY;  Surgeon: Daneil Dolin, MD;  [facility];  Service: Endoscopy;;  hepatic flexure polyp cs   TMJ ARTHROSCOPY Bilateral 02/04/2015   Procedure: BILATERAL TEMPOROMANDIBULAR JOINT (TMJ) ARTHROSCOPY MENISECTOMY WITH FAT GRAFT FROM ABDOMEN ;  Surgeon: Diona Browner, DDS;  Location: [place];  Service: Oral Surgery;  Laterality: Bilateral;   TOTAL KNEE ARTHROPLASTY Bilateral    TUBAL LIGATION      Current Outpatient Medications  Medication Sig Dispense Refill   acetaminophen (TYLENOL) 650 MG CR tablet Take 1,300 mg by mouth every 8 (eight) hours as needed for pain.      albuterol (VENTOLIN HFA) 108 (90 Base) MCG/ACT inhaler Inhale 2 puffs into the lungs every 4 (four) hours as needed for wheezing or shortness of breath.     amitriptyline (ELAVIL) 75 MG tablet Take 75 mg by mouth at bedtime.  6   Ascorbic Acid (VITAMIN C) 1000 MG tablet Take 1,000 mg by mouth daily.     aspirin EC 81 MG tablet Take 1 tablet (81 mg total) by mouth daily. Swallow whole.     atorvastatin (LIPITOR) 40 MG tablet Take 1 tablet (40 mg total) by mouth daily. 30 tablet 6   Budeson-Glycopyrrol-Formoterol (BREZTRI AEROSPHERE) 160-9-4.8 MCG/ACT AERO Inhale 2 puffs into the lungs in the morning and at bedtime. 10.7 g 0   cholecalciferol (VITAMIN D3) 25 MCG (1000 UNIT) tablet Take 1,000 Units by mouth daily.     hydrochlorothiazide (HYDRODIURIL) 25 MG tablet Take 25 mg by mouth every morning.      ipratropium (ATROVENT) 0.02 % nebulizer solution Take 0.5 mg by nebulization every 4 (four) hours as needed for wheezing or shortness of breath.     LORazepam (ATIVAN) 1 MG tablet Take 1 mg by mouth 2 (two) times daily.     magnesium hydroxide (MILK OF MAGNESIA) 400 MG/5ML suspension Take by mouth daily as needed for mild constipation.     metoprolol succinate (TOPROL XL) 25 MG 24 hr tablet Take 1 tablet (25 mg total) by mouth daily. (WILL TAKE IN ADDITION TO THE 50MG TABLET) 30 tablet 6   metoprolol succinate (TOPROL-XL) 50 MG 24 hr tablet TAKE ONE TABLET BY MOUTH ONCE DAILY 90 tablet 1   Multiple Vitamins-Minerals (PRESERVISION AREDS PO) Take 1 tablet by mouth in the morning and at bedtime.      Omega-3 Fatty Acids (FISH OIL) 1000 MG CAPS Take 3,000 mg by mouth daily.      pantoprazole (PROTONIX) 40 MG tablet Take 1 tablet (40 mg total) by mouth 2 (two)  times daily. 60 tablet 1   potassium chloride SA (KLOR-CON) 20 MEQ tablet Take 20 mEq by mouth daily.     sertraline (ZOLOFT) 100 MG tablet Take 100 mg by mouth daily.     Sodium Phosphates (ENEMA RE) Place rectally. As needed     vitamin B-12 (CYANOCOBALAMIN) 1000 MCG tablet Take 1,000 mcg by mouth daily.     isosorbide mononitrate (IMDUR) 30 MG 24 hr tablet Take 0.5 tablets (15 mg total) by mouth daily. 15 tablet 6   No current facility-administered medications for this visit.   Allergies:  Sulfonamide derivatives   Social History: The patient  reports that she has never smoked. She has never used smokeless tobacco. She reports that she does not drink alcohol and does not use drugs.   Family History: The patient's family history includes Colon cancer in her son; Coronary artery disease in her brother, father, and sister; Heart disease in her father.   ROS: No palpitations or syncope.  Physical Exam: VS:  BP 134/82   Pulse 88   Ht 5' 3"  (1.6 m)   Wt 171 lb 3.2 oz (77.7 kg)   SpO2 96%   BMI 30.33 kg/m , BMI Body mass index is 30.33  kg/m.  Wt Readings from Last 3 Encounters:  08/10/21 171 lb 3.2 oz (77.7 kg)  07/07/21 178 lb 3.2 oz (80.8 kg)  02/02/21 181 lb 3.2 oz (82.2 kg)    General: Patient appears comfortable at rest. HEENT: Conjunctiva and lids normal, wearing a mask. Neck: Supple, no elevated JVP or carotid bruits, no thyromegaly. Lungs: Clear to auscultation, nonlabored breathing at rest. Cardiac: Regular rate and rhythm with ectopy, no S3 or significant systolic murmur, no pericardial rub. Abdomen: Soft, nontender, bowel sounds present. Extremities: No pitting edema, distal pulses 2+. Skin: Warm and dry. Musculoskeletal: No kyphosis. Neuropsychiatric: Alert and oriented x3, affect grossly appropriate.  ECG:  An ECG dated 07/31/2021 was personally reviewed today and demonstrated:  Sinus rhythm with prolonged PR interval, left anterior fascicular block, LVH, and PVC.  Recent Labwork:    Component Value Date/Time   CHOL 223 (H) 09/23/2020 1454   TRIG 220 (H) 09/23/2020 1454   HDL 57 09/23/2020 1454   CHOLHDL 3.9 09/23/2020 1454   LDLCALC 129 (H) 09/23/2020 1454  November 2022: Hemoglobin 12.4, platelets 225, high-sensitivity troponin I 11, ESR 8, potassium 3.2, BUN 10, creatinine 0.91, COVID-19 negative  Other Studies Reviewed Today:  Echocardiogram 07/24/2020:  1. Left ventricular ejection fraction, by estimation, is 50 to 55%. The  left ventricle has low normal function. The left ventricle has no regional  wall motion abnormalities. There is mild left ventricular hypertrophy.  Left ventricular diastolic  parameters are indeterminate.   2. Right ventricular systolic function is normal. The right ventricular  size is normal. Tricuspid regurgitation signal is inadequate for assessing  PA pressure.   3. The mitral valve is grossly normal. Trivial mitral valve  regurgitation.   4. The aortic valve is tricuspid. Aortic valve regurgitation is mild.  Aortic regurgitation PHT measures 670 msec.   5.  Aortic dilatation noted. There is moderate dilatation of the ascending  aorta, measuring 44 mm.   6. The inferior vena cava is normal in size with greater than 50%  respiratory variability, suggesting right atrial pressure of 3 mmHg.   Chest CTA 06/20/2021 [facility]): IMPRESSION:  No pulmonary embolism.   Extensive multi-vessel coronary artery calcification. Mild global  cardiomegaly.   Dilation of the thoracic aorta  with maximal dimension of 4.2 cm  within the ascending segment. Recommend annual imaging followup by  CTA or MRA. This recommendation follows 2010  ACCF/AHA/AATS/ACR/ASA/SCA/SCAI/SIR/STS/SVM Guidelines for the  Diagnosis and Management of Patients with Thoracic Aortic Disease.  Circulation. 2010; 121: X324-M010. 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   Aortic Atherosclerosis (ICD10-I70.0).   Chest x-ray 07/31/2021 [facility]): FINDINGS:  Portable AP upright view at 0857 hours. Stable tortuosity of the  thoracic aorta. Stable borderline to mild cardiomegaly. Stable lung  volumes. Allowing for portable technique the lungs are clear.  Visualized tracheal air column is within normal limits. No  pneumothorax or pleural effusion.   Prior anterior and posterior cervical spine fusion. Mild thoracic  scoliosis. Negative visible bowel gas.   IMPRESSION:  No acute cardiopulmonary abnormality.   Lexiscan Myoview 07/28/2021 [facility]): 1. Small fixed defects identified within the apical, mid  inferolateral, and apicolateral segments..   2. Decreased wall motion and thickening of the apex.   3. Left ventricular ejection fraction 41%   4. Non invasive risk stratification*: Intermediate   Assessment and Plan:  1.  Chest pain with typical and atypical features as discussed above.  She has evidence of probable development of ischemic heart disease  based on recent abnormal Myoview at [facility] and recently documented multivessel coronary artery calcification by CT imaging.  She had only mild coronary atherosclerosis as of 2010 and a low risk Myoview through our practice last year.  We will obtain an echocardiogram to follow-up on whether there has been an actual change in LVEF to suggest ischemic cardiomyopathy.  Start aspirin 81 mg daily, increase Toprol-XL to 50 mg in the morning and 25 mg in the evening, start Imdur 15 mg once in the evening, start Lipitor 40 mg daily.  We will bring her back to the office for review reevaluation.  May ultimately need a right and left heart catheterization however.  2.  Hyperlipidemia, LDL 129 in January.  Lipitor 40 mg daily being started.  3.  History of PSVT, no complaint of palpitations at this time.  4.  Ascending aortic aneurysm 4.2 cm, asymptomatic.  Medication Adjustments/Labs and Tests Ordered: Current medicines are reviewed at length with the patient today.  Concerns regarding medicines are outlined above.   Tests Ordered: Orders Placed This Encounter  Procedures   ECHOCARDIOGRAM COMPLETE     Medication Changes: Meds ordered this encounter  Medications   aspirin EC 81 MG tablet    Sig: Take 1 tablet (81 mg total) by mouth daily. Swallow whole.    New 08/10/2021   DISCONTD: isosorbide mononitrate (IMDUR) 30 MG 24 hr tablet    Sig: Take 1 tablet (30 mg total) by mouth daily.    Dispense:  15 tablet    Refill:  6    New 08/10/2021   atorvastatin (LIPITOR) 40 MG tablet    Sig: Take 1 tablet (40 mg total) by mouth daily.    Dispense:  30 tablet    Refill:  6    New 08/10/2021   metoprolol succinate (TOPROL XL) 25 MG 24 hr tablet    Sig: Take 1 tablet (25 mg total) by mouth daily. (WILL TAKE IN ADDITION TO THE 50MG TABLET)    Dispense:  30 tablet    Refill:  6    New 08/10/2021 -   isosorbide mononitrate (IMDUR) 30 MG 24 hr tablet  Sig: Take 0.5 tablets (15 mg total) by  mouth daily.    Dispense:  15 tablet    Refill:  6    New 08/10/2021     Disposition:  Follow up  4 to 6 weeks.  Signed, Satira Sark, MD, [facility] 08/10/2021 1:53 PM    [place] [facility] [place], [place], [place] [address] Phone: [phone]; Fax: [phone]

## 2021-08-17 ENCOUNTER — Ambulatory Visit (INDEPENDENT_AMBULATORY_CARE_PROVIDER_SITE_OTHER): Payer: Medicare Other

## 2021-08-17 ENCOUNTER — Other Ambulatory Visit: Payer: Self-pay

## 2021-08-17 ENCOUNTER — Telehealth: Payer: Self-pay | Admitting: *Deleted

## 2021-08-17 DIAGNOSIS — R0602 Shortness of breath: Secondary | ICD-10-CM | POA: Diagnosis not present

## 2021-08-17 LAB — ECHOCARDIOGRAM COMPLETE
Area-P 1/2: 2.5 cm2
Calc EF: 44.3 %
P 1/2 time: 772 msec
S' Lateral: 3.86 cm
Single Plane A2C EF: 37.1 %
Single Plane A4C EF: 50.9 %

## 2021-08-17 NOTE — Telephone Encounter (Signed)
Came to office today for echo and reported to vascular tech that she stopped the new medications (lipitor & imdur) she was recently prescribed due to it causing her to have migraines and dizziness.

## 2021-08-17 NOTE — Telephone Encounter (Signed)
Spoke with patient and encouraged to restart lipitor taking it at bedtime. Refused and says she will not restart it. Advised that imdur does cause headaches and that she can take tylenol for headache and it should improve in several days. Advised she could also take imdur 15 mg every other day. Refused and says she will not restart either medication.

## 2021-08-19 ENCOUNTER — Telehealth: Payer: Self-pay | Admitting: *Deleted

## 2021-08-19 NOTE — Telephone Encounter (Signed)
-----   Message from Satira Sark, MD sent at 08/17/2021  6:40 PM EST ----- Results reviewed.  Echocardiogram is in line with recent Myoview at [facility], 35 to 40% range which represents a decrease compared to prior assessment and presence of underlying ischemic heart disease.  Please make sure that she has office follow-up as per recent note, I think we are going to have to discuss a diagnostic cardiac catheterization.

## 2021-08-19 NOTE — Telephone Encounter (Signed)
Patient informed and verbalized understanding of plan. Copy sent to PCP 

## 2021-09-09 NOTE — Progress Notes (Signed)
Cardiology Office Note    Date:  09/10/2021   ID:  Michelle Zavala, DOB 02-01-1946, MRN [id number]  PCP:  Michelle Burly, MD  Cardiologist: Michelle Lesches, MD    Chief Complaint  Patient presents with   Follow-up    6 week visit; Discuss echo results    History of Present Illness:    Michelle Zavala is a 75 y.o. female with past medical history of CAD (mild plaque by catheterization in 2010), pSVT, HTN, HLD, ascending thoracic aortic aneurysm (at 4.2 cm by CT in 06/2021), diverticulosis, GERD and COPD who presents to the office today for 6-week follow-up.   She was last examined by Dr. Domenic Zavala in 07/2021 and had recently suffered a fall and landed on her chest and her chest had been sore since but she also reported exertional chest pain as well. Recent NST at [facility] had shown fixed defects in the apex, mid inferolateral and apical lateral walls with EF estimated at 41%. A follow-up echocardiogram was recommended and it was discussed she may require R/LHC pending results. She was started on ASA 81mg  daily, Imdur 15mg  daily and Lipitor 40mg  daily along with Toprol-XL being increased to 50mg  in AM/25mg  in PM.   She did call the office in 08/2021 reporting headaches and dizziness with Imdur and Lipitor, therefore she discontinued both. Follow-up echo did show her EF was reduced at 35-40% with WMA present including the apical septal segment, apical inferior segment and apex being akinetic. Also had mildly reduced RV function, trivial MR and mild to moderate AI.  In talking with the patient today, she reports the exertional chest pain she was previously experiencing has improved but she continues to have an aching sensation under her left breast which she feels is due to a fall she experienced in 07/2021. Reports having dyspnea on exertion but says she is not overly active at baseline and is mostly sedentary. No reported orthopnea, PND or lower extremity edema. Says she did stop Imdur, Lipitor  and ASA as she felt awful on the day that she took all of the medications at once. Also reduced her Toprol-XL back to her prior dose of 50 mg daily.  Past Medical History:  Diagnosis Date   Anxiety    Arthritis    C2 cervical fracture ([place]) 09/17/2013   Traumatic fracture witth minimal displacement   Cardiomyopathy ([place])    a. EF 35-40% by echo in 08/2021   Chronic lung disease    Fibrosis - Michelle Zavala   COPD (chronic obstructive pulmonary disease) ([place])    Coronary atherosclerosis of native coronary artery    a. Mild atherosclerosis by cath in 2010 b. NST in 07/2021 showing fixed defects but EF at 41% by NST and 35-40% by echo   Depression    Diverticulosis    Essential hypertension    GERD (gastroesophageal reflux disease)    PSVT (paroxysmal supraventricular tachycardia) ([place])     Past Surgical History:  Procedure Laterality Date   ABDOMINAL HYSTERECTOMY     ANTERIOR RELEASE VERTEBRAL BODY W/ POSTERIOR FUSION     BACTERIAL OVERGROWTH TEST N/A 02/19/2016   Procedure: BACTERIAL OVERGROWTH TEST;  Surgeon: Michelle Dolin, MD;  [facility];  Service: Endoscopy;  Laterality: N/A;  0700   BIOPSY N/A 08/04/2015   Procedure: BIOPSY;  Surgeon: Michelle Dolin, MD;  Location: [facility];  Service: Endoscopy;  Laterality: N/A;  Gastric   BIOPSY  02/21/2017   Procedure: BIOPSY;  Surgeon:  Michelle Dolin, MD;  [facility];  Service: Endoscopy;;  ascending colon biopsy    BIOPSY  08/21/2020   Procedure: BIOPSY;  Surgeon: Michelle Dolin, MD;  [facility];  Service: Endoscopy;;   cataract surgery     [place]   COLONOSCOPY  June 2016   Dr. Britta Zavala: moderate diverticulosis in sigmoid colon, surveillance in 5 years    COLONOSCOPY WITH PROPOFOL N/A 02/21/2017   Dr. Gala Zavala: diverticulosis in sigmoid colon, tubular adenomas, segmental biopsies benign. Surveillance in 5 years    ESOPHAGOGASTRODUODENOSCOPY (EGD) WITH PROPOFOL N/A  08/04/2015   Dr. Rourk:abnormal gastric mucosa s/p biopsy. Reactive gastropathy. Negative H.pylori   ESOPHAGOGASTRODUODENOSCOPY (EGD) WITH PROPOFOL N/A 02/21/2017   Dr. Gala Zavala: empiric esophageal dilatation, small hiatal hernia, otherwise normal   ESOPHAGOGASTRODUODENOSCOPY (EGD) WITH PROPOFOL N/A 08/21/2020   normal esophagus s/p dilation. Erythematous mucosa in stomach of doubtful clinical significant s/p biopsy. Negative H.pylori. Mild reactive gastropathy.    INCISIONAL HERNIA REPAIR N/A 12/03/2015   Procedure: Michelle Zavala HERNIORRHAPHY WITH MESH;  Surgeon: Aviva Signs, MD;  Location: [facility];  Service: General;  Laterality: N/A;   INSERTION OF MESH  12/03/2015   Procedure: INSERTION OF MESH;  Surgeon: Aviva Signs, MD;  Location: [facility];  Service: General;;   IR RADIOLOGIST EVAL & MGMT  09/22/2017   MALONEY DILATION N/A 02/21/2017   Procedure: Michelle Zavala DILATION;  Surgeon: Michelle Dolin, MD;  [facility];  Service: Endoscopy;  Laterality: N/A;   MALONEY DILATION N/A 08/21/2020   Procedure: Michelle Zavala DILATION;  Surgeon: Michelle Dolin, MD;  [facility];  Service: Endoscopy;  Laterality: N/A;   POLYPECTOMY  02/21/2017   Procedure: POLYPECTOMY;  Surgeon: Michelle Dolin, MD;  [facility];  Service: Endoscopy;;  hepatic flexure polyp cs   TMJ ARTHROSCOPY Bilateral 02/04/2015   Procedure: BILATERAL TEMPOROMANDIBULAR JOINT (TMJ) ARTHROSCOPY MENISECTOMY WITH FAT GRAFT FROM ABDOMEN ;  Surgeon: Michelle Zavala, DDS;  Location: [facility];  Service: Oral Surgery;  Laterality: Bilateral;   TOTAL KNEE ARTHROPLASTY Bilateral    TUBAL LIGATION      Current Medications: Outpatient Medications Prior to Visit  Medication Sig Dispense Refill   acetaminophen (TYLENOL) 650 MG CR tablet Take 1,300 mg by mouth every 8 (eight) hours as needed for pain.      albuterol (VENTOLIN HFA) 108 (90 Base) MCG/ACT inhaler Inhale 2 puffs into the lungs every 4 (four) hours as needed for wheezing or  shortness of breath.     amitriptyline (ELAVIL) 75 MG tablet Take 75 mg by mouth at bedtime.  6   Ascorbic Acid (VITAMIN C) 1000 MG tablet Take 1,000 mg by mouth daily.     aspirin EC 81 MG tablet Take 1 tablet (81 mg total) by mouth daily. Swallow whole.     Budeson-Glycopyrrol-Formoterol (BREZTRI AEROSPHERE) 160-9-4.8 MCG/ACT AERO Inhale 2 puffs into the lungs in the morning and at bedtime. 10.7 g 0   cholecalciferol (VITAMIN D3) 25 MCG (1000 UNIT) tablet Take 1,000 Units by mouth daily.     hydrochlorothiazide (HYDRODIURIL) 25 MG tablet Take 25 mg by mouth every morning.     ipratropium (ATROVENT) 0.02 % nebulizer solution Take 0.5 mg by nebulization every 4 (four) hours as needed for wheezing or shortness of breath.     LORazepam (ATIVAN) 1 MG tablet Take 1 mg by mouth 2 (two) times daily.     magnesium hydroxide (MILK  OF MAGNESIA) 400 MG/5ML suspension Take by mouth daily as needed for mild constipation.     metoprolol succinate (TOPROL-XL) 50 MG 24 hr tablet TAKE ONE TABLET BY MOUTH ONCE DAILY 90 tablet 1   Multiple Vitamins-Minerals (PRESERVISION AREDS PO) Take 1 tablet by mouth in the morning and at bedtime.      Omega-3 Fatty Acids (FISH OIL) 1000 MG CAPS Take 3,000 mg by mouth daily.      pantoprazole (PROTONIX) 40 MG tablet Take 1 tablet (40 mg total) by mouth 2 (two) times daily. 60 tablet 1   potassium chloride SA (KLOR-CON) 20 MEQ tablet Take 20 mEq by mouth daily.     sertraline (ZOLOFT) 100 MG tablet Take 100 mg by mouth daily.     Sodium Phosphates (ENEMA RE) Place rectally. As needed     vitamin B-12 (CYANOCOBALAMIN) 1000 MCG tablet Take 1,000 mcg by mouth daily.     atorvastatin (LIPITOR) 40 MG tablet Take 1 tablet (40 mg total) by mouth daily. (Patient not taking: Reported on 09/10/2021) 30 tablet 6   isosorbide mononitrate (IMDUR) 30 MG 24 hr tablet Take 0.5 tablets (15 mg total) by mouth daily. (Patient not taking: Reported on 09/10/2021) 15 tablet 6   metoprolol succinate  (TOPROL XL) 25 MG 24 hr tablet Take 1 tablet (25 mg total) by mouth daily. (WILL TAKE IN ADDITION TO THE 50MG  TABLET) (Patient not taking: Reported on 09/10/2021) 30 tablet 6   No facility-administered medications prior to visit.     Allergies:   Sulfonamide derivatives   Social History   Socioeconomic History   Marital status: Widowed    Spouse name: Not on file   Number of children: 3   Years of education: 7   Highest education level: Not on file  Occupational History   Occupation: Retired  Tobacco Use   Smoking status: Never   Smokeless tobacco: Never  Vaping Use   Vaping Use: Never used  Substance and Sexual Activity   Alcohol use: No    Alcohol/week: 0.0 standard drinks   Drug use: No   Sexual activity: Never    Birth control/protection: None, Surgical  Other Topics Concern   Not on file  Social History Narrative   Occasionally drinks [employer]    Social Determinants of Health   Financial Resource Strain: Not on file  Food Insecurity: Not on file  Transportation Needs: Not on file  Physical Activity: Not on file  Stress: Not on file  Social Connections: Not on file     Family History:  The patient's family history includes Colon cancer in her son; Coronary artery disease in her brother, father, and sister; Heart disease in her father.   Review of Systems:    Please see the history of present illness.     All other systems reviewed and are otherwise negative except as noted above.   Physical Exam:    VS:  BP 128/84    Pulse 94    Ht 5\' 3"  (1.6 m)    Wt 174 lb (78.9 kg)    SpO2 95%    BMI 30.82 kg/m    General: Well developed, well nourished,female appearing in no acute distress. Head: Normocephalic, atraumatic. Neck: No carotid bruits. JVD not elevated.  Lungs: Respirations regular and unlabored, without wheezes or rales.  Heart: Regular rate and rhythm. No S3 or S4.  No murmur, no rubs, or gallops appreciated. Abdomen: Appears non-distended. No obvious  abdominal masses. Msk:  Strength and  tone appear normal for age. No obvious joint deformities or effusions. Extremities: No clubbing or cyanosis. No pitting edema.  Distal pedal pulses are 2+ bilaterally. Neuro: Alert and oriented X 3. Moves all extremities spontaneously. No focal deficits noted. Psych:  Responds to questions appropriately with a normal affect. Skin: No rashes or lesions noted  Wt Readings from Last 3 Encounters:  09/10/21 174 lb (78.9 kg)  08/10/21 171 lb 3.2 oz (77.7 kg)  07/07/21 178 lb 3.2 oz (80.8 kg)     Studies/Labs Reviewed:   EKG:  EKG is not ordered today.   Recent Labs: 09/10/2021: BUN 13; Creatinine, Ser 0.85; Hemoglobin 14.0; Platelets 249; Potassium 3.1; Sodium 136   Lipid Panel    Component Value Date/Time   CHOL 223 (H) 09/23/2020 1454   TRIG 220 (H) 09/23/2020 1454   HDL 57 09/23/2020 1454   CHOLHDL 3.9 09/23/2020 1454   LDLCALC 129 (H) 09/23/2020 1454    Additional studies/ records that were reviewed today include:   NST: 07/2021 1. Small fixed defects identified within the apical, mid  inferolateral, and apicolateral segments..   2. Decreased wall motion and thickening of the apex.   3. Left ventricular ejection fraction 41%   4. Non invasive risk stratification*: Intermediate   Echocardiogram: 08/2021 IMPRESSIONS     1. Left ventricular ejection fraction, by estimation, is 35 to 40%. The  left ventricle has moderately decreased function. The left ventricle  demonstrates regional wall motion abnormalities (see scoring  diagram/findings for description). Left ventricular   diastolic parameters are consistent with Grade I diastolic dysfunction  (impaired relaxation). Abnormal global longitudinal strain of -9.8%.   2. Right ventricular systolic function is mildly reduced. The right  ventricular size is normal. There is normal pulmonary artery systolic  pressure. The estimated right ventricular systolic pressure is 84.6 mmHg.   3.  Left atrial size was moderately dilated.   4. The mitral valve is abnormal. Trivial mitral valve regurgitation.   5. The aortic valve is tricuspid. Aortic valve regurgitation is mild to  moderate. Aortic regurgitation PHT measures 772 msec.   6. Aortic dilatation noted. There is moderate dilatation of the ascending  aorta, measuring 44 mm.   7. The inferior vena cava is normal in size with greater than 50%  respiratory variability, suggesting right atrial pressure of 3 mmHg.   Comparison(s): Prior images reviewed side by side. LVEF has decreased with  wall motion abnormalties suggestive of ischemic heart disease.    Assessment:    1. Abnormal stress test   2. Ischemic cardiomyopathy   3. HFrEF (heart failure with reduced ejection fraction) ([place])   4. Aortic valve insufficiency, etiology of cardiac valve disease unspecified   5. History of PSVT (paroxysmal supraventricular tachycardia)   6. Aneurysm of ascending aorta without rupture      Plan:   In order of problems listed above:  1. Abnormal Stress Test/New Cardiomyopathy - Recent NST showed fixed defects in the apex, mid inferolateral and apical lateral walls and her EF is now down to 35-40% and was previously 50-55% in 2021. - Reviewed recommendations from Dr. Domenic Zavala with the patient in regards to a [facility] and she is in agreement to proceed. Risks and benefits reviewed.  - She self-discontinued ASA and Atorvastatin after her last visit as she did not tolerate the four adjustments made at that time. She is in agreement to restarting ASA 81mg  daily (does experience easy bruising) and continuing Toprol-XL 50mg  daily. Pending cath results,  can restart statin therapy but would likely need to restart Atorvastatin at a lower dose or switch to Crestor as she reported myalgias with this.   2. New Cardiomyopathy/HFrEF - Recent echo showed her EF was reduced at 35-40%. She does not appear volume overloaded on examination today and denies  any recent orthopnea, PND or pitting edema.  - She is currently on Toprol-XL 50mg  daily and HCTZ 25mg  daily. Following her cath, would recommend adding an SGLT2-inhibitor and low-dose ARB or Entresto (if BP allows). Would make medication changes gradually as she did not tolerate multiple adjustments in the past and is very hesitant to medication adjustments in general.   3. Aortic Regurgitation - Recent echocardiogram earlier this month showed mild to moderate AI. Will continue to follow.   4. SVT - She denies any recent palpitations. Continue Toprol-XL 50mg  daily. Did not tolerate higher doses.   5. Ascending Thoracic Aortic Aneurysm - This measured at 4.2 cm by CT in 06/2021. Would plan for repeat imaging next year.    Shared Decision Making/Informed Consent:   Shared Decision Making/Informed Consent The risks [stroke (1 in 1000), death (1 in 1000), kidney failure [usually temporary] (1 in 500), bleeding (1 in 200), allergic reaction [possibly serious] (1 in 200)], benefits (diagnostic support and management of coronary artery disease) and alternatives of a cardiac catheterization were discussed in detail with Ms. Florentino and she is willing to proceed.    Medication Adjustments/Labs and Tests Ordered: Current medicines are reviewed at length with the patient today.  Concerns regarding medicines are outlined above.  Medication changes, Labs and Tests ordered today are listed in the Patient Instructions below. Patient Instructions  Medication Instructions:  Your physician recommends that you continue on your current medications as directed. Please refer to the Current Medication list given to you today.  *If you need a refill on your cardiac medications before your next appointment, please call your pharmacy*   Lab Work: Your physician recommends that you return for lab work in: Today (BMET, CBC)   If you have labs (blood work) drawn today and your tests are completely normal, you will  receive your results only by: MyChart Message (if you have MyChart) OR A paper copy in the mail If you have any lab test that is abnormal or we need to change your treatment, we will call you to review the results.   Testing/Procedures: Your physician has requested that you have a cardiac catheterization. Cardiac catheterization is used to diagnose and/or treat various heart conditions. Doctors may recommend this procedure for a number of different reasons. The most common reason is to evaluate chest pain. Chest pain can be a symptom of coronary artery disease (CAD), and cardiac catheterization can show whether plaque is narrowing or blocking your hearts arteries. This procedure is also used to evaluate the valves, as well as measure the blood flow and oxygen levels in different parts of your heart. For further information please visit [email or web address]. Please follow instruction sheet, as given.    Follow-Up: At [facility], you and your health needs are our priority.  As part of our continuing mission to provide you with exceptional heart care, we have created designated Provider Care Teams.  These Care Teams include your primary Cardiologist (physician) and Advanced Practice Providers (APPs -  Physician Assistants and Nurse Practitioners) who all work together to provide you with the care you need, when you need it.  We recommend signing up for the patient portal called "MyChart".  Sign up information is provided on this After Visit Summary.  MyChart is used to connect with patients for Virtual Visits (Telemedicine).  Patients are able to view lab/test results, encounter notes, upcoming appointments, etc.  Non-urgent messages can be sent to your provider as well.   To learn more about what you can do with MyChart, go to [email or web address].    Your next appointment:   3 week(s)  The format for your next appointment:   In Person  Provider:   Rozann Lesches, MD or Bernerd Pho, PA-C    Other Instructions Thank you for choosing [facility]!    [place] [address] Dept: [phone] Loc: [place]  09/10/2021  You are scheduled for a Cardiac Catheterization on Thursday, January 5 with Dr. Harrell Gave End.  1. Please arrive at the [facility] (Main Entrance A) at [facility]: [place], [place] [address] at 9:00 AM (This time is two hours before your procedure to ensure your preparation). Free valet parking service is available.   Special note: Every effort is made to have your procedure done on time. Please understand that emergencies sometimes delay scheduled procedures.  2. Diet: Do not eat solid foods after midnight.  The patient may have clear liquids until 5am upon the day of the procedure.  3. Labs: You will need to have blood drawn on Thursday, December 29 at [address], [place]  Open: 7am - 6pm, Sat 8am - 12 noon   Phone: [phone]. You do not need to be fasting.  4. Medication instructions in preparation for your procedure:   Contrast Allergy: No  On the morning of your procedure, take your Aspirin and any morning medicines NOT listed above.  You may use sips of water.  5. Plan for one night stay--bring personal belongings. 6. Bring a current list of your medications and current insurance cards. 7. You MUST have a responsible person to drive you home. 8. Someone MUST be with you the first 24 hours after you arrive home or your discharge will be delayed. 9. Please wear clothes that are easy to get on and off and wear slip-on shoes.  Thank you for allowing [place] to care for you!   -- [facility]    Signed, Erma Heritage, Hershal Coria  09/10/2021 8:22 PM    [place] [address] [place], [place]  [address] Phone: [phone] Fax: [phone]

## 2021-09-09 NOTE — H&P (View-Only) (Signed)
Cardiology Office Note    Date:  09/10/2021   ID:  Michelle Zavala, DOB March 04, 1946, MRN [id number]  PCP:  Neale Burly, MD  Cardiologist: Rozann Lesches, MD    Chief Complaint  Patient presents with   Follow-up    6 week visit; Discuss echo results    History of Present Illness:    Michelle Zavala is a 75 y.o. female with past medical history of CAD (mild plaque by catheterization in 2010), pSVT, HTN, HLD, ascending thoracic aortic aneurysm (at 4.2 cm by CT in 06/2021), diverticulosis, GERD and COPD who presents to the office today for 6-week follow-up.   She was last examined by Dr. Domenic Polite in 07/2021 and had recently suffered a fall and landed on her chest and her chest had been sore since but she also reported exertional chest pain as well. Recent NST at [facility] had shown fixed defects in the apex, mid inferolateral and apical lateral walls with EF estimated at 41%. A follow-up echocardiogram was recommended and it was discussed she may require R/LHC pending results. She was started on ASA 81mg  daily, Imdur 15mg  daily and Lipitor 40mg  daily along with Toprol-XL being increased to 50mg  in AM/25mg  in PM.   She did call the office in 08/2021 reporting headaches and dizziness with Imdur and Lipitor, therefore she discontinued both. Follow-up echo did show her EF was reduced at 35-40% with WMA present including the apical septal segment, apical inferior segment and apex being akinetic. Also had mildly reduced RV function, trivial MR and mild to moderate AI.  In talking with the patient today, she reports the exertional chest pain she was previously experiencing has improved but she continues to have an aching sensation under her left breast which she feels is due to a fall she experienced in 07/2021. Reports having dyspnea on exertion but says she is not overly active at baseline and is mostly sedentary. No reported orthopnea, PND or lower extremity edema. Says she did stop Imdur, Lipitor  and ASA as she felt awful on the day that she took all of the medications at once. Also reduced her Toprol-XL back to her prior dose of 50 mg daily.  Past Medical History:  Diagnosis Date   Anxiety    Arthritis    C2 cervical fracture ([place]) 09/17/2013   Traumatic fracture witth minimal displacement   Cardiomyopathy ([place])    a. EF 35-40% by echo in 08/2021   Chronic lung disease    Fibrosis - Dr. Koleen Nimrod   COPD (chronic obstructive pulmonary disease) ([place])    Coronary atherosclerosis of native coronary artery    a. Mild atherosclerosis by cath in 2010 b. NST in 07/2021 showing fixed defects but EF at 41% by NST and 35-40% by echo   Depression    Diverticulosis    Essential hypertension    GERD (gastroesophageal reflux disease)    PSVT (paroxysmal supraventricular tachycardia) ([place])     Past Surgical History:  Procedure Laterality Date   ABDOMINAL HYSTERECTOMY     ANTERIOR RELEASE VERTEBRAL BODY W/ POSTERIOR FUSION     BACTERIAL OVERGROWTH TEST N/A 02/19/2016   Procedure: BACTERIAL OVERGROWTH TEST;  Surgeon: Daneil Dolin, MD;  [facility];  Service: Endoscopy;  Laterality: N/A;  0700   BIOPSY N/A 08/04/2015   Procedure: BIOPSY;  Surgeon: Daneil Dolin, MD;  Location: [facility];  Service: Endoscopy;  Laterality: N/A;  Gastric   BIOPSY  02/21/2017   Procedure: BIOPSY;  Surgeon:  Daneil Dolin, MD;  [facility];  Service: Endoscopy;;  ascending colon biopsy    BIOPSY  08/21/2020   Procedure: BIOPSY;  Surgeon: Daneil Dolin, MD;  [facility];  Service: Endoscopy;;   cataract surgery     [place]   COLONOSCOPY  June 2016   Dr. Britta Mccreedy: moderate diverticulosis in sigmoid colon, surveillance in 5 years    COLONOSCOPY WITH PROPOFOL N/A 02/21/2017   Dr. Gala Romney: diverticulosis in sigmoid colon, tubular adenomas, segmental biopsies benign. Surveillance in 5 years    ESOPHAGOGASTRODUODENOSCOPY (EGD) WITH PROPOFOL N/A  08/04/2015   Dr. Rourk:abnormal gastric mucosa s/p biopsy. Reactive gastropathy. Negative H.pylori   ESOPHAGOGASTRODUODENOSCOPY (EGD) WITH PROPOFOL N/A 02/21/2017   Dr. Gala Romney: empiric esophageal dilatation, small hiatal hernia, otherwise normal   ESOPHAGOGASTRODUODENOSCOPY (EGD) WITH PROPOFOL N/A 08/21/2020   normal esophagus s/p dilation. Erythematous mucosa in stomach of doubtful clinical significant s/p biopsy. Negative H.pylori. Mild reactive gastropathy.    INCISIONAL HERNIA REPAIR N/A 12/03/2015   Procedure: [name] HERNIORRHAPHY WITH MESH;  Surgeon: Aviva Signs, MD;  Location: [facility];  Service: General;  Laterality: N/A;   INSERTION OF MESH  12/03/2015   Procedure: INSERTION OF MESH;  Surgeon: Aviva Signs, MD;  Location: [facility];  Service: General;;   IR RADIOLOGIST EVAL & MGMT  09/22/2017   MALONEY DILATION N/A 02/21/2017   Procedure: [name] DILATION;  Surgeon: Daneil Dolin, MD;  [facility];  Service: Endoscopy;  Laterality: N/A;   MALONEY DILATION N/A 08/21/2020   Procedure: [name] DILATION;  Surgeon: Daneil Dolin, MD;  [facility];  Service: Endoscopy;  Laterality: N/A;   POLYPECTOMY  02/21/2017   Procedure: POLYPECTOMY;  Surgeon: Daneil Dolin, MD;  [facility];  Service: Endoscopy;;  hepatic flexure polyp cs   TMJ ARTHROSCOPY Bilateral 02/04/2015   Procedure: BILATERAL TEMPOROMANDIBULAR JOINT (TMJ) ARTHROSCOPY MENISECTOMY WITH FAT GRAFT FROM ABDOMEN ;  Surgeon: Diona Browner, DDS;  Location: [facility];  Service: Oral Surgery;  Laterality: Bilateral;   TOTAL KNEE ARTHROPLASTY Bilateral    TUBAL LIGATION      Current Medications: Outpatient Medications Prior to Visit  Medication Sig Dispense Refill   acetaminophen (TYLENOL) 650 MG CR tablet Take 1,300 mg by mouth every 8 (eight) hours as needed for pain.      albuterol (VENTOLIN HFA) 108 (90 Base) MCG/ACT inhaler Inhale 2 puffs into the lungs every 4 (four) hours as needed for wheezing or  shortness of breath.     amitriptyline (ELAVIL) 75 MG tablet Take 75 mg by mouth at bedtime.  6   Ascorbic Acid (VITAMIN C) 1000 MG tablet Take 1,000 mg by mouth daily.     aspirin EC 81 MG tablet Take 1 tablet (81 mg total) by mouth daily. Swallow whole.     Budeson-Glycopyrrol-Formoterol (BREZTRI AEROSPHERE) 160-9-4.8 MCG/ACT AERO Inhale 2 puffs into the lungs in the morning and at bedtime. 10.7 g 0   cholecalciferol (VITAMIN D3) 25 MCG (1000 UNIT) tablet Take 1,000 Units by mouth daily.     hydrochlorothiazide (HYDRODIURIL) 25 MG tablet Take 25 mg by mouth every morning.     ipratropium (ATROVENT) 0.02 % nebulizer solution Take 0.5 mg by nebulization every 4 (four) hours as needed for wheezing or shortness of breath.     LORazepam (ATIVAN) 1 MG tablet Take 1 mg by mouth 2 (two) times daily.     magnesium hydroxide (MILK  OF MAGNESIA) 400 MG/5ML suspension Take by mouth daily as needed for mild constipation.     metoprolol succinate (TOPROL-XL) 50 MG 24 hr tablet TAKE ONE TABLET BY MOUTH ONCE DAILY 90 tablet 1   Multiple Vitamins-Minerals (PRESERVISION AREDS PO) Take 1 tablet by mouth in the morning and at bedtime.      Omega-3 Fatty Acids (FISH OIL) 1000 MG CAPS Take 3,000 mg by mouth daily.      pantoprazole (PROTONIX) 40 MG tablet Take 1 tablet (40 mg total) by mouth 2 (two) times daily. 60 tablet 1   potassium chloride SA (KLOR-CON) 20 MEQ tablet Take 20 mEq by mouth daily.     sertraline (ZOLOFT) 100 MG tablet Take 100 mg by mouth daily.     Sodium Phosphates (ENEMA RE) Place rectally. As needed     vitamin B-12 (CYANOCOBALAMIN) 1000 MCG tablet Take 1,000 mcg by mouth daily.     atorvastatin (LIPITOR) 40 MG tablet Take 1 tablet (40 mg total) by mouth daily. (Patient not taking: Reported on 09/10/2021) 30 tablet 6   isosorbide mononitrate (IMDUR) 30 MG 24 hr tablet Take 0.5 tablets (15 mg total) by mouth daily. (Patient not taking: Reported on 09/10/2021) 15 tablet 6   metoprolol succinate  (TOPROL XL) 25 MG 24 hr tablet Take 1 tablet (25 mg total) by mouth daily. (WILL TAKE IN ADDITION TO THE 50MG  TABLET) (Patient not taking: Reported on 09/10/2021) 30 tablet 6   No facility-administered medications prior to visit.     Allergies:   Sulfonamide derivatives   Social History   Socioeconomic History   Marital status: Widowed    Spouse name: Not on file   Number of children: 3   Years of education: 7   Highest education level: Not on file  Occupational History   Occupation: Retired  Tobacco Use   Smoking status: Never   Smokeless tobacco: Never  Vaping Use   Vaping Use: Never used  Substance and Sexual Activity   Alcohol use: No    Alcohol/week: 0.0 standard drinks   Drug use: No   Sexual activity: Never    Birth control/protection: None, Surgical  Other Topics Concern   Not on file  Social History Narrative   Occasionally drinks [employer]eps[employer]    Social Determinants of Health   Financial Resource Strain: Not on file  Food Insecurity: Not on file  Transportation Needs: Not on file  Physical Activity: Not on file  Stress: Not on file  Social Connections: Not on file     Family History:  The patient's family history includes Colon cancer in her son; Coronary artery disease in her brother, father, and sister; Heart disease in her father.   Review of Systems:    Please see the history of present illness.     All other systems reviewed and are otherwise negative except as noted above.   Physical Exam:    VS:  BP 128/84    Pulse 94    Ht 5\' 3"  (1.6 m)    Wt 174 lb (78.9 kg)    SpO2 95%    BMI 30.82 kg/m    General: Well developed, well nourished,female appearing in no acute distress. Head: Normocephalic, atraumatic. Neck: No carotid bruits. JVD not elevated.  Lungs: Respirations regular and unlabored, without wheezes or rales.  Heart: Regular rate and rhythm. No S3 or S4.  No murmur, no rubs, or gallops appreciated. Abdomen: Appears non-distended. No obvious  abdominal masses. Msk:  Strength and  tone appear normal for age. No obvious joint deformities or effusions. Extremities: No clubbing or cyanosis. No pitting edema.  Distal pedal pulses are 2+ bilaterally. Neuro: Alert and oriented X 3. Moves all extremities spontaneously. No focal deficits noted. Psych:  Responds to questions appropriately with a normal affect. Skin: No rashes or lesions noted  Wt Readings from Last 3 Encounters:  09/10/21 174 lb (78.9 kg)  08/10/21 171 lb 3.2 oz (77.7 kg)  07/07/21 178 lb 3.2 oz (80.8 kg)     Studies/Labs Reviewed:   EKG:  EKG is not ordered today.   Recent Labs: 09/10/2021: BUN 13; Creatinine, Ser 0.85; Hemoglobin 14.0; Platelets 249; Potassium 3.1; Sodium 136   Lipid Panel    Component Value Date/Time   CHOL 223 (H) 09/23/2020 1454   TRIG 220 (H) 09/23/2020 1454   HDL 57 09/23/2020 1454   CHOLHDL 3.9 09/23/2020 1454   LDLCALC 129 (H) 09/23/2020 1454    Additional studies/ records that were reviewed today include:   NST: 07/2021 1. Small fixed defects identified within the apical, mid  inferolateral, and apicolateral segments..   2. Decreased wall motion and thickening of the apex.   3. Left ventricular ejection fraction 41%   4. Non invasive risk stratification*: Intermediate   Echocardiogram: 08/2021 IMPRESSIONS     1. Left ventricular ejection fraction, by estimation, is 35 to 40%. The  left ventricle has moderately decreased function. The left ventricle  demonstrates regional wall motion abnormalities (see scoring  diagram/findings for description). Left ventricular   diastolic parameters are consistent with Grade I diastolic dysfunction  (impaired relaxation). Abnormal global longitudinal strain of -9.8%.   2. Right ventricular systolic function is mildly reduced. The right  ventricular size is normal. There is normal pulmonary artery systolic  pressure. The estimated right ventricular systolic pressure is 88.5 mmHg.   3.  Left atrial size was moderately dilated.   4. The mitral valve is abnormal. Trivial mitral valve regurgitation.   5. The aortic valve is tricuspid. Aortic valve regurgitation is mild to  moderate. Aortic regurgitation PHT measures 772 msec.   6. Aortic dilatation noted. There is moderate dilatation of the ascending  aorta, measuring 44 mm.   7. The inferior vena cava is normal in size with greater than 50%  respiratory variability, suggesting right atrial pressure of 3 mmHg.   Comparison(s): Prior images reviewed side by side. LVEF has decreased with  wall motion abnormalties suggestive of ischemic heart disease.    Assessment:    1. Abnormal stress test   2. Ischemic cardiomyopathy   3. HFrEF (heart failure with reduced ejection fraction) ([place])   4. Aortic valve insufficiency, etiology of cardiac valve disease unspecified   5. History of PSVT (paroxysmal supraventricular tachycardia)   6. Aneurysm of ascending aorta without rupture      Plan:   In order of problems listed above:  1. Abnormal Stress Test/New Cardiomyopathy - Recent NST showed fixed defects in the apex, mid inferolateral and apical lateral walls and her EF is now down to 35-40% and was previously 50-55% in 2021. - Reviewed recommendations from Dr. Domenic Polite with the patient in regards to a [facility] and she is in agreement to proceed. Risks and benefits reviewed.  - She self-discontinued ASA and Atorvastatin after her last visit as she did not tolerate the four adjustments made at that time. She is in agreement to restarting ASA 81mg  daily (does experience easy bruising) and continuing Toprol-XL 50mg  daily. Pending cath results,  can restart statin therapy but would likely need to restart Atorvastatin at a lower dose or switch to Crestor as she reported myalgias with this.   2. New Cardiomyopathy/HFrEF - Recent echo showed her EF was reduced at 35-40%. She does not appear volume overloaded on examination today and denies  any recent orthopnea, PND or pitting edema.  - She is currently on Toprol-XL 50mg  daily and HCTZ 25mg  daily. Following her cath, would recommend adding an SGLT2-inhibitor and low-dose ARB or Entresto (if BP allows). Would make medication changes gradually as she did not tolerate multiple adjustments in the past and is very hesitant to medication adjustments in general.   3. Aortic Regurgitation - Recent echocardiogram earlier this month showed mild to moderate AI. Will continue to follow.   4. SVT - She denies any recent palpitations. Continue Toprol-XL 50mg  daily. Did not tolerate higher doses.   5. Ascending Thoracic Aortic Aneurysm - This measured at 4.2 cm by CT in 06/2021. Would plan for repeat imaging next year.    Shared Decision Making/Informed Consent:   Shared Decision Making/Informed Consent The risks [stroke (1 in 1000), death (1 in 1000), kidney failure [usually temporary] (1 in 500), bleeding (1 in 200), allergic reaction [possibly serious] (1 in 200)], benefits (diagnostic support and management of coronary artery disease) and alternatives of a cardiac catheterization were discussed in detail with Michelle Zavala and she is willing to proceed.    Medication Adjustments/Labs and Tests Ordered: Current medicines are reviewed at length with the patient today.  Concerns regarding medicines are outlined above.  Medication changes, Labs and Tests ordered today are listed in the Patient Instructions below. Patient Instructions  Medication Instructions:  Your physician recommends that you continue on your current medications as directed. Please refer to the Current Medication list given to you today.  *If you need a refill on your cardiac medications before your next appointment, please call your pharmacy*   Lab Work: Your physician recommends that you return for lab work in: Today (BMET, CBC)   If you have labs (blood work) drawn today and your tests are completely normal, you will  receive your results only by: MyChart Message (if you have MyChart) OR A paper copy in the mail If you have any lab test that is abnormal or we need to change your treatment, we will call you to review the results.   Testing/Procedures: Your physician has requested that you have a cardiac catheterization. Cardiac catheterization is used to diagnose and/or treat various heart conditions. Doctors may recommend this procedure for a number of different reasons. The most common reason is to evaluate chest pain. Chest pain can be a symptom of coronary artery disease (CAD), and cardiac catheterization can show whether plaque is narrowing or blocking your hearts arteries. This procedure is also used to evaluate the valves, as well as measure the blood flow and oxygen levels in different parts of your heart. For further information please visit HugeFiesta.[email or web address]. Please follow instruction sheet, as given.    Follow-Up: At [facility], you and your health needs are our priority.  As part of our continuing mission to provide you with exceptional heart care, we have created designated Provider Care Teams.  These Care Teams include your primary Cardiologist (physician) and Advanced Practice Providers (APPs -  Physician Assistants and Nurse Practitioners) who all work together to provide you with the care you need, when you need it.  We recommend signing up for the patient portal called "MyChart".  Sign up information is provided on this After Visit Summary.  MyChart is used to connect with patients for Virtual Visits (Telemedicine).  Patients are able to view lab/test results, encounter notes, upcoming appointments, etc.  Non-urgent messages can be sent to your provider as well.   To learn more about what you can do with MyChart, go to [email or web address].    Your next appointment:   3 week(s)  The format for your next appointment:   In Person  Provider:   Rozann Lesches, MD or Bernerd Pho, PA-C    Other Instructions Thank you for choosing [facility]!    [address] [place] [address] Dept: [phone] Loc: [place]  09/10/2021  You are scheduled for a Cardiac Catheterization on Thursday, January 5 with Dr. Harrell Gave End.  1. Please arrive at the [facility] (Main Entrance A) at [facility]: [place], [place] [address] at 9:00 AM (This time is two hours before your procedure to ensure your preparation). Free valet parking service is available.   Special note: Every effort is made to have your procedure done on time. Please understand that emergencies sometimes delay scheduled procedures.  2. Diet: Do not eat solid foods after midnight.  The patient may have clear liquids until 5am upon the day of the procedure.  3. Labs: You will need to have blood drawn on Thursday, December 29 at [address], [place]  Open: 7am - 6pm, Sat 8am - 12 noon   Phone: [phone]. You do not need to be fasting.  4. Medication instructions in preparation for your procedure:   Contrast Allergy: No  On the morning of your procedure, take your Aspirin and any morning medicines NOT listed above.  You may use sips of water.  5. Plan for one night stay--bring personal belongings. 6. Bring a current list of your medications and current insurance cards. 7. You MUST have a responsible person to drive you home. 8. Someone MUST be with you the first 24 hours after you arrive home or your discharge will be delayed. 9. Please wear clothes that are easy to get on and off and wear slip-on shoes.  Thank you for allowing [place] to care for you!   -- [facility]    Signed, Erma Heritage, Hershal Coria  09/10/2021 8:22 PM    [facility] [address] [place], [place]  [address] Phone: [phone] Fax: [phone]

## 2021-09-10 ENCOUNTER — Other Ambulatory Visit (HOSPITAL_COMMUNITY)
Admission: RE | Admit: 2021-09-10 | Discharge: 2021-09-10 | Disposition: A | Discharge status: Home / Self Care | Payer: Medicare Other | Source: Ambulatory Visit | Attending: Student | Admitting: Student

## 2021-09-10 ENCOUNTER — Encounter: Payer: Self-pay | Admitting: Student

## 2021-09-10 ENCOUNTER — Other Ambulatory Visit: Payer: Self-pay

## 2021-09-10 ENCOUNTER — Ambulatory Visit (INDEPENDENT_AMBULATORY_CARE_PROVIDER_SITE_OTHER): Payer: Medicare Other | Admitting: Student

## 2021-09-10 VITALS — BP 128/84 | HR 94 | Ht 63.0 in | Wt 174.0 lb

## 2021-09-10 DIAGNOSIS — I351 Nonrheumatic aortic (valve) insufficiency: Secondary | ICD-10-CM | POA: Diagnosis not present

## 2021-09-10 DIAGNOSIS — R9439 Abnormal result of other cardiovascular function study: Secondary | ICD-10-CM | POA: Diagnosis not present

## 2021-09-10 DIAGNOSIS — I7121 Aneurysm of the ascending aorta, without rupture: Secondary | ICD-10-CM

## 2021-09-10 DIAGNOSIS — I5020 Unspecified systolic (congestive) heart failure: Secondary | ICD-10-CM | POA: Diagnosis not present

## 2021-09-10 DIAGNOSIS — I255 Ischemic cardiomyopathy: Secondary | ICD-10-CM | POA: Diagnosis not present

## 2021-09-10 DIAGNOSIS — Z8679 Personal history of other diseases of the circulatory system: Secondary | ICD-10-CM | POA: Diagnosis not present

## 2021-09-10 LAB — CBC
HCT: 40.1 % (ref 36.0–46.0)
Hemoglobin: 14 g/dL (ref 12.0–15.0)
MCH: 32.6 pg (ref 26.0–34.0)
MCHC: 34.9 g/dL (ref 30.0–36.0)
MCV: 93.3 fL (ref 80.0–100.0)
Platelets: 249 10*3/uL (ref 150–400)
RBC: 4.3 MIL/uL (ref 3.87–5.11)
RDW: 13.2 % (ref 11.5–15.5)
WBC: 12.7 10*3/uL — ABNORMAL HIGH (ref 4.0–10.5)
nRBC: 0 % (ref 0.0–0.2)

## 2021-09-10 LAB — BASIC METABOLIC PANEL
Anion gap: 11 (ref 5–15)
BUN: 13 mg/dL (ref 8–23)
CO2: 24 mmol/L (ref 22–32)
Calcium: 9.5 mg/dL (ref 8.9–10.3)
Chloride: 101 mmol/L (ref 98–111)
Creatinine, Ser: 0.85 mg/dL (ref 0.44–1.00)
GFR, Estimated: >60 mL/min (ref >60)
Glucose, Bld: 146 mg/dL — ABNORMAL HIGH (ref 70–99)
Potassium: 3.1 mmol/L — ABNORMAL LOW (ref 3.5–5.1)
Sodium: 136 mmol/L (ref 135–145)

## 2021-09-10 NOTE — Patient Instructions (Signed)
Medication Instructions:  Your physician recommends that you continue on your current medications as directed. Please refer to the Current Medication list given to you today.  *If you need a refill on your cardiac medications before your next appointment, please call your pharmacy*   Lab Work: Your physician recommends that you return for lab work in: Today (BMET, CBC)   If you have labs (blood work) drawn today and your tests are completely normal, you will receive your results only by: MyChart Message (if you have MyChart) OR A paper copy in the mail If you have any lab test that is abnormal or we need to change your treatment, we will call you to review the results.   Testing/Procedures: Your physician has requested that you have a cardiac catheterization. Cardiac catheterization is used to diagnose and/or treat various heart conditions. Doctors may recommend this procedure for a number of different reasons. The most common reason is to evaluate chest pain. Chest pain can be a symptom of coronary artery disease (CAD), and cardiac catheterization can show whether plaque is narrowing or blocking your hearts arteries. This procedure is also used to evaluate the valves, as well as measure the blood flow and oxygen levels in different parts of your heart. For further information please visit HugeFiesta.tn. Please follow instruction sheet, as given.    Follow-Up: At [facility], you and your health needs are our priority.  As part of our continuing mission to provide you with exceptional heart care, we have created designated Provider Care Teams.  These Care Teams include your primary Cardiologist (physician) and Advanced Practice Providers (APPs -  Physician Assistants and Nurse Practitioners) who all work together to provide you with the care you need, when you need it.  We recommend signing up for the patient portal called "MyChart".  Sign up information is provided on this After  Visit Summary.  MyChart is used to connect with patients for Virtual Visits (Telemedicine).  Patients are able to view lab/test results, encounter notes, upcoming appointments, etc.  Non-urgent messages can be sent to your provider as well.   To learn more about what you can do with MyChart, go to [email or web address].    Your next appointment:   3 week(s)  The format for your next appointment:   In Person  Provider:   Rozann Lesches, MD or Bernerd Pho, PA-C    Other Instructions Thank you for choosing [facility]!    [address] [place] [address] Dept: [phone] Loc: [place]  09/10/2021  You are scheduled for a Cardiac Catheterization on Thursday, January 5 with Dr. Harrell Gave End.  1. Please arrive at the [facility] (Main Entrance A) at [facility]: [place], [place] [address] at 9:00 AM (This time is two hours before your procedure to ensure your preparation). Free valet parking service is available.   Special note: Every effort is made to have your procedure done on time. Please understand that emergencies sometimes delay scheduled procedures.  2. Diet: Do not eat solid foods after midnight.  The patient may have clear liquids until 5am upon the day of the procedure.  3. Labs: You will need to have blood drawn on Thursday, December 29 at [address], [place]  Open: 7am - 6pm, Sat 8am - 12 noon   Phone: [phone]. You do not need to be fasting.  4.  Medication instructions in preparation for your procedure:   Contrast Allergy: No  On the morning of your procedure, take your Aspirin and any morning medicines NOT listed above.  You may use sips of water.  5. Plan for one night stay--bring personal belongings. 6. Bring a current list of your medications and current insurance cards. 7. You  MUST have a responsible person to drive you home. 8. Someone MUST be with you the first 24 hours after you arrive home or your discharge will be delayed. 9. Please wear clothes that are easy to get on and off and wear slip-on shoes.  Thank you for allowing [place] to care for you!   -- [facility]

## 2021-09-15 ENCOUNTER — Telehealth: Payer: Self-pay | Admitting: *Deleted

## 2021-09-15 ENCOUNTER — Telehealth: Payer: Self-pay | Admitting: Cardiology

## 2021-09-15 MED ORDER — POTASSIUM CHLORIDE CRYS ER 20 MEQ PO TBCR: 40.0000 meq | EXTENDED_RELEASE_TABLET | Freq: Every day | ORAL | 3 refills | Status: DC

## 2021-09-15 NOTE — Telephone Encounter (Signed)
Returned call to pt and test results given.

## 2021-09-15 NOTE — Telephone Encounter (Signed)
-----   Message from Erma Heritage, [place] sent at 09/10/2021  4:31 PM EST ----- Please let the patient know her kidney function and and sodium are within a normal range. Potassium low at 3.1. She is listed as being on K-dur 20 mEq daily. If taking this, would titrate to 40 mEq daily. Repeat BMET next Tuesday or Wednesday if able. Hgb and platelets normal. WBC count mildly elevated and this has been noted in the past as well. Will need to be followed by her PCP. Please send a copy to Neale Burly, MD.

## 2021-09-15 NOTE — Telephone Encounter (Signed)
Spoke with pt on today. She will needs repeat BMET on 09/17/21. Orders placed for K-dur 40 meq daily.

## 2021-09-15 NOTE — Telephone Encounter (Signed)
° °  Pt said she received a call from [place] this morning asking for her meds list. She is guessing its about her upcoming procedure

## 2021-09-16 ENCOUNTER — Telehealth: Payer: Self-pay | Admitting: *Deleted

## 2021-09-16 NOTE — Telephone Encounter (Addendum)
Cardiac catheterization scheduled at [facility] for: Thursday September 17, 2021 [facility] Main Entrance [facility]) at: 8:30 AM-needs BMP   Diet-no solid food after midnight prior to cath, clear liquids until 5 AM day of procedure.   Medication instructions for procedure: -Hold:  HCTZ-AM of procedure -Except hold medications usual morning medications can be taken pre-cath with sips of water including aspirin 81 mg.    Confirmed patient has responsible adult to drive home post procedure and be with patient first 24 hours after arriving home.  [facility] does allow one visitor to accompany you and wait in the hospital waiting room while you are there for your procedure. You and your visitor will be asked to wear a mask once you enter the hospital.   Patient reports does not currently have any new symptoms concerning for COVID-19 and no household members with COVID-19 like illness.    Reviewed procedure/mask/visitor instructions with patient.

## 2021-09-17 ENCOUNTER — Encounter (HOSPITAL_COMMUNITY)
Admission: RE | Disposition: A | Discharge status: Home / Self Care | Payer: Medicare Other | Source: Home / Self Care | Attending: Internal Medicine

## 2021-09-17 ENCOUNTER — Encounter (HOSPITAL_COMMUNITY): Payer: Self-pay | Admitting: Internal Medicine

## 2021-09-17 ENCOUNTER — Ambulatory Visit (HOSPITAL_COMMUNITY)
Admission: RE | Admit: 2021-09-17 | Discharge: 2021-09-17 | Disposition: A | Discharge status: Home / Self Care | Payer: Medicare Other | Attending: Internal Medicine | Admitting: Internal Medicine

## 2021-09-17 ENCOUNTER — Other Ambulatory Visit: Payer: Self-pay

## 2021-09-17 DIAGNOSIS — I471 Supraventricular tachycardia: Secondary | ICD-10-CM | POA: Insufficient documentation

## 2021-09-17 DIAGNOSIS — I429 Cardiomyopathy, unspecified: Secondary | ICD-10-CM | POA: Diagnosis not present

## 2021-09-17 DIAGNOSIS — I2510 Atherosclerotic heart disease of native coronary artery without angina pectoris: Secondary | ICD-10-CM | POA: Diagnosis not present

## 2021-09-17 DIAGNOSIS — I110 Hypertensive heart disease with heart failure: Secondary | ICD-10-CM | POA: Diagnosis not present

## 2021-09-17 DIAGNOSIS — Z79899 Other long term (current) drug therapy: Secondary | ICD-10-CM | POA: Diagnosis not present

## 2021-09-17 DIAGNOSIS — I5020 Unspecified systolic (congestive) heart failure: Secondary | ICD-10-CM | POA: Insufficient documentation

## 2021-09-17 DIAGNOSIS — I7121 Aneurysm of the ascending aorta, without rupture: Secondary | ICD-10-CM | POA: Diagnosis not present

## 2021-09-17 DIAGNOSIS — I255 Ischemic cardiomyopathy: Secondary | ICD-10-CM | POA: Insufficient documentation

## 2021-09-17 DIAGNOSIS — I351 Nonrheumatic aortic (valve) insufficiency: Secondary | ICD-10-CM | POA: Diagnosis not present

## 2021-09-17 DIAGNOSIS — R9439 Abnormal result of other cardiovascular function study: Secondary | ICD-10-CM | POA: Diagnosis present

## 2021-09-17 HISTORY — PX: RIGHT/LEFT HEART CATH AND CORONARY ANGIOGRAPHY: CATH118266

## 2021-09-17 LAB — POCT I-STAT EG7
Acid-base deficit: 2 mmol/L (ref 0.0–2.0)
Bicarbonate: 23.8 mmol/L (ref 20.0–28.0)
Calcium, Ion: 1.25 mmol/L (ref 1.15–1.40)
HCT: 32 % — ABNORMAL LOW (ref 36.0–46.0)
Hemoglobin: 10.9 g/dL — ABNORMAL LOW (ref 12.0–15.0)
O2 Saturation: 66 %
Potassium: 3.3 mmol/L — ABNORMAL LOW (ref 3.5–5.1)
Sodium: 142 mmol/L (ref 135–145)
TCO2: 25 mmol/L (ref 22–32)
pCO2, Ven: 42 mmHg — ABNORMAL LOW (ref 44.0–60.0)
pH, Ven: 7.361 (ref 7.250–7.430)
pO2, Ven: 36 mmHg (ref 32.0–45.0)

## 2021-09-17 LAB — BASIC METABOLIC PANEL
Anion gap: 9 (ref 5–15)
BUN: 15 mg/dL (ref 8–23)
CO2: 24 mmol/L (ref 22–32)
Calcium: 9.6 mg/dL (ref 8.9–10.3)
Chloride: 105 mmol/L (ref 98–111)
Creatinine, Ser: 0.77 mg/dL (ref 0.44–1.00)
GFR, Estimated: >60 mL/min (ref >60)
Glucose, Bld: 127 mg/dL — ABNORMAL HIGH (ref 70–99)
Potassium: 4.2 mmol/L (ref 3.5–5.1)
Sodium: 138 mmol/L (ref 135–145)

## 2021-09-17 LAB — POCT I-STAT 7, (LYTES, BLD GAS, ICA,H+H)
Acid-base deficit: 2 mmol/L (ref 0.0–2.0)
Bicarbonate: 23 mmol/L (ref 20.0–28.0)
Calcium, Ion: 1.32 mmol/L (ref 1.15–1.40)
HCT: 33 % — ABNORMAL LOW (ref 36.0–46.0)
Hemoglobin: 11.2 g/dL — ABNORMAL LOW (ref 12.0–15.0)
O2 Saturation: 97 %
Potassium: 3.5 mmol/L (ref 3.5–5.1)
Sodium: 141 mmol/L (ref 135–145)
TCO2: 24 mmol/L (ref 22–32)
pCO2 arterial: 39.9 mmHg (ref 32.0–48.0)
pH, Arterial: 7.37 (ref 7.350–7.450)
pO2, Arterial: 90 mmHg (ref 83.0–108.0)

## 2021-09-17 SURGERY — RIGHT/LEFT HEART CATH AND CORONARY ANGIOGRAPHY
Anesthesia: LOCAL

## 2021-09-17 MED ORDER — MIDAZOLAM HCL 2 MG/2ML IJ SOLN
INTRAMUSCULAR | Status: DC | PRN
  Administered 2021-09-17: 1 mg via INTRAVENOUS

## 2021-09-17 MED ORDER — ONDANSETRON HCL 4 MG/2ML IJ SOLN: 4.0000 mg | Freq: Four times a day (QID) | INTRAMUSCULAR | Status: DC | PRN

## 2021-09-17 MED ORDER — LIDOCAINE HCL (PF) 1 % IJ SOLN
INTRAMUSCULAR | Status: DC | PRN
  Administered 2021-09-17 (×2): 2 mL

## 2021-09-17 MED ORDER — SODIUM CHLORIDE 0.9% FLUSH: 3.0000 mL | INTRAVENOUS | Status: DC | PRN

## 2021-09-17 MED ORDER — HYDRALAZINE HCL 20 MG/ML IJ SOLN: 10.0000 mg | INTRAMUSCULAR | Status: DC | PRN

## 2021-09-17 MED ORDER — IOHEXOL 350 MG/ML SOLN
INTRAVENOUS | Status: DC | PRN
  Administered 2021-09-17: 50 mL

## 2021-09-17 MED ORDER — SODIUM CHLORIDE 0.9 % IV SOLN: INTRAVENOUS | Status: DC

## 2021-09-17 MED ORDER — MIDAZOLAM HCL 2 MG/2ML IJ SOLN
INTRAMUSCULAR | Status: AC
  Filled 2021-09-17: qty 2

## 2021-09-17 MED ORDER — VERAPAMIL HCL 2.5 MG/ML IV SOLN
INTRAVENOUS | Status: DC | PRN
  Administered 2021-09-17: 10 mL via INTRA_ARTERIAL

## 2021-09-17 MED ORDER — ASPIRIN 81 MG PO CHEW: 81.0000 mg | CHEWABLE_TABLET | ORAL | Status: DC

## 2021-09-17 MED ORDER — SODIUM CHLORIDE 0.9% FLUSH: 3.0000 mL | Freq: Two times a day (BID) | INTRAVENOUS | Status: DC

## 2021-09-17 MED ORDER — VERAPAMIL HCL 2.5 MG/ML IV SOLN
INTRAVENOUS | Status: AC
  Filled 2021-09-17: qty 2

## 2021-09-17 MED ORDER — HYDRALAZINE HCL 20 MG/ML IJ SOLN
INTRAMUSCULAR | Status: AC
  Filled 2021-09-17: qty 1

## 2021-09-17 MED ORDER — ACETAMINOPHEN 325 MG PO TABS: 650.0000 mg | ORAL_TABLET | ORAL | Status: DC | PRN

## 2021-09-17 MED ORDER — HEPARIN (PORCINE) IN NACL 1000-0.9 UT/500ML-% IV SOLN
INTRAVENOUS | Status: AC
  Filled 2021-09-17: qty 1000

## 2021-09-17 MED ORDER — HEPARIN SODIUM (PORCINE) 1000 UNIT/ML IJ SOLN
INTRAMUSCULAR | Status: AC
  Filled 2021-09-17: qty 10

## 2021-09-17 MED ORDER — FENTANYL CITRATE (PF) 100 MCG/2ML IJ SOLN
INTRAMUSCULAR | Status: DC | PRN
  Administered 2021-09-17: 25 ug via INTRAVENOUS

## 2021-09-17 MED ORDER — FENTANYL CITRATE (PF) 100 MCG/2ML IJ SOLN
INTRAMUSCULAR | Status: AC
  Filled 2021-09-17: qty 2

## 2021-09-17 MED ORDER — LIDOCAINE HCL (PF) 1 % IJ SOLN
INTRAMUSCULAR | Status: AC
  Filled 2021-09-17: qty 30

## 2021-09-17 MED ORDER — SODIUM CHLORIDE 0.9 % IV SOLN: 250.0000 mL | INTRAVENOUS | Status: DC | PRN

## 2021-09-17 MED ORDER — HEPARIN SODIUM (PORCINE) 1000 UNIT/ML IJ SOLN
INTRAMUSCULAR | Status: DC | PRN
  Administered 2021-09-17: 4000 [IU] via INTRAVENOUS

## 2021-09-17 MED ORDER — HYDRALAZINE HCL 20 MG/ML IJ SOLN
INTRAMUSCULAR | Status: DC | PRN
  Administered 2021-09-17: 10 mg via INTRAVENOUS

## 2021-09-17 MED ORDER — HEPARIN (PORCINE) IN NACL 1000-0.9 UT/500ML-% IV SOLN
INTRAVENOUS | Status: DC | PRN
  Administered 2021-09-17 (×2): 500 mL

## 2021-09-17 SURGICAL SUPPLY — 13 items
CATH BALLN WEDGE 5F 110CM (CATHETERS) ×1 IMPLANT
CATH INFINITI 5FR MULTPACK ANG (CATHETERS) ×1 IMPLANT
DEVICE RAD COMP TR BAND LRG (VASCULAR PRODUCTS) ×1 IMPLANT
GLIDESHEATH SLEND SS 6F .021 (SHEATH) ×1 IMPLANT
GUIDEWIRE .025 260CM (WIRE) ×1 IMPLANT
GUIDEWIRE INQWIRE 1.5J.035X260 (WIRE) IMPLANT
INQWIRE 1.5J .035X260CM (WIRE) ×2 IMPLANT
KIT HEART LEFT (KITS) ×2 IMPLANT
PACK CARDIAC CATHETERIZATION (CUSTOM PROCEDURE TRAY) ×2 IMPLANT
SHEATH GLIDE SLENDER 4/5FR (SHEATH) ×1 IMPLANT
SYR MEDRAD MARK 7 150ML (SYRINGE) ×2 IMPLANT
TRANSDUCER W/STOPCOCK (MISCELLANEOUS) ×2 IMPLANT
TUBING CIL FLEX 10 FLL-RA (TUBING) ×2 IMPLANT

## 2021-09-17 NOTE — Interval H&P Note (Signed)
History and Physical Interval Note:  09/17/2021 10:42 AM  Michelle Zavala  has presented today for surgery, with the diagnosis of cardiomyopathy and abnormal stress test.  The various methods of treatment have been discussed with the patient and family. After consideration of risks, benefits and other options for treatment, the patient has consented to  Procedure(s): RIGHT/LEFT HEART CATH AND CORONARY ANGIOGRAPHY (N/A) as a surgical intervention.  The patient's history has been reviewed, patient examined, no change in status, stable for surgery.  I have reviewed the patient's chart and labs.  Questions were answered to the patient's satisfaction.    Cath Lab Visit (complete for each Cath Lab visit)  Clinical Evaluation Leading to the Procedure:   ACS: No.  Non-ACS:    Anginal Classification: CCS II  Anti-ischemic medical therapy: Minimal Therapy (1 class of medications)  Non-Invasive Test Results: Intermediate-risk stress test findings: cardiac mortality 1-3%/year  Prior CABG: No previous CABG  Gillie Crisci

## 2021-09-24 NOTE — Progress Notes (Signed)
] Cardiology Office Note:    Date:  09/26/2021   ID:  Michelle Zavala, DOB 17-Mar-1946, MRN [id number]  PCP:  Neale Burly, MD  [facility] Cardiologist:  Rozann Lesches, MD  [place] Electrophysiologist:  None   Referring MD: Neale Burly, MD   Chief Complaint: cath follow-up  History of Present Illness:    Michelle Zavala is a 76 y.o. female with a hx of past medical history of CAD (mild plaque by catheterization in 2010), pSVT, HTN, HLD, ascending thoracic aortic aneurysm (at 4.2 cm by CT in 06/2021), diverticulosis, GERD and COPD who presents for cardiac cath follow-up.   She was last examined by Dr. Domenic Polite in 07/2021 and had recently suffered a fall and landed on her chest and her chest had been sore since but she also reported exertional chest pain as well. Recent NST at [facility] had shown fixed defects in the apex, mid inferolateral and apical lateral walls with EF estimated at 41%. A follow-up echocardiogram was recommended and it was discussed she may require R/LHC pending results. She was started on ASA 81mg  daily, Imdur 15mg  daily and Lipitor 40mg  daily along with Toprol-XL being increased to 50mg  in AM/25mg  in PM.   She did call the office in 08/2021 reporting headaches and dizziness with Imdur and Lipitor, therefore she discontinued both. Follow-up echo did show her EF was reduced at 35-40% with WMA present including the apical septal segment, apical inferior segment and apex being akinetic. Also had mildly reduced RV function, trivial MR and mild to moderate AI.  Seen in follow-up 09/10/21 and reported exertional chest pain. She was set up for cardiac cath. Cath showed mild to moderate nonobstructive CAD including 20% eccentric LMCA stenosis, sequential 40% mid and distal LAD stenoses, 30% OM1 and OM2 lesions, 60% LPL1 stenosis, and 30% proximal RCA stenosis. Mildly elevated left heart, right heart and pulmonary artery pressures, mildly reduced Fick cardiac  output/index. Recommend escalate GDMT for NICM and diuresis.   Today, cath reports reviewed. The patient reports she has been doing well since discharge. Cath site, left wrist, is stable. She denies chest pain and short of breath. No LLE, orthopnea, pnd, palpitations.   Takes tylenol- with aspirin pill for arthritis, unsure of the dose, can call in with the dose.   Past Medical History:  Diagnosis Date   Anxiety    Arthritis    C2 cervical fracture ([place]) 09/17/2013   Traumatic fracture witth minimal displacement   Cardiomyopathy ([place])    a. EF 35-40% by echo in 08/2021   Chronic lung disease    Fibrosis - Dr. Koleen Nimrod   COPD (chronic obstructive pulmonary disease) ([place])    Coronary atherosclerosis of native coronary artery    a. Mild atherosclerosis by cath in 2010 b. NST in 07/2021 showing fixed defects but EF at 41% by NST and 35-40% by echo   Depression    Diverticulosis    Essential hypertension    GERD (gastroesophageal reflux disease)    PSVT (paroxysmal supraventricular tachycardia) ([place])     Past Surgical History:  Procedure Laterality Date   ABDOMINAL HYSTERECTOMY     ANTERIOR RELEASE VERTEBRAL BODY W/ POSTERIOR FUSION     BACTERIAL OVERGROWTH TEST N/A 02/19/2016   Procedure: BACTERIAL OVERGROWTH TEST;  Surgeon: Daneil Dolin, MD;  [facility];  Service: Endoscopy;  Laterality: N/A;  0700   BIOPSY N/A 08/04/2015   Procedure: BIOPSY;  Surgeon: Daneil Dolin, MD;  Location: [facility]  ORS;  Service: Endoscopy;  Laterality: N/A;  Gastric   BIOPSY  02/21/2017   Procedure: BIOPSY;  Surgeon: Daneil Dolin, MD;  [facility];  Service: Endoscopy;;  ascending colon biopsy    BIOPSY  08/21/2020   Procedure: BIOPSY;  Surgeon: Daneil Dolin, MD;  [facility];  Service: Endoscopy;;   cataract surgery     [place]   COLONOSCOPY  June 2016   Dr. Britta Mccreedy: moderate diverticulosis in sigmoid colon, surveillance in 5  years    COLONOSCOPY WITH PROPOFOL N/A 02/21/2017   Dr. Gala Romney: diverticulosis in sigmoid colon, tubular adenomas, segmental biopsies benign. Surveillance in 5 years    ESOPHAGOGASTRODUODENOSCOPY (EGD) WITH PROPOFOL N/A 08/04/2015   Dr. Rourk:abnormal gastric mucosa s/p biopsy. Reactive gastropathy. Negative H.pylori   ESOPHAGOGASTRODUODENOSCOPY (EGD) WITH PROPOFOL N/A 02/21/2017   Dr. Gala Romney: empiric esophageal dilatation, small hiatal hernia, otherwise normal   ESOPHAGOGASTRODUODENOSCOPY (EGD) WITH PROPOFOL N/A 08/21/2020   normal esophagus s/p dilation. Erythematous mucosa in stomach of doubtful clinical significant s/p biopsy. Negative H.pylori. Mild reactive gastropathy.    INCISIONAL HERNIA REPAIR N/A 12/03/2015   Procedure: [name] HERNIORRHAPHY WITH MESH;  Surgeon: Aviva Signs, MD;  Location: [facility] ORS;  Service: General;  Laterality: N/A;   INSERTION OF MESH  12/03/2015   Procedure: INSERTION OF MESH;  Surgeon: Aviva Signs, MD;  Location: [facility] OR[facility];  Service: General;;   IR RADIOLOGIST EVAL & MGMT  09/22/2017   MALONEY DILATION N/A 02/21/2017   Procedure: [name] DILATION;  Surgeon: Daneil Dolin, MD;  [facility];  Service: Endoscopy;  Laterality: N/A;   MALONEY DILATION N/A 08/21/2020   Procedure: [name] DILATION;  Surgeon: Daneil Dolin, MD;  [facility];  Service: Endoscopy;  Laterality: N/A;   POLYPECTOMY  02/21/2017   Procedure: POLYPECTOMY;  Surgeon: Daneil Dolin, MD;  [facility];  Service: Endoscopy;;  hepatic flexure polyp cs   RIGHT/LEFT HEART CATH AND CORONARY ANGIOGRAPHY N/A 09/17/2021   Procedure: RIGHT/LEFT HEART CATH AND CORONARY ANGIOGRAPHY;  Surgeon: Nelva Bush, MD;  Location: [place] [facility];  Service: Cardiovascular;  Laterality: N/A;   TMJ ARTHROSCOPY Bilateral 02/04/2015   Procedure: BILATERAL TEMPOROMANDIBULAR JOINT (TMJ) ARTHROSCOPY MENISECTOMY WITH FAT GRAFT FROM ABDOMEN ;  Surgeon: Diona Browner, DDS;  Location: [place];   Service: Oral Surgery;  Laterality: Bilateral;   TOTAL KNEE ARTHROPLASTY Bilateral    TUBAL LIGATION      Current Medications: Current Meds  Medication Sig   acetaminophen (TYLENOL) 650 MG CR tablet Take 1,300 mg by mouth every 8 (eight) hours as needed for pain.    albuterol (VENTOLIN HFA) 108 (90 Base) MCG/ACT inhaler Inhale 2 puffs into the lungs every 4 (four) hours as needed for wheezing or shortness of breath.   amitriptyline (ELAVIL) 75 MG tablet Take 75 mg by mouth at bedtime.   Ascorbic Acid (VITAMIN C) 1000 MG tablet Take 1,000 mg by mouth daily.   Budeson-Glycopyrrol-Formoterol (BREZTRI AEROSPHERE) 160-9-4.8 MCG/ACT AERO Inhale 2 puffs into the lungs in the morning and at bedtime. (Patient taking differently: Inhale 2 puffs into the lungs 2 (two) times daily as needed (shortness of breath).)   cholecalciferol (VITAMIN D3) 25 MCG (1000 UNIT) tablet Take 1,000 Units by mouth daily.   ipratropium (ATROVENT) 0.02 % nebulizer solution Take 0.5 mg by nebulization every 4 (four) hours as needed for wheezing or shortness of breath.   LORazepam (ATIVAN) 1  MG tablet Take 1 mg by mouth 2 (two) times daily.   magnesium hydroxide (MILK OF MAGNESIA) 400 MG/5ML suspension Take 30 mLs by mouth daily as needed for mild constipation.   methocarbamol (ROBAXIN) 500 MG tablet Take 500 mg by mouth 2 (two) times daily.   metoprolol succinate (TOPROL-XL) 50 MG 24 hr tablet TAKE ONE TABLET BY MOUTH ONCE DAILY (Patient taking differently: Take 50 mg by mouth daily.)   Omega-3 Fatty Acids (FISH OIL) 1000 MG CAPS Take 3,000 mg by mouth daily.    pantoprazole (PROTONIX) 40 MG tablet Take 1 tablet (40 mg total) by mouth 2 (two) times daily.   potassium chloride SA (KLOR-CON M) 20 MEQ tablet Take 2 tablets (40 mEq total) by mouth daily.   sacubitril-valsartan (ENTRESTO) 24-26 MG Take 1 tablet by mouth 2 (two) times daily.   sertraline (ZOLOFT) 100 MG tablet Take 100 mg by mouth daily.   Sodium Phosphates (ENEMA  RE) Place rectally. As needed   vitamin B-12 (CYANOCOBALAMIN) 1000 MCG tablet Take 1,000 mcg by mouth daily.   [DISCONTINUED] hydrochlorothiazide (HYDRODIURIL) 25 MG tablet Take 25 mg by mouth every morning.     Allergies:   Sulfonamide derivatives   Social History   Socioeconomic History   Marital status: Widowed    Spouse name: Not on file   Number of children: 3   Years of education: 7   Highest education level: Not on file  Occupational History   Occupation: Retired  Tobacco Use   Smoking status: Never   Smokeless tobacco: Never  Vaping Use   Vaping Use: Never used  Substance and Sexual Activity   Alcohol use: No    Alcohol/week: 0.0 standard drinks   Drug use: No   Sexual activity: Never    Birth control/protection: None, Surgical  Other Topics Concern   Not on file  Social History Narrative   Occasionally drinks [employer]    Social Determinants of Health   Financial Resource Strain: Not on file  Food Insecurity: Not on file  Transportation Needs: Not on file  Physical Activity: Not on file  Stress: Not on file  Social Connections: Not on file     Family History: The patient's family history includes Colon cancer in her son; Coronary artery disease in her brother, father, and sister; Heart disease in her father.  ROS:   Please see the history of present illness.     All other systems reviewed and are negative.  EKGs/Labs/Other Studies Reviewed:    The following studies were reviewed today:  [facility] 07/18/22 Conclusions: Mild-moderate, non-obstructive coronary artery disease, including 20% eccentric LMCA stenosis, sequential 40% mid and distal LAD stenoses, 30% OM1 and OM2 lesions, 60% LPL1 stenosis, and 30% proximal RCA stenosis. Mildly elevated left heart, right heart, and pulmonary artery pressures. Mildly reduced Fick cardiac output/index.   Recommendations: Escalate goal directed medical therapy for management of non-ischemic cardiomyopathy, as  tolerated. Consider escalation of diuresis. Medical therapy and risk factor modification to prevent progression of non-obstructive coronary artery disease.   Nelva Bush, MD [facility]     Recommendations  Antiplatelet/Anticoag Recommend Aspirin 81mg  daily for moderate CAD.  Discharge Date In the absence of any other complications or medical issues, we expect the patient to be ready for discharge from a cath perspective on 09/17/2021.   Coronary Diagrams  Diagnostic Dominance: Co-dominant Intervention  Echo 08/17/21  1. Left ventricular ejection fraction, by estimation, is 35 to 40%. The  left ventricle has moderately decreased  function. The left ventricle  demonstrates regional wall motion abnormalities (see scoring  diagram/findings for description). Left ventricular   diastolic parameters are consistent with Grade I diastolic dysfunction  (impaired relaxation). Abnormal global longitudinal strain of -9.8%.   2. Right ventricular systolic function is mildly reduced. The right  ventricular size is normal. There is normal pulmonary artery systolic  pressure. The estimated right ventricular systolic pressure is 20.9 mmHg.   3. Left atrial size was moderately dilated.   4. The mitral valve is abnormal. Trivial mitral valve regurgitation.   5. The aortic valve is tricuspid. Aortic valve regurgitation is mild to  moderate. Aortic regurgitation PHT measures 772 msec.   6. Aortic dilatation noted. There is moderate dilatation of the ascending  aorta, measuring 44 mm.   7. The inferior vena cava is normal in size with greater than 50%  respiratory variability, suggesting right atrial pressure of 3 mmHg.    EKG:  EKG is  ordered today.  The ekg ordered today demonstrates NSR, PVCs, 79bpm, LAFB, no sig St/T wave changes  Recent Labs: 09/10/2021: Platelets 249 09/17/2021: BUN 15; Creatinine, Ser 0.77; Hemoglobin 11.2; Potassium 3.5; Sodium 141  Recent Lipid Panel    Component  Value Date/Time   CHOL 223 (H) 09/23/2020 1454   TRIG 220 (H) 09/23/2020 1454   HDL 57 09/23/2020 1454   CHOLHDL 3.9 09/23/2020 1454   LDLCALC 129 (H) 09/23/2020 1454       Physical Exam:    VS:  BP 126/82    Pulse 84    Ht 5\' 3"  (1.6 m)    Wt 173 lb (78.5 kg)    SpO2 98%    BMI 30.65 kg/m     Wt Readings from Last 3 Encounters:  09/25/21 173 lb (78.5 kg)  09/17/21 174 lb (78.9 kg)  09/10/21 174 lb (78.9 kg)     GEN:  Well nourished, well developed in no acute distress HEENT: Normal NECK: No JVD; No carotid bruits LYMPHATICS: No lymphadenopathy CARDIAC: RRR, no murmurs, rubs, gallops RESPIRATORY:  Clear to auscultation without rales, wheezing or rhonchi  ABDOMEN: Soft, non-tender, non-distended MUSCULOSKELETAL:  No edema; No deformity  SKIN: Warm and dry NEUROLOGIC:  Alert and oriented x 3 PSYCHIATRIC:  Normal affect   ASSESSMENT:    1. Atherosclerosis of native coronary artery of native heart without angina pectoris   2. Medication management   3. Aortic valve insufficiency, etiology of cardiac valve disease unspecified   4. PSVT (paroxysmal supraventricular tachycardia) (HCC)   5. HFrEF (heart failure with reduced ejection fraction) ([place])   6. Nonischemic cardiomyopathy (HCC)    PLAN:    In order of problems listed above:  HFrEF EF 35-40% NICM Cath showed mild to moderate nonobstructive CAD, mildly elevated left heart, right heart and pulmonary artery pressures, mildly reduced Fick cardiac output/index. Recommend escalate GDMT for NICM and diuresis. Patient is euvolemic on exam today. Continue Toprol 50mg  daily. I Will add entresto, stop HCTZ, GDTM at follow-up. BMET in a week.   Nonobstructive CAD Recent cath showed nonobstructive CAD as above. Continue with risk factor management. Continue Aspirin, BB. Check lipids, likely needs statin initiation.   AI Mild to mod AI Arotic regurgitation, PHT measures 73ms.   SVT No further palpitations. Continue BB.    Ascending thoracic aortic anuerysm Aortic dilation noted, moderate dilation of the ascending aorta at 38mm on echo. Continue to monitor with annual imaging.   Disposition: Follow up in 1 month(s) with MD/APP  Signed, Carlicia Leavens Ninfa Meeker, PA-C  09/26/2021 9:28 PM    [facility]

## 2021-09-25 ENCOUNTER — Ambulatory Visit (INDEPENDENT_AMBULATORY_CARE_PROVIDER_SITE_OTHER): Payer: Commercial Managed Care - HMO | Admitting: Medical

## 2021-09-25 ENCOUNTER — Other Ambulatory Visit: Payer: Self-pay

## 2021-09-25 ENCOUNTER — Encounter: Payer: Self-pay | Admitting: Medical

## 2021-09-25 VITALS — BP 126/82 | HR 84 | Ht 63.0 in | Wt 173.0 lb

## 2021-09-25 DIAGNOSIS — I471 Supraventricular tachycardia: Secondary | ICD-10-CM | POA: Diagnosis not present

## 2021-09-25 DIAGNOSIS — Z79899 Other long term (current) drug therapy: Secondary | ICD-10-CM

## 2021-09-25 DIAGNOSIS — I428 Other cardiomyopathies: Secondary | ICD-10-CM | POA: Diagnosis not present

## 2021-09-25 DIAGNOSIS — I351 Nonrheumatic aortic (valve) insufficiency: Secondary | ICD-10-CM

## 2021-09-25 DIAGNOSIS — I2510 Atherosclerotic heart disease of native coronary artery without angina pectoris: Secondary | ICD-10-CM | POA: Diagnosis not present

## 2021-09-25 DIAGNOSIS — I5020 Unspecified systolic (congestive) heart failure: Secondary | ICD-10-CM | POA: Diagnosis not present

## 2021-09-25 MED ORDER — ENTRESTO 24-26 MG PO TABS: 1.0000 | ORAL_TABLET | Freq: Two times a day (BID) | ORAL | 11 refills | Status: DC

## 2021-09-25 NOTE — Patient Instructions (Addendum)
Medication Instructions:     STOP HCTZ  START Entresto  24/26 mg twice a day  *If you need a refill on your cardiac medications before your next appointment, please call your pharmacy*   Lab Work:  bmet in 1 week (1/20)  If you have labs (blood work) drawn today and your tests are completely normal, you will receive your results only by: [place] (if you have MyChart) OR A paper copy in the mail If you have any lab test that is abnormal or we need to change your treatment, we will call you to review the results.   Testing/Procedures: None    Follow-Up: At [facility], you and your health needs are our priority.  As part of our continuing mission to provide you with exceptional heart care, we have created designated Provider Care Teams.  These Care Teams include your primary Cardiologist (physician) and Advanced Practice Providers (APPs -  Physician Assistants and Nurse Practitioners) who all work together to provide you with the care you need, when you need it.  We recommend signing up for the patient portal called "MyChart".  Sign up information is provided on this After Visit Summary.  MyChart is used to connect with patients for Virtual Visits (Telemedicine).  Patients are able to view lab/test results, encounter notes, upcoming appointments, etc.  Non-urgent messages can be sent to your provider as well.   To learn more about what you can do with MyChart, go to [email or web address].    Your next appointment:   3 week(s)  The format for your next appointment:   In Person  Provider:   You may see Rozann Lesches, MD or one of the following Advanced Practice Providers on your designated Care Team:   Bernerd Pho, PA-C  Ermalinda Barrios, PA-C     Other Instructions None

## 2021-10-20 ENCOUNTER — Other Ambulatory Visit: Payer: Self-pay | Admitting: Cardiology

## 2021-10-20 ENCOUNTER — Other Ambulatory Visit: Payer: Self-pay | Admitting: *Deleted

## 2021-10-20 DIAGNOSIS — K219 Gastro-esophageal reflux disease without esophagitis: Secondary | ICD-10-CM | POA: Diagnosis not present

## 2021-10-20 DIAGNOSIS — Z0000 Encounter for general adult medical examination without abnormal findings: Secondary | ICD-10-CM | POA: Diagnosis not present

## 2021-10-20 DIAGNOSIS — I5022 Chronic systolic (congestive) heart failure: Secondary | ICD-10-CM | POA: Diagnosis not present

## 2021-10-20 MED ORDER — METOPROLOL SUCCINATE ER 50 MG PO TB24: 50.0000 mg | ORAL_TABLET | Freq: Every day | ORAL | 1 refills | Status: DC

## 2021-10-29 ENCOUNTER — Ambulatory Visit: Payer: Medicare Other | Admitting: Student

## 2021-10-29 NOTE — Progress Notes (Unsigned)
Cardiology Office Note    Date:  10/29/2021   ID:  Michelle Zavala, DOB 11-Apr-1946, MRN [id number]  PCP:  Neale Burly, MD  Cardiologist: Rozann Lesches, MD    No chief complaint on file.   History of Present Illness:    Michelle Zavala is a 76 y.o. female with past medical history of CAD (mild plaque by catheterization in 2010, cath in 09/2021 showing mild to moderate nonobstructive CAD with medical management of NICM recommended), HFrEF (EF 35-40% in 08/2021), pSVT, HTN, HLD, ascending thoracic aortic aneurysm (at 4.2 cm by CT in 06/2021), diverticulosis, GERD and COPD who presents to the office today for 3-week follow-up.  She was last examined by Cadence Furth, PA-C in 09/2021 for follow-up for her recent cardiac catheterization and reported overall doing well at that time and denied any recent chest pain or dyspnea on exertion.  She was continued on Toprol-XL 50 mg daily and was started on Entresto 24-26 mg twice daily with HCTZ being discontinued.  - FLP, BMET  Past Medical History:  Diagnosis Date   Anxiety    Arthritis    C2 cervical fracture ([place]) 09/17/2013   Traumatic fracture witth minimal displacement   Cardiomyopathy ([place])    a. EF 35-40% by echo in 08/2021   Chronic lung disease    Fibrosis - Dr. Koleen Nimrod   COPD (chronic obstructive pulmonary disease) ([place])    Coronary atherosclerosis of native coronary artery    a. Mild atherosclerosis by cath in 2010 b. NST in 07/2021 showing fixed defects but EF at 41% by NST and 35-40% by echo   Depression    Diverticulosis    Essential hypertension    GERD (gastroesophageal reflux disease)    PSVT (paroxysmal supraventricular tachycardia) ([place])     Past Surgical History:  Procedure Laterality Date   ABDOMINAL HYSTERECTOMY     ANTERIOR RELEASE VERTEBRAL BODY W/ POSTERIOR FUSION     BACTERIAL OVERGROWTH TEST N/A 02/19/2016   Procedure: BACTERIAL OVERGROWTH TEST;  Surgeon: Daneil Dolin, MD;  [facility];   Service: Endoscopy;  Laterality: N/A;  0700   BIOPSY N/A 08/04/2015   Procedure: BIOPSY;  Surgeon: Daneil Dolin, MD;  Location: [facility];  Service: Endoscopy;  Laterality: N/A;  Gastric   BIOPSY  02/21/2017   Procedure: BIOPSY;  Surgeon: Daneil Dolin, MD;  [facility];  Service: Endoscopy;;  ascending colon biopsy    BIOPSY  08/21/2020   Procedure: BIOPSY;  Surgeon: Daneil Dolin, MD;  [facility];  Service: Endoscopy;;   cataract surgery     [place]   COLONOSCOPY  June 2016   Dr. Britta Mccreedy: moderate diverticulosis in sigmoid colon, surveillance in 5 years    COLONOSCOPY WITH PROPOFOL N/A 02/21/2017   Dr. Gala Romney: diverticulosis in sigmoid colon, tubular adenomas, segmental biopsies benign. Surveillance in 5 years    ESOPHAGOGASTRODUODENOSCOPY (EGD) WITH PROPOFOL N/A 08/04/2015   Dr. Rourk:abnormal gastric mucosa s/p biopsy. Reactive gastropathy. Negative H.pylori   ESOPHAGOGASTRODUODENOSCOPY (EGD) WITH PROPOFOL N/A 02/21/2017   Dr. Gala Romney: empiric esophageal dilatation, small hiatal hernia, otherwise normal   ESOPHAGOGASTRODUODENOSCOPY (EGD) WITH PROPOFOL N/A 08/21/2020   normal esophagus s/p dilation. Erythematous mucosa in stomach of doubtful clinical significant s/p biopsy. Negative H.pylori. Mild reactive gastropathy.    INCISIONAL HERNIA REPAIR N/A 12/03/2015   Procedure: [name] HERNIORRHAPHY WITH MESH;  Surgeon: Aviva Signs, MD;  Location: [facility];  Service: General;  Laterality: N/A;   INSERTION OF MESH  12/03/2015   Procedure: INSERTION OF MESH;  Surgeon: Aviva Signs, MD;  Location: [facility];  Service: General;;   IR RADIOLOGIST EVAL & MGMT  09/22/2017   MALONEY DILATION N/A 02/21/2017   Procedure: [name] DILATION;  Surgeon: Daneil Dolin, MD;  [facility];  Service: Endoscopy;  Laterality: N/A;   MALONEY DILATION N/A 08/21/2020   Procedure: [name] DILATION;  Surgeon: Daneil Dolin, MD;  [facility];   Service: Endoscopy;  Laterality: N/A;   POLYPECTOMY  02/21/2017   Procedure: POLYPECTOMY;  Surgeon: Daneil Dolin, MD;  [facility];  Service: Endoscopy;;  hepatic flexure polyp cs   RIGHT/LEFT HEART CATH AND CORONARY ANGIOGRAPHY N/A 09/17/2021   Procedure: RIGHT/LEFT HEART CATH AND CORONARY ANGIOGRAPHY;  Surgeon: Nelva Bush, MD;  Location: [facility];  Service: Cardiovascular;  Laterality: N/A;   TMJ ARTHROSCOPY Bilateral 02/04/2015   Procedure: BILATERAL TEMPOROMANDIBULAR JOINT (TMJ) ARTHROSCOPY MENISECTOMY WITH FAT GRAFT FROM ABDOMEN ;  Surgeon: Diona Browner, DDS;  Location: [facility];  Service: Oral Surgery;  Laterality: Bilateral;   TOTAL KNEE ARTHROPLASTY Bilateral    TUBAL LIGATION      Current Medications: Outpatient Medications Prior to Visit  Medication Sig Dispense Refill   acetaminophen (TYLENOL) 650 MG CR tablet Take 1,300 mg by mouth every 8 (eight) hours as needed for pain.      albuterol (VENTOLIN HFA) 108 (90 Base) MCG/ACT inhaler Inhale 2 puffs into the lungs every 4 (four) hours as needed for wheezing or shortness of breath.     amitriptyline (ELAVIL) 75 MG tablet Take 75 mg by mouth at bedtime.  6   Ascorbic Acid (VITAMIN C) 1000 MG tablet Take 1,000 mg by mouth daily.     aspirin EC 81 MG tablet Take 1 tablet (81 mg total) by mouth daily. Swallow whole. (Patient not taking: Reported on 09/25/2021)     Budeson-Glycopyrrol-Formoterol (BREZTRI AEROSPHERE) 160-9-4.8 MCG/ACT AERO Inhale 2 puffs into the lungs in the morning and at bedtime. (Patient taking differently: Inhale 2 puffs into the lungs 2 (two) times daily as needed (shortness of breath).) 10.7 g 0   cholecalciferol (VITAMIN D3) 25 MCG (1000 UNIT) tablet Take 1,000 Units by mouth daily.     ipratropium (ATROVENT) 0.02 % nebulizer solution Take 0.5 mg by nebulization every 4 (four) hours as needed for wheezing or shortness of breath.     LORazepam (ATIVAN) 1 MG tablet Take 1 mg by mouth 2 (two)  times daily.     magnesium hydroxide (MILK OF MAGNESIA) 400 MG/5ML suspension Take 30 mLs by mouth daily as needed for mild constipation.     methocarbamol (ROBAXIN) 500 MG tablet Take 500 mg by mouth 2 (two) times daily.     metoprolol succinate (TOPROL-XL) 50 MG 24 hr tablet Take 1 tablet (50 mg total) by mouth daily. Take with or immediately following a meal. 90 tablet 1   Omega-3 Fatty Acids (FISH OIL) 1000 MG CAPS Take 3,000 mg by mouth daily.      pantoprazole (PROTONIX) 40 MG tablet Take 1 tablet (40 mg total) by mouth 2 (two) times daily. 60 tablet 1   potassium chloride SA (KLOR-CON M) 20 MEQ tablet Take 2 tablets (40 mEq total) by mouth daily. 180 tablet 3   sacubitril-valsartan (ENTRESTO) 24-26 MG Take 1 tablet by mouth 2 (two) times daily. 60 tablet 11   sertraline (ZOLOFT) 100 MG tablet Take 100  mg by mouth daily.     Sodium Phosphates (ENEMA RE) Place rectally. As needed     vitamin B-12 (CYANOCOBALAMIN) 1000 MCG tablet Take 1,000 mcg by mouth daily.     No facility-administered medications prior to visit.     Allergies:   Sulfonamide derivatives   Social History   Socioeconomic History   Marital status: Widowed    Spouse name: Not on file   Number of children: 3   Years of education: 7   Highest education level: Not on file  Occupational History   Occupation: Retired  Tobacco Use   Smoking status: Never   Smokeless tobacco: Never  Vaping Use   Vaping Use: Never used  Substance and Sexual Activity   Alcohol use: No    Alcohol/week: 0.0 standard drinks   Drug use: No   Sexual activity: Never    Birth control/protection: None, Surgical  Other Topics Concern   Not on file  Social History Narrative   Occasionally drinks [employer]    Social Determinants of Health   Financial Resource Strain: Not on file  Food Insecurity: Not on file  Transportation Needs: Not on file  Physical Activity: Not on file  Stress: Not on file  Social Connections: Not on file      Family History:  The patient's ***family history includes Colon cancer in her son; Coronary artery disease in her brother, father, and sister; Heart disease in her father.   Review of Systems:    Please see the history of present illness.     All other systems reviewed and are otherwise negative except as noted above.   Physical Exam:    VS:  There were no vitals taken for this visit.   General: Well developed, well nourished,female appearing in no acute distress. Head: Normocephalic, atraumatic. Neck: No carotid bruits. JVD not elevated.  Lungs: Respirations regular and unlabored, without wheezes or rales.  Heart: ***Regular rate and rhythm. No S3 or S4.  No murmur, no rubs, or gallops appreciated. Abdomen: Appears non-distended. No obvious abdominal masses. Msk:  Strength and tone appear normal for age. No obvious joint deformities or effusions. Extremities: No clubbing or cyanosis. No edema.  Distal pedal pulses are 2+ bilaterally. Neuro: Alert and oriented X 3. Moves all extremities spontaneously. No focal deficits noted. Psych:  Responds to questions appropriately with a normal affect. Skin: No rashes or lesions noted  Wt Readings from Last 3 Encounters:  09/25/21 173 lb (78.5 kg)  09/17/21 174 lb (78.9 kg)  09/10/21 174 lb (78.9 kg)        Studies/Labs Reviewed:   EKG:  EKG is*** ordered today.  The ekg ordered today demonstrates ***  Recent Labs: 09/10/2021: Platelets 249 09/17/2021: BUN 15; Creatinine, Ser 0.77; Hemoglobin 11.2; Potassium 3.5; Sodium 141   Lipid Panel    Component Value Date/Time   CHOL 223 (H) 09/23/2020 1454   TRIG 220 (H) 09/23/2020 1454   HDL 57 09/23/2020 1454   CHOLHDL 3.9 09/23/2020 1454   LDLCALC 129 (H) 09/23/2020 1454    Additional studies/ records that were reviewed today include:   Echocardiogram: 08/2021 IMPRESSIONS     1. Left ventricular ejection fraction, by estimation, is 35 to 40%. The  left ventricle has  moderately decreased function. The left ventricle  demonstrates regional wall motion abnormalities (see scoring  diagram/findings for description). Left ventricular   diastolic parameters are consistent with Grade I diastolic dysfunction  (impaired relaxation). Abnormal global longitudinal strain of -9.8%.  2. Right ventricular systolic function is mildly reduced. The right  ventricular size is normal. There is normal pulmonary artery systolic  pressure. The estimated right ventricular systolic pressure is 67.6 mmHg.   3. Left atrial size was moderately dilated.   4. The mitral valve is abnormal. Trivial mitral valve regurgitation.   5. The aortic valve is tricuspid. Aortic valve regurgitation is mild to  moderate. Aortic regurgitation PHT measures 772 msec.   6. Aortic dilatation noted. There is moderate dilatation of the ascending  aorta, measuring 44 mm.   7. The inferior vena cava is normal in size with greater than 50%  respiratory variability, suggesting right atrial pressure of 3 mmHg.   L/RHC: 09/2021 Conclusions: Mild-moderate, non-obstructive coronary artery disease, including 20% eccentric LMCA stenosis, sequential 40% mid and distal LAD stenoses, 30% OM1 and OM2 lesions, 60% LPL1 stenosis, and 30% proximal RCA stenosis. Mildly elevated left heart, right heart, and pulmonary artery pressures. Mildly reduced Fick cardiac output/index.   Recommendations: Escalate goal directed medical therapy for management of non-ischemic cardiomyopathy, as tolerated. Consider escalation of diuresis. Medical therapy and risk factor modification to prevent progression of non-obstructive coronary artery disease.    Assessment:    No diagnosis found.   Plan:   In order of problems listed above:  ***    Shared Decision Making/Informed Consent:   {Are you ordering a CV Procedure (e.g. stress test, cath, DCCV, TEE, etc)?   Press F2        :[id number]}    Medication Adjustments/Labs  and Tests Ordered: Current medicines are reviewed at length with the patient today.  Concerns regarding medicines are outlined above.  Medication changes, Labs and Tests ordered today are listed in the Patient Instructions below. There are no Patient Instructions on file for this visit.   Signed, Erma Heritage, PA-C  10/29/2021 7:45 AM    [facility] [address] [place], [place] [address] Phone: [phone] Fax: [phone]

## 2021-11-17 ENCOUNTER — Other Ambulatory Visit: Payer: Self-pay

## 2021-11-17 ENCOUNTER — Encounter (INDEPENDENT_AMBULATORY_CARE_PROVIDER_SITE_OTHER): Payer: Medicare Other | Admitting: Ophthalmology

## 2021-11-17 DIAGNOSIS — H43813 Vitreous degeneration, bilateral: Secondary | ICD-10-CM

## 2021-11-17 DIAGNOSIS — H35033 Hypertensive retinopathy, bilateral: Secondary | ICD-10-CM | POA: Diagnosis not present

## 2021-11-17 DIAGNOSIS — M7061 Trochanteric bursitis, right hip: Secondary | ICD-10-CM | POA: Diagnosis not present

## 2021-11-17 DIAGNOSIS — H2703 Aphakia, bilateral: Secondary | ICD-10-CM

## 2021-11-17 DIAGNOSIS — H353111 Nonexudative age-related macular degeneration, right eye, early dry stage: Secondary | ICD-10-CM | POA: Diagnosis not present

## 2021-11-17 DIAGNOSIS — D3132 Benign neoplasm of left choroid: Secondary | ICD-10-CM | POA: Diagnosis not present

## 2021-11-17 DIAGNOSIS — H353122 Nonexudative age-related macular degeneration, left eye, intermediate dry stage: Secondary | ICD-10-CM

## 2021-11-17 DIAGNOSIS — I10 Essential (primary) hypertension: Secondary | ICD-10-CM

## 2021-11-17 DIAGNOSIS — M7581 Other shoulder lesions, right shoulder: Secondary | ICD-10-CM | POA: Diagnosis not present

## 2021-11-25 ENCOUNTER — Ambulatory Visit: Payer: Medicare Other | Admitting: Gastroenterology

## 2021-12-07 DIAGNOSIS — R42 Dizziness and giddiness: Secondary | ICD-10-CM | POA: Diagnosis not present

## 2021-12-07 DIAGNOSIS — J329 Chronic sinusitis, unspecified: Secondary | ICD-10-CM | POA: Diagnosis not present

## 2021-12-08 DIAGNOSIS — H81392 Other peripheral vertigo, left ear: Secondary | ICD-10-CM | POA: Diagnosis not present

## 2021-12-10 DIAGNOSIS — I130 Hypertensive heart and chronic kidney disease with heart failure and stage 1 through stage 4 chronic kidney disease, or unspecified chronic kidney disease: Secondary | ICD-10-CM | POA: Diagnosis not present

## 2021-12-10 DIAGNOSIS — I509 Heart failure, unspecified: Secondary | ICD-10-CM | POA: Diagnosis not present

## 2021-12-10 DIAGNOSIS — E876 Hypokalemia: Secondary | ICD-10-CM | POA: Diagnosis not present

## 2021-12-10 DIAGNOSIS — N183 Chronic kidney disease, stage 3 unspecified: Secondary | ICD-10-CM | POA: Diagnosis not present

## 2021-12-10 DIAGNOSIS — R2689 Other abnormalities of gait and mobility: Secondary | ICD-10-CM | POA: Diagnosis not present

## 2021-12-10 DIAGNOSIS — M5030 Other cervical disc degeneration, unspecified cervical region: Secondary | ICD-10-CM | POA: Diagnosis not present

## 2021-12-10 DIAGNOSIS — Z20822 Contact with and (suspected) exposure to covid-19: Secondary | ICD-10-CM | POA: Diagnosis not present

## 2021-12-10 DIAGNOSIS — S0990XA Unspecified injury of head, initial encounter: Secondary | ICD-10-CM | POA: Diagnosis not present

## 2021-12-10 DIAGNOSIS — J9811 Atelectasis: Secondary | ICD-10-CM | POA: Diagnosis not present

## 2021-12-10 DIAGNOSIS — W1839XA Other fall on same level, initial encounter: Secondary | ICD-10-CM | POA: Diagnosis not present

## 2021-12-10 DIAGNOSIS — R42 Dizziness and giddiness: Secondary | ICD-10-CM | POA: Diagnosis not present

## 2021-12-10 DIAGNOSIS — R55 Syncope and collapse: Secondary | ICD-10-CM | POA: Diagnosis not present

## 2021-12-10 DIAGNOSIS — Z043 Encounter for examination and observation following other accident: Secondary | ICD-10-CM | POA: Diagnosis not present

## 2021-12-10 DIAGNOSIS — S199XXA Unspecified injury of neck, initial encounter: Secondary | ICD-10-CM | POA: Diagnosis not present

## 2021-12-10 DIAGNOSIS — J439 Emphysema, unspecified: Secondary | ICD-10-CM | POA: Diagnosis not present

## 2021-12-10 DIAGNOSIS — Z981 Arthrodesis status: Secondary | ICD-10-CM | POA: Diagnosis not present

## 2021-12-11 DIAGNOSIS — R42 Dizziness and giddiness: Secondary | ICD-10-CM | POA: Diagnosis not present

## 2021-12-11 DIAGNOSIS — R531 Weakness: Secondary | ICD-10-CM | POA: Diagnosis not present

## 2021-12-11 DIAGNOSIS — R55 Syncope and collapse: Secondary | ICD-10-CM | POA: Diagnosis not present

## 2021-12-12 DIAGNOSIS — R112 Nausea with vomiting, unspecified: Secondary | ICD-10-CM | POA: Diagnosis not present

## 2021-12-12 DIAGNOSIS — R55 Syncope and collapse: Secondary | ICD-10-CM | POA: Diagnosis not present

## 2021-12-12 DIAGNOSIS — R42 Dizziness and giddiness: Secondary | ICD-10-CM | POA: Diagnosis not present

## 2021-12-13 DIAGNOSIS — D72829 Elevated white blood cell count, unspecified: Secondary | ICD-10-CM | POA: Diagnosis not present

## 2021-12-13 DIAGNOSIS — W19XXXD Unspecified fall, subsequent encounter: Secondary | ICD-10-CM | POA: Diagnosis not present

## 2021-12-13 DIAGNOSIS — N183 Chronic kidney disease, stage 3 unspecified: Secondary | ICD-10-CM | POA: Diagnosis not present

## 2021-12-13 DIAGNOSIS — E876 Hypokalemia: Secondary | ICD-10-CM | POA: Diagnosis not present

## 2021-12-13 DIAGNOSIS — R112 Nausea with vomiting, unspecified: Secondary | ICD-10-CM | POA: Diagnosis not present

## 2021-12-13 DIAGNOSIS — R42 Dizziness and giddiness: Secondary | ICD-10-CM | POA: Diagnosis not present

## 2021-12-13 DIAGNOSIS — R55 Syncope and collapse: Secondary | ICD-10-CM | POA: Diagnosis not present

## 2021-12-13 DIAGNOSIS — R002 Palpitations: Secondary | ICD-10-CM | POA: Diagnosis not present

## 2021-12-16 DIAGNOSIS — J309 Allergic rhinitis, unspecified: Secondary | ICD-10-CM | POA: Diagnosis not present

## 2021-12-16 DIAGNOSIS — R55 Syncope and collapse: Secondary | ICD-10-CM | POA: Diagnosis not present

## 2021-12-16 DIAGNOSIS — I5022 Chronic systolic (congestive) heart failure: Secondary | ICD-10-CM | POA: Diagnosis not present

## 2021-12-23 DIAGNOSIS — I77810 Thoracic aortic ectasia: Secondary | ICD-10-CM | POA: Diagnosis not present

## 2021-12-23 DIAGNOSIS — J309 Allergic rhinitis, unspecified: Secondary | ICD-10-CM | POA: Diagnosis not present

## 2021-12-23 DIAGNOSIS — I083 Combined rheumatic disorders of mitral, aortic and tricuspid valves: Secondary | ICD-10-CM | POA: Diagnosis not present

## 2021-12-23 DIAGNOSIS — I5022 Chronic systolic (congestive) heart failure: Secondary | ICD-10-CM | POA: Diagnosis not present

## 2021-12-24 ENCOUNTER — Emergency Department (HOSPITAL_COMMUNITY): Payer: Medicare Other

## 2021-12-24 ENCOUNTER — Encounter (HOSPITAL_COMMUNITY): Payer: Self-pay

## 2021-12-24 ENCOUNTER — Emergency Department (HOSPITAL_COMMUNITY)
Admission: EM | Admit: 2021-12-24 | Discharge: 2021-12-24 | Disposition: A | Discharge status: Home / Self Care | Payer: Medicare Other | Attending: Emergency Medicine | Admitting: Emergency Medicine

## 2021-12-24 ENCOUNTER — Other Ambulatory Visit: Payer: Self-pay

## 2021-12-24 DIAGNOSIS — M546 Pain in thoracic spine: Secondary | ICD-10-CM | POA: Insufficient documentation

## 2021-12-24 DIAGNOSIS — Z7982 Long term (current) use of aspirin: Secondary | ICD-10-CM | POA: Insufficient documentation

## 2021-12-24 DIAGNOSIS — M545 Low back pain, unspecified: Secondary | ICD-10-CM | POA: Diagnosis not present

## 2021-12-24 DIAGNOSIS — Z7951 Long term (current) use of inhaled steroids: Secondary | ICD-10-CM | POA: Insufficient documentation

## 2021-12-24 DIAGNOSIS — M4126 Other idiopathic scoliosis, lumbar region: Secondary | ICD-10-CM | POA: Diagnosis not present

## 2021-12-24 DIAGNOSIS — E876 Hypokalemia: Secondary | ICD-10-CM | POA: Diagnosis not present

## 2021-12-24 DIAGNOSIS — M533 Sacrococcygeal disorders, not elsewhere classified: Secondary | ICD-10-CM | POA: Diagnosis not present

## 2021-12-24 DIAGNOSIS — J449 Chronic obstructive pulmonary disease, unspecified: Secondary | ICD-10-CM | POA: Insufficient documentation

## 2021-12-24 DIAGNOSIS — M542 Cervicalgia: Secondary | ICD-10-CM | POA: Insufficient documentation

## 2021-12-24 DIAGNOSIS — I10 Essential (primary) hypertension: Secondary | ICD-10-CM | POA: Insufficient documentation

## 2021-12-24 DIAGNOSIS — M549 Dorsalgia, unspecified: Secondary | ICD-10-CM

## 2021-12-24 DIAGNOSIS — Z79899 Other long term (current) drug therapy: Secondary | ICD-10-CM | POA: Insufficient documentation

## 2021-12-24 MED ORDER — CYCLOBENZAPRINE HCL 10 MG PO TABS: 10.0000 mg | ORAL_TABLET | Freq: Two times a day (BID) | ORAL | 0 refills | Status: DC | PRN

## 2021-12-24 MED ORDER — CYCLOBENZAPRINE HCL 10 MG PO TABS
10.0000 mg | ORAL_TABLET | Freq: Once | ORAL | Status: AC
Start: 2021-12-24 — End: 2021-12-24
  Administered 2021-12-24: 10 mg via ORAL
  Filled 2021-12-24: qty 1

## 2021-12-24 MED ORDER — LIDOCAINE 5 % EX PTCH: 1.0000 | MEDICATED_PATCH | CUTANEOUS | 0 refills | Status: DC

## 2021-12-24 NOTE — ED Notes (Signed)
Patient transported to X-ray 

## 2021-12-24 NOTE — ED Triage Notes (Signed)
Patient with complaints of neck and back pain after fall 2 weeks while being seen in the ER at [facility].  ?

## 2021-12-24 NOTE — Discharge Instructions (Signed)
Please take Flexeril as needed for muscle spasm.  Please use Lidoderm patches as needed for your back pain.  Please return to the emergency department for any worsening symptoms. ?

## 2021-12-24 NOTE — ED Provider Notes (Signed)
?[facility] ?Provider Note ? ? ?CSN: [id number] ?Arrival date & time: 12/24/21  1306 ? ?  ? ?History ?Chief Complaint  ?Patient presents with  ? Back Pain  ? ? ?Michelle Zavala is a 76 y.o. female with history of cervical fusion back in 2015 who presents the emergency department with neck and back pain has been ongoing for 2 weeks and she fell.  Patient was initially seen and evaluated within the [facility] system and admitted for a fall and syncope.  Patient had imaging of the head and neck which was negative for any acute fractures or intracranial hemorrhages.  Patient also received  a chest x-ray which was negative.  Patient was ultimately discharged home and follow-up with her primary care who expressed that she was still in pain and she states that nothing was done about that.  Presents now in the ED for worsening pain and spasms in her back.  No imaging was done of the thoracic or lumbar spine at that time.  No new injury or falls. ? ? ?Back Pain ? ?  ? ?Home Medications ?Prior to Admission medications   ?Medication Sig Start Date End Date Taking? Authorizing Provider  ?cyclobenzaprine (FLEXERIL) 10 MG tablet Take 1 tablet (10 mg total) by mouth 2 (two) times daily as needed for muscle spasms. 12/24/21  Yes Zaira Iacovelli M, PA-C  ?lidocaine (LIDODERM) 5 % Place 1 patch onto the skin daily. Remove & Discard patch within 12 hours or as directed by MD 12/24/21  Yes Myna Bright M, PA-C  ?acetaminophen (TYLENOL) 650 MG CR tablet Take 1,300 mg by mouth every 8 (eight) hours as needed for pain.     [provider]  ?albuterol (VENTOLIN HFA) 108 (90 Base) MCG/ACT inhaler Inhale 2 puffs into the lungs every 4 (four) hours as needed for wheezing or shortness of breath.    [provider]  ?amitriptyline (ELAVIL) 75 MG tablet Take 75 mg by mouth at bedtime. 01/27/15   [provider]  ?Ascorbic Acid (VITAMIN C) 1000 MG tablet Take 1,000 mg by mouth daily.    [provider]   ?aspirin EC 81 MG tablet Take 1 tablet (81 mg total) by mouth daily. Swallow whole. ?Patient not taking: Reported on 09/25/2021 08/10/21   Satira Sark, MD  ?Budeson-Glycopyrrol-Formoterol (BREZTRI AEROSPHERE) 160-9-4.8 MCG/ACT AERO Inhale 2 puffs into the lungs in the morning and at bedtime. ?Patient taking differently: Inhale 2 puffs into the lungs 2 (two) times daily as needed (shortness of breath). 12/09/20   Chesley Mires, MD  ?cholecalciferol (VITAMIN D3) 25 MCG (1000 UNIT) tablet Take 1,000 Units by mouth daily.    [provider]  ?ipratropium (ATROVENT) 0.02 % nebulizer solution Take 0.5 mg by nebulization every 4 (four) hours as needed for wheezing or shortness of breath.    [provider]  ?LORazepam (ATIVAN) 1 MG tablet Take 1 mg by mouth 2 (two) times daily.    [provider]  ?magnesium hydroxide (MILK OF MAGNESIA) 400 MG/5ML suspension Take 30 mLs by mouth daily as needed for mild constipation.    [provider]  ?methocarbamol (ROBAXIN) 500 MG tablet Take 500 mg by mouth 2 (two) times daily. 09/02/21   [provider]  ?metoprolol succinate (TOPROL-XL) 50 MG 24 hr tablet Take 1 tablet (50 mg total) by mouth daily. Take with or immediately following a meal. 10/20/21   Satira Sark, MD  ?Omega-3 Fatty Acids (FISH OIL) 1000 MG CAPS Take  3,000 mg by mouth daily.     [provider]  ?pantoprazole (PROTONIX) 40 MG tablet Take 1 tablet (40 mg total) by mouth 2 (two) times daily. 09/28/13   Angiulli, Lavon Paganini, PA-C  ?potassium chloride SA (KLOR-CON M) 20 MEQ tablet Take 2 tablets (40 mEq total) by mouth daily. 09/15/21   Strader, Fransisco Hertz, PA-C  ?sacubitril-valsartan (ENTRESTO) 24-26 MG Take 1 tablet by mouth 2 (two) times daily. 09/25/21   Furth, Cadence H, PA-C  ?sertraline (ZOLOFT) 100 MG tablet Take 100 mg by mouth daily.    [provider]  ?Sodium Phosphates (ENEMA RE) Place rectally. As needed    [provider]   ?vitamin B-12 (CYANOCOBALAMIN) 1000 MCG tablet Take 1,000 mcg by mouth daily.    [provider]  ?   ? ?Allergies    ?Sulfonamide derivatives   ? ?Review of Systems   ?Review of Systems  ?Musculoskeletal:  Positive for back pain.  ?All other systems reviewed and are negative. ? ?Physical Exam ?Updated Vital Signs ?BP (!) 112/92 (BP Location: Right Arm)   Pulse 89   Temp 97.7 ?F (36.5 ?C) (Oral)   Resp 15   Ht '5\' 3"'$  (1.6 m)   Wt 74.8 kg   SpO2 100%   BMI 29.23 kg/m?  ?Physical Exam ?Vitals and nursing note reviewed.  ?Constitutional:   ?   Appearance: Normal appearance.  ?HENT:  ?   Head: Normocephalic and atraumatic.  ?Eyes:  ?   General:     ?   Right eye: No discharge.     ?   Left eye: No discharge.  ?   Conjunctiva/sclera: Conjunctivae normal.  ?Neck:  ?   Comments: Well-healed vertical cervical scar.  Minimal tenderness over the cervical spine. ?Pulmonary:  ?   Effort: Pulmonary effort is normal.  ?Musculoskeletal:  ?   Comments: 5/5 strength to the upper and lower extremities.  Normal sensation to the upper and lower extremities.  There is mild tenderness to the thoracic and lumbar spine diffusely.  ?Skin: ?   General: Skin is warm and dry.  ?   Findings: No rash.  ?Neurological:  ?   General: No focal deficit present.  ?   Mental Status: She is alert.  ?Psychiatric:     ?   Mood and Affect: Mood normal.     ?   Behavior: Behavior normal.  ? ? ?ED Results / Procedures / Treatments   ?Labs ?(all labs ordered are listed, but only abnormal results are displayed) ?Labs Reviewed - No data to display ? ?EKG ?None ? ?Radiology ?DG Thoracic Spine W/Swimmers ? ?Result Date: 12/24/2021 ?CLINICAL DATA:  Upper back pain, fell 2 weeks ago EXAM: THORACIC SPINE - 3 VIEWS COMPARISON:  CT abdomen and pelvis 09/27/2019 FINDINGS: Osseous demineralization. Prior cervical spine fusion C5-C7. 12 pairs of ribs. Biconvex thoracolumbar scoliosis. Compression deformity of superior endplate of O75 with retropulsion of  fragment and to a lesser degree superior endplate compression fracture of T11, both appearing unchanged. No additional fracture, subluxation, or bone destruction. Atherosclerotic calcifications aortic arch. IMPRESSION: Osseous demineralization with mild thoracolumbar scoliosis and chronic compression fractures of T12 and to a lesser degree T11. No acute thoracic spine abnormalities. Aortic Atherosclerosis (ICD10-I70.0). Electronically Signed   By: Lavonia Dana M.D.   On: 12/24/2021 14:58  ? ?DG Lumbar Spine Complete ? ?Result Date: 12/24/2021 ?CLINICAL DATA:  Low back and tailbone pain post fall 2 weeks ago, pain runs down LEFT side  EXAM: LUMBAR SPINE - COMPLETE 4+ VIEW COMPARISON:  CT abdomen and pelvis 09/27/2019 FINDINGS: Five non-rib-bearing lumbar vertebra. Biconvex thoracolumbar scoliosis. Chronic compression fractures of T11 and T12, thoracic spine reported separately. Lumbar vertebral body heights maintained with scattered disc space narrowing noted. No fracture, subluxation, or bone destruction within lumbar spine. SI joints symmetric. Atherosclerotic calcifications aorta. IMPRESSION: Osseous demineralization with degenerative changes and scoliosis of lumbar spine. Chronic compression fractures of T11 and T12. No acute osseous abnormalities. Aortic Atherosclerosis (ICD10-I70.0). Electronically Signed   By: Lavonia Dana M.D.   On: 12/24/2021 15:02  ? ?DG Sacrum/Coccyx ? ?Result Date: 12/24/2021 ?CLINICAL DATA:  Low back and tailbone pain post fall 2 weeks ago, pain runs on LEFT side EXAM: SACRUM AND COCCYX - 2+ VIEW COMPARISON:  None FINDINGS: Osseous demineralization. SI joint spaces preserved. No definite sacrococcygeal fractures identified. No bone destruction. IMPRESSION: No acute sacrococcygeal abnormalities identified. Electronically Signed   By: Lavonia Dana M.D.   On: 12/24/2021 15:01   ? ?Procedures ?Procedures  ? ? ?Medications Ordered in ED ?Medications  ?cyclobenzaprine (FLEXERIL) tablet 10 mg (10 mg  Oral Given 12/24/21 1416)  ? ? ?ED Course/ Medical Decision Making/ A&P ?Clinical Course as of 12/24/21 1542  ?Thu Dec 24, 2021  ?1533 I discussed this case with my attending physician who cosigned this note includin

## 2021-12-28 ENCOUNTER — Telehealth: Payer: Self-pay | Admitting: Cardiology

## 2021-12-28 NOTE — Telephone Encounter (Signed)
Pain underneath left breast x 1 - 1 1/2 weeks now.  Did see her pcp last Thursday.  Was given a pain shot by pcp 3-4 days ago.  States she did get some relief afterwards then it come back.  Was in hospital about 3 weeks ago for vertigo - SOB not any different from the way it is always, does have COPD.  States that she did fall flat on her back while in the hospital.  No c/o n/v.  Suggested to patient that she may need to be seen by pcp, urgent care or ED but will send message to provider for clarification.   ?

## 2021-12-28 NOTE — Telephone Encounter (Signed)
Pt c/o of Chest Pain: STAT if CP now or developed within 24 hours ? ?1. Are you having CP right now?  ?Yes- but more like a discomfort, it is radiating into her back and hurts to bend over in her back.  ? ?2. Are you experiencing any other symptoms (ex. SOB, nausea, vomiting, sweating)?  ? No other symptoms ? ?3. How long have you been experiencing CP?  ?A week and a half- maybe longer. She fell in the ER about 2 weeks ago at [facility] ? ?4. Is your CP continuous or coming and going?   ? It's continuous  ? ?5. Have you taken Nitroglycerin?  ? No  ?? ?Please call [phone] ?

## 2021-12-29 NOTE — Telephone Encounter (Signed)
Left message to return call 

## 2021-12-30 ENCOUNTER — Encounter: Payer: Self-pay | Admitting: Cardiology

## 2021-12-30 ENCOUNTER — Ambulatory Visit (INDEPENDENT_AMBULATORY_CARE_PROVIDER_SITE_OTHER): Payer: Medicare Other | Admitting: Cardiology

## 2021-12-30 VITALS — BP 117/70 | HR 108 | Ht 63.0 in | Wt 172.6 lb

## 2021-12-30 DIAGNOSIS — I2510 Atherosclerotic heart disease of native coronary artery without angina pectoris: Secondary | ICD-10-CM

## 2021-12-30 DIAGNOSIS — I471 Supraventricular tachycardia: Secondary | ICD-10-CM | POA: Diagnosis not present

## 2021-12-30 DIAGNOSIS — I428 Other cardiomyopathies: Secondary | ICD-10-CM | POA: Diagnosis not present

## 2021-12-30 DIAGNOSIS — Z79899 Other long term (current) drug therapy: Secondary | ICD-10-CM | POA: Diagnosis not present

## 2021-12-30 DIAGNOSIS — I5020 Unspecified systolic (congestive) heart failure: Secondary | ICD-10-CM | POA: Diagnosis not present

## 2021-12-30 MED ORDER — DIGOXIN 125 MCG PO TABS: 0.0625 mg | ORAL_TABLET | Freq: Every day | ORAL | 3 refills | Status: DC

## 2021-12-30 MED ORDER — MIDODRINE HCL 2.5 MG PO TABS
2.5000 mg | ORAL_TABLET | Freq: Two times a day (BID) | ORAL | 3 refills | Status: DC
Start: 2021-12-30 — End: 2022-01-28

## 2021-12-30 MED ORDER — MAGNESIUM OXIDE 400 MG PO TABS: 400.0000 mg | ORAL_TABLET | Freq: Every day | ORAL | 6 refills | Status: DC

## 2021-12-30 MED ORDER — SPIRONOLACTONE 25 MG PO TABS: 12.5000 mg | ORAL_TABLET | Freq: Every day | ORAL | 6 refills | Status: DC

## 2021-12-30 NOTE — Patient Instructions (Addendum)
Medication Instructions:  ?Your physician has recommended you make the following change in your medication:  ?Start magnesium 400 mg daily ?Start spironolactone 12.5 mg daily ?Start digoxin 0.0625 mg daily ?Start midodrine 2.5 mg twice daily ?Continue other medications the same ? ?Labwork: ?BMET today or tomorrow ?Non-fasting ? ?Testing/Procedures: ?none ? ?Follow-Up: ?Your physician recommends that you schedule a follow-up appointment in: 1 month ? ?Any Other Special Instructions Will Be Listed Below (If Applicable). ? ?If you need a refill on your cardiac medications before your next appointment, please call your pharmacy. ?

## 2021-12-30 NOTE — Progress Notes (Signed)
? ? ?Cardiology Office Note ? ?Date: 12/30/2021  ? ?ID: Michelle Zavala, DOB 1946/03/26, MRN [id number] ? ?PCP:  Neale Burly, MD  ?Cardiologist:  Rozann Lesches, MD ?Electrophysiologist:  None  ? ?Chief Complaint  ?Patient presents with  ? Cardiac follow-up  ? ? ?History of Present Illness: ?Michelle Zavala is a 76 y.o. female last seen in January by Ms. Michelle Zavala.  She is here for a follow-up visit. Record review finds evaluation at [facility] in March with apparent syncope and diagnosis of vertigo.  Lab work is noted below.  She states that she has felt weak, intermittently lightheaded.  No leg swelling.  Her weight has been relatively stable. ? ?She was started on Entresto with discontinuation of HCTZ at last visit.  She did not tolerate Entresto however due to significant weakness and lightheadedness, stopped the medication. ? ?She had a recent echocardiogram in April done at [facility], noted below with overall stable LVEF in the range of 35 to 40%. ? ?I personally reviewed her ECG today which shows sinus rhythm with leftward axis and increased voltage, poor R wave progression. ? ?I reviewed her medications. ? ?She was found to be mildly orthostatic today by both blood pressure and heart rate. ? ?Past Medical History:  ?Diagnosis Date  ? Anxiety   ? Arthritis   ? C2 cervical fracture ([place]) 09/17/2013  ? Traumatic fracture witth minimal displacement  ? Cardiomyopathy ([place])   ? a. EF 35-40% by echo in 08/2021  ? Chronic lung disease   ? Fibrosis - Dr. Koleen Nimrod  ? COPD (chronic obstructive pulmonary disease) ([place])   ? Coronary atherosclerosis of native coronary artery   ? a. Mild atherosclerosis by cath in 2010 b. NST in 07/2021 showing fixed defects but EF at 41% by NST and 35-40% by echo  ? Depression   ? Diverticulosis   ? Essential hypertension   ? GERD (gastroesophageal reflux disease)   ? PSVT (paroxysmal supraventricular tachycardia) ([place])   ? ? ?Past Surgical History:  ?Procedure Laterality Date  ?  ABDOMINAL HYSTERECTOMY    ? ANTERIOR RELEASE VERTEBRAL BODY W/ POSTERIOR FUSION    ? BACTERIAL OVERGROWTH TEST N/A 02/19/2016  ? Procedure: BACTERIAL OVERGROWTH TEST;  Surgeon: Daneil Dolin, MD;  [facility];  Service: Endoscopy;  Laterality: N/A;  0700  ? BIOPSY N/A 08/04/2015  ? Procedure: BIOPSY;  Surgeon: Daneil Dolin, MD;  [facility];  Service: Endoscopy;  Laterality: N/A;  Gastric  ? BIOPSY  02/21/2017  ? Procedure: BIOPSY;  Surgeon: Daneil Dolin, MD;  [facility];  Service: Endoscopy;;  ascending colon biopsy   ? BIOPSY  08/21/2020  ? Procedure: BIOPSY;  Surgeon: Daneil Dolin, MD;  [facility];  Service: Endoscopy;;  ? cataract surgery    ? CESAREAN SECTION    ? CHOLECYSTECTOMY  1973  ? COLONOSCOPY  June 2016  ? Dr. Britta Mccreedy: moderate diverticulosis in sigmoid colon, surveillance in 5 years   ? COLONOSCOPY WITH PROPOFOL N/A 02/21/2017  ? Dr. Gala Romney: diverticulosis in sigmoid colon, tubular adenomas, segmental biopsies benign. Surveillance in 5 years   ? ESOPHAGOGASTRODUODENOSCOPY (EGD) WITH PROPOFOL N/A 08/04/2015  ? Dr. Rourk:abnormal gastric mucosa s/p biopsy. Reactive gastropathy. Negative H.pylori  ? ESOPHAGOGASTRODUODENOSCOPY (EGD) WITH PROPOFOL N/A 02/21/2017  ? Dr. Gala Romney: empiric esophageal dilatation, small hiatal hernia, otherwise normal  ? ESOPHAGOGASTRODUODENOSCOPY (EGD) WITH PROPOFOL N/A 08/21/2020  ? normal esophagus s/p dilation. Erythematous mucosa in stomach of  doubtful clinical significant s/p biopsy. Negative H.pylori. Mild reactive gastropathy.   ? INCISIONAL HERNIA REPAIR N/A 12/03/2015  ? Procedure: INCISIONAL HERNIORRHAPHY WITH MESH;  Surgeon: Aviva Signs, MD;  [facility];  Service: General;  Laterality: N/A;  ? INSERTION OF MESH  12/03/2015  ? Procedure: INSERTION OF MESH;  Surgeon: Aviva Signs, MD;  [facility];  Service: General;;  ? IR RADIOLOGIST EVAL & MGMT  09/22/2017  ? MALONEY DILATION N/A 02/21/2017  ? Procedure: MALONEY  DILATION;  Surgeon: Daneil Dolin, MD;  [facility];  Service: Endoscopy;  Laterality: N/A;  ? MALONEY DILATION N/A 08/21/2020  ? Procedure: MALONEY DILATION;  Surgeon: Daneil Dolin, MD;  [facility];  Service: Endoscopy;  Laterality: N/A;  ? POLYPECTOMY  02/21/2017  ? Procedure: POLYPECTOMY;  Surgeon: Daneil Dolin, MD;  [facility];  Service: Endoscopy;;  hepatic flexure polyp cs  ? RIGHT/LEFT HEART CATH AND CORONARY ANGIOGRAPHY N/A 09/17/2021  ? Procedure: RIGHT/LEFT HEART CATH AND CORONARY ANGIOGRAPHY;  Surgeon: Nelva Bush, MD;  Location: [place] [facility];  Service: Cardiovascular;  Laterality: N/A;  ? TMJ ARTHROSCOPY Bilateral 02/04/2015  ? Procedure: BILATERAL TEMPOROMANDIBULAR JOINT (TMJ) ARTHROSCOPY MENISECTOMY WITH FAT GRAFT FROM ABDOMEN ;  Surgeon: Diona Browner, DDS;  Location: [place];  Service: Oral Surgery;  Laterality: Bilateral;  ? TOTAL KNEE ARTHROPLASTY Bilateral   ? TUBAL LIGATION    ? ? ?Current Outpatient Medications  ?Medication Sig Dispense Refill  ? acetaminophen (TYLENOL) 650 MG CR tablet Take 1,300 mg by mouth every 8 (eight) hours as needed for pain.     ? albuterol (VENTOLIN HFA) 108 (90 Base) MCG/ACT inhaler Inhale 2 puffs into the lungs every 4 (four) hours as needed for wheezing or shortness of breath.    ? amitriptyline (ELAVIL) 75 MG tablet Take 75 mg by mouth at bedtime.  6  ? Ascorbic Acid (VITAMIN C) 1000 MG tablet Take 1,000 mg by mouth daily.    ? Budeson-Glycopyrrol-Formoterol (BREZTRI AEROSPHERE) 160-9-4.8 MCG/ACT AERO Inhale 2 puffs into the lungs in the morning and at bedtime. (Patient taking differently: Inhale 2 puffs into the lungs 2 (two) times daily as needed (shortness of breath).) 10.7 g 0  ? cholecalciferol (VITAMIN D3) 25 MCG (1000 UNIT) tablet Take 1,000 Units by mouth daily.    ? cyclobenzaprine (FLEXERIL) 10 MG tablet Take 1 tablet (10 mg total) by mouth 2 (two) times daily as needed for muscle spasms. 20 tablet 0  ?  digoxin (LANOXIN) 0.125 MG tablet Take 0.5 tablets (0.0625 mg total) by mouth daily. 15 tablet 3  ? ipratropium (ATROVENT) 0.02 % nebulizer solution Take 0.5 mg by nebulization every 4 (four) hours as needed for wheezing or shortness of breath.    ? LORazepam (ATIVAN) 1 MG tablet Take 1 mg by mouth 2 (two) times daily.    ? magnesium oxide (MAG-OX) 400 MG tablet Take 1 tablet (400 mg total) by mouth daily. 30 tablet 6  ? metoprolol succinate (TOPROL-XL) 50 MG 24 hr tablet Take 1 tablet (50 mg total) by mouth daily. Take with or immediately following a meal. 90 tablet 1  ? midodrine (PROAMATINE) 2.5 MG tablet Take 1 tablet (2.5 mg total) by mouth 2 (two) times daily with a meal. 60 tablet 3  ? Omega-3 Fatty Acids (FISH OIL) 1000 MG CAPS Take 3,000 mg by mouth daily.     ? pantoprazole (PROTONIX) 40 MG tablet Take 1 tablet (40 mg total)  by mouth 2 (two) times daily. 60 tablet 1  ? potassium chloride SA (KLOR-CON M) 20 MEQ tablet Take 2 tablets (40 mEq total) by mouth daily. 180 tablet 3  ? sertraline (ZOLOFT) 100 MG tablet Take 100 mg by mouth daily.    ? Sodium Phosphates (ENEMA RE) Place rectally. As needed    ? spironolactone (ALDACTONE) 25 MG tablet Take 0.5 tablets (12.5 mg total) by mouth daily. 15 tablet 6  ? vitamin B-12 (CYANOCOBALAMIN) 1000 MCG tablet Take 1,000 mcg by mouth daily.    ? ?No current facility-administered medications for this visit.  ? ?Allergies:  Sulfonamide derivatives  ? ?ROS: No orthopnea or PND. ? ?Physical Exam: ?VS:  BP 117/70 (BP Location: Right Arm, Cuff Size: Normal)   Pulse (!) 108   Ht '5\' 3"'$  (1.6 m)   Wt 172 lb 9.6 oz (78.3 kg)   SpO2 99%   BMI 30.57 kg/m? , BMI Body mass index is 30.57 kg/m?. ? ?Wt Readings from Last 3 Encounters:  ?12/30/21 172 lb 9.6 oz (78.3 kg)  ?12/24/21 165 lb (74.8 kg)  ?09/25/21 173 lb (78.5 kg)  ?  ?General: Patient appears comfortable at rest. ?HEENT: Conjunctiva and lids normal. ?Neck: Supple, no elevated JVP or carotid bruits, no  thyromegaly. ?Lungs: Clear to auscultation, nonlabored breathing at rest. ?Cardiac: Regular rate and rhythm, no S3 or significant systolic murmur. ?Extremities: No pitting edema. ? ?ECG:  An ECG dated 09/25/2021 was personally reviewe

## 2021-12-31 NOTE — Telephone Encounter (Signed)
Patient had OV yesterday with Dr. Domenic Polite.   ?

## 2022-01-05 DIAGNOSIS — R0789 Other chest pain: Secondary | ICD-10-CM | POA: Diagnosis not present

## 2022-01-05 DIAGNOSIS — I5022 Chronic systolic (congestive) heart failure: Secondary | ICD-10-CM | POA: Diagnosis not present

## 2022-01-08 ENCOUNTER — Ambulatory Visit: Payer: Medicare Other | Admitting: Student

## 2022-01-10 DIAGNOSIS — I700 Atherosclerosis of aorta: Secondary | ICD-10-CM | POA: Diagnosis not present

## 2022-01-10 DIAGNOSIS — I5022 Chronic systolic (congestive) heart failure: Secondary | ICD-10-CM | POA: Diagnosis not present

## 2022-01-10 DIAGNOSIS — K219 Gastro-esophageal reflux disease without esophagitis: Secondary | ICD-10-CM | POA: Diagnosis not present

## 2022-01-10 DIAGNOSIS — I10 Essential (primary) hypertension: Secondary | ICD-10-CM | POA: Diagnosis not present

## 2022-01-12 ENCOUNTER — Telehealth: Payer: Self-pay | Admitting: Cardiology

## 2022-01-12 NOTE — Telephone Encounter (Signed)
Patient walks in had a bad night last night asking to be seen. She is having chest pain when she inhales . She said the pain is also constant. Some SOB as well.  ?

## 2022-01-12 NOTE — Telephone Encounter (Signed)
Patient last seen 12/30/2021.  Was started on Midodrine, Magnesium, Digoxin, & Spironolactone.  Only started the Magnesium & did not really have answer as to why she did not begin all of them.  Complain of chest pain 4/10 - area under left breast x 2-3 days.  Nausea occasionally & dizzy off / on x 1 week.  She c/o that the chest pain was really bad last night but did not go to ED.  Has not taken any Nitroglycerin - "just didn't think about it".  SOB does seem to be a little more than usual but states she does have COPD.  Also, mentions that she fell a while back while at the hospital - questioned if she had seen her pcp since seeing [place] - states she was given muscle relaxer, but has taken all they give her.   ? ?BP check in office -  118/84  67  98% ? ?Discussed above with Dr. Domenic Polite - per provider need to begin all new medications as prescribed as this may help to alleviate some of these symptoms.  Also, suggested that she call her pcp back if needing more of the muscle relaxer.  Reminded her of her f/u visit scheduled for 01/28/2022 with Dr. Domenic Polite in [place] office.  In the meantime, if symptoms worsen should go to ED for evaluation.  She verbalized understanding.    ?

## 2022-01-18 ENCOUNTER — Encounter: Payer: Self-pay | Admitting: *Deleted

## 2022-01-19 DIAGNOSIS — Z0000 Encounter for general adult medical examination without abnormal findings: Secondary | ICD-10-CM | POA: Diagnosis not present

## 2022-01-19 DIAGNOSIS — K219 Gastro-esophageal reflux disease without esophagitis: Secondary | ICD-10-CM | POA: Diagnosis not present

## 2022-01-19 DIAGNOSIS — I5022 Chronic systolic (congestive) heart failure: Secondary | ICD-10-CM | POA: Diagnosis not present

## 2022-01-19 DIAGNOSIS — I10 Essential (primary) hypertension: Secondary | ICD-10-CM | POA: Diagnosis not present

## 2022-01-19 DIAGNOSIS — I700 Atherosclerosis of aorta: Secondary | ICD-10-CM | POA: Diagnosis not present

## 2022-01-25 DIAGNOSIS — T148XXA Other injury of unspecified body region, initial encounter: Secondary | ICD-10-CM | POA: Diagnosis not present

## 2022-01-25 DIAGNOSIS — L821 Other seborrheic keratosis: Secondary | ICD-10-CM | POA: Diagnosis not present

## 2022-01-28 ENCOUNTER — Ambulatory Visit (INDEPENDENT_AMBULATORY_CARE_PROVIDER_SITE_OTHER): Payer: Medicare Other | Admitting: Cardiology

## 2022-01-28 ENCOUNTER — Encounter: Payer: Self-pay | Admitting: Cardiology

## 2022-01-28 VITALS — BP 158/100 | HR 85 | Ht 63.0 in | Wt 170.4 lb

## 2022-01-28 DIAGNOSIS — Z79899 Other long term (current) drug therapy: Secondary | ICD-10-CM

## 2022-01-28 DIAGNOSIS — I428 Other cardiomyopathies: Secondary | ICD-10-CM | POA: Diagnosis not present

## 2022-01-28 DIAGNOSIS — I5020 Unspecified systolic (congestive) heart failure: Secondary | ICD-10-CM | POA: Diagnosis not present

## 2022-01-28 MED ORDER — DIGOXIN 125 MCG PO TABS: 0.1250 mg | ORAL_TABLET | Freq: Every day | ORAL | 6 refills | Status: DC

## 2022-01-28 MED ORDER — SPIRONOLACTONE 25 MG PO TABS: 12.5000 mg | ORAL_TABLET | Freq: Every day | ORAL | 6 refills | Status: DC

## 2022-01-28 MED ORDER — METOPROLOL SUCCINATE ER 50 MG PO TB24: 75.0000 mg | ORAL_TABLET | Freq: Every day | ORAL | 1 refills | Status: DC

## 2022-01-28 NOTE — Patient Instructions (Addendum)
Medication Instructions:  Your physician has recommended you make the following change in your medication:  Hold off on taking midodrine Start digoxin 0.125 mg daily Start spironolactone 12.5 mg daily Increase metoprolol succinate to 75 mg daily Continue other medications the same  Labwork: BMET in 2 weeks 02/11/2022  Testing/Procedures: none  Follow-Up: Your physician recommends that you schedule a follow-up appointment in: 6 weeks Your physician recommends that you schedule a follow-up appointment in: 2 weeks for nurse visit to check vital signs  Any Other Special Instructions Will Be Listed Below (If Applicable). Please take your medications, which is the only way that will make difference in how you are doing and help your symptoms.  If you need a refill on your cardiac medications before your next appointment, please call your pharmacy.

## 2022-01-28 NOTE — Progress Notes (Signed)
Cardiology Office Note  Date: 01/28/2022   ID: Jalilah Wiltsie, DOB [birth year], MRN [id number]  PCP:  Neale Burly, MD  Cardiologist:  Rozann Lesches, MD Electrophysiologist:  None   Chief Complaint  Patient presents with   Cardiac follow-up    History of Present Illness: Atlantis Delong is a 76 y.o. female last seen in April.  She is here today with a friend for a follow-up visit.  She continues to complain of a musculoskeletal sounding left lower thoracic discomfort that is noncardiac in description.  She had a heart catheterization in January of this year showing only mild to moderate nonobstructive disease with a concurrent diagnosis of nonischemic cardiomyopathy.  Unfortunately, she has not taken any of the medications that we prescribed at her last visit.  She states that she is confused about what to take.  We placed a call to her pharmacy.  For now our plan for her cardiac regimen will be Lipitor 40 mg daily, Toprol-XL at 75 mg daily, Aldactone 12.5 mg daily, and digoxin 0.125 mg daily.  We will hold off on ProAmatine given increase in blood pressure in the interim.  I did talk with her about the importance of being consistent with medical therapy if we are going to make any positive improvements in her cardiac status.  Past Medical History:  Diagnosis Date   Anxiety    Arthritis    C2 cervical fracture ([place]) 09/17/2013   Traumatic fracture witth minimal displacement   Cardiomyopathy ([place])    a. EF 35-40% by echo in 08/2021   Chronic lung disease    Fibrosis - Dr. Koleen Nimrod   COPD (chronic obstructive pulmonary disease) ([place])    Coronary atherosclerosis of native coronary artery    a. Mild atherosclerosis by cath in 2010 b. NST in 07/2021 showing fixed defects but EF at 41% by NST and 35-40% by echo   Depression    Diverticulosis    Essential hypertension    GERD (gastroesophageal reflux disease)    PSVT (paroxysmal supraventricular tachycardia) ([place])     Past Surgical  History:  Procedure Laterality Date   ABDOMINAL HYSTERECTOMY     ANTERIOR RELEASE VERTEBRAL BODY W/ POSTERIOR FUSION     BACTERIAL OVERGROWTH TEST N/A 02/19/2016   Procedure: BACTERIAL OVERGROWTH TEST;  Surgeon: Daneil Dolin, MD;  [facility];  Service: Endoscopy;  Laterality: N/A;  0700   BIOPSY N/A 08/04/2015   Procedure: BIOPSY;  Surgeon: Daneil Dolin, MD;  [facility];  Service: Endoscopy;  Laterality: N/A;  Gastric   BIOPSY  02/21/2017   Procedure: BIOPSY;  Surgeon: Daneil Dolin, MD;  [facility];  Service: Endoscopy;;  ascending colon biopsy    BIOPSY  08/21/2020   Procedure: BIOPSY;  Surgeon: Daneil Dolin, MD;  [facility];  Service: Endoscopy;;   cataract surgery     [place]   COLONOSCOPY  June 2016   Dr. Britta Mccreedy: moderate diverticulosis in sigmoid colon, surveillance in 5 years    COLONOSCOPY WITH PROPOFOL N/A 02/21/2017   Dr. Gala Romney: diverticulosis in sigmoid colon, tubular adenomas, segmental biopsies benign. Surveillance in 5 years    ESOPHAGOGASTRODUODENOSCOPY (EGD) WITH PROPOFOL N/A 08/04/2015   Dr. Rourk:abnormal gastric mucosa s/p biopsy. Reactive gastropathy. Negative H.pylori   ESOPHAGOGASTRODUODENOSCOPY (EGD) WITH PROPOFOL N/A 02/21/2017   Dr. Gala Romney: empiric esophageal dilatation, small hiatal hernia, otherwise normal   ESOPHAGOGASTRODUODENOSCOPY (EGD) WITH PROPOFOL N/A  08/21/2020   normal esophagus s/p dilation. Erythematous mucosa in stomach of doubtful clinical significant s/p biopsy. Negative H.pylori. Mild reactive gastropathy.    INCISIONAL HERNIA REPAIR N/A 12/03/2015   Procedure: [name] HERNIORRHAPHY WITH MESH;  Surgeon: Aviva Signs, MD;  [facility];  Service: General;  Laterality: N/A;   INSERTION OF MESH  12/03/2015   Procedure: INSERTION OF MESH;  Surgeon: Aviva Signs, MD;  [facility];  Service: General;;   IR RADIOLOGIST EVAL & MGMT  09/22/2017   MALONEY DILATION  N/A 02/21/2017   Procedure: [name] DILATION;  Surgeon: Daneil Dolin, MD;  [facility];  Service: Endoscopy;  Laterality: N/A;   MALONEY DILATION N/A 08/21/2020   Procedure: [name] DILATION;  Surgeon: Daneil Dolin, MD;  [facility];  Service: Endoscopy;  Laterality: N/A;   POLYPECTOMY  02/21/2017   Procedure: POLYPECTOMY;  Surgeon: Daneil Dolin, MD;  [facility];  Service: Endoscopy;;  hepatic flexure polyp cs   RIGHT/LEFT HEART CATH AND CORONARY ANGIOGRAPHY N/A 09/17/2021   Procedure: RIGHT/LEFT HEART CATH AND CORONARY ANGIOGRAPHY;  Surgeon: Nelva Bush, MD;  Location: [place] [facility];  Service: Cardiovascular;  Laterality: N/A;   TMJ ARTHROSCOPY Bilateral 02/04/2015   Procedure: BILATERAL TEMPOROMANDIBULAR JOINT (TMJ) ARTHROSCOPY MENISECTOMY WITH FAT GRAFT FROM ABDOMEN ;  Surgeon: Diona Browner, DDS;  Location: [facility] [facility];  Service: Oral Surgery;  Laterality: Bilateral;   TOTAL KNEE ARTHROPLASTY Bilateral    TUBAL LIGATION      Current Outpatient Medications  Medication Sig Dispense Refill   acetaminophen (TYLENOL) 650 MG CR tablet Take 1,300 mg by mouth every 8 (eight) hours as needed for pain.      albuterol (VENTOLIN HFA) 108 (90 Base) MCG/ACT inhaler Inhale 2 puffs into the lungs every 4 (four) hours as needed for wheezing or shortness of breath.     amitriptyline (ELAVIL) 75 MG tablet Take 75 mg by mouth at bedtime.  6   Ascorbic Acid (VITAMIN C) 1000 MG tablet Take 1,000 mg by mouth daily.     atorvastatin (LIPITOR) 40 MG tablet Take 40 mg by mouth daily.     cholecalciferol (VITAMIN D3) 25 MCG (1000 UNIT) tablet Take 1,000 Units by mouth daily.     ipratropium (ATROVENT) 0.02 % nebulizer solution Take 0.5 mg by nebulization every 4 (four) hours as needed for wheezing or shortness of breath.     LORazepam (ATIVAN) 1 MG tablet Take 1 mg by mouth 2 (two) times daily.     magnesium oxide (MAG-OX) 400 MG tablet Take 1 tablet (400 mg total) by  mouth daily. 30 tablet 6   Omega-3 Fatty Acids (FISH OIL) 1000 MG CAPS Take 3,000 mg by mouth daily.      pantoprazole (PROTONIX) 40 MG tablet Take 1 tablet (40 mg total) by mouth 2 (two) times daily. 60 tablet 1   potassium chloride SA (KLOR-CON M) 20 MEQ tablet Take 2 tablets (40 mEq total) by mouth daily. 180 tablet 3   sertraline (ZOLOFT) 100 MG tablet Take 100 mg by mouth daily.     Sodium Phosphates (ENEMA RE) Place rectally. As needed     vitamin B-12 (CYANOCOBALAMIN) 1000 MCG tablet Take 1,000 mcg by mouth daily.     digoxin (LANOXIN) 0.125 MG tablet Take 1 tablet (0.125 mg total) by mouth daily. 30 tablet 6   metoprolol succinate (TOPROL-XL) 50 MG 24 hr tablet Take 1.5 tablets (75 mg total) by mouth daily. Take with  or immediately following a meal. 135 tablet 1   spironolactone (ALDACTONE) 25 MG tablet Take 0.5 tablets (12.5 mg total) by mouth daily. 15 tablet 6   No current facility-administered medications for this visit.   Allergies:  Sulfonamide derivatives   ROS: No syncope.  Physical Exam: VS:  BP (!) 158/100   Pulse 85   Ht '5\' 3"'$  (1.6 m)   Wt 170 lb 6.4 oz (77.3 kg)   SpO2 99%   BMI 30.19 kg/m , BMI Body mass index is 30.19 kg/m.  Wt Readings from Last 3 Encounters:  01/28/22 170 lb 6.4 oz (77.3 kg)  12/30/21 172 lb 9.6 oz (78.3 kg)  12/24/21 165 lb (74.8 kg)    General: Patient appears comfortable at rest. HEENT: Conjunctiva and lids normal. Neck: Supple, no elevated JVP or carotid bruits, no thyromegaly. Lungs: Clear to auscultation, nonlabored breathing at rest. Cardiac: Regular rate and rhythm, no S3 or significant systolic murmur, no pericardial rub. Extremities: No pitting edema.  ECG:  An ECG dated 12/30/2021 was personally reviewed today and demonstrated:  Sinus rhythm with leftward axis and increased voltage, poor R wave progression.  Recent Labwork: 09/10/2021: Platelets 249 09/17/2021: BUN 15; Creatinine, Ser 0.77; Hemoglobin 11.2; Potassium 3.5;  Sodium 141     Component Value Date/Time   CHOL 223 (H) 09/23/2020 1454   TRIG 220 (H) 09/23/2020 1454   HDL 57 09/23/2020 1454   CHOLHDL 3.9 09/23/2020 1454   LDLCALC 129 (H) 09/23/2020 1454  April 2023: Potassium 3.4, BUN 15, creatinine 1.19  Other Studies Reviewed Today:  Cardiac catheterization 09/17/2021: Conclusions: Mild-moderate, non-obstructive coronary artery disease, including 20% eccentric LMCA stenosis, sequential 40% mid and distal LAD stenoses, 30% OM1 and OM2 lesions, 60% LPL1 stenosis, and 30% proximal RCA stenosis. Mildly elevated left heart, right heart, and pulmonary artery pressures. Mildly reduced Fick cardiac output/index.   Recommendations: Escalate goal directed medical therapy for management of non-ischemic cardiomyopathy, as tolerated. Consider escalation of diuresis. Medical therapy and risk factor modification to prevent progression of non-obstructive coronary artery disease.   Echocardiogram 12/23/2021 [facility]): Summary    1. The left ventricle is mildly dilated in size with mildly increased wall  thickness.    2. The left ventricular systolic function is mildly to moderately decreased,  LVEF is visually estimated at 35-40%.    3. There is grade I diastolic dysfunction (impaired relaxation).    4. There is mild mitral valve regurgitation.    5. The aortic valve is trileaflet with mildly thickened leaflets with normal  excursion.    6. There is mild to moderate aortic regurgitation.    7. The left atrium is mildly to moderately dilated in size.    8. The right ventricle is normal in size, with normal systolic function.    9. IVC size and inspiratory change suggest mildly elevated right atrial  pressure. (5-10 mmHg).   Assessment and Plan:  1.  HFrEF with nonischemic cardiomyopathy, LVEF 35 to 40%.  She had mild to moderate nonobstructive CAD at cardiac catheterization in January.  Unfortunately, she has not been consistent with regular  medical therapy and we have not been able to uptitrate GDMT.  We clarified her medications with the pharmacy.  Explained the situation in detail including her current regimen which will be Toprol-XL up to 75 mg daily, digoxin 0.625 mg daily, and Aldactone 12.5 mg daily.  Holding off on midodrine given increase in blood pressure in the interim.  She did not tolerate  Entresto previously due to dizziness, we might revisit this depending on how she does and her compliance.  Plan nurse visit with vital sign check in a few weeks.  BMET as well at that time.  2.  History of PSVT without obvious recurrences.  Medication Adjustments/Labs and Tests Ordered: Current medicines are reviewed at length with the patient today.  Concerns regarding medicines are outlined above.   Tests Ordered: Orders Placed This Encounter  Procedures   Basic metabolic panel    Medication Changes: Meds ordered this encounter  Medications   metoprolol succinate (TOPROL-XL) 50 MG 24 hr tablet    Sig: Take 1.5 tablets (75 mg total) by mouth daily. Take with or immediately following a meal.    Dispense:  135 tablet    Refill:  1    01/28/2022 dose increase   digoxin (LANOXIN) 0.125 MG tablet    Sig: Take 1 tablet (0.125 mg total) by mouth daily.    Dispense:  30 tablet    Refill:  6    01/28/22 NEW-increase from previous   spironolactone (ALDACTONE) 25 MG tablet    Sig: Take 0.5 tablets (12.5 mg total) by mouth daily.    Dispense:  15 tablet    Refill:  6    05/09/20/2021 NEW-restart    Disposition:  Follow up  6 weeks.  Signed, Satira Sark, MD, [facility] 01/28/2022 3:40 PM    [facility] [place], [place], [place] [address] Phone: [phone]; Fax: [phone]

## 2022-02-09 DIAGNOSIS — M7061 Trochanteric bursitis, right hip: Secondary | ICD-10-CM | POA: Diagnosis not present

## 2022-02-09 DIAGNOSIS — M7062 Trochanteric bursitis, left hip: Secondary | ICD-10-CM | POA: Diagnosis not present

## 2022-02-11 ENCOUNTER — Ambulatory Visit (INDEPENDENT_AMBULATORY_CARE_PROVIDER_SITE_OTHER): Payer: Medicare Other

## 2022-02-11 VITALS — BP 134/76 | HR 67 | Ht 63.0 in | Wt 167.2 lb

## 2022-02-11 DIAGNOSIS — I428 Other cardiomyopathies: Secondary | ICD-10-CM | POA: Diagnosis not present

## 2022-02-11 DIAGNOSIS — I5020 Unspecified systolic (congestive) heart failure: Secondary | ICD-10-CM | POA: Diagnosis not present

## 2022-02-11 NOTE — Progress Notes (Signed)
Patient did state that she was going to go to [facility] this morning and have her labs done.

## 2022-02-11 NOTE — Progress Notes (Signed)
Patient came in as a nurse visit to have vital signs checked.  Denies any symptoms. States that she is taking her medications as directed.

## 2022-02-18 ENCOUNTER — Telehealth: Payer: Self-pay | Admitting: *Deleted

## 2022-02-18 MED ORDER — SPIRONOLACTONE 25 MG PO TABS: 25.0000 mg | ORAL_TABLET | Freq: Every day | ORAL | 6 refills | Status: DC

## 2022-02-18 NOTE — Telephone Encounter (Signed)
Patient informed and verbalized understanding of plan. Copy sent to PCP 

## 2022-02-18 NOTE — Telephone Encounter (Signed)
-----   Message from Satira Sark, MD sent at 02/16/2022  2:36 PM EDT ----- Results reviewed.  Renal function remains normal with creatinine 0.95.  Potassium mildly decreased at 3.3.  Increase Aldactone to 25 mg daily.

## 2022-02-22 ENCOUNTER — Other Ambulatory Visit: Payer: Self-pay

## 2022-02-22 ENCOUNTER — Emergency Department (HOSPITAL_COMMUNITY): Payer: Medicare Other

## 2022-02-22 ENCOUNTER — Encounter (HOSPITAL_COMMUNITY): Payer: Self-pay | Admitting: Emergency Medicine

## 2022-02-22 ENCOUNTER — Emergency Department (HOSPITAL_COMMUNITY)
Admission: EM | Admit: 2022-02-22 | Discharge: 2022-02-22 | Disposition: A | Discharge status: Home / Self Care | Payer: Medicare Other | Attending: Emergency Medicine | Admitting: Emergency Medicine

## 2022-02-22 DIAGNOSIS — E876 Hypokalemia: Secondary | ICD-10-CM | POA: Diagnosis not present

## 2022-02-22 DIAGNOSIS — I129 Hypertensive chronic kidney disease with stage 1 through stage 4 chronic kidney disease, or unspecified chronic kidney disease: Secondary | ICD-10-CM | POA: Insufficient documentation

## 2022-02-22 DIAGNOSIS — W010XXA Fall on same level from slipping, tripping and stumbling without subsequent striking against object, initial encounter: Secondary | ICD-10-CM | POA: Diagnosis not present

## 2022-02-22 DIAGNOSIS — R059 Cough, unspecified: Secondary | ICD-10-CM | POA: Diagnosis not present

## 2022-02-22 DIAGNOSIS — Z7952 Long term (current) use of systemic steroids: Secondary | ICD-10-CM | POA: Diagnosis not present

## 2022-02-22 DIAGNOSIS — N189 Chronic kidney disease, unspecified: Secondary | ICD-10-CM | POA: Insufficient documentation

## 2022-02-22 DIAGNOSIS — Z79899 Other long term (current) drug therapy: Secondary | ICD-10-CM | POA: Insufficient documentation

## 2022-02-22 DIAGNOSIS — I2510 Atherosclerotic heart disease of native coronary artery without angina pectoris: Secondary | ICD-10-CM | POA: Insufficient documentation

## 2022-02-22 DIAGNOSIS — I779 Disorder of arteries and arterioles, unspecified: Secondary | ICD-10-CM | POA: Insufficient documentation

## 2022-02-22 DIAGNOSIS — J441 Chronic obstructive pulmonary disease with (acute) exacerbation: Secondary | ICD-10-CM | POA: Insufficient documentation

## 2022-02-22 DIAGNOSIS — S0990XA Unspecified injury of head, initial encounter: Secondary | ICD-10-CM | POA: Diagnosis not present

## 2022-02-22 DIAGNOSIS — Z20822 Contact with and (suspected) exposure to covid-19: Secondary | ICD-10-CM | POA: Insufficient documentation

## 2022-02-22 DIAGNOSIS — M4312 Spondylolisthesis, cervical region: Secondary | ICD-10-CM | POA: Diagnosis not present

## 2022-02-22 LAB — CBC
HCT: 38 % (ref 36.0–46.0)
Hemoglobin: 13.3 g/dL (ref 12.0–15.0)
MCH: 32.8 pg (ref 26.0–34.0)
MCHC: 35 g/dL (ref 30.0–36.0)
MCV: 93.6 fL (ref 80.0–100.0)
Platelets: 233 10*3/uL (ref 150–400)
RBC: 4.06 MIL/uL (ref 3.87–5.11)
RDW: 13 % (ref 11.5–15.5)
WBC: 14.3 10*3/uL — ABNORMAL HIGH (ref 4.0–10.5)
nRBC: 0 % (ref 0.0–0.2)

## 2022-02-22 LAB — COMPREHENSIVE METABOLIC PANEL
ALT: 17 U/L (ref 0–44)
AST: 16 U/L (ref 15–41)
Albumin: 3.6 g/dL (ref 3.5–5.0)
Alkaline Phosphatase: 51 U/L (ref 38–126)
Anion gap: 4 — ABNORMAL LOW (ref 5–15)
BUN: 25 mg/dL — ABNORMAL HIGH (ref 8–23)
CO2: 28 mmol/L (ref 22–32)
Calcium: 8.7 mg/dL — ABNORMAL LOW (ref 8.9–10.3)
Chloride: 102 mmol/L (ref 98–111)
Creatinine, Ser: 0.83 mg/dL (ref 0.44–1.00)
GFR, Estimated: >60 mL/min (ref >60)
Glucose, Bld: 129 mg/dL — ABNORMAL HIGH (ref 70–99)
Potassium: 2.8 mmol/L — ABNORMAL LOW (ref 3.5–5.1)
Sodium: 134 mmol/L — ABNORMAL LOW (ref 135–145)
Total Bilirubin: 0.7 mg/dL (ref 0.3–1.2)
Total Protein: 6.9 g/dL (ref 6.5–8.1)

## 2022-02-22 LAB — MAGNESIUM: Magnesium: 1.8 mg/dL (ref 1.7–2.4)

## 2022-02-22 LAB — SARS CORONAVIRUS 2 BY RT PCR: SARS Coronavirus 2 by RT PCR: NEGATIVE

## 2022-02-22 MED ORDER — IPRATROPIUM-ALBUTEROL 0.5-2.5 (3) MG/3ML IN SOLN
3.0000 mL | Freq: Once | RESPIRATORY_TRACT | Status: AC
  Administered 2022-02-22: 3 mL via RESPIRATORY_TRACT
  Filled 2022-02-22: qty 3

## 2022-02-22 MED ORDER — METHYLPREDNISOLONE SODIUM SUCC 125 MG IJ SOLR
125.0000 mg | Freq: Once | INTRAMUSCULAR | Status: AC
  Administered 2022-02-22: 125 mg via INTRAVENOUS
  Filled 2022-02-22: qty 2

## 2022-02-22 MED ORDER — POTASSIUM CHLORIDE 10 MEQ/100ML IV SOLN
10.0000 meq | INTRAVENOUS | Status: AC
  Administered 2022-02-22 (×4): 10 meq via INTRAVENOUS
  Filled 2022-02-22 (×4): qty 100

## 2022-02-22 MED ORDER — PREDNISONE 10 MG PO TABS: 40.0000 mg | ORAL_TABLET | Freq: Every day | ORAL | 0 refills | Status: AC

## 2022-02-22 MED ORDER — POTASSIUM CHLORIDE CRYS ER 20 MEQ PO TBCR
40.0000 meq | EXTENDED_RELEASE_TABLET | Freq: Once | ORAL | Status: AC
  Administered 2022-02-22: 40 meq via ORAL
  Filled 2022-02-22: qty 2

## 2022-02-22 MED ORDER — DOXYCYCLINE HYCLATE 100 MG PO CAPS: 100.0000 mg | ORAL_CAPSULE | Freq: Two times a day (BID) | ORAL | 0 refills | Status: AC

## 2022-02-22 MED ORDER — MAGNESIUM SULFATE IN D5W 1-5 GM/100ML-% IV SOLN
1.0000 g | Freq: Once | INTRAVENOUS | Status: AC
  Administered 2022-02-22: 1 g via INTRAVENOUS
  Filled 2022-02-22: qty 100

## 2022-02-22 MED ORDER — DOXYCYCLINE HYCLATE 100 MG PO TABS
100.0000 mg | ORAL_TABLET | Freq: Once | ORAL | Status: AC
  Administered 2022-02-22: 100 mg via ORAL
  Filled 2022-02-22: qty 1

## 2022-02-22 NOTE — ED Notes (Signed)
Dc instructions and scripts reviewed with pt no questions or concerns at this time.  

## 2022-02-22 NOTE — Discharge Instructions (Signed)
Medications were sent to your pharmacy for COPD exacerbation.  You received your initial doses of these medications in the ED today.  Take these medications as prescribed, starting tomorrow.  Continue breathing treatments at home as needed.  Follow-up with your primary care doctor to discuss management of your ongoing medical conditions.  Return to the emergency department at any time for any worsening of symptoms.

## 2022-02-22 NOTE — ED Provider Notes (Signed)
[facility] Provider Note   CSN: [id number] Arrival date & time: 02/22/22  1202     History  Chief Complaint  Patient presents with   Fall   Cough    Marilou Barnfield is a 76 y.o. female.   Fall  Cough Cough characteristics:  Productive Sputum characteristics:  [place] Severity:  Moderate Onset quality:  Gradual Duration:  5 days Timing:  Constant Progression:  Worsening Chronicity:  New Smoker: no   Relieved by:  Home nebulizer Worsened by:  Deep breathing Medical history includes HLD, HTN, CAD, PSVT, carotid artery disease, obesity, CKD, GERD, depression, anxiety, COPD and arthritis.  In addition to her cough, patient also had a fall 2 days ago.  She describes this as mechanical when she tripped over someone's pocketbook.  She fell backwards striking the back of her head on a concrete floor.  She did not lose consciousness.  She has not had any severe headaches, neurologic deficits, or problems with balance since the fall.  She does have history of C-spine injury and is worried that she may have caused a new injury.     Home Medications Prior to Admission medications   Medication Sig Start Date End Date Taking? Authorizing Provider  doxycycline (VIBRAMYCIN) 100 MG capsule Take 1 capsule (100 mg total) by mouth 2 (two) times daily for 4 days. Starting tomorrow 02/22/22 02/26/22 Yes Godfrey Pick, MD  predniSONE (DELTASONE) 10 MG tablet Take 4 tablets (40 mg total) by mouth daily for 4 days. Starting tomorrow 02/22/22 02/26/22 Yes Godfrey Pick, MD  acetaminophen (TYLENOL) 650 MG CR tablet Take 1,300 mg by mouth every 8 (eight) hours as needed for pain.     [provider]  albuterol (VENTOLIN HFA) 108 (90 Base) MCG/ACT inhaler Inhale 2 puffs into the lungs every 4 (four) hours as needed for wheezing or shortness of breath.    [provider]  amitriptyline (ELAVIL) 75 MG tablet Take 75 mg by mouth at bedtime. 01/27/15   [provider]   Ascorbic Acid (VITAMIN C) 1000 MG tablet Take 1,000 mg by mouth daily.    [provider]  atorvastatin (LIPITOR) 40 MG tablet Take 40 mg by mouth daily.    [provider]  cholecalciferol (VITAMIN D3) 25 MCG (1000 UNIT) tablet Take 1,000 Units by mouth daily.    [provider]  digoxin (LANOXIN) 0.125 MG tablet Take 1 tablet (0.125 mg total) by mouth daily. 01/28/22   Satira Sark, MD  ipratropium (ATROVENT) 0.02 % nebulizer solution Take 0.5 mg by nebulization every 4 (four) hours as needed for wheezing or shortness of breath.    [provider]  LORazepam (ATIVAN) 1 MG tablet Take 1 mg by mouth 2 (two) times daily.    [provider]  magnesium oxide (MAG-OX) 400 MG tablet Take 1 tablet (400 mg total) by mouth daily. 12/30/21   Satira Sark, MD  metoprolol succinate (TOPROL-XL) 50 MG 24 hr tablet Take 1.5 tablets (75 mg total) by mouth daily. Take with or immediately following a meal. 01/28/22   Satira Sark, MD  Omega-3 Fatty Acids (FISH OIL) 1000 MG CAPS Take 3,000 mg by mouth daily.     [provider]  pantoprazole (PROTONIX) 40 MG tablet Take 1 tablet (40 mg total) by mouth 2 (two) times daily. 09/28/13   Angiulli, Lavon Paganini, PA-C  potassium chloride SA (KLOR-CON M) 20 MEQ tablet Take 2 tablets (40 mEq total) by mouth daily.  09/15/21   Strader, Fransisco Hertz, PA-C  sertraline (ZOLOFT) 100 MG tablet Take 100 mg by mouth daily.    [provider]  Sodium Phosphates (ENEMA RE) Place rectally. As needed    [provider]  spironolactone (ALDACTONE) 25 MG tablet Take 1 tablet (25 mg total) by mouth daily. 02/18/22   Satira Sark, MD  vitamin B-12 (CYANOCOBALAMIN) 1000 MCG tablet Take 1,000 mcg by mouth daily.    [provider]      Allergies    Sulfonamide derivatives    Review of Systems   Review of Systems  Constitutional:  Positive for fatigue.  Respiratory:  Positive for cough.   All  other systems reviewed and are negative.   Physical Exam Updated Vital Signs BP (!) 145/96   Pulse 85   Temp 98 F (36.7 C) (Oral)   Resp (!) 22   Ht '5\' 3"'$  (1.6 m)   Wt 77.1 kg   SpO2 91%   BMI 30.11 kg/m  Physical Exam Vitals and nursing note reviewed.  Constitutional:      General: She is not in acute distress.    Appearance: Normal appearance. She is well-developed and normal weight. She is not ill-appearing, toxic-appearing or diaphoretic.  HENT:     Head: Normocephalic and atraumatic.     Right Ear: External ear normal.     Left Ear: External ear normal.     Nose: Nose normal.     Mouth/Throat:     Mouth: Mucous membranes are moist.     Pharynx: Oropharynx is clear.  Eyes:     Extraocular Movements: Extraocular movements intact.     Conjunctiva/sclera: Conjunctivae normal.  Cardiovascular:     Rate and Rhythm: Normal rate and regular rhythm.     Heart sounds: No murmur heard. Pulmonary:     Effort: Pulmonary effort is normal. No respiratory distress.     Breath sounds: Wheezing and rhonchi present.  Chest:     Chest wall: No tenderness.  Abdominal:     Palpations: Abdomen is soft.     Tenderness: There is no abdominal tenderness.  Musculoskeletal:        General: No swelling.     Cervical back: Normal range of motion and neck supple. No rigidity or tenderness.  Skin:    General: Skin is warm and dry.     Coloration: Skin is not jaundiced or pale.  Neurological:     General: No focal deficit present.     Mental Status: She is alert and oriented to person, place, and time.     Cranial Nerves: No cranial nerve deficit.     Sensory: No sensory deficit.     Motor: No weakness.     Coordination: Coordination normal.  Psychiatric:        Mood and Affect: Mood normal.        Behavior: Behavior normal.        Thought Content: Thought content normal.        Judgment: Judgment normal.     ED Results / Procedures / Treatments   Labs (all labs ordered are  listed, but only abnormal results are displayed) Labs Reviewed  COMPREHENSIVE METABOLIC PANEL - Abnormal; Notable for the following components:      Result Value   Sodium 134 (*)    Potassium 2.8 (*)    Glucose, Bld 129 (*)    BUN 25 (*)    Calcium 8.7 (*)    Anion  gap 4 (*)    All other components within normal limits  CBC - Abnormal; Notable for the following components:   WBC 14.3 (*)    All other components within normal limits  SARS CORONAVIRUS 2 BY RT PCR  MAGNESIUM    EKG EKG Interpretation  Date/Time:  Monday February 22 2022 14:13:10 EDT Ventricular Rate:  81 PR Interval:  199 QRS Duration: 95 QT Interval:  514 QTC Calculation: 597 R Axis:   -48 Text Interpretation: Sinus arrhythmia Left anterior fascicular block Abnormal R-wave progression, late transition LVH with secondary repolarization abnormality Prolonged QT interval Confirmed by Godfrey Pick [phone]) on 02/22/2022 2:18:30 PM  Radiology DG Chest 2 View  Result Date: 02/22/2022 CLINICAL DATA:  Productive cough EXAM: CHEST - 2 VIEW COMPARISON:  12/10/2021 FINDINGS: Transverse diameter of heart is increased. Thoracic aorta is tortuous and ectatic. This finding has not changed significantly. There are no signs of pulmonary edema or focal pulmonary consolidation. There is no pleural effusion or pneumothorax. There is surgical fusion in the lower cervical spine. IMPRESSION: There are no signs of pulmonary edema or new focal infiltrates. Electronically Signed   By: Elmer Picker M.D.   On: 02/22/2022 14:16   CT Head Wo Contrast  Result Date: 02/22/2022 CLINICAL DATA:  A 76 year old female presents for evaluation of neck trauma. EXAM: CT HEAD WITHOUT CONTRAST CT CERVICAL SPINE WITHOUT CONTRAST TECHNIQUE: Multidetector CT imaging of the head and cervical spine was performed following the standard protocol without intravenous contrast. Multiplanar CT image reconstructions of the cervical spine were also generated. RADIATION  DOSE REDUCTION: This exam was performed according to the departmental dose-optimization program which includes automated exposure control, adjustment of the mA and/or kV according to patient size and/or use of iterative reconstruction technique. COMPARISON:  December 10, 2021. FINDINGS: CT HEAD FINDINGS Brain: No evidence of acute infarction, hemorrhage, hydrocephalus, extra-axial collection or mass lesion/mass effect. Signs of mild atrophy and of chronic microvascular ischemic change are similar to previous imaging. Vascular: No hyperdense vessel or unexpected calcification. Skull: Normal. Negative for fracture or focal lesion. Sinuses/Orbits: Small amount of fluid in LEFT maxillary sinus which is incompletely visualized. No mucosal thickening. Signs of prior facial trauma along the RIGHT infraorbital region. Scattered ethmoid opacification. Other: None CT CERVICAL SPINE FINDINGS Alignment: Straightening of normal cervical lordotic curvature and mild, 2-3 mm anterolisthesis of C2 on C3 is unchanged along with changes of spinal fusion in the mid and lower cervical spine and moderate to marked degenerative change as well. Skull base and vertebrae: No acute fracture. No primary bone lesion or focal pathologic process. Soft tissues and spinal canal: No prevertebral fluid or swelling. No visible canal hematoma. Disc levels: Multilevel degenerative changes with signs of C5 through C7 anterior cervical discectomy and fusion and fusion of posterior elements at C6-7. Findings are similar to prior imaging. Upper chest: Negative. Other: None IMPRESSION: 1. No acute intracranial abnormality. 2. Very small amount of fluid in the dependent aspect of the LEFT maxillary sinus which is incompletely evaluated. May simply represent sinus disease but would correlate with any signs of facial trauma to determine whether injury to the sinuses is a concern. There is no overt evidence of facial trauma to the extent evaluated on today's study.  w 3. Signs of prior facial trauma along the RIGHT infraorbital region. 4. No evidence for acute traumatic injury to the cervical spine. 5. Multilevel degenerative changes and changes of prior spinal fusion, similar to prior imaging. Electronically Signed  By: Zetta Bills M.D.   On: 02/22/2022 13:57   CT Cervical Spine Wo Contrast  Result Date: 02/22/2022 CLINICAL DATA:  A 76 year old female presents for evaluation of neck trauma. EXAM: CT HEAD WITHOUT CONTRAST CT CERVICAL SPINE WITHOUT CONTRAST TECHNIQUE: Multidetector CT imaging of the head and cervical spine was performed following the standard protocol without intravenous contrast. Multiplanar CT image reconstructions of the cervical spine were also generated. RADIATION DOSE REDUCTION: This exam was performed according to the departmental dose-optimization program which includes automated exposure control, adjustment of the mA and/or kV according to patient size and/or use of iterative reconstruction technique. COMPARISON:  December 10, 2021. FINDINGS: CT HEAD FINDINGS Brain: No evidence of acute infarction, hemorrhage, hydrocephalus, extra-axial collection or mass lesion/mass effect. Signs of mild atrophy and of chronic microvascular ischemic change are similar to previous imaging. Vascular: No hyperdense vessel or unexpected calcification. Skull: Normal. Negative for fracture or focal lesion. Sinuses/Orbits: Small amount of fluid in LEFT maxillary sinus which is incompletely visualized. No mucosal thickening. Signs of prior facial trauma along the RIGHT infraorbital region. Scattered ethmoid opacification. Other: None CT CERVICAL SPINE FINDINGS Alignment: Straightening of normal cervical lordotic curvature and mild, 2-3 mm anterolisthesis of C2 on C3 is unchanged along with changes of spinal fusion in the mid and lower cervical spine and moderate to marked degenerative change as well. Skull base and vertebrae: No acute fracture. No primary bone lesion or  focal pathologic process. Soft tissues and spinal canal: No prevertebral fluid or swelling. No visible canal hematoma. Disc levels: Multilevel degenerative changes with signs of C5 through C7 anterior cervical discectomy and fusion and fusion of posterior elements at C6-7. Findings are similar to prior imaging. Upper chest: Negative. Other: None IMPRESSION: 1. No acute intracranial abnormality. 2. Very small amount of fluid in the dependent aspect of the LEFT maxillary sinus which is incompletely evaluated. May simply represent sinus disease but would correlate with any signs of facial trauma to determine whether injury to the sinuses is a concern. There is no overt evidence of facial trauma to the extent evaluated on today's study. w 3. Signs of prior facial trauma along the RIGHT infraorbital region. 4. No evidence for acute traumatic injury to the cervical spine. 5. Multilevel degenerative changes and changes of prior spinal fusion, similar to prior imaging. Electronically Signed   By: Zetta Bills M.D.   On: 02/22/2022 13:57    Procedures Procedures    Medications Ordered in ED Medications  potassium chloride 10 mEq in 100 mL IVPB (10 mEq Intravenous New Bag/Given 02/22/22 1646)  methylPREDNISolone sodium succinate (SOLU-MEDROL) 125 mg/2 mL injection 125 mg (125 mg Intravenous Given 02/22/22 1350)  ipratropium-albuterol (DUONEB) 0.5-2.5 (3) MG/3ML nebulizer solution 3 mL (3 mLs Nebulization Given 02/22/22 1351)  potassium chloride SA (KLOR-CON M) CR tablet 40 mEq (40 mEq Oral Given 02/22/22 1424)  magnesium sulfate IVPB 1 g 100 mL (0 g Intravenous Stopped 02/22/22 1712)  doxycycline (VIBRA-TABS) tablet 100 mg (100 mg Oral Given 02/22/22 1646)    ED Course/ Medical Decision Making/ A&P                           Medical Decision Making Amount and/or Complexity of Data Reviewed Labs: ordered. Radiology: ordered.  Risk Prescription drug management.   This patient presents to the ED for  concern of cough, this involves an extensive number of treatment options, and is a complaint that carries with it a  high risk of complications and morbidity.  The differential diagnosis includes COPD exacerbation, pneumonia, CHF, bronchitis, URI   Co morbidities that complicate the patient evaluation  HLD, HTN, CAD, PSVT, carotid artery disease, obesity, CKD, GERD, depression, anxiety, COPD and arthritis   Additional history obtained:  Additional history obtained from N/A External records from outside source obtained and reviewed including EMR   Lab Tests:  I Ordered, and personally interpreted labs.  The pertinent results include: Cytosis, hypokalemia   Imaging Studies ordered:  I ordered imaging studies including chest x-ray, CT of head and cervical spine I independently visualized and interpreted imaging which showed no acute injuries from recent fall, no focal opacifications on x-ray I agree with the radiologist interpretation   Cardiac Monitoring: / EKG:  The patient was maintained on a cardiac monitor.  I personally viewed and interpreted the cardiac monitored which showed an underlying rhythm of: Sinus rhythm   Problem List / ED Course / Critical interventions / Medication management  Patient is a 76 year old female presenting for 5 days of worsening cough.  Cough has been productive of brown sputum.  She does have a history of COPD and does utilize breathing treatments at home.  These have helped with her cough.  She denies any severe shortness of breath.  In addition to her cough, patient is concerned about a recent fall 2 days ago.  During this fall, she fell backwards striking the back of her head.  On arrival in the ED, patient is overall well-appearing.  She does have rhonchi and wheezing on lung auscultation.  Cough is present with deep inspiration.  Patient was given Solu-Medrol and DuoNeb for treatment of COPD exacerbation.  She underwent laboratory work-up and  imaging studies to include CT of head and cervical spine to assess for possible injuries from her recent fall.  On imaging studies, patient does not have any evidence of acute osseous or joint abnormalities.  On chest x-ray, there is no evidence of pneumonia or pulmonary edema.  Patient did have improved symptoms following DuoNeb breathing treatment.  On her lab work, patient is found to have hypokalemia with a potassium of 2.8.  Replacement potassium and magnesium were given in the ED.  Patient continued to have sustained improved symptoms while in the ED undergoing potassium replacement.  She does feel comfortable with discharge home.  Patient was prescribed prednisone and doxycycline for treatment of COPD exacerbation.  She was advised to continue breathing treatments at home as needed and to return to the ED for any worsening of symptoms.  She was discharged in stable condition. I ordered medication including Solu-Medrol, DuoNeb, and doxycycline for COPD exacerbation; potassium chloride and magnesium sulfate for hypokalemia Reevaluation of the patient after these medicines showed that the patient improved I have reviewed the patients home medicines and have made adjustments as needed   Social Determinants of Health:  Lives independently  CRITICAL CARE Performed by: Godfrey Pick   Total critical care time: 35 minutes  Critical care time was exclusive of separately billable procedures and treating other patients.  Critical care was necessary to treat or prevent imminent or life-threatening deterioration.  Critical care was time spent personally by me on the following activities: development of treatment plan with patient and/or surrogate as well as nursing, discussions with consultants, evaluation of patient's response to treatment, examination of patient, obtaining history from patient or surrogate, ordering and performing treatments and interventions, ordering and review of laboratory studies,  ordering and review of radiographic  studies, pulse oximetry and re-evaluation of patient's condition.        Final Clinical Impression(s) / ED Diagnoses Final diagnoses:  COPD exacerbation ([place])  Hypokalemia    Rx / DC Orders ED Discharge Orders          Ordered    predniSONE (DELTASONE) 10 MG tablet  Daily        02/22/22 1618    doxycycline (VIBRAMYCIN) 100 MG capsule  2 times daily        02/22/22 1618              Godfrey Pick, MD 02/22/22 1732

## 2022-02-22 NOTE — ED Triage Notes (Signed)
Pt presents for fall from Saturday, pt trip over someone's foot and fell backwards striking back of head, denies LOC, also wants to be evaluated for productive cough, that she has had since Friday.

## 2022-02-26 DIAGNOSIS — R062 Wheezing: Secondary | ICD-10-CM | POA: Diagnosis not present

## 2022-02-26 DIAGNOSIS — J329 Chronic sinusitis, unspecified: Secondary | ICD-10-CM | POA: Diagnosis not present

## 2022-03-09 DIAGNOSIS — H8309 Labyrinthitis, unspecified ear: Secondary | ICD-10-CM | POA: Diagnosis not present

## 2022-03-09 DIAGNOSIS — R110 Nausea: Secondary | ICD-10-CM | POA: Diagnosis not present

## 2022-03-15 ENCOUNTER — Ambulatory Visit (INDEPENDENT_AMBULATORY_CARE_PROVIDER_SITE_OTHER): Payer: Medicare Other | Admitting: Cardiology

## 2022-03-15 ENCOUNTER — Encounter: Payer: Self-pay | Admitting: Cardiology

## 2022-03-15 VITALS — BP 116/82 | HR 71 | Ht 63.0 in | Wt 170.0 lb

## 2022-03-15 DIAGNOSIS — I5020 Unspecified systolic (congestive) heart failure: Secondary | ICD-10-CM | POA: Diagnosis not present

## 2022-03-15 DIAGNOSIS — I471 Supraventricular tachycardia: Secondary | ICD-10-CM | POA: Diagnosis not present

## 2022-03-15 MED ORDER — METOPROLOL SUCCINATE ER 50 MG PO TB24: 75.0000 mg | ORAL_TABLET | Freq: Every day | ORAL | 1 refills | Status: DC

## 2022-03-15 MED ORDER — HYDROCHLOROTHIAZIDE 25 MG PO TABS: 25.0000 mg | ORAL_TABLET | Freq: Every morning | ORAL | 1 refills | Status: DC

## 2022-03-15 NOTE — Progress Notes (Signed)
Cardiology Office Note  Date: 03/15/2022   ID: Michelle Zavala, DOB Sep 15, 1945, MRN [id number]  PCP:  Michelle Burly, MD  Cardiologist:  Michelle Lesches, MD Electrophysiologist:  None   Chief Complaint  Patient presents with   Cardiac follow-up    History of Present Illness: Michelle Zavala is a 76 y.o. female last seen in May.  She is here for a follow-up visit.  She tells me that she feels better overall.  No angina symptoms, actually less atypical thoracic discomfort as well.  She reports NYHA class II dyspnea with routine activities.  No palpitations or syncope.  She is in the process of switching PCP, plans to establish with [facility].  I reviewed her current medications.  From a cardiac perspective she is now only taking Toprol-XL 50 mg daily and HCTZ 25 mg daily.  Also has a potassium supplement.  Blood pressure and heart rate are reasonably controlled today.  She has not had recent lab work.  We have discussed further modification of medical therapy and we will plan repeat lab work, eventually a follow-up echocardiogram.   Past Medical History:  Diagnosis Date   Anxiety    Arthritis    C2 cervical fracture ([place]) 09/17/2013   Traumatic fracture witth minimal displacement   Cardiomyopathy ([place])    a. EF 35-40% by echo in 08/2021   Chronic lung disease    Fibrosis - Dr. Koleen Nimrod   COPD (chronic obstructive pulmonary disease) ([place])    Coronary atherosclerosis of native coronary artery    a. Mild atherosclerosis by cath in 2010 b. NST in 07/2021 showing fixed defects but EF at 41% by NST and 35-40% by echo   Depression    Diverticulosis    Essential hypertension    GERD (gastroesophageal reflux disease)    PSVT (paroxysmal supraventricular tachycardia) ([place])     Past Surgical History:  Procedure Laterality Date   ABDOMINAL HYSTERECTOMY     ANTERIOR RELEASE VERTEBRAL BODY W/ POSTERIOR FUSION     BACTERIAL OVERGROWTH TEST N/A 02/19/2016   Procedure: BACTERIAL  OVERGROWTH TEST;  Surgeon: Michelle Dolin, MD;  [facility];  Service: Endoscopy;  Laterality: N/A;  0700   BIOPSY N/A 08/04/2015   Procedure: BIOPSY;  Surgeon: Michelle Dolin, MD;  Location: [facility];  Service: Endoscopy;  Laterality: N/A;  Gastric   BIOPSY  02/21/2017   Procedure: BIOPSY;  Surgeon: Michelle Dolin, MD;  [facility];  Service: Endoscopy;;  ascending colon biopsy    BIOPSY  08/21/2020   Procedure: BIOPSY;  Surgeon: Michelle Dolin, MD;  [facility];  Service: Endoscopy;;   cataract surgery     [place]   COLONOSCOPY  June 2016   Dr. Britta Mccreedy: moderate diverticulosis in sigmoid colon, surveillance in 5 years    COLONOSCOPY WITH PROPOFOL N/A 02/21/2017   Dr. Gala Romney: diverticulosis in sigmoid colon, tubular adenomas, segmental biopsies benign. Surveillance in 5 years    ESOPHAGOGASTRODUODENOSCOPY (EGD) WITH PROPOFOL N/A 08/04/2015   Dr. Rourk:abnormal gastric mucosa s/p biopsy. Reactive gastropathy. Negative H.pylori   ESOPHAGOGASTRODUODENOSCOPY (EGD) WITH PROPOFOL N/A 02/21/2017   Dr. Gala Romney: empiric esophageal dilatation, small hiatal hernia, otherwise normal   ESOPHAGOGASTRODUODENOSCOPY (EGD) WITH PROPOFOL N/A 08/21/2020   normal esophagus s/p dilation. Erythematous mucosa in stomach of doubtful clinical significant s/p biopsy. Negative H.pylori. Mild reactive gastropathy.    INCISIONAL HERNIA REPAIR N/A 12/03/2015   Procedure: Michelle Zavala  HERNIORRHAPHY WITH MESH;  Surgeon: Michelle Signs, MD;  Location: [facility];  Service: General;  Laterality: N/A;   INSERTION OF MESH  12/03/2015   Procedure: INSERTION OF MESH;  Surgeon: Michelle Signs, MD;  Location: [facility];  Service: General;;   IR RADIOLOGIST EVAL & MGMT  09/22/2017   MALONEY DILATION N/A 02/21/2017   Procedure: Michelle Zavala DILATION;  Surgeon: Michelle Dolin, MD;  [facility];  Service: Endoscopy;  Laterality: N/A;   MALONEY DILATION N/A 08/21/2020   Procedure:  Michelle Zavala DILATION;  Surgeon: Michelle Dolin, MD;  [facility];  Service: Endoscopy;  Laterality: N/A;   POLYPECTOMY  02/21/2017   Procedure: POLYPECTOMY;  Surgeon: Michelle Dolin, MD;  [facility];  Service: Endoscopy;;  hepatic flexure polyp cs   RIGHT/LEFT HEART CATH AND CORONARY ANGIOGRAPHY N/A 09/17/2021   Procedure: RIGHT/LEFT HEART CATH AND CORONARY ANGIOGRAPHY;  Surgeon: Michelle Bush, MD;  Location: [place];  Service: Cardiovascular;  Laterality: N/A;   TMJ ARTHROSCOPY Bilateral 02/04/2015   Procedure: BILATERAL TEMPOROMANDIBULAR JOINT (TMJ) ARTHROSCOPY MENISECTOMY WITH FAT GRAFT FROM ABDOMEN ;  Surgeon: Michelle Zavala, DDS;  Location: [place];  Service: Oral Surgery;  Laterality: Bilateral;   TOTAL KNEE ARTHROPLASTY Bilateral    TUBAL LIGATION      Current Outpatient Medications  Medication Sig Dispense Refill   acetaminophen (TYLENOL) 650 MG CR tablet Take 1,300 mg by mouth every 8 (eight) hours as needed for pain.      albuterol (VENTOLIN HFA) 108 (90 Base) MCG/ACT inhaler Inhale 2 puffs into the lungs every 4 (four) hours as needed for wheezing or shortness of breath.     amitriptyline (ELAVIL) 75 MG tablet Take 75 mg by mouth at bedtime.  6   Ascorbic Acid (VITAMIN C) 1000 MG tablet Take 2,000 mg by mouth daily.     cholecalciferol (VITAMIN D3) 25 MCG (1000 UNIT) tablet Take 1,000 Units by mouth daily.     Cyanocobalamin (VITAMIN B-12) 5000 MCG TBDP Take 1 tablet by mouth daily.     hydrochlorothiazide (HYDRODIURIL) 25 MG tablet Take 25 mg by mouth every morning.     ipratropium (ATROVENT) 0.02 % nebulizer solution Take 0.5 mg by nebulization every 4 (four) hours as needed for wheezing or shortness of breath.     Iron-Vitamins (GERITOL PO) Take 1 tablet by mouth daily.     LORazepam (ATIVAN) 1 MG tablet Take 1 mg by mouth 2 (two) times daily.     magnesium oxide (MAG-OX) 400 MG tablet Take 1 tablet (400 mg total) by mouth daily. 30 tablet 6    meclizine (ANTIVERT) 25 MG tablet Take 25 mg by mouth 3 (three) times daily as needed for dizziness.     Melatonin 10 MG TABS Take 2 tablets by mouth at bedtime.     metoprolol succinate (TOPROL-XL) 50 MG 24 hr tablet Take 1.5 tablets (75 mg total) by mouth daily. Take with or immediately following a meal. (Patient taking differently: Take 50 mg by mouth daily. Take with or immediately following a meal.) 135 tablet 1   Omega-3 Fatty Acids (FISH OIL) 1000 MG CAPS Take 3,000 mg by mouth daily.      pantoprazole (PROTONIX) 40 MG tablet Take 1 tablet (40 mg total) by mouth 2 (two) times daily. 60 tablet 1   potassium chloride SA (KLOR-CON M) 20 MEQ tablet Take 2 tablets (40 mEq total) by mouth daily. 180 tablet 3   sertraline (ZOLOFT) 100  MG tablet Take 100 mg by mouth daily.     Sodium Phosphates (ENEMA RE) Place rectally. As needed     No current facility-administered medications for this visit.   Allergies:  Sulfonamide derivatives   ROS: No orthopnea or PND.  Easy bruising.  Physical Exam: VS:  BP 116/82   Pulse 71   Ht '5\' 3"'$  (1.6 m)   Wt 170 lb (77.1 kg)   SpO2 97%   BMI 30.11 kg/m , BMI Body mass index is 30.11 kg/m.  Wt Readings from Last 3 Encounters:  03/15/22 170 lb (77.1 kg)  02/22/22 170 lb (77.1 kg)  02/11/22 167 lb 3.2 oz (75.8 kg)    General: Patient appears comfortable at rest. HEENT: Conjunctiva and lids normal, oropharynx clear. Neck: Supple, no elevated JVP or carotid bruits, no thyromegaly. Lungs: Clear to auscultation, nonlabored breathing at rest. Cardiac: Regular rate and rhythm, no S3 or significant systolic murmur, no pericardial rub. Extremities: No pitting edema. Skin: Warm and dry.  Extensive ecchymosis mainly on the arms.  ECG:  An ECG dated 02/22/2022 was personally reviewed today and demonstrated:  Sinus arrhythmia with left anterior fascicular block and increased voltage.  Recent Labwork: 02/22/2022: ALT 17; AST 16; BUN 25; Creatinine, Ser 0.83;  Hemoglobin 13.3; Magnesium 1.8; Platelets 233; Potassium 2.8; Sodium 134     Component Value Date/Time   CHOL 223 (H) 09/23/2020 1454   TRIG 220 (H) 09/23/2020 1454   HDL 57 09/23/2020 1454   CHOLHDL 3.9 09/23/2020 1454   LDLCALC 129 (H) 09/23/2020 1454    Other Studies Reviewed Today:  Cardiac catheterization 09/17/2021: Conclusions: Mild-moderate, non-obstructive coronary artery disease, including 20% eccentric LMCA stenosis, sequential 40% mid and distal LAD stenoses, 30% OM1 and OM2 lesions, 60% LPL1 stenosis, and 30% proximal RCA stenosis. Mildly elevated left heart, right heart, and pulmonary artery pressures. Mildly reduced Fick cardiac output/index.   Recommendations: Escalate goal directed medical therapy for management of non-ischemic cardiomyopathy, as tolerated. Consider escalation of diuresis. Medical therapy and risk factor modification to prevent progression of non-obstructive coronary artery disease.   Echocardiogram 12/23/2021 [facility]): Summary    1. The left ventricle is mildly dilated in size with mildly increased wall  thickness.    2. The left ventricular systolic function is mildly to moderately decreased,  LVEF is visually estimated at 35-40%.    3. There is grade I diastolic dysfunction (impaired relaxation).    4. There is mild mitral valve regurgitation.    5. The aortic valve is trileaflet with mildly thickened leaflets with normal  excursion.    6. There is mild to moderate aortic regurgitation.    7. The left atrium is mildly to moderately dilated in size.    8. The right ventricle is normal in size, with normal systolic function.    9. IVC size and inspiratory change suggest mildly elevated right atrial  pressure. (5-10 mmHg).   Chest x-ray 02/22/2022: FINDINGS: Transverse diameter of heart is increased. Thoracic aorta is tortuous and ectatic. This finding has not changed significantly. There are no Zavala of pulmonary edema or focal  pulmonary consolidation. There is no pleural effusion or pneumothorax. There is surgical fusion in the lower cervical spine.   IMPRESSION: There are no Zavala of pulmonary edema or new focal infiltrates.  Assessment and Plan:  1.  HFrEF with nonischemic cardiomyopathy and LVEF 35 to 40% as of April.  Currently on Toprol-XL and HCTZ with potassium supplement.  Blood pressure and heart rate  look reasonable today.  We have discussed addition of other therapies over time, difficult to keep on a consistent regimen for GDMT so far however.  Plan to check follow-up lab work and proceed from there.  Would hold off on follow-up echocardiogram for now.  2.  History of PSVT without recurrences.  3.  Easy bruising and ecchymosis noted.  Check CBC.  She is on omega-3 supplements but no antiplatelet therapy.  4.  Nonobstructive coronary atherosclerosis, asymptomatic in terms of angina.  She stopped Lipitor.  Medication Adjustments/Labs and Tests Ordered: Current medicines are reviewed at length with the patient today.  Concerns regarding medicines are outlined above.   Tests Ordered: Orders Placed This Encounter  Procedures   CBC   Comprehensive metabolic panel   Lipid panel    Medication Changes: No orders of the defined types were placed in this encounter.   Disposition:  Follow up  3 months.  Signed, Satira Sark, MD, [facility] 03/15/2022 1:53 PM    [place] [facility] [place], [place],  [address] Phone: [phone]; Fax: [phone]

## 2022-03-15 NOTE — Patient Instructions (Addendum)
Medication Instructions:  Your physician recommends that you continue on your current medications as directed. Please refer to the Current Medication list given to you today.   Labwork: CBC,CMET, Fasting lipid panel at [facility]  Testing/Procedures: none  Follow-Up: Your physician recommends that you schedule a follow-up appointment in: 3 months  Any Other Special Instructions Will Be Listed Below (If Applicable).  If you need a refill on your cardiac medications before your next appointment, please call your pharmacy.

## 2022-03-15 NOTE — Addendum Note (Signed)
Addended by: Sung Amabile on: 03/15/2022 01:56 PM   Modules accepted: Orders

## 2022-03-17 DIAGNOSIS — I5020 Unspecified systolic (congestive) heart failure: Secondary | ICD-10-CM | POA: Diagnosis not present

## 2022-03-19 ENCOUNTER — Telehealth: Payer: Self-pay | Admitting: *Deleted

## 2022-03-19 MED ORDER — ROSUVASTATIN CALCIUM 20 MG PO TABS: 20.0000 mg | ORAL_TABLET | Freq: Every day | ORAL | 3 refills | Status: DC

## 2022-03-19 MED ORDER — DAPAGLIFLOZIN PROPANEDIOL 10 MG PO TABS: 10.0000 mg | ORAL_TABLET | Freq: Every day | ORAL | 6 refills | Status: DC

## 2022-03-19 NOTE — Telephone Encounter (Signed)
Patient informed Says she has been taking one potassium pill per day. Advised she should be taking potassium 40 meq daily. Verbalized understanding of plan Copy sent to PCP

## 2022-03-19 NOTE — Telephone Encounter (Signed)
-----   Message from Satira Sark, MD sent at 03/18/2022 10:47 AM EDT ----- Results reviewed.  Please let her know that I went over her lab work.  Her LDL is 170, quite high in the setting of atherosclerosis.  She had previously been on Lipitor which she stopped.  I would recommend that we start Crestor 20 mg daily.  Her potassium level is low normal at 3.4, please clarify if she is still taking potassium supplements and if so how much.  Renal function is normal with creatinine 1.0 and her LFTs are also normal.  Hemoglobin is also normal at 13.4 with normal platelet count of 193.  From a cardiac perspective I would recommend that we start Farxiga 10 mg daily in light of her cardiomyopathy and keep follow-up from there.

## 2022-03-19 NOTE — Telephone Encounter (Signed)
Contacted and reminded to take K+40 meq daily Verbalized understanding

## 2022-03-26 DIAGNOSIS — E876 Hypokalemia: Secondary | ICD-10-CM | POA: Diagnosis not present

## 2022-03-26 DIAGNOSIS — Z789 Other specified health status: Secondary | ICD-10-CM | POA: Diagnosis not present

## 2022-03-26 DIAGNOSIS — Z299 Encounter for prophylactic measures, unspecified: Secondary | ICD-10-CM | POA: Diagnosis not present

## 2022-03-26 DIAGNOSIS — D692 Other nonthrombocytopenic purpura: Secondary | ICD-10-CM | POA: Diagnosis not present

## 2022-03-26 DIAGNOSIS — J441 Chronic obstructive pulmonary disease with (acute) exacerbation: Secondary | ICD-10-CM | POA: Diagnosis not present

## 2022-03-26 DIAGNOSIS — K219 Gastro-esophageal reflux disease without esophagitis: Secondary | ICD-10-CM | POA: Diagnosis not present

## 2022-03-26 DIAGNOSIS — E559 Vitamin D deficiency, unspecified: Secondary | ICD-10-CM | POA: Diagnosis not present

## 2022-04-01 DIAGNOSIS — D692 Other nonthrombocytopenic purpura: Secondary | ICD-10-CM | POA: Diagnosis not present

## 2022-04-01 DIAGNOSIS — Z299 Encounter for prophylactic measures, unspecified: Secondary | ICD-10-CM | POA: Diagnosis not present

## 2022-04-01 DIAGNOSIS — I10 Essential (primary) hypertension: Secondary | ICD-10-CM | POA: Diagnosis not present

## 2022-04-02 DIAGNOSIS — I10 Essential (primary) hypertension: Secondary | ICD-10-CM | POA: Diagnosis not present

## 2022-04-02 DIAGNOSIS — Z299 Encounter for prophylactic measures, unspecified: Secondary | ICD-10-CM | POA: Diagnosis not present

## 2022-04-02 DIAGNOSIS — J329 Chronic sinusitis, unspecified: Secondary | ICD-10-CM | POA: Diagnosis not present

## 2022-04-03 ENCOUNTER — Encounter (HOSPITAL_COMMUNITY): Payer: Self-pay | Admitting: Emergency Medicine

## 2022-04-03 ENCOUNTER — Emergency Department (HOSPITAL_COMMUNITY)
Admission: EM | Admit: 2022-04-03 | Discharge: 2022-04-03 | Disposition: A | Discharge status: Home / Self Care | Payer: Medicare Other | Attending: Emergency Medicine | Admitting: Emergency Medicine

## 2022-04-03 ENCOUNTER — Emergency Department (HOSPITAL_COMMUNITY): Payer: Medicare Other

## 2022-04-03 ENCOUNTER — Other Ambulatory Visit: Payer: Self-pay

## 2022-04-03 DIAGNOSIS — Y92410 Unspecified street and highway as the place of occurrence of the external cause: Secondary | ICD-10-CM | POA: Diagnosis not present

## 2022-04-03 DIAGNOSIS — R519 Headache, unspecified: Secondary | ICD-10-CM | POA: Diagnosis not present

## 2022-04-03 DIAGNOSIS — S0990XA Unspecified injury of head, initial encounter: Secondary | ICD-10-CM | POA: Diagnosis present

## 2022-04-03 DIAGNOSIS — S060X0A Concussion without loss of consciousness, initial encounter: Secondary | ICD-10-CM | POA: Diagnosis not present

## 2022-04-03 MED ORDER — ACETAMINOPHEN 325 MG PO TABS
650.0000 mg | ORAL_TABLET | Freq: Once | ORAL | Status: AC
  Administered 2022-04-03: 650 mg via ORAL
  Filled 2022-04-03: qty 2
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
## 2022-04-03 NOTE — Discharge Instructions (Signed)
If you develop continued, recurrent, or worsening headache, fever, neck stiffness, vomiting, blurry or double vision, weakness or numbness in your arms or legs, trouble speaking, or any other new/concerning symptoms then return to the ER for evaluation.  

## 2022-04-03 NOTE — ED Triage Notes (Signed)
Patient reports that she was seen by PCP yesterday for headache and given prednisone and Augmentin for possible sinus infection and left ear infection.

## 2022-04-08 DIAGNOSIS — I10 Essential (primary) hypertension: Secondary | ICD-10-CM | POA: Diagnosis not present

## 2022-04-08 DIAGNOSIS — J449 Chronic obstructive pulmonary disease, unspecified: Secondary | ICD-10-CM | POA: Diagnosis not present

## 2022-04-08 DIAGNOSIS — Z299 Encounter for prophylactic measures, unspecified: Secondary | ICD-10-CM | POA: Diagnosis not present

## 2022-04-15 ENCOUNTER — Other Ambulatory Visit: Payer: Self-pay | Admitting: *Deleted

## 2022-04-15 DIAGNOSIS — K219 Gastro-esophageal reflux disease without esophagitis: Secondary | ICD-10-CM | POA: Diagnosis not present

## 2022-04-15 DIAGNOSIS — F32A Depression, unspecified: Secondary | ICD-10-CM | POA: Diagnosis not present

## 2022-04-15 DIAGNOSIS — I429 Cardiomyopathy, unspecified: Secondary | ICD-10-CM | POA: Diagnosis not present

## 2022-04-15 DIAGNOSIS — J439 Emphysema, unspecified: Secondary | ICD-10-CM | POA: Diagnosis not present

## 2022-04-15 DIAGNOSIS — E782 Mixed hyperlipidemia: Secondary | ICD-10-CM | POA: Diagnosis not present

## 2022-04-15 DIAGNOSIS — Z79899 Other long term (current) drug therapy: Secondary | ICD-10-CM | POA: Diagnosis not present

## 2022-04-15 DIAGNOSIS — I1310 Hypertensive heart and chronic kidney disease without heart failure, with stage 1 through stage 4 chronic kidney disease, or unspecified chronic kidney disease: Secondary | ICD-10-CM | POA: Diagnosis not present

## 2022-04-15 DIAGNOSIS — K921 Melena: Secondary | ICD-10-CM | POA: Diagnosis not present

## 2022-04-15 DIAGNOSIS — N183 Chronic kidney disease, stage 3 unspecified: Secondary | ICD-10-CM | POA: Diagnosis not present

## 2022-04-15 DIAGNOSIS — I2510 Atherosclerotic heart disease of native coronary artery without angina pectoris: Secondary | ICD-10-CM | POA: Diagnosis not present

## 2022-04-15 DIAGNOSIS — D692 Other nonthrombocytopenic purpura: Secondary | ICD-10-CM | POA: Diagnosis not present

## 2022-04-15 DIAGNOSIS — I700 Atherosclerosis of aorta: Secondary | ICD-10-CM | POA: Diagnosis not present

## 2022-04-15 NOTE — Patient Outreach (Signed)
  Care Coordination   04/15/2022  Name: Michelle Zavala MRN: [id number] DOB: Jan 03, 1946   Care Coordination Outreach Attempts:  An unsuccessful telephone outreach was attempted today to offer the patient information about available care coordination services as a benefit of their health plan. A HIPAA compliant message was left on voicemail, providing contact information for CSW, encouraging patient to return CSW's call at her earliest convenience.  Follow Up Plan:  Additional outreach attempts will be made to offer the patient care coordination information and services.   Encounter Outcome:  No Answer.   Care Coordination Interventions Activated:  No    Care Coordination Interventions:  No, not indicated.    Nat Christen, BSW, MSW, LCSW  Licensed Education officer, [facility]  Mailing [place] [address], [place] [address], [place] [address] Physical Address-[address], [place],  [address] Toll Free Main # [phone] Fax # [phone] Cell # [phone] [email or web address]

## 2022-04-21 ENCOUNTER — Ambulatory Visit: Payer: Self-pay | Admitting: *Deleted

## 2022-04-21 ENCOUNTER — Other Ambulatory Visit: Payer: Self-pay | Admitting: *Deleted

## 2022-04-21 DIAGNOSIS — M25511 Pain in right shoulder: Secondary | ICD-10-CM | POA: Diagnosis not present

## 2022-04-21 DIAGNOSIS — M7061 Trochanteric bursitis, right hip: Secondary | ICD-10-CM | POA: Diagnosis not present

## 2022-04-21 NOTE — Patient Outreach (Signed)
  Care Coordination   04/21/2022  Name: Louanna Vanliew MRN: [id number] DOB: 1946/03/29   Care Coordination Outreach Attempts:  A second unsuccessful outreach was attempted today to offer the patient with information about available care coordination services as a benefit of their health plan.   HIPAA compliant message left on voicemail, providing contact information for CSW, encouraging patient to return CSW's call at their earliest convenience.  Follow Up Plan:  Additional outreach attempts will be made to offer the patient care coordination information and services.   Encounter Outcome:  No Answer.   Care Coordination Interventions Activated:  No    Care Coordination Interventions:  No, not indicated.    Nat Christen, BSW, MSW, LCSW  Licensed Education officer, environmental [facility]  Mailing [place] [address] [place], [place] [address] Physical Address[address] [place],  [address] Toll Free Main # [phone] Fax # [phone] Cell # [phone] [email or web address]

## 2022-04-22 ENCOUNTER — Encounter: Payer: Self-pay | Admitting: *Deleted

## 2022-04-22 DIAGNOSIS — R413 Other amnesia: Secondary | ICD-10-CM | POA: Diagnosis not present

## 2022-04-22 DIAGNOSIS — I10 Essential (primary) hypertension: Secondary | ICD-10-CM | POA: Diagnosis not present

## 2022-04-22 DIAGNOSIS — J309 Allergic rhinitis, unspecified: Secondary | ICD-10-CM | POA: Diagnosis not present

## 2022-04-22 DIAGNOSIS — Z299 Encounter for prophylactic measures, unspecified: Secondary | ICD-10-CM | POA: Diagnosis not present

## 2022-04-22 NOTE — Patient Instructions (Signed)
Visit Information  Thank you for taking time to visit with me today. Please don't hesitate to contact me if I can be of assistance to you.   Following are the goals we discussed today:   Goals Addressed               This Visit's Progress     Receive Counseling and Supportive Services to Reduce and Manage Symptoms of Depression. (pt-stated)        Care Coordination Interventions:  PHQ2/PHQ9 Depression Screen Completed & Results Reviewed.   Suicidal Ideation/Homicidal Ideation Assessed - None Present. Solution-Focused Strategies Employed. Deep Breathing Exercises, Relaxation Techniques & Mindfulness Meditation Strategies Taught & Encouraged Daily.   Active Listening/Reflection Utilized.  Emotional Support Provided. Problem Solving/Task Centered Solutions Developed.   Provided Psychoeducation for Mental Health Concerns. Provided Brief Cognitive Behavioral Therapy. Reviewed Mental Health Medications & Discussed Importance of Compliance. Quality of Sleep Assessed & Sleep Hygiene Techniques Promoted. Participation in Counseling Emphasized. Participation in Support Group Reviewed. Verbalization of Feelings Encouraged.  Discussed Referral to Psychiatrist for Psychotropic Medication Management. Discussed Referral to Therapist for Psychotherapeutic Counseling Services. Review "12 Common Symptoms of Depression That Shouldn't Be Ignored", mailed to you by CSW on 04/22/2022.          Our next appointment is by telephone on 04/28/2022 at 11:15 am.  Please call the care guide team at [phone] if you need to cancel or reschedule your appointment.   If you are experiencing a Mental Health or [place] or need someone to talk to, please call the Suicide and Crisis Lifeline: 988 call the [place] National Suicide Prevention Lifeline: (860)247-8615 or TTY: (808)415-7370 TTY (419) 048-1222) to talk to a trained counselor call 1-800-273-TALK (toll free, 24 hour hotline) go to  [facility] [address], [place] [phone]) call the [place]: [phone] call 911  Patient verbalizes understanding of instructions and care plan provided today and agrees to view in [place]. Active MyChart status and patient understanding of how to access instructions and care plan via MyChart confirmed with patient.     Telephone follow up appointment with care management team member scheduled for:  04/28/2022 at 11:15 am.  Michelle Zavala, BSW, MSW, [place]  Licensed Clinical Social Worker  [place]  Mailing [place]. [address] [place], [place] [address] Physical Address[address], [place], [place] [address] Toll Free Main # [phone] Fax # [phone] Cell # [phone] [email or web address]

## 2022-04-22 NOTE — Patient Outreach (Signed)
  Care Coordination   Initial Visit Note   04/22/2022  Name: Michelle Zavala MRN: [id number] DOB: 1945/12/31  Mario Voong is a 76 y.o. year old female who sees Ralph Leyden, FNP for primary care. I spoke with Ronnald Collum by phone today.  What matters to the patients health and wellness today?  Receive Counseling and Supportive Services to Reduce and Manage Symptoms of Depression.   Goals Addressed               This Visit's Progress     Receive Counseling and Supportive Services to Reduce & Manage Symptoms of Depression. (pt-stated)        Care Coordination Interventions:  PHQ2/PHQ9 Depression Screen Completed & Results Reviewed.   Suicidal Ideation/Homicidal Ideation Assessed - None Present. Solution-Focused Strategies Employed. Deep Breathing Exercises, Relaxation Techniques & Mindfulness Meditation Strategies Taught & Encouraged Daily.   Active Listening/Reflection Utilized.  Emotional Support Provided. Problem Solving/Task Centered Solutions Developed.   Provided Psychoeducation for Mental Health Concerns. Provided Brief Cognitive Behavioral Therapy. Reviewed Mental Health Medications & Discussed Importance of Compliance. Quality of Sleep Assessed & Sleep Hygiene Techniques Promoted. Participation in Counseling Emphasized. Participation in Support Group Reviewed. Verbalization of Feelings Encouraged.  Discussed Referral to Psychiatrist for Psychotropic Medication Management. Discussed Referral to Therapist for Psychotherapeutic Counseling Services. Review "12 Common Symptoms of Depression That Shouldn't Be Ignored", mailed to you by CSW on 04/22/2022.          SDOH assessments and interventions completed:  Yes  SDOH Interventions Today    Flowsheet Row Most Recent Value  SDOH Interventions   Food Insecurity Interventions Intervention Not Indicated  Financial Strain Interventions Intervention Not Indicated  Housing Interventions Intervention Not Indicated   Physical Activity Interventions Patient Refused  Stress Interventions Offered Community Wellness Resources, Provide Counseling  Social Connections Interventions Patient Refused  Transportation Interventions Intervention Not Indicated  Depression Interventions/Treatment  Referral to Psychiatry, Counseling        Care Coordination Interventions Activated:  Yes   Care Coordination Interventions:  Yes, provided.   Follow up plan: Follow up call scheduled for 04/28/2022 at 11:15 am.  Encounter Outcome:  Pt. Visit Completed.   Nat Christen, BSW, MSW, LCSW  Licensed Education officer, environmental Health System  Mailing [place] [address] [place], [place] [address] Physical Address-[address] [place], [place] [address] Toll Free Main # [phone] Fax # [phone] Cell # [phone] Di [email or web address]

## 2022-04-28 ENCOUNTER — Encounter: Payer: Self-pay | Admitting: *Deleted

## 2022-04-28 ENCOUNTER — Ambulatory Visit: Payer: Self-pay | Admitting: *Deleted

## 2022-04-28 NOTE — Patient Instructions (Signed)
Visit Information  Thank you for taking time to visit with me today. Please don't hesitate to contact me if I can be of assistance to you.   Following are the goals we discussed today:   Goals Addressed               This Visit's Progress     Receive Counseling and Supportive Services to Reduce and Manage Symptoms of Depression. (pt-stated)   On track     Care Coordination Interventions:  Deep Breathing Exercises, Relaxation Techniques & Mindfulness Meditation Strategies Reviewed & Encouraged Daily.   Active Listening/Reflection Utilized.  Emotional Support Provided. Verbalization of Feelings Encouraged.  Provided Brief Cognitive Behavioral Therapy. Participation in Counseling Emphasized. Review Counseling Agencies in [place]. Review Marriage, Relationship, Individual and Family Counseling Services in [place]. Participation in Support Group Reviewed. Continued Discussion of Referral to Psychiatrist for Psychotropic Medication Management. Continued Discussion of Referral to Therapist for Psychotherapeutic Counseling Services. Review "Loneliness and Isolation". Review "Ways to Cope with Caregiver Burnout".           Our next appointment is by telephone on 05/06/2022 at 10:15 am.  Please call the care guide team at [phone] if you need to cancel or reschedule your appointment.   If you are experiencing a Mental Health or [place] or need someone to talk to, please call the Suicide and Crisis Lifeline: 988 call the [place] National Suicide Prevention Lifeline: (331)194-9073 or TTY: (219)887-1339 TTY (810) 214-4838) to talk to a trained counselor call 1-800-273-TALK (toll free, 24 hour hotline) go to [facility] [address] [place] [phone]) call the [place]: ([phone] call 911  Patient verbalizes understanding of instructions and care plan provided today and agrees  to view in [place]. Active MyChart status and patient understanding of how to access instructions and care plan via MyChart confirmed with patient.     Telephone follow up appointment with care management team member scheduled for: 07/06/2022 at 10:15 am.  Michelle Zavala, BSW, MSW, [place]  Licensed Clinical Social Worker  [place]  Mailing [place]. [address] [place], [place] [address] Physical Address-[address], [place],  [address] Toll Free Main # [phone] Fax # [phone] Cell # [phone] [name]

## 2022-04-28 NOTE — Patient Outreach (Signed)
  Care Coordination   Follow Up Visit Note   04/28/2022  Name: Michelle Zavala MRN: [id number] DOB: [birth year]  Michelle Zavala is a 76 y.o. year old female who sees Dr. Priscille Kluver for primary care. I spoke with Michelle Zavala by phone today.  What matters to the patients health and wellness today?  Receive Counseling and Supportive Services to Reduce and Manage Symptoms of Depression.   Goals Addressed               This Visit's Progress     Receive Counseling and Supportive Services to Reduce and Manage Symptoms of Depression. (pt-stated)   On track     Care Coordination Interventions:  Deep Breathing Exercises, Relaxation Techniques & Mindfulness Meditation Strategies Reviewed & Encouraged Daily.   Active Listening/Reflection Utilized.  Emotional Support Provided. Verbalization of Feelings Encouraged.  Provided Brief Cognitive Behavioral Therapy. Participation in Counseling Emphasized. Review Counseling Agencies in [place]. Review Marriage, Relationship, Individual and Family Counseling Services in  [place]. Participation in Support Group Reviewed. Continued Discussion of Referral to Psychiatrist for Psychotropic Medication Management. Continued Discussion of Referral to Therapist for Psychotherapeutic Counseling Services. Review "Loneliness and Isolation". Review "Ways to Cope with Caregiver Burnout".           SDOH assessments and interventions completed:  Yes.     Care Coordination Interventions Activated:  Yes.   Care Coordination Interventions:  Yes, provided.   Follow up plan: Follow up call scheduled for 05/06/2022 at 10:15 am.  Encounter Outcome:  Pt. Visit Completed.    Nat Christen, BSW, MSW, LCSW  Licensed Education officer, environmental Health System  Mailing [place] [address] [place], [place] [address] Physical Address-[address], [place], [place] [address] Toll Free Main #  [phone] Fax # [phone] Cell # [phone] Di Kindle.[email or web address]

## 2022-05-06 ENCOUNTER — Ambulatory Visit: Payer: Self-pay | Admitting: *Deleted

## 2022-05-06 ENCOUNTER — Encounter: Payer: Self-pay | Admitting: *Deleted

## 2022-05-06 NOTE — Patient Instructions (Signed)
Visit Information  Thank you for taking time to visit with me today. Please don't hesitate to contact me if I can be of assistance to you.   Following are the goals we discussed today:   Goals Addressed               This Visit's Progress     Receive Counseling and Supportive Services to Reduce and Manage Symptoms of Depression. (pt-stated)   On track     Care Coordination Interventions:  Deep Breathing Exercises, Relaxation Techniques & Mindfulness Meditation Strategies Encouraged Daily.   Active Listening/Reflection Utilized.  Emotional Support Provided. Verbalization of Feelings Encouraged.  Provided Cognitive Behavioral Therapy. Reviewed Counseling Agencies in [place]. Reviewed "Loneliness and Isolation". Reviewed "Ways to Cope with Caregiver Burnout".           Our next appointment is by telephone on 05/26/2022 at 10:30 am.  Please call the care guide team at [phone] if you need to cancel or reschedule your appointment.   If you are experiencing a Mental Health or [place] or need someone to talk to, please call the Suicide and Crisis Lifeline: 988 call the [place] National Suicide Prevention Lifeline: (513)713-7779 or TTY: (743) 409-7956 TTY 707-033-2315) to talk to a trained counselor call 1-800-273-TALK (toll free, 24 hour hotline) go to [facility] [address], [place] [phone]) call the [place]: [phone] call 911  Patient verbalizes understanding of instructions and care plan provided today and agrees to view in [place]. Active MyChart status and patient understanding of how to access instructions and care plan via MyChart confirmed with patient.     Telephone follow up appointment with care management team member scheduled for:  05/26/2022 at 10:30 am.  Nat Christen, BSW, MSW, [place]  Licensed Clinical Social Worker  [place]  Mailing [place]. [address] [place], [place] [address] Physical Address-[address] [place], [place] [address] Toll Free Main # [phone] Fax # [phone] Cell # [phone] Di Kindle.[email or web address]

## 2022-05-06 NOTE — Patient Outreach (Signed)
  Care Coordination   Follow Up Visit Note   05/06/2022  Name: Michelle Zavala MRN: [id number] DOB: Apr 02, 1946  Michelle Zavala is a 76 y.o. year old female who sees Ralph Leyden, FNP for primary care. I spoke with Ronnald Collum by phone today.  What matters to the patients health and wellness today?  Receive Counseling and Supportive Services to Reduce and Manage Symptoms of Depression.    Goals Addressed               This Visit's Progress     Receive Counseling and Supportive Services to Reduce and Manage Symptoms of Depression. (pt-stated)   On track     Care Coordination Interventions:  Deep Breathing Exercises, Relaxation Techniques & Mindfulness Meditation Strategies Encouraged Daily.   Active Listening/Reflection Utilized.  Emotional Support Provided. Verbalization of Feelings Encouraged.  Provided Cognitive Behavioral Therapy. Reviewed Counseling Agencies in [place]. Reviewed "Loneliness and Isolation". Reviewed "Ways to Cope with Caregiver Burnout".           SDOH assessments and interventions completed:  No.    Care Coordination Interventions Activated:  Yes.   Care Coordination Interventions:  Yes, provided.   Follow up plan: Follow up call scheduled for 05/26/2022 at 10:30 am.  Encounter Outcome:  Pt. Visit Completed.   Nat Christen, BSW, MSW, LCSW  Licensed Education officer, environmental Health System  Mailing [place] [address], [place], [place] [address] Physical Address-[address] [place], [place] [address] Toll Free Main # [phone] Fax # [phone] Cell # [phone] Di Kindle.[email or web address]

## 2022-05-07 DIAGNOSIS — M1000 Idiopathic gout, unspecified site: Secondary | ICD-10-CM | POA: Diagnosis not present

## 2022-05-10 DIAGNOSIS — I10 Essential (primary) hypertension: Secondary | ICD-10-CM | POA: Diagnosis not present

## 2022-05-10 DIAGNOSIS — Z299 Encounter for prophylactic measures, unspecified: Secondary | ICD-10-CM | POA: Diagnosis not present

## 2022-05-10 DIAGNOSIS — Z789 Other specified health status: Secondary | ICD-10-CM | POA: Diagnosis not present

## 2022-05-10 DIAGNOSIS — M10042 Idiopathic gout, left hand: Secondary | ICD-10-CM | POA: Diagnosis not present

## 2022-05-12 DIAGNOSIS — R413 Other amnesia: Secondary | ICD-10-CM | POA: Diagnosis not present

## 2022-05-13 DIAGNOSIS — D692 Other nonthrombocytopenic purpura: Secondary | ICD-10-CM | POA: Diagnosis not present

## 2022-05-13 DIAGNOSIS — K921 Melena: Secondary | ICD-10-CM | POA: Diagnosis not present

## 2022-05-18 DIAGNOSIS — I10 Essential (primary) hypertension: Secondary | ICD-10-CM | POA: Diagnosis not present

## 2022-05-18 DIAGNOSIS — R413 Other amnesia: Secondary | ICD-10-CM | POA: Diagnosis not present

## 2022-05-18 DIAGNOSIS — Z299 Encounter for prophylactic measures, unspecified: Secondary | ICD-10-CM | POA: Diagnosis not present

## 2022-05-18 DIAGNOSIS — Z789 Other specified health status: Secondary | ICD-10-CM | POA: Diagnosis not present

## 2022-05-20 ENCOUNTER — Encounter: Payer: Self-pay | Admitting: *Deleted

## 2022-05-21 DIAGNOSIS — D692 Other nonthrombocytopenic purpura: Secondary | ICD-10-CM | POA: Diagnosis not present

## 2022-05-21 DIAGNOSIS — I10 Essential (primary) hypertension: Secondary | ICD-10-CM | POA: Diagnosis not present

## 2022-05-21 DIAGNOSIS — J441 Chronic obstructive pulmonary disease with (acute) exacerbation: Secondary | ICD-10-CM | POA: Diagnosis not present

## 2022-05-21 DIAGNOSIS — Z789 Other specified health status: Secondary | ICD-10-CM | POA: Diagnosis not present

## 2022-05-21 DIAGNOSIS — Z299 Encounter for prophylactic measures, unspecified: Secondary | ICD-10-CM | POA: Diagnosis not present

## 2022-05-26 ENCOUNTER — Ambulatory Visit: Payer: Self-pay | Admitting: *Deleted

## 2022-05-26 ENCOUNTER — Encounter: Payer: Self-pay | Admitting: *Deleted

## 2022-05-26 DIAGNOSIS — J449 Chronic obstructive pulmonary disease, unspecified: Secondary | ICD-10-CM | POA: Diagnosis not present

## 2022-05-26 DIAGNOSIS — I10 Essential (primary) hypertension: Secondary | ICD-10-CM | POA: Diagnosis not present

## 2022-05-26 DIAGNOSIS — Z299 Encounter for prophylactic measures, unspecified: Secondary | ICD-10-CM | POA: Diagnosis not present

## 2022-05-26 DIAGNOSIS — F17210 Nicotine dependence, cigarettes, uncomplicated: Secondary | ICD-10-CM | POA: Diagnosis not present

## 2022-05-26 DIAGNOSIS — M25552 Pain in left hip: Secondary | ICD-10-CM | POA: Diagnosis not present

## 2022-05-26 DIAGNOSIS — M545 Low back pain, unspecified: Secondary | ICD-10-CM | POA: Diagnosis not present

## 2022-05-26 DIAGNOSIS — M1009 Idiopathic gout, multiple sites: Secondary | ICD-10-CM | POA: Diagnosis not present

## 2022-05-26 NOTE — Patient Outreach (Signed)
  Care Coordination   Follow Up Visit Note   05/26/2022  Name: Michelle Zavala MRN: [id number] DOB: 1946-05-18  Michelle Zavala is a 76 y.o. year old female who sees Michelle Leyden, FNP for primary care. I spoke with Michelle Zavala by phone today.  What matters to the patients health and wellness today?  Receive Counseling and Supportive Services to Reduce and Manage Symptoms of Depression.    Goals Addressed               This Visit's Progress     Receive Counseling and Supportive Services to Reduce and Manage Symptoms of Depression. (pt-stated)   On track     Care Coordination Interventions:  Active Listening/Reflection Utilized.  Emotional Support Provided. Verbalization of Feelings Encouraged.  Cognitive Behavioral Therapy Employed. Client-Centered Therapy Initiated.           SDOH assessments and interventions completed:  Yes.  Care Coordination Interventions Activated:  Yes.   Care Coordination Interventions:  Yes, provided.   Follow up plan: No further intervention required.   Encounter Outcome:  Pt. Visit Completed.   Nat Christen, BSW, MSW, LCSW  Licensed Education officer, environmental Health System  Mailing [place] [address], [place], [place] [address] Physical Address-[address] [place], [place] [address] Toll Free Main # [phone] Fax # [phone] Cell # [phone] Di Kindle.[email or web address]

## 2022-05-26 NOTE — Patient Instructions (Signed)
Visit Information  Thank you for taking time to visit with me today. Please don't hesitate to contact me if I can be of assistance to you.   Following are the goals we discussed today:   Goals Addressed               This Visit's Progress     Receive Counseling and Supportive Services to Reduce and Manage Symptoms of Depression. (pt-stated)   On track     Care Coordination Interventions:  Active Listening/Reflection Utilized.  Emotional Support Provided. Verbalization of Feelings Encouraged.  Cognitive Behavioral Therapy Employed. Client-Centered Therapy Initiated.          Please call the care guide team at [phone] if you need to cancel or reschedule your appointment.   If you are experiencing a Mental Health or [place] or need someone to talk to, please call the Suicide and Crisis Lifeline: 988 call the [place] National Suicide Prevention Lifeline: 347-591-5403 or TTY: (251)138-9511 TTY 910-332-4966) to talk to a trained counselor call 1-800-273-TALK (toll free, 24 hour hotline) go to [facility] [address], [place] [phone]) call the [place]: [phone] call 911  Patient verbalizes understanding of instructions and care plan provided today and agrees to view in [place]. Active MyChart status and patient understanding of how to access instructions and care plan via MyChart confirmed with patient.     No further follow up required.  Nat Christen, BSW, MSW, LCSW  Licensed Education officer, environmental Health System  Mailing [place] [address], [place], [place] [address] Physical Address-[address] [place], [place] [address] Toll Free Main # [phone] Fax # [phone] Cell # [phone] Di Kindle.[email or web address]

## 2022-05-27 ENCOUNTER — Encounter: Payer: Self-pay | Admitting: Orthopaedic Surgery

## 2022-05-27 ENCOUNTER — Ambulatory Visit (INDEPENDENT_AMBULATORY_CARE_PROVIDER_SITE_OTHER): Payer: Medicare Other

## 2022-05-27 ENCOUNTER — Ambulatory Visit (INDEPENDENT_AMBULATORY_CARE_PROVIDER_SITE_OTHER): Payer: Medicare Other | Admitting: Orthopaedic Surgery

## 2022-05-27 VITALS — Ht 63.0 in | Wt 171.0 lb

## 2022-05-27 DIAGNOSIS — M25552 Pain in left hip: Secondary | ICD-10-CM

## 2022-05-27 DIAGNOSIS — M545 Low back pain, unspecified: Secondary | ICD-10-CM | POA: Diagnosis not present
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
## 2022-06-01 DIAGNOSIS — R519 Headache, unspecified: Secondary | ICD-10-CM | POA: Diagnosis not present

## 2022-06-04 DIAGNOSIS — Z299 Encounter for prophylactic measures, unspecified: Secondary | ICD-10-CM | POA: Diagnosis not present

## 2022-06-04 DIAGNOSIS — H7291 Unspecified perforation of tympanic membrane, right ear: Secondary | ICD-10-CM | POA: Diagnosis not present

## 2022-06-04 DIAGNOSIS — J441 Chronic obstructive pulmonary disease with (acute) exacerbation: Secondary | ICD-10-CM | POA: Diagnosis not present

## 2022-06-04 DIAGNOSIS — M109 Gout, unspecified: Secondary | ICD-10-CM | POA: Diagnosis not present

## 2022-06-04 DIAGNOSIS — I10 Essential (primary) hypertension: Secondary | ICD-10-CM | POA: Diagnosis not present

## 2022-06-09 DIAGNOSIS — R0602 Shortness of breath: Secondary | ICD-10-CM | POA: Diagnosis not present

## 2022-06-09 DIAGNOSIS — M25551 Pain in right hip: Secondary | ICD-10-CM | POA: Diagnosis not present

## 2022-06-09 DIAGNOSIS — I10 Essential (primary) hypertension: Secondary | ICD-10-CM | POA: Diagnosis not present

## 2022-06-09 DIAGNOSIS — R059 Cough, unspecified: Secondary | ICD-10-CM | POA: Diagnosis not present

## 2022-06-09 DIAGNOSIS — R0989 Other specified symptoms and signs involving the circulatory and respiratory systems: Secondary | ICD-10-CM | POA: Diagnosis not present

## 2022-06-09 DIAGNOSIS — Z299 Encounter for prophylactic measures, unspecified: Secondary | ICD-10-CM | POA: Diagnosis not present

## 2022-06-09 DIAGNOSIS — J441 Chronic obstructive pulmonary disease with (acute) exacerbation: Secondary | ICD-10-CM | POA: Diagnosis not present

## 2022-06-09 NOTE — Progress Notes (Unsigned)
GI Office Note    Referring Provider: Ralph Leyden, FNP Primary Care Physician:  Ralph Leyden, FNP Primary Gastroenterologist: Dr. Gala Romney  Date:  06/10/2022  ID:  Michelle Zavala, DOB [birth year], MRN [id number]   Chief Complaint   Chief Complaint  Patient presents with   Melena    Last seen a couple weeks ago, happens on and off.    History of Present Illness  Michelle Zavala is a 76 y.o. female with a history of anxiety, cardiomyopathy, COPD, chronic lung fibrosis, CAD, depression, GERD, HTN, history of colon polyps, and chronic constipation presenting today with complaint of black/tarry stools.  Last colonoscopy June 2018.  Diverticulosis in the sigmoid colon. 5 mm polyp in the ascending and descending colon.  Pathology revealing benign mucosa as well as tubular adenomas.  No evidence of microscopic colitis.  For the past repeat colonoscopy in 5 years, due in 2023.  Last EGD December 2021: Normal esophagus s/p dilation, erythematous mucosa of the stomach s/p biopsy, normal duodenum.  Biopsy positive for reactive gastropathy, no H. pylori.   Last seen in the office October 2022.  She reported worsening constipation and was given milk of magnesia by her PCP.  She was unable to remember taking Linzess in the past, reporting that she ran out of medication.  She reported reflux was controlled on pantoprazole 40 mg twice daily.  Denied any dysphagia.  She was given a prescription for Linzess 145 mcg daily and to continue pantoprazole 40 mg twice daily and advised to follow-up in 3 months.  Recently seen hematology with [facility] in August 2023 for consultation regarding unexplained bruising.  It is noted in the HPI that she is taking large quantities of BC powders, omega-3, and fish oil.  The patient also reported black stools.  She had laboratory work-up that revealed no hemorrhagic or bleeding disorder.  She was advised to stop omega-3's, fish oil, and BC powders.  At her follow-up she  reported that her black stools were little bit better although they remain intermittently present.  Most recent hemoglobin stable at 12.6, MCV 91.6, platelets 301.  Today: Melena - intermittent. Black.tarry. First noticed it "a good little while ago". No abdominal pain. Does have some nausea but no vomiting. Takes BC headaches when she has headaches - couple times per month. Does have some lack of appetite and early satiety. Does have reflux but controlled on PPI. Denies BRBPR. Does have some unintentional weight loss - 210lbs 2 years ago and now 171. Not eating as much. Denies any pepto bismol. Denies dysphagia.   Does get constipated at times and has been taking Linzess but only as needed.   Follows with cardiology. Has shortness of breath at baseline and has not gotten worse. No Lower extremity edema.    Current Outpatient Medications  Medication Sig Dispense Refill   acetaminophen (TYLENOL) 650 MG CR tablet Take 1,300 mg by mouth every 8 (eight) hours as needed for pain.      albuterol (VENTOLIN HFA) 108 (90 Base) MCG/ACT inhaler Inhale 2 puffs into the lungs every 4 (four) hours as needed for wheezing or shortness of breath.     amitriptyline (ELAVIL) 75 MG tablet Take 75 mg by mouth at bedtime.  6   Ascorbic Acid (VITAMIN C) 1000 MG tablet Take 2,000 mg by mouth daily.     Cyanocobalamin (VITAMIN B-12) 5000 MCG TBDP Take 1 tablet by mouth daily.     hydrochlorothiazide (HYDRODIURIL) 25 MG tablet Take  1 tablet (25 mg total) by mouth every morning. 90 tablet 1   ipratropium (ATROVENT) 0.02 % nebulizer solution Take 0.5 mg by nebulization every 4 (four) hours as needed for wheezing or shortness of breath.     LORazepam (ATIVAN) 1 MG tablet Take 1 mg by mouth 2 (two) times daily.     Magnesium Oxide -Mg Supplement 500 MG TABS Take 500 mg by mouth daily.     meclizine (ANTIVERT) 25 MG tablet Take 25 mg by mouth 3 (three) times daily as needed for dizziness.     Melatonin 10 MG TABS Take 2  tablets by mouth at bedtime.     metoprolol succinate (TOPROL-XL) 50 MG 24 hr tablet Take 1.5 tablets (75 mg total) by mouth daily. Take with or immediately following a meal. 135 tablet 1   Multiple Vitamins-Minerals (PRESERVISION AREDS 2) CAPS Take 2 Capfuls by mouth daily.     Omega-3 Fatty Acids (FISH OIL) 1000 MG CAPS Take 3,000 mg by mouth daily.      pantoprazole (PROTONIX) 40 MG tablet Take 1 tablet (40 mg total) by mouth 2 (two) times daily. 60 tablet 1   potassium chloride SA (KLOR-CON M) 20 MEQ tablet Take 2 tablets (40 mEq total) by mouth daily. (Patient taking differently: Take 20 mEq by mouth daily.) 180 tablet 3   rosuvastatin (CRESTOR) 20 MG tablet Take 1 tablet (20 mg total) by mouth daily. 90 tablet 3   sertraline (ZOLOFT) 100 MG tablet Take 100 mg by mouth daily.     LINZESS 145 MCG CAPS capsule Take 145 mcg by mouth daily.     meloxicam (MOBIC) 15 MG tablet Take 15 mg by mouth daily as needed.     No current facility-administered medications for this visit.    Past Medical History:  Diagnosis Date   Anxiety    Arthritis    C2 cervical fracture ([place]) 09/17/2013   Traumatic fracture witth minimal displacement   Cardiomyopathy ([place])    a. EF 35-40% by echo in 08/2021   Chronic lung disease    Fibrosis - Dr. Koleen Nimrod   COPD (chronic obstructive pulmonary disease) ([place])    Coronary atherosclerosis of native coronary artery    a. Mild atherosclerosis by cath in 2010 b. NST in 07/2021 showing fixed defects but EF at 41% by NST and 35-40% by echo   Depression    Diverticulosis    Essential hypertension    GERD (gastroesophageal reflux disease)    PSVT (paroxysmal supraventricular tachycardia) ([place])     Past Surgical History:  Procedure Laterality Date   ABDOMINAL HYSTERECTOMY     ANTERIOR RELEASE VERTEBRAL BODY W/ POSTERIOR FUSION     BACTERIAL OVERGROWTH TEST N/A 02/19/2016   Procedure: BACTERIAL OVERGROWTH TEST;  Surgeon: Daneil Dolin, MD;  [facility];  Service: Endoscopy;  Laterality: N/A;  0700   BIOPSY N/A 08/04/2015   Procedure: BIOPSY;  Surgeon: Daneil Dolin, MD;  [facility];  Service: Endoscopy;  Laterality: N/A;  Gastric   BIOPSY  02/21/2017   Procedure: BIOPSY;  Surgeon: Daneil Dolin, MD;  [facility];  Service: Endoscopy;;  ascending colon biopsy    BIOPSY  08/21/2020   Procedure: BIOPSY;  Surgeon: Daneil Dolin, MD;  [facility];  Service: Endoscopy;;   cataract surgery     [place]   COLONOSCOPY  June 2016   Dr. Britta Mccreedy: moderate diverticulosis  in sigmoid colon, surveillance in 5 years    COLONOSCOPY WITH PROPOFOL N/A 02/21/2017   Dr. Gala Romney: diverticulosis in sigmoid colon, tubular adenomas, segmental biopsies benign. Surveillance in 5 years    ESOPHAGOGASTRODUODENOSCOPY (EGD) WITH PROPOFOL N/A 08/04/2015   Dr. Rourk:abnormal gastric mucosa s/p biopsy. Reactive gastropathy. Negative H.pylori   ESOPHAGOGASTRODUODENOSCOPY (EGD) WITH PROPOFOL N/A 02/21/2017   Dr. Gala Romney: empiric esophageal dilatation, small hiatal hernia, otherwise normal   ESOPHAGOGASTRODUODENOSCOPY (EGD) WITH PROPOFOL N/A 08/21/2020   normal esophagus s/p dilation. Erythematous mucosa in stomach of doubtful clinical significant s/p biopsy. Negative H.pylori. Mild reactive gastropathy.    INCISIONAL HERNIA REPAIR N/A 12/03/2015   Procedure: [name] HERNIORRHAPHY WITH MESH;  Surgeon: Aviva Signs, MD;  [facility];  Service: General;  Laterality: N/A;   INSERTION OF MESH  12/03/2015   Procedure: INSERTION OF MESH;  Surgeon: Aviva Signs, MD;  [facility];  Service: General;;   IR RADIOLOGIST EVAL & MGMT  09/22/2017   MALONEY DILATION N/A 02/21/2017   Procedure: [name] DILATION;  Surgeon: Daneil Dolin, MD;  [facility];  Service: Endoscopy;  Laterality: N/A;   MALONEY DILATION N/A 08/21/2020   Procedure: [name] DILATION;  Surgeon: Daneil Dolin, MD;  [facility];  Service: Endoscopy;  Laterality: N/A;   POLYPECTOMY  02/21/2017   Procedure: POLYPECTOMY;  Surgeon: Daneil Dolin, MD;  [facility];  Service: Endoscopy;;  hepatic flexure polyp cs   RIGHT/LEFT HEART CATH AND CORONARY ANGIOGRAPHY N/A 09/17/2021   Procedure: RIGHT/LEFT HEART CATH AND CORONARY ANGIOGRAPHY;  Surgeon: Nelva Bush, MD;  Location: [place] [facility];  Service: Cardiovascular;  Laterality: N/A;   TMJ ARTHROSCOPY Bilateral 02/04/2015   Procedure: BILATERAL TEMPOROMANDIBULAR JOINT (TMJ) ARTHROSCOPY MENISECTOMY WITH FAT GRAFT FROM ABDOMEN ;  Surgeon: Diona Browner, DDS;  Location: [place];  Service: Oral Surgery;  Laterality: Bilateral;   TOTAL KNEE ARTHROPLASTY Bilateral    TUBAL LIGATION      Family History  Problem Relation Age of Onset   Coronary artery disease Father        Premature   Heart disease Father    Coronary artery disease Brother        Premature   Coronary artery disease Sister        Premature   Colon cancer Son     Allergies as of 06/10/2022 - Review Complete 06/10/2022  Allergen Reaction Noted   Sulfonamide derivatives Hives     Social History   Socioeconomic History   Marital status: Widowed    Spouse name: Not on file   Number of children: 3   Years of education: 7   Highest education level: 7th grade  Occupational History   Occupation: Retired  Tobacco Use   Smoking status: Never    Passive exposure: Never   Smokeless tobacco: Never  Vaping Use   Vaping Use: Never used  Substance and Sexual Activity   Alcohol use: Yes    Comment: occasionally   Drug use: No   Sexual activity: Not Currently    Partners: Male    Birth control/protection: None, Surgical  Other Topics Concern   Not on file  Social History Narrative   Occasionally drinks [employer]eps[employer]    Social Determinants of Health   Financial Resource Strain: Low Risk  (04/22/2022)   Overall Financial Resource Strain (CARDIA)    Difficulty of Paying Living  Expenses: Not hard at all  Food Insecurity: No Food Insecurity (04/22/2022)  Hunger Vital Sign    Worried About Running Out of Food in the Last Year: Never true    Ran Out of Food in the Last Year: Never true  Transportation Needs: No Transportation Needs (04/22/2022)   PRAPARE - Hydrologist (Medical): No    Lack of Transportation (Non-Medical): No  Physical Activity: Inactive (04/22/2022)   Exercise Vital Sign    Days of Exercise per Week: 0 days    Minutes of Exercise per Session: 0 min  Stress: Stress Concern Present (04/22/2022)   [place]    Feeling of Stress : To some extent  Social Connections: Moderately Isolated (04/22/2022)   Social Connection and Isolation Panel [NHANES]    Frequency of Communication with Friends and Family: Three times a week    Frequency of Social Gatherings with Friends and Family: Three times a week    Attends Religious Services: More than 4 times per year    Active Member of Clubs or Organizations: No    Attends Archivist Meetings: Never    Marital Status: Widowed     Review of Systems   Gen: Denies fever, chills, anorexia. Denies fatigue, weakness, weight loss.  CV: Denies chest pain, palpitations, syncope, peripheral edema, and claudication. Resp: Denies dyspnea at rest, cough, wheezing, coughing up blood, and pleurisy. GI: See HPI Derm: Denies rash, itching, dry skin Psych: Denies depression, anxiety, memory loss, confusion. No homicidal or suicidal ideation.  Heme: Denies bruising, bleeding, and enlarged lymph nodes.   Physical Exam   BP 120/80 (BP Location: Left Arm, Patient Position: Sitting, Cuff Size: Normal)   Pulse 100   Temp (!) 97.3 F (36.3 C) (Temporal)   Ht 5' 3" (1.6 m)   Wt 171 lb (77.6 kg)   SpO2 97%   BMI 30.29 kg/m   General:   Alert and oriented. No distress noted. Pleasant and cooperative.  Head:   Normocephalic and atraumatic. Eyes:  Conjuctiva clear without scleral icterus. Mouth:  Oral mucosa pink and moist. Good dentition. No lesions. Lungs:  Clear to auscultation bilaterally. No wheezes, rales, or rhonchi. No distress.  Heart:  S1, S2 present without murmurs appreciated.  Abdomen:  +BS, soft,non-distended.  TTP to epigastrium and umbilical region.  No rebound or guarding. No HSM or masses noted. Rectal: Deferred Msk:  Symmetrical without gross deformities. Normal posture. Extremities:  Without edema. Neurologic:  Alert and  oriented x4 Psych:  Alert and cooperative. Normal mood and affect.   Assessment  Michelle Zavala is a 76 y.o. female with a history of anxiety, cardiomyopathy, COPD, chronic lung fibrosis, CAD, depression, GERD, HTN, history of colon polyps, and chronic constipation presenting today with complaint of black/tarry stools.  History of adenomatous colon polyps: Last colonoscopy in June 2018 with 2 tubular adenomas.  History of colon cancer in her son.  Has had some reported weight loss -about 40 pounds over 2 years.  Does note some lack of appetite and early satiety.  Denies any BRBPR.  Does have some melena as stated below.  We will proceed with surveillance colonoscopy in the near future  Melena: Reported that has been going on for a while.  She has admitted to some chronic intermittent aspirin powder use for headaches.  Has been following with hematology regarding her bruising.  Last EGD in December 2021 with esophageal dilation as a mild gastritis.  Suspect likely having oozing related to known gastritis and chronic  aspirin powder use.  Does have some epigastric tenderness on exam today.  Differential includes peptic ulcer disease, gastritis, duodenitis, or other vascular abnormality.  We will proceed with upper endoscopy at concurrent time with colonoscopy for further evaluation.  Advise she should continue her PPI twice daily and stop all use of aspirin powders as well  as other NSAIDs.   Constipation: Controlled on Linzess as needed.  Denies straining.  GERD: Well-controlled on pantoprazole 40 mg twice daily.  GERD diet says lifestyle modifications reinforced  PLAN   Proceed with upper endoscopy and colonoscopy with propofol by Dr. Gala Romney in near future: the risks, benefits, and alternatives have been discussed with the patient in detail. The patient states understanding and desires to proceed.  ASA 3 (need med lst prior to scheduling) Avoid NSAIDs and aspirin powders (BC or Goody powders) Continue pantoprazole 40 mg twice daily GERD diet/lifestyle modifications reinforced Hold iron 10 days prior to procedures Continue Linzess for constipation High Fiber diet Call with medication list.     Venetia Night, MSN, FNP-BC, AGACNP-BC [facility]

## 2022-06-10 ENCOUNTER — Encounter: Payer: Self-pay | Admitting: Gastroenterology

## 2022-06-10 ENCOUNTER — Ambulatory Visit (INDEPENDENT_AMBULATORY_CARE_PROVIDER_SITE_OTHER): Payer: Medicare Other | Admitting: Gastroenterology

## 2022-06-10 ENCOUNTER — Emergency Department (HOSPITAL_COMMUNITY): Payer: Medicare Other

## 2022-06-10 ENCOUNTER — Inpatient Hospital Stay (HOSPITAL_COMMUNITY): Payer: Medicare Other

## 2022-06-10 ENCOUNTER — Ambulatory Visit (HOSPITAL_COMMUNITY): Payer: Medicare Other

## 2022-06-10 ENCOUNTER — Inpatient Hospital Stay (HOSPITAL_COMMUNITY)
Admission: EM | Admit: 2022-06-10 | Discharge: 2022-06-14 | DRG: 481 | Disposition: A | Discharge status: Skilled Nursing Facility | Payer: Medicare Other | Attending: Family Medicine | Admitting: Family Medicine

## 2022-06-10 ENCOUNTER — Other Ambulatory Visit: Payer: Self-pay

## 2022-06-10 ENCOUNTER — Encounter (HOSPITAL_COMMUNITY): Payer: Self-pay | Admitting: *Deleted

## 2022-06-10 VITALS — BP 120/80 | HR 100 | Temp 97.3°F | Ht 63.0 in | Wt 171.0 lb

## 2022-06-10 DIAGNOSIS — I739 Peripheral vascular disease, unspecified: Secondary | ICD-10-CM | POA: Diagnosis not present

## 2022-06-10 DIAGNOSIS — R9082 White matter disease, unspecified: Secondary | ICD-10-CM | POA: Diagnosis not present

## 2022-06-10 DIAGNOSIS — Z9049 Acquired absence of other specified parts of digestive tract: Secondary | ICD-10-CM | POA: Diagnosis not present

## 2022-06-10 DIAGNOSIS — I429 Cardiomyopathy, unspecified: Secondary | ICD-10-CM | POA: Diagnosis not present

## 2022-06-10 DIAGNOSIS — I10 Essential (primary) hypertension: Secondary | ICD-10-CM | POA: Diagnosis not present

## 2022-06-10 DIAGNOSIS — Z96653 Presence of artificial knee joint, bilateral: Secondary | ICD-10-CM | POA: Diagnosis not present

## 2022-06-10 DIAGNOSIS — Z90710 Acquired absence of both cervix and uterus: Secondary | ICD-10-CM

## 2022-06-10 DIAGNOSIS — K5900 Constipation, unspecified: Secondary | ICD-10-CM | POA: Diagnosis not present

## 2022-06-10 DIAGNOSIS — S72002A Fracture of unspecified part of neck of left femur, initial encounter for closed fracture: Principal | ICD-10-CM

## 2022-06-10 DIAGNOSIS — Z79899 Other long term (current) drug therapy: Secondary | ICD-10-CM | POA: Diagnosis not present

## 2022-06-10 DIAGNOSIS — Z800 Family history of malignant neoplasm of digestive organs: Secondary | ICD-10-CM | POA: Diagnosis not present

## 2022-06-10 DIAGNOSIS — I2510 Atherosclerotic heart disease of native coronary artery without angina pectoris: Secondary | ICD-10-CM | POA: Diagnosis not present

## 2022-06-10 DIAGNOSIS — D539 Nutritional anemia, unspecified: Secondary | ICD-10-CM | POA: Diagnosis not present

## 2022-06-10 DIAGNOSIS — S51812A Laceration without foreign body of left forearm, initial encounter: Secondary | ICD-10-CM | POA: Diagnosis present

## 2022-06-10 DIAGNOSIS — I6782 Cerebral ischemia: Secondary | ICD-10-CM | POA: Diagnosis not present

## 2022-06-10 DIAGNOSIS — S72012A Unspecified intracapsular fracture of left femur, initial encounter for closed fracture: Principal | ICD-10-CM | POA: Diagnosis present

## 2022-06-10 DIAGNOSIS — F419 Anxiety disorder, unspecified: Secondary | ICD-10-CM | POA: Diagnosis present

## 2022-06-10 DIAGNOSIS — M79632 Pain in left forearm: Secondary | ICD-10-CM | POA: Diagnosis not present

## 2022-06-10 DIAGNOSIS — E876 Hypokalemia: Secondary | ICD-10-CM | POA: Diagnosis not present

## 2022-06-10 DIAGNOSIS — K219 Gastro-esophageal reflux disease without esophagitis: Secondary | ICD-10-CM | POA: Diagnosis not present

## 2022-06-10 DIAGNOSIS — R2689 Other abnormalities of gait and mobility: Secondary | ICD-10-CM | POA: Diagnosis not present

## 2022-06-10 DIAGNOSIS — R9431 Abnormal electrocardiogram [ECG] [EKG]: Secondary | ICD-10-CM | POA: Diagnosis not present

## 2022-06-10 DIAGNOSIS — M1990 Unspecified osteoarthritis, unspecified site: Secondary | ICD-10-CM | POA: Diagnosis present

## 2022-06-10 DIAGNOSIS — D692 Other nonthrombocytopenic purpura: Secondary | ICD-10-CM | POA: Diagnosis present

## 2022-06-10 DIAGNOSIS — I509 Heart failure, unspecified: Secondary | ICD-10-CM | POA: Diagnosis not present

## 2022-06-10 DIAGNOSIS — Z86010 Personal history of colonic polyps: Secondary | ICD-10-CM | POA: Diagnosis not present

## 2022-06-10 DIAGNOSIS — J449 Chronic obstructive pulmonary disease, unspecified: Secondary | ICD-10-CM | POA: Diagnosis not present

## 2022-06-10 DIAGNOSIS — Z8249 Family history of ischemic heart disease and other diseases of the circulatory system: Secondary | ICD-10-CM | POA: Diagnosis not present

## 2022-06-10 DIAGNOSIS — M25559 Pain in unspecified hip: Secondary | ICD-10-CM | POA: Diagnosis not present

## 2022-06-10 DIAGNOSIS — F32A Depression, unspecified: Secondary | ICD-10-CM | POA: Diagnosis not present

## 2022-06-10 DIAGNOSIS — Z1211 Encounter for screening for malignant neoplasm of colon: Secondary | ICD-10-CM

## 2022-06-10 DIAGNOSIS — S50812A Abrasion of left forearm, initial encounter: Secondary | ICD-10-CM | POA: Diagnosis not present

## 2022-06-10 DIAGNOSIS — Z981 Arthrodesis status: Secondary | ICD-10-CM | POA: Diagnosis not present

## 2022-06-10 DIAGNOSIS — Z882 Allergy status to sulfonamides status: Secondary | ICD-10-CM

## 2022-06-10 DIAGNOSIS — K921 Melena: Secondary | ICD-10-CM | POA: Diagnosis not present

## 2022-06-10 DIAGNOSIS — Z043 Encounter for examination and observation following other accident: Secondary | ICD-10-CM | POA: Diagnosis not present

## 2022-06-10 DIAGNOSIS — W109XXA Fall (on) (from) unspecified stairs and steps, initial encounter: Secondary | ICD-10-CM | POA: Diagnosis present

## 2022-06-10 DIAGNOSIS — M25552 Pain in left hip: Secondary | ICD-10-CM | POA: Diagnosis not present

## 2022-06-10 DIAGNOSIS — N183 Chronic kidney disease, stage 3 unspecified: Secondary | ICD-10-CM | POA: Diagnosis not present

## 2022-06-10 DIAGNOSIS — Z743 Need for continuous supervision: Secondary | ICD-10-CM | POA: Diagnosis not present

## 2022-06-10 DIAGNOSIS — M6281 Muscle weakness (generalized): Secondary | ICD-10-CM | POA: Diagnosis not present

## 2022-06-10 DIAGNOSIS — I130 Hypertensive heart and chronic kidney disease with heart failure and stage 1 through stage 4 chronic kidney disease, or unspecified chronic kidney disease: Secondary | ICD-10-CM | POA: Diagnosis not present

## 2022-06-10 DIAGNOSIS — Z9181 History of falling: Secondary | ICD-10-CM | POA: Diagnosis not present

## 2022-06-10 DIAGNOSIS — S72012D Unspecified intracapsular fracture of left femur, subsequent encounter for closed fracture with routine healing: Secondary | ICD-10-CM | POA: Diagnosis not present

## 2022-06-10 DIAGNOSIS — W19XXXA Unspecified fall, initial encounter: Secondary | ICD-10-CM | POA: Diagnosis not present

## 2022-06-10 DIAGNOSIS — Z79890 Hormone replacement therapy (postmenopausal): Secondary | ICD-10-CM

## 2022-06-10 DIAGNOSIS — M4312 Spondylolisthesis, cervical region: Secondary | ICD-10-CM | POA: Diagnosis not present

## 2022-06-10 LAB — CBC WITH DIFFERENTIAL/PLATELET
Abs Immature Granulocytes: 0.18 10*3/uL — ABNORMAL HIGH (ref 0.00–0.07)
Basophils Absolute: 0 10*3/uL (ref 0.0–0.1)
Basophils Relative: 0 %
Eosinophils Absolute: 0.1 10*3/uL (ref 0.0–0.5)
Eosinophils Relative: 1 %
HCT: 34.3 % — ABNORMAL LOW (ref 36.0–46.0)
Hemoglobin: 12 g/dL (ref 12.0–15.0)
Immature Granulocytes: 2 %
Lymphocytes Relative: 16 %
Lymphs Abs: 1.5 10*3/uL (ref 0.7–4.0)
MCH: 32 pg (ref 26.0–34.0)
MCHC: 35 g/dL (ref 30.0–36.0)
MCV: 91.5 fL (ref 80.0–100.0)
Monocytes Absolute: 0.9 10*3/uL (ref 0.1–1.0)
Monocytes Relative: 10 %
Neutro Abs: 6.7 10*3/uL (ref 1.7–7.7)
Neutrophils Relative %: 71 %
Platelets: 240 10*3/uL (ref 150–400)
RBC: 3.75 MIL/uL — ABNORMAL LOW (ref 3.87–5.11)
RDW: 12.4 % (ref 11.5–15.5)
WBC: 9.5 10*3/uL (ref 4.0–10.5)
nRBC: 0 % (ref 0.0–0.2)

## 2022-06-10 LAB — COMPREHENSIVE METABOLIC PANEL
ALT: 18 U/L (ref 0–44)
AST: 21 U/L (ref 15–41)
Albumin: 3.5 g/dL (ref 3.5–5.0)
Alkaline Phosphatase: 61 U/L (ref 38–126)
Anion gap: 9 (ref 5–15)
BUN: 16 mg/dL (ref 8–23)
CO2: 26 mmol/L (ref 22–32)
Calcium: 8.8 mg/dL — ABNORMAL LOW (ref 8.9–10.3)
Chloride: 99 mmol/L (ref 98–111)
Creatinine, Ser: 0.82 mg/dL (ref 0.44–1.00)
GFR, Estimated: >60 mL/min (ref >60)
Glucose, Bld: 101 mg/dL — ABNORMAL HIGH (ref 70–99)
Potassium: 3.6 mmol/L (ref 3.5–5.1)
Sodium: 134 mmol/L — ABNORMAL LOW (ref 135–145)
Total Bilirubin: 0.9 mg/dL (ref 0.3–1.2)
Total Protein: 6.4 g/dL — ABNORMAL LOW (ref 6.5–8.1)

## 2022-06-10 LAB — MAGNESIUM: Magnesium: 1.6 mg/dL — ABNORMAL LOW (ref 1.7–2.4)

## 2022-06-10 MED ORDER — MAGNESIUM SULFATE 2 GM/50ML IV SOLN
2.0000 g | Freq: Once | INTRAVENOUS | Status: AC
  Administered 2022-06-10: 2 g via INTRAVENOUS
  Filled 2022-06-10: qty 50

## 2022-06-10 MED ORDER — PANTOPRAZOLE SODIUM 40 MG PO TBEC
40.0000 mg | DELAYED_RELEASE_TABLET | Freq: Two times a day (BID) | ORAL | Status: DC
  Administered 2022-06-10 – 2022-06-14 (×7): 40 mg via ORAL
  Filled 2022-06-10 (×8): qty 1

## 2022-06-10 MED ORDER — MORPHINE SULFATE (PF) 4 MG/ML IV SOLN
2.0000 mg | INTRAVENOUS | Status: DC | PRN
  Administered 2022-06-10 – 2022-06-11 (×2): 2 mg via INTRAVENOUS
  Filled 2022-06-10 (×2): qty 1

## 2022-06-10 MED ORDER — IPRATROPIUM-ALBUTEROL 0.5-2.5 (3) MG/3ML IN SOLN: 3.0000 mL | RESPIRATORY_TRACT | Status: DC | PRN

## 2022-06-10 MED ORDER — LORAZEPAM 1 MG PO TABS
1.0000 mg | ORAL_TABLET | Freq: Two times a day (BID) | ORAL | Status: DC
  Administered 2022-06-10: 1 mg via ORAL
  Filled 2022-06-10 (×2): qty 1

## 2022-06-10 MED ORDER — POLYETHYLENE GLYCOL 3350 17 G PO PACK: 17.0000 g | PACK | Freq: Every day | ORAL | Status: DC | PRN

## 2022-06-10 MED ORDER — HYDROCHLOROTHIAZIDE 25 MG PO TABS
25.0000 mg | ORAL_TABLET | Freq: Every morning | ORAL | Status: DC
  Administered 2022-06-12 – 2022-06-14 (×3): 25 mg via ORAL
  Filled 2022-06-10 (×4): qty 1

## 2022-06-10 MED ORDER — POTASSIUM CHLORIDE CRYS ER 20 MEQ PO TBCR
40.0000 meq | EXTENDED_RELEASE_TABLET | Freq: Once | ORAL | Status: AC
  Administered 2022-06-10: 40 meq via ORAL
  Filled 2022-06-10: qty 2

## 2022-06-10 MED ORDER — ONDANSETRON HCL 4 MG/2ML IJ SOLN
4.0000 mg | Freq: Once | INTRAMUSCULAR | Status: AC
  Administered 2022-06-10: 4 mg via INTRAVENOUS
  Filled 2022-06-10: qty 2

## 2022-06-10 MED ORDER — MORPHINE SULFATE (PF) 4 MG/ML IV SOLN
4.0000 mg | Freq: Once | INTRAVENOUS | Status: AC
  Administered 2022-06-10: 4 mg via INTRAVENOUS
  Filled 2022-06-10: qty 1

## 2022-06-10 MED ORDER — ATORVASTATIN CALCIUM 40 MG PO TABS
40.0000 mg | ORAL_TABLET | Freq: Every day | ORAL | Status: DC
  Administered 2022-06-12 – 2022-06-14 (×3): 40 mg via ORAL
  Filled 2022-06-10 (×4): qty 1

## 2022-06-10 MED ORDER — FENTANYL CITRATE PF 50 MCG/ML IJ SOSY
50.0000 ug | PREFILLED_SYRINGE | Freq: Once | INTRAMUSCULAR | Status: AC
  Administered 2022-06-10: 50 ug via INTRAVENOUS
  Filled 2022-06-10: qty 1

## 2022-06-10 MED ORDER — LEVALBUTEROL HCL 0.63 MG/3ML IN NEBU: 0.6300 mg | INHALATION_SOLUTION | Freq: Four times a day (QID) | RESPIRATORY_TRACT | Status: DC | PRN

## 2022-06-10 MED ORDER — METOPROLOL SUCCINATE ER 50 MG PO TB24
50.0000 mg | ORAL_TABLET | Freq: Every day | ORAL | Status: DC
  Administered 2022-06-12 – 2022-06-14 (×3): 50 mg via ORAL
  Filled 2022-06-10 (×4): qty 1
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
## 2022-06-10 NOTE — Assessment & Plan Note (Addendum)
Stable and compensated.  Last echo 08/2019 -EF of 35 to 40%.  With grade 1 diastolic dysfunction, regional wall motion abnormalities of left ventricle.  Subsequent cath 09/2021 shows mild to moderate nonobstructive coronary artery disease. -Resumed metoprolol and HCTZ

## 2022-06-10 NOTE — ED Triage Notes (Signed)
Pt brought in by [facility] for c/o fall; pt was at her PCP and stepped off curb wrong causing her to fall on her left hip; no deformity noted and has good ROM with pain  Pt hit her head with fall and has bleeding to left forearm  Pt not on blood thinners

## 2022-06-10 NOTE — H&P (Signed)
History and Physical    Brazil Voytko VOJ:[id number] DOB: 1945-12-07 DOA: 06/10/2022  PCP: Ralph Leyden, FNP   Patient coming from: Home  I have personally briefly reviewed patient's old medical records in [place]  Chief Complaint: FALL  HPI: Michelle Zavala is a 76 y.o. female with medical history significant for  HTN, cardiomyopathy, COPD, PSVT. Patient went to gastroenterologist today, was leaving the office when she stepped of the curb wrong and fell landing on her left hip. She reports she hit her head.  She denies dizziness.  No chest pain or difficulty breathing.  Denies frequent falls.   She had complained to her gastroenterologist about intermittent melena.  She denies abdominal pain.  No vomiting of blood no blood in stools.  Plan was for outpatient colonoscopy and upper endoscopy in the near future.  ED Course: Stable vitals. Hgb at baseline. Head and cervical Ct without acute abnormalities.  Pelvic CT- Acute nondisplaced left subcapital femoral neck fracture.  EDP talked to Dr. Aline Brochure- plans for surgery likely tomorrow.   Review of Systems: As per HPI all other systems reviewed and negative.  Past Medical History:  Diagnosis Date   Anxiety    Arthritis    C2 cervical fracture ([place]) 09/17/2013   Traumatic fracture witth minimal displacement   Cardiomyopathy ([place])    a. EF 35-40% by echo in 08/2021   Chronic lung disease    Fibrosis - Dr. Koleen Nimrod   COPD (chronic obstructive pulmonary disease) ([place])    Coronary atherosclerosis of native coronary artery    a. Mild atherosclerosis by cath in 2010 b. NST in 07/2021 showing fixed defects but EF at 41% by NST and 35-40% by echo   Depression    Diverticulosis    Essential hypertension    GERD (gastroesophageal reflux disease)    PSVT (paroxysmal supraventricular tachycardia) ([place])     Past Surgical History:  Procedure Laterality Date   ABDOMINAL HYSTERECTOMY     ANTERIOR RELEASE VERTEBRAL BODY W/ POSTERIOR  FUSION     BACTERIAL OVERGROWTH TEST N/A 02/19/2016   Procedure: BACTERIAL OVERGROWTH TEST;  Surgeon: Daneil Dolin, MD;  [facility];  Service: Endoscopy;  Laterality: N/A;  0700   BIOPSY N/A 08/04/2015   Procedure: BIOPSY;  Surgeon: Daneil Dolin, MD;  Location: [facility];  Service: Endoscopy;  Laterality: N/A;  Gastric   BIOPSY  02/21/2017   Procedure: BIOPSY;  Surgeon: Daneil Dolin, MD;  [facility];  Service: Endoscopy;;  ascending colon biopsy    BIOPSY  08/21/2020   Procedure: BIOPSY;  Surgeon: Daneil Dolin, MD;  [facility];  Service: Endoscopy;;   cataract surgery     [place]   COLONOSCOPY  June 2016   Dr. Britta Mccreedy: moderate diverticulosis in sigmoid colon, surveillance in 5 years    COLONOSCOPY WITH PROPOFOL N/A 02/21/2017   Dr. Gala Romney: diverticulosis in sigmoid colon, tubular adenomas, segmental biopsies benign. Surveillance in 5 years    ESOPHAGOGASTRODUODENOSCOPY (EGD) WITH PROPOFOL N/A 08/04/2015   Dr. Rourk:abnormal gastric mucosa s/p biopsy. Reactive gastropathy. Negative H.pylori   ESOPHAGOGASTRODUODENOSCOPY (EGD) WITH PROPOFOL N/A 02/21/2017   Dr. Gala Romney: empiric esophageal dilatation, small hiatal hernia, otherwise normal   ESOPHAGOGASTRODUODENOSCOPY (EGD) WITH PROPOFOL N/A 08/21/2020   normal esophagus s/p dilation. Erythematous mucosa in stomach of doubtful clinical significant s/p biopsy. Negative H.pylori. Mild reactive gastropathy.    INCISIONAL HERNIA REPAIR N/A 12/03/2015  Procedure: INCISIONAL HERNIORRHAPHY WITH MESH;  Surgeon: Aviva Signs, MD;  Location: [facility];  Service: General;  Laterality: N/A;   INSERTION OF MESH  12/03/2015   Procedure: INSERTION OF MESH;  Surgeon: Aviva Signs, MD;  Location: [facility];  Service: General;;   IR RADIOLOGIST EVAL & MGMT  09/22/2017   MALONEY DILATION N/A 02/21/2017   Procedure: [name] DILATION;  Surgeon: Daneil Dolin, MD;  [facility];  Service:  Endoscopy;  Laterality: N/A;   MALONEY DILATION N/A 08/21/2020   Procedure: [name] DILATION;  Surgeon: Daneil Dolin, MD;  [facility];  Service: Endoscopy;  Laterality: N/A;   POLYPECTOMY  02/21/2017   Procedure: POLYPECTOMY;  Surgeon: Daneil Dolin, MD;  [facility];  Service: Endoscopy;;  hepatic flexure polyp cs   RIGHT/LEFT HEART CATH AND CORONARY ANGIOGRAPHY N/A 09/17/2021   Procedure: RIGHT/LEFT HEART CATH AND CORONARY ANGIOGRAPHY;  Surgeon: Nelva Bush, MD;  Location: [facility];  Service: Cardiovascular;  Laterality: N/A;   TMJ ARTHROSCOPY Bilateral 02/04/2015   Procedure: BILATERAL TEMPOROMANDIBULAR JOINT (TMJ) ARTHROSCOPY MENISECTOMY WITH FAT GRAFT FROM ABDOMEN ;  Surgeon: Diona Browner, DDS;  Location: [facility];  Service: Oral Surgery;  Laterality: Bilateral;   TOTAL KNEE ARTHROPLASTY Bilateral    TUBAL LIGATION       reports that she has never smoked. She has never been exposed to tobacco smoke. She has never used smokeless tobacco. She reports current alcohol use. She reports that she does not use drugs.  Allergies  Allergen Reactions   Sulfonamide Derivatives Hives    Family History  Problem Relation Age of Onset   Coronary artery disease Father        Premature   Heart disease Father    Coronary artery disease Brother        Premature   Coronary artery disease Sister        Premature   Colon cancer Son    Prior to Admission medications   Medication Sig Start Date End Date Taking? Authorizing Provider  acetaminophen (TYLENOL) 650 MG CR tablet Take 1,300 mg by mouth every 8 (eight) hours as needed for pain.    Yes [provider]  albuterol (VENTOLIN HFA) 108 (90 Base) MCG/ACT inhaler Inhale 2 puffs into the lungs every 4 (four) hours as needed for wheezing or shortness of breath.   Yes [provider]  amitriptyline (ELAVIL) 75 MG tablet Take 75 mg by mouth at bedtime. 01/27/15  Yes [provider]   Cyanocobalamin (VITAMIN B-12) 5000 MCG TBDP Take 1 tablet by mouth daily.   Yes [provider]  hydrochlorothiazide (HYDRODIURIL) 25 MG tablet Take 1 tablet (25 mg total) by mouth every morning. 03/15/22  Yes Satira Sark, MD  ipratropium (ATROVENT) 0.02 % nebulizer solution Take 0.5 mg by nebulization every 4 (four) hours as needed for wheezing or shortness of breath.   Yes [provider]  LINZESS 145 MCG CAPS capsule Take 145 mcg by mouth daily. 06/09/22  Yes [provider]  LORazepam (ATIVAN) 1 MG tablet Take 1 mg by mouth 2 (two) times daily.   Yes [provider]  Magnesium Oxide -Mg Supplement 500 MG TABS Take 500 mg by mouth daily.   Yes [provider]  meclizine (ANTIVERT) 25 MG tablet Take 25 mg by mouth 3 (three) times daily as needed for dizziness.   Yes [provider]  Melatonin 10 MG TABS Take 2 tablets by mouth  at bedtime.   Yes [provider]  metoprolol succinate (TOPROL-XL) 50 MG 24 hr tablet Take 1.5 tablets (75 mg total) by mouth daily. Take with or immediately following a meal. Patient taking differently: Take 50 mg by mouth daily. Take with or immediately following a meal. 03/15/22  Yes Satira Sark, MD  Multiple Vitamins-Minerals (PRESERVISION AREDS 2) CAPS Take 2 Capfuls by mouth daily.   Yes [provider]  pantoprazole (PROTONIX) 40 MG tablet Take 1 tablet (40 mg total) by mouth 2 (two) times daily. 09/28/13  Yes Angiulli, Lavon Paganini, PA-C  potassium chloride SA (KLOR-CON M) 20 MEQ tablet Take 2 tablets (40 mEq total) by mouth daily. 09/15/21  Yes Strader, [place] M, PA-C  sertraline (ZOLOFT) 100 MG tablet Take 100 mg by mouth daily.   Yes [provider]  atorvastatin (LIPITOR) 40 MG tablet Take 40 mg by mouth daily. 04/22/22   [provider]  meloxicam (MOBIC) 15 MG tablet Take 15 mg by mouth daily as needed. Patient not taking: Reported on 06/10/2022 05/22/22   [provider]  rosuvastatin (CRESTOR) 20 MG tablet Take 1 tablet (20 mg total) by mouth daily. Patient not taking: Reported on 06/10/2022 03/19/22   Satira Sark, MD    Physical Exam: Vitals:   06/10/22 0945 06/10/22 1339  BP:  126/82  Pulse:  80  Resp:  16  Temp:  98.1 F (36.7 C)  TempSrc:  Oral  SpO2:  93%  Weight: 77.6 kg   Height: '5\' 3"'$  (1.6 m)     Constitutional: NAD, calm, comfortable Vitals:   06/10/22 0945 06/10/22 1339  BP:  126/82  Pulse:  80  Resp:  16  Temp:  98.1 F (36.7 C)  TempSrc:  Oral  SpO2:  93%  Weight: 77.6 kg   Height: '5\' 3"'$  (1.6 m)    Eyes: PERRL, lids and conjunctivae normal ENMT: Mucous membranes are moist.  Neck: normal, supple, no masses, no thyromegaly Respiratory: clear to auscultation bilaterally, no wheezing, no crackles.  Cardiovascular: Regular rate and rhythm, no murmurs / rubs / gallops. No extremity edema.  Abdomen: no tenderness, no masses palpated. No hepatosplenomegaly. Bowel sounds positive.  Musculoskeletal: no clubbing / cyanosis. No joint deformity upper and lower extremities. Skin: Diffuse purpura of different sizes mostly in bilateral upper extremities, few on lower extremities -patient reports of 2 months duration.  Not on anticoagulation or antiplatelet.   Neurologic: No apparent cranial nerve abnormality moving extremities spontaneously  psychiatric: Normal judgment and insight. Alert and oriented x 3. Normal mood.   Labs on Admission: I have personally reviewed following labs and imaging studies  CBC: Recent Labs  Lab 06/10/22 0955  WBC 9.5  NEUTROABS 6.7  HGB 12.0  HCT 34.3*  MCV 91.5  PLT 093   Basic Metabolic Panel: Recent Labs  Lab 06/10/22 0955  NA 134*  K 3.6  CL 99  CO2 26  GLUCOSE 101*  BUN 16  CREATININE 0.82  CALCIUM 8.8*   GFR: Estimated Creatinine Clearance: 57.6 mL/min (by C-G formula based on SCr of 0.82 mg/dL). Liver Function Tests: Recent Labs  Lab 06/10/22 0955  AST 21   ALT 18  ALKPHOS 61  BILITOT 0.9  PROT 6.4*  ALBUMIN 3.5    Radiological Exams on Admission: CT Hip Left Wo Contrast  Result Date: 06/10/2022 CLINICAL DATA:  Status post fall, left hip pain EXAM: CT OF THE LEFT HIP WITHOUT CONTRAST TECHNIQUE: Multidetector CT imaging of the  left hip was performed according to the standard protocol. Multiplanar CT image reconstructions were also generated. RADIATION DOSE REDUCTION: This exam was performed according to the departmental dose-optimization program which includes automated exposure control, adjustment of the mA and/or kV according to patient size and/or use of iterative reconstruction technique. COMPARISON:  None Available. FINDINGS: Bones/Joint/Cartilage Acute nondisplaced left subcapital femoral neck fracture without displacement or angulation. No other acute fracture or dislocation. Normal alignment. No joint effusion. Ligaments Ligaments are suboptimally evaluated by CT. Muscles and Tendons Muscles are normal. No muscle atrophy. No intramuscular fluid collection or hematoma. Soft tissue No fluid collection or hematoma. No soft tissue mass. Diverticulosis without evidence of diverticulitis. Partially visualized distended bladder. IMPRESSION: Acute nondisplaced left subcapital femoral neck fracture. Electronically Signed   By: Kathreen Devoid M.D.   On: 06/10/2022 13:44   CT HEAD WO CONTRAST (5MM)  Result Date: 06/10/2022 CLINICAL DATA:  Fall. EXAM: CT HEAD WITHOUT CONTRAST CT CERVICAL SPINE WITHOUT CONTRAST TECHNIQUE: Multidetector CT imaging of the head and cervical spine was performed following the standard protocol without intravenous contrast. Multiplanar CT image reconstructions of the cervical spine were also generated. RADIATION DOSE REDUCTION: This exam was performed according to the departmental dose-optimization program which includes automated exposure control, adjustment of the mA and/or kV according to patient size and/or use of iterative  reconstruction technique. COMPARISON:  April 03, 2022.  February 22, 2022. FINDINGS: CT HEAD FINDINGS Brain: Mild chronic ischemic white matter disease is noted. No mass effect or midline shift is noted. Ventricular size is within normal limits. There is no evidence of mass lesion, hemorrhage or acute infarction. Vascular: No hyperdense vessel or unexpected calcification. Skull: Normal. Negative for fracture or focal lesion. Sinuses/Orbits: No acute finding. Other: None. CT CERVICAL SPINE FINDINGS Alignment: Mild grade 1 anterolisthesis of C2-3 is noted which is stable compared to prior exam. Skull base and vertebrae: No acute fracture. No primary bone lesion or focal pathologic process. Soft tissues and spinal canal: No prevertebral fluid or swelling. No visible canal hematoma. Disc levels: Status post surgical anterior fusion of C5-6 and C6-7. Severe degenerative disc disease is also noted at C4-5. There is near fusion of C3-4 most likely due to degenerative change. Upper chest: Negative. Other: None. IMPRESSION: No acute intracranial abnormality seen. Stable postsurgical and degenerative changes are seen involving the cervical spine. No acute abnormality is noted. Electronically Signed   By: Marijo Conception M.D.   On: 06/10/2022 12:07   CT Cervical Spine Wo Contrast  Result Date: 06/10/2022 CLINICAL DATA:  Fall. EXAM: CT HEAD WITHOUT CONTRAST CT CERVICAL SPINE WITHOUT CONTRAST TECHNIQUE: Multidetector CT imaging of the head and cervical spine was performed following the standard protocol without intravenous contrast. Multiplanar CT image reconstructions of the cervical spine were also generated. RADIATION DOSE REDUCTION: This exam was performed according to the departmental dose-optimization program which includes automated exposure control, adjustment of the mA and/or kV according to patient size and/or use of iterative reconstruction technique. COMPARISON:  April 03, 2022.  February 22, 2022. FINDINGS: CT HEAD  FINDINGS Brain: Mild chronic ischemic white matter disease is noted. No mass effect or midline shift is noted. Ventricular size is within normal limits. There is no evidence of mass lesion, hemorrhage or acute infarction. Vascular: No hyperdense vessel or unexpected calcification. Skull: Normal. Negative for fracture or focal lesion. Sinuses/Orbits: No acute finding. Other: None. CT CERVICAL SPINE FINDINGS Alignment: Mild grade 1 anterolisthesis of C2-3 is noted which is stable compared to prior  exam. Skull base and vertebrae: No acute fracture. No primary bone lesion or focal pathologic process. Soft tissues and spinal canal: No prevertebral fluid or swelling. No visible canal hematoma. Disc levels: Status post surgical anterior fusion of C5-6 and C6-7. Severe degenerative disc disease is also noted at C4-5. There is near fusion of C3-4 most likely due to degenerative change. Upper chest: Negative. Other: None. IMPRESSION: No acute intracranial abnormality seen. Stable postsurgical and degenerative changes are seen involving the cervical spine. No acute abnormality is noted. Electronically Signed   By: Marijo Conception M.D.   On: 06/10/2022 12:07   DG Hip Unilat W or Wo Pelvis 2-3 Views Left  Result Date: 06/10/2022 CLINICAL DATA:  Fall, left hip pain EXAM: DG HIP (WITH OR WITHOUT PELVIS) 2-3V LEFT COMPARISON:  None Available. FINDINGS: Subtle, angulated subcapital fracture of the left femoral neck. Osteopenia. No obvious displaced fracture or dislocation of the pelvis or proximal right femur seen in single frontal view. IMPRESSION: 1. Subtle, angulated subcapital fracture of the left femoral neck. 2. No obvious displaced fracture or dislocation of the pelvis or proximal right femur seen in single frontal view. 3. Osteopenia. Electronically Signed   By: Delanna Ahmadi M.D.   On: 06/10/2022 11:53   DG Forearm Left  Result Date: 06/10/2022 CLINICAL DATA:  Status post fall.  Complains of left forearm pain. EXAM:  LEFT FOREARM - 2 VIEW COMPARISON:  None Available. FINDINGS: There is no evidence of fracture or other focal bone lesions. Soft tissues are unremarkable. IMPRESSION: Negative. Electronically Signed   By: Kerby Moors M.D.   On: 06/10/2022 11:49    EKG: Independently reviewed.  Sinus rhythm, rate 67, QTc 545.  LAFB.  Diffuse nonspecific T wave abnormalities - that are old  Assessment/Plan Principal Problem:   Closed subcapital fracture of left femur ([facility]) Active Problems:   Essential hypertension, benign   Cardiomyopathy ([place])   Senile purpura ([facility])   COPD (chronic obstructive pulmonary disease) ([facility])   Prolonged QT interval  Assessment and Plan: * Closed subcapital fracture of left femur ([facility]) Status post mechanical fall.  CT of the left hip without contrast-she has an acute nondisplaced left subcapital femoral neck fracture. - EDP talked to Dr. Aline Brochure, plans for surgery -N.p.o. midnight - IV morphine 2 mg every 4 hourly as needed - Pre-op chest xray  Prolonged QT interval QT 545.  Potassium 3.6. -Check magnesium -51mq p.o. KCL -Hold Zoloft and amitriptyline for now  COPD (chronic obstructive pulmonary disease) (HCC) Stable. -DuoNebs as needed  Senile purpura ([place] She has multiple purpura mostly involving upper extremity of difference sizes.  Patient reports over the past 2 months.  Per care everywhere- 05/13/22, she has had extensive hematology evaluation for this-which failed to show any evidence of hemorrhagic disorder.  Per notes, she has senile purpura, and the omega-3 fatty acids and BC powder(which she was initially taking a lot of) worsened the bruising associated with her senile purpura.     Cardiomyopathy ([place] Stable and compensated.  Last echo 08/2019 -EF of 35 to 40%.  With grade 1 diastolic dysfunction, regional wall motion abnormalities of left ventricle.  Subsequent cath 09/2021 shows mild to moderate nonobstructive coronary artery disease. -Resume metoprolol  and HCTZ  Essential hypertension, benign Stable. -Resume metoprolol, HCTZ   DVT prophylaxis: SCDS Code Status: FULL Family Communication: None at bedside Disposition Plan: > 2 days Consults called: Ortho- Dr. HAline BrochureAdmission status: Inpt tele I certify that at the point of admission  it is my clinical judgment that the patient will require inpatient hospital care spanning beyond 2 midnights from the point of admission due to high intensity of service, high risk for further deterioration and high frequency of surveillance required.     Author: Bethena Roys, MD 06/10/2022 4:32 PM  For on call review [email or web address].

## 2022-06-10 NOTE — Assessment & Plan Note (Addendum)
-  Stable. -Resume metoprolol, HCTZ, amlodipine

## 2022-06-10 NOTE — Patient Instructions (Addendum)
For your constipation: Follow high-fiber diet. Continue Linzess as needed.   We are scheduling you for an upper endoscopy and a colonoscopy with Dr. Gala Romney in the near future.  If you are taking iron you will need to hold this for 10 days prior to your procedure.  Please bring [place] a list of your current medications so we can get you scheduled and give you proper instructions.  For your dark/tarry stools: Avoid any NSAIDs such as ibuprofen, Aleve, Advil. Avoid any aspirin or aspirin powders (including BC or Goody powders) Continue your oral iron (if you are taking it)  For your reflux: Continue to take your pantoprazole 40 mg twice daily Follow a GERD diet:  Avoid fried, fatty, greasy, spicy, citrus foods. Avoid caffeine and carbonated beverages. Avoid chocolate. Try eating 4-6 small meals a day rather than 3 large meals. Do not eat within 3 hours of laying down. Prop head of bed up on wood or bricks to create a 6 inch incline.   It was a pleasure to see you today. I want to create trusting relationships with patients. If you receive a survey regarding your visit,  I greatly appreciate you taking time to fill this out on paper or through your MyChart. I value your feedback.  Venetia Night, MSN, FNP-BC, AGACNP-BC [facility]

## 2022-06-10 NOTE — Progress Notes (Signed)
2 earring 1 necklace  2 partials 1 shirt  Removed from patient and placed in a patient belonging bag and given back to patient.

## 2022-06-10 NOTE — Assessment & Plan Note (Addendum)
-  Stable -DuoNebs as needed

## 2022-06-10 NOTE — Assessment & Plan Note (Signed)
She has multiple purpura mostly involving upper extremity of difference sizes.  Patient reports over the past 2 months.  Per care everywhere- 05/13/22, she has had extensive hematology evaluation for this-which failed to show any evidence of hemorrhagic disorder.  Per notes, she has senile purpura, and the omega-3 fatty acids and BC powder(which she was initially taking a lot of) worsened the bruising associated with her senile purpura.

## 2022-06-10 NOTE — Assessment & Plan Note (Addendum)
Status post mechanical fall.  CT of the left hip without contrast-she has an acute nondisplaced left subcapital femoral neck fracture. - EDP talked to Dr. Aline Brochure, plans for surgery 9/23 - N.p.o. for surgery today - IV morphine 2 mg every 4 hourly as needed

## 2022-06-10 NOTE — Assessment & Plan Note (Addendum)
QT 545.  -repleted magnesium, now up to 2.0 -s/p KCl -Holding Zoloft and amitriptyline for now

## 2022-06-11 ENCOUNTER — Inpatient Hospital Stay (HOSPITAL_COMMUNITY): Payer: Medicare Other

## 2022-06-11 ENCOUNTER — Encounter (HOSPITAL_COMMUNITY)
Admission: EM | Disposition: A | Discharge status: Skilled Nursing Facility | Payer: Self-pay | Source: Home / Self Care | Attending: Family Medicine

## 2022-06-11 ENCOUNTER — Inpatient Hospital Stay (HOSPITAL_COMMUNITY): Payer: Medicare Other | Admitting: Certified Registered Nurse Anesthetist

## 2022-06-11 ENCOUNTER — Encounter (HOSPITAL_COMMUNITY): Payer: Self-pay | Admitting: Internal Medicine

## 2022-06-11 DIAGNOSIS — I130 Hypertensive heart and chronic kidney disease with heart failure and stage 1 through stage 4 chronic kidney disease, or unspecified chronic kidney disease: Secondary | ICD-10-CM

## 2022-06-11 DIAGNOSIS — S72012A Unspecified intracapsular fracture of left femur, initial encounter for closed fracture: Secondary | ICD-10-CM | POA: Diagnosis not present

## 2022-06-11 DIAGNOSIS — I10 Essential (primary) hypertension: Secondary | ICD-10-CM | POA: Diagnosis not present

## 2022-06-11 DIAGNOSIS — E876 Hypokalemia: Secondary | ICD-10-CM | POA: Diagnosis not present

## 2022-06-11 DIAGNOSIS — I429 Cardiomyopathy, unspecified: Secondary | ICD-10-CM | POA: Diagnosis not present

## 2022-06-11 DIAGNOSIS — N183 Chronic kidney disease, stage 3 unspecified: Secondary | ICD-10-CM

## 2022-06-11 DIAGNOSIS — I509 Heart failure, unspecified: Secondary | ICD-10-CM

## 2022-06-11 DIAGNOSIS — I2510 Atherosclerotic heart disease of native coronary artery without angina pectoris: Secondary | ICD-10-CM

## 2022-06-11 DIAGNOSIS — S72002A Fracture of unspecified part of neck of left femur, initial encounter for closed fracture: Secondary | ICD-10-CM

## 2022-06-11 HISTORY — PX: HIP PINNING,CANNULATED: SHX1758

## 2022-06-11 LAB — BASIC METABOLIC PANEL
Anion gap: 10 (ref 5–15)
BUN: 14 mg/dL (ref 8–23)
CO2: 23 mmol/L (ref 22–32)
Calcium: 8.6 mg/dL — ABNORMAL LOW (ref 8.9–10.3)
Chloride: 98 mmol/L (ref 98–111)
Creatinine, Ser: 0.72 mg/dL (ref 0.44–1.00)
GFR, Estimated: >60 mL/min (ref >60)
Glucose, Bld: 119 mg/dL — ABNORMAL HIGH (ref 70–99)
Potassium: 3.4 mmol/L — ABNORMAL LOW (ref 3.5–5.1)
Sodium: 131 mmol/L — ABNORMAL LOW (ref 135–145)

## 2022-06-11 LAB — MAGNESIUM: Magnesium: 2 mg/dL (ref 1.7–2.4)

## 2022-06-11 LAB — CBC
HCT: 35.4 % — ABNORMAL LOW (ref 36.0–46.0)
Hemoglobin: 12 g/dL (ref 12.0–15.0)
MCH: 31.3 pg (ref 26.0–34.0)
MCHC: 33.9 g/dL (ref 30.0–36.0)
MCV: 92.4 fL (ref 80.0–100.0)
Platelets: 248 10*3/uL (ref 150–400)
RBC: 3.83 MIL/uL — ABNORMAL LOW (ref 3.87–5.11)
RDW: 12.7 % (ref 11.5–15.5)
WBC: 10.3 10*3/uL (ref 4.0–10.5)
nRBC: 0 % (ref 0.0–0.2)

## 2022-06-11 LAB — SURGICAL PCR SCREEN
MRSA, PCR: POSITIVE — AB
Staphylococcus aureus: POSITIVE — AB

## 2022-06-11 SURGERY — FIXATION, FEMUR, NECK, PERCUTANEOUS, USING SCREW
Anesthesia: Spinal | Site: Hip | Laterality: Left

## 2022-06-11 MED ORDER — BUPIVACAINE HCL (PF) 0.75 % IJ SOLN
INTRAMUSCULAR | Status: DC | PRN
  Administered 2022-06-11: 1.6 mL

## 2022-06-11 MED ORDER — ASPIRIN 325 MG PO TBEC
325.0000 mg | DELAYED_RELEASE_TABLET | Freq: Every day | ORAL | Status: DC
  Administered 2022-06-12 – 2022-06-14 (×3): 325 mg via ORAL
  Filled 2022-06-11 (×3): qty 1

## 2022-06-11 MED ORDER — FENTANYL CITRATE PF 50 MCG/ML IJ SOSY
PREFILLED_SYRINGE | INTRAMUSCULAR | Status: AC
  Filled 2022-06-11: qty 1

## 2022-06-11 MED ORDER — FENTANYL CITRATE PF 50 MCG/ML IJ SOSY
50.0000 ug | PREFILLED_SYRINGE | Freq: Once | INTRAMUSCULAR | Status: AC
  Administered 2022-06-11: 50 ug via INTRAVENOUS

## 2022-06-11 MED ORDER — BUPIVACAINE-EPINEPHRINE (PF) 0.5% -1:200000 IJ SOLN
INTRAMUSCULAR | Status: AC
  Filled 2022-06-11: qty 60

## 2022-06-11 MED ORDER — OXYCODONE HCL 5 MG PO TABS: 5.0000 mg | ORAL_TABLET | Freq: Once | ORAL | Status: DC | PRN

## 2022-06-11 MED ORDER — MUPIROCIN 2 % EX OINT
1.0000 | TOPICAL_OINTMENT | Freq: Two times a day (BID) | CUTANEOUS | Status: DC
  Administered 2022-06-11 – 2022-06-13 (×6): 1 via NASAL
  Filled 2022-06-11: qty 22

## 2022-06-11 MED ORDER — METOCLOPRAMIDE HCL 10 MG PO TABS: 5.0000 mg | ORAL_TABLET | Freq: Three times a day (TID) | ORAL | Status: DC | PRN

## 2022-06-11 MED ORDER — DOCUSATE SODIUM 100 MG PO CAPS
100.0000 mg | ORAL_CAPSULE | Freq: Two times a day (BID) | ORAL | Status: DC
  Administered 2022-06-11 – 2022-06-14 (×6): 100 mg via ORAL
  Filled 2022-06-11 (×6): qty 1

## 2022-06-11 MED ORDER — METHOCARBAMOL 500 MG PO TABS: 500.0000 mg | ORAL_TABLET | Freq: Four times a day (QID) | ORAL | Status: DC | PRN

## 2022-06-11 MED ORDER — CEFAZOLIN SODIUM-DEXTROSE 2-4 GM/100ML-% IV SOLN
2.0000 g | INTRAVENOUS | Status: AC
  Administered 2022-06-11: 2 g via INTRAVENOUS
  Filled 2022-06-11: qty 100

## 2022-06-11 MED ORDER — LORAZEPAM 2 MG/ML IJ SOLN
0.5000 mg | INTRAMUSCULAR | Status: DC | PRN
  Administered 2022-06-11 (×2): 0.5 mg via INTRAVENOUS
  Filled 2022-06-11 (×2): qty 1

## 2022-06-11 MED ORDER — ONDANSETRON HCL 4 MG/2ML IJ SOLN
INTRAMUSCULAR | Status: DC | PRN
  Administered 2022-06-11: 4 mg via INTRAVENOUS

## 2022-06-11 MED ORDER — PHENYLEPHRINE HCL-NACL 20-0.9 MG/250ML-% IV SOLN
INTRAVENOUS | Status: AC
  Filled 2022-06-11: qty 250

## 2022-06-11 MED ORDER — BUPIVACAINE-EPINEPHRINE (PF) 0.5% -1:200000 IJ SOLN
INTRAMUSCULAR | Status: DC | PRN
  Administered 2022-06-11: 60 mL

## 2022-06-11 MED ORDER — PHENYLEPHRINE HCL-NACL 20-0.9 MG/250ML-% IV SOLN
INTRAVENOUS | Status: DC | PRN
  Administered 2022-06-11: 50 ug/min via INTRAVENOUS

## 2022-06-11 MED ORDER — 0.9 % SODIUM CHLORIDE (POUR BTL) OPTIME
TOPICAL | Status: DC | PRN
  Administered 2022-06-11: 1000 mL

## 2022-06-11 MED ORDER — METOCLOPRAMIDE HCL 5 MG/ML IJ SOLN: 5.0000 mg | Freq: Three times a day (TID) | INTRAMUSCULAR | Status: DC | PRN

## 2022-06-11 MED ORDER — HYDROCODONE-ACETAMINOPHEN 7.5-325 MG PO TABS
1.0000 | ORAL_TABLET | ORAL | Status: DC | PRN
  Administered 2022-06-12: 2 via ORAL
  Administered 2022-06-12: 1 via ORAL
  Administered 2022-06-13: 2 via ORAL
  Administered 2022-06-13: 1 via ORAL
  Filled 2022-06-11 (×2): qty 1
  Filled 2022-06-11 (×2): qty 2

## 2022-06-11 MED ORDER — PROCHLORPERAZINE EDISYLATE 10 MG/2ML IJ SOLN
10.0000 mg | Freq: Four times a day (QID) | INTRAMUSCULAR | Status: DC | PRN
  Administered 2022-06-11: 10 mg via INTRAVENOUS
  Filled 2022-06-11: qty 2

## 2022-06-11 MED ORDER — MORPHINE SULFATE (PF) 2 MG/ML IV SOLN
0.5000 mg | INTRAVENOUS | Status: DC | PRN
  Administered 2022-06-11 – 2022-06-12 (×2): 1 mg via INTRAVENOUS
  Filled 2022-06-11 (×2): qty 1

## 2022-06-11 MED ORDER — OXYCODONE HCL 5 MG/5ML PO SOLN: 5.0000 mg | Freq: Once | ORAL | Status: DC | PRN

## 2022-06-11 MED ORDER — PROPOFOL 500 MG/50ML IV EMUL
INTRAVENOUS | Status: DC | PRN
  Administered 2022-06-11: 40 ug/kg/min via INTRAVENOUS

## 2022-06-11 MED ORDER — MENTHOL 3 MG MT LOZG: 1.0000 | LOZENGE | OROMUCOSAL | Status: DC | PRN

## 2022-06-11 MED ORDER — LIDOCAINE HCL (CARDIAC) PF 100 MG/5ML IV SOSY
PREFILLED_SYRINGE | INTRAVENOUS | Status: DC | PRN
  Administered 2022-06-11: 60 mg via INTRAVENOUS

## 2022-06-11 MED ORDER — PROPOFOL 10 MG/ML IV BOLUS
INTRAVENOUS | Status: DC | PRN
  Administered 2022-06-11 (×2): 30 mg via INTRAVENOUS

## 2022-06-11 MED ORDER — FENTANYL CITRATE PF 50 MCG/ML IJ SOSY: 25.0000 ug | PREFILLED_SYRINGE | INTRAMUSCULAR | Status: DC | PRN

## 2022-06-11 MED ORDER — METHOCARBAMOL 1000 MG/10ML IJ SOLN: 500.0000 mg | Freq: Four times a day (QID) | INTRAVENOUS | Status: DC | PRN

## 2022-06-11 MED ORDER — PROPOFOL 10 MG/ML IV BOLUS
INTRAVENOUS | Status: AC
  Filled 2022-06-11: qty 20

## 2022-06-11 MED ORDER — FENTANYL CITRATE (PF) 100 MCG/2ML IJ SOLN
INTRAMUSCULAR | Status: DC | PRN
  Administered 2022-06-11: 50 ug via INTRAVENOUS

## 2022-06-11 MED ORDER — CHLORHEXIDINE GLUCONATE 0.12 % MT SOLN
15.0000 mL | Freq: Once | OROMUCOSAL | Status: AC
  Administered 2022-06-11: 15 mL via OROMUCOSAL
  Filled 2022-06-11: qty 15

## 2022-06-11 MED ORDER — PHENOL 1.4 % MT LIQD: 1.0000 | OROMUCOSAL | Status: DC | PRN

## 2022-06-11 MED ORDER — ORAL CARE MOUTH RINSE: 15.0000 mL | Freq: Once | OROMUCOSAL | Status: AC

## 2022-06-11 MED ORDER — ONDANSETRON HCL 4 MG/2ML IJ SOLN
INTRAMUSCULAR | Status: AC
  Filled 2022-06-11: qty 2

## 2022-06-11 MED ORDER — LACTATED RINGERS IV SOLN: INTRAVENOUS | Status: DC

## 2022-06-11 MED ORDER — POTASSIUM CHLORIDE 10 MEQ/100ML IV SOLN
10.0000 meq | INTRAVENOUS | Status: DC
  Administered 2022-06-11: 10 meq via INTRAVENOUS
  Filled 2022-06-11 (×2): qty 100

## 2022-06-11 MED ORDER — ACETAMINOPHEN 500 MG PO TABS
500.0000 mg | ORAL_TABLET | Freq: Four times a day (QID) | ORAL | Status: AC
  Administered 2022-06-11 – 2022-06-12 (×4): 500 mg via ORAL
  Filled 2022-06-11 (×4): qty 1

## 2022-06-11 MED ORDER — FENTANYL CITRATE (PF) 250 MCG/5ML IJ SOLN
INTRAMUSCULAR | Status: AC
  Filled 2022-06-11: qty 5

## 2022-06-11 MED ORDER — CEFAZOLIN SODIUM-DEXTROSE 2-4 GM/100ML-% IV SOLN
2.0000 g | Freq: Four times a day (QID) | INTRAVENOUS | Status: AC
  Administered 2022-06-11 – 2022-06-12 (×2): 2 g via INTRAVENOUS
  Filled 2022-06-11 (×2): qty 100

## 2022-06-11 SURGICAL SUPPLY — 58 items
BAG HAMPER (MISCELLANEOUS) ×1 IMPLANT
BENZOIN TINCTURE PRP APPL 2/3 (GAUZE/BANDAGES/DRESSINGS) IMPLANT
BIT DRILL 4.8X300 (BIT) ×1 IMPLANT
BLADE HEX COATED 2.75 (ELECTRODE) ×1 IMPLANT
BLADE SURG SZ10 CARB STEEL (BLADE) ×2 IMPLANT
BNDG GAUZE ELAST 4 BULKY (GAUZE/BANDAGES/DRESSINGS) ×1 IMPLANT
CHLORAPREP W/TINT 26 (MISCELLANEOUS) ×1 IMPLANT
CLOTH BEACON ORANGE TIMEOUT ST (SAFETY) ×1 IMPLANT
COVER LIGHT HANDLE STERIS (MISCELLANEOUS) ×2 IMPLANT
COVER MAYO STAND XLG (MISCELLANEOUS) IMPLANT
COVER PERINEAL POST (MISCELLANEOUS) ×1 IMPLANT
DECANTER SPIKE VIAL GLASS SM (MISCELLANEOUS) ×2 IMPLANT
DRAPE STERI IOBAN 125X83 (DRAPES) ×1 IMPLANT
DRESSING MEPILEX BORDER 6X8 (GAUZE/BANDAGES/DRESSINGS) ×1 IMPLANT
DRSG MEPILEX BORDER 6X8 (GAUZE/BANDAGES/DRESSINGS) ×1 IMPLANT
DRSG MEPILEX FLEX 6X6 (GAUZE/BANDAGES/DRESSINGS) IMPLANT
DRSG MEPILEX SACRM 8.7X9.8 (GAUZE/BANDAGES/DRESSINGS) ×1 IMPLANT
GLOVE BIOGEL PI IND STRL 7.0 (GLOVE) ×2 IMPLANT
GLOVE SS N UNI LF 8.5 STRL (GLOVE) ×1 IMPLANT
GLOVE SURG POLYISO LF SZ8 (GLOVE) ×2 IMPLANT
GOWN STRL REUS W/TWL LRG LVL3 (GOWN DISPOSABLE) ×1 IMPLANT
GOWN STRL REUS W/TWL XL LVL3 (GOWN DISPOSABLE) ×1 IMPLANT
INST SET MAJOR BONE (KITS) ×1 IMPLANT
KIT BLADEGUARD II DBL (SET/KITS/TRAYS/PACK) ×1 IMPLANT
KIT TURNOVER CYSTO (KITS) ×1 IMPLANT
MANIFOLD NEPTUNE II (INSTRUMENTS) ×1 IMPLANT
MARKER SKIN DUAL TIP RULER LAB (MISCELLANEOUS) ×1 IMPLANT
NDL HYPO 21X1.5 SAFETY (NEEDLE) ×1 IMPLANT
NDL SPNL 18GX3.5 QUINCKE PK (NEEDLE) ×1 IMPLANT
NEEDLE HYPO 21X1.5 SAFETY (NEEDLE) ×1 IMPLANT
NEEDLE SPNL 18GX3.5 QUINCKE PK (NEEDLE) ×1 IMPLANT
NS IRRIG 1000ML POUR BTL (IV SOLUTION) ×1 IMPLANT
PACK BASIC III (CUSTOM PROCEDURE TRAY) ×1
PACK SRG BSC III STRL LF ECLPS (CUSTOM PROCEDURE TRAY) ×1 IMPLANT
PAD ABD 5X9 TENDERSORB (GAUZE/BANDAGES/DRESSINGS) ×1 IMPLANT
PENCIL SMOKE EVACUATOR COATED (MISCELLANEOUS) ×1 IMPLANT
PIN GUIDE DRILL TIP 2.8X300 (DRILL) ×3 IMPLANT
SCREW CANN 6.5 75MM (Screw) ×1 IMPLANT
SCREW CANN 6.5 80MM (Screw) ×1 IMPLANT
SCREW CANN 6.5 90MM (Screw) ×1 IMPLANT
SCREW CANN LG 6.5 FLT 75X40 (Screw) IMPLANT
SCREW CANN LG 6.5 FLT 80X40 (Screw) IMPLANT
SCREW CANN LG 6.5 FLT 90X40 (Screw) IMPLANT
SET BASIN LINEN APH (SET/KITS/TRAYS/PACK) ×1 IMPLANT
SPONGE T-LAP 18X18 ~~LOC~~+RFID (SPONGE) ×2 IMPLANT
STAPLER VISISTAT 35W (STAPLE) IMPLANT
STRIP CLOSURE SKIN 1/2X4 (GAUZE/BANDAGES/DRESSINGS) IMPLANT
SUT BRALON NAB BRD #1 30IN (SUTURE) ×1 IMPLANT
SUT MNCRL 0 VIOLET CTX 36 (SUTURE) ×1 IMPLANT
SUT MON AB 0 CT1 (SUTURE) IMPLANT
SUT MON AB 2-0 CT1 36 (SUTURE) IMPLANT
SUT MONOCRYL 0 CTX 36 (SUTURE) ×1
SYR 30ML LL (SYRINGE) ×1 IMPLANT
SYR BULB IRRIG 60ML STRL (SYRINGE) ×2 IMPLANT
TRAY FOLEY W/BAG SLVR 16FR (SET/KITS/TRAYS/PACK) ×1
TRAY FOLEY W/BAG SLVR 16FR ST (SET/KITS/TRAYS/PACK) ×1 IMPLANT
WASHER ACECAN 6.5 (Washer) IMPLANT
YANKAUER SUCT BULB TIP 10FT TU (MISCELLANEOUS) ×1 IMPLANT

## 2022-06-11 NOTE — Hospital Course (Signed)
76 y.o. female with medical history significant for  HTN, cardiomyopathy, COPD, PSVT. Patient went to gastroenterologist today, was leaving the office when she stepped of the curb wrong and fell landing on her left hip. She reports she hit her head.  She denies dizziness.  No chest pain or difficulty breathing.  Denies frequent falls.   She had complained to her gastroenterologist about intermittent melena.  She denies abdominal pain.  No vomiting of blood no blood in stools.  Plan was for outpatient colonoscopy and upper endoscopy in the near future.   ED Course: Stable vitals. Hgb at baseline. Head and cervical Ct without acute abnormalities.  Pelvic CT- Acute nondisplaced left subcapital femoral neck fracture.  EDP talked to Dr. Aline Brochure- plans for surgery 9/29.

## 2022-06-11 NOTE — H&P (View-Only) (Signed)
Reason for Consult:left hip pain  Referring Physician: er   Michelle Zavala is an 76 y.o. female.  HPI: fell going down stairs   Past Medical History:  Diagnosis Date   Anxiety    Arthritis    C2 cervical fracture ([place]) 09/17/2013   Traumatic fracture witth minimal displacement   Cardiomyopathy ([place])    a. EF 35-40% by echo in 08/2021   Chronic lung disease    Fibrosis - Dr. Koleen Nimrod   COPD (chronic obstructive pulmonary disease) ([place])    Coronary atherosclerosis of native coronary artery    a. Mild atherosclerosis by cath in 2010 b. NST in 07/2021 showing fixed defects but EF at 41% by NST and 35-40% by echo   Depression    Diverticulosis    Essential hypertension    GERD (gastroesophageal reflux disease)    PSVT (paroxysmal supraventricular tachycardia) ([place])     Past Surgical History:  Procedure Laterality Date   ABDOMINAL HYSTERECTOMY     ANTERIOR RELEASE VERTEBRAL BODY W/ POSTERIOR FUSION     BACTERIAL OVERGROWTH TEST N/A 02/19/2016   Procedure: BACTERIAL OVERGROWTH TEST;  Surgeon: Daneil Dolin, MD;  [facility];  Service: Endoscopy;  Laterality: N/A;  0700   BIOPSY N/A 08/04/2015   Procedure: BIOPSY;  Surgeon: Daneil Dolin, MD;  Location: [facility];  Service: Endoscopy;  Laterality: N/A;  Gastric   BIOPSY  02/21/2017   Procedure: BIOPSY;  Surgeon: Daneil Dolin, MD;  [facility];  Service: Endoscopy;;  ascending colon biopsy    BIOPSY  08/21/2020   Procedure: BIOPSY;  Surgeon: Daneil Dolin, MD;  [facility];  Service: Endoscopy;;   cataract surgery     [place]   COLONOSCOPY  June 2016   Dr. Britta Mccreedy: moderate diverticulosis in sigmoid colon, surveillance in 5 years    COLONOSCOPY WITH PROPOFOL N/A 02/21/2017   Dr. Gala Romney: diverticulosis in sigmoid colon, tubular adenomas, segmental biopsies benign. Surveillance in 5 years    ESOPHAGOGASTRODUODENOSCOPY (EGD) WITH PROPOFOL N/A 08/04/2015   Dr.  Rourk:abnormal gastric mucosa s/p biopsy. Reactive gastropathy. Negative H.pylori   ESOPHAGOGASTRODUODENOSCOPY (EGD) WITH PROPOFOL N/A 02/21/2017   Dr. Gala Romney: empiric esophageal dilatation, small hiatal hernia, otherwise normal   ESOPHAGOGASTRODUODENOSCOPY (EGD) WITH PROPOFOL N/A 08/21/2020   normal esophagus s/p dilation. Erythematous mucosa in stomach of doubtful clinical significant s/p biopsy. Negative H.pylori. Mild reactive gastropathy.    INCISIONAL HERNIA REPAIR N/A 12/03/2015   Procedure: [name] HERNIORRHAPHY WITH MESH;  Surgeon: Aviva Signs, MD;  Location: [facility];  Service: General;  Laterality: N/A;   INSERTION OF MESH  12/03/2015   Procedure: INSERTION OF MESH;  Surgeon: Aviva Signs, MD;  Location: [facility];  Service: General;;   IR RADIOLOGIST EVAL & MGMT  09/22/2017   MALONEY DILATION N/A 02/21/2017   Procedure: [name] DILATION;  Surgeon: Daneil Dolin, MD;  [facility];  Service: Endoscopy;  Laterality: N/A;   MALONEY DILATION N/A 08/21/2020   Procedure: [name] DILATION;  Surgeon: Daneil Dolin, MD;  [facility];  Service: Endoscopy;  Laterality: N/A;   POLYPECTOMY  02/21/2017   Procedure: POLYPECTOMY;  Surgeon: Daneil Dolin, MD;  [facility];  Service: Endoscopy;;  hepatic flexure polyp cs   RIGHT/LEFT HEART CATH AND CORONARY ANGIOGRAPHY N/A 09/17/2021   Procedure: RIGHT/LEFT HEART CATH AND CORONARY ANGIOGRAPHY;  Surgeon: Nelva Bush, MD;  Location: [place] [facility];  Service: Cardiovascular;  Laterality: N/A;   TMJ ARTHROSCOPY Bilateral 02/04/2015   Procedure: BILATERAL TEMPOROMANDIBULAR JOINT (TMJ) ARTHROSCOPY MENISECTOMY WITH FAT GRAFT FROM ABDOMEN ;  Surgeon: Diona Browner, DDS;  Location: [place];  Service: Oral Surgery;  Laterality: Bilateral;   TOTAL KNEE ARTHROPLASTY Bilateral    TUBAL LIGATION      Family History  Problem Relation Age of Onset   Coronary artery disease Father        Premature   Heart disease Father     Coronary artery disease Brother        Premature   Coronary artery disease Sister        Premature   Colon cancer Son     Social History:  reports that she has never smoked. She has never been exposed to tobacco smoke. She has never used smokeless tobacco. She reports current alcohol use. She reports that she does not use drugs.  Allergies:  Allergies  Allergen Reactions   Sulfonamide Derivatives Hives    Medications:  Current Outpatient Medications  Medication Instructions   acetaminophen (TYLENOL) 1,300 mg, Oral, Every 8 hours PRN   albuterol (VENTOLIN HFA) 108 (90 Base) MCG/ACT inhaler 2 puffs, Inhalation, Every 4 hours PRN   amitriptyline (ELAVIL) 75 mg, Oral, Daily at bedtime   atorvastatin (LIPITOR) 40 mg, Oral, Daily   Cyanocobalamin (VITAMIN B-12) 5000 MCG TBDP 1 tablet, Oral, Daily   hydrochlorothiazide (HYDRODIURIL) 25 mg, Oral, Every morning   ipratropium (ATROVENT) 0.5 mg, Nebulization, Every 4 hours PRN   Linzess 145 mcg, Oral, Daily   LORazepam (ATIVAN) 1 mg, Oral, 2 times daily   Magnesium Oxide -Mg Supplement 500 mg, Oral, Daily   meclizine (ANTIVERT) 25 mg, Oral, 3 times daily PRN   Melatonin 10 MG TABS 2 tablets, Oral, Daily at bedtime   meloxicam (MOBIC) 15 mg, Daily PRN   metoprolol succinate (TOPROL-XL) 75 mg, Oral, Daily, Take with or immediately following a meal.   Multiple Vitamins-Minerals (PRESERVISION AREDS 2) CAPS 2 Capfuls, Oral, Daily   pantoprazole (PROTONIX) 40 mg, Oral, 2 times daily   potassium chloride SA (KLOR-CON M) 20 MEQ tablet 40 mEq, Oral, Daily   rosuvastatin (CRESTOR) 20 mg, Oral, Daily   sertraline (ZOLOFT) 100 mg, Oral, Daily,       Results for orders placed or performed during the hospital encounter of 06/10/22 (from the past 48 hour(s))  CBC with Differential     Status: Abnormal   Collection Time: 06/10/22  9:55 AM  Result Value Ref Range   WBC 9.5 4.0 - 10.5 K/uL   RBC 3.75 (L) 3.87 - 5.11 MIL/uL   Hemoglobin 12.0 12.0 -  15.0 g/dL   HCT 34.3 (L) 36.0 - 46.0 %   MCV 91.5 80.0 - 100.0 fL   MCH 32.0 26.0 - 34.0 pg   MCHC 35.0 30.0 - 36.0 g/dL   RDW 12.4 11.5 - 15.5 %   Platelets 240 150 - 400 K/uL   nRBC 0.0 0.0 - 0.2 %   Neutrophils Relative % 71 %   Neutro Abs 6.7 1.7 - 7.7 K/uL   Lymphocytes Relative 16 %   Lymphs Abs 1.5 0.7 - 4.0 K/uL   Monocytes Relative 10 %   Monocytes Absolute 0.9 0.1 - 1.0 K/uL   Eosinophils Relative 1 %   Eosinophils Absolute 0.1 0.0 - 0.5 K/uL   Basophils Relative 0 %   Basophils Absolute 0.0 0.0 - 0.1 K/uL   Immature Granulocytes 2 %  Abs Immature Granulocytes 0.18 (H) 0.00 - 0.07 K/uL    Comment: Performed at [facility], [address], [place], [place] [address]  Comprehensive metabolic panel     Status: Abnormal   Collection Time: 06/10/22  9:55 AM  Result Value Ref Range   Sodium 134 (L) 135 - 145 mmol/L   Potassium 3.6 3.5 - 5.1 mmol/L   Chloride 99 98 - 111 mmol/L   CO2 26 22 - 32 mmol/L   Glucose, Bld 101 (H) 70 - 99 mg/dL    Comment: Glucose reference range applies only to samples taken after fasting for at least 8 hours.   BUN 16 8 - 23 mg/dL   Creatinine, Ser 0.82 0.44 - 1.00 mg/dL   Calcium 8.8 (L) 8.9 - 10.3 mg/dL   Total Protein 6.4 (L) 6.5 - 8.1 g/dL   Albumin 3.5 3.5 - 5.0 g/dL   AST 21 15 - 41 U/L   ALT 18 0 - 44 U/L   Alkaline Phosphatase 61 38 - 126 U/L   Total Bilirubin 0.9 0.3 - 1.2 mg/dL   GFR, Estimated >60 >60 mL/min    Comment: (NOTE) Calculated using the CKD-EPI Creatinine Equation (2021)    Anion gap 9 5 - 15    Comment: Performed at [facility], [address], [place], [place] [address]  Magnesium     Status: Abnormal   Collection Time: 06/10/22  4:14 PM  Result Value Ref Range   Magnesium 1.6 (L) 1.7 - 2.4 mg/dL    Comment: Performed at [facility], [address], [place], [place] [address]  Surgical PCR screen     Status: Abnormal   Collection Time: 06/11/22  3:23 AM   Specimen: Nasal Mucosa; Nasal Swab  Result Value Ref  Range   MRSA, PCR POSITIVE (A) NEGATIVE    Comment: RESULT CALLED TO, READ BACK BY AND VERIFIED WITH: ROBERTSON B. AT [id number] ON [id number] BY THOMPSON S.    Staphylococcus aureus POSITIVE (A) NEGATIVE    Comment: RESULT CALLED TO, READ BACK BY AND VERIFIED WITH: ROBERTSON B. AT [id number] ON [id number] BY THOMPSON S. (NOTE) The Xpert SA Assay (FDA approved for NASAL specimens in patients 60 years of age and older), is one component of a comprehensive surveillance program. It is not intended to diagnose infection nor to guide or monitor treatment. Performed at [facility], [address] [place], [place] [address]   CBC     Status: Abnormal   Collection Time: 06/11/22  3:25 AM  Result Value Ref Range   WBC 10.3 4.0 - 10.5 K/uL   RBC 3.83 (L) 3.87 - 5.11 MIL/uL   Hemoglobin 12.0 12.0 - 15.0 g/dL   HCT 35.4 (L) 36.0 - 46.0 %   MCV 92.4 80.0 - 100.0 fL   MCH 31.3 26.0 - 34.0 pg   MCHC 33.9 30.0 - 36.0 g/dL   RDW 12.7 11.5 - 15.5 %   Platelets 248 150 - 400 K/uL   nRBC 0.0 0.0 - 0.2 %    Comment: Performed at [facility], [address] [place], [place] [address]  Basic metabolic panel     Status: Abnormal   Collection Time: 06/11/22  3:25 AM  Result Value Ref Range   Sodium 131 (L) 135 - 145 mmol/L   Potassium 3.4 (L) 3.5 - 5.1 mmol/L   Chloride 98 98 - 111 mmol/L   CO2 23 22 - 32 mmol/L   Glucose, Bld 119 (H) 70 - 99  mg/dL    Comment: Glucose reference range applies only to samples taken after fasting for at least 8 hours.   BUN 14 8 - 23 mg/dL   Creatinine, Ser 0.72 0.44 - 1.00 mg/dL   Calcium 8.6 (L) 8.9 - 10.3 mg/dL   GFR, Estimated >60 >60 mL/min    Comment: (NOTE) Calculated using the CKD-EPI Creatinine Equation (2021)    Anion gap 10 5 - 15    Comment: Performed at [facility], [address] [place], [place] [address]  Magnesium     Status: None   Collection Time: 06/11/22  3:25 AM  Result Value Ref Range   Magnesium 2.0 1.7 - 2.4 mg/dL    Comment: Performed at [facility], [address], [place],  [address]    DG CHEST PORT 1 VIEW  Result Date: 06/10/2022 CLINICAL DATA:  Fall, hip fracture in a 76 year old female. EXAM: PORTABLE CHEST 1 VIEW COMPARISON:  June 09, 2022. FINDINGS: EKG leads project over the chest. Cardiomediastinal contours and hilar structures are stable with cardiac enlargement. Lungs are clear. No sign of effusion. No pneumothorax. On limited assessment there is no acute skeletal process. IMPRESSION: No active disease. Electronically Signed   By: Zetta Bills M.D.   On: 06/10/2022 17:00   CT Hip Left Wo Contrast  Result Date: 06/10/2022 CLINICAL DATA:  Status post fall, left hip pain EXAM: CT OF THE LEFT HIP WITHOUT CONTRAST TECHNIQUE: Multidetector CT imaging of the left hip was performed according to the standard protocol. Multiplanar CT image reconstructions were also generated. RADIATION DOSE REDUCTION: This exam was performed according to the departmental dose-optimization program which includes automated exposure control, adjustment of the mA and/or kV according to patient size and/or use of iterative reconstruction technique. COMPARISON:  None Available. FINDINGS: Bones/Joint/Cartilage Acute nondisplaced left subcapital femoral neck fracture without displacement or angulation. No other acute fracture or dislocation. Normal alignment. No joint effusion. Ligaments Ligaments are suboptimally evaluated by CT. Muscles and Tendons Muscles are normal. No muscle atrophy. No intramuscular fluid collection or hematoma. Soft tissue No fluid collection or hematoma. No soft tissue mass. Diverticulosis without evidence of diverticulitis. Partially visualized distended bladder. IMPRESSION: Acute nondisplaced left subcapital femoral neck fracture. Electronically Signed   By: Kathreen Devoid M.D.   On: 06/10/2022 13:44   CT HEAD WO CONTRAST (5MM)  Result Date: 06/10/2022 CLINICAL DATA:  Fall. EXAM: CT HEAD WITHOUT CONTRAST CT CERVICAL SPINE  WITHOUT CONTRAST TECHNIQUE: Multidetector CT imaging of the head and cervical spine was performed following the standard protocol without intravenous contrast. Multiplanar CT image reconstructions of the cervical spine were also generated. RADIATION DOSE REDUCTION: This exam was performed according to the departmental dose-optimization program which includes automated exposure control, adjustment of the mA and/or kV according to patient size and/or use of iterative reconstruction technique. COMPARISON:  April 03, 2022.  February 22, 2022. FINDINGS: CT HEAD FINDINGS Brain: Mild chronic ischemic white matter disease is noted. No mass effect or midline shift is noted. Ventricular size is within normal limits. There is no evidence of mass lesion, hemorrhage or acute infarction. Vascular: No hyperdense vessel or unexpected calcification. Skull: Normal. Negative for fracture or focal lesion. Sinuses/Orbits: No acute finding. Other: None. CT CERVICAL SPINE FINDINGS Alignment: Mild grade 1 anterolisthesis of C2-3 is noted which is stable compared to prior exam. Skull base and vertebrae: No acute fracture. No primary bone lesion or focal pathologic process. Soft tissues and spinal canal: No prevertebral fluid or swelling. No visible  canal hematoma. Disc levels: Status post surgical anterior fusion of C5-6 and C6-7. Severe degenerative disc disease is also noted at C4-5. There is near fusion of C3-4 most likely due to degenerative change. Upper chest: Negative. Other: None. IMPRESSION: No acute intracranial abnormality seen. Stable postsurgical and degenerative changes are seen involving the cervical spine. No acute abnormality is noted. Electronically Signed   By: Marijo Conception M.D.   On: 06/10/2022 12:07   CT Cervical Spine Wo Contrast  Result Date: 06/10/2022 CLINICAL DATA:  Fall. EXAM: CT HEAD WITHOUT CONTRAST CT CERVICAL SPINE WITHOUT CONTRAST TECHNIQUE: Multidetector CT imaging of the head and cervical spine was  performed following the standard protocol without intravenous contrast. Multiplanar CT image reconstructions of the cervical spine were also generated. RADIATION DOSE REDUCTION: This exam was performed according to the departmental dose-optimization program which includes automated exposure control, adjustment of the mA and/or kV according to patient size and/or use of iterative reconstruction technique. COMPARISON:  April 03, 2022.  February 22, 2022. FINDINGS: CT HEAD FINDINGS Brain: Mild chronic ischemic white matter disease is noted. No mass effect or midline shift is noted. Ventricular size is within normal limits. There is no evidence of mass lesion, hemorrhage or acute infarction. Vascular: No hyperdense vessel or unexpected calcification. Skull: Normal. Negative for fracture or focal lesion. Sinuses/Orbits: No acute finding. Other: None. CT CERVICAL SPINE FINDINGS Alignment: Mild grade 1 anterolisthesis of C2-3 is noted which is stable compared to prior exam. Skull base and vertebrae: No acute fracture. No primary bone lesion or focal pathologic process. Soft tissues and spinal canal: No prevertebral fluid or swelling. No visible canal hematoma. Disc levels: Status post surgical anterior fusion of C5-6 and C6-7. Severe degenerative disc disease is also noted at C4-5. There is near fusion of C3-4 most likely due to degenerative change. Upper chest: Negative. Other: None. IMPRESSION: No acute intracranial abnormality seen. Stable postsurgical and degenerative changes are seen involving the cervical spine. No acute abnormality is noted. Electronically Signed   By: Marijo Conception M.D.   On: 06/10/2022 12:07   DG Hip Unilat W or Wo Pelvis 2-3 Views Left  Result Date: 06/10/2022 CLINICAL DATA:  Fall, left hip pain EXAM: DG HIP (WITH OR WITHOUT PELVIS) 2-3V LEFT COMPARISON:  None Available. FINDINGS: Subtle, angulated subcapital fracture of the left femoral neck. Osteopenia. No obvious displaced fracture or  dislocation of the pelvis or proximal right femur seen in single frontal view. IMPRESSION: 1. Subtle, angulated subcapital fracture of the left femoral neck. 2. No obvious displaced fracture or dislocation of the pelvis or proximal right femur seen in single frontal view. 3. Osteopenia. Electronically Signed   By: Delanna Ahmadi M.D.   On: 06/10/2022 11:53   DG Forearm Left  Result Date: 06/10/2022 CLINICAL DATA:  Status post fall.  Complains of left forearm pain. EXAM: LEFT FOREARM - 2 VIEW COMPARISON:  None Available. FINDINGS: There is no evidence of fracture or other focal bone lesions. Soft tissues are unremarkable. IMPRESSION: Negative. Electronically Signed   By: Kerby Moors M.D.   On: 06/10/2022 11:49    Review of Systems Blood pressure (!) 141/124, pulse 88, temperature 98.5 F (36.9 C), resp. rate 20, height '5\' 3"'$  (1.6 m), weight 78.1 kg, SpO2 92 %. Physical Exam   Pain left hip no deformity  Assessment/Plan: Left hip fracture   Rec orif left hip  The procedure has been fully reviewed with the patient; The risks and benefits of surgery have  been discussed and explained and understood. Alternative treatment has also been reviewed, questions were encouraged and answered. The postoperative plan is also been reviewed.   Arther Abbott 06/11/2022, 12:29 PM

## 2022-06-11 NOTE — Progress Notes (Signed)
Initial Nutrition Assessment  DOCUMENTATION CODES:      INTERVENTION:  RD will follow for diet advancement and add ONS as appropriate   NUTRITION DIAGNOSIS:   Inadequate oral intake related to inability to eat as evidenced by NPO status (due to pending hip surgery).   GOAL:  Patient will meet greater than or equal to 90% of their needs   MONITOR:  Diet advancement, Labs, Weight trends, Skin  REASON FOR ASSESSMENT:   Consult Assessment of nutrition requirement/status  ASSESSMENT: Patient is a 76 yo female with COPD, HTN and PSVT. Status post mechanical fall with fracture of left femur.   Patient NPO for surgery today per MD. Patient eating as usual prior to fall. No recent change in eating pattern or weight loss. Able to feed herself.   Weights ranging between 75-78 kg the past 5+ months.   Medications: Protonix, Lipitor     Latest Ref Rng & Units 06/11/2022    3:25 AM 06/10/2022    9:55 AM 02/22/2022    1:23 PM  BMP  Glucose 70 - 99 mg/dL 119  101  129   BUN 8 - 23 mg/dL '14  16  25   '$ Creatinine 0.44 - 1.00 mg/dL 0.72  0.82  0.83   Sodium 135 - 145 mmol/L 131  134  134   Potassium 3.5 - 5.1 mmol/L 3.4  3.6  2.8   Chloride 98 - 111 mmol/L 98  99  102   CO2 22 - 32 mmol/L '23  26  28   '$ Calcium 8.9 - 10.3 mg/dL 8.6  8.8  8.7       NUTRITION - FOCUSED PHYSICAL EXAM: Deferred- patient involved with nursing -surgery prep    Diet Order:   Diet Order             Diet NPO time specified  Diet effective midnight                   EDUCATION NEEDS:  Not appropriate for education at this time  Skin:  Skin Assessment: Reviewed RN Assessment  Last BM:  9/27  Height:   Ht Readings from Last 1 Encounters:  06/10/22 '5\' 3"'$  (1.6 m)    Weight:   Wt Readings from Last 1 Encounters:  06/10/22 78.1 kg    Ideal Body Weight:   52 kg  BMI:  Body mass index is 30.5 kg/m.  Estimated Nutritional Needs:   Kcal:  1500-1700  Protein:  100-105 gr  Fluid:   >1500 ml daily   Colman Cater MS,RD,CSG,LDN Contact:AMION

## 2022-06-11 NOTE — Anesthesia Preprocedure Evaluation (Signed)
Anesthesia Evaluation  Patient identified by MRN, date of birth, ID band Patient awake    Reviewed: Allergy & Precautions, H&P , NPO status , Patient's Chart, lab work & pertinent test results, reviewed documented beta blocker date and time   Airway Mallampati: II  TM Distance: >3 FB Neck ROM: full    Dental no notable dental hx.    Pulmonary COPD,    Pulmonary exam normal breath sounds clear to auscultation       Cardiovascular Exercise Tolerance: Good hypertension, + CAD, + Peripheral Vascular Disease and +CHF   Rhythm:regular Rate:Normal     Neuro/Psych PSYCHIATRIC DISORDERS Anxiety Depression negative neurological ROS     GI/Hepatic Neg liver ROS, GERD  Medicated,  Endo/Other  negative endocrine ROS  Renal/GU CRFRenal disease  negative genitourinary   Musculoskeletal   Abdominal   Peds  Hematology negative hematology ROS (+)   Anesthesia Other Findings 1. Left ventricular ejection fraction, by estimation, is 35 to 40%. The  left ventricle has moderately decreased function. The left ventricle  demonstrates regional wall motion abnormalities (see scoring  diagram/findings for description). Left ventricular  diastolic parameters are consistent with Grade I diastolic dysfunction  (impaired relaxation). Abnormal global longitudinal strain of -9.8%.  2. Right ventricular systolic function is mildly reduced. The right  ventricular size is normal. There is normal pulmonary artery systolic  pressure. The estimated right ventricular systolic pressure is 26.3 mmHg.  3. Left atrial size was moderately dilated.  4. The mitral valve is abnormal. Trivial mitral valve regurgitation.  5. The aortic valve is tricuspid. Aortic valve regurgitation is mild to  moderate. Aortic regurgitation PHT measures 772 msec.  6. Aortic dilatation noted. There is moderate dilatation of the ascending  aorta, measuring 44 mm.  7. The  inferior vena cava is normal in size with greater than 50%  respiratory variability, suggesting right atrial pressure of 3 mmHg.   Reproductive/Obstetrics negative OB ROS                             Anesthesia Physical Anesthesia Plan  ASA: 3  Anesthesia Plan: Spinal   Post-op Pain Management:    Induction:   PONV Risk Score and Plan: Propofol infusion  Airway Management Planned:   Additional Equipment:   Intra-op Plan:   Post-operative Plan:   Informed Consent: I have reviewed the patients History and Physical, chart, labs and discussed the procedure including the risks, benefits and alternatives for the proposed anesthesia with the patient or authorized representative who has indicated his/her understanding and acceptance.     Dental Advisory Given  Plan Discussed with: CRNA  Anesthesia Plan Comments:         Anesthesia Quick Evaluation

## 2022-06-11 NOTE — Brief Op Note (Signed)
06/11/2022  1:56 PM  PATIENT:  Michelle Zavala  76 y.o. female  PRE-OPERATIVE DIAGNOSIS:  Left hip fracture left femoral neck  POST-OPERATIVE DIAGNOSIS:  Left hip fracture left femoral neck  PROCEDURE:  Procedure(s): PERCUTANEOUS FIXATION OF FEMORAL NECK (Left)  SURGEON:  Surgeon(s) and Role:    Carole Civil, MD - Primary  Nondisplaced femoral neck fracture left hip  Implants fully threaded cannulated screws x3 with washers, [employer]  PHYSICIAN ASSISTANT:   ASSISTANTS: no  ANESTHESIA:   spinal  EBL:  50 mL   BLOOD ADMINISTERED:none  DRAINS: none   LOCAL MEDICATIONS USED:  MARCAINE     SPECIMEN:  No Specimen  DISPOSITION OF SPECIMEN:  N/A  COUNTS:  YES  TOURNIQUET:  * No tourniquets in log *  DICTATION: .Dragon Dictation  PLAN OF CARE: Admit to inpatient   PATIENT DISPOSITION:  PACU - hemodynamically stable.   Delay start of Pharmacological VTE agent (>24hrs) due to surgical blood loss or risk of bleeding: yes

## 2022-06-11 NOTE — Progress Notes (Signed)
  Transition of Care [facility]) Screening Note   Patient Details  Name: Michelle Zavala Date of Birth: November 05, 1945   Transition of Care [facility]) CM/SW Contact:    Ihor Gully, LCSW Phone Number: 06/11/2022, 11:14 AM    Transition of Care Department [facility]) has reviewed patient and no TOC needs have been identified at this time. We will continue to monitor patient advancement through interdisciplinary progression rounds. If new patient transition needs arise, please place a TOC consult.

## 2022-06-11 NOTE — Care Management Important Message (Signed)
Important Message  Patient Details  Name: Michelle Zavala MRN: [id number] Date of Birth: 05/16/1946   Medicare Important Message Given:  Yes     Tommy Medal 06/11/2022, 11:14 AM

## 2022-06-11 NOTE — Consult Note (Signed)
Reason for Consult:left hip pain  Referring Physician: er   Cailie Bosshart is an 76 y.o. female.  HPI: fell going down stairs   Past Medical History:  Diagnosis Date   Anxiety    Arthritis    C2 cervical fracture ([place]) 09/17/2013   Traumatic fracture witth minimal displacement   Cardiomyopathy ([place])    a. EF 35-40% by echo in 08/2021   Chronic lung disease    Fibrosis - Dr. Koleen Nimrod   COPD (chronic obstructive pulmonary disease) ([place])    Coronary atherosclerosis of native coronary artery    a. Mild atherosclerosis by cath in 2010 b. NST in 07/2021 showing fixed defects but EF at 41% by NST and 35-40% by echo   Depression    Diverticulosis    Essential hypertension    GERD (gastroesophageal reflux disease)    PSVT (paroxysmal supraventricular tachycardia) ([place])     Past Surgical History:  Procedure Laterality Date   ABDOMINAL HYSTERECTOMY     ANTERIOR RELEASE VERTEBRAL BODY W/ POSTERIOR FUSION     BACTERIAL OVERGROWTH TEST N/A 02/19/2016   Procedure: BACTERIAL OVERGROWTH TEST;  Surgeon: Daneil Dolin, MD;  [facility];  Service: Endoscopy;  Laterality: N/A;  0700   BIOPSY N/A 08/04/2015   Procedure: BIOPSY;  Surgeon: Daneil Dolin, MD;  Location: [facility];  Service: Endoscopy;  Laterality: N/A;  Gastric   BIOPSY  02/21/2017   Procedure: BIOPSY;  Surgeon: Daneil Dolin, MD;  [facility];  Service: Endoscopy;;  ascending colon biopsy    BIOPSY  08/21/2020   Procedure: BIOPSY;  Surgeon: Daneil Dolin, MD;  [facility];  Service: Endoscopy;;   cataract surgery     [place]   COLONOSCOPY  June 2016   Dr. Britta Mccreedy: moderate diverticulosis in sigmoid colon, surveillance in 5 years    COLONOSCOPY WITH PROPOFOL N/A 02/21/2017   Dr. Gala Romney: diverticulosis in sigmoid colon, tubular adenomas, segmental biopsies benign. Surveillance in 5 years    ESOPHAGOGASTRODUODENOSCOPY (EGD) WITH PROPOFOL N/A 08/04/2015   Dr.  Rourk:abnormal gastric mucosa s/p biopsy. Reactive gastropathy. Negative H.pylori   ESOPHAGOGASTRODUODENOSCOPY (EGD) WITH PROPOFOL N/A 02/21/2017   Dr. Gala Romney: empiric esophageal dilatation, small hiatal hernia, otherwise normal   ESOPHAGOGASTRODUODENOSCOPY (EGD) WITH PROPOFOL N/A 08/21/2020   normal esophagus s/p dilation. Erythematous mucosa in stomach of doubtful clinical significant s/p biopsy. Negative H.pylori. Mild reactive gastropathy.    INCISIONAL HERNIA REPAIR N/A 12/03/2015   Procedure: [name] HERNIORRHAPHY WITH MESH;  Surgeon: Aviva Signs, MD;  Location: [facility];  Service: General;  Laterality: N/A;   INSERTION OF MESH  12/03/2015   Procedure: INSERTION OF MESH;  Surgeon: Aviva Signs, MD;  Location: [facility];  Service: General;;   IR RADIOLOGIST EVAL & MGMT  09/22/2017   MALONEY DILATION N/A 02/21/2017   Procedure: [name] DILATION;  Surgeon: Daneil Dolin, MD;  [facility];  Service: Endoscopy;  Laterality: N/A;   MALONEY DILATION N/A 08/21/2020   Procedure: [name] DILATION;  Surgeon: Daneil Dolin, MD;  [facility];  Service: Endoscopy;  Laterality: N/A;   POLYPECTOMY  02/21/2017   Procedure: POLYPECTOMY;  Surgeon: Daneil Dolin, MD;  [facility];  Service: Endoscopy;;  hepatic flexure polyp cs   RIGHT/LEFT HEART CATH AND CORONARY ANGIOGRAPHY N/A 09/17/2021   Procedure: RIGHT/LEFT HEART CATH AND CORONARY ANGIOGRAPHY;  Surgeon: Nelva Bush, MD;  Location: [facility];  Service: Cardiovascular;  Laterality: N/A;   TMJ ARTHROSCOPY Bilateral 02/04/2015   Procedure: BILATERAL TEMPOROMANDIBULAR JOINT (TMJ) ARTHROSCOPY MENISECTOMY WITH FAT GRAFT FROM ABDOMEN ;  Surgeon: Diona Browner, DDS;  Location: [facility];  Service: Oral Surgery;  Laterality: Bilateral;   TOTAL KNEE ARTHROPLASTY Bilateral    TUBAL LIGATION      Family History  Problem Relation Age of Onset   Coronary artery disease Father        Premature   Heart disease Father     Coronary artery disease Brother        Premature   Coronary artery disease Sister        Premature   Colon cancer Son     Social History:  reports that she has never smoked. She has never been exposed to tobacco smoke. She has never used smokeless tobacco. She reports current alcohol use. She reports that she does not use drugs.  Allergies:  Allergies  Allergen Reactions   Sulfonamide Derivatives Hives    Medications:  Current Outpatient Medications  Medication Instructions   acetaminophen (TYLENOL) 1,300 mg, Oral, Every 8 hours PRN   albuterol (VENTOLIN HFA) 108 (90 Base) MCG/ACT inhaler 2 puffs, Inhalation, Every 4 hours PRN   amitriptyline (ELAVIL) 75 mg, Oral, Daily at bedtime   atorvastatin (LIPITOR) 40 mg, Oral, Daily   Cyanocobalamin (VITAMIN B-12) 5000 MCG TBDP 1 tablet, Oral, Daily   hydrochlorothiazide (HYDRODIURIL) 25 mg, Oral, Every morning   ipratropium (ATROVENT) 0.5 mg, Nebulization, Every 4 hours PRN   Linzess 145 mcg, Oral, Daily   LORazepam (ATIVAN) 1 mg, Oral, 2 times daily   Magnesium Oxide -Mg Supplement 500 mg, Oral, Daily   meclizine (ANTIVERT) 25 mg, Oral, 3 times daily PRN   Melatonin 10 MG TABS 2 tablets, Oral, Daily at bedtime   meloxicam (MOBIC) 15 mg, Daily PRN   metoprolol succinate (TOPROL-XL) 75 mg, Oral, Daily, Take with or immediately following a meal.   Multiple Vitamins-Minerals (PRESERVISION AREDS 2) CAPS 2 Capfuls, Oral, Daily   pantoprazole (PROTONIX) 40 mg, Oral, 2 times daily   potassium chloride SA (KLOR-CON M) 20 MEQ tablet 40 mEq, Oral, Daily   rosuvastatin (CRESTOR) 20 mg, Oral, Daily   sertraline (ZOLOFT) 100 mg, Oral, Daily,       Results for orders placed or performed during the hospital encounter of 06/10/22 (from the past 48 hour(s))  CBC with Differential     Status: Abnormal   Collection Time: 06/10/22  9:55 AM  Result Value Ref Range   WBC 9.5 4.0 - 10.5 K/uL   RBC 3.75 (L) 3.87 - 5.11 MIL/uL   Hemoglobin 12.0 12.0 -  15.0 g/dL   HCT 34.3 (L) 36.0 - 46.0 %   MCV 91.5 80.0 - 100.0 fL   MCH 32.0 26.0 - 34.0 pg   MCHC 35.0 30.0 - 36.0 g/dL   RDW 12.4 11.5 - 15.5 %   Platelets 240 150 - 400 K/uL   nRBC 0.0 0.0 - 0.2 %   Neutrophils Relative % 71 %   Neutro Abs 6.7 1.7 - 7.7 K/uL   Lymphocytes Relative 16 %   Lymphs Abs 1.5 0.7 - 4.0 K/uL   Monocytes Relative 10 %   Monocytes Absolute 0.9 0.1 - 1.0 K/uL   Eosinophils Relative 1 %   Eosinophils Absolute 0.1 0.0 - 0.5 K/uL   Basophils Relative 0 %   Basophils Absolute 0.0 0.0 - 0.1 K/uL   Immature Granulocytes 2 %  Abs Immature Granulocytes 0.18 (H) 0.00 - 0.07 K/uL    Comment: Performed at [facility], [address] [place], [place] [address]  Comprehensive metabolic panel     Status: Abnormal   Collection Time: 06/10/22  9:55 AM  Result Value Ref Range   Sodium 134 (L) 135 - 145 mmol/L   Potassium 3.6 3.5 - 5.1 mmol/L   Chloride 99 98 - 111 mmol/L   CO2 26 22 - 32 mmol/L   Glucose, Bld 101 (H) 70 - 99 mg/dL    Comment: Glucose reference range applies only to samples taken after fasting for at least 8 hours.   BUN 16 8 - 23 mg/dL   Creatinine, Ser 0.82 0.44 - 1.00 mg/dL   Calcium 8.8 (L) 8.9 - 10.3 mg/dL   Total Protein 6.4 (L) 6.5 - 8.1 g/dL   Albumin 3.5 3.5 - 5.0 g/dL   AST 21 15 - 41 U/L   ALT 18 0 - 44 U/L   Alkaline Phosphatase 61 38 - 126 U/L   Total Bilirubin 0.9 0.3 - 1.2 mg/dL   GFR, Estimated >60 >60 mL/min    Comment: (NOTE) Calculated using the CKD-EPI Creatinine Equation (2021)    Anion gap 9 5 - 15    Comment: Performed at [facility], [address], [place], [place] [address]  Magnesium     Status: Abnormal   Collection Time: 06/10/22  4:14 PM  Result Value Ref Range   Magnesium 1.6 (L) 1.7 - 2.4 mg/dL    Comment: Performed at [facility], [address] [place], [place] [address]  Surgical PCR screen     Status: Abnormal   Collection Time: 06/11/22  3:23 AM   Specimen: Nasal Mucosa; Nasal Swab  Result Value Ref  Range   MRSA, PCR POSITIVE (A) NEGATIVE    Comment: RESULT CALLED TO, READ BACK BY AND VERIFIED WITH: ROBERTSON B. AT [id number] ON [id number] BY THOMPSON S.    Staphylococcus aureus POSITIVE (A) NEGATIVE    Comment: RESULT CALLED TO, READ BACK BY AND VERIFIED WITH: ROBERTSON B. AT [id number] ON [id number] BY THOMPSON S. (NOTE) The Xpert SA Assay (FDA approved for NASAL specimens in patients 87 years of age and older), is one component of a comprehensive surveillance program. It is not intended to diagnose infection nor to guide or monitor treatment. Performed at [facility], [address], [place], [place] [address]   CBC     Status: Abnormal   Collection Time: 06/11/22  3:25 AM  Result Value Ref Range   WBC 10.3 4.0 - 10.5 K/uL   RBC 3.83 (L) 3.87 - 5.11 MIL/uL   Hemoglobin 12.0 12.0 - 15.0 g/dL   HCT 35.4 (L) 36.0 - 46.0 %   MCV 92.4 80.0 - 100.0 fL   MCH 31.3 26.0 - 34.0 pg   MCHC 33.9 30.0 - 36.0 g/dL   RDW 12.7 11.5 - 15.5 %   Platelets 248 150 - 400 K/uL   nRBC 0.0 0.0 - 0.2 %    Comment: Performed at [facility], [address] [place],  [address]  Basic metabolic panel     Status: Abnormal   Collection Time: 06/11/22  3:25 AM  Result Value Ref Range   Sodium 131 (L) 135 - 145 mmol/L   Potassium 3.4 (L) 3.5 - 5.1 mmol/L   Chloride 98 98 - 111 mmol/L   CO2 23 22 - 32 mmol/L   Glucose, Bld 119 (H) 70 - 99  mg/dL    Comment: Glucose reference range applies only to samples taken after fasting for at least 8 hours.   BUN 14 8 - 23 mg/dL   Creatinine, Ser 0.72 0.44 - 1.00 mg/dL   Calcium 8.6 (L) 8.9 - 10.3 mg/dL   GFR, Estimated >60 >60 mL/min    Comment: (NOTE) Calculated using the CKD-EPI Creatinine Equation (2021)    Anion gap 10 5 - 15    Comment: Performed at [facility], [address] [place], [place] [address]  Magnesium     Status: None   Collection Time: 06/11/22  3:25 AM  Result Value Ref Range   Magnesium 2.0 1.7 - 2.4 mg/dL    Comment: Performed at [facility], [address], [place], [place] [address]    DG CHEST PORT 1 VIEW  Result Date: 06/10/2022 CLINICAL DATA:  Fall, hip fracture in a 76 year old female. EXAM: PORTABLE CHEST 1 VIEW COMPARISON:  June 09, 2022. FINDINGS: EKG leads project over the chest. Cardiomediastinal contours and hilar structures are stable with cardiac enlargement. Lungs are clear. No sign of effusion. No pneumothorax. On limited assessment there is no acute skeletal process. IMPRESSION: No active disease. Electronically Signed   By: Zetta Bills M.D.   On: 06/10/2022 17:00   CT Hip Left Wo Contrast  Result Date: 06/10/2022 CLINICAL DATA:  Status post fall, left hip pain EXAM: CT OF THE LEFT HIP WITHOUT CONTRAST TECHNIQUE: Multidetector CT imaging of the left hip was performed according to the standard protocol. Multiplanar CT image reconstructions were also generated. RADIATION DOSE REDUCTION: This exam was performed according to the departmental dose-optimization program which includes automated exposure control, adjustment of the mA and/or kV according to patient size and/or use of iterative reconstruction technique. COMPARISON:  None Available. FINDINGS: Bones/Joint/Cartilage Acute nondisplaced left subcapital femoral neck fracture without displacement or angulation. No other acute fracture or dislocation. Normal alignment. No joint effusion. Ligaments Ligaments are suboptimally evaluated by CT. Muscles and Tendons Muscles are normal. No muscle atrophy. No intramuscular fluid collection or hematoma. Soft tissue No fluid collection or hematoma. No soft tissue mass. Diverticulosis without evidence of diverticulitis. Partially visualized distended bladder. IMPRESSION: Acute nondisplaced left subcapital femoral neck fracture. Electronically Signed   By: Kathreen Devoid M.D.   On: 06/10/2022 13:44   CT HEAD WO CONTRAST (5MM)  Result Date: 06/10/2022 CLINICAL DATA:  Fall. EXAM: CT HEAD WITHOUT CONTRAST CT CERVICAL SPINE  WITHOUT CONTRAST TECHNIQUE: Multidetector CT imaging of the head and cervical spine was performed following the standard protocol without intravenous contrast. Multiplanar CT image reconstructions of the cervical spine were also generated. RADIATION DOSE REDUCTION: This exam was performed according to the departmental dose-optimization program which includes automated exposure control, adjustment of the mA and/or kV according to patient size and/or use of iterative reconstruction technique. COMPARISON:  April 03, 2022.  February 22, 2022. FINDINGS: CT HEAD FINDINGS Brain: Mild chronic ischemic white matter disease is noted. No mass effect or midline shift is noted. Ventricular size is within normal limits. There is no evidence of mass lesion, hemorrhage or acute infarction. Vascular: No hyperdense vessel or unexpected calcification. Skull: Normal. Negative for fracture or focal lesion. Sinuses/Orbits: No acute finding. Other: None. CT CERVICAL SPINE FINDINGS Alignment: Mild grade 1 anterolisthesis of C2-3 is noted which is stable compared to prior exam. Skull base and vertebrae: No acute fracture. No primary bone lesion or focal pathologic process. Soft tissues and spinal canal: No prevertebral fluid or swelling. No visible  canal hematoma. Disc levels: Status post surgical anterior fusion of C5-6 and C6-7. Severe degenerative disc disease is also noted at C4-5. There is near fusion of C3-4 most likely due to degenerative change. Upper chest: Negative. Other: None. IMPRESSION: No acute intracranial abnormality seen. Stable postsurgical and degenerative changes are seen involving the cervical spine. No acute abnormality is noted. Electronically Signed   By: Marijo Conception M.D.   On: 06/10/2022 12:07   CT Cervical Spine Wo Contrast  Result Date: 06/10/2022 CLINICAL DATA:  Fall. EXAM: CT HEAD WITHOUT CONTRAST CT CERVICAL SPINE WITHOUT CONTRAST TECHNIQUE: Multidetector CT imaging of the head and cervical spine was  performed following the standard protocol without intravenous contrast. Multiplanar CT image reconstructions of the cervical spine were also generated. RADIATION DOSE REDUCTION: This exam was performed according to the departmental dose-optimization program which includes automated exposure control, adjustment of the mA and/or kV according to patient size and/or use of iterative reconstruction technique. COMPARISON:  April 03, 2022.  February 22, 2022. FINDINGS: CT HEAD FINDINGS Brain: Mild chronic ischemic white matter disease is noted. No mass effect or midline shift is noted. Ventricular size is within normal limits. There is no evidence of mass lesion, hemorrhage or acute infarction. Vascular: No hyperdense vessel or unexpected calcification. Skull: Normal. Negative for fracture or focal lesion. Sinuses/Orbits: No acute finding. Other: None. CT CERVICAL SPINE FINDINGS Alignment: Mild grade 1 anterolisthesis of C2-3 is noted which is stable compared to prior exam. Skull base and vertebrae: No acute fracture. No primary bone lesion or focal pathologic process. Soft tissues and spinal canal: No prevertebral fluid or swelling. No visible canal hematoma. Disc levels: Status post surgical anterior fusion of C5-6 and C6-7. Severe degenerative disc disease is also noted at C4-5. There is near fusion of C3-4 most likely due to degenerative change. Upper chest: Negative. Other: None. IMPRESSION: No acute intracranial abnormality seen. Stable postsurgical and degenerative changes are seen involving the cervical spine. No acute abnormality is noted. Electronically Signed   By: Marijo Conception M.D.   On: 06/10/2022 12:07   DG Hip Unilat W or Wo Pelvis 2-3 Views Left  Result Date: 06/10/2022 CLINICAL DATA:  Fall, left hip pain EXAM: DG HIP (WITH OR WITHOUT PELVIS) 2-3V LEFT COMPARISON:  None Available. FINDINGS: Subtle, angulated subcapital fracture of the left femoral neck. Osteopenia. No obvious displaced fracture or  dislocation of the pelvis or proximal right femur seen in single frontal view. IMPRESSION: 1. Subtle, angulated subcapital fracture of the left femoral neck. 2. No obvious displaced fracture or dislocation of the pelvis or proximal right femur seen in single frontal view. 3. Osteopenia. Electronically Signed   By: Delanna Ahmadi M.D.   On: 06/10/2022 11:53   DG Forearm Left  Result Date: 06/10/2022 CLINICAL DATA:  Status post fall.  Complains of left forearm pain. EXAM: LEFT FOREARM - 2 VIEW COMPARISON:  None Available. FINDINGS: There is no evidence of fracture or other focal bone lesions. Soft tissues are unremarkable. IMPRESSION: Negative. Electronically Signed   By: Kerby Moors M.D.   On: 06/10/2022 11:49    Review of Systems Blood pressure (!) 141/124, pulse 88, temperature 98.5 F (36.9 C), resp. rate 20, height '5\' 3"'$  (1.6 m), weight 78.1 kg, SpO2 92 %. Physical Exam   Pain left hip no deformity  Assessment/Plan: Left hip fracture   Rec orif left hip  The procedure has been fully reviewed with the patient; The risks and benefits of surgery have  been discussed and explained and understood. Alternative treatment has also been reviewed, questions were encouraged and answered. The postoperative plan is also been reviewed.   Arther Abbott 06/11/2022, 12:29 PM

## 2022-06-11 NOTE — Transfer of Care (Signed)
Immediate Anesthesia Transfer of Care Note  Patient: Michelle Zavala  Procedure(s) Performed: PERCUTANEOUS FIXATION OF FEMORAL NECK (Left: Hip)  Patient Location: PACU  Anesthesia Type:Spinal  Level of Consciousness: awake, alert , oriented and patient cooperative  Airway & Oxygen Therapy: Patient Spontanous Breathing and Patient connected to nasal cannula oxygen  Post-op Assessment: Report given to RN and Post -op Vital signs reviewed and stable  Post vital signs: Reviewed and stable  Last Vitals:  Vitals Value Taken Time  BP    Temp    Pulse 98 06/11/22 1409  Resp 25 06/11/22 1409  SpO2 91 % 06/11/22 1409  Vitals shown include unvalidated device data.  Last Pain:  Vitals:   06/11/22 1209  TempSrc:   PainSc: 3       Patients Stated Pain Goal: 2 (49/32/41 9914)  Complications: No notable events documented.

## 2022-06-11 NOTE — Assessment & Plan Note (Signed)
-   IV replacement - Mg repleted

## 2022-06-11 NOTE — Progress Notes (Signed)
Patient given morphine for pain this, and patient received compazine for nausea. Patient npo since midnight and she slept through the night with exception when needed pain med and for vital checks.

## 2022-06-11 NOTE — Interval H&P Note (Signed)
History and Physical Interval Note:  06/11/2022 12:31 PM  Michelle Zavala  has presented today for surgery, with the diagnosis of Left hip fracture left femoral neck.  The various methods of treatment have been discussed with the patient and family. After consideration of risks, benefits and other options for treatment, the patient has consented to  Procedure(s): PERCUTANEOUS FIXATION OF FEMORAL NECK (Left) as a surgical intervention.  The patient's history has been reviewed, patient examined, no change in status, stable for surgery.  I have reviewed the patient's chart and labs.  Questions were answered to the patient's satisfaction.     Arther Abbott

## 2022-06-11 NOTE — Assessment & Plan Note (Signed)
repleted ?

## 2022-06-11 NOTE — Op Note (Signed)
06/11/2022  1:56 PM  PATIENT:  Michelle Zavala  76 y.o. female  PRE-OPERATIVE DIAGNOSIS:  Left hip fracture left femoral neck  POST-OPERATIVE DIAGNOSIS:  Left hip fracture left femoral neck  PROCEDURE:  Procedure(s): PERCUTANEOUS FIXATION OF FEMORAL NECK (Left)  SURGEON:  Surgeon(s) and Role:    Carole Civil, MD - Primary  Nondisplaced femoral neck fracture left hip  Implants fully threaded cannulated screws x3 with washers, [employer]  Details of procedure  The patient was seen in the preop area the left hip was confirmed as the surgical site.  Her skin was checked her chart was reviewed I confirm the site and marked my initials over the left hip  She was taken to the operating for spinal anesthesia followed by insertion of Foley catheter  She was then placed on the fracture table.  We placed a well-padded perineal post we padded her right and left leg and placed in traction devices  The right leg was abducted the left leg was placed in gentle traction and internal rotation  The C-arm was brought in for imaging.  The fracture was reduced and stable.  Timeout was done after sterile prep and drape  The incision was made at the tip of the trochanter extended distally.  Subcutaneous tissue was divided down to the underlying fascia which was split in line with the skin incision.  We made an L-shaped incision with the inferior long limb and the vastus lateralis and then did subperiosteal dissection to expose the proximal femur.  We placed 3 parallel's bruised and inverted triangular configuration.  This was done with the C arm primarily in the lateral position once the appropriate angle was obtained  Once the pins were confirmed to be in good position we measured each 1 overdrill the cortex and then used a power drill to insert the screws hand tightening them with washers.  Her imaging showed that the screws were in good position the fracture remained reduced  The  wound was irrigated and closed in layered fashion repairing the vastus lateralis with running 0 Monocryl suture followed by fascial closure with #1 Braylon followed by subcutaneous tissue closure with 0 Monocryl and then staples to close the skin  We injected a total of 60 cc of Marcaine with epinephrine 30 cc subvastus and 30 cc in the skin and subcu tissue  Sterile dressing was applied  Plan  Weightbearing status as tolerated Staples out at 14 days Image is at 2, 6 and 12 weeks DV T prophylaxis 35 days  PHYSICIAN ASSISTANT:   ASSISTANTS: no  ANESTHESIA:   spinal  EBL:  50 mL   BLOOD ADMINISTERED:none  DRAINS: none   LOCAL MEDICATIONS USED:  MARCAINE     SPECIMEN:  No Specimen  DISPOSITION OF SPECIMEN:  N/A  COUNTS:  YES  TOURNIQUET:  * No tourniquets in log *  DICTATION: .Dragon Dictation  PLAN OF CARE: Admit to inpatient   PATIENT DISPOSITION:  PACU - hemodynamically stable.   Delay start of Pharmacological VTE agent (>24hrs) due to surgical blood loss or risk of bleeding: yes

## 2022-06-11 NOTE — Progress Notes (Signed)
PROGRESS NOTE   Michelle Zavala  PPI:[id number] DOB: Nov 04, 1945 DOA: 06/10/2022 PCP: Ralph Leyden, FNP   Chief Complaint  Patient presents with   Fall   Level of care: Telemetry  Brief Admission History:  76 y.o. female with medical history significant for  HTN, cardiomyopathy, COPD, PSVT. Patient went to gastroenterologist today, was leaving the office when she stepped of the curb wrong and fell landing on her left hip. She reports she hit her head.  She denies dizziness.  No chest pain or difficulty breathing.  Denies frequent falls.   She had complained to her gastroenterologist about intermittent melena.  She denies abdominal pain.  No vomiting of blood no blood in stools.  Plan was for outpatient colonoscopy and upper endoscopy in the near future.   ED Course: Stable vitals. Hgb at baseline. Head and cervical Ct without acute abnormalities.  Pelvic CT- Acute nondisplaced left subcapital femoral neck fracture.  EDP talked to Dr. Aline Brochure- plans for surgery 9/29.    Assessment and Plan: * Closed subcapital fracture of left femur (HCC) Status post mechanical fall.  CT of the left hip without contrast-she has an acute nondisplaced left subcapital femoral neck fracture. - EDP talked to Dr. Aline Brochure, plans for surgery 9/23 - N.p.o. for surgery today - IV morphine 2 mg every 4 hourly as needed  Hypokalemia - IV replacement - Mg repleted  Hypomagnesemia - repleted  Prolonged QT interval QT 545.  -repleted magnesium, now up to 2.0 -s/p KCl -Holding Zoloft and amitriptyline for now  COPD (chronic obstructive pulmonary disease) ([place]) -Stable -DuoNebs as needed  Senile purpura ([place]) She has multiple purpura mostly involving upper extremity of difference sizes.  Patient reports over the past 2 months.  Per care everywhere- 05/13/22, she has had extensive hematology evaluation for this-which failed to show any evidence of hemorrhagic disorder.  Per notes, she has senile purpura, and  the omega-3 fatty acids and BC powder(which she was initially taking a lot of) worsened the bruising associated with her senile purpura.     Cardiomyopathy ([place]) Stable and compensated.  Last echo 08/2019 -EF of 35 to 40%.  With grade 1 diastolic dysfunction, regional wall motion abnormalities of left ventricle.  Subsequent cath 09/2021 shows mild to moderate nonobstructive coronary artery disease. -Resumed metoprolol and HCTZ  Essential hypertension, benign -Stable. -Resume metoprolol, HCTZ  DVT prophylaxis: SCDs Code Status: Full  Family Communication:  Disposition: Status is: Inpatient Remains inpatient appropriate because: intensity of illness   Consultants:  Orthopedics  Procedures:  Pending  Antimicrobials:    Subjective: Pt reports pain is controlled.   Objective: Vitals:   06/10/22 1911 06/10/22 1938 06/10/22 2329 06/11/22 0429  BP: 134/85 (!) 143/88 (!) 144/70 135/77  Pulse: 87 78 88 91  Resp: 17 17    Temp:  98.3 F (36.8 C) 98.6 F (37 C) 98.7 F (37.1 C)  TempSrc:  Oral Oral Oral  SpO2: 97% 99% 93% 95%  Weight:  78.1 kg    Height:  '5\' 3"'$  (1.6 m)      Intake/Output Summary (Last 24 hours) at 06/11/2022 0959 Last data filed at 06/11/2022 0500 Gross per 24 hour  Intake 480 ml  Output 600 ml  Net -120 ml   Filed Weights   06/10/22 0945 06/10/22 1938  Weight: 77.6 kg 78.1 kg   Examination:  General exam: Appears calm and comfortable  Respiratory system: Clear to auscultation. Respiratory effort normal. Cardiovascular system: normal S1 & S2 heard. No JVD,  murmurs, rubs, gallops or clicks. No pedal edema. Gastrointestinal system: Abdomen is nondistended, soft and nontender. No organomegaly or masses felt. Normal bowel sounds heard. Central nervous system: Alert and oriented. No focal neurological deficits. Extremities: swollen painful left femur. Skin: diffuse purpura lesions bilateral UEs Psychiatry: Judgement and insight appear normal. Mood & affect  appropriate.   Data Reviewed: I have personally reviewed following labs and imaging studies  CBC: Recent Labs  Lab 06/10/22 0955 06/11/22 0325  WBC 9.5 10.3  NEUTROABS 6.7  --   HGB 12.0 12.0  HCT 34.3* 35.4*  MCV 91.5 92.4  PLT 240 161    Basic Metabolic Panel: Recent Labs  Lab 06/10/22 0955 06/10/22 1614 06/11/22 0325  NA 134*  --  131*  K 3.6  --  3.4*  CL 99  --  98  CO2 26  --  23  GLUCOSE 101*  --  119*  BUN 16  --  14  CREATININE 0.82  --  0.72  CALCIUM 8.8*  --  8.6*  MG  --  1.6* 2.0    CBG: No results for input(s): "GLUCAP" in the last 168 hours.  Recent Results (from the past 240 hour(s))  Surgical PCR screen     Status: Abnormal   Collection Time: 06/11/22  3:23 AM   Specimen: Nasal Mucosa; Nasal Swab  Result Value Ref Range Status   MRSA, PCR POSITIVE (A) NEGATIVE Final    Comment: RESULT CALLED TO, READ BACK BY AND VERIFIED WITH: ROBERTSON B. AT [id number] ON [id number] BY THOMPSON S.    Staphylococcus aureus POSITIVE (A) NEGATIVE Final    Comment: RESULT CALLED TO, READ BACK BY AND VERIFIED WITH: ROBERTSON B. AT [id number] ON [id number] BY THOMPSON S. (NOTE) The Xpert SA Assay (FDA approved for NASAL specimens in patients 10 years of age and older), is one component of a comprehensive surveillance program. It is not intended to diagnose infection nor to guide or monitor treatment. Performed at [facility], [address] [place], [place] [address]      Radiology Studies: DG CHEST PORT 1 VIEW  Result Date: 06/10/2022 CLINICAL DATA:  Fall, hip fracture in a 76 year old female. EXAM: PORTABLE CHEST 1 VIEW COMPARISON:  June 09, 2022. FINDINGS: EKG leads project over the chest. Cardiomediastinal contours and hilar structures are stable with cardiac enlargement. Lungs are clear. No sign of effusion. No pneumothorax. On limited assessment there is no acute skeletal process. IMPRESSION: No active disease. Electronically Signed   By: Zetta Bills M.D.   On:  06/10/2022 17:00   CT Hip Left Wo Contrast  Result Date: 06/10/2022 CLINICAL DATA:  Status post fall, left hip pain EXAM: CT OF THE LEFT HIP WITHOUT CONTRAST TECHNIQUE: Multidetector CT imaging of the left hip was performed according to the standard protocol. Multiplanar CT image reconstructions were also generated. RADIATION DOSE REDUCTION: This exam was performed according to the departmental dose-optimization program which includes automated exposure control, adjustment of the mA and/or kV according to patient size and/or use of iterative reconstruction technique. COMPARISON:  None Available. FINDINGS: Bones/Joint/Cartilage Acute nondisplaced left subcapital femoral neck fracture without displacement or angulation. No other acute fracture or dislocation. Normal alignment. No joint effusion. Ligaments Ligaments are suboptimally evaluated by CT. Muscles and Tendons Muscles are normal. No muscle atrophy. No intramuscular fluid collection or hematoma. Soft tissue No fluid collection or hematoma. No soft tissue mass. Diverticulosis without evidence of diverticulitis. Partially visualized distended bladder. IMPRESSION: Acute nondisplaced left subcapital femoral neck  fracture. Electronically Signed   By: Kathreen Devoid M.D.   On: 06/10/2022 13:44   CT HEAD WO CONTRAST (5MM)  Result Date: 06/10/2022 CLINICAL DATA:  Fall. EXAM: CT HEAD WITHOUT CONTRAST CT CERVICAL SPINE WITHOUT CONTRAST TECHNIQUE: Multidetector CT imaging of the head and cervical spine was performed following the standard protocol without intravenous contrast. Multiplanar CT image reconstructions of the cervical spine were also generated. RADIATION DOSE REDUCTION: This exam was performed according to the departmental dose-optimization program which includes automated exposure control, adjustment of the mA and/or kV according to patient size and/or use of iterative reconstruction technique. COMPARISON:  April 03, 2022.  February 22, 2022. FINDINGS: CT  HEAD FINDINGS Brain: Mild chronic ischemic white matter disease is noted. No mass effect or midline shift is noted. Ventricular size is within normal limits. There is no evidence of mass lesion, hemorrhage or acute infarction. Vascular: No hyperdense vessel or unexpected calcification. Skull: Normal. Negative for fracture or focal lesion. Sinuses/Orbits: No acute finding. Other: None. CT CERVICAL SPINE FINDINGS Alignment: Mild grade 1 anterolisthesis of C2-3 is noted which is stable compared to prior exam. Skull base and vertebrae: No acute fracture. No primary bone lesion or focal pathologic process. Soft tissues and spinal canal: No prevertebral fluid or swelling. No visible canal hematoma. Disc levels: Status post surgical anterior fusion of C5-6 and C6-7. Severe degenerative disc disease is also noted at C4-5. There is near fusion of C3-4 most likely due to degenerative change. Upper chest: Negative. Other: None. IMPRESSION: No acute intracranial abnormality seen. Stable postsurgical and degenerative changes are seen involving the cervical spine. No acute abnormality is noted. Electronically Signed   By: Marijo Conception M.D.   On: 06/10/2022 12:07   CT Cervical Spine Wo Contrast  Result Date: 06/10/2022 CLINICAL DATA:  Fall. EXAM: CT HEAD WITHOUT CONTRAST CT CERVICAL SPINE WITHOUT CONTRAST TECHNIQUE: Multidetector CT imaging of the head and cervical spine was performed following the standard protocol without intravenous contrast. Multiplanar CT image reconstructions of the cervical spine were also generated. RADIATION DOSE REDUCTION: This exam was performed according to the departmental dose-optimization program which includes automated exposure control, adjustment of the mA and/or kV according to patient size and/or use of iterative reconstruction technique. COMPARISON:  April 03, 2022.  February 22, 2022. FINDINGS: CT HEAD FINDINGS Brain: Mild chronic ischemic white matter disease is noted. No mass effect or  midline shift is noted. Ventricular size is within normal limits. There is no evidence of mass lesion, hemorrhage or acute infarction. Vascular: No hyperdense vessel or unexpected calcification. Skull: Normal. Negative for fracture or focal lesion. Sinuses/Orbits: No acute finding. Other: None. CT CERVICAL SPINE FINDINGS Alignment: Mild grade 1 anterolisthesis of C2-3 is noted which is stable compared to prior exam. Skull base and vertebrae: No acute fracture. No primary bone lesion or focal pathologic process. Soft tissues and spinal canal: No prevertebral fluid or swelling. No visible canal hematoma. Disc levels: Status post surgical anterior fusion of C5-6 and C6-7. Severe degenerative disc disease is also noted at C4-5. There is near fusion of C3-4 most likely due to degenerative change. Upper chest: Negative. Other: None. IMPRESSION: No acute intracranial abnormality seen. Stable postsurgical and degenerative changes are seen involving the cervical spine. No acute abnormality is noted. Electronically Signed   By: Marijo Conception M.D.   On: 06/10/2022 12:07   DG Hip Unilat W or Wo Pelvis 2-3 Views Left  Result Date: 06/10/2022 CLINICAL DATA:  Fall, left hip pain  EXAM: DG HIP (WITH OR WITHOUT PELVIS) 2-3V LEFT COMPARISON:  None Available. FINDINGS: Subtle, angulated subcapital fracture of the left femoral neck. Osteopenia. No obvious displaced fracture or dislocation of the pelvis or proximal right femur seen in single frontal view. IMPRESSION: 1. Subtle, angulated subcapital fracture of the left femoral neck. 2. No obvious displaced fracture or dislocation of the pelvis or proximal right femur seen in single frontal view. 3. Osteopenia. Electronically Signed   By: Delanna Ahmadi M.D.   On: 06/10/2022 11:53   DG Forearm Left  Result Date: 06/10/2022 CLINICAL DATA:  Status post fall.  Complains of left forearm pain. EXAM: LEFT FOREARM - 2 VIEW COMPARISON:  None Available. FINDINGS: There is no evidence of  fracture or other focal bone lesions. Soft tissues are unremarkable. IMPRESSION: Negative. Electronically Signed   By: Kerby Moors M.D.   On: 06/10/2022 11:49    Scheduled Meds:  atorvastatin  40 mg Oral Daily   hydrochlorothiazide  25 mg Oral q morning   LORazepam  1 mg Oral BID   metoprolol succinate  50 mg Oral Daily   mupirocin ointment  1 Application Nasal BID   pantoprazole  40 mg Oral BID   Continuous Infusions:  potassium chloride       LOS: 1 day   Time spent: 35 mins  Corine Solorio Wynetta Emery, MD How to contact the [facility] Attending or Consulting provider [place] or covering provider during after hours [place], for this patient?  Check the care team in [facility] and look for a) attending/consulting [facility] provider listed and b) the [facility] team listed Log into [email or web address] and use [place]'s universal password to access. If you do not have the password, please contact the hospital operator. Locate the [facility] provider you are looking for under [facility] and page to a number that you can be directly reached. If you still have difficulty reaching the provider, please page the [facility] (Director on Call) for the Hospitalists listed on amion for assistance.  06/11/2022, 9:59 AM

## 2022-06-12 DIAGNOSIS — E876 Hypokalemia: Secondary | ICD-10-CM | POA: Diagnosis not present

## 2022-06-12 DIAGNOSIS — S72012A Unspecified intracapsular fracture of left femur, initial encounter for closed fracture: Secondary | ICD-10-CM | POA: Diagnosis not present

## 2022-06-12 DIAGNOSIS — I429 Cardiomyopathy, unspecified: Secondary | ICD-10-CM | POA: Diagnosis not present

## 2022-06-12 DIAGNOSIS — I10 Essential (primary) hypertension: Secondary | ICD-10-CM | POA: Diagnosis not present

## 2022-06-12 MED ORDER — LORAZEPAM 1 MG PO TABS
1.0000 mg | ORAL_TABLET | Freq: Two times a day (BID) | ORAL | Status: DC
  Administered 2022-06-12 – 2022-06-14 (×5): 1 mg via ORAL
  Filled 2022-06-12 (×5): qty 1

## 2022-06-12 MED ORDER — POTASSIUM CHLORIDE CRYS ER 20 MEQ PO TBCR
20.0000 meq | EXTENDED_RELEASE_TABLET | Freq: Every day | ORAL | Status: DC
  Administered 2022-06-13 – 2022-06-14 (×2): 20 meq via ORAL
  Filled 2022-06-12 (×2): qty 1

## 2022-06-12 MED ORDER — ACETAMINOPHEN 325 MG PO TABS: 650.0000 mg | ORAL_TABLET | Freq: Four times a day (QID) | ORAL | Status: DC | PRN

## 2022-06-12 MED ORDER — POTASSIUM CHLORIDE CRYS ER 20 MEQ PO TBCR
40.0000 meq | EXTENDED_RELEASE_TABLET | Freq: Once | ORAL | Status: AC
  Administered 2022-06-12: 40 meq via ORAL
  Filled 2022-06-12: qty 2
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
## 2022-06-12 NOTE — Progress Notes (Signed)
Removed foley catheter. 36m removed from balloon. Foley removed with no complications. Patient tolerated well. Peri care was provided
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
## 2022-06-12 NOTE — Progress Notes (Signed)
Central tele called and reported heart rate in 150s. EKG performed and showed her heart rate to be in the 105 sinus tach. EKG placed in chart.

## 2022-06-12 NOTE — Progress Notes (Signed)
PROGRESS NOTE   Michelle Zavala  ZJQ:[id number] DOB: 08/01/1946 DOA: 06/10/2022 PCP: Ralph Leyden, FNP   Chief Complaint  Patient presents with   Fall   Level of care: Med-Surg  Brief Admission History:  76 y.o. female with medical history significant for  HTN, cardiomyopathy, COPD, PSVT. Patient went to gastroenterologist today, was leaving the office when she stepped of the curb wrong and fell landing on her left hip. She reports she hit her head.  She denies dizziness.  No chest pain or difficulty breathing.  Denies frequent falls.   She had complained to her gastroenterologist about intermittent melena.  She denies abdominal pain.  No vomiting of blood no blood in stools.  Plan was for outpatient colonoscopy and upper endoscopy in the near future.   ED Course: Stable vitals. Hgb at baseline. Head and cervical Ct without acute abnormalities.  Pelvic CT- Acute nondisplaced left subcapital femoral neck fracture.  EDP talked to Dr. Aline Brochure- plans for surgery 9/29.    Assessment and Plan: * Closed subcapital fracture of left femur (HCC) Status post mechanical fall.  CT of the left hip without contrast-she has an acute nondisplaced left subcapital femoral neck fracture. - EDP talked to Dr. Aline Brochure, plans for surgery 9/23 - IV morphine 2 mg every 4 hourly as needed - Postop ORIF on 9/29: continue postop care per ortho - PT recommending SNF   Hypokalemia - IV replacement - Mg repleted  Hypomagnesemia - repleted  Prolonged QT interval QT 545.  -repleted magnesium, now up to 2.0 -s/p KCl -Holding Zoloft and amitriptyline for now  COPD (chronic obstructive pulmonary disease) ([place]) -Stable -DuoNebs as needed  Senile purpura ([place]) She has multiple purpura mostly involving upper extremity of difference sizes.  Patient reports over the past 2 months.  Per care everywhere- 05/13/22, she has had extensive hematology evaluation for this-which failed to show any evidence of hemorrhagic  disorder.  Per notes, she has senile purpura, and the omega-3 fatty acids and BC powder(which she was initially taking a lot of) worsened the bruising associated with her senile purpura.     Cardiomyopathy ([place]) Stable and compensated.  Last echo 08/2019 -EF of 35 to 40%.  With grade 1 diastolic dysfunction, regional wall motion abnormalities of left ventricle.  Subsequent cath 09/2021 shows mild to moderate nonobstructive coronary artery disease. -Resumed metoprolol and HCTZ  Essential hypertension, benign -Stable. -Resume metoprolol, HCTZ  DVT prophylaxis: SCDs Code Status: Full  Family Communication:  Disposition: Status is: Inpatient Remains inpatient appropriate because: intensity of illness   Consultants:  Orthopedics  Procedures:  ORIF 9/29 Dr. Aline Brochure  Antimicrobials:    Subjective: Pt tolerated surgery well, pain controlled.    Objective: Vitals:   06/11/22 2112 06/12/22 0014 06/12/22 0610 06/12/22 0801  BP: (!) 157/87 (!) 153/87 (!) 153/78 (!) 160/84  Pulse: (!) 110 (!) 108 (!) 108 (!) 107  Resp:      Temp: 98.7 F (37.1 C) 98.9 F (37.2 C) 98.1 F (36.7 C)   TempSrc: Oral Oral Oral   SpO2: 94% 97% 93%   Weight:      Height:        Intake/Output Summary (Last 24 hours) at 06/12/2022 1236 Last data filed at 06/12/2022 0600 Gross per 24 hour  Intake 656.66 ml  Output 1600 ml  Net -943.34 ml   Filed Weights   06/10/22 0945 06/10/22 1938 06/11/22 1201  Weight: 77.6 kg 78.1 kg 78.1 kg   Examination:  General exam: Appears  calm and comfortable  Respiratory system: Clear to auscultation. Respiratory effort normal. Cardiovascular system: normal S1 & S2 heard. No JVD, murmurs, rubs, gallops or clicks. No pedal edema. Gastrointestinal system: Abdomen is nondistended, soft and nontender. No organomegaly or masses felt. Normal bowel sounds heard. Central nervous system: Alert and oriented. No focal neurological deficits. Extremities: bandages clean and  dry Skin: diffuse purpura lesions bilateral UEs Psychiatry: Judgement and insight appear normal. Mood & affect appropriate.   Data Reviewed: I have personally reviewed following labs and imaging studies  CBC: Recent Labs  Lab 06/10/22 0955 06/11/22 0325  WBC 9.5 10.3  NEUTROABS 6.7  --   HGB 12.0 12.0  HCT 34.3* 35.4*  MCV 91.5 92.4  PLT 240 301    Basic Metabolic Panel: Recent Labs  Lab 06/10/22 0955 06/10/22 1614 06/11/22 0325  NA 134*  --  131*  K 3.6  --  3.4*  CL 99  --  98  CO2 26  --  23  GLUCOSE 101*  --  119*  BUN 16  --  14  CREATININE 0.82  --  0.72  CALCIUM 8.8*  --  8.6*  MG  --  1.6* 2.0    CBG: No results for input(s): "GLUCAP" in the last 168 hours.  Recent Results (from the past 240 hour(s))  Surgical PCR screen     Status: Abnormal   Collection Time: 06/11/22  3:23 AM   Specimen: Nasal Mucosa; Nasal Swab  Result Value Ref Range Status   MRSA, PCR POSITIVE (A) NEGATIVE Final    Comment: RESULT CALLED TO, READ BACK BY AND VERIFIED WITH: ROBERTSON B. AT [id number] ON [id number] BY THOMPSON S.    Staphylococcus aureus POSITIVE (A) NEGATIVE Final    Comment: RESULT CALLED TO, READ BACK BY AND VERIFIED WITH: ROBERTSON B. AT [id number] ON [id number] BY THOMPSON S. (NOTE) The Xpert SA Assay (FDA approved for NASAL specimens in patients 19 years of age and older), is one component of a comprehensive surveillance program. It is not intended to diagnose infection nor to guide or monitor treatment. Performed at [facility], [address], [place], [place] [address]      Radiology Studies: DG HIP UNILAT WITH PELVIS 2-3 VIEWS LEFT  Result Date: 06/11/2022 CLINICAL DATA:  A 76 year old female presents for evaluation of percutaneous pinning of the LEFT hip post LEFT hip fracture EXAM: DG HIP (WITH OR WITHOUT PELVIS) 2-3V LEFT COMPARISON:  June 10, 2022. FINDINGS: Post placement of 3 screws across the LEFT femoral neck securing the previously identified  subcapital fracture. Gas present in the soft tissues about the LEFT hip. No unexpected immediate postprocedure findings. IMPRESSION: Status post percutaneous ORIF of LEFT femoral neck fracture without unexpected immediate postprocedure findings. Electronically Signed   By: Zetta Bills M.D.   On: 06/11/2022 15:48   DG HIP UNILAT WITH PELVIS 2-3 VIEWS LEFT  Result Date: 06/11/2022 CLINICAL DATA:  Surgical fixation of the left subcapital femoral neck fracture. EXAM: DG HIP (WITH OR WITHOUT PELVIS) 2-3V LEFT COMPARISON:  Left hip radiographs 06/10/2022 FINDINGS: Intraoperative images demonstrate placement of 3 compression screws through the left femoral head and neck. Left hip is located based on these images. IMPRESSION: Surgical fixation of the left femoral neck fracture. Electronically Signed   By: Markus Daft M.D.   On: 06/11/2022 14:13   DG C-Arm 1-60 Min-No Report  Result Date: 06/11/2022 Fluoroscopy was utilized by the requesting physician.  No radiographic interpretation.   DG CHEST PORT  1 VIEW  Result Date: 06/10/2022 CLINICAL DATA:  Fall, hip fracture in a 76 year old female. EXAM: PORTABLE CHEST 1 VIEW COMPARISON:  June 09, 2022. FINDINGS: EKG leads project over the chest. Cardiomediastinal contours and hilar structures are stable with cardiac enlargement. Lungs are clear. No sign of effusion. No pneumothorax. On limited assessment there is no acute skeletal process. IMPRESSION: No active disease. Electronically Signed   By: Zetta Bills M.D.   On: 06/10/2022 17:00   CT Hip Left Wo Contrast  Result Date: 06/10/2022 CLINICAL DATA:  Status post fall, left hip pain EXAM: CT OF THE LEFT HIP WITHOUT CONTRAST TECHNIQUE: Multidetector CT imaging of the left hip was performed according to the standard protocol. Multiplanar CT image reconstructions were also generated. RADIATION DOSE REDUCTION: This exam was performed according to the departmental dose-optimization program which includes  automated exposure control, adjustment of the mA and/or kV according to patient size and/or use of iterative reconstruction technique. COMPARISON:  None Available. FINDINGS: Bones/Joint/Cartilage Acute nondisplaced left subcapital femoral neck fracture without displacement or angulation. No other acute fracture or dislocation. Normal alignment. No joint effusion. Ligaments Ligaments are suboptimally evaluated by CT. Muscles and Tendons Muscles are normal. No muscle atrophy. No intramuscular fluid collection or hematoma. Soft tissue No fluid collection or hematoma. No soft tissue mass. Diverticulosis without evidence of diverticulitis. Partially visualized distended bladder. IMPRESSION: Acute nondisplaced left subcapital femoral neck fracture. Electronically Signed   By: Kathreen Devoid M.D.   On: 06/10/2022 13:44    Scheduled Meds:  aspirin EC  325 mg Oral Q breakfast   atorvastatin  40 mg Oral Daily   docusate sodium  100 mg Oral BID   hydrochlorothiazide  25 mg Oral q morning   LORazepam  1 mg Oral BID   metoprolol succinate  50 mg Oral Daily   mupirocin ointment  1 Application Nasal BID   pantoprazole  40 mg Oral BID   [START ON 06/13/2022] potassium chloride  20 mEq Oral Daily   Continuous Infusions:  methocarbamol (ROBAXIN) IV       LOS: 2 days   Time spent: 35 mins  Elanda Garmany Wynetta Emery, MD How to contact the [facility] Attending or Consulting provider [place] or covering provider during after hours [place], for this patient?  Check the care team in [facility] and look for a) attending/consulting TRH provider listed and b) the [facility] team listed Log into [email or web address] and use [place]'s universal password to access. If you do not have the password, please contact the hospital operator. Locate the [facility] provider you are looking for under [facility] and page to a number that you can be directly reached. If you still have difficulty reaching the provider, please page the [facility] (Director on Call) for the  Hospitalists listed on amion for assistance.  06/12/2022, 12:36 PM

## 2022-06-12 NOTE — Plan of Care (Signed)
  Problem: Acute Rehab PT Goals(only PT should resolve) Goal: Pt Will Go Supine/Side To Sit Outcome: Progressing Flowsheets (Taken 06/12/2022 1042) Pt will go Supine/Side to Sit: with supervision Goal: Patient Will Transfer Sit To/From Stand Outcome: Progressing Flowsheets (Taken 06/12/2022 1042) Patient will transfer sit to/from stand: with minimal assist Goal: Pt Will Transfer Bed To Chair/Chair To Bed Outcome: Progressing Flowsheets (Taken 06/12/2022 1042) Pt will Transfer Bed to Chair/Chair to Bed: with min assist Goal: Pt Will Ambulate Outcome: Progressing Flowsheets (Taken 06/12/2022 1042) Pt will Ambulate:  25 feet  with minimal assist  with rolling walker Goal: Pt/caregiver will Perform Home Exercise Program Outcome: Progressing Flowsheets (Taken 06/12/2022 1042) Pt/caregiver will Perform Home Exercise Program:  For increased strengthening  For improved balance  Independently  10:43 AM, 06/12/22 Mearl Latin PT, DPT Physical Therapist at [facility]

## 2022-06-12 NOTE — Anesthesia Postprocedure Evaluation (Signed)
Anesthesia Post Note  Patient: Michelle Zavala  Procedure(s) Performed: PERCUTANEOUS FIXATION OF FEMORAL NECK (Left: Hip)  Patient location during evaluation: Phase II Anesthesia Type: Spinal Level of consciousness: awake Pain management: pain level controlled Vital Signs Assessment: post-procedure vital signs reviewed and stable Respiratory status: spontaneous breathing and respiratory function stable Cardiovascular status: blood pressure returned to baseline and stable Postop Assessment: no headache and no apparent nausea or vomiting Anesthetic complications: no Comments: Late entry   No notable events documented.   Last Vitals:  Vitals:   06/12/22 0610 06/12/22 0801  BP: (!) 153/78 (!) 160/84  Pulse: (!) 108 (!) 107  Resp:    Temp: 36.7 C   SpO2: 93%     Last Pain:  Vitals:   06/12/22 0801  TempSrc:   PainSc: [place]

## 2022-06-12 NOTE — Evaluation (Signed)
Physical Therapy Evaluation Patient Details Name: Michelle Zavala MRN: [id number] DOB: 1945-10-21 Today's Date: 06/12/2022  History of Present Illness  Michelle Zavala is a 76 y.o. female with medical history significant for  HTN, cardiomyopathy, COPD, PSVT.  Patient went to gastroenterologist today, was leaving the office when she stepped of the curb wrong and fell landing on her left hip. She reports she hit her head.  She denies dizziness.  No chest pain or difficulty breathing.  Denies frequent falls.    She had complained to her gastroenterologist about intermittent melena.  She denies abdominal pain.  No vomiting of blood no blood in stools.  Plan was for outpatient colonoscopy and upper endoscopy in the near future.   Clinical Impression  Patient limited for functional mobility as stated below secondary to LLE pain/weakness, fatigue and impaired standing balance. Patient doing well with bed mobility and demonstrates good sitting tolerance and balance at EOB. She requires RW, cueing, and assist to transfer to standing. Patient limited to several small steps at bedside with RW to chair and ends session seated in chair. Patient will benefit from continued physical therapy in hospital and recommended venue below to increase strength, balance, endurance for safe ADLs and gait.        Recommendations for follow up therapy are one component of a multi-disciplinary discharge planning process, led by the attending physician.  Recommendations may be updated based on patient status, additional functional criteria and insurance authorization.  Follow Up Recommendations Skilled nursing-short term rehab (<3 hours/day) Can patient physically be transported by private vehicle: No    Assistance Recommended at Discharge Intermittent Supervision/Assistance  Patient can return home with the following  A lot of help with walking and/or transfers;A lot of help with bathing/dressing/bathroom    Equipment  Recommendations None recommended by PT  Recommendations for Other Services       Functional Status Assessment Patient has had a recent decline in their functional status and demonstrates the ability to make significant improvements in function in a reasonable and predictable amount of time.     Precautions / Restrictions Precautions Precautions: Fall Restrictions Weight Bearing Restrictions: Yes LLE Weight Bearing: Weight bearing as tolerated      Mobility  Bed Mobility Overal bed mobility: Needs Assistance Bed Mobility: Supine to Sit     Supine to sit: Min assist, HOB elevated          Transfers Overall transfer level: Needs assistance Equipment used: Rolling walker (2 wheels) Transfers: Sit to/from Stand, Bed to chair/wheelchair/BSC Sit to Stand: Mod assist   Step pivot transfers: Min assist, Mod assist       General transfer comment: cueing for hand placement and RW use, labored, requires assist and RW to power up to standing    Ambulation/Gait Ambulation/Gait assistance: Min assist, Mod assist Gait Distance (Feet): 5 Feet Assistive device: Rolling walker (2 wheels) Gait Pattern/deviations: Antalgic Gait velocity: decreased     General Gait Details: small steps at bedside to ambulate to chair  Stairs            Wheelchair Mobility    Modified Rankin (Stroke Patients Only)       Balance Overall balance assessment: Needs assistance Sitting-balance support: Feet supported, No upper extremity supported Sitting balance-Leahy Scale: Good Sitting balance - Comments: seated EOB   Standing balance support: Bilateral upper extremity supported Standing balance-Leahy Scale: Fair Standing balance comment: with RW  Pertinent Vitals/Pain Pain Assessment Pain Assessment: 0-10 Pain Score: 4  Pain Location: L hip Pain Descriptors / Indicators: Grimacing, Guarding Pain Intervention(s): Limited activity within  patient's tolerance, Monitored during session, Repositioned    Home Living Family/patient expects to be discharged to:: Private residence Living Arrangements: Alone   Type of Home: Apartment Home Access: Level entry       Home Layout: One level Home Equipment: Conservation officer, nature (2 wheels);Grab bars - tub/shower      Prior Function Prior Level of Function : Independent/Modified Independent             Mobility Comments: states community ambulation without AD ADLs Comments: independent     Hand Dominance        Extremity/Trunk Assessment   Upper Extremity Assessment Upper Extremity Assessment: Overall WFL for tasks assessed    Lower Extremity Assessment Lower Extremity Assessment: Generalized weakness;LLE deficits/detail LLE: Unable to fully assess due to pain    Cervical / Trunk Assessment Cervical / Trunk Assessment: Normal  Communication   Communication: No difficulties  Cognition Arousal/Alertness: Awake/alert Behavior During Therapy: WFL for tasks assessed/performed Overall Cognitive Status: Within Functional Limits for tasks assessed                                          General Comments      Exercises     Assessment/Plan    PT Assessment Patient needs continued PT services  PT Problem List Decreased strength;Decreased mobility;Decreased activity tolerance;Decreased balance       PT Treatment Interventions DME instruction;Therapeutic exercise;Gait training;Balance training;Stair training;Manual techniques;Neuromuscular re-education;Functional mobility training;Therapeutic activities;Patient/family education    PT Goals (Current goals can be found in the Care Plan section)  Acute Rehab PT Goals Patient Stated Goal: Return home PT Goal Formulation: With patient Time For Goal Achievement: 06/26/22 Potential to Achieve Goals: Good    Frequency Min 3X/week     Co-evaluation               AM-PAC PT "6 Clicks"  Mobility  Outcome Measure Help needed turning from your back to your side while in a flat bed without using bedrails?: A Little Help needed moving from lying on your back to sitting on the side of a flat bed without using bedrails?: A Little Help needed moving to and from a bed to a chair (including a wheelchair)?: A Lot Help needed standing up from a chair using your arms (e.g., wheelchair or bedside chair)?: A Lot Help needed to walk in hospital room?: A Lot Help needed climbing 3-5 steps with a railing? : A Lot 6 Click Score: 14    End of Session Equipment Utilized During Treatment: Gait belt Activity Tolerance: Patient tolerated treatment well;Patient limited by pain;Patient limited by fatigue Patient left: in chair;with call bell/phone within reach Nurse Communication: Mobility status PT Visit Diagnosis: Unsteadiness on feet (R26.81);Other abnormalities of gait and mobility (R26.89);Muscle weakness (generalized) (M62.81);History of falling (Z91.81)    Time: 2637-8588 PT Time Calculation (min) (ACUTE ONLY): 20 min   Charges:   PT Evaluation $PT Eval Low Complexity: 1 Low PT Treatments $Therapeutic Activity: 8-22 mins        10:41 AM, 06/12/22 Mearl Latin PT, DPT Physical Therapist at [facility]

## 2022-06-13 DIAGNOSIS — I429 Cardiomyopathy, unspecified: Secondary | ICD-10-CM | POA: Diagnosis not present

## 2022-06-13 DIAGNOSIS — J449 Chronic obstructive pulmonary disease, unspecified: Secondary | ICD-10-CM | POA: Diagnosis not present

## 2022-06-13 DIAGNOSIS — S72012D Unspecified intracapsular fracture of left femur, subsequent encounter for closed fracture with routine healing: Secondary | ICD-10-CM | POA: Diagnosis not present

## 2022-06-13 DIAGNOSIS — E876 Hypokalemia: Secondary | ICD-10-CM | POA: Diagnosis not present

## 2022-06-13 LAB — CBC
HCT: 33.4 % — ABNORMAL LOW (ref 36.0–46.0)
Hemoglobin: 11.2 g/dL — ABNORMAL LOW (ref 12.0–15.0)
MCH: 31.3 pg (ref 26.0–34.0)
MCHC: 33.5 g/dL (ref 30.0–36.0)
MCV: 93.3 fL (ref 80.0–100.0)
Platelets: 211 10*3/uL (ref 150–400)
RBC: 3.58 MIL/uL — ABNORMAL LOW (ref 3.87–5.11)
RDW: 12.6 % (ref 11.5–15.5)
WBC: 11.1 10*3/uL — ABNORMAL HIGH (ref 4.0–10.5)
nRBC: 0 % (ref 0.0–0.2)

## 2022-06-13 LAB — BASIC METABOLIC PANEL
Anion gap: 8 (ref 5–15)
BUN: 12 mg/dL (ref 8–23)
CO2: 27 mmol/L (ref 22–32)
Calcium: 8.3 mg/dL — ABNORMAL LOW (ref 8.9–10.3)
Chloride: 98 mmol/L (ref 98–111)
Creatinine, Ser: 0.78 mg/dL (ref 0.44–1.00)
GFR, Estimated: >60 mL/min (ref >60)
Glucose, Bld: 142 mg/dL — ABNORMAL HIGH (ref 70–99)
Potassium: 3.4 mmol/L — ABNORMAL LOW (ref 3.5–5.1)
Sodium: 133 mmol/L — ABNORMAL LOW (ref 135–145)

## 2022-06-13 LAB — MAGNESIUM: Magnesium: 2 mg/dL (ref 1.7–2.4)

## 2022-06-13 MED ORDER — ALUM & MAG HYDROXIDE-SIMETH 200-200-20 MG/5ML PO SUSP
15.0000 mL | ORAL | Status: DC | PRN
  Administered 2022-06-13: 15 mL via ORAL
  Filled 2022-06-13: qty 30

## 2022-06-13 MED ORDER — AMLODIPINE BESYLATE 5 MG PO TABS
5.0000 mg | ORAL_TABLET | Freq: Every day | ORAL | Status: DC
  Administered 2022-06-13 – 2022-06-14 (×2): 5 mg via ORAL
  Filled 2022-06-13 (×2): qty 1

## 2022-06-13 NOTE — Progress Notes (Signed)
PROGRESS NOTE   Michelle Zavala  XIP:[id number] DOB: March 31, 1946 DOA: 06/10/2022 PCP: Ralph Leyden, FNP   Chief Complaint  Patient presents with   Fall   Level of care: Med-Surg  Brief Admission History:  76 y.o. female with medical history significant for  HTN, cardiomyopathy, COPD, PSVT. Patient went to gastroenterologist today, was leaving the office when she stepped of the curb wrong and fell landing on her left hip. She reports she hit her head.  She denies dizziness.  No chest pain or difficulty breathing.  Denies frequent falls.   She had complained to her gastroenterologist about intermittent melena.  She denies abdominal pain.  No vomiting of blood no blood in stools.  Plan was for outpatient colonoscopy and upper endoscopy in the near future.   ED Course: Stable vitals. Hgb at baseline. Head and cervical Ct without acute abnormalities.  Pelvic CT- Acute nondisplaced left subcapital femoral neck fracture.  EDP talked to Dr. Aline Brochure- plans for surgery 9/29.    Assessment and Plan: * Closed subcapital fracture of left femur (HCC) Status post mechanical fall.  CT of the left hip without contrast-she has an acute nondisplaced left subcapital femoral neck fracture. - EDP talked to Dr. Aline Brochure, plans for surgery 9/23 - IV morphine 2 mg every 4 hourly as needed - Postop ORIF on 9/29: continue postop care per ortho - PT recommending SNF   Hypokalemia - IV replacement - Mg repleted  Hypomagnesemia - repleted  Prolonged QT interval QT 545.  -repleted magnesium, now up to 2.0 -s/p KCl -Holding Zoloft and amitriptyline for now  COPD (chronic obstructive pulmonary disease) ([place]) -Stable -DuoNebs as needed  Senile purpura ([place]) She has multiple purpura mostly involving upper extremity of difference sizes.  Patient reports over the past 2 months.  Per care everywhere- 05/13/22, she has had extensive hematology evaluation for this-which failed to show any evidence of hemorrhagic  disorder.  Per notes, she has senile purpura, and the omega-3 fatty acids and BC powder(which she was initially taking a lot of) worsened the bruising associated with her senile purpura.     Cardiomyopathy ([place]) Stable and compensated.  Last echo 08/2019 -EF of 35 to 40%.  With grade 1 diastolic dysfunction, regional wall motion abnormalities of left ventricle.  Subsequent cath 09/2021 shows mild to moderate nonobstructive coronary artery disease. -Resumed metoprolol and HCTZ  Essential hypertension, benign -Stable. -Resume metoprolol, HCTZ  DVT prophylaxis: SCDs Code Status: Full  Family Communication:  Disposition: Status is: Inpatient Remains inpatient appropriate because: intensity of illness   Consultants:  Orthopedics  Procedures:  ORIF 9/29 Dr. Aline Brochure  Antimicrobials:    Subjective: Pt starting to work with PT, agreeable to acute rehab placement   Objective: Vitals:   06/12/22 1335 06/12/22 2057 06/13/22 0518 06/13/22 1411  BP: 121/73 (!) 149/79 (!) 156/103 (!) 142/85  Pulse: 91 69 89 93  Resp:  20 17   Temp: 98.4 F (36.9 C) 97.8 F (36.6 C) 98.5 F (36.9 C) 97.7 F (36.5 C)  TempSrc: Oral Oral Oral Oral  SpO2: 98% 95% 100% 98%  Weight:      Height:        Intake/Output Summary (Last 24 hours) at 06/13/2022 1423 Last data filed at 06/13/2022 1252 Gross per 24 hour  Intake 720 ml  Output --  Net 720 ml   Filed Weights   06/10/22 0945 06/10/22 1938 06/11/22 1201  Weight: 77.6 kg 78.1 kg 78.1 kg   Examination:  General exam:  Appears calm and comfortable  Respiratory system: Clear to auscultation. Respiratory effort normal. Cardiovascular system: normal S1 & S2 heard. No JVD, murmurs, rubs, gallops or clicks. No pedal edema. Gastrointestinal system: Abdomen is nondistended, soft and nontender. No organomegaly or masses felt. Normal bowel sounds heard. Central nervous system: Alert and oriented. No focal neurological deficits. Extremities: bandages clean  and dry Skin: diffuse purpura lesions bilateral UEs Psychiatry: Judgement and insight appear normal. Mood & affect appropriate.   Data Reviewed: I have personally reviewed following labs and imaging studies  CBC: Recent Labs  Lab 06/10/22 0955 06/11/22 0325 06/13/22 0503  WBC 9.5 10.3 11.1*  NEUTROABS 6.7  --   --   HGB 12.0 12.0 11.2*  HCT 34.3* 35.4* 33.4*  MCV 91.5 92.4 93.3  PLT 240 248 973    Basic Metabolic Panel: Recent Labs  Lab 06/10/22 0955 06/10/22 1614 06/11/22 0325 06/13/22 0503  NA 134*  --  131* 133*  K 3.6  --  3.4* 3.4*  CL 99  --  98 98  CO2 26  --  23 27  GLUCOSE 101*  --  119* 142*  BUN 16  --  14 12  CREATININE 0.82  --  0.72 0.78  CALCIUM 8.8*  --  8.6* 8.3*  MG  --  1.6* 2.0 2.0    CBG: No results for input(s): "GLUCAP" in the last 168 hours.  Recent Results (from the past 240 hour(s))  Surgical PCR screen     Status: Abnormal   Collection Time: 06/11/22  3:23 AM   Specimen: Nasal Mucosa; Nasal Swab  Result Value Ref Range Status   MRSA, PCR POSITIVE (A) NEGATIVE Final    Comment: RESULT CALLED TO, READ BACK BY AND VERIFIED WITH: ROBERTSON B. AT [id number] ON [id number] BY THOMPSON S.    Staphylococcus aureus POSITIVE (A) NEGATIVE Final    Comment: RESULT CALLED TO, READ BACK BY AND VERIFIED WITH: ROBERTSON B. AT [id number] ON [id number] BY THOMPSON S. (NOTE) The Xpert SA Assay (FDA approved for NASAL specimens in patients 33 years of age and older), is one component of a comprehensive surveillance program. It is not intended to diagnose infection nor to guide or monitor treatment. Performed at [facility], [address], [place], [place] [address]      Radiology Studies: DG HIP UNILAT WITH PELVIS 2-3 VIEWS LEFT  Result Date: 06/11/2022 CLINICAL DATA:  A 76 year old female presents for evaluation of percutaneous pinning of the LEFT hip post LEFT hip fracture EXAM: DG HIP (WITH OR WITHOUT PELVIS) 2-3V LEFT COMPARISON:  June 10, 2022.  FINDINGS: Post placement of 3 screws across the LEFT femoral neck securing the previously identified subcapital fracture. Gas present in the soft tissues about the LEFT hip. No unexpected immediate postprocedure findings. IMPRESSION: Status post percutaneous ORIF of LEFT femoral neck fracture without unexpected immediate postprocedure findings. Electronically Signed   By: Zetta Bills M.D.   On: 06/11/2022 15:48    Scheduled Meds:  aspirin EC  325 mg Oral Q breakfast   atorvastatin  40 mg Oral Daily   docusate sodium  100 mg Oral BID   hydrochlorothiazide  25 mg Oral q morning   LORazepam  1 mg Oral BID   metoprolol succinate  50 mg Oral Daily   mupirocin ointment  1 Application Nasal BID   pantoprazole  40 mg Oral BID   potassium chloride  20 mEq Oral Daily   Continuous Infusions:  methocarbamol (ROBAXIN) IV  LOS: 3 days   Time spent: 35 mins  Yilia Sacca Wynetta Emery, MD How to contact the [facility] Attending or Consulting provider [place] or covering provider during after hours [place], for this patient?  Check the care team in [facility] and look for a) attending/consulting TRH provider listed and b) the [facility] team listed Log into [email or web address] and use [place]'s universal password to access. If you do not have the password, please contact the hospital operator. Locate the [facility] provider you are looking for under [facility] and page to a number that you can be directly reached. If you still have difficulty reaching the provider, please page the [facility] (Director on Call) for the Hospitalists listed on amion for assistance.  06/13/2022, 2:23 PM

## 2022-06-13 NOTE — TOC Progression Note (Signed)
Transition of Care [facility]) - Progression Note    Patient Details  Name: Michelle Zavala MRN: [id number] Date of Birth: 16-Jan-1946  Transition of Care [facility]) CM/SW Contact  Shade Flood, LCSW Phone Number: 06/13/2022, 1:47 PM  Clinical Narrative:     TOC following. Auth for SNF initiated today with [facility] facility pending. TOC will follow up tomorrow when bed offer available and accepted by pt. MD hoping for dc tomorrow.  Expected Discharge Plan: [place] Barriers to Discharge: Continued Medical Work up  Expected Discharge Plan and Services Expected Discharge Plan: [name]       Living arrangements for the past 2 months: Apartment                                       Social Determinants of Health (SDOH) Interventions    Readmission Risk Interventions    06/12/2022    1:18 PM  Readmission Risk Prevention Plan  Transportation Screening Complete  PCP or Specialist Appt within 5-7 Days Not Complete  Home Care Screening Complete  Medication Review (RN CM) Complete

## 2022-06-14 DIAGNOSIS — S72059A Unspecified fracture of head of unspecified femur, initial encounter for closed fracture: Secondary | ICD-10-CM | POA: Diagnosis not present

## 2022-06-14 DIAGNOSIS — I739 Peripheral vascular disease, unspecified: Secondary | ICD-10-CM | POA: Diagnosis not present

## 2022-06-14 DIAGNOSIS — J449 Chronic obstructive pulmonary disease, unspecified: Secondary | ICD-10-CM | POA: Diagnosis not present

## 2022-06-14 DIAGNOSIS — D539 Nutritional anemia, unspecified: Secondary | ICD-10-CM | POA: Diagnosis not present

## 2022-06-14 DIAGNOSIS — I10 Essential (primary) hypertension: Secondary | ICD-10-CM | POA: Diagnosis not present

## 2022-06-14 DIAGNOSIS — Z299 Encounter for prophylactic measures, unspecified: Secondary | ICD-10-CM | POA: Diagnosis not present

## 2022-06-14 DIAGNOSIS — F32A Depression, unspecified: Secondary | ICD-10-CM | POA: Diagnosis not present

## 2022-06-14 DIAGNOSIS — I429 Cardiomyopathy, unspecified: Secondary | ICD-10-CM | POA: Diagnosis not present

## 2022-06-14 DIAGNOSIS — R2689 Other abnormalities of gait and mobility: Secondary | ICD-10-CM | POA: Diagnosis not present

## 2022-06-14 DIAGNOSIS — I471 Supraventricular tachycardia, unspecified: Secondary | ICD-10-CM | POA: Diagnosis not present

## 2022-06-14 DIAGNOSIS — E876 Hypokalemia: Secondary | ICD-10-CM | POA: Diagnosis not present

## 2022-06-14 DIAGNOSIS — I5020 Unspecified systolic (congestive) heart failure: Secondary | ICD-10-CM | POA: Diagnosis not present

## 2022-06-14 DIAGNOSIS — R9431 Abnormal electrocardiogram [ECG] [EKG]: Secondary | ICD-10-CM | POA: Diagnosis not present

## 2022-06-14 DIAGNOSIS — M6281 Muscle weakness (generalized): Secondary | ICD-10-CM | POA: Diagnosis not present

## 2022-06-14 DIAGNOSIS — I2510 Atherosclerotic heart disease of native coronary artery without angina pectoris: Secondary | ICD-10-CM | POA: Diagnosis not present

## 2022-06-14 DIAGNOSIS — R42 Dizziness and giddiness: Secondary | ICD-10-CM | POA: Diagnosis not present

## 2022-06-14 DIAGNOSIS — Z8249 Family history of ischemic heart disease and other diseases of the circulatory system: Secondary | ICD-10-CM | POA: Diagnosis not present

## 2022-06-14 DIAGNOSIS — Z9181 History of falling: Secondary | ICD-10-CM | POA: Diagnosis not present

## 2022-06-14 DIAGNOSIS — S72012D Unspecified intracapsular fracture of left femur, subsequent encounter for closed fracture with routine healing: Secondary | ICD-10-CM | POA: Diagnosis not present

## 2022-06-14 DIAGNOSIS — S72012A Unspecified intracapsular fracture of left femur, initial encounter for closed fracture: Secondary | ICD-10-CM | POA: Diagnosis not present

## 2022-06-14 DIAGNOSIS — D692 Other nonthrombocytopenic purpura: Secondary | ICD-10-CM | POA: Diagnosis not present

## 2022-06-14 LAB — BASIC METABOLIC PANEL
Anion gap: 10 (ref 5–15)
BUN: 15 mg/dL (ref 8–23)
CO2: 25 mmol/L (ref 22–32)
Calcium: 9.2 mg/dL (ref 8.9–10.3)
Chloride: 98 mmol/L (ref 98–111)
Creatinine, Ser: 0.66 mg/dL (ref 0.44–1.00)
GFR, Estimated: >60 mL/min (ref >60)
Glucose, Bld: 170 mg/dL — ABNORMAL HIGH (ref 70–99)
Potassium: 3.7 mmol/L (ref 3.5–5.1)
Sodium: 133 mmol/L — ABNORMAL LOW (ref 135–145)

## 2022-06-14 LAB — CBC
HCT: 39.5 % (ref 36.0–46.0)
Hemoglobin: 13.2 g/dL (ref 12.0–15.0)
MCH: 31.4 pg (ref 26.0–34.0)
MCHC: 33.4 g/dL (ref 30.0–36.0)
MCV: 93.8 fL (ref 80.0–100.0)
Platelets: 292 10*3/uL (ref 150–400)
RBC: 4.21 MIL/uL (ref 3.87–5.11)
RDW: 12.6 % (ref 11.5–15.5)
WBC: 12.6 10*3/uL — ABNORMAL HIGH (ref 4.0–10.5)
nRBC: 0 % (ref 0.0–0.2)

## 2022-06-14 MED ORDER — OXYCODONE HCL 5 MG PO TABS: 5.0000 mg | ORAL_TABLET | Freq: Four times a day (QID) | ORAL | 0 refills | Status: DC | PRN

## 2022-06-14 MED ORDER — DOCUSATE SODIUM 100 MG PO CAPS: 100.0000 mg | ORAL_CAPSULE | Freq: Two times a day (BID) | ORAL | 0 refills | Status: DC

## 2022-06-14 MED ORDER — METOPROLOL SUCCINATE ER 50 MG PO TB24: 50.0000 mg | ORAL_TABLET | Freq: Every day | ORAL | Status: DC

## 2022-06-14 MED ORDER — ASPIRIN 325 MG PO TBEC: 325.0000 mg | DELAYED_RELEASE_TABLET | Freq: Every day | ORAL | 0 refills | Status: DC

## 2022-06-14 MED ORDER — AMITRIPTYLINE HCL 25 MG PO TABS
75.0000 mg | ORAL_TABLET | Freq: Every day | ORAL | Status: DC
  Administered 2022-06-14: 75 mg via ORAL
  Filled 2022-06-14: qty 3

## 2022-06-14 MED ORDER — AMLODIPINE BESYLATE 5 MG PO TABS: 5.0000 mg | ORAL_TABLET | Freq: Every day | ORAL | 1 refills | Status: DC

## 2022-06-14 MED ORDER — OXYCODONE HCL 5 MG PO TABS: 5.0000 mg | ORAL_TABLET | Freq: Four times a day (QID) | ORAL | 0 refills | Status: AC | PRN

## 2022-06-14 NOTE — TOC Transition Note (Signed)
Transition of Care [facility]) - CM/SW Discharge Note   Patient Details  Name: Michelle Zavala MRN: [id number] Date of Birth: 17-May-1946  Transition of Care [facility]) CM/SW Contact:  Salome Arnt, LCSW Phone Number: 06/14/2022, 1:00 PM   Clinical Narrative: Pt d/c today. Discussed d/c plans with pt and she confirms she wants to go to SNF. Accepts bed at [facility]. Pt's granddaughter at bedside will transport. D/C summary sent to SNF. Authorization received. Will give RN number to call report.       Final next level of care: Skilled Nursing Facility Barriers to Discharge: Barriers Resolved   Patient Goals and CMS Choice Patient states their goals for this hospitalization and ongoing recovery are:: agreeable to SNF CMS Medicare.gov Compare Post Acute Care list provided to:: Patient Choice offered to / list presented to : Patient  Discharge Placement              Patient chooses bed at: Other - please specify in the comment section below: ([facility]) Patient to be transferred to facility by: family Name of family member notified: granddaughter Patient and family notified of of transfer: 06/14/22  Discharge Plan and Services                                     Social Determinants of Health (SDOH) Interventions     Readmission Risk Interventions    06/12/2022    1:18 PM  Readmission Risk Prevention Plan  Transportation Screening Complete  PCP or Specialist Appt within 5-7 Days Not Complete  Home Care Screening Complete  Medication Review (RN CM) Complete

## 2022-06-14 NOTE — Progress Notes (Signed)
Physical Therapy Treatment Patient Details Name: Michelle Zavala MRN: [id number] DOB: 1946/05/13 Today's Date: 06/14/2022   History of Present Illness Michelle Zavala is a 76 y.o. female with medical history significant for  HTN, cardiomyopathy, COPD, PSVT.  Patient went to gastroenterologist today, was leaving the office when she stepped of the curb wrong and fell landing on her left hip. She reports she hit her head.  She denies dizziness.  No chest pain or difficulty breathing.  Denies frequent falls.    She had complained to her gastroenterologist about intermittent melena.  She denies abdominal pain.  No vomiting of blood no blood in stools.  Plan was for outpatient colonoscopy and upper endoscopy in the near future.    PT Comments    Patient demonstrates good return for sitting up at bedside, transferring to chair and walking in room and hallway without loss of balance with only minor increase in left hip pain.  Patient tolerated sitting up in chair after therapy - nurse aware.  Patient will benefit from continued skilled physical therapy in hospital and recommended venue below to increase strength, balance, endurance for safe ADLs and gait.     Recommendations for follow up therapy are one component of a multi-disciplinary discharge planning process, led by the attending physician.  Recommendations may be updated based on patient status, additional functional criteria and insurance authorization.  Follow Up Recommendations  Home health PT Can patient physically be transported by private vehicle: Yes   Assistance Recommended at Discharge Set up Supervision/Assistance  Patient can return home with the following A little help with walking and/or transfers;A little help with bathing/dressing/bathroom;Assistance with cooking/housework;Help with stairs or ramp for entrance   Equipment Recommendations       Recommendations for Other Services       Precautions / Restrictions  Precautions Precautions: Fall Restrictions Weight Bearing Restrictions: Yes LLE Weight Bearing: Weight bearing as tolerated     Mobility  Bed Mobility Overal bed mobility: Needs Assistance Bed Mobility: Supine to Sit     Supine to sit: HOB elevated, Supervision     General bed mobility comments: labored movement with good return for moving LLE with HOB raised approximately 45 degrees    Transfers Overall transfer level: Needs assistance Equipment used: Rolling walker (2 wheels) Transfers: Sit to/from Stand, Bed to chair/wheelchair/BSC Sit to Stand: Supervision   Step pivot transfers: Supervision, Min guard       General transfer comment: increased time, labored movement    Ambulation/Gait Ambulation/Gait assistance: Supervision, Min guard Gait Distance (Feet): 30 Feet Assistive device: Rolling walker (2 wheels) Gait Pattern/deviations: Decreased step length - right, Decreased step length - left, Decreased stance time - left, Decreased stride length Gait velocity: decreased     General Gait Details: demonstrates increased endurance/distance for taking steps with fair/good return for left heel to toe stepping without loss of balance   Stairs             Wheelchair Mobility    Modified Rankin (Stroke Patients Only)       Balance Overall balance assessment: Needs assistance Sitting-balance support: Feet supported, No upper extremity supported Sitting balance-Leahy Scale: Good Sitting balance - Comments: seated EOB   Standing balance support: During functional activity Standing balance-Leahy Scale: Fair Standing balance comment: fair/good using RW                            Cognition Arousal/Alertness: Awake/alert Behavior During Therapy: [facility]  for tasks assessed/performed Overall Cognitive Status: Within Functional Limits for tasks assessed                                          Exercises      General Comments         Pertinent Vitals/Pain Pain Assessment Pain Assessment: Faces Faces Pain Scale: Hurts a little bit Pain Location: L hip Pain Descriptors / Indicators: Sore Pain Intervention(s): Limited activity within patient's tolerance, Monitored during session, Repositioned    Home Living                          Prior Function            PT Goals (current goals can now be found in the care plan section) Acute Rehab PT Goals Patient Stated Goal: Return home PT Goal Formulation: With patient Time For Goal Achievement: 06/26/22 Potential to Achieve Goals: Good Progress towards PT goals: Progressing toward goals    Frequency    Min 3X/week      PT Plan Discharge plan needs to be updated    Co-evaluation              AM-PAC PT "6 Clicks" Mobility   Outcome Measure  Help needed turning from your back to your side while in a flat bed without using bedrails?: None Help needed moving from lying on your back to sitting on the side of a flat bed without using bedrails?: A Little Help needed moving to and from a bed to a chair (including a wheelchair)?: A Little Help needed standing up from a chair using your arms (e.g., wheelchair or bedside chair)?: None Help needed to walk in hospital room?: A Little Help needed climbing 3-5 steps with a railing? : A Lot 6 Click Score: 19    End of Session   Activity Tolerance: Patient tolerated treatment well;Patient limited by fatigue Patient left: in chair;with call bell/phone within reach;with nursing/sitter in room Nurse Communication: Mobility status PT Visit Diagnosis: Unsteadiness on feet (R26.81);Other abnormalities of gait and mobility (R26.89);Muscle weakness (generalized) (M62.81);History of falling (Z91.81)     Time: 5027-7412 PT Time Calculation (min) (ACUTE ONLY): 13 min  Charges:  $Therapeutic Activity: 8-22 mins                     12:21 PM, 06/14/22 Lonell Grandchild, MPT Physical Therapist with  [facility] 336 [phone] office [phone] mobile phone

## 2022-06-14 NOTE — Care Management Important Message (Signed)
Important Message  Patient Details  Name: Michelle Zavala MRN: [id number] Date of Birth: 01-31-1946   Medicare Important Message Given:  Yes (reviewed letter verbally at [phone], no additional copy needed)     Tommy Medal 06/14/2022, 12:07 PM

## 2022-06-14 NOTE — Progress Notes (Signed)
Nsg Discharge Note  Admit Date:  06/10/2022 Discharge date: 06/14/2022   Michelle Zavala to be D/C'd Home per MD order.  AVS completed.  Copy for chart, and copy for patient signed, and dated. Patient/caregiver able to verbalize understanding.  Discharge Medication: Allergies as of 06/14/2022       Reactions   Sulfonamide Derivatives Hives        Medication List     STOP taking these medications    meloxicam 15 MG tablet Commonly known as: MOBIC   rosuvastatin 20 MG tablet Commonly known as: CRESTOR       TAKE these medications    acetaminophen 650 MG CR tablet Commonly known as: TYLENOL Take 1,300 mg by mouth every 8 (eight) hours as needed for pain.   albuterol 108 (90 Base) MCG/ACT inhaler Commonly known as: VENTOLIN HFA Inhale 2 puffs into the lungs every 4 (four) hours as needed for wheezing or shortness of breath.   amitriptyline 75 MG tablet Commonly known as: ELAVIL Take 75 mg by mouth at bedtime.   amLODipine 5 MG tablet Commonly known as: NORVASC Take 1 tablet (5 mg total) by mouth daily. Start taking on: June 15, 2022   aspirin EC 325 MG tablet Take 1 tablet (325 mg total) by mouth daily with breakfast. Start taking on: June 15, 2022   atorvastatin 40 MG tablet Commonly known as: LIPITOR Take 40 mg by mouth daily.   docusate sodium 100 MG capsule Commonly known as: COLACE Take 1 capsule (100 mg total) by mouth 2 (two) times daily.   hydrochlorothiazide 25 MG tablet Commonly known as: HYDRODIURIL Take 1 tablet (25 mg total) by mouth every morning.   ipratropium 0.02 % nebulizer solution Commonly known as: ATROVENT Take 0.5 mg by nebulization every 4 (four) hours as needed for wheezing or shortness of breath.   Linzess 145 MCG Caps capsule Generic drug: linaclotide Take 145 mcg by mouth daily.   LORazepam 1 MG tablet Commonly known as: ATIVAN Take 1 mg by mouth 2 (two) times daily.   Magnesium Oxide -Mg Supplement 500 MG Tabs Take  500 mg by mouth daily.   meclizine 25 MG tablet Commonly known as: ANTIVERT Take 25 mg by mouth 3 (three) times daily as needed for dizziness.   Melatonin 10 MG Tabs Take 2 tablets by mouth at bedtime.   metoprolol succinate 50 MG 24 hr tablet Commonly known as: TOPROL-XL Take 1 tablet (50 mg total) by mouth daily. Take with or immediately following a meal.   oxyCODONE 5 MG immediate release tablet Commonly known as: Oxy IR/ROXICODONE Take 1 tablet (5 mg total) by mouth every 6 (six) hours as needed for up to 3 days for severe pain.   pantoprazole 40 MG tablet Commonly known as: PROTONIX Take 1 tablet (40 mg total) by mouth 2 (two) times daily.   potassium chloride SA 20 MEQ tablet Commonly known as: KLOR-CON M Take 2 tablets (40 mEq total) by mouth daily.   PreserVision AREDS 2 Caps Take 2 Capfuls by mouth daily.   sertraline 100 MG tablet Commonly known as: ZOLOFT Take 100 mg by mouth daily.   Vitamin B-12 5000 MCG Tbdp Take 1 tablet by mouth daily.        Discharge Assessment: Vitals:   06/14/22 0519 06/14/22 0900  BP: (!) 142/98 (!) 141/85  Pulse: (!) 101 (!) 101  Resp: 14 17  Temp: 97.7 F (36.5 C) (!) 97.5 F (36.4 C)  SpO2: 99% 98%  Skin clean, dry and intact without evidence of skin break down, no evidence of skin tears noted. IV catheter discontinued intact. Site without signs and symptoms of complications - no redness or edema noted at insertion site, patient denies c/o pain - only slight tenderness at site.  Dressing with slight pressure applied.  D/c Instructions-Education: Discharge instructions given to patient/family with verbalized understanding. D/c education completed with patient/family including follow up instructions, medication list, d/c activities limitations if indicated, with other d/c instructions as indicated by MD - patient able to verbalize understanding, all questions fully answered. Patient instructed to return to ED, call 911, or  call MD for any changes in condition.  Patient escorted via [place], and D/C home via private auto.  Clovis Fredrickson, LPN 43/[id number]/4274 7:01 PM

## 2022-06-14 NOTE — Progress Notes (Signed)
Report given to Robin at  [facility]

## 2022-06-14 NOTE — Progress Notes (Signed)
Pt. Stated that she agreed to SNF placement for rehab but she wants to do home health instead. Stated that her sister will be able to assist with care as well.

## 2022-06-14 NOTE — Discharge Instructions (Addendum)
Weight bearing as tolerated  Xrays with Dr. Aline Brochure at 2 weeks, 6 weeks and 12 weeks   Staples out at 14 days with Dr. Aline Brochure   Continue aspirin 325 mg for 35 days     IMPORTANT INFORMATION: [place]  Follow with Primary care provider  Michelle Leyden, Michelle Zavala  and other consultants as instructed by your Hospitalist Physician  [place], WORSEN OR NEW PROBLEM DEVELOPS   Please note: You were cared for by a hospitalist during your hospital stay. Every effort will be made to forward records to your primary care provider.  You can request that your primary care provider send for your hospital records if they have not received them.  Once you are discharged, your primary care physician will handle any further medical issues. Please note that NO REFILLS for any discharge medications will be authorized once you are discharged, as it is imperative that you return to your primary care physician (or establish a relationship with a primary care physician if you do not have one) for your post hospital discharge needs so that they can reassess your need for medications and monitor your lab values.  Please get a complete blood count and chemistry panel checked by your Primary MD at your next visit, and again as instructed by your Primary MD.  Get Medicines reviewed and adjusted: Please take all your medications with you for your next visit with your Primary MD  Laboratory/radiological data: Please request your Primary MD to go over all hospital tests and procedure/radiological results at the follow up, please ask your primary care provider to get all Hospital records sent to his/her office.  In some cases, they will be blood work, cultures and biopsy results pending at the time of your discharge. Please request that your primary care provider follow up on these results.  If you are diabetic,  please bring your blood sugar readings with you to your follow up appointment with primary care.    Please call and make your follow up appointments as soon as possible.    Also Note the following: If you experience worsening of your admission symptoms, develop shortness of breath, life threatening emergency, suicidal or homicidal thoughts you must seek medical attention immediately by calling 911 or calling your MD immediately  if symptoms less severe.  You must read complete instructions/literature along with all the possible adverse reactions/side effects for all the Medicines you take and that have been prescribed to you. Take any new Medicines after you have completely understood and accpet all the possible adverse reactions/side effects.   Do not drive when taking Pain medications or sleeping medications (Benzodiazepines)  Do not take more than prescribed Pain, Sleep and Anxiety Medications. It is not advisable to combine anxiety,sleep and pain medications without talking with your primary care practitioner  Special Instructions: If you have smoked or chewed Tobacco  in the last 2 yrs please stop smoking, stop any regular Alcohol  and or any Recreational drug use.  Wear Seat belts while driving.  Do not drive if taking any narcotic, mind altering or controlled substances or recreational drugs or alcohol.

## 2022-06-14 NOTE — Discharge Summary (Addendum)
Physician Discharge Summary  Michelle Zavala ZWC:[id number] DOB: [birth year] DOA: 06/10/2022  PCP: Ralph Leyden, FNP Orthopedics: Dr. Aline Brochure   Admit date: 06/10/2022 Discharge date: 06/14/2022  Admitted From:  Home  Disposition: SNF  Recommendations for Outpatient Follow-up:  Follow up with Dr Aline Brochure in 2 weeks for xrays and staple removal STAPLES NEED TO BE REMOVED IN 2 WEEKS  PT NEEDS XRAYS IN 2 WEEKS  ASPIRIN 325 MG DAILY FOR DVT PROPHYLAXIS FOR 33 MORE DAYS  Home Health:  PT   Discharge Condition: STABLE   CODE STATUS: FULL DIET: 2 GM LOW SODIUM    Brief Hospitalization Summary: Please see all hospital notes, images, labs for full details of the hospitalization. 76 y.o. female with medical history significant for  HTN, cardiomyopathy, COPD, PSVT. Patient went to gastroenterologist today, was leaving the office when she stepped of the curb wrong and fell landing on her left hip. She reports she hit her head.  She denies dizziness.  No chest pain or difficulty breathing.  Denies frequent falls.   She had complained to her gastroenterologist about intermittent melena.  She denies abdominal pain.  No vomiting of blood no blood in stools.  Plan was for outpatient colonoscopy and upper endoscopy in the near future.   ED Course: Stable vitals. Hgb at baseline. Head and cervical Ct without acute abnormalities.  Pelvic CT- Acute nondisplaced left subcapital femoral neck fracture.  EDP talked to Dr. Aline Brochure- plans for surgery 9/29.   HOSPITAL COURSE BY PROBLEM   Assessment and Plan: * Closed subcapital fracture of left femur ([place]) Status post mechanical fall.  CT of the left hip without contrast-she has an acute nondisplaced left subcapital femoral neck fracture. - EDP talked to Dr. Aline Brochure - IV morphine 2 mg every 4 hourly as needed - Postop ORIF on 9/29: continue postop care per ortho - PT recommending SNF - Pt declined - Pt will accept home health and live with sister - ortho  recs: WBAT, xrays at 2, 6, 12 weeks, ASA 325 mg x 35 days for DVT ppx, Staples out at 14 days.   - follow up with Dr. Aline Brochure in 2 weeks for Xrays and staple removal    Hypokalemia - IV replacement - Mg repleted  Hypomagnesemia - repleted  Prolonged QT interval QT 545.  -repleted magnesium, now up to 2.0 -s/p KCl   COPD (chronic obstructive pulmonary disease) ([place]) -Stable -DuoNebs as needed  Senile purpura ([place]) She has multiple purpura mostly involving upper extremity of difference sizes.  Patient reports over the past 2 months.  Per care everywhere- 05/13/22, she has had extensive hematology evaluation for this-which failed to show any evidence of hemorrhagic disorder.  Per notes, she has senile purpura, and the omega-3 fatty acids and BC powder(which she was initially taking a lot of) worsened the bruising associated with her senile purpura.     Cardiomyopathy ([place]) Stable and compensated.  Last echo 08/2019 -EF of 35 to 40%.  With grade 1 diastolic dysfunction, regional wall motion abnormalities of left ventricle.  Subsequent cath 09/2021 shows mild to moderate nonobstructive coronary artery disease. -Resumed metoprolol and HCTZ  Essential hypertension, benign -Stable. -Resume metoprolol, HCTZ, amlodipine   Discharge Diagnoses:  Principal Problem:   Closed subcapital fracture of left femur (HCC) Active Problems:   Essential hypertension, benign   Cardiomyopathy ([employer])   Senile purpura (HCC)   COPD (chronic obstructive pulmonary disease) (HCC)   Prolonged QT interval   Hypomagnesemia   Hypokalemia  Discharge Instructions:  Allergies as of 06/14/2022       Reactions   Sulfonamide Derivatives Hives        Medication List     STOP taking these medications    meloxicam 15 MG tablet Commonly known as: MOBIC   rosuvastatin 20 MG tablet Commonly known as: CRESTOR       TAKE these medications    acetaminophen 650 MG CR tablet Commonly known as:  TYLENOL Take 1,300 mg by mouth every 8 (eight) hours as needed for pain.   albuterol 108 (90 Base) MCG/ACT inhaler Commonly known as: VENTOLIN HFA Inhale 2 puffs into the lungs every 4 (four) hours as needed for wheezing or shortness of breath.   amitriptyline 75 MG tablet Commonly known as: ELAVIL Take 75 mg by mouth at bedtime.   amLODipine 5 MG tablet Commonly known as: NORVASC Take 1 tablet (5 mg total) by mouth daily. Start taking on: June 15, 2022   aspirin EC 325 MG tablet Take 1 tablet (325 mg total) by mouth daily with breakfast. Start taking on: June 15, 2022   atorvastatin 40 MG tablet Commonly known as: LIPITOR Take 40 mg by mouth daily.   docusate sodium 100 MG capsule Commonly known as: COLACE Take 1 capsule (100 mg total) by mouth 2 (two) times daily.   hydrochlorothiazide 25 MG tablet Commonly known as: HYDRODIURIL Take 1 tablet (25 mg total) by mouth every morning.   ipratropium 0.02 % nebulizer solution Commonly known as: ATROVENT Take 0.5 mg by nebulization every 4 (four) hours as needed for wheezing or shortness of breath.   Linzess 145 MCG Caps capsule Generic drug: linaclotide Take 145 mcg by mouth daily.   LORazepam 1 MG tablet Commonly known as: ATIVAN Take 1 mg by mouth 2 (two) times daily.   Magnesium Oxide -Mg Supplement 500 MG Tabs Take 500 mg by mouth daily.   meclizine 25 MG tablet Commonly known as: ANTIVERT Take 25 mg by mouth 3 (three) times daily as needed for dizziness.   Melatonin 10 MG Tabs Take 2 tablets by mouth at bedtime.   metoprolol succinate 50 MG 24 hr tablet Commonly known as: TOPROL-XL Take 1 tablet (50 mg total) by mouth daily. Take with or immediately following a meal.   oxyCODONE 5 MG immediate release tablet Commonly known as: Oxy IR/ROXICODONE Take 1 tablet (5 mg total) by mouth every 6 (six) hours as needed for up to 3 days for severe pain.   pantoprazole 40 MG tablet Commonly known as:  PROTONIX Take 1 tablet (40 mg total) by mouth 2 (two) times daily.   potassium chloride SA 20 MEQ tablet Commonly known as: KLOR-CON M Take 2 tablets (40 mEq total) by mouth daily.   PreserVision AREDS 2 Caps Take 2 Capfuls by mouth daily.   sertraline 100 MG tablet Commonly known as: ZOLOFT Take 100 mg by mouth daily.   Vitamin B-12 5000 MCG Tbdp Take 1 tablet by mouth daily.        Follow-up Information     Carole Civil, MD. Schedule an appointment as soon as possible for a visit in 2 week(s).   Specialties: Orthopedic Surgery, Radiology Why: FOLLOW UP FOR XRAYS Contact information: [address] [place] [address] [phone]                Allergies  Allergen Reactions   Sulfonamide Derivatives Hives   Allergies as of 06/14/2022       Reactions  Sulfonamide Derivatives Hives        Medication List     STOP taking these medications    meloxicam 15 MG tablet Commonly known as: MOBIC   rosuvastatin 20 MG tablet Commonly known as: CRESTOR       TAKE these medications    acetaminophen 650 MG CR tablet Commonly known as: TYLENOL Take 1,300 mg by mouth every 8 (eight) hours as needed for pain.   albuterol 108 (90 Base) MCG/ACT inhaler Commonly known as: VENTOLIN HFA Inhale 2 puffs into the lungs every 4 (four) hours as needed for wheezing or shortness of breath.   amitriptyline 75 MG tablet Commonly known as: ELAVIL Take 75 mg by mouth at bedtime.   amLODipine 5 MG tablet Commonly known as: NORVASC Take 1 tablet (5 mg total) by mouth daily. Start taking on: June 15, 2022   aspirin EC 325 MG tablet Take 1 tablet (325 mg total) by mouth daily with breakfast. Start taking on: June 15, 2022   atorvastatin 40 MG tablet Commonly known as: LIPITOR Take 40 mg by mouth daily.   docusate sodium 100 MG capsule Commonly known as: COLACE Take 1 capsule (100 mg total) by mouth 2 (two) times daily.    hydrochlorothiazide 25 MG tablet Commonly known as: HYDRODIURIL Take 1 tablet (25 mg total) by mouth every morning.   ipratropium 0.02 % nebulizer solution Commonly known as: ATROVENT Take 0.5 mg by nebulization every 4 (four) hours as needed for wheezing or shortness of breath.   Linzess 145 MCG Caps capsule Generic drug: linaclotide Take 145 mcg by mouth daily.   LORazepam 1 MG tablet Commonly known as: ATIVAN Take 1 mg by mouth 2 (two) times daily.   Magnesium Oxide -Mg Supplement 500 MG Tabs Take 500 mg by mouth daily.   meclizine 25 MG tablet Commonly known as: ANTIVERT Take 25 mg by mouth 3 (three) times daily as needed for dizziness.   Melatonin 10 MG Tabs Take 2 tablets by mouth at bedtime.   metoprolol succinate 50 MG 24 hr tablet Commonly known as: TOPROL-XL Take 1 tablet (50 mg total) by mouth daily. Take with or immediately following a meal.   oxyCODONE 5 MG immediate release tablet Commonly known as: Oxy IR/ROXICODONE Take 1 tablet (5 mg total) by mouth every 6 (six) hours as needed for up to 3 days for severe pain.   pantoprazole 40 MG tablet Commonly known as: PROTONIX Take 1 tablet (40 mg total) by mouth 2 (two) times daily.   potassium chloride SA 20 MEQ tablet Commonly known as: KLOR-CON M Take 2 tablets (40 mEq total) by mouth daily.   PreserVision AREDS 2 Caps Take 2 Capfuls by mouth daily.   sertraline 100 MG tablet Commonly known as: ZOLOFT Take 100 mg by mouth daily.   Vitamin B-12 5000 MCG Tbdp Take 1 tablet by mouth daily.        Procedures/Studies: DG HIP UNILAT WITH PELVIS 2-3 VIEWS LEFT  Result Date: 06/11/2022 CLINICAL DATA:  A 76 year old female presents for evaluation of percutaneous pinning of the LEFT hip post LEFT hip fracture EXAM: DG HIP (WITH OR WITHOUT PELVIS) 2-3V LEFT COMPARISON:  June 10, 2022. FINDINGS: Post placement of 3 screws across the LEFT femoral neck securing the previously identified subcapital  fracture. Gas present in the soft tissues about the LEFT hip. No unexpected immediate postprocedure findings. IMPRESSION: Status post percutaneous ORIF of LEFT femoral neck fracture without unexpected immediate postprocedure findings. Electronically Signed  By: Zetta Bills M.D.   On: 06/11/2022 15:48   DG HIP UNILAT WITH PELVIS 2-3 VIEWS LEFT  Result Date: 06/11/2022 CLINICAL DATA:  Surgical fixation of the left subcapital femoral neck fracture. EXAM: DG HIP (WITH OR WITHOUT PELVIS) 2-3V LEFT COMPARISON:  Left hip radiographs 06/10/2022 FINDINGS: Intraoperative images demonstrate placement of 3 compression screws through the left femoral head and neck. Left hip is located based on these images. IMPRESSION: Surgical fixation of the left femoral neck fracture. Electronically Signed   By: Markus Daft M.D.   On: 06/11/2022 14:13   DG C-Arm 1-60 Min-No Report  Result Date: 06/11/2022 Fluoroscopy was utilized by the requesting physician.  No radiographic interpretation.   DG CHEST PORT 1 VIEW  Result Date: 06/10/2022 CLINICAL DATA:  Fall, hip fracture in a 76 year old female. EXAM: PORTABLE CHEST 1 VIEW COMPARISON:  June 09, 2022. FINDINGS: EKG leads project over the chest. Cardiomediastinal contours and hilar structures are stable with cardiac enlargement. Lungs are clear. No sign of effusion. No pneumothorax. On limited assessment there is no acute skeletal process. IMPRESSION: No active disease. Electronically Signed   By: Zetta Bills M.D.   On: 06/10/2022 17:00   CT Hip Left Wo Contrast  Result Date: 06/10/2022 CLINICAL DATA:  Status post fall, left hip pain EXAM: CT OF THE LEFT HIP WITHOUT CONTRAST TECHNIQUE: Multidetector CT imaging of the left hip was performed according to the standard protocol. Multiplanar CT image reconstructions were also generated. RADIATION DOSE REDUCTION: This exam was performed according to the departmental dose-optimization program which includes automated  exposure control, adjustment of the mA and/or kV according to patient size and/or use of iterative reconstruction technique. COMPARISON:  None Available. FINDINGS: Bones/Joint/Cartilage Acute nondisplaced left subcapital femoral neck fracture without displacement or angulation. No other acute fracture or dislocation. Normal alignment. No joint effusion. Ligaments Ligaments are suboptimally evaluated by CT. Muscles and Tendons Muscles are normal. No muscle atrophy. No intramuscular fluid collection or hematoma. Soft tissue No fluid collection or hematoma. No soft tissue mass. Diverticulosis without evidence of diverticulitis. Partially visualized distended bladder. IMPRESSION: Acute nondisplaced left subcapital femoral neck fracture. Electronically Signed   By: Kathreen Devoid M.D.   On: 06/10/2022 13:44   CT HEAD WO CONTRAST (5MM)  Result Date: 06/10/2022 CLINICAL DATA:  Fall. EXAM: CT HEAD WITHOUT CONTRAST CT CERVICAL SPINE WITHOUT CONTRAST TECHNIQUE: Multidetector CT imaging of the head and cervical spine was performed following the standard protocol without intravenous contrast. Multiplanar CT image reconstructions of the cervical spine were also generated. RADIATION DOSE REDUCTION: This exam was performed according to the departmental dose-optimization program which includes automated exposure control, adjustment of the mA and/or kV according to patient size and/or use of iterative reconstruction technique. COMPARISON:  April 03, 2022.  February 22, 2022. FINDINGS: CT HEAD FINDINGS Brain: Mild chronic ischemic white matter disease is noted. No mass effect or midline shift is noted. Ventricular size is within normal limits. There is no evidence of mass lesion, hemorrhage or acute infarction. Vascular: No hyperdense vessel or unexpected calcification. Skull: Normal. Negative for fracture or focal lesion. Sinuses/Orbits: No acute finding. Other: None. CT CERVICAL SPINE FINDINGS Alignment: Mild grade 1 anterolisthesis  of C2-3 is noted which is stable compared to prior exam. Skull base and vertebrae: No acute fracture. No primary bone lesion or focal pathologic process. Soft tissues and spinal canal: No prevertebral fluid or swelling. No visible canal hematoma. Disc levels: Status post surgical anterior fusion of C5-6 and C6-7. Severe  degenerative disc disease is also noted at C4-5. There is near fusion of C3-4 most likely due to degenerative change. Upper chest: Negative. Other: None. IMPRESSION: No acute intracranial abnormality seen. Stable postsurgical and degenerative changes are seen involving the cervical spine. No acute abnormality is noted. Electronically Signed   By: Marijo Conception M.D.   On: 06/10/2022 12:07   CT Cervical Spine Wo Contrast  Result Date: 06/10/2022 CLINICAL DATA:  Fall. EXAM: CT HEAD WITHOUT CONTRAST CT CERVICAL SPINE WITHOUT CONTRAST TECHNIQUE: Multidetector CT imaging of the head and cervical spine was performed following the standard protocol without intravenous contrast. Multiplanar CT image reconstructions of the cervical spine were also generated. RADIATION DOSE REDUCTION: This exam was performed according to the departmental dose-optimization program which includes automated exposure control, adjustment of the mA and/or kV according to patient size and/or use of iterative reconstruction technique. COMPARISON:  April 03, 2022.  February 22, 2022. FINDINGS: CT HEAD FINDINGS Brain: Mild chronic ischemic white matter disease is noted. No mass effect or midline shift is noted. Ventricular size is within normal limits. There is no evidence of mass lesion, hemorrhage or acute infarction. Vascular: No hyperdense vessel or unexpected calcification. Skull: Normal. Negative for fracture or focal lesion. Sinuses/Orbits: No acute finding. Other: None. CT CERVICAL SPINE FINDINGS Alignment: Mild grade 1 anterolisthesis of C2-3 is noted which is stable compared to prior exam. Skull base and vertebrae: No acute  fracture. No primary bone lesion or focal pathologic process. Soft tissues and spinal canal: No prevertebral fluid or swelling. No visible canal hematoma. Disc levels: Status post surgical anterior fusion of C5-6 and C6-7. Severe degenerative disc disease is also noted at C4-5. There is near fusion of C3-4 most likely due to degenerative change. Upper chest: Negative. Other: None. IMPRESSION: No acute intracranial abnormality seen. Stable postsurgical and degenerative changes are seen involving the cervical spine. No acute abnormality is noted. Electronically Signed   By: Marijo Conception M.D.   On: 06/10/2022 12:07   DG Hip Unilat W or Wo Pelvis 2-3 Views Left  Result Date: 06/10/2022 CLINICAL DATA:  Fall, left hip pain EXAM: DG HIP (WITH OR WITHOUT PELVIS) 2-3V LEFT COMPARISON:  None Available. FINDINGS: Subtle, angulated subcapital fracture of the left femoral neck. Osteopenia. No obvious displaced fracture or dislocation of the pelvis or proximal right femur seen in single frontal view. IMPRESSION: 1. Subtle, angulated subcapital fracture of the left femoral neck. 2. No obvious displaced fracture or dislocation of the pelvis or proximal right femur seen in single frontal view. 3. Osteopenia. Electronically Signed   By: Delanna Ahmadi M.D.   On: 06/10/2022 11:53   DG Forearm Left  Result Date: 06/10/2022 CLINICAL DATA:  Status post fall.  Complains of left forearm pain. EXAM: LEFT FOREARM - 2 VIEW COMPARISON:  None Available. FINDINGS: There is no evidence of fracture or other focal bone lesions. Soft tissues are unremarkable. IMPRESSION: Negative. Electronically Signed   By: Kerby Moors M.D.   On: 06/10/2022 11:49   XR HIP UNILAT W OR W/O PELVIS 2-3 VIEWS LEFT  Result Date: 05/28/2022 AP pelvis hip x-rays are obtained and reviewed this is negative for hip arthritis.  Femoral neck is normal no evidence of stress fracture. Impression: Hip x-rays negative for acute changes.  XR Lumbar Spine 2-3  Views  Result Date: 05/28/2022 AP lateral lumbar images demonstrate previous compression fractures T10, T11,T12.  Generalized demineralization of the lumbar spine. Impression thoracic compression fractures.    Subjective:  Pt says she has no pain now, she decided to go live with sister and decline SNF today.    Discharge Exam: Vitals:   06/14/22 0519 06/14/22 0900  BP: (!) 142/98 (!) 141/85  Pulse: (!) 101 (!) 101  Resp: 14 17  Temp: 97.7 F (36.5 C) (!) 97.5 F (36.4 C)  SpO2: 99% 98%   Vitals:   06/13/22 1411 06/13/22 2027 06/14/22 0519 06/14/22 0900  BP: (!) 142/85 (!) 165/102 (!) 142/98 (!) 141/85  Pulse: 93 100 (!) 101 (!) 101  Resp:  '19 14 17  '$ Temp: 97.7 F (36.5 C) 98 F (36.7 C) 97.7 F (36.5 C) (!) 97.5 F (36.4 C)  TempSrc: Oral Oral Oral Oral  SpO2: 98% 100% 99% 98%  Weight:      Height:       General exam: Appears calm and comfortable  Respiratory system: Clear to auscultation. Respiratory effort normal. Cardiovascular system: normal S1 & S2 heard. No JVD, murmurs, rubs, gallops or clicks. No pedal edema. Gastrointestinal system: Abdomen is nondistended, soft and nontender. No organomegaly or masses felt. Normal bowel sounds heard. Central nervous system: Alert and oriented. No focal neurological deficits. Extremities: bandages clean and dry Skin: diffuse purpura lesions bilateral UEs Psychiatry: Judgement and insight appear normal. Mood & affect appropriate.   The results of significant diagnostics from this hospitalization (including imaging, microbiology, ancillary and laboratory) are listed below for reference.     Microbiology: Recent Results (from the past 240 hour(s))  Surgical PCR screen     Status: Abnormal   Collection Time: 06/11/22  3:23 AM   Specimen: Nasal Mucosa; Nasal Swab  Result Value Ref Range Status   MRSA, PCR POSITIVE (A) NEGATIVE Final    Comment: RESULT CALLED TO, READ BACK BY AND VERIFIED WITH: ROBERTSON B. AT [id number] ON [id number]  BY THOMPSON S.    Staphylococcus aureus POSITIVE (A) NEGATIVE Final    Comment: RESULT CALLED TO, READ BACK BY AND VERIFIED WITH: ROBERTSON B. AT [id number] ON [id number] BY THOMPSON S. (NOTE) The Xpert SA Assay (FDA approved for NASAL specimens in patients 17 years of age and older), is one component of a comprehensive surveillance program. It is not intended to diagnose infection nor to guide or monitor treatment. Performed at [facility], [address] [place], [place] [address]      Labs: BNP (last 3 results) No results for input(s): "BNP" in the last 8760 hours. Basic Metabolic Panel: Recent Labs  Lab 06/10/22 0955 06/10/22 1614 06/11/22 0325 06/13/22 0503 06/14/22 0534  NA 134*  --  131* 133* 133*  K 3.6  --  3.4* 3.4* 3.7  CL 99  --  98 98 98  CO2 26  --  '23 27 25  '$ GLUCOSE 101*  --  119* 142* 170*  BUN 16  --  '14 12 15  '$ CREATININE 0.82  --  0.72 0.78 0.66  CALCIUM 8.8*  --  8.6* 8.3* 9.2  MG  --  1.6* 2.0 2.0  --    Liver Function Tests: Recent Labs  Lab 06/10/22 0955  AST 21  ALT 18  ALKPHOS 61  BILITOT 0.9  PROT 6.4*  ALBUMIN 3.5   No results for input(s): "LIPASE", "AMYLASE" in the last 168 hours. No results for input(s): "AMMONIA" in the last 168 hours. CBC: Recent Labs  Lab 06/10/22 0955 06/11/22 0325 06/13/22 0503 06/14/22 0534  WBC 9.5 10.3 11.1* 12.6*  NEUTROABS 6.7  --   --   --  HGB 12.0 12.0 11.2* 13.2  HCT 34.3* 35.4* 33.4* 39.5  MCV 91.5 92.4 93.3 93.8  PLT 240 248 211 292   Cardiac Enzymes: No results for input(s): "CKTOTAL", "CKMB", "CKMBINDEX", "TROPONINI" in the last 168 hours. BNP: Invalid input(s): "POCBNP" CBG: No results for input(s): "GLUCAP" in the last 168 hours. D-Dimer No results for input(s): "DDIMER" in the last 72 hours. Hgb A1c No results for input(s): "HGBA1C" in the last 72 hours. Lipid Profile No results for input(s): "CHOL", "HDL", "LDLCALC", "TRIG", "CHOLHDL", "LDLDIRECT" in the last 72 hours. Thyroid  function studies No results for input(s): "TSH", "T4TOTAL", "T3FREE", "THYROIDAB" in the last 72 hours.  Invalid input(s): "FREET3" Anemia work up No results for input(s): "VITAMINB12", "FOLATE", "FERRITIN", "TIBC", "IRON", "RETICCTPCT" in the last 72 hours. Urinalysis    Component Value Date/Time   COLORURINE YELLOW 06/23/2016 1701   APPEARANCEUR CLEAR 06/23/2016 1701   LABSPEC 1.010 06/23/2016 1701   PHURINE 5.5 06/23/2016 1701   GLUCOSEU NEGATIVE 06/23/2016 1701   HGBUR NEGATIVE 06/23/2016 1701   BILIRUBINUR NEGATIVE 06/23/2016 1701   KETONESUR NEGATIVE 06/23/2016 1701   PROTEINUR NEGATIVE 06/23/2016 1701   UROBILINOGEN 0.2 09/23/2013 1624   NITRITE NEGATIVE 06/23/2016 1701   LEUKOCYTESUR NEGATIVE 06/23/2016 1701   Sepsis Labs Recent Labs  Lab 06/10/22 0955 06/11/22 0325 06/13/22 0503 06/14/22 0534  WBC 9.5 10.3 11.1* 12.6*   Microbiology Recent Results (from the past 240 hour(s))  Surgical PCR screen     Status: Abnormal   Collection Time: 06/11/22  3:23 AM   Specimen: Nasal Mucosa; Nasal Swab  Result Value Ref Range Status   MRSA, PCR POSITIVE (A) NEGATIVE Final    Comment: RESULT CALLED TO, READ BACK BY AND VERIFIED WITH: ROBERTSON B. AT [id number] ON [id number] BY THOMPSON S.    Staphylococcus aureus POSITIVE (A) NEGATIVE Final    Comment: RESULT CALLED TO, READ BACK BY AND VERIFIED WITH: ROBERTSON B. AT [id number] ON [id number] BY THOMPSON S. (NOTE) The Xpert SA Assay (FDA approved for NASAL specimens in patients 96 years of age and older), is one component of a comprehensive surveillance program. It is not intended to diagnose infection nor to guide or monitor treatment. Performed at [facility], [address], [place], [place] [address]    Time coordinating discharge: 36 mins   SIGNED:  Irwin Brakeman, MD  [facility] 06/14/2022, 11:20 AM How to contact the [facility] Attending or Consulting provider [place] or covering provider during after hours [place], for  this patient?  Check the care team in [facility] and look for a) attending/consulting [facility] provider listed and b) the [facility] team listed Log into [email or web address] and use [place]'s universal password to access. If you do not have the password, please contact the hospital operator. Locate the [facility] provider you are looking for under [facility] and page to a number that you can be directly reached. If you still have difficulty reaching the provider, please page the [facility] (Director on Call) for the Hospitalists listed on amion for assistance.

## 2022-06-14 NOTE — Progress Notes (Signed)
Subjective: 3 Days Post-Op Procedure(s) (LRB): PERCUTANEOUS FIXATION OF FEMORAL NECK (Left) Patient reports pain as well controlled with mild  Objective: Vital signs in last 24 hours: Temp:  [97.7 F (36.5 C)-98 F (36.7 C)] 97.7 F (36.5 C) (10/02 0519) Pulse Rate:  [93-101] 101 (10/02 0519) Resp:  [14-19] 14 (10/02 0519) BP: (142-165)/(85-102) 142/98 (10/02 0519) SpO2:  [98 %-100 %] 99 % (10/02 0519)  Intake/Output from previous day: 10/01 0701 - 10/02 0700 In: 720 [P.O.:720] Out: -  Intake/Output this shift: No intake/output data recorded.  Recent Labs    06/13/22 0503 06/14/22 0534  HGB 11.2* 13.2   Recent Labs    06/13/22 0503 06/14/22 0534  WBC 11.1* 12.6*  RBC 3.58* 4.21  HCT 33.4* 39.5  PLT 211 292   Recent Labs    06/13/22 0503 06/14/22 0534  NA 133* 133*  K 3.4* 3.7  CL 98 98  CO2 27 25  BUN 12 15  CREATININE 0.78 0.66  GLUCOSE 142* 170*  CALCIUM 8.3* 9.2   No results for input(s): "LABPT", "INR" in the last 72 hours.  Awake and alert.  Oriented.  Neurovascularly intact   Assessment/Plan: 3 Days Post-Op Procedure(s) (LRB): PERCUTANEOUS FIXATION OF FEMORAL NECK (Left) The patient has requested home discharge with home PT under the care of her sister.  I am in agreement with that.      Arther Abbott 06/14/2022, 7:34 AM

## 2022-06-15 DIAGNOSIS — Z299 Encounter for prophylactic measures, unspecified: Secondary | ICD-10-CM | POA: Diagnosis not present

## 2022-06-15 DIAGNOSIS — J449 Chronic obstructive pulmonary disease, unspecified: Secondary | ICD-10-CM | POA: Diagnosis not present

## 2022-06-15 DIAGNOSIS — S72059A Unspecified fracture of head of unspecified femur, initial encounter for closed fracture: Secondary | ICD-10-CM | POA: Diagnosis not present

## 2022-06-15 DIAGNOSIS — D692 Other nonthrombocytopenic purpura: Secondary | ICD-10-CM | POA: Diagnosis not present

## 2022-06-16 ENCOUNTER — Encounter: Payer: Self-pay | Admitting: Cardiology

## 2022-06-16 ENCOUNTER — Ambulatory Visit: Payer: Medicare Other | Attending: Cardiology | Admitting: Cardiology

## 2022-06-16 ENCOUNTER — Telehealth: Payer: Self-pay | Admitting: *Deleted

## 2022-06-16 VITALS — BP 124/82 | HR 103 | Ht 63.0 in | Wt 169.0 lb

## 2022-06-16 DIAGNOSIS — I5020 Unspecified systolic (congestive) heart failure: Secondary | ICD-10-CM

## 2022-06-16 DIAGNOSIS — I2510 Atherosclerotic heart disease of native coronary artery without angina pectoris: Secondary | ICD-10-CM

## 2022-06-16 DIAGNOSIS — I471 Supraventricular tachycardia, unspecified: Secondary | ICD-10-CM | POA: Diagnosis not present

## 2022-06-16 MED ORDER — METOPROLOL SUCCINATE ER 50 MG PO TB24: 75.0000 mg | ORAL_TABLET | Freq: Every day | ORAL | 3 refills | Status: DC

## 2022-06-16 MED ORDER — AMLODIPINE BESYLATE 2.5 MG PO TABS: 2.5000 mg | ORAL_TABLET | Freq: Every day | ORAL | 3 refills | Status: DC

## 2022-06-16 NOTE — Telephone Encounter (Signed)
Encounter form for TCS/EGD with Dr. Gala Romney ASA 3. Courtney please see recent hospital admission. Patient had fell when leaving her OV.

## 2022-06-16 NOTE — Patient Instructions (Addendum)
Medication Instructions:  Your physician has recommended you make the following change in your medication:  Decrease amlodipine (Norvasc) to 2.5 mg daily Increase metoprolol succinate (Toprol XL) to 75 mg daily Continue other medications the same  Labwork: none  Testing/Procedures: none  Follow-Up: Your physician recommends that you schedule a follow-up appointment in: 6 weeks  Any Other Special Instructions Will Be Listed Below (If Applicable).  If you need a refill on your cardiac medications before your next appointment, please call your pharmacy.

## 2022-06-16 NOTE — Progress Notes (Signed)
Cardiology Office Note  Date: 06/16/2022   ID: Michelle Zavala, DOB 08-11-1946, MRN [id number]  PCP:  Ralph Leyden, FNP  Cardiologist:  Rozann Lesches, MD Electrophysiologist:  None   Chief Complaint  Patient presents with   Cardiac follow-up    History of Present Illness: Michelle Zavala is a 76 y.o. female last seen in July.  She is here for a routine scheduled visit. Records indicate recent hospitalization status post mechanical fall with left subcapital femoral neck fracture.  She underwent operative repair, ORIF on September 29 with Dr. Aline Brochure.  Currently in the [facility] for rehabilitation.  I reviewed her medications which were adjusted during recent hospital stay.  Now on Norvasc which she had not been on at our last visit.  We discussed up titration of Toprol-XL and down titration of Norvasc for now. She previously did not tolerate Entresto due to lightheadedness.  Past Medical History:  Diagnosis Date   Anxiety    Arthritis    C2 cervical fracture ([place]) 09/17/2013   Traumatic fracture witth minimal displacement   Cardiomyopathy ([place])    a. EF 35-40% by echo in 08/2021   Chronic lung disease    Fibrosis - Dr. Koleen Nimrod   COPD (chronic obstructive pulmonary disease) ([place])    Coronary atherosclerosis of native coronary artery    a. Mild atherosclerosis by cath in 2010 b. NST in 07/2021 showing fixed defects but EF at 41% by NST and 35-40% by echo   Depression    Diverticulosis    Essential hypertension    GERD (gastroesophageal reflux disease)    PSVT (paroxysmal supraventricular tachycardia)     Past Surgical History:  Procedure Laterality Date   ABDOMINAL HYSTERECTOMY     ANTERIOR RELEASE VERTEBRAL BODY W/ POSTERIOR FUSION     BACTERIAL OVERGROWTH TEST N/A 02/19/2016   Procedure: BACTERIAL OVERGROWTH TEST;  Surgeon: Daneil Dolin, MD;  [facility];  Service: Endoscopy;  Laterality: N/A;  0700   BIOPSY N/A 08/04/2015    Procedure: BIOPSY;  Surgeon: Daneil Dolin, MD;  Location: [facility];  Service: Endoscopy;  Laterality: N/A;  Gastric   BIOPSY  02/21/2017   Procedure: BIOPSY;  Surgeon: Daneil Dolin, MD;  [facility];  Service: Endoscopy;;  ascending colon biopsy    BIOPSY  08/21/2020   Procedure: BIOPSY;  Surgeon: Daneil Dolin, MD;  [facility];  Service: Endoscopy;;   cataract surgery     [place]   COLONOSCOPY  June 2016   Dr. Britta Mccreedy: moderate diverticulosis in sigmoid colon, surveillance in 5 years    COLONOSCOPY WITH PROPOFOL N/A 02/21/2017   Dr. Gala Romney: diverticulosis in sigmoid colon, tubular adenomas, segmental biopsies benign. Surveillance in 5 years    ESOPHAGOGASTRODUODENOSCOPY (EGD) WITH PROPOFOL N/A 08/04/2015   Dr. Rourk:abnormal gastric mucosa s/p biopsy. Reactive gastropathy. Negative H.pylori   ESOPHAGOGASTRODUODENOSCOPY (EGD) WITH PROPOFOL N/A 02/21/2017   Dr. Gala Romney: empiric esophageal dilatation, small hiatal hernia, otherwise normal   ESOPHAGOGASTRODUODENOSCOPY (EGD) WITH PROPOFOL N/A 08/21/2020   normal esophagus s/p dilation. Erythematous mucosa in stomach of doubtful clinical significant s/p biopsy. Negative H.pylori. Mild reactive gastropathy.    INCISIONAL HERNIA REPAIR N/A 12/03/2015   Procedure: [name] HERNIORRHAPHY WITH MESH;  Surgeon: Aviva Signs, MD;  Location: [facility];  Service: General;  Laterality: N/A;   INSERTION OF MESH  12/03/2015   Procedure: INSERTION OF MESH;  Surgeon: Aviva Signs, MD;  Location: [facility];  Service: General;;   IR RADIOLOGIST EVAL & MGMT  09/22/2017   MALONEY DILATION N/A 02/21/2017   Procedure: [name] DILATION;  Surgeon: Daneil Dolin, MD;  [facility];  Service: Endoscopy;  Laterality: N/A;   MALONEY DILATION N/A 08/21/2020   Procedure: [name] DILATION;  Surgeon: Daneil Dolin, MD;  [facility];  Service: Endoscopy;  Laterality: N/A;   POLYPECTOMY  02/21/2017    Procedure: POLYPECTOMY;  Surgeon: Daneil Dolin, MD;  [facility];  Service: Endoscopy;;  hepatic flexure polyp cs   RIGHT/LEFT HEART CATH AND CORONARY ANGIOGRAPHY N/A 09/17/2021   Procedure: RIGHT/LEFT HEART CATH AND CORONARY ANGIOGRAPHY;  Surgeon: Nelva Bush, MD;  Location: [facility];  Service: Cardiovascular;  Laterality: N/A;   TMJ ARTHROSCOPY Bilateral 02/04/2015   Procedure: BILATERAL TEMPOROMANDIBULAR JOINT (TMJ) ARTHROSCOPY MENISECTOMY WITH FAT GRAFT FROM ABDOMEN ;  Surgeon: Diona Browner, DDS;  Location: [facility];  Service: Oral Surgery;  Laterality: Bilateral;   TOTAL KNEE ARTHROPLASTY Bilateral    TUBAL LIGATION      Current Outpatient Medications  Medication Sig Dispense Refill   acetaminophen (TYLENOL) 650 MG CR tablet Take 1,300 mg by mouth every 8 (eight) hours as needed for pain.      albuterol (VENTOLIN HFA) 108 (90 Base) MCG/ACT inhaler Inhale 2 puffs into the lungs every 4 (four) hours as needed for wheezing or shortness of breath.     amitriptyline (ELAVIL) 75 MG tablet Take 75 mg by mouth at bedtime.  6   amLODipine (NORVASC) 2.5 MG tablet Take 1 tablet (2.5 mg total) by mouth daily. 90 tablet 3   aspirin EC 325 MG tablet Take 325 mg by mouth daily.     atorvastatin (LIPITOR) 40 MG tablet Take 40 mg by mouth daily.     Cyanocobalamin (VITAMIN B-12) 5000 MCG TBDP Take 1 tablet by mouth daily.     docusate sodium (COLACE) 100 MG capsule Take 1 capsule (100 mg total) by mouth 2 (two) times daily. 10 capsule 0   hydrochlorothiazide (HYDRODIURIL) 25 MG tablet Take 1 tablet (25 mg total) by mouth every morning. 90 tablet 1   ipratropium (ATROVENT) 0.02 % nebulizer solution Take 0.5 mg by nebulization every 4 (four) hours as needed for wheezing or shortness of breath.     LINZESS 145 MCG CAPS capsule Take 145 mcg by mouth daily.     LORazepam (ATIVAN) 1 MG tablet Take 1 mg by mouth 2 (two) times daily.     Magnesium Oxide -Mg Supplement 500 MG TABS Take 500  mg by mouth daily.     meclizine (ANTIVERT) 25 MG tablet Take 25 mg by mouth 3 (three) times daily as needed for dizziness.     Melatonin 10 MG TABS Take 2 tablets by mouth at bedtime.     Multiple Vitamins-Minerals (PRESERVISION AREDS 2) CAPS Take 2 Capfuls by mouth daily.     omeprazole (PRILOSEC) 40 MG capsule Take 40 mg by mouth 2 (two) times daily.     oxyCODONE (OXY IR/ROXICODONE) 5 MG immediate release tablet Take 1 tablet (5 mg total) by mouth every 6 (six) hours as needed for up to 3 days for severe pain. 12 tablet 0   potassium chloride SA (KLOR-CON M) 20 MEQ tablet Take 2 tablets (40 mEq total) by mouth daily. 180 tablet 3   sertraline (ZOLOFT) 100 MG tablet Take 100 mg by mouth daily.     metoprolol succinate (  TOPROL-XL) 50 MG 24 hr tablet Take 1.5 tablets (75 mg total) by mouth daily. Take with or immediately following a meal. 135 tablet 3   No current facility-administered medications for this visit.   Allergies:  Sulfonamide derivatives   ROS: No palpitations.  Atypical left thoracic discomfort.  Physical Exam: VS:  BP 124/82   Pulse (!) 103   Ht '5\' 3"'$  (1.6 m)   Wt 169 lb (76.7 kg)   SpO2 97%   BMI 29.94 kg/m , BMI Body mass index is 29.94 kg/m.  Wt Readings from Last 3 Encounters:  06/16/22 169 lb (76.7 kg)  06/11/22 172 lb 2.9 oz (78.1 kg)  06/10/22 171 lb (77.6 kg)    General: Patient appears comfortable at rest. HEENT: Conjunctiva and lids normal. Neck: Supple, no elevated JVP or carotid bruits. Lungs: Clear to auscultation, nonlabored breathing at rest. Cardiac: Regular rate and rhythm, S3, no significant systolic murmur, no pericardial rub. Extremities: No pitting edema.  ECG:  An ECG dated 06/14/2022 was personally reviewed today and demonstrated:  Sinus rhythm with PACs, left anterior fascicular block, increased voltage.  Recent Labwork: 06/10/2022: ALT 18; AST 21 06/13/2022: Magnesium 2.0 06/14/2022: BUN 15; Creatinine, Ser 0.66; Hemoglobin 13.2;  Platelets 292; Potassium 3.7; Sodium 133     Component Value Date/Time   CHOL 223 (H) 09/23/2020 1454   TRIG 220 (H) 09/23/2020 1454   HDL 57 09/23/2020 1454   CHOLHDL 3.9 09/23/2020 1454   LDLCALC 129 (H) 09/23/2020 1454    Other Studies Reviewed Today:  Echocardiogram 08/17/2021:  1. Left ventricular ejection fraction, by estimation, is 35 to 40%. The  left ventricle has moderately decreased function. The left ventricle  demonstrates regional wall motion abnormalities (see scoring  diagram/findings for description). Left ventricular   diastolic parameters are consistent with Grade I diastolic dysfunction  (impaired relaxation). Abnormal global longitudinal strain of -9.8%.   2. Right ventricular systolic function is mildly reduced. The right  ventricular size is normal. There is normal pulmonary artery systolic  pressure. The estimated right ventricular systolic pressure is 47.4 mmHg.   3. Left atrial size was moderately dilated.   4. The mitral valve is abnormal. Trivial mitral valve regurgitation.   5. The aortic valve is tricuspid. Aortic valve regurgitation is mild to  moderate. Aortic regurgitation PHT measures 772 msec.   6. Aortic dilatation noted. There is moderate dilatation of the ascending  aorta, measuring 44 mm.   7. The inferior vena cava is normal in size with greater than 50%  respiratory variability, suggesting right atrial pressure of 3 mmHg.   Cardiac catheterization 09/17/2021: Conclusions: Mild-moderate, non-obstructive coronary artery disease, including 20% eccentric LMCA stenosis, sequential 40% mid and distal LAD stenoses, 30% OM1 and OM2 lesions, 60% LPL1 stenosis, and 30% proximal RCA stenosis. Mildly elevated left heart, right heart, and pulmonary artery pressures. Mildly reduced Fick cardiac output/index.   Recommendations: Escalate goal directed medical therapy for management of non-ischemic cardiomyopathy, as tolerated. Consider escalation of  diuresis. Medical therapy and risk factor modification to prevent progression of non-obstructive coronary artery disease.  Assessment and Plan:  1.  HFrEF with nonischemic cardiomyopathy, LVEF 35 to 40% by echocardiogram in December of last year.  GDMT has been limited by medication intolerances and also fluctuating vital signs.  Currently on Toprol-XL and HCTZ with potassium supplement.  Norvasc started during recent hospital stay for better control of hypertension.  She previously did not tolerate Entresto due to lightheadedness.  Plan to uptitrate Toprol-XL  to 75 mg daily and down titrate Norvasc to 2.5 mg daily for now.  2.  History of PSVT, quiescent.  3.  Nonobstructive coronary atherosclerosis.  She is now back on Lipitor.  Medication Adjustments/Labs and Tests Ordered: Current medicines are reviewed at length with the patient today.  Concerns regarding medicines are outlined above.   Tests Ordered: No orders of the defined types were placed in this encounter.   Medication Changes: Meds ordered this encounter  Medications   metoprolol succinate (TOPROL-XL) 50 MG 24 hr tablet    Sig: Take 1.5 tablets (75 mg total) by mouth daily. Take with or immediately following a meal.    Dispense:  135 tablet    Refill:  3    06/16/2022 dose increase   amLODipine (NORVASC) 2.5 MG tablet    Sig: Take 1 tablet (2.5 mg total) by mouth daily.    Dispense:  90 tablet    Refill:  3    06/16/2022 dose decrease    Disposition:  Follow up  6 weeks.  Signed, Satira Sark, MD, [facility] 06/16/2022 1:36 PM    [facility] [place], [place], [place] [address] Phone: [phone]; Fax: [phone]

## 2022-06-17 ENCOUNTER — Encounter (HOSPITAL_COMMUNITY): Payer: Self-pay | Admitting: Orthopedic Surgery

## 2022-06-17 NOTE — Telephone Encounter (Signed)
[place] to call back

## 2022-06-21 NOTE — Telephone Encounter (Signed)
[facility] to call back. Letter mailed. 

## 2022-06-24 DIAGNOSIS — Z299 Encounter for prophylactic measures, unspecified: Secondary | ICD-10-CM | POA: Diagnosis not present

## 2022-06-24 DIAGNOSIS — I10 Essential (primary) hypertension: Secondary | ICD-10-CM | POA: Diagnosis not present

## 2022-06-24 DIAGNOSIS — J449 Chronic obstructive pulmonary disease, unspecified: Secondary | ICD-10-CM | POA: Diagnosis not present

## 2022-06-24 DIAGNOSIS — I429 Cardiomyopathy, unspecified: Secondary | ICD-10-CM | POA: Diagnosis not present

## 2022-06-24 DIAGNOSIS — M6281 Muscle weakness (generalized): Secondary | ICD-10-CM | POA: Diagnosis not present

## 2022-06-24 DIAGNOSIS — S72012D Unspecified intracapsular fracture of left femur, subsequent encounter for closed fracture with routine healing: Secondary | ICD-10-CM | POA: Diagnosis not present

## 2022-06-24 DIAGNOSIS — R42 Dizziness and giddiness: Secondary | ICD-10-CM | POA: Diagnosis not present

## 2022-06-24 DIAGNOSIS — R2689 Other abnormalities of gait and mobility: Secondary | ICD-10-CM | POA: Diagnosis not present

## 2022-06-24 DIAGNOSIS — D692 Other nonthrombocytopenic purpura: Secondary | ICD-10-CM | POA: Diagnosis not present

## 2022-06-25 ENCOUNTER — Other Ambulatory Visit: Payer: Self-pay | Admitting: Cardiology

## 2022-06-28 ENCOUNTER — Ambulatory Visit (INDEPENDENT_AMBULATORY_CARE_PROVIDER_SITE_OTHER): Payer: Medicare Other

## 2022-06-28 ENCOUNTER — Ambulatory Visit (INDEPENDENT_AMBULATORY_CARE_PROVIDER_SITE_OTHER): Payer: Medicare Other | Admitting: Orthopedic Surgery

## 2022-06-28 ENCOUNTER — Encounter: Payer: Self-pay | Admitting: Orthopedic Surgery

## 2022-06-28 DIAGNOSIS — R233 Spontaneous ecchymoses: Secondary | ICD-10-CM

## 2022-06-28 DIAGNOSIS — S72002D Fracture of unspecified part of neck of left femur, subsequent encounter for closed fracture with routine healing: Secondary | ICD-10-CM

## 2022-06-28 MED ORDER — ACETAMINOPHEN-CODEINE 300-30 MG PO TABS: 1.0000 | ORAL_TABLET | Freq: Four times a day (QID) | ORAL | 0 refills | Status: AC | PRN

## 2022-06-28 NOTE — Progress Notes (Signed)
FOLLOW UP   Encounter Diagnosis  Name Primary?   Closed fracture of left hip with routine healing, subsequent encounter Yes     Chief Complaint  Patient presents with   Post-op Follow-up    Left hip 06/11/22 walking with walker, improving, staples removed without difficulty     76 year old female here for her first postop visit doing well walking with a walker.  She says she can walk independently.  Her wound was good the staples were taken out.  X-ray looks good.  Patient should weight-bear as tolerated follow-up with [place] in 5 to 6 weeks  Repeat x-ray at that time  Send patient to hematology to evaluate her frequent subcutaneous bleeding and bruising  Meds ordered this encounter  Medications   acetaminophen-codeine (TYLENOL #3) 300-30 MG tablet    Sig: Take 1 tablet by mouth every 6 (six) hours as needed for up to 5 days for moderate pain.    Dispense:  20 tablet    Refill:  0

## 2022-06-30 DIAGNOSIS — J449 Chronic obstructive pulmonary disease, unspecified: Secondary | ICD-10-CM | POA: Diagnosis not present

## 2022-06-30 DIAGNOSIS — Z299 Encounter for prophylactic measures, unspecified: Secondary | ICD-10-CM | POA: Diagnosis not present

## 2022-06-30 DIAGNOSIS — J019 Acute sinusitis, unspecified: Secondary | ICD-10-CM | POA: Diagnosis not present

## 2022-06-30 DIAGNOSIS — I10 Essential (primary) hypertension: Secondary | ICD-10-CM | POA: Diagnosis not present

## 2022-06-30 DIAGNOSIS — Z8781 Personal history of (healed) traumatic fracture: Secondary | ICD-10-CM | POA: Diagnosis not present

## 2022-06-30 DIAGNOSIS — Z2821 Immunization not carried out because of patient refusal: Secondary | ICD-10-CM | POA: Diagnosis not present

## 2022-07-09 DIAGNOSIS — Z9181 History of falling: Secondary | ICD-10-CM | POA: Diagnosis not present

## 2022-07-09 DIAGNOSIS — M545 Low back pain, unspecified: Secondary | ICD-10-CM | POA: Diagnosis not present

## 2022-07-09 DIAGNOSIS — J449 Chronic obstructive pulmonary disease, unspecified: Secondary | ICD-10-CM | POA: Diagnosis not present

## 2022-07-09 DIAGNOSIS — I10 Essential (primary) hypertension: Secondary | ICD-10-CM | POA: Diagnosis not present

## 2022-07-09 DIAGNOSIS — K219 Gastro-esophageal reflux disease without esophagitis: Secondary | ICD-10-CM | POA: Diagnosis not present

## 2022-07-09 DIAGNOSIS — Z7982 Long term (current) use of aspirin: Secondary | ICD-10-CM | POA: Diagnosis not present

## 2022-07-09 DIAGNOSIS — R413 Other amnesia: Secondary | ICD-10-CM | POA: Diagnosis not present

## 2022-07-09 DIAGNOSIS — I429 Cardiomyopathy, unspecified: Secondary | ICD-10-CM | POA: Diagnosis not present

## 2022-07-09 DIAGNOSIS — E559 Vitamin D deficiency, unspecified: Secondary | ICD-10-CM | POA: Diagnosis not present

## 2022-07-09 DIAGNOSIS — I471 Supraventricular tachycardia, unspecified: Secondary | ICD-10-CM | POA: Diagnosis not present

## 2022-07-09 DIAGNOSIS — F17210 Nicotine dependence, cigarettes, uncomplicated: Secondary | ICD-10-CM | POA: Diagnosis not present

## 2022-07-09 DIAGNOSIS — S72045D Nondisplaced fracture of base of neck of left femur, subsequent encounter for closed fracture with routine healing: Secondary | ICD-10-CM | POA: Diagnosis not present

## 2022-07-09 DIAGNOSIS — M1990 Unspecified osteoarthritis, unspecified site: Secondary | ICD-10-CM | POA: Diagnosis not present

## 2022-07-12 ENCOUNTER — Telehealth: Payer: Self-pay | Admitting: Orthopedic Surgery

## 2022-07-12 NOTE — Telephone Encounter (Signed)
Call from [facility] per [facility] for verbal orders - ph [phone]:    1 week x1  2 weeks x2  1 week x6

## 2022-07-13 NOTE — Telephone Encounter (Signed)
Called her with v o

## 2022-07-14 DIAGNOSIS — R2689 Other abnormalities of gait and mobility: Secondary | ICD-10-CM | POA: Diagnosis not present

## 2022-07-14 DIAGNOSIS — M6281 Muscle weakness (generalized): Secondary | ICD-10-CM | POA: Diagnosis not present

## 2022-07-14 DIAGNOSIS — Z9181 History of falling: Secondary | ICD-10-CM | POA: Diagnosis not present

## 2022-07-14 DIAGNOSIS — K219 Gastro-esophageal reflux disease without esophagitis: Secondary | ICD-10-CM | POA: Diagnosis not present

## 2022-07-14 DIAGNOSIS — Z7189 Other specified counseling: Secondary | ICD-10-CM | POA: Diagnosis not present

## 2022-07-14 DIAGNOSIS — R413 Other amnesia: Secondary | ICD-10-CM | POA: Diagnosis not present

## 2022-07-14 DIAGNOSIS — I10 Essential (primary) hypertension: Secondary | ICD-10-CM | POA: Diagnosis not present

## 2022-07-14 DIAGNOSIS — S72045D Nondisplaced fracture of base of neck of left femur, subsequent encounter for closed fracture with routine healing: Secondary | ICD-10-CM | POA: Diagnosis not present

## 2022-07-14 DIAGNOSIS — R42 Dizziness and giddiness: Secondary | ICD-10-CM | POA: Diagnosis not present

## 2022-07-14 DIAGNOSIS — E559 Vitamin D deficiency, unspecified: Secondary | ICD-10-CM | POA: Diagnosis not present

## 2022-07-14 DIAGNOSIS — I429 Cardiomyopathy, unspecified: Secondary | ICD-10-CM | POA: Diagnosis not present

## 2022-07-14 DIAGNOSIS — Z299 Encounter for prophylactic measures, unspecified: Secondary | ICD-10-CM | POA: Diagnosis not present

## 2022-07-14 DIAGNOSIS — I471 Supraventricular tachycardia, unspecified: Secondary | ICD-10-CM | POA: Diagnosis not present

## 2022-07-14 DIAGNOSIS — M1990 Unspecified osteoarthritis, unspecified site: Secondary | ICD-10-CM | POA: Diagnosis not present

## 2022-07-14 DIAGNOSIS — Z0000 Encounter for general adult medical examination without abnormal findings: Secondary | ICD-10-CM | POA: Diagnosis not present

## 2022-07-14 DIAGNOSIS — S72012D Unspecified intracapsular fracture of left femur, subsequent encounter for closed fracture with routine healing: Secondary | ICD-10-CM | POA: Diagnosis not present

## 2022-07-14 DIAGNOSIS — J449 Chronic obstructive pulmonary disease, unspecified: Secondary | ICD-10-CM | POA: Diagnosis not present

## 2022-07-14 DIAGNOSIS — R110 Nausea: Secondary | ICD-10-CM | POA: Diagnosis not present

## 2022-07-14 DIAGNOSIS — Z7982 Long term (current) use of aspirin: Secondary | ICD-10-CM | POA: Diagnosis not present

## 2022-07-14 DIAGNOSIS — F17210 Nicotine dependence, cigarettes, uncomplicated: Secondary | ICD-10-CM | POA: Diagnosis not present

## 2022-07-14 DIAGNOSIS — M545 Low back pain, unspecified: Secondary | ICD-10-CM | POA: Diagnosis not present

## 2022-07-16 DIAGNOSIS — S72045D Nondisplaced fracture of base of neck of left femur, subsequent encounter for closed fracture with routine healing: Secondary | ICD-10-CM | POA: Diagnosis not present

## 2022-07-16 DIAGNOSIS — F17210 Nicotine dependence, cigarettes, uncomplicated: Secondary | ICD-10-CM | POA: Diagnosis not present

## 2022-07-16 DIAGNOSIS — M1990 Unspecified osteoarthritis, unspecified site: Secondary | ICD-10-CM | POA: Diagnosis not present

## 2022-07-16 DIAGNOSIS — K219 Gastro-esophageal reflux disease without esophagitis: Secondary | ICD-10-CM | POA: Diagnosis not present

## 2022-07-16 DIAGNOSIS — E559 Vitamin D deficiency, unspecified: Secondary | ICD-10-CM | POA: Diagnosis not present

## 2022-07-16 DIAGNOSIS — R413 Other amnesia: Secondary | ICD-10-CM | POA: Diagnosis not present

## 2022-07-16 DIAGNOSIS — Z9181 History of falling: Secondary | ICD-10-CM | POA: Diagnosis not present

## 2022-07-16 DIAGNOSIS — J449 Chronic obstructive pulmonary disease, unspecified: Secondary | ICD-10-CM | POA: Diagnosis not present

## 2022-07-16 DIAGNOSIS — M545 Low back pain, unspecified: Secondary | ICD-10-CM | POA: Diagnosis not present

## 2022-07-16 DIAGNOSIS — I471 Supraventricular tachycardia, unspecified: Secondary | ICD-10-CM | POA: Diagnosis not present

## 2022-07-16 DIAGNOSIS — I429 Cardiomyopathy, unspecified: Secondary | ICD-10-CM | POA: Diagnosis not present

## 2022-07-16 DIAGNOSIS — Z7982 Long term (current) use of aspirin: Secondary | ICD-10-CM | POA: Diagnosis not present

## 2022-07-16 DIAGNOSIS — I10 Essential (primary) hypertension: Secondary | ICD-10-CM | POA: Diagnosis not present

## 2022-07-20 DIAGNOSIS — S72045D Nondisplaced fracture of base of neck of left femur, subsequent encounter for closed fracture with routine healing: Secondary | ICD-10-CM | POA: Diagnosis not present

## 2022-07-20 DIAGNOSIS — Z7982 Long term (current) use of aspirin: Secondary | ICD-10-CM | POA: Diagnosis not present

## 2022-07-20 DIAGNOSIS — M545 Low back pain, unspecified: Secondary | ICD-10-CM | POA: Diagnosis not present

## 2022-07-20 DIAGNOSIS — E559 Vitamin D deficiency, unspecified: Secondary | ICD-10-CM | POA: Diagnosis not present

## 2022-07-20 DIAGNOSIS — Z9181 History of falling: Secondary | ICD-10-CM | POA: Diagnosis not present

## 2022-07-20 DIAGNOSIS — I429 Cardiomyopathy, unspecified: Secondary | ICD-10-CM | POA: Diagnosis not present

## 2022-07-20 DIAGNOSIS — K219 Gastro-esophageal reflux disease without esophagitis: Secondary | ICD-10-CM | POA: Diagnosis not present

## 2022-07-20 DIAGNOSIS — I471 Supraventricular tachycardia, unspecified: Secondary | ICD-10-CM | POA: Diagnosis not present

## 2022-07-20 DIAGNOSIS — M1990 Unspecified osteoarthritis, unspecified site: Secondary | ICD-10-CM | POA: Diagnosis not present

## 2022-07-20 DIAGNOSIS — J449 Chronic obstructive pulmonary disease, unspecified: Secondary | ICD-10-CM | POA: Diagnosis not present

## 2022-07-20 DIAGNOSIS — F17210 Nicotine dependence, cigarettes, uncomplicated: Secondary | ICD-10-CM | POA: Diagnosis not present

## 2022-07-20 DIAGNOSIS — R413 Other amnesia: Secondary | ICD-10-CM | POA: Diagnosis not present

## 2022-07-20 DIAGNOSIS — I10 Essential (primary) hypertension: Secondary | ICD-10-CM | POA: Diagnosis not present

## 2022-07-22 DIAGNOSIS — I10 Essential (primary) hypertension: Secondary | ICD-10-CM | POA: Diagnosis not present

## 2022-07-22 DIAGNOSIS — M1990 Unspecified osteoarthritis, unspecified site: Secondary | ICD-10-CM | POA: Diagnosis not present

## 2022-07-22 DIAGNOSIS — E559 Vitamin D deficiency, unspecified: Secondary | ICD-10-CM | POA: Diagnosis not present

## 2022-07-22 DIAGNOSIS — M545 Low back pain, unspecified: Secondary | ICD-10-CM | POA: Diagnosis not present

## 2022-07-22 DIAGNOSIS — J449 Chronic obstructive pulmonary disease, unspecified: Secondary | ICD-10-CM | POA: Diagnosis not present

## 2022-07-22 DIAGNOSIS — F17210 Nicotine dependence, cigarettes, uncomplicated: Secondary | ICD-10-CM | POA: Diagnosis not present

## 2022-07-22 DIAGNOSIS — K219 Gastro-esophageal reflux disease without esophagitis: Secondary | ICD-10-CM | POA: Diagnosis not present

## 2022-07-22 DIAGNOSIS — R413 Other amnesia: Secondary | ICD-10-CM | POA: Diagnosis not present

## 2022-07-22 DIAGNOSIS — Z7982 Long term (current) use of aspirin: Secondary | ICD-10-CM | POA: Diagnosis not present

## 2022-07-22 DIAGNOSIS — S72045D Nondisplaced fracture of base of neck of left femur, subsequent encounter for closed fracture with routine healing: Secondary | ICD-10-CM | POA: Diagnosis not present

## 2022-07-22 DIAGNOSIS — Z9181 History of falling: Secondary | ICD-10-CM | POA: Diagnosis not present

## 2022-07-22 DIAGNOSIS — I471 Supraventricular tachycardia, unspecified: Secondary | ICD-10-CM | POA: Diagnosis not present

## 2022-07-22 DIAGNOSIS — I429 Cardiomyopathy, unspecified: Secondary | ICD-10-CM | POA: Diagnosis not present

## 2022-07-26 DIAGNOSIS — N390 Urinary tract infection, site not specified: Secondary | ICD-10-CM | POA: Diagnosis not present

## 2022-07-26 DIAGNOSIS — Z299 Encounter for prophylactic measures, unspecified: Secondary | ICD-10-CM | POA: Diagnosis not present

## 2022-07-26 DIAGNOSIS — R109 Unspecified abdominal pain: Secondary | ICD-10-CM | POA: Diagnosis not present

## 2022-07-26 DIAGNOSIS — M8580 Other specified disorders of bone density and structure, unspecified site: Secondary | ICD-10-CM | POA: Diagnosis not present

## 2022-07-27 ENCOUNTER — Encounter (HOSPITAL_COMMUNITY): Payer: Self-pay

## 2022-07-27 ENCOUNTER — Emergency Department (HOSPITAL_COMMUNITY): Payer: Medicare Other

## 2022-07-27 ENCOUNTER — Emergency Department (HOSPITAL_COMMUNITY)
Admission: EM | Admit: 2022-07-27 | Discharge: 2022-07-27 | Disposition: A | Discharge status: Home / Self Care | Payer: Medicare Other | Attending: Emergency Medicine | Admitting: Emergency Medicine

## 2022-07-27 ENCOUNTER — Other Ambulatory Visit: Payer: Self-pay

## 2022-07-27 DIAGNOSIS — S0990XA Unspecified injury of head, initial encounter: Secondary | ICD-10-CM | POA: Diagnosis not present

## 2022-07-27 DIAGNOSIS — Z79899 Other long term (current) drug therapy: Secondary | ICD-10-CM | POA: Diagnosis not present

## 2022-07-27 DIAGNOSIS — S4991XA Unspecified injury of right shoulder and upper arm, initial encounter: Secondary | ICD-10-CM | POA: Diagnosis not present

## 2022-07-27 DIAGNOSIS — Z7982 Long term (current) use of aspirin: Secondary | ICD-10-CM | POA: Diagnosis not present

## 2022-07-27 DIAGNOSIS — S0511XA Contusion of eyeball and orbital tissues, right eye, initial encounter: Secondary | ICD-10-CM | POA: Insufficient documentation

## 2022-07-27 DIAGNOSIS — W1830XA Fall on same level, unspecified, initial encounter: Secondary | ICD-10-CM | POA: Diagnosis not present

## 2022-07-27 DIAGNOSIS — S52501A Unspecified fracture of the lower end of right radius, initial encounter for closed fracture: Secondary | ICD-10-CM | POA: Diagnosis not present

## 2022-07-27 DIAGNOSIS — I10 Essential (primary) hypertension: Secondary | ICD-10-CM | POA: Diagnosis not present

## 2022-07-27 DIAGNOSIS — I2510 Atherosclerotic heart disease of native coronary artery without angina pectoris: Secondary | ICD-10-CM | POA: Diagnosis not present

## 2022-07-27 DIAGNOSIS — S161XXA Strain of muscle, fascia and tendon at neck level, initial encounter: Secondary | ICD-10-CM

## 2022-07-27 DIAGNOSIS — S52571A Other intraarticular fracture of lower end of right radius, initial encounter for closed fracture: Secondary | ICD-10-CM | POA: Diagnosis not present

## 2022-07-27 DIAGNOSIS — S022XXA Fracture of nasal bones, initial encounter for closed fracture: Secondary | ICD-10-CM | POA: Diagnosis not present

## 2022-07-27 DIAGNOSIS — M7989 Other specified soft tissue disorders: Secondary | ICD-10-CM | POA: Diagnosis not present

## 2022-07-27 DIAGNOSIS — Z043 Encounter for examination and observation following other accident: Secondary | ICD-10-CM | POA: Diagnosis not present

## 2022-07-27 DIAGNOSIS — S0083XA Contusion of other part of head, initial encounter: Secondary | ICD-10-CM

## 2022-07-27 DIAGNOSIS — S52601A Unspecified fracture of lower end of right ulna, initial encounter for closed fracture: Secondary | ICD-10-CM | POA: Diagnosis not present

## 2022-07-27 DIAGNOSIS — W19XXXA Unspecified fall, initial encounter: Secondary | ICD-10-CM

## 2022-07-27 DIAGNOSIS — S59901A Unspecified injury of right elbow, initial encounter: Secondary | ICD-10-CM | POA: Diagnosis present

## 2022-07-27 MED ORDER — BACITRACIN ZINC 500 UNIT/GM EX OINT
TOPICAL_OINTMENT | Freq: Once | CUTANEOUS | Status: AC
  Filled 2022-07-27: qty 1.8

## 2022-07-27 MED ORDER — HYDROCODONE-ACETAMINOPHEN 5-325 MG PO TABS: 1.0000 | ORAL_TABLET | Freq: Four times a day (QID) | ORAL | 0 refills | Status: DC | PRN

## 2022-07-27 MED ORDER — HYDROCODONE-ACETAMINOPHEN 5-325 MG PO TABS
2.0000 | ORAL_TABLET | Freq: Once | ORAL | Status: AC
  Administered 2022-07-27: 2 via ORAL
  Filled 2022-07-27: qty 2

## 2022-07-27 NOTE — Discharge Instructions (Signed)
Wear wrist splint until followed up by orthopedics.  The contact information for Dr. Aline Brochure has been provided in this discharge summary for you to call and make a follow-up appointment in the next 1 to 2 days.  Return to the emergency department for any new and/or concerning symptoms.

## 2022-07-27 NOTE — ED Triage Notes (Addendum)
Patient arrives POV from home c/o fall this morning around 2am. Pt was trying to get up to go to the restroom when she fell face down onto bed rail; right eye orbital is swollen and bruised with laceration to right maxillary side of face. Pt also has skin tear to right elbow and forearm, and to right knee. Pt denies LOC. Pt states she believes her right hand/wrist "may be broken."

## 2022-07-27 NOTE — ED Notes (Signed)
Patient's skin laceration to right facial cheek, skin tears to right arm, forearm, and right knee cleansed with normal saline, applied bacitracin, non-stick pad, and wrapped in kerlix (except for facial lac).

## 2022-07-27 NOTE — ED Provider Notes (Signed)
[facility] EMERGENCY DEPARTMENT Provider Note   CSN: [id number] Arrival date & time: 07/27/22  0302     History  Chief Complaint  Patient presents with   [name]    Michelle Zavala is a 76 y.o. female.  Patient is a 76 year old female with history of coronary artery disease, chronic renal insufficiency, hypertension, GERD.  Patient presenting today with complaints of a fall.  She got up in the night to go to the bathroom when she lost her balance and fell.  She has skin tears to the right elbow and bruising around her right eye.  She denies any loss of consciousness.  She needed assistance to stand, but was able to ambulate with a walker coming into the hospital.  Patient denies taking blood thinners.  The history is provided by the patient.       Home Medications Prior to Admission medications   Medication Sig Start Date End Date Taking? Authorizing Provider  acetaminophen (TYLENOL) 650 MG CR tablet Take 1,300 mg by mouth every 8 (eight) hours as needed for pain.     [provider]  albuterol (VENTOLIN HFA) 108 (90 Base) MCG/ACT inhaler Inhale 2 puffs into the lungs every 4 (four) hours as needed for wheezing or shortness of breath.    [provider]  amitriptyline (ELAVIL) 75 MG tablet Take 75 mg by mouth at bedtime. 01/27/15   [provider]  amLODipine (NORVASC) 5 MG tablet Take 5 mg by mouth daily. 06/25/22   [provider]  aspirin EC 325 MG tablet Take 325 mg by mouth daily.    [provider]  atorvastatin (LIPITOR) 40 MG tablet Take 40 mg by mouth daily. 04/22/22   [provider]  Cyanocobalamin (VITAMIN B-12) 5000 MCG TBDP Take 1 tablet by mouth daily.    [provider]  docusate sodium (COLACE) 100 MG capsule Take 1 capsule (100 mg total) by mouth 2 (two) times daily. 06/14/22   Johnson, Clanford L, MD  hydrochlorothiazide (HYDRODIURIL) 25 MG tablet Take 1 tablet (25 mg total) by mouth every morning. 03/15/22    Satira Sark, MD  ipratropium (ATROVENT) 0.02 % nebulizer solution Take 0.5 mg by nebulization every 4 (four) hours as needed for wheezing or shortness of breath.    [provider]  lamoTRIgine (LAMICTAL) 25 MG tablet Take 50 mg by mouth daily. 06/25/22   [provider]  levocetirizine (XYZAL) 5 MG tablet Take 5 mg by mouth at bedtime. 06/25/22   [provider]  LINZESS 145 MCG CAPS capsule Take 145 mcg by mouth daily. 06/09/22   [provider]  LORazepam (ATIVAN) 1 MG tablet Take 1 mg by mouth 2 (two) times daily.    [provider]  Magnesium Oxide -Mg Supplement 500 MG TABS Take 500 mg by mouth daily.    [provider]  meclizine (ANTIVERT) 25 MG tablet Take 25 mg by mouth 3 (three) times daily as needed for dizziness.    [provider]  Melatonin 10 MG TABS Take 2 tablets by mouth at bedtime.    [provider]  meloxicam (MOBIC) 15 MG tablet Take 15 mg by mouth daily as needed. 06/25/22   [provider]  metoprolol succinate (TOPROL-XL) 50 MG 24 hr tablet Take 1.5 tablets (75 mg total) by mouth daily. Take with or immediately following a meal. 06/16/22   Satira Sark, MD  Multiple Vitamins-Minerals (PRESERVISION AREDS 2) CAPS Take 2 Capfuls by mouth daily.  [provider]  omeprazole (PRILOSEC) 40 MG capsule Take 40 mg by mouth 2 (two) times daily.    [provider]  potassium chloride SA (KLOR-CON M) 20 MEQ tablet Take 2 tablets (40 mEq total) by mouth daily. 09/15/21   Strader, Fransisco Hertz, PA-C  sertraline (ZOLOFT) 100 MG tablet Take 100 mg by mouth daily.    [provider]      Allergies    Sulfonamide derivatives    Review of Systems   Review of Systems  All other systems reviewed and are negative.   Physical Exam Updated Vital Signs BP (!) 158/88 (BP Location: Left Arm)   Pulse 78   Temp 98.1 F (36.7 C) (Oral)   Resp 15   Ht '5\' 3"'$  (1.6 m)   Wt  75.8 kg   SpO2 99%   BMI 29.58 kg/m  Physical Exam Vitals and nursing note reviewed.  Constitutional:      General: She is not in acute distress.    Appearance: She is well-developed. She is not diaphoretic.  HENT:     Head: Normocephalic.  Eyes:     Comments: There is an ecchymosis noted and swelling around the right eye.  Pupils are equally round and reactive.  There is no evidence for orbital entrapment.  Cardiovascular:     Rate and Rhythm: Normal rate and regular rhythm.     Heart sounds: No murmur heard.    No friction rub. No gallop.  Pulmonary:     Effort: Pulmonary effort is normal. No respiratory distress.     Breath sounds: Normal breath sounds. No wheezing.  Abdominal:     General: Bowel sounds are normal. There is no distension.     Palpations: Abdomen is soft.     Tenderness: There is no abdominal tenderness.  Musculoskeletal:        General: Normal range of motion.     Cervical back: Normal range of motion and neck supple.     Comments: There are multiple skin tears noted about the right elbow.  She has good range of motion.    Swelling noted to the dorsal aspect of the right wrist.  Ulnar and radial pulses are palpable and motor and sensation are intact throughout the entire hand.  Skin:    General: Skin is warm and dry.  Neurological:     General: No focal deficit present.     Mental Status: She is alert and oriented to person, place, and time.     ED Results / Procedures / Treatments   Labs (all labs ordered are listed, but only abnormal results are displayed) Labs Reviewed - No data to display  EKG None  Radiology No results found.  Procedures Procedures    Medications Ordered in ED Medications - No data to display  ED Course/ Medical Decision Making/ A&P  Patient is a 76 year old female with past medical history as per HPI.  Patient presenting after a fall.  She got up this morning to go to the bathroom and lost her balance causing  multiple skin tears to her right elbow and a contusion/swelling around her right eye.  Patient arrives here with stable vital signs and is neurologically intact.  She is afebrile.  She has multiple superficial skin tears to the right elbow, none of which are in need of repair.  She also has abrasions to the right knee, but no significant discomfort.  Imaging studies initiated including CT scan of the head, maxillofacial  bones, and cervical spine.  These were all negative for fracture.  X-rays of the shoulder, elbow, and wrist also obtained showing an acute, closed intra-articular comminuted fracture of the distal radial metaphysis.  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  Patient will be placed in a wrist splint and advised to follow-up with orthopedic surgery in the next week.  Final Clinical Impression(s) / ED Diagnoses Final diagnoses:  None    Rx / DC Orders ED Discharge Orders     None         Veryl Speak, MD 07/27/22 [phone]

## 2022-07-28 ENCOUNTER — Encounter: Payer: Self-pay | Admitting: Orthopedic Surgery

## 2022-07-28 ENCOUNTER — Ambulatory Visit (INDEPENDENT_AMBULATORY_CARE_PROVIDER_SITE_OTHER): Payer: Medicare Other | Admitting: Orthopedic Surgery

## 2022-07-28 VITALS — BP 147/47 | HR 101 | Ht 63.0 in | Wt 167.0 lb

## 2022-07-28 DIAGNOSIS — Z299 Encounter for prophylactic measures, unspecified: Secondary | ICD-10-CM | POA: Diagnosis not present

## 2022-07-28 DIAGNOSIS — J449 Chronic obstructive pulmonary disease, unspecified: Secondary | ICD-10-CM | POA: Diagnosis not present

## 2022-07-28 DIAGNOSIS — S5291XA Unspecified fracture of right forearm, initial encounter for closed fracture: Secondary | ICD-10-CM | POA: Diagnosis not present

## 2022-07-28 DIAGNOSIS — I10 Essential (primary) hypertension: Secondary | ICD-10-CM | POA: Diagnosis not present

## 2022-07-28 DIAGNOSIS — S52201A Unspecified fracture of shaft of right ulna, initial encounter for closed fracture: Secondary | ICD-10-CM | POA: Diagnosis not present

## 2022-07-28 DIAGNOSIS — S52531A Colles' fracture of right radius, initial encounter for closed fracture: Secondary | ICD-10-CM | POA: Diagnosis not present

## 2022-07-28 NOTE — Progress Notes (Signed)
Chief Complaint  Patient presents with   Wrist Injury    DOI 07/26/22  fall   The patient is currently under treatment for left femoral neck fracture status post open treatment internal fixation with cannulated screws  In the postop 90-day global.  She sustained a fall and injured her right wrist  She comes in after falling sustaining a injury to the right wrist with multiple skin abrasions over the right elbow area of contusion over the right eye.  She simply got up fell backwards hit something in the house and landed on her hand  Review of systems no dizziness no blurred vision no numbness or tingling  Past Medical History:  Diagnosis Date   Anxiety    Arthritis    C2 cervical fracture ([place]) 09/17/2013   Traumatic fracture witth minimal displacement   Cardiomyopathy ([place])    a. EF 35-40% by echo in 08/2021   Chronic lung disease    Fibrosis - Dr. Koleen Nimrod   COPD (chronic obstructive pulmonary disease) ([place])    Coronary atherosclerosis of native coronary artery    a. Mild atherosclerosis by cath in 2010 b. NST in 07/2021 showing fixed defects but EF at 41% by NST and 35-40% by echo   Depression    Diverticulosis    Essential hypertension    GERD (gastroesophageal reflux disease)    PSVT (paroxysmal supraventricular tachycardia)     BP (!) 147/47   Pulse (!) 101   Ht '5\' 3"'$  (1.6 m)   Wt 167 lb (75.8 kg)   BMI 29.58 kg/m   Her right arm has multiple abrasions and skin tears.  She has tenderness bruising and swelling of the right hand and wrist.  She has decreased and painful range of motion of the right wrist and elbow.  She has an old fracture seen on x-ray of the ulna.  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   Closed Colles'  fracture of right radius, initial encounter Yes      Follow-up for x-rays in the cast

## 2022-07-29 ENCOUNTER — Encounter: Payer: Self-pay | Admitting: Student

## 2022-07-29 ENCOUNTER — Ambulatory Visit: Payer: Medicare Other | Attending: Student | Admitting: Student

## 2022-07-29 VITALS — BP 92/70 | HR 91 | Ht 63.0 in | Wt 166.6 lb

## 2022-07-29 DIAGNOSIS — I5020 Unspecified systolic (congestive) heart failure: Secondary | ICD-10-CM | POA: Diagnosis not present

## 2022-07-29 DIAGNOSIS — E785 Hyperlipidemia, unspecified: Secondary | ICD-10-CM

## 2022-07-29 DIAGNOSIS — I2510 Atherosclerotic heart disease of native coronary artery without angina pectoris: Secondary | ICD-10-CM

## 2022-07-29 DIAGNOSIS — I471 Supraventricular tachycardia, unspecified: Secondary | ICD-10-CM

## 2022-07-29 DIAGNOSIS — I7121 Aneurysm of the ascending aorta, without rupture: Secondary | ICD-10-CM

## 2022-07-29 DIAGNOSIS — I10 Essential (primary) hypertension: Secondary | ICD-10-CM | POA: Diagnosis not present

## 2022-07-29 MED ORDER — ASPIRIN 81 MG PO TBEC: 81.0000 mg | DELAYED_RELEASE_TABLET | Freq: Every day | ORAL | 3 refills | Status: DC

## 2022-07-29 NOTE — Patient Instructions (Addendum)
Medication Instructions:   STOP Amlodipine- call us if you find you are not taking it   START Aspirin 81 mg daily  Labwork: None today  Testing/Procedures: None today  Follow-Up: 6-8 weeks  Any Other Special Instructions Will Be Listed Below (If Applicable).  If you need a refill on your cardiac medications before your next appointment, please call your pharmacy.

## 2022-07-29 NOTE — Progress Notes (Signed)
Cardiology Office Note    Date:  07/29/2022   ID:  Michelle Zavala, DOB 03-05-1946, MRN [id number]  PCP:  Ralph Leyden, FNP  Cardiologist: Rozann Lesches, MD    Chief Complaint  Patient presents with   Follow-up    6 week visit    History of Present Illness:    Michelle Zavala is a 76 y.o. female with past medical history of CAD (mild plaque by catheterization in 2010, cath in 09/2021 showing mild to moderate nonobstructive CAD with medical management of NICM recommended), HFrEF (EF 35-40% in 08/2021 and 12/2021), pSVT, HTN, HLD, ascending thoracic aortic aneurysm (at 4.2 cm by CT in 06/2021), diverticulosis, GERD and COPD who presents to the office today for 6-week follow-up.   She was last examined by Dr. Domenic Polite in 06/2022 and had recently been hospitalized after suffering a mechanical fall and requiring ORIF. She had been started on Amlodipine during her hospitalization and this was reduced to 2.5 mg daily with Toprol-XL increased to 75 mg daily. She previously did not tolerate Entresto due to lightheadedness.  In talking with the patient and 2 family members today, she reports having a fall earlier this week due to losing her balance. ER notes reviewed and she did follow-up with Orthopedics yesterday and is being treated for a closed Colles' fracture of her right radius. She denies any associated dizziness or presyncope at that time and says she lost her balance. However, she does report intermittent dizziness and nausea throughout the day. Says her appetite has been minimal over the past few days. Her BP is soft at 92/70 during today's visit and she also reports hypotension when this was checked a few days ago as well with SBP in the 80's. She is unsure of her current medications but is listed as taking Amlodipine 5 mg daily, HCTZ 25 mg daily and Toprol-XL 75 mg daily. Says she has only been taking Toprol-XL 50 mg at times despite titration at her office visit last month. She is also  listed as being on Atorvastatin but says she was intolerant to this and Crestor due to myalgias. Also self-discontinued ASA in the interim as she was worried this would interfere with her BC powders. Reports her breathing has overall been stable with no specific orthopnea, PND or pitting edema. No recent chest pain or palpitations.   Past Medical History:  Diagnosis Date   Anxiety    Arthritis    C2 cervical fracture ([place]) 09/17/2013   Traumatic fracture witth minimal displacement   Cardiomyopathy ([place])    a. EF 35-40% by echo in 08/2021   Chronic lung disease    Fibrosis - Dr. Koleen Nimrod   COPD (chronic obstructive pulmonary disease) ([place])    Coronary atherosclerosis of native coronary artery    a. Mild atherosclerosis by cath in 2010 b. NST in 07/2021 showing fixed defects but EF at 41% by NST and 35-40% by echo   Depression    Diverticulosis    Essential hypertension    GERD (gastroesophageal reflux disease)    PSVT (paroxysmal supraventricular tachycardia)     Past Surgical History:  Procedure Laterality Date   ABDOMINAL HYSTERECTOMY     ANTERIOR RELEASE VERTEBRAL BODY W/ POSTERIOR FUSION     BACTERIAL OVERGROWTH TEST N/A 02/19/2016   Procedure: BACTERIAL OVERGROWTH TEST;  Surgeon: Daneil Dolin, MD;  [facility];  Service: Endoscopy;  Laterality: N/A;  0700   BIOPSY N/A 08/04/2015   Procedure: BIOPSY;  Surgeon: Herbie Baltimore  Hilton Cork, MD;  Location: [facility];  Service: Endoscopy;  Laterality: N/A;  Gastric   BIOPSY  02/21/2017   Procedure: BIOPSY;  Surgeon: Daneil Dolin, MD;  [facility];  Service: Endoscopy;;  ascending colon biopsy    BIOPSY  08/21/2020   Procedure: BIOPSY;  Surgeon: Daneil Dolin, MD;  [facility];  Service: Endoscopy;;   cataract surgery     [place]   COLONOSCOPY  June 2016   Dr. Britta Mccreedy: moderate diverticulosis in sigmoid colon, surveillance in 5 years    COLONOSCOPY WITH PROPOFOL N/A  02/21/2017   Dr. Gala Romney: diverticulosis in sigmoid colon, tubular adenomas, segmental biopsies benign. Surveillance in 5 years    ESOPHAGOGASTRODUODENOSCOPY (EGD) WITH PROPOFOL N/A 08/04/2015   Dr. Rourk:abnormal gastric mucosa s/p biopsy. Reactive gastropathy. Negative H.pylori   ESOPHAGOGASTRODUODENOSCOPY (EGD) WITH PROPOFOL N/A 02/21/2017   Dr. Gala Romney: empiric esophageal dilatation, small hiatal hernia, otherwise normal   ESOPHAGOGASTRODUODENOSCOPY (EGD) WITH PROPOFOL N/A 08/21/2020   normal esophagus s/p dilation. Erythematous mucosa in stomach of doubtful clinical significant s/p biopsy. Negative H.pylori. Mild reactive gastropathy.    HIP PINNING,CANNULATED Left 06/11/2022   Procedure: PERCUTANEOUS FIXATION OF FEMORAL NECK;  Surgeon: Carole Civil, MD;  [facility];  Service: Orthopedics;  Laterality: Left;   INCISIONAL HERNIA REPAIR N/A 12/03/2015   Procedure: [name] HERNIORRHAPHY WITH MESH;  Surgeon: Aviva Signs, MD;  [facility];  Service: General;  Laterality: N/A;   INSERTION OF MESH  12/03/2015   Procedure: INSERTION OF MESH;  Surgeon: Aviva Signs, MD;  [facility];  Service: General;;   IR RADIOLOGIST EVAL & MGMT  09/22/2017   MALONEY DILATION N/A 02/21/2017   Procedure: [name] DILATION;  Surgeon: Daneil Dolin, MD;  [facility];  Service: Endoscopy;  Laterality: N/A;   MALONEY DILATION N/A 08/21/2020   Procedure: [name] DILATION;  Surgeon: Daneil Dolin, MD;  [facility];  Service: Endoscopy;  Laterality: N/A;   POLYPECTOMY  02/21/2017   Procedure: POLYPECTOMY;  Surgeon: Daneil Dolin, MD;  [facility];  Service: Endoscopy;;  hepatic flexure polyp cs   RIGHT/LEFT HEART CATH AND CORONARY ANGIOGRAPHY N/A 09/17/2021   Procedure: RIGHT/LEFT HEART CATH AND CORONARY ANGIOGRAPHY;  Surgeon: Nelva Bush, MD;  Location: [facility];  Service: Cardiovascular;  Laterality: N/A;   TMJ ARTHROSCOPY Bilateral 02/04/2015    Procedure: BILATERAL TEMPOROMANDIBULAR JOINT (TMJ) ARTHROSCOPY MENISECTOMY WITH FAT GRAFT FROM ABDOMEN ;  Surgeon: Diona Browner, DDS;  Location: [facility];  Service: Oral Surgery;  Laterality: Bilateral;   TOTAL KNEE ARTHROPLASTY Bilateral    TUBAL LIGATION      Current Medications: Outpatient Medications Prior to Visit  Medication Sig Dispense Refill   acetaminophen (TYLENOL) 650 MG CR tablet Take 1,300 mg by mouth every 8 (eight) hours as needed for pain.      albuterol (VENTOLIN HFA) 108 (90 Base) MCG/ACT inhaler Inhale 2 puffs into the lungs every 4 (four) hours as needed for wheezing or shortness of breath.     amitriptyline (ELAVIL) 75 MG tablet Take 75 mg by mouth at bedtime.  6   Cyanocobalamin (VITAMIN B-12) 5000 MCG TBDP Take 1 tablet by mouth daily.     docusate sodium (COLACE) 100 MG capsule Take 1 capsule (100 mg total) by mouth 2 (two) times daily. 10 capsule 0   hydrochlorothiazide (HYDRODIURIL) 25 MG tablet Take 1 tablet (25 mg total)  by mouth every morning. 90 tablet 1   HYDROcodone-acetaminophen (NORCO) 5-325 MG tablet Take 1-2 tablets by mouth every 6 (six) hours as needed. 20 tablet 0   ipratropium (ATROVENT) 0.02 % nebulizer solution Take 0.5 mg by nebulization every 4 (four) hours as needed for wheezing or shortness of breath.     lamoTRIgine (LAMICTAL) 25 MG tablet Take 50 mg by mouth daily.     LORazepam (ATIVAN) 1 MG tablet Take 1 mg by mouth 2 (two) times daily.     Magnesium Oxide -Mg Supplement 500 MG TABS Take 500 mg by mouth daily.     meclizine (ANTIVERT) 25 MG tablet Take 25 mg by mouth 3 (three) times daily as needed for dizziness.     Melatonin 10 MG TABS Take 2 tablets by mouth at bedtime.     meloxicam (MOBIC) 15 MG tablet Take 15 mg by mouth daily as needed.     metoprolol succinate (TOPROL-XL) 50 MG 24 hr tablet Take 1.5 tablets (75 mg total) by mouth daily. Take with or immediately following a meal. 135 tablet 3   Multiple Vitamins-Minerals (PRESERVISION  AREDS 2) CAPS Take 2 Capfuls by mouth daily.     omeprazole (PRILOSEC) 40 MG capsule Take 40 mg by mouth 2 (two) times daily.     potassium chloride SA (KLOR-CON M) 20 MEQ tablet Take 2 tablets (40 mEq total) by mouth daily. 180 tablet 3   sertraline (ZOLOFT) 100 MG tablet Take 100 mg by mouth daily.     amLODipine (NORVASC) 5 MG tablet Take 5 mg by mouth daily.     aspirin EC 325 MG tablet Take 325 mg by mouth daily. (Patient not taking: Reported on 07/29/2022)     atorvastatin (LIPITOR) 40 MG tablet Take 40 mg by mouth daily. (Patient not taking: Reported on 07/29/2022)     levocetirizine (XYZAL) 5 MG tablet Take 5 mg by mouth at bedtime.     LINZESS 145 MCG CAPS capsule Take 145 mcg by mouth daily.     No facility-administered medications prior to visit.     Allergies:   Sulfonamide derivatives, Atorvastatin, Crestor [rosuvastatin], Entresto [sacubitril-valsartan], and [place] [dapagliflozin]   Social History   Socioeconomic History   Marital status: Widowed    Spouse name: Not on file   Number of children: 3   Years of education: 7   Highest education level: 7th grade  Occupational History   Occupation: Retired  Tobacco Use   Smoking status: Never    Passive exposure: Never   Smokeless tobacco: Never  Vaping Use   Vaping Use: Never used  Substance and Sexual Activity   Alcohol use: Not Currently    Comment: occasionally   Drug use: No   Sexual activity: Not Currently    Partners: Male    Birth control/protection: None, Surgical  Other Topics Concern   Not on file  Social History Narrative   Occasionally drinks [employer]eps[employer]    Social Determinants of Health   Financial Resource Strain: Low Risk  (04/22/2022)   Overall Financial Resource Strain (CARDIA)    Difficulty of Paying Living Expenses: Not hard at all  Food Insecurity: No Food Insecurity (04/22/2022)   Hunger Vital Sign    Worried About Running Out of Food in the Last Year: Never true    [place] in the Last  Year: Never true  Transportation Needs: No Transportation Needs (04/22/2022)   PRAPARE - Hydrologist (Medical):  No    Lack of Transportation (Non-Medical): No  Physical Activity: Inactive (04/22/2022)   Exercise Vital Sign    Days of Exercise per Week: 0 days    Minutes of Exercise per Session: 0 min  Stress: Stress Concern Present (04/22/2022)   [place]    Feeling of Stress : To some extent  Social Connections: Moderately Isolated (04/22/2022)   Social Connection and Isolation Panel [NHANES]    Frequency of Communication with Friends and Family: Three times a week    Frequency of Social Gatherings with Friends and Family: Three times a week    Attends Religious Services: More than 4 times per year    Active Member of Clubs or Organizations: No    Attends Archivist Meetings: Never    Marital Status: Widowed     Family History:  The patient's family history includes Colon cancer in her son; Coronary artery disease in her brother, father, and sister; Heart disease in her father.   Review of Systems:    Please see the history of present illness.     All other systems reviewed and are otherwise negative except as noted above.   Physical Exam:    VS:  BP 92/70   Pulse 91   Ht '5\' 3"'$  (1.6 m)   Wt 166 lb 9.6 oz (75.6 kg)   SpO2 96%   BMI 29.51 kg/m    General: Well developed, well nourished,female appearing in no acute distress. Ecchymosis along face.  Head: Normocephalic, atraumatic. Neck: No carotid bruits. JVD not elevated.  Lungs: Respirations regular and unlabored, without wheezes or rales.  Heart: Regular rate and rhythm. No S3 or S4.  No murmur, no rubs, or gallops appreciated. Abdomen: Appears non-distended. No obvious abdominal masses. Msk: Right arm in cast.  Extremities: No clubbing or cyanosis. No pitting edema.  Distal pedal pulses are 2+ bilaterally. Neuro:  Alert and oriented X 3. Moves all extremities spontaneously. No focal deficits noted. Psych:  Responds to questions appropriately with a normal affect. Skin: No rashes or lesions noted  Wt Readings from Last 3 Encounters:  07/29/22 166 lb 9.6 oz (75.6 kg)  07/28/22 167 lb (75.8 kg)  07/27/22 167 lb (75.8 kg)     Studies/Labs Reviewed:   EKG:  EKG is not ordered today.   Recent Labs: 06/10/2022: ALT 18 06/13/2022: Magnesium 2.0 06/14/2022: BUN 15; Creatinine, Ser 0.66; Hemoglobin 13.2; Platelets 292; Potassium 3.7; Sodium 133   Lipid Panel    Component Value Date/Time   CHOL 223 (H) 09/23/2020 1454   TRIG 220 (H) 09/23/2020 1454   HDL 57 09/23/2020 1454   CHOLHDL 3.9 09/23/2020 1454   LDLCALC 129 (H) 09/23/2020 1454    Additional studies/ records that were reviewed today include:   Echocardiogram: 08/2021 IMPRESSIONS     1. Left ventricular ejection fraction, by estimation, is 35 to 40%. The  left ventricle has moderately decreased function. The left ventricle  demonstrates regional wall motion abnormalities (see scoring  diagram/findings for description). Left ventricular   diastolic parameters are consistent with Grade I diastolic dysfunction  (impaired relaxation). Abnormal global longitudinal strain of -9.8%.   2. Right ventricular systolic function is mildly reduced. The right  ventricular size is normal. There is normal pulmonary artery systolic  pressure. The estimated right ventricular systolic pressure is 35.3 mmHg.   3. Left atrial size was moderately dilated.   4. The mitral valve is abnormal. Trivial mitral  valve regurgitation.   5. The aortic valve is tricuspid. Aortic valve regurgitation is mild to  moderate. Aortic regurgitation PHT measures 772 msec.   6. Aortic dilatation noted. There is moderate dilatation of the ascending  aorta, measuring 44 mm.   7. The inferior vena cava is normal in size with greater than 50%  respiratory variability, suggesting  right atrial pressure of 3 mmHg.   R/LHC: 09/2021 Conclusions: Mild-moderate, non-obstructive coronary artery disease, including 20% eccentric LMCA stenosis, sequential 40% mid and distal LAD stenoses, 30% OM1 and OM2 lesions, 60% LPL1 stenosis, and 30% proximal RCA stenosis. Mildly elevated left heart, right heart, and pulmonary artery pressures. Mildly reduced Fick cardiac output/index.   Recommendations: Escalate goal directed medical therapy for management of non-ischemic cardiomyopathy, as tolerated. Consider escalation of diuresis. Medical therapy and risk factor modification to prevent progression of non-obstructive coronary artery disease.   Echocardiogram: 12/2021 Summary   1. The left ventricle is mildly dilated in size with mildly increased wall  thickness.   2. The left ventricular systolic function is mildly to moderately decreased,  LVEF is visually estimated at 35-40%.    3. There is grade I diastolic dysfunction (impaired relaxation).    4. There is mild mitral valve regurgitation.    5. The aortic valve is trileaflet with mildly thickened leaflets with normal  excursion.   6. There is mild to moderate aortic regurgitation.    7. The left atrium is mildly to moderately dilated in size.    8. The right ventricle is normal in size, with normal systolic function.    9. IVC size and inspiratory change suggest mildly elevated right atrial  pressure. (5-10 mmHg).     Assessment:    1. HFrEF (heart failure with reduced ejection fraction) ([place])   2. Coronary artery disease involving native coronary artery of native heart without angina pectoris   3. PSVT (paroxysmal supraventricular tachycardia)   4. Essential hypertension, benign   5. Hyperlipidemia LDL goal <70   6. Aneurysm of ascending aorta without rupture ([facility])      Plan:   In order of problems listed above:  1. HFrEF/NICM - Her EF was at 35-40% by echo in 08/2021 with catheterization in 09/2021 showing  mild to moderate nonobstructive CAD. Medical therapy has been significantly limited secondary to intolerances as she previously had dizziness with Delene Loll and also reports having weakness with Wilder Glade in the past. She is currently on HCTZ 25 mg daily and Toprol-XL 75 mg daily.  Given her hypotension, I did stop Amlodipine today. Pending follow-up of her blood pressure, could try switching HCTZ to Spironolactone or trying low-dose Losartan.  Would certainly make one medication change at a time given her intolerances and overall hesitancy with medication changes.  2. CAD - She had nonobstructive disease by cath in 09/2021. No recent chest pain. She stopped ASA due to taking an occasional BC powder and she reports only taking these 1-2 times a month. I did recommend resuming ASA 81 mg daily. She did self discontinue statin therapy as outlined below and would readdress at follow-up visits.  3. SVT - She denies any recent palpitations and her heart rate is well-controlled in the 90's today.  Continue Toprol-XL 75 mg daily (taking 50-75 mg daily at a time as she felt that she did not need the additional dose and we reviewed this also helps with her SVT and cardiomyopathy).  4. HTN - Her BP is actually soft at 92/70 during today's visit  and she has been having hypotension earlier this week per her report. She is unsure of her current medications but is listed as taking Amlodipine 5 mg daily, HCTZ 25 mg daily and Toprol-XL 75 mg daily. I did recommend that she stop Amlodipine and report back with readings. If she is not taking Amlodipine, would plan to reduce HCTZ.  5. HLD - Her LDL was at 170 in 03/2022 and she had previously discontinued Atorvastatin and was started on Crestor '20mg'$  daily. She has self-discontinued Crestor as well. Not interested in alternatives at this time. I doubt she would ever be open to PCSK9 inhibitor therapy but could consider Zetia in the future.  6. Ascending Thoracic Aortic  Aneurysm  - This measured 4.2 cm by CT in 06/2021. Would review repeat imaging at her next visit.    Medication Adjustments/Labs and Tests Ordered: Current medicines are reviewed at length with the patient today.  Concerns regarding medicines are outlined above.  Medication changes, Labs and Tests ordered today are listed in the Patient Instructions below. Patient Instructions  Medication Instructions:   STOP Amlodipine- call us if you find you are not taking it   START Aspirin 81 mg daily  Labwork: None today  Testing/Procedures: None today  Follow-Up: 6-8 weeks  Any Other Special Instructions Will Be Listed Below (If Applicable).  If you need a refill on your cardiac medications before your next appointment, please call your pharmacy.    Signed, Erma Heritage, PA-C  07/29/2022 5:13 PM    [facility] [address] [place],  [address] Phone: [phone] Fax: [phone]

## 2022-07-30 DIAGNOSIS — M1990 Unspecified osteoarthritis, unspecified site: Secondary | ICD-10-CM | POA: Diagnosis not present

## 2022-07-30 DIAGNOSIS — S72045D Nondisplaced fracture of base of neck of left femur, subsequent encounter for closed fracture with routine healing: Secondary | ICD-10-CM | POA: Diagnosis not present

## 2022-07-30 DIAGNOSIS — Z7982 Long term (current) use of aspirin: Secondary | ICD-10-CM | POA: Diagnosis not present

## 2022-07-30 DIAGNOSIS — J449 Chronic obstructive pulmonary disease, unspecified: Secondary | ICD-10-CM | POA: Diagnosis not present

## 2022-07-30 DIAGNOSIS — I10 Essential (primary) hypertension: Secondary | ICD-10-CM | POA: Diagnosis not present

## 2022-07-30 DIAGNOSIS — R413 Other amnesia: Secondary | ICD-10-CM | POA: Diagnosis not present

## 2022-07-30 DIAGNOSIS — E559 Vitamin D deficiency, unspecified: Secondary | ICD-10-CM | POA: Diagnosis not present

## 2022-07-30 DIAGNOSIS — I471 Supraventricular tachycardia, unspecified: Secondary | ICD-10-CM | POA: Diagnosis not present

## 2022-07-30 DIAGNOSIS — M545 Low back pain, unspecified: Secondary | ICD-10-CM | POA: Diagnosis not present

## 2022-07-30 DIAGNOSIS — K219 Gastro-esophageal reflux disease without esophagitis: Secondary | ICD-10-CM | POA: Diagnosis not present

## 2022-07-30 DIAGNOSIS — F17210 Nicotine dependence, cigarettes, uncomplicated: Secondary | ICD-10-CM | POA: Diagnosis not present

## 2022-07-30 DIAGNOSIS — Z9181 History of falling: Secondary | ICD-10-CM | POA: Diagnosis not present

## 2022-07-30 DIAGNOSIS — I429 Cardiomyopathy, unspecified: Secondary | ICD-10-CM | POA: Diagnosis not present

## 2022-08-02 ENCOUNTER — Telehealth: Payer: Self-pay

## 2022-08-02 NOTE — Telephone Encounter (Signed)
        Patient  visited Dorita Fray on 11/14    Telephone encounter attempt :  1st  A HIPAA compliant voice message was left requesting a return call.  Instructed patient to call back     [place]  [phone] [address], [place], [place] [address] Phone: [phone] Email: [email or web address]

## 2022-08-03 ENCOUNTER — Telehealth: Payer: Self-pay | Admitting: Radiology

## 2022-08-03 DIAGNOSIS — H7291 Unspecified perforation of tympanic membrane, right ear: Secondary | ICD-10-CM | POA: Diagnosis not present

## 2022-08-03 DIAGNOSIS — M545 Low back pain, unspecified: Secondary | ICD-10-CM | POA: Diagnosis not present

## 2022-08-03 DIAGNOSIS — J449 Chronic obstructive pulmonary disease, unspecified: Secondary | ICD-10-CM | POA: Diagnosis not present

## 2022-08-03 DIAGNOSIS — Z7982 Long term (current) use of aspirin: Secondary | ICD-10-CM | POA: Diagnosis not present

## 2022-08-03 DIAGNOSIS — Z9181 History of falling: Secondary | ICD-10-CM | POA: Diagnosis not present

## 2022-08-03 DIAGNOSIS — R413 Other amnesia: Secondary | ICD-10-CM | POA: Diagnosis not present

## 2022-08-03 DIAGNOSIS — K219 Gastro-esophageal reflux disease without esophagitis: Secondary | ICD-10-CM | POA: Diagnosis not present

## 2022-08-03 DIAGNOSIS — I429 Cardiomyopathy, unspecified: Secondary | ICD-10-CM | POA: Diagnosis not present

## 2022-08-03 DIAGNOSIS — I10 Essential (primary) hypertension: Secondary | ICD-10-CM | POA: Diagnosis not present

## 2022-08-03 DIAGNOSIS — E559 Vitamin D deficiency, unspecified: Secondary | ICD-10-CM | POA: Diagnosis not present

## 2022-08-03 DIAGNOSIS — R42 Dizziness and giddiness: Secondary | ICD-10-CM | POA: Diagnosis not present

## 2022-08-03 DIAGNOSIS — S72045D Nondisplaced fracture of base of neck of left femur, subsequent encounter for closed fracture with routine healing: Secondary | ICD-10-CM | POA: Diagnosis not present

## 2022-08-03 DIAGNOSIS — F17210 Nicotine dependence, cigarettes, uncomplicated: Secondary | ICD-10-CM | POA: Diagnosis not present

## 2022-08-03 DIAGNOSIS — Z0000 Encounter for general adult medical examination without abnormal findings: Secondary | ICD-10-CM | POA: Diagnosis not present

## 2022-08-03 DIAGNOSIS — M1990 Unspecified osteoarthritis, unspecified site: Secondary | ICD-10-CM | POA: Diagnosis not present

## 2022-08-03 DIAGNOSIS — Z299 Encounter for prophylactic measures, unspecified: Secondary | ICD-10-CM | POA: Diagnosis not present

## 2022-08-03 DIAGNOSIS — R5383 Other fatigue: Secondary | ICD-10-CM | POA: Diagnosis not present

## 2022-08-03 DIAGNOSIS — I471 Supraventricular tachycardia, unspecified: Secondary | ICD-10-CM | POA: Diagnosis not present

## 2022-08-03 NOTE — Telephone Encounter (Signed)
Michelle Zavala called, LMVM requesting nursing orders for wound care, skin tear to right leg. 1w3, 2w2, 2prn.  Please call her at [phone].  I called back and advised orders ok.

## 2022-08-04 ENCOUNTER — Telehealth: Payer: Self-pay | Admitting: Orthopedic Surgery

## 2022-08-04 NOTE — Telephone Encounter (Signed)
Michelle Zavala w/[facility] [phone]) called requesting verbal approval to increase physical therapy frequency to two times a week for four weeks starting next week.  She wants to get clarification on weight bearing instructions as well.  Also she stated that the patient's pain level is an 8 out of 10 in her right shoulder.

## 2022-08-09 ENCOUNTER — Ambulatory Visit (INDEPENDENT_AMBULATORY_CARE_PROVIDER_SITE_OTHER): Payer: Medicare Other | Admitting: Orthopedic Surgery

## 2022-08-09 ENCOUNTER — Ambulatory Visit (INDEPENDENT_AMBULATORY_CARE_PROVIDER_SITE_OTHER): Payer: Medicare Other

## 2022-08-09 DIAGNOSIS — S72002D Fracture of unspecified part of neck of left femur, subsequent encounter for closed fracture with routine healing: Secondary | ICD-10-CM | POA: Diagnosis not present

## 2022-08-09 DIAGNOSIS — S52531D Colles' fracture of right radius, subsequent encounter for closed fracture with routine healing: Secondary | ICD-10-CM | POA: Diagnosis not present

## 2022-08-09 NOTE — Telephone Encounter (Signed)
I called Chrys Racer and advised.

## 2022-08-09 NOTE — Telephone Encounter (Signed)
Wbat weight bearing as tolerated

## 2022-08-09 NOTE — Progress Notes (Addendum)
Chief Complaint  Patient presents with   Routine Post Op   Hip Problem    ORIF left hip 06/11/2022    Encounter Diagnoses  Name Primary?   Closed fracture of left hip with routine healing, subsequent encounter Yes   Closed Colles' fracture of right radius with routine healing, subsequent encounter     9 weeks postop from a ORIF of the left hip she also has a new right distal radius fracture which is being treated in a cast.  Imaging of the hip fracture shows there is been no compromise in the hardware and the fracture is healing appropriately  Will see the patient in 4 weeks and x-ray both the right wrist and the left hip  She is weightbearing as tolerated in both  Addendum patient was having cast irritation with cast sore on the right hand cast was changed

## 2022-08-10 ENCOUNTER — Telehealth: Payer: Self-pay | Admitting: Orthopedic Surgery

## 2022-08-10 DIAGNOSIS — Z7982 Long term (current) use of aspirin: Secondary | ICD-10-CM | POA: Diagnosis not present

## 2022-08-10 DIAGNOSIS — I471 Supraventricular tachycardia, unspecified: Secondary | ICD-10-CM | POA: Diagnosis not present

## 2022-08-10 DIAGNOSIS — J449 Chronic obstructive pulmonary disease, unspecified: Secondary | ICD-10-CM | POA: Diagnosis not present

## 2022-08-10 DIAGNOSIS — K219 Gastro-esophageal reflux disease without esophagitis: Secondary | ICD-10-CM | POA: Diagnosis not present

## 2022-08-10 DIAGNOSIS — F17210 Nicotine dependence, cigarettes, uncomplicated: Secondary | ICD-10-CM | POA: Diagnosis not present

## 2022-08-10 DIAGNOSIS — I10 Essential (primary) hypertension: Secondary | ICD-10-CM | POA: Diagnosis not present

## 2022-08-10 DIAGNOSIS — S72045D Nondisplaced fracture of base of neck of left femur, subsequent encounter for closed fracture with routine healing: Secondary | ICD-10-CM | POA: Diagnosis not present

## 2022-08-10 DIAGNOSIS — Z9181 History of falling: Secondary | ICD-10-CM | POA: Diagnosis not present

## 2022-08-10 DIAGNOSIS — M1990 Unspecified osteoarthritis, unspecified site: Secondary | ICD-10-CM | POA: Diagnosis not present

## 2022-08-10 DIAGNOSIS — I429 Cardiomyopathy, unspecified: Secondary | ICD-10-CM | POA: Diagnosis not present

## 2022-08-10 DIAGNOSIS — R413 Other amnesia: Secondary | ICD-10-CM | POA: Diagnosis not present

## 2022-08-10 DIAGNOSIS — E559 Vitamin D deficiency, unspecified: Secondary | ICD-10-CM | POA: Diagnosis not present

## 2022-08-10 DIAGNOSIS — M545 Low back pain, unspecified: Secondary | ICD-10-CM | POA: Diagnosis not present

## 2022-08-10 NOTE — Telephone Encounter (Signed)
Chrys Racer PT w/[facility] [phone])  lvm stating that she needs clarification on weight-bearing status.  Stated that the patient has an upper extremity injury and has had a history of lower extremity injury.  She stated she had a message that the patient is weight-bearing status as tolerated, but she needs to know which extremity that is for.

## 2022-08-11 DIAGNOSIS — K219 Gastro-esophageal reflux disease without esophagitis: Secondary | ICD-10-CM | POA: Diagnosis not present

## 2022-08-11 DIAGNOSIS — S72045D Nondisplaced fracture of base of neck of left femur, subsequent encounter for closed fracture with routine healing: Secondary | ICD-10-CM | POA: Diagnosis not present

## 2022-08-11 DIAGNOSIS — Z9181 History of falling: Secondary | ICD-10-CM | POA: Diagnosis not present

## 2022-08-11 DIAGNOSIS — F17210 Nicotine dependence, cigarettes, uncomplicated: Secondary | ICD-10-CM | POA: Diagnosis not present

## 2022-08-11 DIAGNOSIS — M545 Low back pain, unspecified: Secondary | ICD-10-CM | POA: Diagnosis not present

## 2022-08-11 DIAGNOSIS — R413 Other amnesia: Secondary | ICD-10-CM | POA: Diagnosis not present

## 2022-08-11 DIAGNOSIS — Z7982 Long term (current) use of aspirin: Secondary | ICD-10-CM | POA: Diagnosis not present

## 2022-08-11 DIAGNOSIS — I10 Essential (primary) hypertension: Secondary | ICD-10-CM | POA: Diagnosis not present

## 2022-08-11 DIAGNOSIS — J449 Chronic obstructive pulmonary disease, unspecified: Secondary | ICD-10-CM | POA: Diagnosis not present

## 2022-08-11 DIAGNOSIS — I471 Supraventricular tachycardia, unspecified: Secondary | ICD-10-CM | POA: Diagnosis not present

## 2022-08-11 DIAGNOSIS — M1990 Unspecified osteoarthritis, unspecified site: Secondary | ICD-10-CM | POA: Diagnosis not present

## 2022-08-11 DIAGNOSIS — I429 Cardiomyopathy, unspecified: Secondary | ICD-10-CM | POA: Diagnosis not present

## 2022-08-11 DIAGNOSIS — E559 Vitamin D deficiency, unspecified: Secondary | ICD-10-CM | POA: Diagnosis not present

## 2022-08-11 NOTE — Telephone Encounter (Signed)
I called her to advise / 

## 2022-08-11 NOTE — Telephone Encounter (Signed)
WBAT both

## 2022-08-13 DIAGNOSIS — J449 Chronic obstructive pulmonary disease, unspecified: Secondary | ICD-10-CM | POA: Diagnosis not present

## 2022-08-13 DIAGNOSIS — Z7982 Long term (current) use of aspirin: Secondary | ICD-10-CM | POA: Diagnosis not present

## 2022-08-13 DIAGNOSIS — I471 Supraventricular tachycardia, unspecified: Secondary | ICD-10-CM | POA: Diagnosis not present

## 2022-08-13 DIAGNOSIS — K219 Gastro-esophageal reflux disease without esophagitis: Secondary | ICD-10-CM | POA: Diagnosis not present

## 2022-08-13 DIAGNOSIS — M545 Low back pain, unspecified: Secondary | ICD-10-CM | POA: Diagnosis not present

## 2022-08-13 DIAGNOSIS — R413 Other amnesia: Secondary | ICD-10-CM | POA: Diagnosis not present

## 2022-08-13 DIAGNOSIS — I10 Essential (primary) hypertension: Secondary | ICD-10-CM | POA: Diagnosis not present

## 2022-08-13 DIAGNOSIS — F17210 Nicotine dependence, cigarettes, uncomplicated: Secondary | ICD-10-CM | POA: Diagnosis not present

## 2022-08-13 DIAGNOSIS — S72045D Nondisplaced fracture of base of neck of left femur, subsequent encounter for closed fracture with routine healing: Secondary | ICD-10-CM | POA: Diagnosis not present

## 2022-08-13 DIAGNOSIS — Z9181 History of falling: Secondary | ICD-10-CM | POA: Diagnosis not present

## 2022-08-13 DIAGNOSIS — E559 Vitamin D deficiency, unspecified: Secondary | ICD-10-CM | POA: Diagnosis not present

## 2022-08-13 DIAGNOSIS — M1990 Unspecified osteoarthritis, unspecified site: Secondary | ICD-10-CM | POA: Diagnosis not present

## 2022-08-13 DIAGNOSIS — I429 Cardiomyopathy, unspecified: Secondary | ICD-10-CM | POA: Diagnosis not present

## 2022-08-15 NOTE — Progress Notes (Deleted)
GI Office Note    Referring Provider: Ralph Leyden, [place] Primary Care Physician:  Ralph Leyden, FNP Primary Gastroenterologist: [name].Rourk, MD   Date:  08/15/2022  ID:  Michelle Zavala, DOB 28-Aug-1946, MRN [id number]   Chief Complaint   No chief complaint on file.    History of Present Illness  Michelle Zavala is a 76 y.o. female with a history of anxiety, cardiomyopathy, COPD, chronic lung fibrosis, CAD, depression, GERD, HTN, colon polyps, and chronic constipation*** presenting today for follow up.   Colonoscopy June 2018: -sigmoid diverticulosis -polyps in ascending and descending colon -pathology with benign mucosa and tubular adenomas -Repeat TCS in 5 years  EGD December 2021: -normal esophagus s/p dilation -erythematous gastric mucosa s/p biopsy -normal duodenum -biopsies positive for reactive gastropathy, no H. Pylori  Seen hematology in August for consult of bruising. Reported large quantities of BC powders and fish oil. No evidence of hemorrhagic or bleeding disorder.   Last office visit 06/10/22. Intermittent melena. Black/tarry stools. No abdominal pain.Nausea without vomiting. Continues to use BC powders. Lack of appetite and early satiety. Reflux controlled on PPI.. 39lb weight loss in 2 years. Denies pepto bismol use. Constipation - only use Linzess as needed. Shortness of breat at baseline. Scheduled for EGD given melena and TCS as she was due for surveillance given colon polyps. Advised to stop aspirin powder use and other NSAIDs and PPI BID.   Patient had a fall leaving the office and was taken via EMS. Found to have hip fracture s/p repair and discharged to rehab. ED visit 07/27/22 for another fall resulting in closed comminuted fracture of distal radial metaphysis. 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 Splint placed.   Most recent labs 06/14/22: Hgb 13.2, MCV 93.8, platelets 292.   Recent visit with cardiology with some hypotension. Amlodipine  discontinued. Per list she was taking amlodipine 5 mg daily, HCTZ 25 daily, and Toprol-XL 75 (however patient reported only taking 50 mg daily).    Today:    Current Outpatient Medications  Medication Sig Dispense Refill   acetaminophen (TYLENOL) 650 MG CR tablet Take 1,300 mg by mouth every 8 (eight) hours as needed for pain.      albuterol (VENTOLIN HFA) 108 (90 Base) MCG/ACT inhaler Inhale 2 puffs into the lungs every 4 (four) hours as needed for wheezing or shortness of breath.     amitriptyline (ELAVIL) 75 MG tablet Take 75 mg by mouth at bedtime.  6   aspirin EC 81 MG tablet Take 1 tablet (81 mg total) by mouth daily. Swallow whole. 90 tablet 3   Cyanocobalamin (VITAMIN B-12) 5000 MCG TBDP Take 1 tablet by mouth daily.     docusate sodium (COLACE) 100 MG capsule Take 1 capsule (100 mg total) by mouth 2 (two) times daily. 10 capsule 0   hydrochlorothiazide (HYDRODIURIL) 25 MG tablet Take 1 tablet (25 mg total) by mouth every morning. 90 tablet 1   HYDROcodone-acetaminophen (NORCO) 5-325 MG tablet Take 1-2 tablets by mouth every 6 (six) hours as needed. 20 tablet 0   ipratropium (ATROVENT) 0.02 % nebulizer solution Take 0.5 mg by nebulization every 4 (four) hours as needed for wheezing or shortness of breath.     lamoTRIgine (LAMICTAL) 25 MG tablet Take 50 mg by mouth daily.     LORazepam (ATIVAN) 1 MG tablet Take 1 mg by mouth 2 (two) times daily.     Magnesium Oxide -Mg Supplement 500 MG TABS Take 500 mg by mouth daily.  meclizine (ANTIVERT) 25 MG tablet Take 25 mg by mouth 3 (three) times daily as needed for dizziness.     Melatonin 10 MG TABS Take 2 tablets by mouth at bedtime.     meloxicam (MOBIC) 15 MG tablet Take 15 mg by mouth daily as needed.     metoprolol succinate (TOPROL-XL) 50 MG 24 hr tablet Take 1.5 tablets (75 mg total) by mouth daily. Take with or immediately following a meal. 135 tablet 3   Multiple Vitamins-Minerals (PRESERVISION AREDS 2) CAPS Take 2 Capfuls by  mouth daily.     omeprazole (PRILOSEC) 40 MG capsule Take 40 mg by mouth 2 (two) times daily.     potassium chloride SA (KLOR-CON M) 20 MEQ tablet Take 2 tablets (40 mEq total) by mouth daily. 180 tablet 3   sertraline (ZOLOFT) 100 MG tablet Take 100 mg by mouth daily.     No current facility-administered medications for this visit.    Past Medical History:  Diagnosis Date   Anxiety    Arthritis    C2 cervical fracture ([place]) 09/17/2013   Traumatic fracture witth minimal displacement   Cardiomyopathy ([place])    a. EF 35-40% by echo in 08/2021   Chronic lung disease    Fibrosis - Dr. Koleen Nimrod   COPD (chronic obstructive pulmonary disease) ([place])    Coronary atherosclerosis of native coronary artery    a. Mild atherosclerosis by cath in 2010 b. NST in 07/2021 showing fixed defects but EF at 41% by NST and 35-40% by echo   Depression    Diverticulosis    Essential hypertension    GERD (gastroesophageal reflux disease)    PSVT (paroxysmal supraventricular tachycardia)     Past Surgical History:  Procedure Laterality Date   ABDOMINAL HYSTERECTOMY     ANTERIOR RELEASE VERTEBRAL BODY W/ POSTERIOR FUSION     BACTERIAL OVERGROWTH TEST N/A 02/19/2016   Procedure: BACTERIAL OVERGROWTH TEST;  Surgeon: Daneil Dolin, MD;  [facility];  Service: Endoscopy;  Laterality: N/A;  0700   BIOPSY N/A 08/04/2015   Procedure: BIOPSY;  Surgeon: Daneil Dolin, MD;  Location: [facility];  Service: Endoscopy;  Laterality: N/A;  Gastric   BIOPSY  02/21/2017   Procedure: BIOPSY;  Surgeon: Daneil Dolin, MD;  [facility];  Service: Endoscopy;;  ascending colon biopsy    BIOPSY  08/21/2020   Procedure: BIOPSY;  Surgeon: Daneil Dolin, MD;  [facility];  Service: Endoscopy;;   cataract surgery     [place]   COLONOSCOPY  June 2016   Dr. Britta Mccreedy: moderate diverticulosis in sigmoid colon, surveillance in 5 years    COLONOSCOPY WITH PROPOFOL  N/A 02/21/2017   Dr. Gala Romney: diverticulosis in sigmoid colon, tubular adenomas, segmental biopsies benign. Surveillance in 5 years    ESOPHAGOGASTRODUODENOSCOPY (EGD) WITH PROPOFOL N/A 08/04/2015   Dr. Rourk:abnormal gastric mucosa s/p biopsy. Reactive gastropathy. Negative H.pylori   ESOPHAGOGASTRODUODENOSCOPY (EGD) WITH PROPOFOL N/A 02/21/2017   Dr. Gala Romney: empiric esophageal dilatation, small hiatal hernia, otherwise normal   ESOPHAGOGASTRODUODENOSCOPY (EGD) WITH PROPOFOL N/A 08/21/2020   normal esophagus s/p dilation. Erythematous mucosa in stomach of doubtful clinical significant s/p biopsy. Negative H.pylori. Mild reactive gastropathy.    HIP PINNING,CANNULATED Left 06/11/2022   Procedure: PERCUTANEOUS FIXATION OF FEMORAL NECK;  Surgeon: Carole Civil, MD;  Location: [facility];  Service: Orthopedics;  Laterality: Left;   INCISIONAL HERNIA REPAIR N/A 12/03/2015   Procedure:  INCISIONAL HERNIORRHAPHY WITH MESH;  Surgeon: Aviva Signs, MD;  Location: [facility];  Service: General;  Laterality: N/A;   INSERTION OF MESH  12/03/2015   Procedure: INSERTION OF MESH;  Surgeon: Aviva Signs, MD;  Location: [facility];  Service: General;;   IR RADIOLOGIST EVAL & MGMT  09/22/2017   MALONEY DILATION N/A 02/21/2017   Procedure: [name] DILATION;  Surgeon: Daneil Dolin, MD;  [facility];  Service: Endoscopy;  Laterality: N/A;   MALONEY DILATION N/A 08/21/2020   Procedure: [name] DILATION;  Surgeon: Daneil Dolin, MD;  [facility];  Service: Endoscopy;  Laterality: N/A;   POLYPECTOMY  02/21/2017   Procedure: POLYPECTOMY;  Surgeon: Daneil Dolin, MD;  [facility];  Service: Endoscopy;;  hepatic flexure polyp cs   RIGHT/LEFT HEART CATH AND CORONARY ANGIOGRAPHY N/A 09/17/2021   Procedure: RIGHT/LEFT HEART CATH AND CORONARY ANGIOGRAPHY;  Surgeon: Nelva Bush, MD;  Location: [facility];  Service: Cardiovascular;  Laterality: N/A;   TMJ ARTHROSCOPY Bilateral 02/04/2015    Procedure: BILATERAL TEMPOROMANDIBULAR JOINT (TMJ) ARTHROSCOPY MENISECTOMY WITH FAT GRAFT FROM ABDOMEN ;  Surgeon: Diona Browner, DDS;  Location: [facility];  Service: Oral Surgery;  Laterality: Bilateral;   TOTAL KNEE ARTHROPLASTY Bilateral    TUBAL LIGATION      Family History  Problem Relation Age of Onset   Coronary artery disease Father        Premature   Heart disease Father    Coronary artery disease Brother        Premature   Coronary artery disease Sister        Premature   Colon cancer Son     Allergies as of 08/16/2022 - Review Complete 08/09/2022  Allergen Reaction Noted   Sulfonamide derivatives Hives    Atorvastatin  07/29/2022   Crestor [rosuvastatin]  07/29/2022   Entresto [sacubitril-valsartan]  07/29/2022   Farxiga [dapagliflozin]  07/29/2022    Social History   Socioeconomic History   Marital status: Widowed    Spouse name: Not on file   Number of children: 3   Years of education: 7   Highest education level: 7th grade  Occupational History   Occupation: Retired  Tobacco Use   Smoking status: Never    Passive exposure: Never   Smokeless tobacco: Never  Vaping Use   Vaping Use: Never used  Substance and Sexual Activity   Alcohol use: Not Currently    Comment: occasionally   Drug use: No   Sexual activity: Not Currently    Partners: Male    Birth control/protection: None, Surgical  Other Topics Concern   Not on file  Social History Narrative   Occasionally drinks [employer]    Social Determinants of Health   Financial Resource Strain: Low Risk  (04/22/2022)   Overall Financial Resource Strain (CARDIA)    Difficulty of Paying Living Expenses: Not hard at all  Food Insecurity: No Food Insecurity (04/22/2022)   Hunger Vital Sign    Worried About Running Out of Food in the Last Year: Never true    [place] in the Last Year: Never true  Transportation Needs: No Transportation Needs (04/22/2022)   PRAPARE - Hydrologist  (Medical): No    Lack of Transportation (Non-Medical): No  Physical Activity: Inactive (04/22/2022)   Exercise Vital Sign    Days of Exercise per Week: 0 days    Minutes of Exercise per Session: 0 min  Stress: Stress Concern Present (04/22/2022)   [place]    Feeling of Stress : To some extent  Social Connections: Moderately Isolated (04/22/2022)   Social Connection and Isolation Panel [NHANES]    Frequency of Communication with Friends and Family: Three times a week    Frequency of Social Gatherings with Friends and Family: Three times a week    Attends Religious Services: More than 4 times per year    Active Member of Clubs or Organizations: No    Attends Archivist Meetings: Never    Marital Status: Widowed     Review of Systems   Gen: Denies fever, chills, anorexia. Denies fatigue, weakness, weight loss.  CV: Denies chest pain, palpitations, syncope, peripheral edema, and claudication. Resp: Denies dyspnea at rest, cough, wheezing, coughing up blood, and pleurisy. GI: See HPI Derm: Denies rash, itching, dry skin Psych: Denies depression, anxiety, memory loss, confusion. No homicidal or suicidal ideation.  Heme: Denies bruising, bleeding, and enlarged lymph nodes.   Physical Exam   There were no vitals taken for this visit.  General:   Alert and oriented. No distress noted. Pleasant and cooperative.  Head:  Normocephalic and atraumatic. Eyes:  Conjuctiva clear without scleral icterus. Mouth:  Oral mucosa pink and moist. Good dentition. No lesions. Lungs:  Clear to auscultation bilaterally. No wheezes, rales, or rhonchi. No distress.  Heart:  S1, S2 present without murmurs appreciated.  Abdomen:  +BS, soft, non-tender and non-distended. No rebound or guarding. No HSM or masses noted. Rectal: *** Msk:  Symmetrical without gross deformities. Normal posture. Extremities:  Without edema. Neurologic:   Alert and  oriented x4 Psych:  Alert and cooperative. Normal mood and affect.   Assessment  Michelle Zavala is a 76 y.o. female with a history of anxiety, cardiomyopathy, COPD, chronic lung fibrosis, CAD, depression, GERD, HTN, colon polyps, and chronic constipation*** presenting today for follow up.  History of adenomatous colon polyps:   Melena:   Constipation:   GERD:   PLAN   *** Proceed with colonoscopy with propofol by Dr. Gala Romney in near future: the risks, benefits, and alternatives have been discussed with the patient in detail. The patient states understanding and desires to proceed. ASA 3 Hold iron for 10 days prior to procedure Pantoprazole 40 mg BID Avoids NSAIDs and aspirin powders Linzess for constipation High fiber diet CBC, iron panel    Venetia Night, MSN, FNP-BC, AGACNP-BC [facility]

## 2022-08-16 ENCOUNTER — Ambulatory Visit: Payer: Medicare Other | Admitting: Gastroenterology

## 2022-08-16 DIAGNOSIS — I10 Essential (primary) hypertension: Secondary | ICD-10-CM | POA: Diagnosis not present

## 2022-08-16 DIAGNOSIS — S72045D Nondisplaced fracture of base of neck of left femur, subsequent encounter for closed fracture with routine healing: Secondary | ICD-10-CM | POA: Diagnosis not present

## 2022-08-16 DIAGNOSIS — J449 Chronic obstructive pulmonary disease, unspecified: Secondary | ICD-10-CM | POA: Diagnosis not present

## 2022-08-16 DIAGNOSIS — M1990 Unspecified osteoarthritis, unspecified site: Secondary | ICD-10-CM | POA: Diagnosis not present

## 2022-08-16 DIAGNOSIS — Z7982 Long term (current) use of aspirin: Secondary | ICD-10-CM | POA: Diagnosis not present

## 2022-08-16 DIAGNOSIS — I471 Supraventricular tachycardia, unspecified: Secondary | ICD-10-CM | POA: Diagnosis not present

## 2022-08-16 DIAGNOSIS — R413 Other amnesia: Secondary | ICD-10-CM | POA: Diagnosis not present

## 2022-08-16 DIAGNOSIS — Z9181 History of falling: Secondary | ICD-10-CM | POA: Diagnosis not present

## 2022-08-16 DIAGNOSIS — E559 Vitamin D deficiency, unspecified: Secondary | ICD-10-CM | POA: Diagnosis not present

## 2022-08-16 DIAGNOSIS — I429 Cardiomyopathy, unspecified: Secondary | ICD-10-CM | POA: Diagnosis not present

## 2022-08-16 DIAGNOSIS — K219 Gastro-esophageal reflux disease without esophagitis: Secondary | ICD-10-CM | POA: Diagnosis not present

## 2022-08-16 DIAGNOSIS — F17210 Nicotine dependence, cigarettes, uncomplicated: Secondary | ICD-10-CM | POA: Diagnosis not present

## 2022-08-16 DIAGNOSIS — M545 Low back pain, unspecified: Secondary | ICD-10-CM | POA: Diagnosis not present

## 2022-08-18 ENCOUNTER — Ambulatory Visit (INDEPENDENT_AMBULATORY_CARE_PROVIDER_SITE_OTHER): Payer: Medicare Other

## 2022-08-18 ENCOUNTER — Ambulatory Visit (INDEPENDENT_AMBULATORY_CARE_PROVIDER_SITE_OTHER): Payer: Medicare Other | Admitting: Orthopedic Surgery

## 2022-08-18 DIAGNOSIS — S72002D Fracture of unspecified part of neck of left femur, subsequent encounter for closed fracture with routine healing: Secondary | ICD-10-CM

## 2022-08-18 DIAGNOSIS — S52531D Colles' fracture of right radius, subsequent encounter for closed fracture with routine healing: Secondary | ICD-10-CM | POA: Diagnosis not present

## 2022-08-18 DIAGNOSIS — S52531A Colles' fracture of right radius, initial encounter for closed fracture: Secondary | ICD-10-CM | POA: Diagnosis not present

## 2022-08-18 MED ORDER — ACETAMINOPHEN-CODEINE 300-30 MG PO TABS: 1.0000 | ORAL_TABLET | Freq: Four times a day (QID) | ORAL | 0 refills | Status: DC | PRN

## 2022-08-18 NOTE — Progress Notes (Signed)
Postop and fracture care follow-up  Encounter Diagnoses  Name Primary?   Closed fracture of left hip with routine healing, subsequent encounter    Closed Colles' fracture of right radius with routine healing, subsequent encounter Yes   Patient complains of pain in reference to the cast  Colles' fracture noted November 15/16th  No evidence of skin issues with the cast just pain at the fracture site and base of the thumb  Thumb extension still intact  Hip range of motion normal  Hip x-rays show fracture healing without complications of the hardware  The wrist fracture is healing well at 3 weeks  Recommend wrist brace follow-up x-rays right wrist in 4 weeks  Patient requested something for pain for the wrist  Meds ordered this encounter  Medications   acetaminophen-codeine (TYLENOL #3) 300-30 MG tablet    Sig: Take 1 tablet by mouth every 6 (six) hours as needed for moderate pain.    Dispense:  30 tablet    Refill:  0

## 2022-08-18 NOTE — Patient Instructions (Signed)
Wear brace for 4 weeks

## 2022-08-20 DIAGNOSIS — I429 Cardiomyopathy, unspecified: Secondary | ICD-10-CM | POA: Diagnosis not present

## 2022-08-20 DIAGNOSIS — I471 Supraventricular tachycardia, unspecified: Secondary | ICD-10-CM | POA: Diagnosis not present

## 2022-08-20 DIAGNOSIS — Z9181 History of falling: Secondary | ICD-10-CM | POA: Diagnosis not present

## 2022-08-20 DIAGNOSIS — M1990 Unspecified osteoarthritis, unspecified site: Secondary | ICD-10-CM | POA: Diagnosis not present

## 2022-08-20 DIAGNOSIS — I10 Essential (primary) hypertension: Secondary | ICD-10-CM | POA: Diagnosis not present

## 2022-08-20 DIAGNOSIS — F17210 Nicotine dependence, cigarettes, uncomplicated: Secondary | ICD-10-CM | POA: Diagnosis not present

## 2022-08-20 DIAGNOSIS — E559 Vitamin D deficiency, unspecified: Secondary | ICD-10-CM | POA: Diagnosis not present

## 2022-08-20 DIAGNOSIS — R413 Other amnesia: Secondary | ICD-10-CM | POA: Diagnosis not present

## 2022-08-20 DIAGNOSIS — J449 Chronic obstructive pulmonary disease, unspecified: Secondary | ICD-10-CM | POA: Diagnosis not present

## 2022-08-20 DIAGNOSIS — M545 Low back pain, unspecified: Secondary | ICD-10-CM | POA: Diagnosis not present

## 2022-08-20 DIAGNOSIS — S72045D Nondisplaced fracture of base of neck of left femur, subsequent encounter for closed fracture with routine healing: Secondary | ICD-10-CM | POA: Diagnosis not present

## 2022-08-20 DIAGNOSIS — K219 Gastro-esophageal reflux disease without esophagitis: Secondary | ICD-10-CM | POA: Diagnosis not present

## 2022-08-20 DIAGNOSIS — Z7982 Long term (current) use of aspirin: Secondary | ICD-10-CM | POA: Diagnosis not present

## 2022-08-23 ENCOUNTER — Other Ambulatory Visit: Payer: Self-pay | Admitting: Cardiology

## 2022-08-23 ENCOUNTER — Telehealth: Payer: Self-pay

## 2022-08-23 ENCOUNTER — Other Ambulatory Visit: Payer: Self-pay | Admitting: Gastroenterology

## 2022-08-23 DIAGNOSIS — K5900 Constipation, unspecified: Secondary | ICD-10-CM

## 2022-08-23 DIAGNOSIS — Z299 Encounter for prophylactic measures, unspecified: Secondary | ICD-10-CM | POA: Diagnosis not present

## 2022-08-23 DIAGNOSIS — I10 Essential (primary) hypertension: Secondary | ICD-10-CM | POA: Diagnosis not present

## 2022-08-23 DIAGNOSIS — I951 Orthostatic hypotension: Secondary | ICD-10-CM | POA: Diagnosis not present

## 2022-08-23 DIAGNOSIS — M25519 Pain in unspecified shoulder: Secondary | ICD-10-CM | POA: Diagnosis not present

## 2022-08-23 DIAGNOSIS — I700 Atherosclerosis of aorta: Secondary | ICD-10-CM | POA: Diagnosis not present

## 2022-08-23 MED ORDER — LINACLOTIDE 145 MCG PO CAPS: 145.0000 ug | ORAL_CAPSULE | Freq: Every day | ORAL | 6 refills | Status: DC

## 2022-08-23 NOTE — Telephone Encounter (Signed)
Refill request for Linzess 145 mcg capsule qty: 30 was received from [facility]. Pt was last seen on 06/10/22

## 2022-08-24 DIAGNOSIS — R42 Dizziness and giddiness: Secondary | ICD-10-CM | POA: Diagnosis not present

## 2022-08-24 DIAGNOSIS — R2689 Other abnormalities of gait and mobility: Secondary | ICD-10-CM | POA: Diagnosis not present

## 2022-08-24 DIAGNOSIS — J449 Chronic obstructive pulmonary disease, unspecified: Secondary | ICD-10-CM | POA: Diagnosis not present

## 2022-08-24 DIAGNOSIS — M6281 Muscle weakness (generalized): Secondary | ICD-10-CM | POA: Diagnosis not present

## 2022-08-24 DIAGNOSIS — S72012D Unspecified intracapsular fracture of left femur, subsequent encounter for closed fracture with routine healing: Secondary | ICD-10-CM | POA: Diagnosis not present

## 2022-08-25 DIAGNOSIS — Z7982 Long term (current) use of aspirin: Secondary | ICD-10-CM | POA: Diagnosis not present

## 2022-08-25 DIAGNOSIS — I10 Essential (primary) hypertension: Secondary | ICD-10-CM | POA: Diagnosis not present

## 2022-08-25 DIAGNOSIS — I471 Supraventricular tachycardia, unspecified: Secondary | ICD-10-CM | POA: Diagnosis not present

## 2022-08-25 DIAGNOSIS — S72045D Nondisplaced fracture of base of neck of left femur, subsequent encounter for closed fracture with routine healing: Secondary | ICD-10-CM | POA: Diagnosis not present

## 2022-08-25 DIAGNOSIS — M545 Low back pain, unspecified: Secondary | ICD-10-CM | POA: Diagnosis not present

## 2022-08-25 DIAGNOSIS — J449 Chronic obstructive pulmonary disease, unspecified: Secondary | ICD-10-CM | POA: Diagnosis not present

## 2022-08-25 DIAGNOSIS — Z9181 History of falling: Secondary | ICD-10-CM | POA: Diagnosis not present

## 2022-08-25 DIAGNOSIS — F17210 Nicotine dependence, cigarettes, uncomplicated: Secondary | ICD-10-CM | POA: Diagnosis not present

## 2022-08-25 DIAGNOSIS — K219 Gastro-esophageal reflux disease without esophagitis: Secondary | ICD-10-CM | POA: Diagnosis not present

## 2022-08-25 DIAGNOSIS — R413 Other amnesia: Secondary | ICD-10-CM | POA: Diagnosis not present

## 2022-08-25 DIAGNOSIS — I429 Cardiomyopathy, unspecified: Secondary | ICD-10-CM | POA: Diagnosis not present

## 2022-08-25 DIAGNOSIS — E559 Vitamin D deficiency, unspecified: Secondary | ICD-10-CM | POA: Diagnosis not present

## 2022-08-25 DIAGNOSIS — M1990 Unspecified osteoarthritis, unspecified site: Secondary | ICD-10-CM | POA: Diagnosis not present

## 2022-08-26 DIAGNOSIS — M1990 Unspecified osteoarthritis, unspecified site: Secondary | ICD-10-CM | POA: Diagnosis not present

## 2022-08-26 DIAGNOSIS — J449 Chronic obstructive pulmonary disease, unspecified: Secondary | ICD-10-CM | POA: Diagnosis not present

## 2022-08-26 DIAGNOSIS — Z7982 Long term (current) use of aspirin: Secondary | ICD-10-CM | POA: Diagnosis not present

## 2022-08-26 DIAGNOSIS — Z9181 History of falling: Secondary | ICD-10-CM | POA: Diagnosis not present

## 2022-08-26 DIAGNOSIS — K219 Gastro-esophageal reflux disease without esophagitis: Secondary | ICD-10-CM | POA: Diagnosis not present

## 2022-08-26 DIAGNOSIS — I471 Supraventricular tachycardia, unspecified: Secondary | ICD-10-CM | POA: Diagnosis not present

## 2022-08-26 DIAGNOSIS — F17210 Nicotine dependence, cigarettes, uncomplicated: Secondary | ICD-10-CM | POA: Diagnosis not present

## 2022-08-26 DIAGNOSIS — E559 Vitamin D deficiency, unspecified: Secondary | ICD-10-CM | POA: Diagnosis not present

## 2022-08-26 DIAGNOSIS — M545 Low back pain, unspecified: Secondary | ICD-10-CM | POA: Diagnosis not present

## 2022-08-26 DIAGNOSIS — S72045D Nondisplaced fracture of base of neck of left femur, subsequent encounter for closed fracture with routine healing: Secondary | ICD-10-CM | POA: Diagnosis not present

## 2022-08-26 DIAGNOSIS — R413 Other amnesia: Secondary | ICD-10-CM | POA: Diagnosis not present

## 2022-08-26 DIAGNOSIS — I10 Essential (primary) hypertension: Secondary | ICD-10-CM | POA: Diagnosis not present

## 2022-08-26 DIAGNOSIS — I429 Cardiomyopathy, unspecified: Secondary | ICD-10-CM | POA: Diagnosis not present

## 2022-08-30 DIAGNOSIS — L02429 Furuncle of limb, unspecified: Secondary | ICD-10-CM | POA: Diagnosis not present

## 2022-08-30 DIAGNOSIS — Z299 Encounter for prophylactic measures, unspecified: Secondary | ICD-10-CM | POA: Diagnosis not present

## 2022-08-30 DIAGNOSIS — R42 Dizziness and giddiness: Secondary | ICD-10-CM | POA: Diagnosis not present

## 2022-08-30 DIAGNOSIS — I10 Essential (primary) hypertension: Secondary | ICD-10-CM | POA: Diagnosis not present

## 2022-08-30 DIAGNOSIS — J069 Acute upper respiratory infection, unspecified: Secondary | ICD-10-CM | POA: Diagnosis not present

## 2022-08-30 DIAGNOSIS — Z0000 Encounter for general adult medical examination without abnormal findings: Secondary | ICD-10-CM | POA: Diagnosis not present

## 2022-08-31 DIAGNOSIS — I429 Cardiomyopathy, unspecified: Secondary | ICD-10-CM | POA: Diagnosis not present

## 2022-08-31 DIAGNOSIS — R413 Other amnesia: Secondary | ICD-10-CM | POA: Diagnosis not present

## 2022-08-31 DIAGNOSIS — M1990 Unspecified osteoarthritis, unspecified site: Secondary | ICD-10-CM | POA: Diagnosis not present

## 2022-08-31 DIAGNOSIS — I10 Essential (primary) hypertension: Secondary | ICD-10-CM | POA: Diagnosis not present

## 2022-08-31 DIAGNOSIS — Z7982 Long term (current) use of aspirin: Secondary | ICD-10-CM | POA: Diagnosis not present

## 2022-08-31 DIAGNOSIS — E559 Vitamin D deficiency, unspecified: Secondary | ICD-10-CM | POA: Diagnosis not present

## 2022-08-31 DIAGNOSIS — M545 Low back pain, unspecified: Secondary | ICD-10-CM | POA: Diagnosis not present

## 2022-08-31 DIAGNOSIS — Z9181 History of falling: Secondary | ICD-10-CM | POA: Diagnosis not present

## 2022-08-31 DIAGNOSIS — S72045D Nondisplaced fracture of base of neck of left femur, subsequent encounter for closed fracture with routine healing: Secondary | ICD-10-CM | POA: Diagnosis not present

## 2022-08-31 DIAGNOSIS — F17210 Nicotine dependence, cigarettes, uncomplicated: Secondary | ICD-10-CM | POA: Diagnosis not present

## 2022-08-31 DIAGNOSIS — J449 Chronic obstructive pulmonary disease, unspecified: Secondary | ICD-10-CM | POA: Diagnosis not present

## 2022-08-31 DIAGNOSIS — K219 Gastro-esophageal reflux disease without esophagitis: Secondary | ICD-10-CM | POA: Diagnosis not present

## 2022-08-31 DIAGNOSIS — I471 Supraventricular tachycardia, unspecified: Secondary | ICD-10-CM | POA: Diagnosis not present

## 2022-09-08 ENCOUNTER — Ambulatory Visit (INDEPENDENT_AMBULATORY_CARE_PROVIDER_SITE_OTHER): Payer: Medicare Other | Admitting: Orthopedic Surgery

## 2022-09-08 ENCOUNTER — Ambulatory Visit (INDEPENDENT_AMBULATORY_CARE_PROVIDER_SITE_OTHER): Payer: Medicare Other

## 2022-09-08 DIAGNOSIS — M5136 Other intervertebral disc degeneration, lumbar region: Secondary | ICD-10-CM

## 2022-09-08 DIAGNOSIS — S52531D Colles' fracture of right radius, subsequent encounter for closed fracture with routine healing: Secondary | ICD-10-CM

## 2022-09-08 DIAGNOSIS — M25511 Pain in right shoulder: Secondary | ICD-10-CM

## 2022-09-08 MED ORDER — METHYLPREDNISOLONE ACETATE 40 MG/ML IJ SUSP
40.0000 mg | Freq: Once | INTRAMUSCULAR | Status: AC
  Administered 2022-09-08: 40 mg via INTRA_ARTICULAR

## 2022-09-08 MED ORDER — METHOCARBAMOL 500 MG PO TABS: 500.0000 mg | ORAL_TABLET | Freq: Three times a day (TID) | ORAL | 1 refills | Status: DC

## 2022-09-08 NOTE — Progress Notes (Unsigned)
Chief Complaint  Patient presents with   Hip Injury    RT hip DOI 06/10/22   Wrist Injury    RT radius DOI 11/15-16/23    3 complaints   Right distal radius fracture #1  Left lumbar pain with radiation left hip #2  Right shoulder pain acute #3  The right distal radius fracture was x-rayed today and examined.  The patient has functional range of motion with minimal discomfort over the right distal radius.  The x-ray shows the fracture healed in slight dorsal tilt which has been persistent throughout the fracture follow-up.  The patient can remove the brace and follow-up as needed  Left lumbar pain with radiation left hip.  The patient had left hip surgery but says that the pain is not in the area of the hip surgery.  On examination she has tenderness in the lower back this tracks across the buttock area into the right posterior and lateral thigh.  The incision over the left hip is nontender with no erythema and she has normal range of motion of the hip without pain  This appears to be related to lumbar spine disease which is a chronic condition for her  We advised her to take some medication Meds ordered this encounter  Medications   methocarbamol (ROBAXIN) 500 MG tablet    Sig: Take 1 tablet (500 mg total) by mouth 3 (three) times daily.    Dispense:  60 tablet    Refill:  1   methylPREDNISolone acetate (DEPO-MEDROL) injection 40 mg    Problem #3 right shoulder pain acute on chronic.  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 After pinning the skin with alcohol and anesthetized the skin with ethyl chloride the subacromial space was injected using a 20-gauge needle. There were no complications  Sterile dressing was applied.     Encounter Diagnoses  Name Primary?   Closed Colles' fracture of right radius with routine healing, subsequent encounter 11/ 14/23    Acute pain of right shoulder    DDD (degenerative disc disease), lumbar Yes

## 2022-09-10 DIAGNOSIS — R42 Dizziness and giddiness: Secondary | ICD-10-CM | POA: Diagnosis not present

## 2022-09-10 DIAGNOSIS — R110 Nausea: Secondary | ICD-10-CM | POA: Diagnosis not present

## 2022-09-10 DIAGNOSIS — M7072 Other bursitis of hip, left hip: Secondary | ICD-10-CM | POA: Diagnosis not present

## 2022-09-10 DIAGNOSIS — Z299 Encounter for prophylactic measures, unspecified: Secondary | ICD-10-CM | POA: Diagnosis not present

## 2022-09-10 DIAGNOSIS — R059 Cough, unspecified: Secondary | ICD-10-CM | POA: Diagnosis not present

## 2022-09-14 DIAGNOSIS — J069 Acute upper respiratory infection, unspecified: Secondary | ICD-10-CM | POA: Diagnosis not present

## 2022-09-14 DIAGNOSIS — Z299 Encounter for prophylactic measures, unspecified: Secondary | ICD-10-CM | POA: Diagnosis not present

## 2022-09-14 DIAGNOSIS — R0981 Nasal congestion: Secondary | ICD-10-CM | POA: Diagnosis not present

## 2022-09-15 ENCOUNTER — Emergency Department (HOSPITAL_COMMUNITY): Payer: Medicare Other

## 2022-09-15 ENCOUNTER — Encounter (HOSPITAL_COMMUNITY): Payer: Self-pay | Admitting: Radiology

## 2022-09-15 ENCOUNTER — Emergency Department (HOSPITAL_COMMUNITY)
Admission: EM | Admit: 2022-09-15 | Discharge: 2022-09-16 | Disposition: A | Discharge status: Home / Self Care | Payer: Medicare Other | Attending: Emergency Medicine | Admitting: Emergency Medicine

## 2022-09-15 ENCOUNTER — Encounter: Payer: Medicare Other | Admitting: Orthopedic Surgery

## 2022-09-15 DIAGNOSIS — Z7982 Long term (current) use of aspirin: Secondary | ICD-10-CM | POA: Insufficient documentation

## 2022-09-15 DIAGNOSIS — J449 Chronic obstructive pulmonary disease, unspecified: Secondary | ICD-10-CM | POA: Insufficient documentation

## 2022-09-15 DIAGNOSIS — R059 Cough, unspecified: Secondary | ICD-10-CM | POA: Diagnosis not present

## 2022-09-15 DIAGNOSIS — R079 Chest pain, unspecified: Secondary | ICD-10-CM | POA: Diagnosis not present

## 2022-09-15 DIAGNOSIS — J029 Acute pharyngitis, unspecified: Secondary | ICD-10-CM | POA: Insufficient documentation

## 2022-09-15 DIAGNOSIS — E876 Hypokalemia: Secondary | ICD-10-CM | POA: Diagnosis not present

## 2022-09-15 DIAGNOSIS — R0602 Shortness of breath: Secondary | ICD-10-CM | POA: Diagnosis present

## 2022-09-15 DIAGNOSIS — R0789 Other chest pain: Secondary | ICD-10-CM | POA: Diagnosis not present

## 2022-09-15 DIAGNOSIS — J205 Acute bronchitis due to respiratory syncytial virus: Secondary | ICD-10-CM

## 2022-09-15 DIAGNOSIS — D72829 Elevated white blood cell count, unspecified: Secondary | ICD-10-CM | POA: Diagnosis not present

## 2022-09-15 DIAGNOSIS — Z79899 Other long term (current) drug therapy: Secondary | ICD-10-CM | POA: Insufficient documentation

## 2022-09-15 DIAGNOSIS — I2510 Atherosclerotic heart disease of native coronary artery without angina pectoris: Secondary | ICD-10-CM | POA: Insufficient documentation

## 2022-09-15 DIAGNOSIS — I129 Hypertensive chronic kidney disease with stage 1 through stage 4 chronic kidney disease, or unspecified chronic kidney disease: Secondary | ICD-10-CM | POA: Diagnosis not present

## 2022-09-15 DIAGNOSIS — J441 Chronic obstructive pulmonary disease with (acute) exacerbation: Secondary | ICD-10-CM | POA: Diagnosis not present

## 2022-09-15 DIAGNOSIS — N183 Chronic kidney disease, stage 3 unspecified: Secondary | ICD-10-CM | POA: Diagnosis not present

## 2022-09-15 DIAGNOSIS — Z20822 Contact with and (suspected) exposure to covid-19: Secondary | ICD-10-CM | POA: Diagnosis not present

## 2022-09-15 LAB — BASIC METABOLIC PANEL
Anion gap: 11 (ref 5–15)
BUN: 17 mg/dL (ref 8–23)
CO2: 26 mmol/L (ref 22–32)
Calcium: 9 mg/dL (ref 8.9–10.3)
Chloride: 96 mmol/L — ABNORMAL LOW (ref 98–111)
Creatinine, Ser: 0.85 mg/dL (ref 0.44–1.00)
GFR, Estimated: >60 mL/min (ref >60)
Glucose, Bld: 144 mg/dL — ABNORMAL HIGH (ref 70–99)
Potassium: 3.3 mmol/L — ABNORMAL LOW (ref 3.5–5.1)
Sodium: 133 mmol/L — ABNORMAL LOW (ref 135–145)

## 2022-09-15 LAB — RESP PANEL BY RT-PCR (RSV, FLU A&B, COVID)  RVPGX2
Influenza A by PCR: NEGATIVE
Influenza B by PCR: NEGATIVE
Resp Syncytial Virus by PCR: POSITIVE — AB
SARS Coronavirus 2 by RT PCR: NEGATIVE

## 2022-09-15 LAB — URINALYSIS, ROUTINE W REFLEX MICROSCOPIC
Bacteria, UA: NONE SEEN
Bilirubin Urine: NEGATIVE
Glucose, UA: NEGATIVE mg/dL
Hgb urine dipstick: NEGATIVE
Ketones, ur: NEGATIVE mg/dL
Nitrite: NEGATIVE
Protein, ur: NEGATIVE mg/dL
Specific Gravity, Urine: 1.02 (ref 1.005–1.030)
pH: 6 (ref 5.0–8.0)

## 2022-09-15 LAB — CBC
HCT: 40.6 % (ref 36.0–46.0)
Hemoglobin: 13.2 g/dL (ref 12.0–15.0)
MCH: 29.2 pg (ref 26.0–34.0)
MCHC: 32.5 g/dL (ref 30.0–36.0)
MCV: 89.8 fL (ref 80.0–100.0)
Platelets: 307 10*3/uL (ref 150–400)
RBC: 4.52 MIL/uL (ref 3.87–5.11)
RDW: 14 % (ref 11.5–15.5)
WBC: 19.3 10*3/uL — ABNORMAL HIGH (ref 4.0–10.5)
nRBC: 0 % (ref 0.0–0.2)

## 2022-09-15 LAB — TROPONIN I (HIGH SENSITIVITY)
Troponin I (High Sensitivity): 10 ng/L (ref <18)
Troponin I (High Sensitivity): 9 ng/L (ref <18)

## 2022-09-15 LAB — LACTIC ACID, PLASMA
Lactic Acid, Venous: 3.2 mmol/L (ref 0.5–1.9)
Lactic Acid, Venous: 3.2 mmol/L (ref 0.5–1.9)

## 2022-09-15 LAB — MAGNESIUM: Magnesium: 1.8 mg/dL (ref 1.7–2.4)

## 2022-09-15 MED ORDER — SODIUM CHLORIDE 0.9 % IV BOLUS
500.0000 mL | Freq: Once | INTRAVENOUS | Status: AC
  Administered 2022-09-15: 500 mL via INTRAVENOUS

## 2022-09-15 MED ORDER — IPRATROPIUM-ALBUTEROL 0.5-2.5 (3) MG/3ML IN SOLN: 3.0000 mL | Freq: Four times a day (QID) | RESPIRATORY_TRACT | 0 refills | Status: DC | PRN

## 2022-09-15 MED ORDER — AZITHROMYCIN 250 MG PO TABS: 250.0000 mg | ORAL_TABLET | Freq: Every day | ORAL | 0 refills | Status: DC

## 2022-09-15 MED ORDER — IPRATROPIUM-ALBUTEROL 0.5-2.5 (3) MG/3ML IN SOLN
3.0000 mL | Freq: Once | RESPIRATORY_TRACT | Status: AC
  Administered 2022-09-15: 3 mL via RESPIRATORY_TRACT
  Filled 2022-09-15: qty 3

## 2022-09-15 MED ORDER — ONDANSETRON HCL 4 MG/2ML IJ SOLN
4.0000 mg | Freq: Once | INTRAMUSCULAR | Status: AC
  Administered 2022-09-15: 4 mg via INTRAVENOUS
  Filled 2022-09-15: qty 2

## 2022-09-15 MED ORDER — POTASSIUM CHLORIDE CRYS ER 20 MEQ PO TBCR
40.0000 meq | EXTENDED_RELEASE_TABLET | Freq: Once | ORAL | Status: AC
  Administered 2022-09-15: 40 meq via ORAL
  Filled 2022-09-15: qty 2

## 2022-09-15 MED ORDER — IOHEXOL 350 MG/ML SOLN
75.0000 mL | Freq: Once | INTRAVENOUS | Status: AC | PRN
  Administered 2022-09-15: 75 mL via INTRAVENOUS

## 2022-09-15 MED ORDER — METHYLPREDNISOLONE SODIUM SUCC 125 MG IJ SOLR
125.0000 mg | Freq: Once | INTRAMUSCULAR | Status: AC
  Administered 2022-09-15: 125 mg via INTRAVENOUS
  Filled 2022-09-15: qty 2

## 2022-09-15 MED ORDER — LEVALBUTEROL HCL 0.63 MG/3ML IN NEBU
0.6300 mg | INHALATION_SOLUTION | Freq: Once | RESPIRATORY_TRACT | Status: AC
  Administered 2022-09-15: 0.63 mg via RESPIRATORY_TRACT
  Filled 2022-09-15: qty 3

## 2022-09-15 MED ORDER — SODIUM CHLORIDE 0.9 % IV BOLUS: 1000.0000 mL | Freq: Once | INTRAVENOUS | Status: DC

## 2022-09-15 MED ORDER — IPRATROPIUM-ALBUTEROL 0.5-2.5 (3) MG/3ML IN SOLN: 3.0000 mL | Freq: Once | RESPIRATORY_TRACT | Status: DC

## 2022-09-15 NOTE — ED Triage Notes (Signed)
Pt to ED c/o chest pain and cold symptoms/congestion x 2 weeks. Reports no relief with OTC medications.

## 2022-09-15 NOTE — ED Provider Notes (Signed)
[facility] EMERGENCY DEPARTMENT Provider Note   CSN: [id number] Arrival date & time: 09/15/22  1130     History  Chief Complaint  Patient presents with   URI   Chest Pain    Michelle Zavala is a 77 y.o. female.   URI Chest Pain   77 year old female presents emergency department with complaints of cough, congestion, sore throat for the past 2 weeks.  Patient reports symptoms worsening over the past few days.  Reports centralized chest pain that is worsened with coughing.  Reports some subjective shortness of breath with physical activity.  Denies fever, chills, night sweats, abdominal pain, nausea, vomiting, urinary symptoms, change in bowel habits.  Denies any known sick contacts.    Past medical history significant for GERD, hypertension, coronary artery disease, COPD, cardiomyopathy, paroxysmal supraventricular tachycardia, bilateral carotid artery dissection, CKD 3  Home Medications Prior to Admission medications   Medication Sig Start Date End Date Taking? Authorizing Provider  acetaminophen (TYLENOL) 650 MG CR tablet Take 1,300 mg by mouth every 8 (eight) hours as needed for pain.     [provider]  acetaminophen-codeine (TYLENOL #3) 300-30 MG tablet Take 1 tablet by mouth every 6 (six) hours as needed for moderate pain. 08/18/22   Carole Civil, MD  albuterol (VENTOLIN HFA) 108 (90 Base) MCG/ACT inhaler Inhale 2 puffs into the lungs every 4 (four) hours as needed for wheezing or shortness of breath.    [provider]  amitriptyline (ELAVIL) 75 MG tablet Take 75 mg by mouth at bedtime. 01/27/15   [provider]  amLODipine (NORVASC) 5 MG tablet Take 5 mg by mouth daily. 07/30/22   [provider]  aspirin EC 81 MG tablet Take 1 tablet (81 mg total) by mouth daily. Swallow whole. 07/29/22   Strader, Fransisco Hertz, PA-C  Cyanocobalamin (VITAMIN B-12) 5000 MCG TBDP Take 1 tablet by mouth daily.    [provider]  docusate sodium  (COLACE) 100 MG capsule Take 1 capsule (100 mg total) by mouth 2 (two) times daily. 06/14/22   Johnson, Clanford L, MD  hydrochlorothiazide (HYDRODIURIL) 25 MG tablet TAKE ONE TABLET BY MOUTH EVERY MORNING 08/23/22   Satira Sark, MD  ipratropium (ATROVENT) 0.02 % nebulizer solution Take 0.5 mg by nebulization every 4 (four) hours as needed for wheezing or shortness of breath.    [provider]  lamoTRIgine (LAMICTAL) 25 MG tablet Take 50 mg by mouth daily. 06/25/22   [provider]  levocetirizine (XYZAL) 5 MG tablet Take 5 mg by mouth at bedtime. 07/30/22   [provider]  linaclotide Rolan Lipa) 145 MCG CAPS capsule Take 1 capsule (145 mcg total) by mouth daily before breakfast. 08/23/22   Sherron Monday, NP  LORazepam (ATIVAN) 1 MG tablet Take 1 mg by mouth 2 (two) times daily.    [provider]  Magnesium Oxide -Mg Supplement 500 MG TABS Take 500 mg by mouth daily.    [provider]  meclizine (ANTIVERT) 25 MG tablet Take 25 mg by mouth 3 (three) times daily as needed for dizziness.    [provider]  Melatonin 10 MG TABS Take 2 tablets by mouth at bedtime.    [provider]  meloxicam (MOBIC) 15 MG tablet Take 15 mg by mouth daily as needed. 06/25/22   [provider]  methocarbamol (ROBAXIN) 500 MG tablet Take 1 tablet (500 mg total) by mouth 3 (three) times daily. 09/08/22   Carole Civil, MD  metoprolol succinate (TOPROL-XL) 50 MG 24 hr tablet Take 1.5 tablets (75 mg total) by mouth daily. Take with or immediately following a meal. 06/16/22   Satira Sark, MD  Multiple Vitamins-Minerals (PRESERVISION AREDS 2) CAPS Take 2 Capfuls by mouth daily.    [provider]  ondansetron (ZOFRAN) 4 MG tablet Take 4 mg by mouth every 8 (eight) hours as needed. 07/30/22   [provider]  pantoprazole (PROTONIX) 40 MG tablet Take 40 mg by mouth 2 (two) times daily. 07/30/22   [provider]  potassium chloride SA (KLOR-CON M) 20 MEQ tablet Take 2 tablets (40 mEq total) by mouth daily. 09/15/21   Strader, Fransisco Hertz, PA-C  sertraline (ZOLOFT) 100 MG tablet Take 100 mg by mouth daily.    [provider]      Allergies    Sulfonamide derivatives, Atorvastatin, Crestor [rosuvastatin], Entresto [sacubitril-valsartan], and Farxiga [dapagliflozin]    Review of Systems   Review of Systems  Cardiovascular:  Positive for chest pain.  All other systems reviewed and are negative.   Physical Exam Updated Vital Signs BP 114/89   Pulse (!) 106   Temp 98.4 F (36.9 C) (Oral)   Resp 20   Ht '5\' 3"'$  (1.6 m)   Wt 75.3 kg   SpO2 100%   BMI 29.41 kg/m  Physical Exam Vitals and nursing note reviewed.  Constitutional:      General: She is not in acute distress.    Appearance: She is well-developed.  HENT:     Head: Normocephalic and atraumatic.     Nose: Congestion and rhinorrhea present.     Mouth/Throat:     Comments: Mild posterior pharyngeal erythema noted.  Uvula midline right symmetric with phonation.  Tonsils are 1+ bilaterally with no obvious exudate.  No sublingual or submandibular swelling appreciated. Eyes:     Conjunctiva/sclera: Conjunctivae normal.  Cardiovascular:     Rate and Rhythm: Normal rate and regular rhythm.     Heart sounds: No murmur heard. Pulmonary:     Effort: Pulmonary effort is normal. No respiratory distress.     Breath sounds: Wheezing and rhonchi present.     Comments: Diffuse wheeze/rhonchi auscultated on respiratory exam. Abdominal:     Palpations: Abdomen is soft.     Tenderness: There is no abdominal tenderness.  Musculoskeletal:        General: No swelling.     Cervical back: Neck supple. No rigidity or tenderness.     Right lower leg: No edema.     Left lower leg: No edema.  Skin:    General: Skin is warm and dry.     Capillary Refill: Capillary refill takes less than 2 seconds.  Neurological:     Mental  Status: She is alert.  Psychiatric:        Mood and Affect: Mood normal.     ED Results / Procedures / Treatments   Labs (all labs ordered are listed, but only abnormal results are displayed) Labs Reviewed  RESP PANEL BY RT-PCR (RSV, FLU A&B, COVID)  RVPGX2 - Abnormal; Notable for the following components:      Result Value   Resp Syncytial Virus by PCR POSITIVE (*)    All other components within normal limits  BASIC METABOLIC PANEL - Abnormal; Notable for the following components:   Sodium 133 (*)    Potassium 3.3 (*)    Chloride 96 (*)    Glucose, Bld 144 (*)    All  other components within normal limits  CBC - Abnormal; Notable for the following components:   WBC 19.3 (*)    All other components within normal limits  CULTURE, BLOOD (ROUTINE X 2)  CULTURE, BLOOD (ROUTINE X 2)  MAGNESIUM  LACTIC ACID, PLASMA  LACTIC ACID, PLASMA  TROPONIN I (HIGH SENSITIVITY)  TROPONIN I (HIGH SENSITIVITY)    EKG EKG Interpretation  Date/Time:  Wednesday September 15 2022 11:48:54 EST Ventricular Rate:  116 PR Interval:  170 QRS Duration: 86 QT Interval:  348 QTC Calculation: 483 R Axis:   -42 Text Interpretation: Sinus tachycardia Left axis deviation Pulmonary disease pattern Minimal voltage criteria for LVH, may be normal variant ( R in aVL ) Septal infarct , age undetermined Marked ST abnormality, possible lateral subendocardial injury Abnormal ECG When compared with ECG of 14-Jun-2022 00:47, Septal infarct is now Present ST now depressed in Lateral leads Since last tracing rate faster Confirmed by Isla Pence [phone]) on 09/15/2022 12:42:08 PM  Radiology DG Chest 2 View  Result Date: 09/15/2022 CLINICAL DATA:  Chest pain.  Congestion. EXAM: CHEST - 2 VIEW COMPARISON:  CXR 06/10/22 FINDINGS: No pleural effusion. No pneumothorax. Unchanged cardiac and mediastinal contours. There is a focal airspace opacity at the left lung base, new compared to 06/02/2022. no displaced rib fractures.  Visualized upper abdomen is unremarkable. Partially visualized cervical spinal fusion hardware in place. Aortic atherosclerotic calcifications. Degenerative changes of the bilateral glenohumeral joints. IMPRESSION: Focal airspace opacity at the left lung base, new compared to 06/02/2022. This could represent atelectasis or developing pneumonia. Electronically Signed   By: Marin Roberts M.D.   On: 09/15/2022 12:10    Procedures Procedures    Medications Ordered in ED Medications  sodium chloride 0.9 % bolus 500 mL (has no administration in time range)  methylPREDNISolone sodium succinate (SOLU-MEDROL) 125 mg/2 mL injection 125 mg (has no administration in time range)  potassium chloride SA (KLOR-CON M) CR tablet 40 mEq (40 mEq Oral Given 09/15/22 1754)  ipratropium-albuterol (DUONEB) 0.5-2.5 (3) MG/3ML nebulizer solution 3 mL (3 mLs Nebulization Given 09/15/22 1756)  sodium chloride 0.9 % bolus 500 mL (500 mLs Intravenous New Bag/Given 09/15/22 1752)    ED Course/ Medical Decision Making/ A&P                           Medical Decision Making Amount and/or Complexity of Data Reviewed Labs: ordered. Radiology: ordered.  Risk Prescription drug management.   This patient presents to the ED for concern of flulike symptoms, this involves an extensive number of treatment options, and is a complaint that carries with it a high risk of complications and morbidity.  The differential diagnosis includes influenza, COVID, RSV, pneumonia, sepsis, meningitis   Co morbidities that complicate the patient evaluation  See HPI   Additional history obtained:  Additional history obtained from EMR External records from outside source obtained and reviewed including hospital records   Lab Tests:  I Ordered, and personally interpreted labs.  The pertinent results include: Leukocytosis of 19.3.  No evidence of anemia.  Platelets within normal range.  Multiple electrolyte abnormalities of hyponatremia with  sodium of 133 of which was supplemented via IV.  Potassium of 3.3 of which is supplemented orally with normal magnesium.  Decrease in chloride of 96.  Renal function within normal limits.  Initial troponin of 10 with repeat 9; EKG concerning for sinus tachycardia with ST depression in lateral leads with new appearing age-indeterminate  septal infarct.  Respiratory viral panel positive for RSV   Imaging Studies ordered:  I ordered imaging studies including chest x-ray, CT angio chest PE I independently visualized and interpreted imaging which showed  Chest x-ray: Focal airspace opacity at left lung base. CT angio chest PE: Pending I agree with the radiologist interpretation  Cardiac Monitoring: / EKG:  The patient was maintained on a cardiac monitor.  I personally viewed and interpreted the cardiac monitored which showed an underlying rhythm of: sinus tachycardia with ST depression in lateral leads with new appearing age-indeterminate septal infarct.   Consultations Obtained:  N/a   Problem List / ED Course / Critical interventions / Medication management  RSV plus or minus pneumonia I ordered medication including DuoNeb/Solu-Medrol for wheeze/rhonchi, 1000 cc of normal saline for rehydration.  Potassium chloride for hypokalemia   Reevaluation of the patient after these medicines showed that the patient improved I have reviewed the patients home medicines and have made adjustments as needed   Social Determinants of Health:  Denies tobacco, licit drug use.   Test / Admission - Considered:  RSV versus COPD exacerbation versus pneumonia Vitals signs significant for persistently tachycardic with a heart rate of 100 throughout history despite fluid administration. Otherwise within normal range and stable throughout visit. Laboratory/imaging studies significant for:.  See above Patient with symptoms concerning for COPD exacerbation on top of RSV plus or minus pneumonia.  Patient noted  symptoms improving with administration of breathing treatments as well as IV steroids.  Pending CT angio chest PE study for assessment further of possible pneumonia as depicted on chest x-ray.  Patient not hypoxic, not febrile but meets sepsis criteria if pneumonia is present.  Pending disposition once CT angio chest has resulted.  At shift change, patient care handed off to [facility]. Patient stable upon shift change.         Final Clinical Impression(s) / ED Diagnoses Final diagnoses:  None    Rx / DC Orders ED Discharge Orders     None         Wilnette Kales, [place] 09/15/22 Linus Orn, MD 09/19/22 1606

## 2022-09-15 NOTE — Discharge Instructions (Signed)
Have a respiratory infection called respiratory syncytial virus or RSV.  This is causing her wheezing and shortness of breath.  You have no signs of pneumonia or pulmonary embolus.  You do have a very high white blood cell count though this is likely due to your recent steroid injection.  Follow-up closely with your primary care doctor.  Since you are having green sputum when you cough you were given a prescription for antibiotics to take if this persist or you develop fever or other worsening symptoms.  If you have significantly worse symptoms or other worrisome changes come back to the ER.  Make sure you drink plenty of fluids and rest.

## 2022-09-15 NOTE — ED Notes (Signed)
E-signature pad broken, pt gives verbal consent  

## 2022-09-15 NOTE — ED Provider Notes (Addendum)
  Physical Exam  BP 114/89   Pulse (!) 106   Temp 98.4 F (36.9 C) (Oral)   Resp 20   Ht '5\' 3"'$  (1.6 m)   Wt 75.3 kg   SpO2 100%   BMI 29.41 kg/m   Physical Exam  Procedures  Procedures  ED Course / MDM   Clinical Course as of 09/15/22 2341  Wed Sep 15, 2022  2341 Note patient did have elevated lactic acid though this was after neb treatments could have been caused by that.  She is nontoxic in appearance. [CB]    Clinical Course User Index [CB] Gwenevere Abbot, PA-C   Medical Decision Making Amount and/or Complexity of Data Reviewed Labs: ordered. Radiology: ordered.  Risk Prescription drug management.   Patient signed out from previous APC.  Patient was having some subjective shortness of breath, was noted to have leukocytosis, concern for PE versus pneumonia with sepsis.  CT of her chest was negative for pneumonia as well as for PE.  Upon record review I see that she had an injection of Depo-Medrol on 12/27 which would account for her leukocytosis given her lack of source.  She does not have urinary symptoms and has a only small leuks.  Blood cultures and urine culture were sent.  She maintained 95% O2 on ambulation.  She has improvement of her breathing with DuoNeb.  She is positive for RSV.  Discussed with the patient she does not not meet admission criteria at this time.  She is well-appearing on my exam, her tachycardia improved with IV fluids.  Discussed strict follow-up and return precautions. Did have COPD and is having some purulent sputum.  Discussed with her that it could be the RSV driving a COPD exacerbation.  I given prescription for Zithromax and discussed with her that her symptoms are likely viral.  If her purulent sputum is persistent or she develops fever or worsening symptoms she can fill the Zithromax.  Otherwise follow-up close with her PCP and was given strict return precautions.  Reasonable with discharge home.  Differential diagnose includes pneumonia,  sepsis, PE, bronchitis, other       Gwenevere Abbot, PA-C 09/15/22 2327    Gwenevere Abbot, PA-C 09/15/22 2341    Sherwood Gambler, MD 09/19/22 1606

## 2022-09-15 NOTE — ED Notes (Signed)
Ambulated patient with SpO2 95% and pulse 97bmp. Patient did report being SOB.

## 2022-09-16 NOTE — ED Notes (Signed)
Pt given discharge paperwork, waiting in room for daughter to bring clean brief and pants.

## 2022-09-17 DIAGNOSIS — F03B3 Unspecified dementia, moderate, with mood disturbance: Secondary | ICD-10-CM | POA: Diagnosis not present

## 2022-09-17 DIAGNOSIS — K219 Gastro-esophageal reflux disease without esophagitis: Secondary | ICD-10-CM | POA: Diagnosis not present

## 2022-09-17 DIAGNOSIS — Z23 Encounter for immunization: Secondary | ICD-10-CM | POA: Diagnosis not present

## 2022-09-17 DIAGNOSIS — S335XXD Sprain of ligaments of lumbar spine, subsequent encounter: Secondary | ICD-10-CM | POA: Diagnosis not present

## 2022-09-18 DIAGNOSIS — F03B3 Unspecified dementia, moderate, with mood disturbance: Secondary | ICD-10-CM | POA: Diagnosis not present

## 2022-09-18 DIAGNOSIS — M810 Age-related osteoporosis without current pathological fracture: Secondary | ICD-10-CM | POA: Diagnosis not present

## 2022-09-18 DIAGNOSIS — S335XXD Sprain of ligaments of lumbar spine, subsequent encounter: Secondary | ICD-10-CM | POA: Diagnosis not present

## 2022-09-18 DIAGNOSIS — M1990 Unspecified osteoarthritis, unspecified site: Secondary | ICD-10-CM | POA: Diagnosis not present

## 2022-09-18 DIAGNOSIS — Z9181 History of falling: Secondary | ICD-10-CM | POA: Diagnosis not present

## 2022-09-18 LAB — URINE CULTURE: Culture: 90000 — AB

## 2022-09-19 ENCOUNTER — Telehealth (HOSPITAL_BASED_OUTPATIENT_CLINIC_OR_DEPARTMENT_OTHER): Payer: Self-pay | Admitting: *Deleted

## 2022-09-19 NOTE — Telephone Encounter (Signed)
Post ED Visit - Positive Culture Follow-up  Culture report reviewed by antimicrobial stewardship pharmacist: [place] Team '[]'$  Elenor Quinones, Pharm.D. '[]'$  Heide Guile, Pharm.D., BCPS AQ-ID '[]'$  Parks Neptune, Pharm.D., BCPS '[]'$  Alycia Rossetti, Pharm.D., BCPS '[]'$  [place], Pharm.D., BCPS, AAHIVP '[]'$  Legrand Como, Pharm.D., BCPS, AAHIVP '[]'$  Salome Arnt, PharmD, BCPS '[]'$  Johnnette Gourd, PharmD, BCPS '[]'$  Hughes Better, PharmD, BCPS '[]'$  Leeroy Cha, PharmD '[]'$  Laqueta Linden, PharmD, BCPS '[x]'$  Franchot Gallo, PharmD  [place] Team '[]'$  Leodis Sias, PharmD '[]'$  Lindell Spar, PharmD '[]'$  Royetta Asal, PharmD '[]'$  Graylin Shiver, Rph '[]'$  Rema Fendt) Glennon Mac, PharmD '[]'$  Arlyn Dunning, PharmD '[]'$  Netta Cedars, PharmD '[]'$  Dia Sitter, PharmD '[]'$  Leone Haven, PharmD '[]'$  Gretta Arab, PharmD '[]'$  Theodis Shove, PharmD '[]'$  Peggyann Juba, PharmD '[]'$  Reuel Boom, PharmD   Positive urine culture Treated with Azithromycin, organism sensitive to the same and no further patient follow-up is required at this time.  Rosie Fate 09/19/2022, 12:17 PM

## 2022-09-20 ENCOUNTER — Telehealth: Payer: Self-pay | Admitting: *Deleted

## 2022-09-20 DIAGNOSIS — F03B3 Unspecified dementia, moderate, with mood disturbance: Secondary | ICD-10-CM | POA: Diagnosis not present

## 2022-09-20 DIAGNOSIS — M1990 Unspecified osteoarthritis, unspecified site: Secondary | ICD-10-CM | POA: Diagnosis not present

## 2022-09-20 DIAGNOSIS — Z9181 History of falling: Secondary | ICD-10-CM | POA: Diagnosis not present

## 2022-09-20 DIAGNOSIS — M810 Age-related osteoporosis without current pathological fracture: Secondary | ICD-10-CM | POA: Diagnosis not present

## 2022-09-20 DIAGNOSIS — S335XXD Sprain of ligaments of lumbar spine, subsequent encounter: Secondary | ICD-10-CM | POA: Diagnosis not present

## 2022-09-20 LAB — CULTURE, BLOOD (ROUTINE X 2)
Culture: NO GROWTH
Culture: NO GROWTH
Special Requests: ADEQUATE
Special Requests: ADEQUATE

## 2022-09-20 NOTE — Telephone Encounter (Signed)
        Patient  visited [facility] on 09/16/2022  for treatment   Telephone encounter attempt :  1st  A HIPAA compliant voice message was left requesting a return call.  Instructed patient to call back at [phone].  [place] [phone] [address] , [place] [address] Email : [email or web address]

## 2022-09-21 ENCOUNTER — Telehealth: Payer: Self-pay | Admitting: *Deleted

## 2022-09-21 NOTE — Telephone Encounter (Signed)
        Patient  visited [facility] on 09/13/2022  for RSV   Telephone encounter attempt : 2nd   A HIPAA compliant voice message was left requesting a return call.  Instructed patient to call back at [phone]. [place] [phone] [address] , [place] [address] Email : [email or web address] [email or web address]

## 2022-09-22 DIAGNOSIS — Z9181 History of falling: Secondary | ICD-10-CM | POA: Diagnosis not present

## 2022-09-22 DIAGNOSIS — M810 Age-related osteoporosis without current pathological fracture: Secondary | ICD-10-CM | POA: Diagnosis not present

## 2022-09-22 DIAGNOSIS — S335XXD Sprain of ligaments of lumbar spine, subsequent encounter: Secondary | ICD-10-CM | POA: Diagnosis not present

## 2022-09-22 DIAGNOSIS — M1990 Unspecified osteoarthritis, unspecified site: Secondary | ICD-10-CM | POA: Diagnosis not present

## 2022-09-22 DIAGNOSIS — F03B3 Unspecified dementia, moderate, with mood disturbance: Secondary | ICD-10-CM | POA: Diagnosis not present

## 2022-09-24 DIAGNOSIS — S335XXD Sprain of ligaments of lumbar spine, subsequent encounter: Secondary | ICD-10-CM | POA: Diagnosis not present

## 2022-09-24 DIAGNOSIS — F03B3 Unspecified dementia, moderate, with mood disturbance: Secondary | ICD-10-CM | POA: Diagnosis not present

## 2022-09-24 DIAGNOSIS — J449 Chronic obstructive pulmonary disease, unspecified: Secondary | ICD-10-CM | POA: Diagnosis not present

## 2022-09-24 DIAGNOSIS — S72012D Unspecified intracapsular fracture of left femur, subsequent encounter for closed fracture with routine healing: Secondary | ICD-10-CM | POA: Diagnosis not present

## 2022-09-24 DIAGNOSIS — Z9181 History of falling: Secondary | ICD-10-CM | POA: Diagnosis not present

## 2022-09-24 DIAGNOSIS — R2689 Other abnormalities of gait and mobility: Secondary | ICD-10-CM | POA: Diagnosis not present

## 2022-09-24 DIAGNOSIS — M810 Age-related osteoporosis without current pathological fracture: Secondary | ICD-10-CM | POA: Diagnosis not present

## 2022-09-24 DIAGNOSIS — M6281 Muscle weakness (generalized): Secondary | ICD-10-CM | POA: Diagnosis not present

## 2022-09-24 DIAGNOSIS — R42 Dizziness and giddiness: Secondary | ICD-10-CM | POA: Diagnosis not present

## 2022-09-24 DIAGNOSIS — M1990 Unspecified osteoarthritis, unspecified site: Secondary | ICD-10-CM | POA: Diagnosis not present

## 2022-09-27 DIAGNOSIS — R110 Nausea: Secondary | ICD-10-CM | POA: Diagnosis not present

## 2022-09-27 DIAGNOSIS — Z299 Encounter for prophylactic measures, unspecified: Secondary | ICD-10-CM | POA: Diagnosis not present

## 2022-09-27 DIAGNOSIS — J441 Chronic obstructive pulmonary disease with (acute) exacerbation: Secondary | ICD-10-CM | POA: Diagnosis not present

## 2022-09-27 DIAGNOSIS — S335XXD Sprain of ligaments of lumbar spine, subsequent encounter: Secondary | ICD-10-CM | POA: Diagnosis not present

## 2022-09-27 DIAGNOSIS — F03B3 Unspecified dementia, moderate, with mood disturbance: Secondary | ICD-10-CM | POA: Diagnosis not present

## 2022-09-27 DIAGNOSIS — Z9181 History of falling: Secondary | ICD-10-CM | POA: Diagnosis not present

## 2022-09-27 DIAGNOSIS — I10 Essential (primary) hypertension: Secondary | ICD-10-CM | POA: Diagnosis not present

## 2022-09-27 DIAGNOSIS — D692 Other nonthrombocytopenic purpura: Secondary | ICD-10-CM | POA: Diagnosis not present

## 2022-09-27 DIAGNOSIS — M1990 Unspecified osteoarthritis, unspecified site: Secondary | ICD-10-CM | POA: Diagnosis not present

## 2022-09-27 DIAGNOSIS — M810 Age-related osteoporosis without current pathological fracture: Secondary | ICD-10-CM | POA: Diagnosis not present

## 2022-09-27 DIAGNOSIS — R260 Ataxic gait: Secondary | ICD-10-CM | POA: Diagnosis not present

## 2022-09-27 DIAGNOSIS — I429 Cardiomyopathy, unspecified: Secondary | ICD-10-CM | POA: Diagnosis not present

## 2022-09-28 ENCOUNTER — Telehealth: Payer: Self-pay | Admitting: Orthopedic Surgery

## 2022-09-28 NOTE — Telephone Encounter (Signed)
I am not a nurse, but I did call her she asked about gel injections in her shoulder, I explained the only gel injections we do are knee, she voiced understanding  She said the steroid injection in her shoulder did not help, she missed her last appointment She needs a call to schedule a follow up for her shoulder

## 2022-09-28 NOTE — Telephone Encounter (Signed)
Patient called at 3:11 pm and left voicemail saying she wants to talk with the nurse or the doctor..  Please call her back at [phone]

## 2022-09-29 DIAGNOSIS — M810 Age-related osteoporosis without current pathological fracture: Secondary | ICD-10-CM | POA: Diagnosis not present

## 2022-09-29 DIAGNOSIS — S335XXD Sprain of ligaments of lumbar spine, subsequent encounter: Secondary | ICD-10-CM | POA: Diagnosis not present

## 2022-09-29 DIAGNOSIS — M1990 Unspecified osteoarthritis, unspecified site: Secondary | ICD-10-CM | POA: Diagnosis not present

## 2022-09-29 DIAGNOSIS — F03B3 Unspecified dementia, moderate, with mood disturbance: Secondary | ICD-10-CM | POA: Diagnosis not present

## 2022-09-29 DIAGNOSIS — Z9181 History of falling: Secondary | ICD-10-CM | POA: Diagnosis not present

## 2022-09-29 NOTE — Telephone Encounter (Signed)
Amy I am sorry I called you a nurse, I was just repeating what the patient said on the voicemail.  I called the patient back and left voicemail to call us back.

## 2022-09-29 NOTE — Telephone Encounter (Signed)
Patient called back and states she has not talked with anyone about the gel injections.  I advised her that Amy has left and she can call her back tomorrow.[name]

## 2022-09-30 DIAGNOSIS — M810 Age-related osteoporosis without current pathological fracture: Secondary | ICD-10-CM | POA: Diagnosis not present

## 2022-09-30 DIAGNOSIS — F03B3 Unspecified dementia, moderate, with mood disturbance: Secondary | ICD-10-CM | POA: Diagnosis not present

## 2022-09-30 DIAGNOSIS — Z9181 History of falling: Secondary | ICD-10-CM | POA: Diagnosis not present

## 2022-09-30 DIAGNOSIS — M1990 Unspecified osteoarthritis, unspecified site: Secondary | ICD-10-CM | POA: Diagnosis not present

## 2022-09-30 DIAGNOSIS — S335XXD Sprain of ligaments of lumbar spine, subsequent encounter: Secondary | ICD-10-CM | POA: Diagnosis not present

## 2022-09-30 NOTE — Telephone Encounter (Signed)
I already talked to her, there are no gel injections for shoulder

## 2022-10-05 DIAGNOSIS — M1990 Unspecified osteoarthritis, unspecified site: Secondary | ICD-10-CM | POA: Diagnosis not present

## 2022-10-05 DIAGNOSIS — S335XXD Sprain of ligaments of lumbar spine, subsequent encounter: Secondary | ICD-10-CM | POA: Diagnosis not present

## 2022-10-05 DIAGNOSIS — F03B3 Unspecified dementia, moderate, with mood disturbance: Secondary | ICD-10-CM | POA: Diagnosis not present

## 2022-10-05 DIAGNOSIS — M810 Age-related osteoporosis without current pathological fracture: Secondary | ICD-10-CM | POA: Diagnosis not present

## 2022-10-05 DIAGNOSIS — Z9181 History of falling: Secondary | ICD-10-CM | POA: Diagnosis not present

## 2022-10-06 ENCOUNTER — Other Ambulatory Visit: Payer: Self-pay | Admitting: Cardiology

## 2022-10-06 DIAGNOSIS — M1990 Unspecified osteoarthritis, unspecified site: Secondary | ICD-10-CM | POA: Diagnosis not present

## 2022-10-06 DIAGNOSIS — S335XXD Sprain of ligaments of lumbar spine, subsequent encounter: Secondary | ICD-10-CM | POA: Diagnosis not present

## 2022-10-06 DIAGNOSIS — M810 Age-related osteoporosis without current pathological fracture: Secondary | ICD-10-CM | POA: Diagnosis not present

## 2022-10-06 DIAGNOSIS — F03B3 Unspecified dementia, moderate, with mood disturbance: Secondary | ICD-10-CM | POA: Diagnosis not present

## 2022-10-06 DIAGNOSIS — Z9181 History of falling: Secondary | ICD-10-CM | POA: Diagnosis not present

## 2022-10-07 DIAGNOSIS — M810 Age-related osteoporosis without current pathological fracture: Secondary | ICD-10-CM | POA: Diagnosis not present

## 2022-10-07 DIAGNOSIS — Z9181 History of falling: Secondary | ICD-10-CM | POA: Diagnosis not present

## 2022-10-07 DIAGNOSIS — F03B3 Unspecified dementia, moderate, with mood disturbance: Secondary | ICD-10-CM | POA: Diagnosis not present

## 2022-10-07 DIAGNOSIS — S335XXD Sprain of ligaments of lumbar spine, subsequent encounter: Secondary | ICD-10-CM | POA: Diagnosis not present

## 2022-10-07 DIAGNOSIS — M1990 Unspecified osteoarthritis, unspecified site: Secondary | ICD-10-CM | POA: Diagnosis not present

## 2022-10-08 DIAGNOSIS — N390 Urinary tract infection, site not specified: Secondary | ICD-10-CM | POA: Diagnosis not present

## 2022-10-11 ENCOUNTER — Telehealth: Payer: Self-pay | Admitting: Orthopedic Surgery

## 2022-10-11 ENCOUNTER — Encounter: Payer: Self-pay | Admitting: Cardiology

## 2022-10-11 ENCOUNTER — Ambulatory Visit: Payer: 59 | Attending: Cardiology | Admitting: Cardiology

## 2022-10-11 VITALS — BP 138/94 | HR 100 | Ht 63.0 in | Wt 168.0 lb

## 2022-10-11 DIAGNOSIS — I471 Supraventricular tachycardia, unspecified: Secondary | ICD-10-CM | POA: Diagnosis not present

## 2022-10-11 DIAGNOSIS — I2510 Atherosclerotic heart disease of native coronary artery without angina pectoris: Secondary | ICD-10-CM | POA: Diagnosis not present

## 2022-10-11 DIAGNOSIS — I5020 Unspecified systolic (congestive) heart failure: Secondary | ICD-10-CM | POA: Diagnosis not present

## 2022-10-11 NOTE — Telephone Encounter (Signed)
Patient son in law came in to make an appt for [facility], she was sitting in the truck.  I told him she had a post op appt on 09/15/22 and she no showed.   I made the appt for this Thursday 10/14/22 at 11:15, he said he will give it to her and if she can't make it he will deliver the message for her to call us and let us know, he can't do nothing about it ......    Is Thursday ok?

## 2022-10-11 NOTE — Patient Instructions (Signed)
Medication Instructions:  ?Your physician recommends that you continue on your current medications as directed. Please refer to the Current Medication list given to you today. ? ? ?Labwork: ?None today ? ?Testing/Procedures: ?None today ? ?Follow-Up: ?3 months ? ?Any Other Special Instructions Will Be Listed Below (If Applicable). ? ?If you need a refill on your cardiac medications before your next appointment, please call your pharmacy. ? ?

## 2022-10-11 NOTE — Progress Notes (Signed)
Cardiology Office Note  Date: 10/11/2022   ID: Michelle Zavala, DOB 06-20-1946, MRN [id number]  PCP:  Ralph Leyden, FNP  Cardiologist:  Rozann Lesches, MD Electrophysiologist:  None   Chief Complaint  Patient presents with   Cardiac follow-up    History of Present Illness: Michelle Zavala is a 77 y.o. female last seen in November 2023 by Ms. Strader PA-C, I reviewed the note.  She is here today with 2 family members that have been helping look after her.  She is in a wheelchair.  Complains of chronic weakness, still intermittent dizziness but no recent falls.  She does not report any chest pain.  Record review finds ER evaluation back in early January, diagnosed with RSV and possible COPD exacerbation.  Medical therapy has been quite limited in terms of GDMT with HFrEF and nonischemic cardiomyopathy.  She previously did not tolerate Entresto due to dizziness, reportedly felt weaker on Farxiga.  She was taken off amlodipine at the last visit due to hypotension, systolic blood pressure was reported to be in the 120s to 130s at home subsequently.  Still feels dizzy intermittently.  She is on HCTZ, we did discuss switching to Aldactone but she did not want to make any changes.  I went through her medication list and cleaned it up significantly based on review with family members list.  Past Medical History:  Diagnosis Date   Anxiety    Arthritis    C2 cervical fracture ([place]) 09/17/2013   Traumatic fracture witth minimal displacement   Cardiomyopathy ([place])    a. EF 35-40% by echo in 08/2021   Chronic lung disease    Fibrosis - Dr. Koleen Nimrod   COPD (chronic obstructive pulmonary disease) ([place])    Coronary atherosclerosis of native coronary artery    a. Mild atherosclerosis by cath in 2010 b. NST in 07/2021 showing fixed defects but EF at 41% by NST and 35-40% by echo   Depression    Diverticulosis    Essential hypertension    GERD (gastroesophageal reflux disease)    PSVT  (paroxysmal supraventricular tachycardia)     Current Outpatient Medications  Medication Sig Dispense Refill   acetaminophen (TYLENOL) 650 MG CR tablet Take 1,300 mg by mouth every 8 (eight) hours as needed for pain.      acetaminophen-codeine (TYLENOL #3) 300-30 MG tablet Take 1 tablet by mouth every 6 (six) hours as needed for moderate pain. 30 tablet 0   albuterol (VENTOLIN HFA) 108 (90 Base) MCG/ACT inhaler Inhale 2 puffs into the lungs every 4 (four) hours as needed for wheezing or shortness of breath.     amitriptyline (ELAVIL) 75 MG tablet Take 75 mg by mouth at bedtime.  6   Cyanocobalamin (VITAMIN B-12) 5000 MCG TBDP Take 1 tablet by mouth daily.     hydrochlorothiazide (HYDRODIURIL) 25 MG tablet TAKE ONE TABLET BY MOUTH EVERY MORNING 90 tablet 1   ipratropium-albuterol (DUONEB) 0.5-2.5 (3) MG/3ML SOLN Take 3 mLs by nebulization every 6 (six) hours as needed for up to 60 doses. 180 mL 0   methocarbamol (ROBAXIN) 500 MG tablet Take 1 tablet (500 mg total) by mouth 3 (three) times daily. 60 tablet 1   metoprolol succinate (TOPROL-XL) 50 MG 24 hr tablet TAKE 1 AND 1/2 TABLETS BY MOUTH DAILY WITH OR imediately following a MEAL. 135 tablet 1   pantoprazole (PROTONIX) 40 MG tablet Take 40 mg by mouth 2 (two) times daily.     potassium chloride SA (KLOR-CON M)  20 MEQ tablet Take 2 tablets (40 mEq total) by mouth daily. 180 tablet 3   sertraline (ZOLOFT) 100 MG tablet Take 100 mg by mouth daily.     No current facility-administered medications for this visit.   Allergies:  Sulfonamide derivatives, Atorvastatin, Crestor [rosuvastatin], Entresto [sacubitril-valsartan], and Farxiga [dapagliflozin]   ROS: No frank syncope.  Physical Exam: VS:  BP (!) 138/94   Pulse 100   Ht '5\' 3"'$  (1.6 m)   Wt 168 lb (76.2 kg)   SpO2 97%   BMI 29.76 kg/m , BMI Body mass index is 29.76 kg/m.  Wt Readings from Last 3 Encounters:  10/11/22 168 lb (76.2 kg)  09/15/22 166 lb (75.3 kg)  07/29/22 166 lb 9.6  oz (75.6 kg)    General: Patient appears comfortable at rest. HEENT: Conjunctiva and lids normal. Neck: Supple, no elevated JVP or carotid bruits. Lungs: Decreased breath sounds without wheezing. Cardiac: Regular rate and rhythm, no gallop. Extremities: No pitting edema.  ECG:  An ECG dated 09/15/2022 was personally reviewed today and demonstrated:  Sinus tachycardia with left anterior fascicular block, decreased R wave progression.  Nonspecific ST changes.  Recent Labwork: February 2023: Cholesterol 260, triglycerides 128, HDL 60, LDL 177 06/10/2022: ALT 18; AST 21 09/15/2022: BUN 17; Creatinine, Ser 0.85; Hemoglobin 13.2; Magnesium 1.8; Platelets 307; Potassium 3.3; Sodium 133   Other Studies Reviewed Today:  Echocardiogram 08/17/2021:  1. Left ventricular ejection fraction, by estimation, is 35 to 40%. The  left ventricle has moderately decreased function. The left ventricle  demonstrates regional wall motion abnormalities (see scoring  diagram/findings for description). Left ventricular   diastolic parameters are consistent with Grade I diastolic dysfunction  (impaired relaxation). Abnormal global longitudinal strain of -9.8%.   2. Right ventricular systolic function is mildly reduced. The right  ventricular size is normal. There is normal pulmonary artery systolic  pressure. The estimated right ventricular systolic pressure is 33.8 mmHg.   3. Left atrial size was moderately dilated.   4. The mitral valve is abnormal. Trivial mitral valve regurgitation.   5. The aortic valve is tricuspid. Aortic valve regurgitation is mild to  moderate. Aortic regurgitation PHT measures 772 msec.   6. Aortic dilatation noted. There is moderate dilatation of the ascending  aorta, measuring 44 mm.   7. The inferior vena cava is normal in size with greater than 50%  respiratory variability, suggesting right atrial pressure of 3 mmHg.    Chest CTA 09/15/2022: IMPRESSION: 1. Negative for acute pulmonary  embolus or aortic dissection. 2. No acute airspace disease or pleural effusion. Mild bronchiectasis in the left greater than right lower lobes. 3. Aneurysmal dilatation of the ascending aorta measuring up to 4.2 cm. Recommend annual imaging followup by CTA or MRA. This recommendation follows 2010 ACCF/AHA/AATS/ACR/ASA/SCA/SCAI/SIR/STS/SVM Guidelines for the Diagnosis and Management of Patients with Thoracic Aortic Disease. Circulation. 2010; 121: S505-L976. Aortic aneurysm NOS (ICD10-I71.9) 4. Aortic atherosclerosis.   Aortic Atherosclerosis (ICD10-I70.0).  Assessment and Plan:  1.  HFrEF with nonischemic cardiomyopathy.  GDMT is significantly limited by medication intolerances and fluctuating blood pressure as discussed above.  She has also been very hesitant to make any medication changes.  For now we will continue Toprol-XL at 50 mg daily which she seems to be tolerating, HCTZ and potassium supplement.  I do think attempting a switch from HCTZ to Aldactone would be a good reasonable next step, and then depending on blood pressure seeing if we might be able to start low-dose ARB.  We will continue to talk with her about this going forward.  In the meanwhile would continue to track blood pressure at home.  2.  History of nonobstructive CAD, no active angina.  She does not take aspirin on a regular basis and stopped statin therapy on her own concerned about possible side effects.  3.  PSVT by history, no active palpitations.  She is tolerating Toprol-XL at 50 mg daily.  4.  Asymptomatic ascending thoracic aortic aneurysm measuring 4.2 cm by chest CTA in January of this year.  Medication Adjustments/Labs and Tests Ordered: Current medicines are reviewed at length with the patient today.  Concerns regarding medicines are outlined above.   Tests Ordered: No orders of the defined types were placed in this encounter.   Medication Changes: No orders of the defined types were placed in this  encounter.   Disposition:  Follow up  3 months.  Signed, Satira Sark, MD, [facility] 10/11/2022 1:44 PM    [facility] [address], [place], [place] [address] Phone: [phone]; Fax: [phone]

## 2022-10-12 DIAGNOSIS — Z9181 History of falling: Secondary | ICD-10-CM | POA: Diagnosis not present

## 2022-10-12 DIAGNOSIS — F03B3 Unspecified dementia, moderate, with mood disturbance: Secondary | ICD-10-CM | POA: Diagnosis not present

## 2022-10-12 DIAGNOSIS — M810 Age-related osteoporosis without current pathological fracture: Secondary | ICD-10-CM | POA: Diagnosis not present

## 2022-10-12 DIAGNOSIS — M1990 Unspecified osteoarthritis, unspecified site: Secondary | ICD-10-CM | POA: Diagnosis not present

## 2022-10-12 DIAGNOSIS — S335XXD Sprain of ligaments of lumbar spine, subsequent encounter: Secondary | ICD-10-CM | POA: Diagnosis not present

## 2022-10-13 DIAGNOSIS — I10 Essential (primary) hypertension: Secondary | ICD-10-CM | POA: Diagnosis not present

## 2022-10-13 DIAGNOSIS — E1165 Type 2 diabetes mellitus with hyperglycemia: Secondary | ICD-10-CM | POA: Diagnosis not present

## 2022-10-13 DIAGNOSIS — K219 Gastro-esophageal reflux disease without esophagitis: Secondary | ICD-10-CM | POA: Diagnosis not present

## 2022-10-14 ENCOUNTER — Encounter: Payer: Self-pay | Admitting: Orthopedic Surgery

## 2022-10-14 ENCOUNTER — Ambulatory Visit (INDEPENDENT_AMBULATORY_CARE_PROVIDER_SITE_OTHER): Payer: 59

## 2022-10-14 ENCOUNTER — Ambulatory Visit (INDEPENDENT_AMBULATORY_CARE_PROVIDER_SITE_OTHER): Payer: 59 | Admitting: Orthopedic Surgery

## 2022-10-14 DIAGNOSIS — G8929 Other chronic pain: Secondary | ICD-10-CM

## 2022-10-14 DIAGNOSIS — S52531D Colles' fracture of right radius, subsequent encounter for closed fracture with routine healing: Secondary | ICD-10-CM | POA: Diagnosis not present

## 2022-10-14 DIAGNOSIS — M25511 Pain in right shoulder: Secondary | ICD-10-CM | POA: Diagnosis not present

## 2022-10-14 DIAGNOSIS — S335XXD Sprain of ligaments of lumbar spine, subsequent encounter: Secondary | ICD-10-CM | POA: Diagnosis not present

## 2022-10-14 DIAGNOSIS — Z9181 History of falling: Secondary | ICD-10-CM | POA: Diagnosis not present

## 2022-10-14 DIAGNOSIS — M1990 Unspecified osteoarthritis, unspecified site: Secondary | ICD-10-CM | POA: Diagnosis not present

## 2022-10-14 DIAGNOSIS — F03B3 Unspecified dementia, moderate, with mood disturbance: Secondary | ICD-10-CM | POA: Diagnosis not present

## 2022-10-14 DIAGNOSIS — M12811 Other specific arthropathies, not elsewhere classified, right shoulder: Secondary | ICD-10-CM | POA: Diagnosis not present

## 2022-10-14 DIAGNOSIS — M810 Age-related osteoporosis without current pathological fracture: Secondary | ICD-10-CM | POA: Diagnosis not present

## 2022-10-14 DIAGNOSIS — M7061 Trochanteric bursitis, right hip: Secondary | ICD-10-CM | POA: Diagnosis not present

## 2022-10-14 MED ORDER — METHYLPREDNISOLONE ACETATE 40 MG/ML IJ SUSP
40.0000 mg | Freq: Once | INTRAMUSCULAR | Status: AC
  Administered 2022-10-14: 40 mg via INTRA_ARTICULAR

## 2022-10-14 NOTE — Progress Notes (Signed)
Follow-up visit  Chief Complaint  Patient presents with   Wrist Injury    States entire right arm is painful 07/28/22 fracture    Other    Wants injection in right hip states she feels like her leg is giving out     Problem #1 right wrist injury fracture treated with cast no pain or tenderness, note: Old ulnar shaft fracture as well.  Not bothering her.  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  She has had an injection and failed  I discussed with her getting a CT scan and proceeding with reverse shoulder replacement she wants to hold off on that  As far as her physical exam goes she actually has good strength in abduction but she has decreased external rotation she has pain with resisted abduction and has pain in the empty can position with only mild weakness.  Her range of motion is limited passively and actively at 120 degrees in the scapular plane  Problem #3 she wants an injection in her right hip over the trochanter which she says usually lasts for several months  Procedure note injection for right hip bursitis  Verbal consent was obtained for injection of the right hip  Timeout was completed to confirm the injection site  The medications used were 40 mg of Depo-Medrol and 1% lidocaine 3 cc  Anesthesia was provided by ethyl chloride and the skin was prepped with alcohol.  After cleaning the skin with alcohol a 25-gauge needle was used to inject the greater trochanteric bursa right hip   No complications were noted    Follow-up as needed she may change her mind regarding the shoulder at which time we would do a CT scan as Dr. Loletha Grayer does when he is preparing for reverse shoulder replacement and then refer to him  for further management   Recommend Tylenol extra strength 500 mg every 6 hours around-the-clock

## 2022-10-15 DIAGNOSIS — N390 Urinary tract infection, site not specified: Secondary | ICD-10-CM | POA: Diagnosis not present

## 2022-10-15 DIAGNOSIS — M47816 Spondylosis without myelopathy or radiculopathy, lumbar region: Secondary | ICD-10-CM | POA: Diagnosis not present

## 2022-10-15 DIAGNOSIS — R269 Unspecified abnormalities of gait and mobility: Secondary | ICD-10-CM | POA: Diagnosis not present

## 2022-10-18 ENCOUNTER — Emergency Department (HOSPITAL_COMMUNITY): Payer: 59

## 2022-10-18 ENCOUNTER — Other Ambulatory Visit: Payer: Self-pay

## 2022-10-18 ENCOUNTER — Encounter (HOSPITAL_COMMUNITY): Payer: Self-pay

## 2022-10-18 ENCOUNTER — Emergency Department (HOSPITAL_COMMUNITY)
Admission: EM | Admit: 2022-10-18 | Discharge: 2022-10-18 | Disposition: A | Discharge status: Home / Self Care | Payer: 59 | Attending: Emergency Medicine | Admitting: Emergency Medicine

## 2022-10-18 DIAGNOSIS — S52502A Unspecified fracture of the lower end of left radius, initial encounter for closed fracture: Secondary | ICD-10-CM | POA: Insufficient documentation

## 2022-10-18 DIAGNOSIS — M25532 Pain in left wrist: Secondary | ICD-10-CM | POA: Diagnosis not present

## 2022-10-18 DIAGNOSIS — S43001A Unspecified subluxation of right shoulder joint, initial encounter: Secondary | ICD-10-CM | POA: Diagnosis not present

## 2022-10-18 DIAGNOSIS — R609 Edema, unspecified: Secondary | ICD-10-CM | POA: Diagnosis not present

## 2022-10-18 DIAGNOSIS — W109XXA Fall (on) (from) unspecified stairs and steps, initial encounter: Secondary | ICD-10-CM | POA: Diagnosis not present

## 2022-10-18 DIAGNOSIS — S52501A Unspecified fracture of the lower end of right radius, initial encounter for closed fracture: Secondary | ICD-10-CM | POA: Diagnosis not present

## 2022-10-18 DIAGNOSIS — S52602A Unspecified fracture of lower end of left ulna, initial encounter for closed fracture: Secondary | ICD-10-CM | POA: Diagnosis not present

## 2022-10-18 DIAGNOSIS — M542 Cervicalgia: Secondary | ICD-10-CM | POA: Insufficient documentation

## 2022-10-18 DIAGNOSIS — R110 Nausea: Secondary | ICD-10-CM | POA: Insufficient documentation

## 2022-10-18 DIAGNOSIS — S022XXA Fracture of nasal bones, initial encounter for closed fracture: Secondary | ICD-10-CM | POA: Diagnosis not present

## 2022-10-18 DIAGNOSIS — M25551 Pain in right hip: Secondary | ICD-10-CM | POA: Diagnosis not present

## 2022-10-18 DIAGNOSIS — M25539 Pain in unspecified wrist: Secondary | ICD-10-CM | POA: Diagnosis not present

## 2022-10-18 DIAGNOSIS — Z043 Encounter for examination and observation following other accident: Secondary | ICD-10-CM | POA: Diagnosis not present

## 2022-10-18 DIAGNOSIS — S52201A Unspecified fracture of shaft of right ulna, initial encounter for closed fracture: Secondary | ICD-10-CM | POA: Diagnosis not present

## 2022-10-18 DIAGNOSIS — S52571A Other intraarticular fracture of lower end of right radius, initial encounter for closed fracture: Secondary | ICD-10-CM | POA: Diagnosis not present

## 2022-10-18 DIAGNOSIS — R519 Headache, unspecified: Secondary | ICD-10-CM | POA: Diagnosis not present

## 2022-10-18 DIAGNOSIS — M545 Low back pain, unspecified: Secondary | ICD-10-CM | POA: Diagnosis not present

## 2022-10-18 DIAGNOSIS — Z743 Need for continuous supervision: Secondary | ICD-10-CM | POA: Diagnosis not present

## 2022-10-18 DIAGNOSIS — M50321 Other cervical disc degeneration at C4-C5 level: Secondary | ICD-10-CM | POA: Diagnosis not present

## 2022-10-18 DIAGNOSIS — M25511 Pain in right shoulder: Secondary | ICD-10-CM | POA: Diagnosis not present

## 2022-10-18 DIAGNOSIS — M4312 Spondylolisthesis, cervical region: Secondary | ICD-10-CM | POA: Diagnosis not present

## 2022-10-18 DIAGNOSIS — R6889 Other general symptoms and signs: Secondary | ICD-10-CM | POA: Diagnosis not present

## 2022-10-18 DIAGNOSIS — S22080A Wedge compression fracture of T11-T12 vertebra, initial encounter for closed fracture: Secondary | ICD-10-CM | POA: Diagnosis not present

## 2022-10-18 DIAGNOSIS — R600 Localized edema: Secondary | ICD-10-CM | POA: Diagnosis not present

## 2022-10-18 DIAGNOSIS — S0003XA Contusion of scalp, initial encounter: Secondary | ICD-10-CM | POA: Diagnosis not present

## 2022-10-18 LAB — CBC WITH DIFFERENTIAL/PLATELET
Abs Immature Granulocytes: 0.23 10*3/uL — ABNORMAL HIGH (ref 0.00–0.07)
Basophils Absolute: 0.1 10*3/uL (ref 0.0–0.1)
Basophils Relative: 0 %
Eosinophils Absolute: 0 10*3/uL (ref 0.0–0.5)
Eosinophils Relative: 0 %
HCT: 39.4 % (ref 36.0–46.0)
Hemoglobin: 13 g/dL (ref 12.0–15.0)
Immature Granulocytes: 1 %
Lymphocytes Relative: 10 %
Lymphs Abs: 1.8 10*3/uL (ref 0.7–4.0)
MCH: 29.4 pg (ref 26.0–34.0)
MCHC: 33 g/dL (ref 30.0–36.0)
MCV: 89.1 fL (ref 80.0–100.0)
Monocytes Absolute: 1 10*3/uL (ref 0.1–1.0)
Monocytes Relative: 6 %
Neutro Abs: 15.4 10*3/uL — ABNORMAL HIGH (ref 1.7–7.7)
Neutrophils Relative %: 83 %
Platelets: 295 10*3/uL (ref 150–400)
RBC: 4.42 MIL/uL (ref 3.87–5.11)
RDW: 13.8 % (ref 11.5–15.5)
WBC: 18.5 10*3/uL — ABNORMAL HIGH (ref 4.0–10.5)
nRBC: 0 % (ref 0.0–0.2)

## 2022-10-18 LAB — COMPREHENSIVE METABOLIC PANEL
ALT: 15 U/L (ref 0–44)
AST: 24 U/L (ref 15–41)
Albumin: 4 g/dL (ref 3.5–5.0)
Alkaline Phosphatase: 56 U/L (ref 38–126)
Anion gap: 12 (ref 5–15)
BUN: 14 mg/dL (ref 8–23)
CO2: 22 mmol/L (ref 22–32)
Calcium: 9.3 mg/dL (ref 8.9–10.3)
Chloride: 99 mmol/L (ref 98–111)
Creatinine, Ser: 0.88 mg/dL (ref 0.44–1.00)
GFR, Estimated: >60 mL/min (ref >60)
Glucose, Bld: 179 mg/dL — ABNORMAL HIGH (ref 70–99)
Potassium: 3.2 mmol/L — ABNORMAL LOW (ref 3.5–5.1)
Sodium: 133 mmol/L — ABNORMAL LOW (ref 135–145)
Total Bilirubin: 0.4 mg/dL (ref 0.3–1.2)
Total Protein: 6.8 g/dL (ref 6.5–8.1)

## 2022-10-18 MED ORDER — DIPHENHYDRAMINE HCL 50 MG/ML IJ SOLN
12.5000 mg | Freq: Once | INTRAMUSCULAR | Status: AC
  Administered 2022-10-18: 12.5 mg via INTRAVENOUS
  Filled 2022-10-18: qty 1

## 2022-10-18 MED ORDER — OXYCODONE-ACETAMINOPHEN 5-325 MG PO TABS
1.0000 | ORAL_TABLET | Freq: Once | ORAL | Status: AC
  Administered 2022-10-18: 1 via ORAL
  Filled 2022-10-18: qty 1

## 2022-10-18 MED ORDER — ONDANSETRON HCL 4 MG/2ML IJ SOLN: 4.0000 mg | Freq: Once | INTRAMUSCULAR | Status: AC

## 2022-10-18 MED ORDER — ONDANSETRON HCL 4 MG/2ML IJ SOLN
INTRAMUSCULAR | Status: AC
  Administered 2022-10-18: 4 mg via INTRAVENOUS
  Filled 2022-10-18: qty 2

## 2022-10-18 MED ORDER — METOCLOPRAMIDE HCL 5 MG/ML IJ SOLN
2.5000 mg | Freq: Once | INTRAMUSCULAR | Status: AC
  Administered 2022-10-18: 2.5 mg via INTRAVENOUS
  Filled 2022-10-18: qty 2

## 2022-10-18 MED ORDER — LORAZEPAM 2 MG/ML IJ SOLN
0.5000 mg | Freq: Once | INTRAMUSCULAR | Status: AC
  Administered 2022-10-18: 0.5 mg via INTRAVENOUS
  Filled 2022-10-18: qty 1

## 2022-10-18 MED ORDER — OXYCODONE-ACETAMINOPHEN 5-325 MG PO TABS: 1.0000 | ORAL_TABLET | Freq: Four times a day (QID) | ORAL | 0 refills | Status: DC | PRN

## 2022-10-18 NOTE — ED Notes (Signed)
Pt's skin tears cleansed with normal saline, then covered with xeroform, gauze and tegaderm. R FA, L FA and L elbow

## 2022-10-18 NOTE — ED Triage Notes (Signed)
Pt brought in by EMS; fell down 2-3 concrete steps, backwards. Denies LOC/blood thinners, presented in c-collar. +hematoma to back of head. L wrist pain, several skin tears to bilateral arms. '4mg'$  IVP zofran given en route d/t N/V post fall.

## 2022-10-18 NOTE — ED Notes (Signed)
Pt to CT/xray accompanied by RN

## 2022-10-18 NOTE — Progress Notes (Signed)
Orthopedic Tech Progress Note Patient Details:  Michelle Zavala 06-11-1946 [id number]  Ortho Devices Type of Ortho Device: Volar splint Ortho Device/Splint Location: lue Ortho Device/Splint Interventions: Ordered, Application, Adjustment   Post Interventions Patient Tolerated: Well Instructions Provided: Care of device, Adjustment of device  [name], [name] 10/18/2022, 10:22 PM

## 2022-10-18 NOTE — ED Provider Notes (Signed)
[facility] Provider Note   CSN: [id number] Arrival date & time: 10/18/22  1502     History  Chief Complaint  Patient presents with   [name]    Michelle Zavala is a 77 y.o. female.  77 year old female presents after mechanical fall just prior to arrival.  Patient states that she slipped down 2-3 concrete steps.  [address] and struck her head.  Did not lose consciousness.  Does not take any blood thinners.  Complains of pain to her head and neck, also notes discomfort to her right shoulder, lower lumbar spine, right hip.  Also notes discomfort to bilateral wrists.  Patient did have some nausea and was given 4 mg of Zofran via EMS.  Denies any new weakness at this time.       Home Medications Prior to Admission medications   Medication Sig Start Date End Date Taking? Authorizing Provider  acetaminophen (TYLENOL) 650 MG CR tablet Take 1,300 mg by mouth every 8 (eight) hours as needed for pain.     [provider]  acetaminophen-codeine (TYLENOL #3) 300-30 MG tablet Take 1 tablet by mouth every 6 (six) hours as needed for moderate pain. 08/18/22   Carole Civil, MD  albuterol (VENTOLIN HFA) 108 (90 Base) MCG/ACT inhaler Inhale 2 puffs into the lungs every 4 (four) hours as needed for wheezing or shortness of breath.    [provider]  amitriptyline (ELAVIL) 75 MG tablet Take 75 mg by mouth at bedtime. 01/27/15   [provider]  Cyanocobalamin (VITAMIN B-12) 5000 MCG TBDP Take 1 tablet by mouth daily.    [provider]  hydrochlorothiazide (HYDRODIURIL) 25 MG tablet TAKE ONE TABLET BY MOUTH EVERY MORNING 08/23/22   Satira Sark, MD  ipratropium-albuterol (DUONEB) 0.5-2.5 (3) MG/3ML SOLN Take 3 mLs by nebulization every 6 (six) hours as needed for up to 60 doses. 09/15/22   Sherrye Payor A, PA-C  methocarbamol (ROBAXIN) 500 MG tablet Take 1 tablet (500 mg total) by mouth 3 (three) times daily. 09/08/22    Carole Civil, MD  metoprolol succinate (TOPROL-XL) 50 MG 24 hr tablet TAKE 1 AND 1/2 TABLETS BY MOUTH DAILY WITH OR imediately following a MEAL. 10/06/22   Satira Sark, MD  pantoprazole (PROTONIX) 40 MG tablet Take 40 mg by mouth 2 (two) times daily. 07/30/22   [provider]  potassium chloride SA (KLOR-CON M) 20 MEQ tablet Take 2 tablets (40 mEq total) by mouth daily. 09/15/21   Strader, Fransisco Hertz, PA-C  sertraline (ZOLOFT) 100 MG tablet Take 100 mg by mouth daily.    [provider]      Allergies    Sulfonamide derivatives, Atorvastatin, Crestor [rosuvastatin], Entresto [sacubitril-valsartan], and Farxiga [dapagliflozin]    Review of Systems   Review of Systems  All other systems reviewed and are negative.   Physical Exam Updated Vital Signs BP (!) 176/108   Pulse 93   Temp 98.3 F (36.8 C) (Oral)   Resp 17   Ht 1.6 m ('5\' 3"'$ )   Wt 75.8 kg   SpO2 98%   BMI 29.58 kg/m  Physical Exam Vitals and nursing note reviewed.  Constitutional:      General: She is not in acute distress.    Appearance: Normal appearance. She is well-developed. She is not toxic-appearing.  HENT:     Head: Normocephalic and atraumatic.   Eyes:     General: Lids are normal.  Conjunctiva/sclera: Conjunctivae normal.     Pupils: Pupils are equal, round, and reactive to light.  Neck:     Thyroid: No thyroid mass.     Trachea: No tracheal deviation.  Cardiovascular:     Rate and Rhythm: Normal rate and regular rhythm.     Heart sounds: Normal heart sounds. No murmur heard.    No gallop.  Pulmonary:     Effort: Pulmonary effort is normal. No respiratory distress.     Breath sounds: Normal breath sounds. No stridor. No decreased breath sounds, wheezing, rhonchi or rales.  Abdominal:     General: There is no distension.     Palpations: Abdomen is soft.     Tenderness: There is no abdominal tenderness. There is no rebound.  Musculoskeletal:        General: No  tenderness. Normal range of motion.       Arms:     Cervical back: Normal range of motion and neck supple.       Legs:  Skin:    General: Skin is warm and dry.     Findings: No abrasion or rash.     Comments: Skin tears noted to bilateral upper extremities.  No suturable wounds appreciated.  Neurological:     Mental Status: She is alert and oriented to person, place, and time. Mental status is at baseline.     GCS: GCS eye subscore is 4. GCS verbal subscore is 5. GCS motor subscore is 6.     Cranial Nerves: No cranial nerve deficit.     Sensory: No sensory deficit.     Motor: Motor function is intact.     Comments: Strength is 5/5 in upper as well as lower extremities.  Psychiatric:        Attention and Perception: Attention normal.        Speech: Speech normal.        Behavior: Behavior normal.     ED Results / Procedures / Treatments   Labs (all labs ordered are listed, but only abnormal results are displayed) Labs Reviewed - No data to display  EKG None  Radiology No results found.  Procedures Procedures    Medications Ordered in ED Medications  oxyCODONE-acetaminophen (PERCOCET/ROXICET) 5-325 MG per tablet 1 tablet (has no administration in time range)    ED Course/ Medical Decision Making/ A&P                             Medical Decision Making Amount and/or Complexity of Data Reviewed Labs: ordered. Radiology: ordered.  Risk Prescription drug management.   Patient here after mechanical fall just prior to arrival.  Patient had multiple imagings including CT of head and cervical spine which per my interpretation did not show any acute findings.  Patient did have an x-ray of her left wrist which per my review interpretation showed impacted distal radius fracture.  Patient's x-ray of her right forearm showed a new fracture of her midshaft ulna from a prior healed fracture.  Patient neurovasc intact at both sites.  Splint applied by orthopedics.  Did speak  with Dr. Lorin Mercy, patient's orthopedist, who will see the patient in follow-up.  Patient is neurological assessment was done afterwards and she is neurologically intact.  Will discharge home and she will follow-up with Dr. Lorin Mercy.        Final Clinical Impression(s) / ED Diagnoses Final diagnoses:  None    Rx / DC Orders  ED Discharge Orders     None         Lacretia Leigh, MD 10/18/22 2151

## 2022-10-18 NOTE — ED Notes (Signed)
RN tried to lean patient back and upon moving the head of the bed down from a roughly 70* angle down to about a 45* angle pt began to vomit. MD Zenia Resides notified

## 2022-10-18 NOTE — ED Notes (Signed)
RN notified that patient was vomiting at xray after moving from the stretcher to the exam table

## 2022-10-18 NOTE — Progress Notes (Signed)
Orthopedic Tech Progress Note Patient Details:  Michelle Zavala 1946-01-10 [id number]  Ortho Devices Type of Ortho Device: [place]artong splint, Arm sling Ortho Device/Splint Location: rue Ortho Device/Splint Interventions: Ordered, Application, Adjustment  As I was applying the splint the patient was moving their arm around. Post Interventions Patient Tolerated: Well Instructions Provided: Care of device, Adjustment of device  Michelle Zavala, [place] 10/18/2022, 10:41 PM

## 2022-10-19 DIAGNOSIS — N390 Urinary tract infection, site not specified: Secondary | ICD-10-CM | POA: Diagnosis not present

## 2022-10-20 DIAGNOSIS — S335XXD Sprain of ligaments of lumbar spine, subsequent encounter: Secondary | ICD-10-CM | POA: Diagnosis not present

## 2022-10-20 DIAGNOSIS — F03B3 Unspecified dementia, moderate, with mood disturbance: Secondary | ICD-10-CM | POA: Diagnosis not present

## 2022-10-20 DIAGNOSIS — M129 Arthropathy, unspecified: Secondary | ICD-10-CM | POA: Diagnosis not present

## 2022-10-20 DIAGNOSIS — M1990 Unspecified osteoarthritis, unspecified site: Secondary | ICD-10-CM | POA: Diagnosis not present

## 2022-10-20 DIAGNOSIS — Z9181 History of falling: Secondary | ICD-10-CM | POA: Diagnosis not present

## 2022-10-20 DIAGNOSIS — G5601 Carpal tunnel syndrome, right upper limb: Secondary | ICD-10-CM | POA: Diagnosis not present

## 2022-10-20 DIAGNOSIS — M810 Age-related osteoporosis without current pathological fracture: Secondary | ICD-10-CM | POA: Diagnosis not present

## 2022-10-21 ENCOUNTER — Encounter: Payer: Self-pay | Admitting: Orthopaedic Surgery

## 2022-10-21 ENCOUNTER — Ambulatory Visit (INDEPENDENT_AMBULATORY_CARE_PROVIDER_SITE_OTHER): Payer: 59 | Admitting: Orthopaedic Surgery

## 2022-10-21 VITALS — Ht 63.0 in | Wt 167.0 lb

## 2022-10-21 DIAGNOSIS — S52209K Unspecified fracture of shaft of unspecified ulna, subsequent encounter for closed fracture with nonunion: Secondary | ICD-10-CM | POA: Insufficient documentation

## 2022-10-21 DIAGNOSIS — S52532A Colles' fracture of left radius, initial encounter for closed fracture: Secondary | ICD-10-CM

## 2022-10-21 DIAGNOSIS — S52231K Displaced oblique fracture of shaft of right ulna, subsequent encounter for closed fracture with nonunion: Secondary | ICD-10-CM | POA: Diagnosis not present

## 2022-10-21 MED ORDER — OXYCODONE-ACETAMINOPHEN 5-325 MG PO TABS: 1.0000 | ORAL_TABLET | ORAL | 0 refills | Status: DC | PRN
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
## 2022-10-22 ENCOUNTER — Telehealth: Payer: Self-pay | Admitting: Orthopaedic Surgery

## 2022-10-22 NOTE — Telephone Encounter (Signed)
Patient called and advised that to disregard last medication request family advised patient to eat prior to taking medication and she is ok with it.

## 2022-10-22 NOTE — Telephone Encounter (Signed)
Please advise 

## 2022-10-22 NOTE — Telephone Encounter (Signed)
Patient asking if dr. Lorin Mercy can write a rx for pain medication  the medication given yesterday is making her sick. She want Hydrocodone.please advise.

## 2022-10-25 DIAGNOSIS — J449 Chronic obstructive pulmonary disease, unspecified: Secondary | ICD-10-CM | POA: Diagnosis not present

## 2022-10-25 DIAGNOSIS — M6281 Muscle weakness (generalized): Secondary | ICD-10-CM | POA: Diagnosis not present

## 2022-10-25 DIAGNOSIS — R2689 Other abnormalities of gait and mobility: Secondary | ICD-10-CM | POA: Diagnosis not present

## 2022-10-25 DIAGNOSIS — S72012D Unspecified intracapsular fracture of left femur, subsequent encounter for closed fracture with routine healing: Secondary | ICD-10-CM | POA: Diagnosis not present

## 2022-10-25 DIAGNOSIS — R42 Dizziness and giddiness: Secondary | ICD-10-CM | POA: Diagnosis not present

## 2022-10-26 ENCOUNTER — Ambulatory Visit: Payer: 59 | Admitting: Gastroenterology

## 2022-10-27 DIAGNOSIS — S72059A Unspecified fracture of head of unspecified femur, initial encounter for closed fracture: Secondary | ICD-10-CM | POA: Diagnosis not present

## 2022-10-27 DIAGNOSIS — I10 Essential (primary) hypertension: Secondary | ICD-10-CM | POA: Diagnosis not present

## 2022-10-27 DIAGNOSIS — Z299 Encounter for prophylactic measures, unspecified: Secondary | ICD-10-CM | POA: Diagnosis not present

## 2022-10-27 DIAGNOSIS — M109 Gout, unspecified: Secondary | ICD-10-CM | POA: Diagnosis not present

## 2022-10-27 DIAGNOSIS — S62102A Fracture of unspecified carpal bone, left wrist, initial encounter for closed fracture: Secondary | ICD-10-CM | POA: Diagnosis not present

## 2022-10-28 ENCOUNTER — Telehealth: Payer: Self-pay

## 2022-10-28 NOTE — Telephone Encounter (Signed)
        Patient  visited [facility] on 10/18/2022  for fall.   Telephone encounter attempt :  1st  A HIPAA compliant voice message was left requesting a return call.  Instructed patient to call back at [phone].   [place] Resource Care Guide   [name]  ?? [phone]   Website: [email or web address]  [email or web address]

## 2022-10-29 ENCOUNTER — Telehealth: Payer: Self-pay

## 2022-10-29 DIAGNOSIS — M1990 Unspecified osteoarthritis, unspecified site: Secondary | ICD-10-CM | POA: Diagnosis not present

## 2022-10-29 DIAGNOSIS — S335XXD Sprain of ligaments of lumbar spine, subsequent encounter: Secondary | ICD-10-CM | POA: Diagnosis not present

## 2022-10-29 DIAGNOSIS — M810 Age-related osteoporosis without current pathological fracture: Secondary | ICD-10-CM | POA: Diagnosis not present

## 2022-10-29 DIAGNOSIS — Z9181 History of falling: Secondary | ICD-10-CM | POA: Diagnosis not present

## 2022-10-29 DIAGNOSIS — F03B3 Unspecified dementia, moderate, with mood disturbance: Secondary | ICD-10-CM | POA: Diagnosis not present

## 2022-10-29 NOTE — Telephone Encounter (Signed)
        Patient  visited [facility] [place] on 10/18/2022  for fall.   Telephone encounter attempt :  2nd  A HIPAA compliant voice message was left requesting a return call.  Instructed patient to call back at [phone].   [place] Resource Care Guide   [email or web address]  ?? [phone]   Website: [email or web address]  [email or web address]

## 2022-11-02 ENCOUNTER — Telehealth: Payer: Self-pay

## 2022-11-02 NOTE — Telephone Encounter (Signed)
        Patient  visited [facility] [place] on 10/18/2022  for fall.   Telephone encounter attempt :  3rd  A HIPAA compliant voice message was left requesting a return call.  Instructed patient to call back at [phone].   [place] Resource Care Guide   [email or web address]  ?? [phone]   Website: [email or web address]  [email or web address]

## 2022-11-03 ENCOUNTER — Ambulatory Visit: Payer: 59 | Admitting: Gastroenterology

## 2022-11-04 ENCOUNTER — Ambulatory Visit (INDEPENDENT_AMBULATORY_CARE_PROVIDER_SITE_OTHER): Payer: 59 | Admitting: Orthopaedic Surgery

## 2022-11-04 ENCOUNTER — Encounter: Payer: Self-pay | Admitting: Orthopaedic Surgery

## 2022-11-04 ENCOUNTER — Ambulatory Visit: Payer: Self-pay

## 2022-11-04 VITALS — Ht 63.0 in | Wt 167.0 lb

## 2022-11-04 DIAGNOSIS — S52532A Colles' fracture of left radius, initial encounter for closed fracture: Secondary | ICD-10-CM

## 2022-11-04 MED ORDER — HYDROCODONE-ACETAMINOPHEN 5-325 MG PO TABS: 1.0000 | ORAL_TABLET | Freq: Four times a day (QID) | ORAL | 0 refills | Status: DC | PRN
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
## 2022-11-09 ENCOUNTER — Emergency Department (HOSPITAL_COMMUNITY)
Admission: EM | Admit: 2022-11-09 | Discharge: 2022-11-09 | Disposition: A | Discharge status: Home / Self Care | Payer: 59 | Attending: Emergency Medicine | Admitting: Emergency Medicine

## 2022-11-09 ENCOUNTER — Other Ambulatory Visit: Payer: Self-pay

## 2022-11-09 DIAGNOSIS — Z79899 Other long term (current) drug therapy: Secondary | ICD-10-CM | POA: Diagnosis not present

## 2022-11-09 DIAGNOSIS — R42 Dizziness and giddiness: Secondary | ICD-10-CM | POA: Insufficient documentation

## 2022-11-09 DIAGNOSIS — I951 Orthostatic hypotension: Secondary | ICD-10-CM | POA: Diagnosis not present

## 2022-11-09 DIAGNOSIS — I129 Hypertensive chronic kidney disease with stage 1 through stage 4 chronic kidney disease, or unspecified chronic kidney disease: Secondary | ICD-10-CM | POA: Diagnosis not present

## 2022-11-09 DIAGNOSIS — N189 Chronic kidney disease, unspecified: Secondary | ICD-10-CM | POA: Insufficient documentation

## 2022-11-09 DIAGNOSIS — I2510 Atherosclerotic heart disease of native coronary artery without angina pectoris: Secondary | ICD-10-CM | POA: Insufficient documentation

## 2022-11-09 DIAGNOSIS — E876 Hypokalemia: Secondary | ICD-10-CM

## 2022-11-09 DIAGNOSIS — N3090 Cystitis, unspecified without hematuria: Secondary | ICD-10-CM

## 2022-11-09 DIAGNOSIS — J449 Chronic obstructive pulmonary disease, unspecified: Secondary | ICD-10-CM | POA: Diagnosis not present

## 2022-11-09 LAB — URINALYSIS, ROUTINE W REFLEX MICROSCOPIC
Bilirubin Urine: NEGATIVE
Glucose, UA: NEGATIVE mg/dL
Hgb urine dipstick: NEGATIVE
Ketones, ur: NEGATIVE mg/dL
Nitrite: NEGATIVE
Protein, ur: NEGATIVE mg/dL
Specific Gravity, Urine: 1.006 (ref 1.005–1.030)
WBC, UA: >50 WBC/hpf (ref 0–5)
pH: 6 (ref 5.0–8.0)

## 2022-11-09 LAB — CBC
HCT: 33.6 % — ABNORMAL LOW (ref 36.0–46.0)
Hemoglobin: 11.5 g/dL — ABNORMAL LOW (ref 12.0–15.0)
MCH: 29.4 pg (ref 26.0–34.0)
MCHC: 34.2 g/dL (ref 30.0–36.0)
MCV: 85.9 fL (ref 80.0–100.0)
Platelets: 267 10*3/uL (ref 150–400)
RBC: 3.91 MIL/uL (ref 3.87–5.11)
RDW: 13.2 % (ref 11.5–15.5)
WBC: 6.9 10*3/uL (ref 4.0–10.5)
nRBC: 0 % (ref 0.0–0.2)

## 2022-11-09 LAB — BASIC METABOLIC PANEL
Anion gap: 10 (ref 5–15)
BUN: 12 mg/dL (ref 8–23)
CO2: 25 mmol/L (ref 22–32)
Calcium: 9.2 mg/dL (ref 8.9–10.3)
Chloride: 95 mmol/L — ABNORMAL LOW (ref 98–111)
Creatinine, Ser: 0.79 mg/dL (ref 0.44–1.00)
GFR, Estimated: >60 mL/min (ref >60)
Glucose, Bld: 131 mg/dL — ABNORMAL HIGH (ref 70–99)
Potassium: 3.1 mmol/L — ABNORMAL LOW (ref 3.5–5.1)
Sodium: 130 mmol/L — ABNORMAL LOW (ref 135–145)

## 2022-11-09 LAB — MAGNESIUM: Magnesium: 1.4 mg/dL — ABNORMAL LOW (ref 1.7–2.4)

## 2022-11-09 MED ORDER — ACETAMINOPHEN 325 MG PO TABS
650.0000 mg | ORAL_TABLET | Freq: Once | ORAL | Status: AC
  Administered 2022-11-09: 650 mg via ORAL
  Filled 2022-11-09: qty 2

## 2022-11-09 MED ORDER — POTASSIUM CHLORIDE CRYS ER 20 MEQ PO TBCR
40.0000 meq | EXTENDED_RELEASE_TABLET | Freq: Once | ORAL | Status: AC
  Administered 2022-11-09: 40 meq via ORAL
  Filled 2022-11-09: qty 2

## 2022-11-09 MED ORDER — MAGNESIUM SULFATE 2 GM/50ML IV SOLN
2.0000 g | Freq: Once | INTRAVENOUS | Status: AC
  Administered 2022-11-09: 2 g via INTRAVENOUS
  Filled 2022-11-09: qty 50

## 2022-11-09 MED ORDER — LACTATED RINGERS IV BOLUS
500.0000 mL | Freq: Once | INTRAVENOUS | Status: AC
  Administered 2022-11-09: 500 mL via INTRAVENOUS

## 2022-11-09 MED ORDER — CEPHALEXIN 500 MG PO CAPS: 500.0000 mg | ORAL_CAPSULE | Freq: Four times a day (QID) | ORAL | 0 refills | Status: DC

## 2022-11-09 MED ORDER — LACTATED RINGERS IV BOLUS: 1000.0000 mL | Freq: Once | INTRAVENOUS | Status: DC

## 2022-11-09 MED ORDER — SODIUM CHLORIDE 0.9 % IV SOLN
1.0000 g | Freq: Once | INTRAVENOUS | Status: AC
  Administered 2022-11-09: 1 g via INTRAVENOUS
  Filled 2022-11-09: qty 10

## 2022-11-09 NOTE — ED Provider Notes (Signed)
[name] Note   CSN: [id number] Arrival date & time: 11/09/22  [id number]     History  Chief Complaint  Patient presents with   Dizziness   Leg Pain    Michelle Zavala is a 77 y.o. female.  HPI Patient presents for dizziness and lightheadedness.  Medical history includes HLD, HTN, CAD, PSVT, carotid artery disease, vertebral artery pseudoaneurysm, obesity, CKD, GERD, COPD, depression, anxiety, arthritis.  3 weeks ago, she had a fall.  During this fall, she did strike her head.  She was evaluated in the emergency department.  CT imaging of head and cervical spine were negative at that time.  She was found to have a distal left radial fracture.  She was splinted and did follow-up with orthopedic surgery.  She now has a cast on her distal left arm.  She has had ongoing right shoulder pain since the fall.  Right shoulder x-ray at time of fall showed no acute findings.  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  Over the past several weeks, she has experienced dizziness and lightheadedness, worse in the mornings when she gets up out of bed.  She denies any worsening symptoms today.  She presents to the ED due to the ongoing daily symptoms.  She does take blood pressure medications.  She does take HCTZ.  In addition to her symptoms of dizziness that brought her to the ED, she also has had right groin pain over the last 4 days.  This is worse with hip flexion.    Home Medications Prior to Admission medications   Medication Sig Start Date End Date Taking? Authorizing Provider  acetaminophen (TYLENOL) 650 MG CR tablet Take 1,300 mg by mouth every 8 (eight) hours as needed for pain.    Yes [provider]  albuterol (VENTOLIN HFA) 108 (90 Base) MCG/ACT inhaler Inhale 2 puffs into the lungs every 4 (four) hours as needed for wheezing or shortness of breath.   Yes [provider]  amitriptyline (ELAVIL) 75 MG tablet Take 75 mg by mouth at bedtime. 01/27/15  Yes [provider]  cephALEXin (KEFLEX) 500 MG capsule Take 1 capsule (500 mg total) by mouth 4 (four) times daily. 11/09/22  Yes Godfrey Pick, MD  colchicine 0.6 MG tablet Take 0.6 mg by mouth daily. 10/27/22  Yes [provider]  Cyanocobalamin (VITAMIN B-12) 5000 MCG TBDP Take 1 tablet by mouth daily.   Yes [provider]  hydrochlorothiazide (HYDRODIURIL) 25 MG tablet TAKE ONE TABLET BY MOUTH EVERY MORNING 08/23/22  Yes Satira Sark, MD  HYDROcodone-acetaminophen (NORCO/VICODIN) 5-325 MG tablet Take 1 tablet by mouth every 6 (six) hours as needed for moderate pain. 11/04/22  Yes Marybelle Killings, MD  ipratropium-albuterol (DUONEB) 0.5-2.5 (3) MG/3ML SOLN Take 3 mLs by nebulization every 6 (six) hours as needed for up to 60 doses. 09/15/22  Yes Beatty, Celeste A, PA-C  levocetirizine (XYZAL) 5 MG tablet Take 5 mg by mouth at bedtime. 11/01/22  Yes [provider]  LORazepam (ATIVAN) 1 MG tablet Take 1 mg by mouth 2 (two) times daily. 10/22/22  Yes [provider]  meclizine (ANTIVERT) 25 MG tablet Take 25 mg by mouth 3 (three) times daily. 10/19/22  Yes [provider]  metoprolol succinate (TOPROL-XL) 50 MG 24 hr tablet TAKE 1 AND 1/2 TABLETS BY MOUTH DAILY WITH OR imediately following a MEAL. 10/06/22  Yes  Satira Sark, MD  ondansetron (ZOFRAN) 4 MG tablet Take 4 mg by mouth every 8 (eight) hours as needed for nausea. 10/25/22  Yes [provider]  pantoprazole (PROTONIX) 40 MG tablet Take 40 mg by mouth 2 (two) times daily. 07/30/22  Yes [provider]  potassium chloride SA (KLOR-CON M) 20 MEQ tablet Take 2 tablets (40 mEq total) by mouth daily. 09/15/21  Yes Strader, [place] M, PA-C  sertraline (ZOLOFT) 100 MG tablet Take 100 mg by mouth daily.   Yes [provider]  methocarbamol (ROBAXIN) 500 MG tablet Take 1 tablet (500  mg total) by mouth 3 (three) times daily. Patient not taking: Reported on 11/09/2022 09/08/22   Carole Civil, MD      Allergies    Sulfonamide derivatives, Atorvastatin, Crestor [rosuvastatin], Entresto [sacubitril-valsartan], and Farxiga [dapagliflozin]    Review of Systems   Review of Systems  Musculoskeletal:  Positive for arthralgias.  Neurological:  Positive for dizziness and light-headedness.  All other systems reviewed and are negative.   Physical Exam Updated Vital Signs BP (!) 171/86   Pulse (!) 56   Temp 98 F (36.7 C) (Oral)   Resp 18   Ht '5\' 3"'$  (1.6 m)   Wt 75.8 kg   SpO2 94%   BMI 29.58 kg/m  Physical Exam Vitals and nursing note reviewed.  Constitutional:      General: She is not in acute distress.    Appearance: Normal appearance. She is well-developed. She is not ill-appearing, toxic-appearing or diaphoretic.  HENT:     Head: Normocephalic and atraumatic.     Right Ear: External ear normal.     Left Ear: External ear normal.     Nose: Nose normal.     Mouth/Throat:     Mouth: Mucous membranes are moist.  Eyes:     Extraocular Movements: Extraocular movements intact.     Conjunctiva/sclera: Conjunctivae normal.  Cardiovascular:     Rate and Rhythm: Normal rate and regular rhythm.     Heart sounds: No murmur heard. Pulmonary:     Effort: Pulmonary effort is normal. No respiratory distress.     Breath sounds: Normal breath sounds. No wheezing or rales.  Abdominal:     General: There is no distension.     Palpations: Abdomen is soft.     Tenderness: There is no abdominal tenderness.  Musculoskeletal:        General: No swelling or deformity.     Cervical back: Normal range of motion and neck supple.     Right lower leg: No edema.     Left lower leg: No edema.     Comments: Cast in place on distal left arm.  Right shoulder flexion and abduction limited to 90 degrees.  Skin:    General: Skin is warm and dry.     Coloration: Skin is not  jaundiced or pale.  Neurological:     General: No focal deficit present.     Mental Status: She is alert and oriented to person, place, and time.     Cranial Nerves: No cranial nerve deficit.     Sensory: No sensory deficit.     Motor: No weakness.     Coordination: Coordination normal.  Psychiatric:        Mood and Affect: Mood normal.        Behavior: Behavior normal.        Thought Content: Thought content normal.  Judgment: Judgment normal.     ED Results / Procedures / Treatments   Labs (all labs ordered are listed, but only abnormal results are displayed) Labs Reviewed  BASIC METABOLIC PANEL - Abnormal; Notable for the following components:      Result Value   Sodium 130 (*)    Potassium 3.1 (*)    Chloride 95 (*)    Glucose, Bld 131 (*)    All other components within normal limits  MAGNESIUM - Abnormal; Notable for the following components:   Magnesium 1.4 (*)    All other components within normal limits  CBC - Abnormal; Notable for the following components:   Hemoglobin 11.5 (*)    HCT 33.6 (*)    All other components within normal limits  URINALYSIS, ROUTINE W REFLEX MICROSCOPIC - Abnormal; Notable for the following components:   APPearance CLOUDY (*)    Leukocytes,Ua LARGE (*)    Bacteria, UA FEW (*)    Non Squamous Epithelial 0-5 (*)    All other components within normal limits    EKG EKG Interpretation  Date/Time:  Tuesday November 09 2022 07:59:26 EST Ventricular Rate:  80 PR Interval:  230 QRS Duration: 88 QT Interval:  496 QTC Calculation: 573 R Axis:   -47 Text Interpretation: Sinus rhythm Prolonged PR interval Left anterior fascicular block Abnormal R-wave progression, late transition Borderline T abnormalities, anterior leads Prolonged QT interval Confirmed by Godfrey Pick ([id number]) on 11/09/2022 9:18:13 AM  Radiology No results found.  Procedures Procedures    Medications Ordered in ED Medications  cefTRIAXone (ROCEPHIN) 1 g in sodium  chloride 0.9 % 100 mL IVPB (1 g Intravenous New Bag/Given 11/09/22 1102)  acetaminophen (TYLENOL) tablet 650 mg (650 mg Oral Given 11/09/22 0825)  lactated ringers bolus 500 mL (0 mLs Intravenous Stopped 11/09/22 0907)  magnesium sulfate IVPB 2 g 50 mL (0 g Intravenous Stopped 11/09/22 1106)  potassium chloride SA (KLOR-CON M) CR tablet 40 mEq (40 mEq Oral Given 11/09/22 [id number])    ED Course/ Medical Decision Making/ A&P                             Medical Decision Making Amount and/or Complexity of Data Reviewed Labs: ordered. ECG/medicine tests: ordered.  Risk OTC drugs. Prescription drug management.   This patient presents to the ED for concern of dizziness and lightheadedness, this involves an extensive number of treatment options, and is a complaint that carries with it a high risk of complications and morbidity.  The differential diagnosis includes dehydration, orthostatic hypotension, polypharmacy, postconcussive syndrome, metabolic derangements, anemia   Co morbidities that complicate the patient evaluation  HLD, HTN, CAD, PSVT, carotid artery disease, vertebral artery pseudoaneurysm, obesity, CKD, GERD, COPD, depression, anxiety, arthritis   Additional history obtained:  Additional history obtained from patient's family member External records from outside source obtained and reviewed including EMR   Lab Tests:  I Ordered, and personally interpreted labs.  The pertinent results include: Anemia is baseline.  No leukocytosis is present.  A hypokalemia and hypomagnesemia are present.  Urinalysis is consistent with UTI.  Cardiac Monitoring: / EKG:  The patient was maintained on a cardiac monitor.  I personally viewed and interpreted the cardiac monitored which showed an underlying rhythm of: Sinus rhythm  Problem List / ED Course / Critical interventions / Medication management  Patient is a pleasant 77 year old female presenting to the ED for multiple complaints.  Chief  among these  is morning dizziness when getting up out of bed.  She endorses some right groin pain over the past 4 days.  She denies any new injuries since her fall 3 weeks ago.  On exam, there is no appreciable swelling.  Her pain is worsened with active hip flexion and passive external rotation.  Exam is consistent with adductor muscle inflammation.  Patient was counseled on supportive care.  Tylenol was ordered.  She endorses right shoulder pain which seems to be a chronic condition.  This was probably worsened during her fall 3 weeks ago.  She did undergo imaging at that time.  This imaging showed chronic superior subluxation of humeral head consistent with chronic rotator cuff tear.  On exam, range of motion is intact up to 90 degrees of shoulder flexion and abduction consistent with rotator cuff pathology.  Patient was counseled on this as well.  In terms of her dizziness, patient is normotensive with blood pressures of 120s over 80s at rest.  When standing, blood pressure drops to 90s over 60s.  I suspect that her morning symptoms of dizziness are secondary to orthostatic hypotension in addition to likely ongoing postconcussive syndrome from her fall 3 weeks ago.  IV fluids were ordered.  Will check labs to assess for electrolyte disturbances and/for anemia that may also be contributing.  Of note, patient has no focal neurologic deficits on exam.  She is alert and oriented.  Lab work shows a hypokalemia and hypomagnesemia.  Electrolyte replacements were ordered.  Following IV fluids, patient had improvement in her lightheadedness with standing.  Urinalysis shows evidence of UTI.  Patient was given dose of antibiotics in the ED.  She was prescribed ongoing antibiotics for home.  She was advised to hold off on her HCTZ and speak with her primary care doctor about medication changes that may improve her daily episodes of lightheadedness.  She was discharged in stable condition. I ordered medication including IV  fluids for hydration; potassium chloride for hypokalemia; magnesium sulfate for hypomagnesemia; ceftriaxone for UTI Reevaluation of the patient after these medicines showed that the patient improved I have reviewed the patients home medicines and have made adjustments as needed   Social Determinants of Health:  Has PCP         Final Clinical Impression(s) / ED Diagnoses Final diagnoses:  Dizziness  Orthostatic hypotension  Hypokalemia  Hypomagnesemia  Cystitis    Rx / DC Orders ED Discharge Orders          Ordered    cephALEXin (KEFLEX) 500 MG capsule  4 times daily        11/09/22 1127              Godfrey Pick, MD 11/09/22 1129

## 2022-11-09 NOTE — ED Notes (Signed)
2nd request for urine sample unsuccessful.

## 2022-11-09 NOTE — ED Triage Notes (Signed)
Pt c/o dizziness this morning and right inner leg pain with right shoulder pain x 4 days.

## 2022-11-09 NOTE — Discharge Instructions (Addendum)
Your potassium and magnesium were low today.  You also appear to be dehydrated.  Electrolytes and fluids were replaced in the ER.  Hold off on the HCTZ for the next 2 days and see if this improves your symptoms of dizziness.  Speak with your primary care doctor about medication adjustments to minimize these symptoms.  Additionally, your urinalysis shows infection.  You were given a dose of antibiotics in the emergency room.  A prescription for continued antibiotics was sent to your pharmacy.  Take these as prescribed starting tomorrow.  Your groin pain is likely from strained/inflamed muscles on the inside of your thigh.  Take Tylenol as needed for pain.  Continue movements within your pain tolerance.  This pain should improve with time.  Your right shoulder pain is from chronic damage to the rotator cuff.  Attached is a list of gentle exercises that you can do daily to get some function and range of motion back in that shoulder.  Discuss this further with your orthopedic doctor, who I believe you will see in the next couple weeks for your wrist injury.

## 2022-11-09 NOTE — ED Notes (Signed)
Requested urine sample from patient. Patient reports no urge to urinate.

## 2022-11-09 NOTE — ED Notes (Signed)
EDP at bedside assessing pt.

## 2022-11-17 ENCOUNTER — Telehealth: Payer: Self-pay

## 2022-11-17 DIAGNOSIS — M25519 Pain in unspecified shoulder: Secondary | ICD-10-CM | POA: Diagnosis not present

## 2022-11-17 DIAGNOSIS — M25511 Pain in right shoulder: Secondary | ICD-10-CM | POA: Diagnosis not present

## 2022-11-17 DIAGNOSIS — I10 Essential (primary) hypertension: Secondary | ICD-10-CM | POA: Diagnosis not present

## 2022-11-17 DIAGNOSIS — R42 Dizziness and giddiness: Secondary | ICD-10-CM | POA: Diagnosis not present

## 2022-11-17 DIAGNOSIS — Z299 Encounter for prophylactic measures, unspecified: Secondary | ICD-10-CM | POA: Diagnosis not present

## 2022-11-17 NOTE — Telephone Encounter (Signed)
        Patient  visited [facility] on 2/27    Telephone encounter attempt :  1st  A HIPAA compliant voice message was left requesting a return call.  Instructed patient to call back     [facility] [phone] [address], [place], [place] [address] Phone: [phone] Email: [email or web address]

## 2022-11-18 ENCOUNTER — Other Ambulatory Visit: Payer: Self-pay | Admitting: Orthopaedic Surgery

## 2022-11-18 ENCOUNTER — Telehealth: Payer: Self-pay | Admitting: Radiology

## 2022-11-18 ENCOUNTER — Telehealth: Payer: Self-pay

## 2022-11-18 ENCOUNTER — Encounter (INDEPENDENT_AMBULATORY_CARE_PROVIDER_SITE_OTHER): Payer: 59 | Admitting: Ophthalmology

## 2022-11-18 MED ORDER — HYDROCODONE-ACETAMINOPHEN 5-325 MG PO TABS: 1.0000 | ORAL_TABLET | Freq: Four times a day (QID) | ORAL | 0 refills | Status: DC | PRN

## 2022-11-18 NOTE — Telephone Encounter (Signed)
        Patient  visited [facility] on 2/27     Telephone encounter attempt : 2nd   A HIPAA compliant voice message was left requesting a return call.  Instructed patient to call back    [facility] [phone] [address], [place], [place] [address] Phone: [phone] Email: [email or web address]

## 2022-11-18 NOTE — Telephone Encounter (Signed)
Patient requests refill of pain medication be sent to [facility]. She was last given hydrocodone 5/325 1 po q 6 hours prn #20 on 11/04/2022.  She has colles fracture on left and nonunion right forearm fracture.  Please advise.  FYI-patient has been in ED since last office visit for cystitis, hypotension, and dizziness.

## 2022-11-19 NOTE — Telephone Encounter (Signed)
I left voicemail advising. ?

## 2022-11-23 DIAGNOSIS — J449 Chronic obstructive pulmonary disease, unspecified: Secondary | ICD-10-CM | POA: Diagnosis not present

## 2022-11-23 DIAGNOSIS — R2689 Other abnormalities of gait and mobility: Secondary | ICD-10-CM | POA: Diagnosis not present

## 2022-11-23 DIAGNOSIS — S72012D Unspecified intracapsular fracture of left femur, subsequent encounter for closed fracture with routine healing: Secondary | ICD-10-CM | POA: Diagnosis not present

## 2022-11-23 DIAGNOSIS — R42 Dizziness and giddiness: Secondary | ICD-10-CM | POA: Diagnosis not present

## 2022-11-23 DIAGNOSIS — M6281 Muscle weakness (generalized): Secondary | ICD-10-CM | POA: Diagnosis not present

## 2022-11-24 DIAGNOSIS — R42 Dizziness and giddiness: Secondary | ICD-10-CM | POA: Diagnosis not present

## 2022-11-24 DIAGNOSIS — I6381 Other cerebral infarction due to occlusion or stenosis of small artery: Secondary | ICD-10-CM | POA: Diagnosis not present

## 2022-11-26 DIAGNOSIS — I10 Essential (primary) hypertension: Secondary | ICD-10-CM | POA: Diagnosis not present

## 2022-11-26 DIAGNOSIS — I951 Orthostatic hypotension: Secondary | ICD-10-CM | POA: Diagnosis not present

## 2022-11-26 DIAGNOSIS — S62102A Fracture of unspecified carpal bone, left wrist, initial encounter for closed fracture: Secondary | ICD-10-CM | POA: Diagnosis not present

## 2022-11-26 DIAGNOSIS — Z299 Encounter for prophylactic measures, unspecified: Secondary | ICD-10-CM | POA: Diagnosis not present

## 2022-12-02 ENCOUNTER — Other Ambulatory Visit (INDEPENDENT_AMBULATORY_CARE_PROVIDER_SITE_OTHER): Payer: 59

## 2022-12-02 ENCOUNTER — Ambulatory Visit (INDEPENDENT_AMBULATORY_CARE_PROVIDER_SITE_OTHER): Payer: 59 | Admitting: Orthopaedic Surgery

## 2022-12-02 ENCOUNTER — Encounter: Payer: Self-pay | Admitting: Orthopaedic Surgery

## 2022-12-02 VITALS — Ht 63.0 in | Wt 167.0 lb

## 2022-12-02 DIAGNOSIS — S52532A Colles' fracture of left radius, initial encounter for closed fracture: Secondary | ICD-10-CM

## 2022-12-02 MED ORDER — HYDROCODONE-ACETAMINOPHEN 5-325 MG PO TABS: 1.0000 | ORAL_TABLET | Freq: Four times a day (QID) | ORAL | 0 refills | Status: DC | PRN
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
## 2022-12-03 DIAGNOSIS — I951 Orthostatic hypotension: Secondary | ICD-10-CM | POA: Diagnosis not present

## 2022-12-03 DIAGNOSIS — Z299 Encounter for prophylactic measures, unspecified: Secondary | ICD-10-CM | POA: Diagnosis not present

## 2022-12-03 DIAGNOSIS — I10 Essential (primary) hypertension: Secondary | ICD-10-CM | POA: Diagnosis not present

## 2022-12-03 DIAGNOSIS — N390 Urinary tract infection, site not specified: Secondary | ICD-10-CM | POA: Diagnosis not present

## 2022-12-03 DIAGNOSIS — R3915 Urgency of urination: Secondary | ICD-10-CM | POA: Diagnosis not present

## 2022-12-13 ENCOUNTER — Encounter (INDEPENDENT_AMBULATORY_CARE_PROVIDER_SITE_OTHER): Payer: 59 | Admitting: Ophthalmology

## 2022-12-17 DIAGNOSIS — R42 Dizziness and giddiness: Secondary | ICD-10-CM | POA: Diagnosis not present

## 2022-12-17 DIAGNOSIS — I10 Essential (primary) hypertension: Secondary | ICD-10-CM | POA: Diagnosis not present

## 2022-12-17 DIAGNOSIS — Z299 Encounter for prophylactic measures, unspecified: Secondary | ICD-10-CM | POA: Diagnosis not present

## 2022-12-23 ENCOUNTER — Encounter: Payer: Self-pay | Admitting: Orthopaedic Surgery

## 2022-12-23 ENCOUNTER — Ambulatory Visit (INDEPENDENT_AMBULATORY_CARE_PROVIDER_SITE_OTHER): Payer: 59 | Admitting: Orthopaedic Surgery

## 2022-12-23 VITALS — Ht 63.0 in | Wt 167.0 lb

## 2022-12-23 DIAGNOSIS — M7062 Trochanteric bursitis, left hip: Secondary | ICD-10-CM

## 2022-12-23 MED ORDER — METHYLPREDNISOLONE ACETATE 40 MG/ML IJ SUSP
40.0000 mg | INTRAMUSCULAR | Status: AC | PRN
  Administered 2022-12-23: 40 mg via INTRA_ARTICULAR

## 2022-12-23 MED ORDER — LIDOCAINE HCL 1 % IJ SOLN
1.0000 mL | INTRAMUSCULAR | Status: AC | PRN
  Administered 2022-12-23: 1 mL

## 2022-12-23 MED ORDER — BUPIVACAINE HCL 0.25 % IJ SOLN
2.0000 mL | INTRAMUSCULAR | Status: AC | PRN
  Administered 2022-12-23: 2 mL via INTRA_ARTICULAR
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
## 2022-12-24 ENCOUNTER — Telehealth: Payer: Self-pay | Admitting: Radiology

## 2022-12-24 ENCOUNTER — Other Ambulatory Visit: Payer: Self-pay | Admitting: Orthopaedic Surgery

## 2022-12-24 DIAGNOSIS — J449 Chronic obstructive pulmonary disease, unspecified: Secondary | ICD-10-CM | POA: Diagnosis not present

## 2022-12-24 DIAGNOSIS — S72012D Unspecified intracapsular fracture of left femur, subsequent encounter for closed fracture with routine healing: Secondary | ICD-10-CM | POA: Diagnosis not present

## 2022-12-24 DIAGNOSIS — R42 Dizziness and giddiness: Secondary | ICD-10-CM | POA: Diagnosis not present

## 2022-12-24 DIAGNOSIS — R2689 Other abnormalities of gait and mobility: Secondary | ICD-10-CM | POA: Diagnosis not present

## 2022-12-24 DIAGNOSIS — M6281 Muscle weakness (generalized): Secondary | ICD-10-CM | POA: Diagnosis not present

## 2022-12-24 MED ORDER — HYDROCODONE-ACETAMINOPHEN 5-325 MG PO TABS: 1.0000 | ORAL_TABLET | Freq: Four times a day (QID) | ORAL | 0 refills | Status: DC | PRN

## 2022-12-24 NOTE — Telephone Encounter (Signed)
I left voicemail advising. ?

## 2022-12-24 NOTE — Telephone Encounter (Signed)
Received fax from [facility]. Patient is requesting refill on Hydrocodone 5/325 1 po q 6 hours prn #15.  Last filled 12/02/2022.  Please advise.

## 2022-12-27 ENCOUNTER — Other Ambulatory Visit: Payer: Self-pay

## 2022-12-27 ENCOUNTER — Encounter (HOSPITAL_COMMUNITY): Payer: Self-pay | Admitting: Emergency Medicine

## 2022-12-27 ENCOUNTER — Emergency Department (HOSPITAL_COMMUNITY)
Admission: EM | Admit: 2022-12-27 | Discharge: 2022-12-27 | Disposition: A | Discharge status: Home / Self Care | Payer: 59 | Attending: Emergency Medicine | Admitting: Emergency Medicine

## 2022-12-27 ENCOUNTER — Emergency Department (HOSPITAL_COMMUNITY): Payer: 59

## 2022-12-27 DIAGNOSIS — Z20822 Contact with and (suspected) exposure to covid-19: Secondary | ICD-10-CM | POA: Insufficient documentation

## 2022-12-27 DIAGNOSIS — J449 Chronic obstructive pulmonary disease, unspecified: Secondary | ICD-10-CM | POA: Insufficient documentation

## 2022-12-27 DIAGNOSIS — Z79899 Other long term (current) drug therapy: Secondary | ICD-10-CM | POA: Insufficient documentation

## 2022-12-27 DIAGNOSIS — I2510 Atherosclerotic heart disease of native coronary artery without angina pectoris: Secondary | ICD-10-CM | POA: Insufficient documentation

## 2022-12-27 DIAGNOSIS — J9811 Atelectasis: Secondary | ICD-10-CM | POA: Diagnosis not present

## 2022-12-27 DIAGNOSIS — R0789 Other chest pain: Secondary | ICD-10-CM | POA: Diagnosis not present

## 2022-12-27 DIAGNOSIS — I10 Essential (primary) hypertension: Secondary | ICD-10-CM | POA: Diagnosis not present

## 2022-12-27 DIAGNOSIS — Z7951 Long term (current) use of inhaled steroids: Secondary | ICD-10-CM | POA: Diagnosis not present

## 2022-12-27 DIAGNOSIS — R079 Chest pain, unspecified: Secondary | ICD-10-CM | POA: Diagnosis not present

## 2022-12-27 DIAGNOSIS — R0602 Shortness of breath: Secondary | ICD-10-CM | POA: Diagnosis not present

## 2022-12-27 DIAGNOSIS — R059 Cough, unspecified: Secondary | ICD-10-CM | POA: Diagnosis not present

## 2022-12-27 LAB — CBC
HCT: 36.7 % (ref 36.0–46.0)
Hemoglobin: 12.4 g/dL (ref 12.0–15.0)
MCH: 29.8 pg (ref 26.0–34.0)
MCHC: 33.8 g/dL (ref 30.0–36.0)
MCV: 88.2 fL (ref 80.0–100.0)
Platelets: 291 10*3/uL (ref 150–400)
RBC: 4.16 MIL/uL (ref 3.87–5.11)
RDW: 12.7 % (ref 11.5–15.5)
WBC: 10.1 10*3/uL (ref 4.0–10.5)
nRBC: 0 % (ref 0.0–0.2)

## 2022-12-27 LAB — BASIC METABOLIC PANEL
Anion gap: 7 (ref 5–15)
BUN: 13 mg/dL (ref 8–23)
CO2: 24 mmol/L (ref 22–32)
Calcium: 8.7 mg/dL — ABNORMAL LOW (ref 8.9–10.3)
Chloride: 100 mmol/L (ref 98–111)
Creatinine, Ser: 0.8 mg/dL (ref 0.44–1.00)
GFR, Estimated: >60 mL/min (ref >60)
Glucose, Bld: 141 mg/dL — ABNORMAL HIGH (ref 70–99)
Potassium: 3.6 mmol/L (ref 3.5–5.1)
Sodium: 131 mmol/L — ABNORMAL LOW (ref 135–145)

## 2022-12-27 LAB — TROPONIN I (HIGH SENSITIVITY)
Troponin I (High Sensitivity): 17 ng/L (ref <18)
Troponin I (High Sensitivity): 15 ng/L (ref <18)

## 2022-12-27 LAB — RESP PANEL BY RT-PCR (RSV, FLU A&B, COVID)  RVPGX2
Influenza A by PCR: NEGATIVE
Influenza B by PCR: NEGATIVE
Resp Syncytial Virus by PCR: NEGATIVE
SARS Coronavirus 2 by RT PCR: NEGATIVE

## 2022-12-27 MED ORDER — PREDNISONE 10 MG PO TABS: 20.0000 mg | ORAL_TABLET | Freq: Every day | ORAL | 0 refills | Status: DC

## 2022-12-27 MED ORDER — DOXYCYCLINE HYCLATE 100 MG PO CAPS: 100.0000 mg | ORAL_CAPSULE | Freq: Two times a day (BID) | ORAL | 0 refills | Status: DC

## 2022-12-27 MED ORDER — BENZONATATE 100 MG PO CAPS: 100.0000 mg | ORAL_CAPSULE | Freq: Three times a day (TID) | ORAL | 0 refills | Status: DC

## 2022-12-27 NOTE — ED Provider Notes (Signed)
[place] [facility]ERGENCY DEPARTMENT AT [facility] Provider Note   CSN: [id number] Arrival date & time: 12/27/22  7829     History  Chief Complaint  Patient presents with   Shortness of Breath   Chest Pain    Michelle Zavala is a 77 y.o. female with hyperlipidemia, hypertension, CAD, COPD, dyspnea on exertion, PSVT, bilateral carotid artery disease presented with shortness of breath that began this morning.  Patient that she woke up and was short of breath but after taking albuterol treatment symptoms resolved.  Patient noted that she had a mildly productive cough with gray mucus.  Patient states her shortness of breath has been controlled but wanted to be checked out in case anything more severe would be going on.  Patient states that her left ribs underneath her breast hurt when she coughs but otherwise denied any chest pain.  Patient denies any hemoptysis, recent travel/surgeries/hospitalizations, leg swelling, previous blood clots, history of cancer, loss consciousness, changes in sensation/motor skills, fever/chills, nausea/vomiting, diarrhea  Home Medications Prior to Admission medications   Medication Sig Start Date End Date Taking? Authorizing Provider  benzonatate (TESSALON) 100 MG capsule Take 1 capsule (100 mg total) by mouth every 8 (eight) hours. 12/27/22  Yes Linell Meldrum, Beverly Gust, PA-C  predniSONE (DELTASONE) 10 MG tablet Take 2 tablets (20 mg total) by mouth daily. 12/27/22  Yes Netta Corrigan, PA-C  acetaminophen (TYLENOL) 650 MG CR tablet Take 1,300 mg by mouth every 8 (eight) hours as needed for pain.     [provider]  albuterol (VENTOLIN HFA) 108 (90 Base) MCG/ACT inhaler Inhale 2 puffs into the lungs every 4 (four) hours as needed for wheezing or shortness of breath.    [provider]  amitriptyline (ELAVIL) 75 MG tablet Take 75 mg by mouth at bedtime. 01/27/15   [provider]  cephALEXin (KEFLEX) 500 MG capsule Take 1 capsule (500 mg  total) by mouth 4 (four) times daily. 11/09/22   Gloris Manchester, MD  colchicine 0.6 MG tablet Take 0.6 mg by mouth daily. 10/27/22   [provider]  Cyanocobalamin (VITAMIN B-12) 5000 MCG TBDP Take 1 tablet by mouth daily.    [provider]  hydrochlorothiazide (HYDRODIURIL) 25 MG tablet TAKE ONE TABLET BY MOUTH EVERY MORNING 08/23/22   Jonelle Sidle, MD  HYDROcodone-acetaminophen (NORCO/VICODIN) 5-325 MG tablet Take 1 tablet by mouth every 6 (six) hours as needed for moderate pain. Patient not taking: Reported on 12/23/2022 11/18/22   Eldred Manges, MD  HYDROcodone-acetaminophen (NORCO/VICODIN) 5-325 MG tablet Take 1 tablet by mouth every 6 (six) hours as needed for moderate pain. 12/24/22   Eldred Manges, MD  ipratropium-albuterol (DUONEB) 0.5-2.5 (3) MG/3ML SOLN Take 3 mLs by nebulization every 6 (six) hours as needed for up to 60 doses. 09/15/22   Carmel Sacramento A, PA-C  levocetirizine (XYZAL) 5 MG tablet Take 5 mg by mouth at bedtime. 11/01/22   [provider]  LORazepam (ATIVAN) 1 MG tablet Take 1 mg by mouth 2 (two) times daily. 10/22/22   [provider]  meclizine (ANTIVERT) 25 MG tablet Take 25 mg by mouth 3 (three) times daily. 10/19/22   [provider]  methocarbamol (ROBAXIN) 500 MG tablet Take 1 tablet (500 mg total) by mouth 3 (three) times daily. 09/08/22   Vickki Hearing, MD  metoprolol succinate (TOPROL-XL) 50 MG 24 hr tablet TAKE 1 AND 1/2 TABLETS BY MOUTH DAILY WITH OR imediately following a MEAL. 10/06/22  Jonelle Sidle, MD  ondansetron (ZOFRAN) 4 MG tablet Take 4 mg by mouth every 8 (eight) hours as needed for nausea. 10/25/22   [provider]  pantoprazole (PROTONIX) 40 MG tablet Take 40 mg by mouth 2 (two) times daily. 07/30/22   [provider]  potassium chloride SA (KLOR-CON M) 20 MEQ tablet Take 2 tablets (40 mEq total) by mouth daily. 09/15/21   Strader, Lennart Pall, PA-C  sertraline (ZOLOFT) 100 MG tablet  Take 100 mg by mouth daily.    [provider]      Allergies    Sulfonamide derivatives, Atorvastatin, Crestor [rosuvastatin], Entresto [sacubitril-valsartan], and Farxiga [dapagliflozin]    Review of Systems   Review of Systems  Respiratory:  Positive for shortness of breath.   Cardiovascular:  Positive for chest pain.  See HPI  Physical Exam Updated Vital Signs BP (!) 146/88 (BP Location: Right Arm)   Pulse 96   Temp 97.7 F (36.5 C) (Oral)   Resp 20   SpO2 98%  Physical Exam Constitutional:      General: She is not in acute distress. HENT:     Head: Normocephalic and atraumatic.     Mouth/Throat:     Mouth: Mucous membranes are moist.     Comments: Airway patent Eyes:     Extraocular Movements: Extraocular movements intact.     Conjunctiva/sclera: Conjunctivae normal.     Pupils: Pupils are equal, round, and reactive to light.  Cardiovascular:     Rate and Rhythm: Normal rate and regular rhythm.     Pulses: Normal pulses.     Heart sounds: Normal heart sounds.     Comments: 2+ bilateral radial pulses with regular rate Pulmonary:     Effort: Pulmonary effort is normal. No respiratory distress.     Breath sounds: Normal breath sounds.  Abdominal:     Palpations: Abdomen is soft.     Tenderness: There is no abdominal tenderness. There is no guarding or rebound.  Musculoskeletal:        General: Normal range of motion.     Cervical back: Normal range of motion. No tenderness.     Comments: Chest pain reproducible palpation under left breast, no step-off/crepitus/abnormalities palpated  Skin:    General: Skin is warm and dry.     Capillary Refill: Capillary refill takes less than 2 seconds.     Comments: No dermatomal rash noted under left breast  Neurological:     General: No focal deficit present.     Mental Status: She is alert and oriented to person, place, and time.  Psychiatric:        Mood and Affect: Mood normal.     ED Results / Procedures /  Treatments   Labs (all labs ordered are listed, but only abnormal results are displayed) Labs Reviewed  BASIC METABOLIC PANEL - Abnormal; Notable for the following components:      Result Value   Sodium 131 (*)    Glucose, Bld 141 (*)    Calcium 8.7 (*)    All other components within normal limits  RESP PANEL BY RT-PCR (RSV, FLU A&B, COVID)  RVPGX2  CBC  TROPONIN I (HIGH SENSITIVITY)  TROPONIN I (HIGH SENSITIVITY)    EKG EKG Interpretation  Date/Time:  Monday December 27 2022 10:13:53 EDT Ventricular Rate:  98 PR Interval:  192 QRS Duration: 98 QT Interval:  412 QTC Calculation: 525 R Axis:   -47 Text Interpretation: Normal sinus rhythm Left anterior  fascicular block Moderate voltage criteria for LVH, may be normal variant ( R in aVL , Cornell product ) Prolonged QT Abnormal ECG When compared with ECG of 09-Nov-2022 07:59, PREVIOUS ECG IS PRESENT Confirmed by Gerhard Munch [phone]) on 12/27/2022 10:27:29 AM  Radiology DG Chest 2 View  Result Date: 12/27/2022 CLINICAL DATA:  Short of breath.  Cough.  Chest pain. EXAM: CHEST - 2 VIEW COMPARISON:  09/15/2022. FINDINGS: Cardiac silhouette is normal in size. No mediastinal or hilar masses. No evidence of adenopathy. Bilateral interstitial thickening most evident in the bases. Additional linear opacities also noted in the lung bases consistent with atelectasis or scarring. Linear lung base opacities have increased when compared to the prior exam. Interstitial thickening is similar. No lung consolidation. No pleural effusion or pneumothorax. Skeletal structures are intact. IMPRESSION: 1. Linear opacities at both lung bases suspected to be due to atelectasis, new compared to the prior radiographs. 2. No evidence of pneumonia.  No pulmonary edema. Electronically Signed   By: Amie Portland M.D.   On: 12/27/2022 10:36    Procedures Procedures    Medications Ordered in ED Medications - No data to display  ED Course/ Medical Decision Making/  A&P                             Medical Decision Making Amount and/or Complexity of Data Reviewed Labs: ordered. Radiology: ordered.   Brycelynn Empey 77 y.o. presented today for shortness of breath.  Working DDx that I considered at this time includes, but not limited to, asthma, URI, viral illness, anemia, ACS, PE, pneumonia, pleural effusion, lung cancer.  R/o DDx: ACS, pneumonia, PE, pleural effusion, lung cancer, URI/viral illness, anemia, asthma exacerbation: These are considered less likely due to history of present illness and physical exam findings  Review of prior external notes: 12/23/2022 office visit  Unique Tests and My Interpretation:  CBC: Unremarkable BMP: Unremarkable, similar to previous readings EKG: Sinus 98 bpm, left anterior fascicular block, prolonged QT, no ST depressions or elevations noted Troponin: 15, similar to previous readings CXR: Bilateral atelectasis noted on lower lung fields Respiratory Panel: Negative  Discussion with Independent Historian: None  Discussion of Management of Tests: None  Risk: Medium: prescription drug management  Risk Stratification Score:  Well's Score: 0  Staffed with Jeraldine Loots, MD  Plan: Patient presented for shortness of breath.  On exam patient was in no acute distress and stable vitals.  Patient was not hypoxic and did not have any adventitious lung sounds noted on exam or cyanosis noted.  Patient is well score is 0 combined with her symptoms improving after albuterol treatment at home I have low suspicion for a PE at this time will not pursue advanced imaging.  Patient's chest x-ray showed atelectasis in both lungs which I suspect is causing patient's shortness of breath.  Patient will be given prednisone to decrease inflammation causing atelectasis.  Patient was also educated to follow-up with primary care provider regarding recent symptoms and ER visit.  Patient also be given Tessalon to help reduce her cough as a suspect  that is what is causing her chest pain as her chest pain is reproducible to palpation in her left lungs and she stated that her chest pain occurs when she is coughing.  Patient was given return precautions. Patient stable for discharge at this time.  Patient verbalized understanding of plan.         Final Clinical Impression(s) /  ED Diagnoses Final diagnoses:  Atelectasis    Rx / DC Orders ED Discharge Orders          Ordered    benzonatate (TESSALON) 100 MG capsule  Every 8 hours        12/27/22 1425    doxycycline (VIBRAMYCIN) 100 MG capsule  2 times daily,   Status:  Discontinued        12/27/22 1436    predniSONE (DELTASONE) 10 MG tablet  Daily        12/27/22 1445              Remi Deter 12/27/22 1505    Gerhard Munch, MD 12/29/22 1752

## 2022-12-27 NOTE — ED Provider Triage Note (Signed)
Emergency Medicine Provider Triage Evaluation Note  Michelle Zavala , a 77 y.o. female  was evaluated in triage.  Pt complains of cough, chest tightness and shortness of breath.  Symptoms began yesterday.  Worse this morning upon waking.  Describes a cough that is productive of colored sputum.  States her chest feels tight and she has been experiencing some wheezing.  She has used her nebulizer machine at home without improvement.  States she was outside yesterday which may have contributed to her current symptoms.  She denies any fever, chills, peripheral edema.  Notes chest pain yesterday but pain has since resolved.  Review of Systems  Positive: Cough, chest pain and shortness of breath Negative: Fever, chills, lower extremity edema  Physical Exam  BP (!) 146/88 (BP Location: Right Arm)   Pulse 96   Temp 97.7 F (36.5 C) (Oral)   Resp 20   SpO2 98%  Gen:   Awake, no distress   Resp:  Normal effort, diminished lung sounds MSK:   Moves extremities without difficulty  Other:    Medical Decision Making  Medically screening exam initiated at 10:44 AM.  Appropriate orders placed.  Michelle Zavala was informed that the remainder of the evaluation will be completed by another provider, this initial triage assessment does not replace that evaluation, and the importance of remaining in the ED until their evaluation is complete.     Pauline Aus, PA-C 12/27/22 1048

## 2022-12-27 NOTE — ED Triage Notes (Signed)
Pt c/o sob since yesterday with "a little" prod cough. Denies fevers. No ble swelling noted. A/o. Color wnl. C/o a quick sharp pain "around my heart" this am but went away.

## 2022-12-27 NOTE — Discharge Instructions (Addendum)
Please pick up the tessalon I have prescribed for you.  I have also prescribed for you Tessalon to help reduce your cough as I suspect this was causing her chest to hurt.  Please follow-up with your primary care provider in the next few days to be reevaluated as symptoms may change.  Please continue to use your albuterol inhaler as prescribed.  If symptoms begin to worsen please return to ER.

## 2022-12-30 ENCOUNTER — Ambulatory Visit (INDEPENDENT_AMBULATORY_CARE_PROVIDER_SITE_OTHER): Payer: 59 | Admitting: Orthopaedic Surgery

## 2022-12-30 ENCOUNTER — Encounter: Payer: Self-pay | Admitting: Orthopaedic Surgery

## 2022-12-30 VITALS — Ht 63.0 in | Wt 167.0 lb

## 2022-12-30 DIAGNOSIS — M7061 Trochanteric bursitis, right hip: Secondary | ICD-10-CM

## 2022-12-30 MED ORDER — LIDOCAINE HCL 1 % IJ SOLN
1.0000 mL | INTRAMUSCULAR | Status: AC | PRN
Start: 2022-12-30 — End: 2022-12-30
  Administered 2022-12-30: 1 mL

## 2022-12-30 MED ORDER — BUPIVACAINE HCL 0.25 % IJ SOLN
2.0000 mL | INTRAMUSCULAR | Status: AC | PRN
Start: 2022-12-30 — End: 2022-12-30
  Administered 2022-12-30: 2 mL via INTRA_ARTICULAR

## 2022-12-30 MED ORDER — METHYLPREDNISOLONE ACETATE 40 MG/ML IJ SUSP
40.0000 mg | INTRAMUSCULAR | Status: AC | PRN
Start: 2022-12-30 — End: 2022-12-30
  Administered 2022-12-30: 40 mg via INTRA_ARTICULAR
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
## 2023-01-04 DIAGNOSIS — Z299 Encounter for prophylactic measures, unspecified: Secondary | ICD-10-CM | POA: Diagnosis not present

## 2023-01-04 DIAGNOSIS — R42 Dizziness and giddiness: Secondary | ICD-10-CM | POA: Diagnosis not present

## 2023-01-04 DIAGNOSIS — I700 Atherosclerosis of aorta: Secondary | ICD-10-CM | POA: Diagnosis not present

## 2023-01-04 DIAGNOSIS — I10 Essential (primary) hypertension: Secondary | ICD-10-CM | POA: Diagnosis not present

## 2023-01-06 ENCOUNTER — Encounter: Payer: Self-pay | Admitting: Cardiology

## 2023-01-06 ENCOUNTER — Ambulatory Visit: Payer: 59 | Attending: Cardiology | Admitting: Cardiology

## 2023-01-06 ENCOUNTER — Encounter: Payer: Self-pay | Admitting: Physician Assistant

## 2023-01-06 VITALS — BP 136/84 | HR 97 | Ht 63.0 in | Wt 159.4 lb

## 2023-01-06 DIAGNOSIS — I5020 Unspecified systolic (congestive) heart failure: Secondary | ICD-10-CM

## 2023-01-06 DIAGNOSIS — M791 Myalgia, unspecified site: Secondary | ICD-10-CM | POA: Diagnosis not present

## 2023-01-06 DIAGNOSIS — I471 Supraventricular tachycardia, unspecified: Secondary | ICD-10-CM

## 2023-01-06 DIAGNOSIS — T466X5A Adverse effect of antihyperlipidemic and antiarteriosclerotic drugs, initial encounter: Secondary | ICD-10-CM

## 2023-01-06 DIAGNOSIS — E782 Mixed hyperlipidemia: Secondary | ICD-10-CM

## 2023-01-06 MED ORDER — METOPROLOL SUCCINATE ER 50 MG PO TB24: ORAL_TABLET | ORAL | 3 refills | Status: DC

## 2023-01-06 NOTE — Patient Instructions (Signed)
Medication Instructions:  Take Toprol XL 50 mg in the am, 25 mg in the pm  Labwork: None today  Testing/Procedures: None today  Follow-Up: 3-4 months  Any Other Special Instructions Will Be Listed Below (If Applicable).  If you need a refill on your cardiac medications before your next appointment, please call your pharmacy.

## 2023-01-06 NOTE — Progress Notes (Signed)
    Cardiology Office Note  Date: 01/06/2023   ID: Michelle Zavala, DOB 09/30/1945, MRN [id number]  History of Present Illness: Michelle Zavala is a 77 y.o. female last seen in January.  She is here today with family member for follow-up visit.  Reports no leg swelling or increasing weight, stable NYHA class II dyspnea.  No recurrent falls, no palpitations or syncope.  I went over her medications.  She previously did not tolerate Entresto due to dizziness and also felt weak on Farxiga.  Also has preferred to avoid other lipid treatment options with multi statin myalgias.  We have discussed transition from HCTZ to Aldactone, however she has preferred to hold the current course.  I reviewed her recent lab work.  Weight is down 8 pounds by our scales.  Physical Exam: VS:  BP 136/84   Pulse 97   Ht  (1.6 m)   Wt 159 lb 6.4 oz (72.3 kg)   SpO2 98%   BMI 28.24 kg/m , BMI Body mass index is 28.24 kg/m.  Wt Readings from Last 3 Encounters:  01/06/23 159 lb 6.4 oz (72.3 kg)  12/30/22 167 lb (75.8 kg)  12/23/22 167 lb (75.8 kg)    General: Patient appears comfortable at rest. HEENT: Conjunctiva and lids normal. Lungs: Decreased breath sounds without wheezing, nonlabored breathing at rest. Cardiac: Regular rate and rhythm, no S3 or significant systolic murmur. Extremities: No pitting edema.  ECG:  An ECG dated 12/27/2022 was personally reviewed today and demonstrated:  Sinus rhythm with LVH, left anterior fascicular block.  Labwork: February 2023: Cholesterol 260, triglycerides 128, HDL 60, LDL 177 10/18/2022: ALT 15; AST 24 11/09/2022: Magnesium 1.4 12/27/2022: BUN 13; Creatinine, Ser 0.80; Hemoglobin 12.4; Platelets 291; Potassium 3.6; Sodium 131   Other Studies Reviewed Today:  Cardiac catheterization 09/17/2021: Conclusions: Mild-moderate, non-obstructive coronary artery disease, including 20% eccentric LMCA stenosis, sequential 40% mid and distal LAD stenoses, 30% OM1 and OM2 lesions,  60% LPL1 stenosis, and 30% proximal RCA stenosis. Mildly elevated left heart, right heart, and pulmonary artery pressures. Mildly reduced Fick cardiac output/index.  Assessment and Plan:  1.  HFrEF with LVEF 35 to 40% by echocardiogram in December 2022, nonischemic cardiomyopathy with cardiac catheterization in January 2023 demonstrating mild to moderate nonobstructive CAD.  GDMT has been limited by medication intolerances as discussed above and patient preference.  Currently on Toprol-XL 50 mg daily which will be increased to 75 mg daily, continue HCTZ with potassium supplement.  2.  Essential hypertension.  3.  PSVT.  4.  COPD.  5.  Asymptomatic, ascending aortic aneurysm of 4.2 cm by chest CTA in January.  6.  Mixed hyperlipidemia with statin myalgias.  LDL 177 in February 2023.  She has declined other treatment options.  Disposition:  Follow up  3 to 4 months.  Signed, Jonelle Sidle, M.D., F.A.C.C. [facility]

## 2023-01-11 ENCOUNTER — Ambulatory Visit (INDEPENDENT_AMBULATORY_CARE_PROVIDER_SITE_OTHER): Payer: 59 | Admitting: Internal Medicine

## 2023-01-11 ENCOUNTER — Telehealth: Payer: Self-pay | Admitting: *Deleted

## 2023-01-11 ENCOUNTER — Encounter: Payer: Self-pay | Admitting: *Deleted

## 2023-01-11 ENCOUNTER — Other Ambulatory Visit: Payer: Self-pay | Admitting: *Deleted

## 2023-01-11 ENCOUNTER — Encounter: Payer: Self-pay | Admitting: Internal Medicine

## 2023-01-11 VITALS — BP 127/89 | HR 98 | Temp 97.8°F | Ht 63.0 in | Wt 160.2 lb

## 2023-01-11 DIAGNOSIS — Z860100 Personal history of colon polyps, unspecified: Secondary | ICD-10-CM

## 2023-01-11 DIAGNOSIS — K5900 Constipation, unspecified: Secondary | ICD-10-CM | POA: Diagnosis not present

## 2023-01-11 DIAGNOSIS — K921 Melena: Secondary | ICD-10-CM | POA: Diagnosis not present

## 2023-01-11 MED ORDER — PEG 3350-KCL-NA BICARB-NACL 420 G PO SOLR: 4000.0000 mL | Freq: Once | ORAL | 0 refills | Status: AC

## 2023-01-11 NOTE — Telephone Encounter (Signed)
[employer] PA: Notification or Prior Authorization is not required for the requested services You are not required to submit a notification/prior authorization based on the information provided. If you have general questions about the prior authorization requirements, visit [email or web address] > Clinician Resources > Advance and Admission Notification Requirements. The number above acknowledges your notification. Please write this reference number down for future reference. If you would like to request an organization determination, please call us at [phone]. Decision ID #: [id number]

## 2023-01-11 NOTE — Patient Instructions (Addendum)
Good to see you again today!  As discussed, we will proceed with an EGD and colonoscopy (history of black stools and colonic polyps).  ASA 3.  Linzess s best taken every day.  Take Linzess 145 every day for constipation  Add Colace 100 mg twice daily (over-the-counter stool softener)  Further recommendations to follow.
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
## 2023-01-20 ENCOUNTER — Ambulatory Visit: Payer: 59 | Admitting: Physician Assistant

## 2023-01-20 ENCOUNTER — Ambulatory Visit: Payer: 59

## 2023-01-21 ENCOUNTER — Ambulatory Visit: Payer: 59 | Admitting: Orthopedic Surgery

## 2023-01-23 DIAGNOSIS — J449 Chronic obstructive pulmonary disease, unspecified: Secondary | ICD-10-CM | POA: Diagnosis not present

## 2023-01-23 DIAGNOSIS — S72012D Unspecified intracapsular fracture of left femur, subsequent encounter for closed fracture with routine healing: Secondary | ICD-10-CM | POA: Diagnosis not present

## 2023-01-23 DIAGNOSIS — M6281 Muscle weakness (generalized): Secondary | ICD-10-CM | POA: Diagnosis not present

## 2023-01-23 DIAGNOSIS — R2689 Other abnormalities of gait and mobility: Secondary | ICD-10-CM | POA: Diagnosis not present

## 2023-01-23 DIAGNOSIS — R42 Dizziness and giddiness: Secondary | ICD-10-CM | POA: Diagnosis not present

## 2023-02-03 ENCOUNTER — Ambulatory Visit: Payer: 59 | Admitting: Orthopaedic Surgery

## 2023-02-04 DIAGNOSIS — R52 Pain, unspecified: Secondary | ICD-10-CM | POA: Diagnosis not present

## 2023-02-04 DIAGNOSIS — Z299 Encounter for prophylactic measures, unspecified: Secondary | ICD-10-CM | POA: Diagnosis not present

## 2023-02-04 DIAGNOSIS — J441 Chronic obstructive pulmonary disease with (acute) exacerbation: Secondary | ICD-10-CM | POA: Diagnosis not present

## 2023-02-10 ENCOUNTER — Ambulatory Visit: Payer: 59 | Admitting: Orthopaedic Surgery

## 2023-02-14 DIAGNOSIS — I10 Essential (primary) hypertension: Secondary | ICD-10-CM | POA: Diagnosis not present

## 2023-02-14 DIAGNOSIS — Z299 Encounter for prophylactic measures, unspecified: Secondary | ICD-10-CM | POA: Diagnosis not present

## 2023-02-14 DIAGNOSIS — M25551 Pain in right hip: Secondary | ICD-10-CM | POA: Diagnosis not present

## 2023-02-14 DIAGNOSIS — M25519 Pain in unspecified shoulder: Secondary | ICD-10-CM | POA: Diagnosis not present

## 2023-02-16 ENCOUNTER — Encounter (HOSPITAL_COMMUNITY)
Admission: RE | Admit: 2023-02-16 | Discharge: 2023-02-16 | Disposition: A | Discharge status: Home / Self Care | Payer: 59 | Source: Ambulatory Visit | Attending: Internal Medicine | Admitting: Internal Medicine

## 2023-02-16 ENCOUNTER — Encounter (HOSPITAL_COMMUNITY): Payer: Self-pay

## 2023-02-16 HISTORY — DX: Dyspnea, unspecified: R06.00

## 2023-02-16 HISTORY — DX: Sleep apnea, unspecified: G47.30

## 2023-02-16 NOTE — Patient Instructions (Signed)
Michelle Zavala  02/16/2023     @PREFPERIOPPHARMACY @   Your procedure is scheduled on 02/18/2023.  Report to [facility] at 8:00 A.M.  Call this number if you have problems the morning of surgery:  [phone]  If you experience any cold or flu symptoms such as cough, fever, chills, shortness of breath, etc. between now and your scheduled surgery, please notify us at the above number.   Remember:  ' Please follow the diet and prep instructions given to you by Dr Luvenia Starch office.    Take these medicines the morning of surgery with A SIP OF WATER : Ativan Antivert Metoprolol Zofran (if needed) Pantoprazole and Zoloft    Do not wear jewelry, make-up or nail polish, including gel polish,  artificial nails, or any other type of covering on natural nails (fingers and  toes).  Do not wear lotions, powders, or perfumes, or deodorant.  Do not shave 48 hours prior to surgery.  Men may shave face and neck.  Do not bring valuables to the hospital.  [facility] is not responsible for any belongings or valuables.  Contacts, dentures or bridgework may not be worn into surgery.  Leave your suitcase in the car.  After surgery it may be brought to your room.  For patients admitted to the hospital, discharge time will be determined by your treatment team.  Patients discharged the day of surgery will not be allowed to drive home.   Name and phone number of your driver:   family Special instructions:  N/A  Please read over the following fact sheets that you were given. Care and Recovery After Surgery  Upper Endoscopy, Adult Upper endoscopy is a procedure to look inside the upper GI (gastrointestinal) tract. The upper GI tract is made up of: The esophagus. This is the part of the body that moves food from your mouth to your stomach. The stomach. The duodenum. This is the first part of your small intestine. This procedure is also called esophagogastroduodenoscopy (EGD) or gastroscopy. In this  procedure, your health care provider passes a thin, flexible tube (endoscope) through your mouth and down your esophagus into your stomach and into your duodenum. A small camera is attached to the end of the tube. Images from the camera appear on a monitor in the exam room. During this procedure, your health care provider may also remove a small piece of tissue to be sent to a lab and examined under a microscope (biopsy). Your health care provider may do an upper endoscopy to diagnose cancers of the upper GI tract. You may also have this procedure to find the cause of other conditions, such as: Stomach pain. Heartburn. Pain or problems when swallowing. Nausea and vomiting. Stomach bleeding. Stomach ulcers. Tell a health care provider about: Any allergies you have. All medicines you are taking, including vitamins, herbs, eye drops, creams, and over-the-counter medicines. Any problems you or family members have had with anesthetic medicines. Any bleeding problems you have. Any surgeries you have had. Any medical conditions you have. Whether you are pregnant or may be pregnant. What are the risks? Your healthcare provider will talk with you about risks. These may include: Infection. Bleeding. Allergic reactions to medicines. A tear or hole (perforation) in the esophagus, stomach, or duodenum. What happens before the procedure? When to stop eating and drinking Follow instructions from your health care provider about what you may eat and drink. These may include: 8 hours before your procedure Stop eating most foods.  Do not eat meat, fried foods, or fatty foods. Eat only light foods, such as toast or crackers. All liquids are okay except energy drinks and alcohol. 6 hours before your procedure Stop eating. Drink only clear liquids, such as water, clear fruit juice, black coffee, plain tea, and sports drinks. Do not drink energy drinks or alcohol. 2 hours before your procedure Stop  drinking all liquids. You may be allowed to take medicines with small sips of water. If you do not follow your health care provider's instructions, your procedure may be delayed or canceled. Medicines Ask your health care provider about: Changing or stopping your regular medicines. This is especially important if you are taking diabetes medicines or blood thinners. Taking medicines such as aspirin and ibuprofen. These medicines can thin your blood. Do not take these medicines unless your health care provider tells you to take them. Taking over-the-counter medicines, vitamins, herbs, and supplements. General instructions If you will be going home right after the procedure, plan to have a responsible adult: Take you home from the hospital or clinic. You will not be allowed to drive. Care for you for the time you are told. What happens during the procedure?  An IV will be inserted into one of your veins. You may be given one or more of the following: A medicine to help you relax (sedative). A medicine to numb the throat (local anesthetic). You will lie on your left side on an exam table. Your health care provider will pass the endoscope through your mouth and down your esophagus. Your health care provider will use the scope to check the inside of your esophagus, stomach, and duodenum. Biopsies may be taken. The endoscope will be removed. The procedure may vary among health care providers and hospitals. What happens after the procedure? Your blood pressure, heart rate, breathing rate, and blood oxygen level will be monitored until you leave the hospital or clinic. When your throat is no longer numb, you may be given some fluids to drink. If you were given a sedative during the procedure, it can affect you for several hours. Do not drive or operate machinery until your health care provider says that it is safe. It is up to you to get the results of your procedure. Ask your health care provider,  or the department that is doing the procedure, when your results will be ready. Contact a health care provider if you: Have a sore throat that lasts longer than 1 day. Have a fever. Get help right away if you: Vomit blood or your vomit looks like coffee grounds. Have bloody, black, or tarry stools. Have a very bad sore throat or you cannot swallow. Have difficulty breathing or very bad pain in your chest or abdomen. These symptoms may be an emergency. Get help right away. Call 911. Do not wait to see if the symptoms will go away. Do not drive yourself to the hospital. Summary Upper endoscopy is a procedure to look inside the upper GI tract. During the procedure, an IV will be inserted into one of your veins. You may be given a medicine to help you relax. The endoscope will be passed through your mouth and down your esophagus. Follow instructions from your health care provider about what you can eat and drink. This information is not intended to replace advice given to you by your health care provider. Make sure you discuss any questions you have with your health care provider. Document Revised: 12/09/2021 Document Reviewed: 12/09/2021 Elsevier  Patient Education  2024 [employer]  Colonoscopy, Adult A colonoscopy is a procedure to look at the entire large intestine. This procedure is done using a long, thin, flexible tube that has a camera on the end. You may have a colonoscopy: As a part of normal colorectal screening. If you have certain symptoms, such as: A low number of red blood cells in your blood (anemia). Diarrhea that does not go away. Pain in your abdomen. Blood in your stool. A colonoscopy can help screen for and diagnose medical problems, including: An abnormal growth of cells or tissue (tumor). Abnormal growths within the lining of your intestine (polyps). Inflammation. Areas of bleeding. Tell your health care provider about: Any allergies you have. All medicines  you are taking, including vitamins, herbs, eye drops, creams, and over-the-counter medicines. Any problems you or family members have had with anesthetic medicines. Any bleeding problems you have. Any surgeries you have had. Any medical conditions you have. Any problems you have had with having bowel movements. Whether you are pregnant or may be pregnant. What are the risks? Generally, this is a safe procedure. However, problems may occur, including: Bleeding. Damage to your intestine. Allergic reactions to medicines given during the procedure. Infection. This is rare. What happens before the procedure? Eating and drinking restrictions Follow instructions from your health care provider about eating or drinking restrictions, which may include: A few days before the procedure: Follow a low-fiber diet. Avoid nuts, seeds, dried fruit, raw fruits, and vegetables. 1-3 days before the procedure: Eat only gelatin dessert or ice pops. Drink only clear liquids, such as water, clear juice, clear broth or bouillon, black coffee or tea, or clear soft drinks or sports drinks. Avoid liquids that contain red or purple dye. The day of the procedure: Do not eat solid foods. You may continue to drink clear liquids until up to 2 hours before the procedure. Do not eat or drink anything starting 2 hours before the procedure, or within the time period that your health care provider recommends. Bowel prep If you were prescribed a bowel prep to take by mouth (orally) to clean out your colon: Take it as told by your health care provider. Starting the day before your procedure, you will need to drink a large amount of liquid medicine. The liquid will cause you to have many bowel movements of loose stool until your stool becomes almost clear or light green. If your skin or the opening between the buttocks (anus) gets irritated from diarrhea, you may relieve the irritation using: Wipes with medicine in them, such  as adult wet wipes with aloe and vitamin E. A product to soothe skin, such as petroleum jelly. If you vomit while drinking the bowel prep: Take a break for up to 60 minutes. Begin the bowel prep again. Call your health care provider if you keep vomiting or you cannot take the bowel prep without vomiting. To clean out your colon, you may also be given: Laxative medicines. These help you have a bowel movement. Instructions for enema use. An enema is liquid medicine injected into your rectum. Medicines Ask your health care provider about: Changing or stopping your regular medicines or supplements. This is especially important if you are taking iron supplements, diabetes medicines, or blood thinners. Taking medicines such as aspirin and ibuprofen. These medicines can thin your blood. Do not take these medicines unless your health care provider tells you to take them. Taking over-the-counter medicines, vitamins, herbs, and supplements. General instructions Ask  your health care provider what steps will be taken to help prevent infection. These may include washing skin with a germ-killing soap. If you will be going home right after the procedure, plan to have a responsible adult: Take you home from the hospital or clinic. You will not be allowed to drive. Care for you for the time you are told. What happens during the procedure?  An IV will be inserted into one of your veins. You will be given a medicine to make you fall asleep (general anesthetic). You will lie on your side with your knees bent. A lubricant will be put on the tube. Then the tube will be: Inserted into your anus. Gently eased through all parts of your large intestine. Air will be sent into your colon to keep it open. This may cause some pressure or cramping. Images will be taken with the camera and will appear on a screen. A small tissue sample may be removed to be looked at under a microscope (biopsy). The tissue may be sent  to a lab for testing if any signs of problems are found. If small polyps are found, they may be removed and checked for cancer cells. When the procedure is finished, the tube will be removed. The procedure may vary among health care providers and hospitals. What happens after the procedure? Your blood pressure, heart rate, breathing rate, and blood oxygen level will be monitored until you leave the hospital or clinic. You may have a small amount of blood in your stool. You may pass gas and have mild cramping or bloating in your abdomen. This is caused by the air that was used to open your colon during the exam. If you were given a sedative during the procedure, it can affect you for several hours. Do not drive or operate machinery until your health care provider says that it is safe. It is up to you to get the results of your procedure. Ask your health care provider, or the department that is doing the procedure, when your results will be ready. Summary A colonoscopy is a procedure to look at the entire large intestine. Follow instructions from your health care provider about eating and drinking before the procedure. If you were prescribed an oral bowel prep to clean out your colon, take it as told by your health care provider. During the colonoscopy, a flexible tube with a camera on its end is inserted into the anus and then passed into all parts of the large intestine. This information is not intended to replace advice given to you by your health care provider. Make sure you discuss any questions you have with your health care provider. Document Revised: 10/12/2022 Document Reviewed: 04/22/2021 Elsevier Patient Education  2024 Else[employer] Inc.   Monitored Anesthesia Care Anesthesia refers to the techniques, procedures, and medicines that help a person stay safe and comfortable during surgery. Monitored anesthesia care, or sedation, is one type of anesthesia. You may have sedation if you do not  need to be asleep for your procedure. Procedures that use sedation may include: Surgery to remove cataracts from your eyes. A dental procedure. A biopsy. This is when a tissue sample is removed and looked at under a microscope. You will be watched closely during your procedure. Your level of sedation or type of anesthesia may be changed to fit your needs. Tell a health care provider about: Any allergies you have. All medicines you are taking, including vitamins, herbs, eye drops, creams, and over-the-counter medicines.  Any problems you or family members have had with anesthesia. Any bleeding problems you have. Any surgeries you have had. Any medical conditions or illnesses you have. This includes sleep apnea, cough, fever, or the flu. Whether you are pregnant or may be pregnant. Whether you use cigarettes, alcohol, or drugs. Any use of steroids, whether by mouth or as a cream. What are the risks? Your health care provider will talk with you about risks. These may include: Getting too much medicine (oversedation). Nausea. Allergic reactions to medicines. Trouble breathing. If this happens, a breathing tube may be used to help you breathe. It will be removed when you are awake and breathing on your own. Heart trouble. Lung trouble. Confusion that gets better with time (emergence delirium). What happens before the procedure? When to stop eating and drinking Follow instructions from your health care provider about what you may eat and drink. These may include: 8 hours before your procedure Stop eating most foods. Do not eat meat, fried foods, or fatty foods. Eat only light foods, such as toast or crackers. All liquids are okay except energy drinks and alcohol. 6 hours before your procedure Stop eating. Drink only clear liquids, such as water, clear fruit juice, black coffee, plain tea, and sports drinks. Do not drink energy drinks or alcohol. 2 hours before your procedure Stop  drinking all liquids. You may be allowed to take medicines with small sips of water. If you do not follow your health care provider's instructions, your procedure may be delayed or canceled. Medicines Ask your health care provider about: Changing or stopping your regular medicines. These include any diabetes medicines or blood thinners you take. Taking medicines such as aspirin and ibuprofen. These medicines can thin your blood. Do not take them unless your health care provider tells you to. Taking over-the-counter medicines, vitamins, herbs, and supplements. Testing You may have an exam or testing. You may have a blood or urine sample taken. General instructions Do not use any products that contain nicotine or tobacco for at least 4 weeks before the procedure. These products include cigarettes, chewing tobacco, and vaping devices, such as e-cigarettes. If you need help quitting, ask your health care provider. If you will be going home right after the procedure, plan to have a responsible adult: Take you home from the hospital or clinic. You will not be allowed to drive. Care for you for the time you are told. What happens during the procedure?  Your blood pressure, heart rate, breathing, level of pain, and blood oxygen level will be monitored. An IV will be inserted into one of your veins. You may be given: A sedative. This helps you relax. Anesthesia. This will: Numb certain areas of your body. Make you fall asleep for surgery. You will be given medicines as needed to keep you comfortable. The more medicine you are given, the deeper your level of sedation will be. Your level of sedation may be changed to fit your needs. There are three levels of sedation: Mild sedation. At this level, you may feel awake and relaxed. You will be able to follow directions. Moderate sedation. At this level, you will be sleepy. You may not remember the procedure. Deep sedation. At this level, you will be  asleep. You will not remember the procedure. How you get the medicines will depend on your age and the procedure. They may be given as: A pill. This may be taken by mouth (orally) or inserted into the rectum. An injection. This  may be into a vein or muscle. A spray through the nose. After your procedure is over, the medicine will be stopped. The procedure may vary among health care providers and hospitals. What happens after the procedure? Your blood pressure, heart rate, breathing rate, and blood oxygen level will be monitored until you leave the hospital or clinic. You may feel sleepy, clumsy, or nauseous. You may not remember what happened during or after the procedure. Sedation can affect you for several hours. Do not drive or use machinery until your health care provider says that it is safe. This information is not intended to replace advice given to you by your health care provider. Make sure you discuss any questions you have with your health care provider. Document Revised: 01/25/2022 Document Reviewed: 01/25/2022 Elsevier Patient Education  2024 [employer].

## 2023-02-17 DIAGNOSIS — N390 Urinary tract infection, site not specified: Secondary | ICD-10-CM | POA: Diagnosis not present

## 2023-02-18 ENCOUNTER — Ambulatory Visit (HOSPITAL_COMMUNITY)
Admission: RE | Admit: 2023-02-18 | Discharge: 2023-02-18 | Disposition: A | Discharge status: Home / Self Care | Payer: 59 | Attending: Internal Medicine | Admitting: Internal Medicine

## 2023-02-18 ENCOUNTER — Encounter (HOSPITAL_COMMUNITY): Payer: Self-pay | Admitting: Internal Medicine

## 2023-02-18 ENCOUNTER — Other Ambulatory Visit: Payer: Self-pay

## 2023-02-18 ENCOUNTER — Ambulatory Visit (HOSPITAL_COMMUNITY): Payer: 59 | Admitting: Certified Registered"

## 2023-02-18 ENCOUNTER — Ambulatory Visit (HOSPITAL_BASED_OUTPATIENT_CLINIC_OR_DEPARTMENT_OTHER): Payer: 59 | Admitting: Certified Registered"

## 2023-02-18 ENCOUNTER — Encounter (HOSPITAL_COMMUNITY)
Admission: RE | Disposition: A | Discharge status: Home / Self Care | Payer: Self-pay | Source: Home / Self Care | Attending: Internal Medicine

## 2023-02-18 DIAGNOSIS — K5730 Diverticulosis of large intestine without perforation or abscess without bleeding: Secondary | ICD-10-CM | POA: Diagnosis not present

## 2023-02-18 DIAGNOSIS — J449 Chronic obstructive pulmonary disease, unspecified: Secondary | ICD-10-CM | POA: Insufficient documentation

## 2023-02-18 DIAGNOSIS — K5731 Diverticulosis of large intestine without perforation or abscess with bleeding: Secondary | ICD-10-CM

## 2023-02-18 DIAGNOSIS — K921 Melena: Secondary | ICD-10-CM | POA: Diagnosis not present

## 2023-02-18 DIAGNOSIS — K219 Gastro-esophageal reflux disease without esophagitis: Secondary | ICD-10-CM | POA: Insufficient documentation

## 2023-02-18 DIAGNOSIS — Z86010 Personal history of colonic polyps: Secondary | ICD-10-CM | POA: Diagnosis not present

## 2023-02-18 DIAGNOSIS — Z1211 Encounter for screening for malignant neoplasm of colon: Secondary | ICD-10-CM | POA: Diagnosis not present

## 2023-02-18 DIAGNOSIS — I10 Essential (primary) hypertension: Secondary | ICD-10-CM | POA: Diagnosis not present

## 2023-02-18 DIAGNOSIS — I739 Peripheral vascular disease, unspecified: Secondary | ICD-10-CM | POA: Insufficient documentation

## 2023-02-18 DIAGNOSIS — F32A Depression, unspecified: Secondary | ICD-10-CM | POA: Insufficient documentation

## 2023-02-18 DIAGNOSIS — F418 Other specified anxiety disorders: Secondary | ICD-10-CM

## 2023-02-18 DIAGNOSIS — I2510 Atherosclerotic heart disease of native coronary artery without angina pectoris: Secondary | ICD-10-CM

## 2023-02-18 DIAGNOSIS — F419 Anxiety disorder, unspecified: Secondary | ICD-10-CM | POA: Insufficient documentation

## 2023-02-18 HISTORY — PX: ESOPHAGOGASTRODUODENOSCOPY (EGD) WITH PROPOFOL: SHX5813

## 2023-02-18 HISTORY — PX: COLONOSCOPY WITH PROPOFOL: SHX5780

## 2023-02-18 SURGERY — COLONOSCOPY WITH PROPOFOL
Anesthesia: General

## 2023-02-18 MED ORDER — PROPOFOL 1000 MG/100ML IV EMUL
INTRAVENOUS | Status: AC
  Filled 2023-02-18: qty 100

## 2023-02-18 MED ORDER — PROPOFOL 10 MG/ML IV BOLUS
INTRAVENOUS | Status: DC | PRN
  Administered 2023-02-18: 50 mg via INTRAVENOUS

## 2023-02-18 MED ORDER — LIDOCAINE HCL (PF) 2 % IJ SOLN
INTRAMUSCULAR | Status: AC
  Filled 2023-02-18: qty 10

## 2023-02-18 MED ORDER — LIDOCAINE HCL (CARDIAC) PF 100 MG/5ML IV SOSY
PREFILLED_SYRINGE | INTRAVENOUS | Status: DC | PRN
  Administered 2023-02-18: 60 mg via INTRAVENOUS

## 2023-02-18 MED ORDER — PROPOFOL 500 MG/50ML IV EMUL
INTRAVENOUS | Status: DC | PRN
  Administered 2023-02-18: 100 ug/kg/min via INTRAVENOUS

## 2023-02-18 MED ORDER — LACTATED RINGERS IV SOLN: INTRAVENOUS | Status: DC | PRN

## 2023-02-18 NOTE — Op Note (Signed)
[facility] Patient Name: Michelle Zavala Procedure Date: 02/18/2023 8:40 AM MRN: [id number] Date of Birth: 02-12-1946 Attending MD: Gennette Pac , MD, [id number] CSN: [id number] Age: 77 Admit Type: Outpatient Procedure:                Upper GI endoscopy Indications:              Melena Providers:                Gennette Pac, MD, Angelica Ran, Zena Amos Referring MD:              Medicines:                Propofol per Anesthesia Complications:            No immediate complications. Estimated Blood Loss:     Estimated blood loss: none. Procedure:                Pre-Anesthesia Assessment:                           - Prior to the procedure, a History and Physical                            was performed, and patient medications and                            allergies were reviewed. The patient's tolerance of                            previous anesthesia was also reviewed. The risks                            and benefits of the procedure and the sedation                            options and risks were discussed with the patient.                            All questions were answered, and informed consent                            was obtained. Prior Anticoagulants: The patient has                            taken no anticoagulant or antiplatelet agents. ASA                            Grade Assessment: III - A patient with severe                            systemic disease. After reviewing the risks and  benefits, the patient was deemed in satisfactory                            condition to undergo the procedure.                           After obtaining informed consent, the endoscope was                            passed under direct vision. Throughout the                            procedure, the patient's blood pressure, pulse, and                            oxygen saturations were monitored continuously.  The                            [id number]-[id number] ([id number]) scope was introduced through the                            mouth, and advanced to the second part of duodenum.                            The upper GI endoscopy was accomplished without                            difficulty. The patient tolerated the procedure                            well. Scope In: 9:10:21 AM Scope Out: 9:13:26 AM Total Procedure Duration: 0 hours 3 minutes 5 seconds  Findings:      The examined esophagus was normal.      The entire examined stomach was normal.      The duodenal bulb and second portion of the duodenum were normal. Impression:               - Normal esophagus.                           - Normal stomach.                           - Normal duodenal bulb and second portion of the                            duodenum.                           - No specimens collected. Moderate Sedation:      Moderate (conscious) sedation was personally administered by an       anesthesia professional. The following parameters were monitored: oxygen       saturation, heart rate, blood pressure, respiratory rate, EKG, adequacy       of pulmonary ventilation, and response to care. Recommendation:           -  Patient has a contact number available for                            emergencies. The signs and symptoms of potential                            delayed complications were discussed with the                            patient. Return to normal activities tomorrow.                            Written discharge instructions were provided to the                            patient.                           - Advance diet as tolerated.                           - Continue present medications. See colonoscopy                            report. Procedure Code(s):        --- Professional ---                           [phone], Esophagogastroduodenoscopy, flexible,                            transoral; diagnostic, including  collection of                            specimen(s) by brushing or washing, when performed                            (separate procedure) Diagnosis Code(s):        --- Professional ---                           K92.1, Melena (includes Hematochezia) CPT copyright 2022 [employer]. All rights reserved. The codes documented in this report are preliminary and upon coder review may  be revised to meet current compliance requirements. Gerrit Friends. Zeriah Baysinger, MD Gennette Pac, MD 02/18/2023 9:16:26 AM This report has been signed electronically. Number of Addenda: 0

## 2023-02-18 NOTE — Anesthesia Preprocedure Evaluation (Signed)
Anesthesia Evaluation  Patient identified by MRN, date of birth, ID band Patient awake    Reviewed: Allergy & Precautions, H&P , NPO status , Patient's Chart, lab work & pertinent test results, reviewed documented beta blocker date and time   Airway Mallampati: II  TM Distance: >3 FB Neck ROM: full    Dental no notable dental hx. (+) Partial Upper, Missing, Poor Dentition   Pulmonary neg pulmonary ROS, shortness of breath, COPD   Pulmonary exam normal breath sounds clear to auscultation       Cardiovascular Exercise Tolerance: Good hypertension, + CAD and + Peripheral Vascular Disease  negative cardio ROS  Rhythm:regular Rate:Normal     Neuro/Psych  PSYCHIATRIC DISORDERS Anxiety Depression    negative neurological ROS  negative psych ROS   GI/Hepatic negative GI ROS, Neg liver ROS,GERD  ,,  Endo/Other  negative endocrine ROS    Renal/GU Renal diseasenegative Renal ROS  negative genitourinary   Musculoskeletal   Abdominal   Peds  Hematology negative hematology ROS (+)   Anesthesia Other Findings   Reproductive/Obstetrics negative OB ROS                             Anesthesia Physical Anesthesia Plan  ASA: 3  Anesthesia Plan: General   Post-op Pain Management:    Induction:   PONV Risk Score and Plan: Propofol infusion  Airway Management Planned:   Additional Equipment:   Intra-op Plan:   Post-operative Plan:   Informed Consent: I have reviewed the patients History and Physical, chart, labs and discussed the procedure including the risks, benefits and alternatives for the proposed anesthesia with the patient or authorized representative who has indicated his/her understanding and acceptance.     Dental Advisory Given  Plan Discussed with: CRNA  Anesthesia Plan Comments:        Anesthesia Quick Evaluation

## 2023-02-18 NOTE — Op Note (Signed)
[facility] Patient Name: Michelle Zavala Procedure Date: 02/18/2023 8:46 AM MRN: [id number] Date of Birth: [birth year] Attending MD: Gennette Pac , MD, [id number] CSN: [id number] Age: 77 Admit Type: Outpatient Procedure:                Colonoscopy Indications:              High risk colon cancer surveillance: Personal                            history of colonic polyps Providers:                Gennette Pac, MD, Angelica Ran, Zena Amos Referring MD:              Medicines:                Propofol per Anesthesia Complications:            No immediate complications. Estimated Blood Loss:     Estimated blood loss: none. Procedure:                Pre-Anesthesia Assessment:                           - Prior to the procedure, a History and Physical                            was performed, and patient medications and                            allergies were reviewed. The patient's tolerance of                            previous anesthesia was also reviewed. The risks                            and benefits of the procedure and the sedation                            options and risks were discussed with the patient.                            All questions were answered, and informed consent                            was obtained. Prior Anticoagulants: The patient has                            taken no anticoagulant or antiplatelet agents. ASA                            Grade Assessment: III - A patient with severe  systemic disease. After reviewing the risks and                            benefits, the patient was deemed in satisfactory                            condition to undergo the procedure.                           After obtaining informed consent, the colonoscope                            was passed under direct vision. Throughout the                            procedure, the patient's blood pressure,  pulse, and                            oxygen saturations were monitored continuously. The                            [id number]) scope was introduced through the                            anus and advanced to the the cecum, identified by                            appendiceal orifice and ileocecal valve. The                            colonoscopy was performed without difficulty. The                            patient tolerated the procedure well. The quality                            of the bowel preparation was adequate. The                            ileocecal valve, appendiceal orifice, and rectum                            were photographed. The entire colon was well                            visualized. Scope In: 9:18:41 AM Scope Out: 9:31:02 AM Scope Withdrawal Time: 0 hours 6 minutes 25 seconds  Total Procedure Duration: 0 hours 12 minutes 21 seconds  Findings:      The perianal and digital rectal examinations were normal.      Scattered medium-mouthed diverticula were found in the sigmoid colon and       descending colon.      The exam was otherwise without abnormality on direct and retroflexion       views. Impression:               -  Diverticulosis in the sigmoid colon and in the                            descending colon.                           - The examination was otherwise normal on direct                            and retroflexion views.                           - No specimens collected. Negative EGD today.                            History of "melena" quotes is questionable. Moderate Sedation:      Moderate (conscious) sedation was personally administered by an       anesthesia professional. The following parameters were monitored: oxygen       saturation, heart rate, blood pressure, respiratory rate, EKG, adequacy       of pulmonary ventilation, and response to care. Recommendation:           - Patient has a contact number available for                             emergencies. The signs and symptoms of potential                            delayed complications were discussed with the                            patient. Return to normal activities tomorrow.                            Written discharge instructions were provided to the                            patient.                           - Advance diet as tolerated.                           - No repeat colonoscopy due to current age (39                            years or older). In fact, no further GI evaluation                            unless new symptoms develop. She needs to take her                            Linzess every day for constipation which which she  has not been doing.                           - Return to GI clinic in 1 year. Procedure Code(s):        --- Professional ---                           [phone], Colonoscopy, flexible; diagnostic, including                            collection of specimen(s) by brushing or washing,                            when performed (separate procedure) Diagnosis Code(s):        --- Professional ---                           Z86.010, Personal history of colonic polyps                           K57.30, Diverticulosis of large intestine without                            perforation or abscess without bleeding CPT copyright 2022 American Medical Association. All rights reserved. The codes documented in this report are preliminary and upon coder review may  be revised to meet current compliance requirements. Gerrit Friends. Elliott Lasecki, MD Gennette Pac, MD 02/18/2023 9:40:01 AM This report has been signed electronically. Number of Addenda: 0

## 2023-02-18 NOTE — Anesthesia Postprocedure Evaluation (Signed)
Anesthesia Post Note  Patient: Michelle Zavala  Procedure(s) Performed: COLONOSCOPY WITH PROPOFOL ESOPHAGOGASTRODUODENOSCOPY (EGD) WITH PROPOFOL  Patient location during evaluation: Phase II Anesthesia Type: General Level of consciousness: awake Pain management: pain level controlled Vital Signs Assessment: post-procedure vital signs reviewed and stable Respiratory status: spontaneous breathing and respiratory function stable Cardiovascular status: blood pressure returned to baseline and stable Postop Assessment: no headache and no apparent nausea or vomiting Anesthetic complications: no Comments: Late entry   No notable events documented.   Last Vitals:  Vitals:   02/18/23 0815 02/18/23 0935  BP: (!) 163/82 (!) 108/59  Pulse:  64  Resp: 20 16  Temp:  36.6 C  SpO2: 98% 100%    Last Pain:  Vitals:   02/18/23 0935  TempSrc: Axillary  PainSc: 0-No pain                 Windell Norfolk

## 2023-02-18 NOTE — Discharge Instructions (Signed)
Colonoscopy Discharge Instructions  Read the instructions outlined below and refer to this sheet in the next few weeks. These discharge instructions provide you with general information on caring for yourself after you leave the hospital. Your doctor may also give you specific instructions. While your treatment has been planned according to the most current medical practices available, unavoidable complications occasionally occur. If you have any problems or questions after discharge, call Dr. Gala Romney at [phone]. ACTIVITY You may resume your regular activity, but move at a slower pace for the next 24 hours.  Take frequent rest periods for the next 24 hours.  Walking will help get rid of the air and reduce the bloated feeling in your belly (abdomen).  No driving for 24 hours (because of the medicine (anesthesia) used during the test).   Do not sign any important legal documents or operate any machinery for 24 hours (because of the anesthesia used during the test).  NUTRITION Drink plenty of fluids.  You may resume your normal diet as instructed by your doctor.  Begin with a light meal and progress to your normal diet. Heavy or fried foods are harder to digest and may make you feel sick to your stomach (nauseated).  Avoid alcoholic beverages for 24 hours or as instructed.  MEDICATIONS You may resume your normal medications unless your doctor tells you otherwise.  WHAT YOU CAN EXPECT TODAY Some feelings of bloating in the abdomen.  Passage of more gas than usual.  Spotting of blood in your stool or on the toilet paper.  IF YOU HAD POLYPS REMOVED DURING THE COLONOSCOPY: No aspirin products for 7 days or as instructed.  No alcohol for 7 days or as instructed.  Eat a soft diet for the next 24 hours.  FINDING OUT THE RESULTS OF YOUR TEST Not all test results are available during your visit. If your test results are not back during the visit, make an appointment with your caregiver to find out the  results. Do not assume everything is normal if you have not heard from your caregiver or the medical facility. It is important for you to follow up on all of your test results.  SEEK IMMEDIATE MEDICAL ATTENTION IF: You have more than a spotting of blood in your stool.  Your belly is swollen (abdominal distention).  You are nauseated or vomiting.  You have a temperature over 101.  You have abdominal pain or discomfort that is severe or gets worse throughout the day.   EGD Discharge instructions Please read the instructions outlined below and refer to this sheet in the next few weeks. These discharge instructions provide you with general information on caring for yourself after you leave the hospital. Your doctor may also give you specific instructions. While your treatment has been planned according to the most current medical practices available, unavoidable complications occasionally occur. If you have any problems or questions after discharge, please call your doctor. ACTIVITY You may resume your regular activity but move at a slower pace for the next 24 hours.  Take frequent rest periods for the next 24 hours.  Walking will help expel (get rid of) the air and reduce the bloated feeling in your abdomen.  No driving for 24 hours (because of the anesthesia (medicine) used during the test).  You may shower.  Do not sign any important legal documents or operate any machinery for 24 hours (because of the anesthesia used during the test).  NUTRITION Drink plenty of fluids.  You may  resume your normal diet.  Begin with a light meal and progress to your normal diet.  Avoid alcoholic beverages for 24 hours or as instructed by your caregiver.  MEDICATIONS You may resume your normal medications unless your caregiver tells you otherwise.  WHAT YOU CAN EXPECT TODAY You may experience abdominal discomfort such as a feeling of fullness or "gas" pains.  FOLLOW-UP Your doctor will discuss the results of  your test with you.  SEEK IMMEDIATE MEDICAL ATTENTION IF ANY OF THE FOLLOWING OCCUR: Excessive nausea (feeling sick to your stomach) and/or vomiting.  Severe abdominal pain and distention (swelling).  Trouble swallowing.  Temperature over 101 F (37.8 C).  Rectal bleeding or vomiting of blood.        Diverticulosis only found in your colon.  No polyps.  A future colonoscopy is not recommended unless new symptoms develop.    Your upper GI tract appeared entirely normal  Continue Linzess every day and stool softener to facilitate bowel function  Return to the office for recheck in 1 year   at patient request I called Vivi Ferns at [phone] -  rolled to voicemail.  Left a message.

## 2023-02-18 NOTE — H&P (Signed)
@LOGO @   Primary Care Physician:  Denton Meek, FNP Primary Gastroenterologist:  Dr. Jena Gauss  Pre-Procedure History & Physical: HPI:  Michelle Zavala is a 77 y.o. female here for  surveillance colonoscopy-history of colon polyps.  History of melena resolved.  Endoscopic evaluation delayed by other health issues.  Past Medical History:  Diagnosis Date   Anxiety    Arthritis    C2 cervical fracture (HCC) 09/17/2013   Traumatic fracture witth minimal displacement   Cardiomyopathy (HCC)    a. EF 35-40% by echo in 08/2021   Chronic lung disease    Fibrosis - Dr. Orson Aloe   COPD (chronic obstructive pulmonary disease) (HCC)    Coronary atherosclerosis of native coronary artery    a. Mild atherosclerosis by cath in 2010 b. NST in 07/2021 showing fixed defects but EF at 41% by NST and 35-40% by echo   Depression    Diverticulosis    Dyspnea    Essential hypertension    GERD (gastroesophageal reflux disease)    PSVT (paroxysmal supraventricular tachycardia)     Past Surgical History:  Procedure Laterality Date   ABDOMINAL HYSTERECTOMY     ANTERIOR RELEASE VERTEBRAL BODY W/ POSTERIOR FUSION     BACTERIAL OVERGROWTH TEST N/A 02/19/2016   Procedure: BACTERIAL OVERGROWTH TEST;  Surgeon: Corbin Ade, MD;  Location: [facility];  Service: Endoscopy;  Laterality: N/A;  0700   BIOPSY N/A 08/04/2015   Procedure: BIOPSY;  Surgeon: Corbin Ade, MD;  Location: [facility];  Service: Endoscopy;  Laterality: N/A;  Gastric   BIOPSY  02/21/2017   Procedure: BIOPSY;  Surgeon: Corbin Ade, MD;  Location: [facility];  Service: Endoscopy;;  ascending colon biopsy    BIOPSY  08/21/2020   Procedure: BIOPSY;  Surgeon: Corbin Ade, MD;  Location: [facility];  Service: Endoscopy;;   cataract surgery     CESAREAN SECTION     CHOLECYSTECTOMY  1973   COLONOSCOPY  June 2016   Dr. Teena Dunk: moderate diverticulosis in sigmoid colon, surveillance in 5 years    COLONOSCOPY WITH PROPOFOL N/A 02/21/2017    Dr. Jena Gauss: diverticulosis in sigmoid colon, tubular adenomas, segmental biopsies benign. Surveillance in 5 years    ESOPHAGOGASTRODUODENOSCOPY (EGD) WITH PROPOFOL N/A 08/04/2015   Dr. Viona Hosking:abnormal gastric mucosa s/p biopsy. Reactive gastropathy. Negative H.pylori   ESOPHAGOGASTRODUODENOSCOPY (EGD) WITH PROPOFOL N/A 02/21/2017   Dr. Jena Gauss: empiric esophageal dilatation, small hiatal hernia, otherwise normal   ESOPHAGOGASTRODUODENOSCOPY (EGD) WITH PROPOFOL N/A 08/21/2020   normal esophagus s/p dilation. Erythematous mucosa in stomach of doubtful clinical significant s/p biopsy. Negative H.pylori. Mild reactive gastropathy.    HIP PINNING,CANNULATED Left 06/11/2022   Procedure: PERCUTANEOUS FIXATION OF FEMORAL NECK;  Surgeon: Vickki Hearing, MD;  Location: [facility];  Service: Orthopedics;  Laterality: Left;   INCISIONAL HERNIA REPAIR N/A 12/03/2015   Procedure: Sherald Hess HERNIORRHAPHY WITH MESH;  Surgeon: Franky Macho, MD;  Location: [facility];  Service: General;  Laterality: N/A;   INSERTION OF MESH  12/03/2015   Procedure: INSERTION OF MESH;  Surgeon: Franky Macho, MD;  Location: [facility];  Service: General;;   IR RADIOLOGIST EVAL & MGMT  09/22/2017   MALONEY DILATION N/A 02/21/2017   Procedure: Elease Hashimoto DILATION;  Surgeon: Corbin Ade, MD;  Location: [facility];  Service: Endoscopy;  Laterality: N/A;   MALONEY DILATION N/A 08/21/2020   Procedure: Elease Hashimoto DILATION;  Surgeon: Corbin Ade, MD;  Location: [facility];  Service: Endoscopy;  Laterality: N/A;  POLYPECTOMY  02/21/2017   Procedure: POLYPECTOMY;  Surgeon: Corbin Ade, MD;  Location: [facility];  Service: Endoscopy;;  hepatic flexure polyp cs   RIGHT/LEFT HEART CATH AND CORONARY ANGIOGRAPHY N/A 09/17/2021   Procedure: RIGHT/LEFT HEART CATH AND CORONARY ANGIOGRAPHY;  Surgeon: Yvonne Kendall, MD;  Location: [facility];  Service: Cardiovascular;  Laterality: N/A;   TMJ ARTHROSCOPY Bilateral 02/04/2015   Procedure:  BILATERAL TEMPOROMANDIBULAR JOINT (TMJ) ARTHROSCOPY MENISECTOMY WITH FAT GRAFT FROM ABDOMEN ;  Surgeon: Ocie Doyne, DDS;  Location: [facility];  Service: Oral Surgery;  Laterality: Bilateral;   TOTAL KNEE ARTHROPLASTY Bilateral    TUBAL LIGATION      Prior to Admission medications   Medication Sig Start Date End Date Taking? Authorizing Provider  albuterol (VENTOLIN HFA) 108 (90 Base) MCG/ACT inhaler Inhale 2 puffs into the lungs every 4 (four) hours as needed for wheezing or shortness of breath.   Yes [provider]  amitriptyline (ELAVIL) 75 MG tablet Take 75 mg by mouth at bedtime. 01/27/15  Yes [provider]  Cyanocobalamin (VITAMIN B-12) 5000 MCG TBDP Take 1 tablet by mouth daily.   Yes [provider]  hydrochlorothiazide (HYDRODIURIL) 25 MG tablet TAKE ONE TABLET BY MOUTH EVERY MORNING 08/23/22  Yes Jonelle Sidle, MD  ipratropium-albuterol (DUONEB) 0.5-2.5 (3) MG/3ML SOLN Take 3 mLs by nebulization every 6 (six) hours as needed for up to 60 doses. 09/15/22  Yes Beatty, Celeste A, PA-C  LORazepam (ATIVAN) 1 MG tablet Take 1 mg by mouth 2 (two) times daily. 10/22/22  Yes [provider]  meclizine (ANTIVERT) 25 MG tablet Take 25 mg by mouth 3 (three) times daily. 10/19/22  Yes [provider]  metoprolol succinate (TOPROL-XL) 50 MG 24 hr tablet Take 50 mg (1 tablet) in the am, and 25 mg (1/2 tablet) in the pm 01/06/23  Yes Jonelle Sidle, MD  pantoprazole (PROTONIX) 40 MG tablet Take 40 mg by mouth 2 (two) times daily. 07/30/22  Yes [provider]  potassium chloride SA (KLOR-CON M) 20 MEQ tablet Take 2 tablets (40 mEq total) by mouth daily. 09/15/21  Yes Strader, [place] M, PA-C  sertraline (ZOLOFT) 100 MG tablet Take 100 mg by mouth daily.   Yes [provider]  acetaminophen (TYLENOL) 650 MG CR tablet Take 1,300 mg by mouth every 8 (eight) hours as needed for pain.     [provider]  benzonatate (TESSALON) 100 MG  capsule Take 1 capsule (100 mg total) by mouth every 8 (eight) hours. 12/27/22   Netta Corrigan, PA-C  levocetirizine (XYZAL) 5 MG tablet Take 5 mg by mouth at bedtime. 11/01/22   [provider]  ondansetron (ZOFRAN) 4 MG tablet Take 4 mg by mouth every 8 (eight) hours as needed for nausea. 10/25/22   [provider]    Allergies as of 01/11/2023 - Review Complete 01/11/2023  Allergen Reaction Noted   Sulfonamide derivatives Hives    Atorvastatin  07/29/2022   Crestor [rosuvastatin]  07/29/2022   Entresto [sacubitril-valsartan]  07/29/2022   Farxiga [dapagliflozin]  07/29/2022    Family History  Problem Relation Age of Onset   Coronary artery disease Father        Premature   Heart disease Father    Coronary artery disease Brother        Premature   Coronary artery disease Sister        Premature   Colon cancer Son     Social  History   Socioeconomic History   Marital status: Widowed    Spouse name: Not on file   Number of children: 3   Years of education: 7   Highest education level: 7th grade  Occupational History   Occupation: Retired  Tobacco Use   Smoking status: Never    Passive exposure: Never   Smokeless tobacco: Never  Vaping Use   Vaping Use: Never used  Substance and Sexual Activity   Alcohol use: Not Currently    Comment: occasionally   Drug use: No   Sexual activity: Not Currently    Partners: Male    Birth control/protection: None, Surgical  Other Topics Concern   Not on file  Social History Narrative   Occasionally drinks [employer]    Social Determinants of Health   Financial Resource Strain: Low Risk  (04/22/2022)   Overall Financial Resource Strain (CARDIA)    Difficulty of Paying Living Expenses: Not hard at all  Food Insecurity: No Food Insecurity (04/22/2022)   Hunger Vital Sign    Worried About Running Out of Food in the Last Year: Never true    Ran Out of Food in the Last Year: Never true  Transportation Needs: No  Transportation Needs (04/22/2022)   PRAPARE - Administrator, Civil Service (Medical): No    Lack of Transportation (Non-Medical): No  Physical Activity: Inactive (04/22/2022)   Exercise Vital Sign    Days of Exercise per Week: 0 days    Minutes of Exercise per Session: 0 min  Stress: Stress Concern Present (04/22/2022)   [employer] of Occupational Health - Occupational Stress Questionnaire    Feeling of Stress : To some extent  Social Connections: Moderately Isolated (04/22/2022)   Social Connection and Isolation Panel [NHANES]    Frequency of Communication with Friends and Family: Three times a week    Frequency of Social Gatherings with Friends and Family: Three times a week    Attends Religious Services: More than 4 times per year    Active Member of Clubs or Organizations: No    Attends Banker Meetings: Never    Marital Status: Widowed  Intimate Partner Violence: Not At Risk (04/22/2022)   Humiliation, Afraid, Rape, and Kick questionnaire    Fear of Current or Ex-Partner: No    Emotionally Abused: No    Physically Abused: No    Sexually Abused: No    Review of Systems: See HPI, otherwise negative ROS  Physical Exam: BP (!) 163/82   Resp 20   Ht 5\' 3"  (1.6 m)   Wt 71.7 kg   SpO2 98%   BMI 28.00 kg/m  General:   Alert,  Well-developed, well-nourished, pleasant and cooperative in NAD Neck:  Supple; no masses or thyromegaly. No significant cervical adenopathy. Lungs:  Clear throughout to auscultation.   No wheezes, crackles, or rhonchi. No acute distress. Heart:  Regular rate and rhythm; no murmurs, clicks, rubs,  or gallops. Abdomen: Non-distended, normal bowel sounds.  Soft and nontender without appreciable mass or hepatosplenomegaly.   Impression/Plan:    77 year old lady here for surveillance colonoscopy-history of polyps.  History of melena self-limiting sometime ago.  No dysphagia.  Here for diagnostic EGD as well per original plan. The  risks, benefits, limitations, imponderables and alternatives regarding both EGD and colonoscopy have been reviewed with the patient. Questions have been answered. All parties agreeable.       Notice: This dictation was prepared with Dragon dictation along with smaller phrase  technology. Any transcriptional errors that result from this process are unintentional and may not be corrected upon review.

## 2023-02-18 NOTE — Transfer of Care (Signed)
Immediate Anesthesia Transfer of Care Note  Patient: Michelle Zavala  Procedure(s) Performed: COLONOSCOPY WITH PROPOFOL ESOPHAGOGASTRODUODENOSCOPY (EGD) WITH PROPOFOL  Patient Location: PACU  Anesthesia Type:General  Level of Consciousness: drowsy and patient cooperative  Airway & Oxygen Therapy: Patient Spontanous Breathing and Patient connected to face mask oxygen  Post-op Assessment: Report given to RN and Post -op Vital signs reviewed and stable  Post vital signs: Reviewed and stable  Last Vitals:  Vitals Value Taken Time  BP    Temp    Pulse    Resp    SpO2      Last Pain:  Vitals:   02/18/23 0904  PainSc: 0-No pain      Patients Stated Pain Goal: 5 (02/18/23 0815)  Complications: No notable events documented.

## 2023-02-23 DIAGNOSIS — M6281 Muscle weakness (generalized): Secondary | ICD-10-CM | POA: Diagnosis not present

## 2023-02-23 DIAGNOSIS — R42 Dizziness and giddiness: Secondary | ICD-10-CM | POA: Diagnosis not present

## 2023-02-23 DIAGNOSIS — R2689 Other abnormalities of gait and mobility: Secondary | ICD-10-CM | POA: Diagnosis not present

## 2023-02-23 DIAGNOSIS — J449 Chronic obstructive pulmonary disease, unspecified: Secondary | ICD-10-CM | POA: Diagnosis not present

## 2023-02-23 DIAGNOSIS — S72012D Unspecified intracapsular fracture of left femur, subsequent encounter for closed fracture with routine healing: Secondary | ICD-10-CM | POA: Diagnosis not present

## 2023-02-25 ENCOUNTER — Encounter: Payer: Self-pay | Admitting: Orthopedic Surgery

## 2023-02-25 ENCOUNTER — Other Ambulatory Visit (INDEPENDENT_AMBULATORY_CARE_PROVIDER_SITE_OTHER): Payer: 59

## 2023-02-25 ENCOUNTER — Ambulatory Visit (INDEPENDENT_AMBULATORY_CARE_PROVIDER_SITE_OTHER): Payer: 59 | Admitting: Orthopedic Surgery

## 2023-02-25 VITALS — BP 125/71 | HR 71 | Ht 63.0 in | Wt 155.0 lb

## 2023-02-25 DIAGNOSIS — M25511 Pain in right shoulder: Secondary | ICD-10-CM

## 2023-02-25 DIAGNOSIS — M12811 Other specific arthropathies, not elsewhere classified, right shoulder: Secondary | ICD-10-CM

## 2023-02-25 DIAGNOSIS — G8929 Other chronic pain: Secondary | ICD-10-CM

## 2023-02-25 DIAGNOSIS — M25552 Pain in left hip: Secondary | ICD-10-CM

## 2023-02-25 DIAGNOSIS — M5136 Other intervertebral disc degeneration, lumbar region: Secondary | ICD-10-CM

## 2023-02-25 DIAGNOSIS — Z9889 Other specified postprocedural states: Secondary | ICD-10-CM | POA: Diagnosis not present

## 2023-02-25 MED ORDER — ACETAMINOPHEN-CODEINE 300-30 MG PO TABS
1.0000 | ORAL_TABLET | Freq: Four times a day (QID) | ORAL | 0 refills | Status: DC | PRN
Start: 2023-02-25 — End: 2023-08-10

## 2023-02-25 MED ORDER — ACETAMINOPHEN-CODEINE 300-30 MG PO TABS
1.0000 | ORAL_TABLET | Freq: Four times a day (QID) | ORAL | 0 refills | Status: DC | PRN
Start: 2023-02-25 — End: 2023-02-25

## 2023-02-25 MED ORDER — METHYLPREDNISOLONE ACETATE 40 MG/ML IJ SUSP
40.0000 mg | Freq: Once | INTRAMUSCULAR | Status: AC
  Administered 2023-02-25: 40 mg via INTRA_ARTICULAR

## 2023-02-25 NOTE — Progress Notes (Signed)
Chief Complaint  Patient presents with   Shoulder Pain    Pain in right shoulder also pain in left hip    77 year old female with chronic right shoulder pain we have given her AN INJX for that  I believe, anyway she has intermittent pain in the right shoulder sometimes severe she has intermittent loss of motion with crepitus cracking and popping   Exam of the shoulder 10 degrees external rotation with the arm at her side Belly press test normal strength  Passive flexion 150 degrees active 110 degrees in the scapular with the arm abducted to 90 degrees external  Crepitance and pain with range of motion  Last x-ray shows humerus articulating with the acromion   Status post left hip surgery fixation percutaneous screws September of 2023 She  fell 6 weeks ago landed on her back and her gait is very altered with Trendelenburg type gait  The pain and tenderness is located over the left gr trochanter   IMAGING:  X-ray will be done of the left hip  She opted for injections right shoulder  Encounter Diagnoses  Name Primary?   Pain in left hip Yes   Chronic right shoulder pain    Rotator cuff arthropathy of right shoulder    DDD (degenerative disc disease), lumbar    Status post hip surgery crpp 05/2022     Assessment and plan   Procedure note the subacromial injection shoulder RIGHT    Verbal consent was obtained to inject the  RIGHT   Shoulder  Timeout was completed to confirm the injection site is a subacromial space of the  RIGHT  shoulder   Medication used Depo-Medrol 40 mg and lidocaine 1% 3 cc  Anesthesia was provided by ethyl chloride  The injection was performed in the RIGHT  posterior subacromial space. After pinning the skin with alcohol and anesthetized the skin with ethyl chloride the subacromial space was injected using a 20-gauge needle. There were no complications  Sterile dressing was applied.  Meds ordered this encounter  Medications    methylPREDNISolone acetate (DEPO-MEDROL) injection 40 mg   acetaminophen-codeine (TYLENOL #3) 300-30 MG tablet    Sig: Take 1 tablet by mouth every 6 (six) hours as needed for moderate pain.    Dispense:  30 tablet    Refill:  0

## 2023-02-25 NOTE — Addendum Note (Signed)
Addended by: Fuller Canada E on: 02/25/2023 11:07 AM   Modules accepted: Orders

## 2023-03-09 ENCOUNTER — Ambulatory Visit (INDEPENDENT_AMBULATORY_CARE_PROVIDER_SITE_OTHER): Payer: 59 | Admitting: Orthopaedic Surgery

## 2023-03-09 ENCOUNTER — Encounter: Payer: Self-pay | Admitting: Orthopaedic Surgery

## 2023-03-09 VITALS — Ht 63.0 in | Wt 155.0 lb

## 2023-03-09 DIAGNOSIS — H6090 Unspecified otitis externa, unspecified ear: Secondary | ICD-10-CM | POA: Diagnosis not present

## 2023-03-09 DIAGNOSIS — M7062 Trochanteric bursitis, left hip: Secondary | ICD-10-CM | POA: Insufficient documentation

## 2023-03-09 DIAGNOSIS — N390 Urinary tract infection, site not specified: Secondary | ICD-10-CM | POA: Diagnosis not present

## 2023-03-09 DIAGNOSIS — I10 Essential (primary) hypertension: Secondary | ICD-10-CM | POA: Diagnosis not present

## 2023-03-09 DIAGNOSIS — Z299 Encounter for prophylactic measures, unspecified: Secondary | ICD-10-CM | POA: Diagnosis not present

## 2023-03-09 DIAGNOSIS — R350 Frequency of micturition: Secondary | ICD-10-CM | POA: Diagnosis not present
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
## 2023-03-10 MED ORDER — BUPIVACAINE HCL 0.25 % IJ SOLN
2.0000 mL | INTRAMUSCULAR | Status: AC | PRN
Start: 2023-03-09 — End: 2023-03-09
  Administered 2023-03-09: 2 mL via INTRA_ARTICULAR

## 2023-03-10 MED ORDER — METHYLPREDNISOLONE ACETATE 40 MG/ML IJ SUSP
40.0000 mg | INTRAMUSCULAR | Status: AC | PRN
Start: 2023-03-09 — End: 2023-03-09
  Administered 2023-03-09: 40 mg via INTRA_ARTICULAR

## 2023-03-10 MED ORDER — LIDOCAINE HCL 1 % IJ SOLN
1.0000 mL | INTRAMUSCULAR | Status: AC | PRN
Start: 2023-03-09 — End: 2023-03-09
  Administered 2023-03-09: 1 mL

## 2023-03-21 DIAGNOSIS — N390 Urinary tract infection, site not specified: Secondary | ICD-10-CM | POA: Diagnosis not present

## 2023-03-22 DIAGNOSIS — H6123 Impacted cerumen, bilateral: Secondary | ICD-10-CM | POA: Diagnosis not present

## 2023-03-23 DIAGNOSIS — Z299 Encounter for prophylactic measures, unspecified: Secondary | ICD-10-CM | POA: Diagnosis not present

## 2023-03-23 DIAGNOSIS — I471 Supraventricular tachycardia, unspecified: Secondary | ICD-10-CM | POA: Diagnosis not present

## 2023-03-23 DIAGNOSIS — I10 Essential (primary) hypertension: Secondary | ICD-10-CM | POA: Diagnosis not present

## 2023-03-23 DIAGNOSIS — M25551 Pain in right hip: Secondary | ICD-10-CM | POA: Diagnosis not present

## 2023-03-25 DIAGNOSIS — S72012D Unspecified intracapsular fracture of left femur, subsequent encounter for closed fracture with routine healing: Secondary | ICD-10-CM | POA: Diagnosis not present

## 2023-03-25 DIAGNOSIS — R42 Dizziness and giddiness: Secondary | ICD-10-CM | POA: Diagnosis not present

## 2023-03-25 DIAGNOSIS — R2689 Other abnormalities of gait and mobility: Secondary | ICD-10-CM | POA: Diagnosis not present

## 2023-03-25 DIAGNOSIS — J449 Chronic obstructive pulmonary disease, unspecified: Secondary | ICD-10-CM | POA: Diagnosis not present

## 2023-03-25 DIAGNOSIS — M6281 Muscle weakness (generalized): Secondary | ICD-10-CM | POA: Diagnosis not present

## 2023-03-30 ENCOUNTER — Ambulatory Visit (INDEPENDENT_AMBULATORY_CARE_PROVIDER_SITE_OTHER): Payer: 59 | Admitting: Orthopaedic Surgery

## 2023-03-30 ENCOUNTER — Encounter: Payer: Self-pay | Admitting: Orthopaedic Surgery

## 2023-03-30 VITALS — Ht 63.0 in | Wt 155.0 lb

## 2023-03-30 DIAGNOSIS — M25511 Pain in right shoulder: Secondary | ICD-10-CM

## 2023-03-30 DIAGNOSIS — M7062 Trochanteric bursitis, left hip: Secondary | ICD-10-CM

## 2023-03-30 DIAGNOSIS — M25551 Pain in right hip: Secondary | ICD-10-CM

## 2023-03-30 DIAGNOSIS — G8929 Other chronic pain: Secondary | ICD-10-CM | POA: Diagnosis not present

## 2023-03-30 DIAGNOSIS — M25552 Pain in left hip: Secondary | ICD-10-CM | POA: Diagnosis not present

## 2023-03-30 DIAGNOSIS — M19011 Primary osteoarthritis, right shoulder: Secondary | ICD-10-CM
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
## 2023-03-31 DIAGNOSIS — M19011 Primary osteoarthritis, right shoulder: Secondary | ICD-10-CM | POA: Insufficient documentation

## 2023-04-01 ENCOUNTER — Telehealth: Payer: Self-pay

## 2023-04-01 NOTE — Telephone Encounter (Signed)
Patient wants to hold off on shoulder surgery right now.  She wants to focus on her hip and getting that better first.

## 2023-04-01 NOTE — Telephone Encounter (Signed)
noted 

## 2023-04-05 DIAGNOSIS — S3993XA Unspecified injury of pelvis, initial encounter: Secondary | ICD-10-CM | POA: Diagnosis not present

## 2023-04-05 DIAGNOSIS — Z882 Allergy status to sulfonamides status: Secondary | ICD-10-CM | POA: Diagnosis not present

## 2023-04-05 DIAGNOSIS — I129 Hypertensive chronic kidney disease with stage 1 through stage 4 chronic kidney disease, or unspecified chronic kidney disease: Secondary | ICD-10-CM | POA: Diagnosis not present

## 2023-04-05 DIAGNOSIS — Z79899 Other long term (current) drug therapy: Secondary | ICD-10-CM | POA: Diagnosis not present

## 2023-04-05 DIAGNOSIS — R569 Unspecified convulsions: Secondary | ICD-10-CM | POA: Diagnosis not present

## 2023-04-05 DIAGNOSIS — R42 Dizziness and giddiness: Secondary | ICD-10-CM | POA: Diagnosis not present

## 2023-04-05 DIAGNOSIS — I6782 Cerebral ischemia: Secondary | ICD-10-CM | POA: Diagnosis not present

## 2023-04-05 DIAGNOSIS — S0990XA Unspecified injury of head, initial encounter: Secondary | ICD-10-CM | POA: Diagnosis not present

## 2023-04-05 DIAGNOSIS — R110 Nausea: Secondary | ICD-10-CM | POA: Diagnosis not present

## 2023-04-05 DIAGNOSIS — Z8673 Personal history of transient ischemic attack (TIA), and cerebral infarction without residual deficits: Secondary | ICD-10-CM | POA: Diagnosis not present

## 2023-04-05 DIAGNOSIS — N183 Chronic kidney disease, stage 3 unspecified: Secondary | ICD-10-CM | POA: Diagnosis not present

## 2023-04-05 DIAGNOSIS — N3000 Acute cystitis without hematuria: Secondary | ICD-10-CM | POA: Diagnosis not present

## 2023-04-05 DIAGNOSIS — J439 Emphysema, unspecified: Secondary | ICD-10-CM | POA: Diagnosis not present

## 2023-04-05 DIAGNOSIS — N390 Urinary tract infection, site not specified: Secondary | ICD-10-CM | POA: Diagnosis not present

## 2023-04-05 DIAGNOSIS — T372X5A Adverse effect of antimalarials and drugs acting on other blood protozoa, initial encounter: Secondary | ICD-10-CM | POA: Diagnosis not present

## 2023-04-05 DIAGNOSIS — N3289 Other specified disorders of bladder: Secondary | ICD-10-CM | POA: Diagnosis not present

## 2023-04-05 DIAGNOSIS — E782 Mixed hyperlipidemia: Secondary | ICD-10-CM | POA: Diagnosis not present

## 2023-04-07 DIAGNOSIS — I10 Essential (primary) hypertension: Secondary | ICD-10-CM | POA: Diagnosis not present

## 2023-04-07 DIAGNOSIS — D692 Other nonthrombocytopenic purpura: Secondary | ICD-10-CM | POA: Diagnosis not present

## 2023-04-07 DIAGNOSIS — N390 Urinary tract infection, site not specified: Secondary | ICD-10-CM | POA: Diagnosis not present

## 2023-04-07 DIAGNOSIS — Z299 Encounter for prophylactic measures, unspecified: Secondary | ICD-10-CM | POA: Diagnosis not present

## 2023-04-11 DIAGNOSIS — E785 Hyperlipidemia, unspecified: Secondary | ICD-10-CM | POA: Diagnosis not present

## 2023-04-11 DIAGNOSIS — K219 Gastro-esophageal reflux disease without esophagitis: Secondary | ICD-10-CM | POA: Diagnosis not present

## 2023-04-11 DIAGNOSIS — F32A Depression, unspecified: Secondary | ICD-10-CM | POA: Diagnosis not present

## 2023-04-11 DIAGNOSIS — M810 Age-related osteoporosis without current pathological fracture: Secondary | ICD-10-CM | POA: Diagnosis not present

## 2023-04-12 ENCOUNTER — Other Ambulatory Visit: Payer: Self-pay

## 2023-04-12 DIAGNOSIS — Z741 Need for assistance with personal care: Secondary | ICD-10-CM

## 2023-04-13 ENCOUNTER — Telehealth: Payer: Self-pay | Admitting: *Deleted

## 2023-04-13 DIAGNOSIS — R410 Disorientation, unspecified: Secondary | ICD-10-CM | POA: Diagnosis not present

## 2023-04-13 DIAGNOSIS — L309 Dermatitis, unspecified: Secondary | ICD-10-CM | POA: Diagnosis not present

## 2023-04-13 DIAGNOSIS — I429 Cardiomyopathy, unspecified: Secondary | ICD-10-CM | POA: Diagnosis not present

## 2023-04-13 DIAGNOSIS — Z299 Encounter for prophylactic measures, unspecified: Secondary | ICD-10-CM | POA: Diagnosis not present

## 2023-04-13 DIAGNOSIS — R21 Rash and other nonspecific skin eruption: Secondary | ICD-10-CM | POA: Diagnosis not present

## 2023-04-13 NOTE — Progress Notes (Signed)
  Care Coordination   Note   04/13/2023 Name: Michelle Zavala MRN: [id number] DOB: 05-16-1946  Michelle Zavala is a 77 y.o. year old female who sees Denton Meek, FNP for primary care. I reached out to [name] by phone today to offer care coordination services.  [name] was given information about Care Coordination services today including:   The Care Coordination services include support from the care team which includes your Nurse Coordinator, Clinical Social Worker, or Pharmacist.  The Care Coordination team is here to help remove barriers to the health concerns and goals most important to you. Care Coordination services are voluntary, and the patient may decline or stop services at any time by request to their care team member.   Care Coordination Consent Status: Patient agreed to services and verbal consent obtained.   Follow up plan:  Telephone appointment with care coordination team member scheduled for:  04/20/23  Encounter Outcome:  Pt. Scheduled  [facility] Coordination Care Guide  Direct Dial: [phone]

## 2023-04-13 NOTE — Progress Notes (Signed)
  Care Coordination  Outreach Note  04/13/2023 Name: Michelle Zavala MRN: [id number] DOB: 06-23-1946   Care Coordination Outreach Attempts: An unsuccessful telephone outreach was attempted today to offer the patient information about available care coordination services.  Follow Up Plan:  Additional outreach attempts will be made to offer the patient care coordination information and services.   Encounter Outcome:  No Answer  Christie Nottingham  Care Coordination Care Guide  Direct Dial: [phone]

## 2023-04-15 DIAGNOSIS — R410 Disorientation, unspecified: Secondary | ICD-10-CM | POA: Diagnosis not present

## 2023-04-20 ENCOUNTER — Ambulatory Visit: Payer: Self-pay | Admitting: Licensed Clinical Social Worker

## 2023-04-20 NOTE — Patient Outreach (Signed)
  Care Coordination   Initial Visit Note   04/20/2023 Name: Harnoor Shillington MRN: [id number] DOB: 02-Aug-1946  Elainna Jackovich is a 77 y.o. year old female who sees Denton Meek, FNP for primary care. I spoke with  [name] by phone today.  What matters to the patients health and wellness today?  Patient is sometimes sad related to managing her medical needs    Goals Addressed             This Visit's Progress    patient is sometimes sad related to managing her medical conditions and needs       Interventions:  Spoke with client about client needs Discussed program support with RN, LCSW, Pharmacist Provided counseling support for client Discussed transport needs of client. She said her daughter helps her with transport needs Discussed meal procurement. Discussed medication procurement Discussed sleeping issues.  She said she has decreased energy . Discussed medical support . She said she has appointment with FNP next week. Discussed pain issues of client. She spoke of shoulder pain issues. Discussed fall history of client Encouraged client to call LCSW as needed for SW support at [phone]. Encouraged client to engage in program support as needed.        SDOH assessments and interventions completed:  Yes  SDOH Interventions Today    Flowsheet Row Most Recent Value  SDOH Interventions   Depression Interventions/Treatment  Counseling  Physical Activity Interventions Other (Comments)  [may have mobility challenges]  Stress Interventions Provide Counseling  [client has stress in managing medical needs]        Care Coordination Interventions:  Yes, provided   Interventions Today    Flowsheet Row Most Recent Value  Chronic Disease   Chronic disease during today's visit Other  [spoke with client about client needs]  General Interventions   General Interventions Discussed/Reviewed General Interventions Discussed, Community Resources  [discussed program support]   Exercise Interventions   Exercise Discussed/Reviewed Physical Activity  Education Interventions   Education Provided Provided Education  Provided Verbal Education On [employer]  Mental Health Interventions   Mental Health Discussed/Reviewed Coping Strategies, Anxiety  [discussed mood issues. She said she sometimes is sad related to managing her medical needs]  Nutrition Interventions   Nutrition Discussed/Reviewed Nutrition Discussed  [she spoke of decreased appetite]  Pharmacy Interventions   Pharmacy Dicussed/Reviewed Pharmacy Topics Discussed  Safety Interventions   Safety Discussed/Reviewed Fall Risk       Follow up plan: Follow up call scheduled for 05/09/23 at 9:30 AM     Encounter Outcome:  Pt. Visit Completed   Kelton Pillar. MSW, LCSW Licensed Visual merchandiser [facility] Care Management [phone]

## 2023-04-20 NOTE — Patient Instructions (Signed)
Visit Information  Thank you for taking time to visit with me today. Please don't hesitate to contact me if I can be of assistance to you.   Following are the goals we discussed today:   Goals Addressed             This Visit's Progress    patient is sometimes sad related to managing her medical conditions and needs       Interventions:  Spoke with client about client needs Discussed program support with RN, LCSW, Pharmacist Provided counseling support for client Discussed transport needs of client. She said her daughter helps her with transport needs Discussed meal procurement. Discussed medication procurement Discussed sleeping issues.  She said she has decreased energy . Discussed medical support . She said she has appointment with FNP next week. Discussed pain issues of client. She spoke of shoulder pain issues. Discussed fall history of client Encouraged client to call LCSW as needed for SW support at [phone]. Encouraged client to engage in program support as needed.        Our next appointment is by telephone on 05/09/23 at 9:30 AM   Please call the care guide team at [phone] if you need to cancel or reschedule your appointment.   If you are experiencing a Mental Health or Behavioral Health Crisis or need someone to talk to, please go to [facility] [address], [place] [phone])   The patient verbalized understanding of instructions, educational materials, and care plan provided today and DECLINED offer to receive copy of patient instructions, educational materials, and care plan.   The patient has been provided with contact information for the care management team and has been advised to call with any health related questions or concerns.   Kelton Pillar. MSW, LCSW Licensed Visual merchandiser [facility] Care Management [phone]

## 2023-04-22 ENCOUNTER — Ambulatory Visit: Payer: 59 | Admitting: Physician Assistant

## 2023-04-22 ENCOUNTER — Ambulatory Visit: Payer: 59

## 2023-04-25 ENCOUNTER — Telehealth: Payer: Self-pay | Admitting: Orthopedic Surgery

## 2023-04-25 DIAGNOSIS — M6281 Muscle weakness (generalized): Secondary | ICD-10-CM | POA: Diagnosis not present

## 2023-04-25 DIAGNOSIS — S72012D Unspecified intracapsular fracture of left femur, subsequent encounter for closed fracture with routine healing: Secondary | ICD-10-CM | POA: Diagnosis not present

## 2023-04-25 DIAGNOSIS — R2689 Other abnormalities of gait and mobility: Secondary | ICD-10-CM | POA: Diagnosis not present

## 2023-04-25 DIAGNOSIS — R42 Dizziness and giddiness: Secondary | ICD-10-CM | POA: Diagnosis not present

## 2023-04-25 DIAGNOSIS — J449 Chronic obstructive pulmonary disease, unspecified: Secondary | ICD-10-CM | POA: Diagnosis not present

## 2023-04-25 NOTE — Telephone Encounter (Signed)
Returned the patient's call, pt answered, lost her, called back and lvm.

## 2023-04-26 ENCOUNTER — Telehealth: Payer: Self-pay | Admitting: Orthopedic Surgery

## 2023-04-26 NOTE — Telephone Encounter (Signed)
Returned the patient's from 8/12, lvm for her to call us back.

## 2023-04-29 ENCOUNTER — Telehealth: Payer: Self-pay | Admitting: Orthopedic Surgery

## 2023-04-29 NOTE — Telephone Encounter (Signed)
Returned the patient's call, lvm for her to call us back.

## 2023-05-02 DIAGNOSIS — R110 Nausea: Secondary | ICD-10-CM | POA: Diagnosis not present

## 2023-05-02 DIAGNOSIS — R410 Disorientation, unspecified: Secondary | ICD-10-CM | POA: Diagnosis not present

## 2023-05-02 DIAGNOSIS — I10 Essential (primary) hypertension: Secondary | ICD-10-CM | POA: Diagnosis not present

## 2023-05-02 DIAGNOSIS — Z299 Encounter for prophylactic measures, unspecified: Secondary | ICD-10-CM | POA: Diagnosis not present

## 2023-05-03 ENCOUNTER — Ambulatory Visit: Payer: 59 | Admitting: Orthopedic Surgery

## 2023-05-03 VITALS — Ht 63.0 in | Wt 155.0 lb

## 2023-05-03 DIAGNOSIS — Z9889 Other specified postprocedural states: Secondary | ICD-10-CM

## 2023-05-03 DIAGNOSIS — G8929 Other chronic pain: Secondary | ICD-10-CM

## 2023-05-03 DIAGNOSIS — M25511 Pain in right shoulder: Secondary | ICD-10-CM | POA: Diagnosis not present

## 2023-05-03 DIAGNOSIS — M5136 Other intervertebral disc degeneration, lumbar region: Secondary | ICD-10-CM | POA: Diagnosis not present

## 2023-05-03 DIAGNOSIS — M25552 Pain in left hip: Secondary | ICD-10-CM | POA: Diagnosis not present

## 2023-05-03 MED ORDER — TRAMADOL-ACETAMINOPHEN 37.5-325 MG PO TABS
1.0000 | ORAL_TABLET | ORAL | 0 refills | Status: AC | PRN
Start: 2023-05-03 — End: 2023-05-10

## 2023-05-03 NOTE — Progress Notes (Signed)
Chief Complaint  Patient presents with   Follow-up    Recheck on left hip, DOS 06-11-22.    Michelle Zavala comes in for follow-up after being seen for pain in her left hip after falling on April 05, 2023 she has pain in 2 areas 1 over the left hip and 1 over the lower back.  Her back complaints have been chronic  Review of systems also revealed chronic right shoulder pain x-rays have already been done in the past and she has rotator cuff arthropathy with proximal migration of the humerus and sacculation of the humeral head with the acromion  As far as the hip goes the pain is laterally near the surgical incision site but she is also having lower back pain more severe than her hip pain and this is increased since the fall  She did have a CT scan which I was able to review this was done on 23 July see my independent review below there was no fracture or disturbance of her cannulated screw fixation which was done September 2023  Examination  Michelle Zavala came in today walking no limp mild tenderness over the left hip more severe tenderness over the lower back no straight leg raise findings no numbness or tingling in the leg normal range of motion in the hip   Independent interpretation of the CT scan.  3 cannulated screws are in the left hip with washers noted the fracture has healed there is no new fracture she has degenerative disc disease in the lumbar spine  Recommend normal activities no surgical indications at this time she can take Ultracet every 6 as needed for pain  Right shoulder she says she is not ready to have surgery on the right shoulder which would require reverse shoulder replacement  We should also note that the patient has recently been diagnosed with dementia so that will play a factor in determining whether she is able to undergo shoulder replacement surgery.  Encounter Diagnoses  Name Primary?   Chronic right shoulder pain Yes   Status post hip surgery crpp 05/2022    Pain in left hip     DDD (degenerative disc disease), lumbar     Meds ordered this encounter  Medications   traMADol-acetaminophen (ULTRACET) 37.5-325 MG tablet    Sig: Take 1 tablet by mouth every 4 (four) hours as needed for up to 7 days.    Dispense:  42 tablet    Refill:  0

## 2023-05-04 ENCOUNTER — Other Ambulatory Visit: Payer: Self-pay

## 2023-05-04 ENCOUNTER — Other Ambulatory Visit (INDEPENDENT_AMBULATORY_CARE_PROVIDER_SITE_OTHER): Payer: 59

## 2023-05-04 ENCOUNTER — Ambulatory Visit: Payer: 59 | Admitting: Orthopedic Surgery

## 2023-05-04 DIAGNOSIS — M19011 Primary osteoarthritis, right shoulder: Secondary | ICD-10-CM

## 2023-05-04 DIAGNOSIS — M25511 Pain in right shoulder: Secondary | ICD-10-CM

## 2023-05-05 ENCOUNTER — Encounter: Payer: Self-pay | Admitting: Orthopedic Surgery
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
## 2023-05-08 MED ORDER — METHYLPREDNISOLONE ACETATE 40 MG/ML IJ SUSP
40.0000 mg | INTRAMUSCULAR | Status: AC | PRN
Start: 2023-05-04 — End: 2023-05-04
  Administered 2023-05-04: 40 mg via INTRA_ARTICULAR

## 2023-05-08 MED ORDER — LIDOCAINE HCL 1 % IJ SOLN
5.0000 mL | INTRAMUSCULAR | Status: AC | PRN
Start: 2023-05-04 — End: 2023-05-04
  Administered 2023-05-04: 5 mL

## 2023-05-08 MED ORDER — BUPIVACAINE HCL 0.5 % IJ SOLN
9.0000 mL | INTRAMUSCULAR | Status: AC | PRN
Start: 2023-05-04 — End: 2023-05-04
  Administered 2023-05-04: 9 mL via INTRA_ARTICULAR

## 2023-05-09 ENCOUNTER — Ambulatory Visit: Payer: Self-pay | Admitting: Licensed Clinical Social Worker

## 2023-05-09 NOTE — Patient Outreach (Signed)
  Care Coordination   Follow Up Visit Note   05/09/2023 Name: Michelle Zavala MRN: [id number] DOB: Dec 14, 1945  [name] is a 77 y.o. year old female who sees Denton Meek, FNP for primary care. I spoke with  [name] by phone today.  What matters to the patients health and wellness today?  Patient is sometimes said related to managing  her medical conditions and needs    Goals Addressed             This Visit's Progress    patient is sometimes sad related to managing her medical conditions and needs       Interventions:  Spoke with client via phone about her current status.  Discussed family support. Daughter is supportive Discussed program support with RN, LCSW, Pharmacist Provided counseling support for client Discussed transport needs of client. She said her daughter helps her with transport needs. Client said she has been able to go to and from her medical appointments Discussed meal procurement. Discussed medication procurement Discussed sleeping issues.  She said she has decreased energy .  She has to take periodic rest breaks Discussed pain issues of client. She spoke of shoulder pain issues. Discussed fall history of client Encouraged client to call LCSW as needed for SW support at [phone]. Encouraged client to engage in program support as needed.        SDOH assessments and interventions completed:  Yes  SDOH Interventions Today    Flowsheet Row Most Recent Value  SDOH Interventions   Depression Interventions/Treatment  Counseling  Physical Activity Interventions Other (Comments)  [mobility issues]  Stress Interventions Provide Counseling  [stress related to managing medical needs]        Care Coordination Interventions:  Yes, provided    Interventions Today    Flowsheet Row Most Recent Value  Chronic Disease   Chronic disease during today's visit Other  [spoke with client about client needs]  General Interventions   General Interventions  Discussed/Reviewed Community Resources, General Interventions Discussed  Exercise Interventions   Exercise Discussed/Reviewed Physical Activity  [some mobility issues]  Physical Activity Discussed/Reviewed Physical Activity Discussed  Education Interventions   Education Provided Provided Education  Provided Verbal Education On Community Resources  Mental Health Interventions   Mental Health Discussed/Reviewed Coping Strategies  [no mood issues noted]  Nutrition Interventions   Nutrition Discussed/Reviewed Nutrition Discussed  Pharmacy Interventions   Pharmacy Dicussed/Reviewed Pharmacy Topics Discussed  Safety Interventions   Safety Discussed/Reviewed Fall Risk       Follow up plan: Follow up call scheduled for 06/20/23 at 11:00 AM    Encounter Outcome:  Pt. Visit Completed   Kelton Pillar.Exilda Wilhite MSW, LCSW Licensed Visual merchandiser [facility] Care Management [phone]

## 2023-05-09 NOTE — Patient Instructions (Signed)
Visit Information  Thank you for taking time to visit with me today. Please don't hesitate to contact me if I can be of assistance to you.   Following are the goals we discussed today:   Goals Addressed             This Visit's Progress    patient is sometimes sad related to managing her medical conditions and needs       Interventions:  Spoke with client via phone about her current status.  Discussed family support. Daughter is supportive Discussed program support with RN, LCSW, Pharmacist Provided counseling support for client Discussed transport needs of client. She said her daughter helps her with transport needs. Client said she has been able to go to and from her medical appointments Discussed meal procurement. Discussed medication procurement Discussed sleeping issues.  She said she has decreased energy .  She has to take periodic rest breaks Discussed pain issues of client. She spoke of shoulder pain issues. Discussed fall history of client Encouraged client to call LCSW as needed for SW support at [phone]. Encouraged client to engage in program support as needed.        Our next appointment is by telephone on 06/20/23 at 11:00 AM   Please call the care guide team at [phone] if you need to cancel or reschedule your appointment.   If you are experiencing a Mental Health or Behavioral Health Crisis or need someone to talk to, please go to [facility] [address], [place] [phone])   The patient verbalized understanding of instructions, educational materials, and care plan provided today and DECLINED offer to receive copy of patient instructions, educational materials, and care plan.   The patient has been provided with contact information for the care management team and has been advised to call with any health related questions or concerns.   Kelton Pillar.Erika Slaby MSW, LCSW Licensed Visual merchandiser [facility] Care  Management [phone]

## 2023-05-11 DIAGNOSIS — J439 Emphysema, unspecified: Secondary | ICD-10-CM | POA: Diagnosis not present

## 2023-05-11 DIAGNOSIS — S0990XA Unspecified injury of head, initial encounter: Secondary | ICD-10-CM | POA: Diagnosis not present

## 2023-05-11 DIAGNOSIS — I129 Hypertensive chronic kidney disease with stage 1 through stage 4 chronic kidney disease, or unspecified chronic kidney disease: Secondary | ICD-10-CM | POA: Diagnosis not present

## 2023-05-11 DIAGNOSIS — S199XXA Unspecified injury of neck, initial encounter: Secondary | ICD-10-CM | POA: Diagnosis not present

## 2023-05-11 DIAGNOSIS — N183 Chronic kidney disease, stage 3 unspecified: Secondary | ICD-10-CM | POA: Diagnosis not present

## 2023-05-11 DIAGNOSIS — M25522 Pain in left elbow: Secondary | ICD-10-CM | POA: Diagnosis not present

## 2023-05-11 DIAGNOSIS — M47811 Spondylosis without myelopathy or radiculopathy, occipito-atlanto-axial region: Secondary | ICD-10-CM | POA: Diagnosis not present

## 2023-05-11 DIAGNOSIS — I6782 Cerebral ischemia: Secondary | ICD-10-CM | POA: Diagnosis not present

## 2023-05-11 DIAGNOSIS — Z23 Encounter for immunization: Secondary | ICD-10-CM | POA: Diagnosis not present

## 2023-05-11 DIAGNOSIS — I2510 Atherosclerotic heart disease of native coronary artery without angina pectoris: Secondary | ICD-10-CM | POA: Diagnosis not present

## 2023-05-11 DIAGNOSIS — M47812 Spondylosis without myelopathy or radiculopathy, cervical region: Secondary | ICD-10-CM | POA: Diagnosis not present

## 2023-05-11 DIAGNOSIS — E782 Mixed hyperlipidemia: Secondary | ICD-10-CM | POA: Diagnosis not present

## 2023-05-11 DIAGNOSIS — S0003XA Contusion of scalp, initial encounter: Secondary | ICD-10-CM | POA: Diagnosis not present

## 2023-05-11 DIAGNOSIS — S51012A Laceration without foreign body of left elbow, initial encounter: Secondary | ICD-10-CM | POA: Diagnosis not present

## 2023-05-11 DIAGNOSIS — S0083XA Contusion of other part of head, initial encounter: Secondary | ICD-10-CM | POA: Diagnosis not present

## 2023-05-11 DIAGNOSIS — S42402A Unspecified fracture of lower end of left humerus, initial encounter for closed fracture: Secondary | ICD-10-CM | POA: Diagnosis not present

## 2023-05-11 DIAGNOSIS — M5030 Other cervical disc degeneration, unspecified cervical region: Secondary | ICD-10-CM | POA: Diagnosis not present

## 2023-05-13 ENCOUNTER — Ambulatory Visit: Payer: 59 | Admitting: Cardiology

## 2023-05-18 DIAGNOSIS — Z299 Encounter for prophylactic measures, unspecified: Secondary | ICD-10-CM | POA: Diagnosis not present

## 2023-05-18 DIAGNOSIS — M545 Low back pain, unspecified: Secondary | ICD-10-CM | POA: Diagnosis not present

## 2023-05-18 DIAGNOSIS — R8290 Unspecified abnormal findings in urine: Secondary | ICD-10-CM | POA: Diagnosis not present

## 2023-05-18 DIAGNOSIS — R296 Repeated falls: Secondary | ICD-10-CM | POA: Diagnosis not present

## 2023-05-18 DIAGNOSIS — I10 Essential (primary) hypertension: Secondary | ICD-10-CM | POA: Diagnosis not present

## 2023-05-20 ENCOUNTER — Other Ambulatory Visit: Payer: Self-pay | Admitting: Cardiology

## 2023-05-23 ENCOUNTER — Encounter: Payer: Self-pay | Admitting: Physician Assistant

## 2023-05-23 ENCOUNTER — Encounter: Payer: 59 | Admitting: Orthopedic Surgery

## 2023-06-02 DIAGNOSIS — I2510 Atherosclerotic heart disease of native coronary artery without angina pectoris: Secondary | ICD-10-CM | POA: Diagnosis not present

## 2023-06-02 DIAGNOSIS — R339 Retention of urine, unspecified: Secondary | ICD-10-CM | POA: Diagnosis not present

## 2023-06-02 DIAGNOSIS — N1330 Unspecified hydronephrosis: Secondary | ICD-10-CM | POA: Diagnosis not present

## 2023-06-02 DIAGNOSIS — J439 Emphysema, unspecified: Secondary | ICD-10-CM | POA: Diagnosis not present

## 2023-06-02 DIAGNOSIS — E782 Mixed hyperlipidemia: Secondary | ICD-10-CM | POA: Diagnosis not present

## 2023-06-02 DIAGNOSIS — K5730 Diverticulosis of large intestine without perforation or abscess without bleeding: Secondary | ICD-10-CM | POA: Diagnosis not present

## 2023-06-02 DIAGNOSIS — I129 Hypertensive chronic kidney disease with stage 1 through stage 4 chronic kidney disease, or unspecified chronic kidney disease: Secondary | ICD-10-CM | POA: Diagnosis not present

## 2023-06-02 DIAGNOSIS — N3289 Other specified disorders of bladder: Secondary | ICD-10-CM | POA: Diagnosis not present

## 2023-06-02 DIAGNOSIS — Z882 Allergy status to sulfonamides status: Secondary | ICD-10-CM | POA: Diagnosis not present

## 2023-06-02 DIAGNOSIS — Z79899 Other long term (current) drug therapy: Secondary | ICD-10-CM | POA: Diagnosis not present

## 2023-06-02 DIAGNOSIS — N183 Chronic kidney disease, stage 3 unspecified: Secondary | ICD-10-CM | POA: Diagnosis not present

## 2023-06-09 ENCOUNTER — Telehealth: Payer: Self-pay | Admitting: *Deleted

## 2023-06-09 NOTE — Progress Notes (Signed)
Care Coordination Note  06/09/2023 Name: Michelle Zavala MRN: [id number] DOB: [birth year]  Michelle Zavala is a 77 y.o. year old female who is a primary care patient of Denton Meek, FNP and is actively engaged with the care management team. I reached out to [name] by phone today to assist with re-scheduling a follow up visit with the Licensed Clinical Social Worker  Follow up plan: Unsuccessful telephone outreach attempt made. A HIPAA compliant phone message was left for the patient providing contact information and requesting a return call.   [facility]  Care Coordination Care Guide  Direct Dial: [phone]

## 2023-06-13 NOTE — Progress Notes (Signed)
Care Coordination Note  06/13/2023 Name: Serenitie Kamei MRN: [id number] DOB: [birth year]  Corissa Puccini is a 77 y.o. year old female who is a primary care patient of Denton Meek, FNP and is actively engaged with the care management team. I reached out to [name] by phone today to assist with re-scheduling a follow up visit with the Licensed Clinical Social Worker  Follow up plan: Unsuccessful telephone outreach attempt made. A HIPAA compliant phone message was left for the patient providing contact information and requesting a return call.   [facility]  Care Coordination Care Guide  Direct Dial: [phone]

## 2023-06-17 NOTE — Progress Notes (Signed)
Care Coordination Note  06/17/2023 Name: Michelle Zavala MRN: [id number] DOB: 11-03-1945  Michelle Zavala is a 77 y.o. year old female who is a primary care patient of Denton Meek, FNP and is actively engaged with the care management team. I reached out to [name] by phone today to assist with re-scheduling a follow up visit with the Licensed Clinical Social Worker  Follow up plan: Unsuccessful telephone outreach attempt made. A HIPAA compliant phone message was left for the patient providing contact information and requesting a return call.  We have been unable to make contact with the patient for follow up. The care management team is available to follow up with the patient after provider conversation with the patient regarding recommendation for care management engagement and subsequent re-referral to the care management team.   [facility] Coordination Care Guide  Direct Dial: [phone]

## 2023-06-20 ENCOUNTER — Encounter: Payer: 59 | Admitting: Licensed Clinical Social Worker

## 2023-06-27 ENCOUNTER — Encounter: Payer: Self-pay | Admitting: Orthopedic Surgery

## 2023-06-27 ENCOUNTER — Other Ambulatory Visit (INDEPENDENT_AMBULATORY_CARE_PROVIDER_SITE_OTHER): Payer: 59

## 2023-06-27 ENCOUNTER — Ambulatory Visit (INDEPENDENT_AMBULATORY_CARE_PROVIDER_SITE_OTHER): Payer: 59 | Admitting: Orthopedic Surgery

## 2023-06-27 VITALS — BP 133/77 | HR 98 | Ht 63.0 in | Wt 152.0 lb

## 2023-06-27 DIAGNOSIS — M7062 Trochanteric bursitis, left hip: Secondary | ICD-10-CM | POA: Diagnosis not present

## 2023-06-27 DIAGNOSIS — M25552 Pain in left hip: Secondary | ICD-10-CM | POA: Diagnosis not present

## 2023-06-27 MED ORDER — METHYLPREDNISOLONE ACETATE 40 MG/ML IJ SUSP
40.0000 mg | Freq: Once | INTRAMUSCULAR | Status: AC
Start: 2023-06-27 — End: 2023-06-27
  Administered 2023-06-27: 40 mg via INTRA_ARTICULAR

## 2023-06-27 NOTE — Progress Notes (Signed)
New problem  Pain left hip after fall August 28.  Presented to [facility] emergency room for fall no evidence of hip issue at that time it seems like they worked her up for the fall with a closed head injury rule out elbow fracture face contusion  Comes in today with pain over the left hip and says that it moves talking about over the incision when she walks  She does have a limp but has been persistent since her surgery  [facility] for fracture September 17, 2021  She is able to ambulate she has a limp hip range of motion is normal you can palpate tissue moving over the greater trochanter is probably bursal tissue from the screws  Imaging 3 screws are noted in the left hip without fracture we do see arthritis in the lower part of the back  Appears to have may be some bursal irritation over the left hip I gave her an injection for bursitis follow-up as needed  Procedure note injection for left hip bursitis  Verbal consent was obtained for injection of the  left hip   Timeout was completed to confirm the injection site  The medications used were 40 mg of Depo-Medrol and 1% lidocaine 3 cc  Anesthesia was provided by ethyl chloride and the skin was prepped with alcohol.  After cleaning the skin with alcohol a 25-gauge needle was used to inject the left hip greater trochanteric bursa

## 2023-06-28 ENCOUNTER — Encounter: Payer: 59 | Admitting: Licensed Clinical Social Worker

## 2023-07-20 DIAGNOSIS — I10 Essential (primary) hypertension: Secondary | ICD-10-CM | POA: Diagnosis not present

## 2023-07-20 DIAGNOSIS — R296 Repeated falls: Secondary | ICD-10-CM | POA: Diagnosis not present

## 2023-07-20 DIAGNOSIS — Z299 Encounter for prophylactic measures, unspecified: Secondary | ICD-10-CM | POA: Diagnosis not present

## 2023-07-23 DIAGNOSIS — R030 Elevated blood-pressure reading, without diagnosis of hypertension: Secondary | ICD-10-CM | POA: Diagnosis not present

## 2023-07-23 DIAGNOSIS — M542 Cervicalgia: Secondary | ICD-10-CM | POA: Diagnosis not present

## 2023-07-27 ENCOUNTER — Other Ambulatory Visit (INDEPENDENT_AMBULATORY_CARE_PROVIDER_SITE_OTHER): Payer: 59

## 2023-07-27 ENCOUNTER — Ambulatory Visit (INDEPENDENT_AMBULATORY_CARE_PROVIDER_SITE_OTHER): Payer: 59 | Admitting: Physician Assistant

## 2023-07-27 ENCOUNTER — Encounter: Payer: Self-pay | Admitting: Physician Assistant

## 2023-07-27 VITALS — BP 134/80 | HR 83 | Resp 18 | Ht 63.0 in | Wt 152.0 lb

## 2023-07-27 DIAGNOSIS — R413 Other amnesia: Secondary | ICD-10-CM

## 2023-07-27 LAB — VITAMIN B12: Vitamin B-12: >1537 pg/mL — ABNORMAL HIGH (ref 211–911)

## 2023-07-27 LAB — TSH: TSH: 2.45 u[IU]/mL (ref 0.35–5.50)

## 2023-07-27 LAB — VITAMIN D 25 HYDROXY (VIT D DEFICIENCY, FRACTURES): VITD: 52.05 ng/mL (ref 30.00–100.00)
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
## 2023-07-27 NOTE — Patient Instructions (Addendum)
It was a pleasure to see you today at our office.   Recommendations:   MRI of the brain, the radiology office will call you to arrange you appointment  [phone] Check labs today  [address] Follow up in 2 months Consider adult Day Program or moving to Independent liveng      For psychiatric meds, mood meds: Please have your primary care physician manage these medications.  If you have any severe symptoms of a stroke, or other severe issues such as confusion,severe chills or fever, etc call 911 or go to the ER as you may need to be evaluated further   For guidance regarding [facility] and if placement were needed at the facility, contact Social Worker tel: [phone]  For assessment of decision of mental capacity and competency:  Call Dr. Erick Blinks, geriatric psychiatrist at [phone]  Counseling regarding caregiver distress, including caregiver depression, anxiety and issues regarding community resources, adult day care programs, adult living facilities, or memory care questions:  please contact your  Primary Doctor's Social Worker   Whom to call: Memory  decline, memory medications: Call our office [phone]    [email or web address]   RECOMMENDATIONS FOR ALL PATIENTS WITH MEMORY PROBLEMS: 1. Continue to exercise (Recommend 30 minutes of walking everyday, or 3 hours every week) 2. Increase social interactions - continue going to [place] and enjoy social gatherings with friends and family 3. Eat healthy, avoid fried foods and eat more fruits and vegetables 4. Maintain adequate blood pressure, blood sugar, and blood cholesterol level. Reducing the risk of stroke and cardiovascular disease also helps promoting better memory. 5. Avoid stressful situations. Live a simple life and avoid aggravations. Organize your time and prepare for the next day in anticipation. 6. Sleep well, avoid any interruptions  of sleep and avoid any distractions in the bedroom that may interfere with adequate sleep quality 7. Avoid sugar, avoid sweets as there is a strong link between excessive sugar intake, diabetes, and cognitive impairment We discussed the Mediterranean diet, which has been shown to help patients reduce the risk of progressive memory disorders and reduces cardiovascular risk. This includes eating fish, eat fruits and green leafy vegetables, nuts like almonds and hazelnuts, walnuts, and also use olive oil. Avoid fast foods and fried foods as much as possible. Avoid sweets and sugar as sugar use has been linked to worsening of memory function.  There is always a concern of gradual progression of memory problems. If this is the case, then we may need to adjust level of care according to patient needs. Support, both to the patient and caregiver, should then be put into place.        DRIVING: Regarding driving, in patients with progressive memory problems, driving will be impaired. We advise to have someone else do the driving if trouble finding directions or if minor accidents are reported. Independent driving assessment is available to determine safety of driving.   If you are interested in the driving assessment, you can contact the following:  [employer] in [place] [phone]  [facility] [phone]  [facility] [phone]  [facility] [phone] or [phone]   FALL PRECAUTIONS: Be cautious when walking. Scan the area for obstacles that may increase the risk of trips and falls. When getting up in the mornings, sit up at the edge of the bed for a few minutes before getting out of bed. Consider elevating the bed at the head end to avoid drop of blood  pressure when getting up. Walk always in a well-lit room (use night lights in the walls). Avoid area rugs or power cords from appliances in the middle of the walkways. Use a walker or a cane  if necessary and consider physical therapy for balance exercise. Get your eyesight checked regularly.  FINANCIAL OVERSIGHT: Supervision, especially oversight when making financial decisions or transactions is also recommended.  HOME SAFETY: Consider the safety of the kitchen when operating appliances like stoves, microwave oven, and blender. Consider having supervision and share cooking responsibilities until no longer able to participate in those. Accidents with firearms and other hazards in the house should be identified and addressed as well.   ABILITY TO BE LEFT ALONE: If patient is unable to contact 911 operator, consider using LifeLine, or when the need is there, arrange for someone to stay with patients. Smoking is a fire hazard, consider supervision or cessation. Risk of wandering should be assessed by caregiver and if detected at any point, supervision and safe proof recommendations should be instituted.  MEDICATION SUPERVISION: Inability to self-administer medication needs to be constantly addressed. Implement a mechanism to ensure safe administration of the medications.      Mediterranean Diet A Mediterranean diet refers to food and lifestyle choices that are based on the traditions of countries located on the [employer]. This way of eating has been shown to help prevent certain conditions and improve outcomes for people who have chronic diseases, like kidney disease and heart disease. What are tips for following this plan? Lifestyle  Cook and eat meals together with your family, when possible. Drink enough fluid to keep your urine clear or pale yellow. Be physically active every day. This includes: Aerobic exercise like running or swimming. Leisure activities like gardening, walking, or housework. Get 7-8 hours of sleep each night. If recommended by your health care provider, drink red wine in moderation. This means 1 glass a day for nonpregnant women and 2 glasses a day  for men. A glass of wine equals 5 oz (150 mL). Reading food labels  Check the serving size of packaged foods. For foods such as rice and pasta, the serving size refers to the amount of cooked product, not dry. Check the total fat in packaged foods. Avoid foods that have saturated fat or trans fats. Check the ingredients list for added sugars, such as corn syrup. Shopping  At the grocery store, buy most of your food from the areas near the walls of the store. This includes: Fresh fruits and vegetables (produce). Grains, beans, nuts, and seeds. Some of these may be available in unpackaged forms or large amounts (in bulk). Fresh seafood. Poultry and eggs. Low-fat dairy products. Buy whole ingredients instead of prepackaged foods. Buy fresh fruits and vegetables in-season from local farmers markets. Buy frozen fruits and vegetables in resealable bags. If you do not have access to quality fresh seafood, buy precooked frozen shrimp or canned fish, such as tuna, salmon, or sardines. Buy small amounts of raw or cooked vegetables, salads, or olives from the deli or salad bar at your store. Stock your pantry so you always have certain foods on hand, such as olive oil, canned tuna, canned tomatoes, rice, pasta, and beans. Cooking  Cook foods with extra-virgin olive oil instead of using butter or other vegetable oils. Have meat as a side dish, and have vegetables or grains as your main dish. This means having meat in small portions or adding small amounts of meat to foods like pasta  or stew. Use beans or vegetables instead of meat in common dishes like chili or lasagna. Experiment with different cooking methods. Try roasting or broiling vegetables instead of steaming or sauteing them. Add frozen vegetables to soups, stews, pasta, or rice. Add nuts or seeds for added healthy fat at each meal. You can add these to yogurt, salads, or vegetable dishes. Marinate fish or vegetables using olive oil, lemon  juice, garlic, and fresh herbs. Meal planning  Plan to eat 1 vegetarian meal one day each week. Try to work up to 2 vegetarian meals, if possible. Eat seafood 2 or more times a week. Have healthy snacks readily available, such as: Vegetable sticks with hummus. Greek yogurt. Fruit and nut trail mix. Eat balanced meals throughout the week. This includes: Fruit: 2-3 servings a day Vegetables: 4-5 servings a day Low-fat dairy: 2 servings a day Fish, poultry, or lean meat: 1 serving a day Beans and legumes: 2 or more servings a week Nuts and seeds: 1-2 servings a day Whole grains: 6-8 servings a day Extra-virgin olive oil: 3-4 servings a day Limit red meat and sweets to only a few servings a month What are my food choices? Mediterranean diet Recommended Grains: Whole-grain pasta. Brown rice. Bulgar wheat. Polenta. Couscous. Whole-wheat bread. Orpah Cobb. Vegetables: Artichokes. Beets. Broccoli. Cabbage. Carrots. Eggplant. Green beans. Chard. Kale. Spinach. Onions. Leeks. Peas. Squash. Tomatoes. Peppers. Radishes. Fruits: Apples. Apricots. Avocado. Berries. Bananas. Cherries. Dates. Figs. Grapes. Lemons. Melon. Oranges. Peaches. Plums. Pomegranate. Meats and other protein foods: Beans. Almonds. Sunflower seeds. Pine nuts. Peanuts. Cod. Salmon. Scallops. Shrimp. Tuna. Tilapia. Clams. Oysters. Eggs. Dairy: Low-fat milk. Cheese. Greek yogurt. Beverages: Water. Red wine. Herbal tea. Fats and oils: Extra virgin olive oil. Avocado oil. Grape seed oil. Sweets and desserts: [place] yogurt with honey. Baked apples. Poached pears. Trail mix. Seasoning and other foods: Basil. Cilantro. Coriander. Cumin. Mint. Parsley. Sage. Rosemary. Tarragon. Garlic. Oregano. Thyme. Pepper. Balsalmic vinegar. Tahini. Hummus. Tomato sauce. Olives. Mushrooms. Limit these Grains: Prepackaged pasta or rice dishes. Prepackaged cereal with added sugar. Vegetables: Deep fried potatoes (french fries). Fruits: Fruit  canned in syrup. Meats and other protein foods: Beef. Pork. Lamb. Poultry with skin. Hot dogs. Tomasa Blase. Dairy: Ice cream. Sour cream. Whole milk. Beverages: Juice. Sugar-sweetened soft drinks. Beer. Liquor and spirits. Fats and oils: Butter. Canola oil. Vegetable oil. Beef fat (tallow). Lard. Sweets and desserts: Cookies. Cakes. Pies. Candy. Seasoning and other foods: Mayonnaise. Premade sauces and marinades. The items listed may not be a complete list. Talk with your dietitian about what dietary choices are right for you. Summary The Mediterranean diet includes both food and lifestyle choices. Eat a variety of fresh fruits and vegetables, beans, nuts, seeds, and whole grains. Limit the amount of red meat and sweets that you eat. Talk with your health care provider about whether it is safe for you to drink red wine in moderation. This means 1 glass a day for nonpregnant women and 2 glasses a day for men. A glass of wine equals 5 oz (150 mL). This information is not intended to replace advice given to you by your health care provider. Make sure you discuss any questions you have with your health care provider. Document Released: 04/22/2016 Document Revised: 05/25/2016 Document Reviewed: 04/22/2016 Elsevier Interactive Patient Education  2017 [employer].

## 2023-07-27 NOTE — Progress Notes (Signed)
Labs are normal, B12 is way more than normal, can hold B12 for 2 months and follow up with PCP. Thanks

## 2023-08-03 DIAGNOSIS — Z299 Encounter for prophylactic measures, unspecified: Secondary | ICD-10-CM | POA: Diagnosis not present

## 2023-08-03 DIAGNOSIS — I10 Essential (primary) hypertension: Secondary | ICD-10-CM | POA: Diagnosis not present

## 2023-08-03 DIAGNOSIS — R0981 Nasal congestion: Secondary | ICD-10-CM | POA: Diagnosis not present

## 2023-08-04 ENCOUNTER — Telehealth: Payer: Self-pay | Admitting: Physician Assistant

## 2023-08-04 NOTE — Telephone Encounter (Signed)
Pt's daughter called in stating the pt fell 2 days ago, he is stating she is not going to go to her MRI appt, and she has been driving when she isn't supposed to. The pt's daughter is asking to speak with Neysa Bonito

## 2023-08-04 NOTE — Telephone Encounter (Signed)
Left message at 10:49 08/04/2023

## 2023-08-05 DIAGNOSIS — J449 Chronic obstructive pulmonary disease, unspecified: Secondary | ICD-10-CM | POA: Diagnosis not present

## 2023-08-05 DIAGNOSIS — R5383 Other fatigue: Secondary | ICD-10-CM | POA: Diagnosis not present

## 2023-08-05 DIAGNOSIS — I10 Essential (primary) hypertension: Secondary | ICD-10-CM | POA: Diagnosis not present

## 2023-08-05 DIAGNOSIS — R0602 Shortness of breath: Secondary | ICD-10-CM | POA: Diagnosis not present

## 2023-08-05 DIAGNOSIS — I5022 Chronic systolic (congestive) heart failure: Secondary | ICD-10-CM | POA: Diagnosis not present

## 2023-08-05 DIAGNOSIS — K219 Gastro-esophageal reflux disease without esophagitis: Secondary | ICD-10-CM | POA: Diagnosis not present

## 2023-08-05 DIAGNOSIS — Z299 Encounter for prophylactic measures, unspecified: Secondary | ICD-10-CM | POA: Diagnosis not present

## 2023-08-05 DIAGNOSIS — Z79899 Other long term (current) drug therapy: Secondary | ICD-10-CM | POA: Diagnosis not present

## 2023-08-05 DIAGNOSIS — I429 Cardiomyopathy, unspecified: Secondary | ICD-10-CM | POA: Diagnosis not present

## 2023-08-05 NOTE — Telephone Encounter (Signed)
Has an MRI, calling to check status of date and time.I advised of schedule

## 2023-08-07 ENCOUNTER — Ambulatory Visit
Admission: RE | Admit: 2023-08-07 | Discharge: 2023-08-07 | Disposition: A | Discharge status: Home / Self Care | Payer: 59 | Source: Ambulatory Visit | Attending: Physician Assistant | Admitting: Physician Assistant

## 2023-08-07 DIAGNOSIS — R2689 Other abnormalities of gait and mobility: Secondary | ICD-10-CM | POA: Diagnosis not present

## 2023-08-07 DIAGNOSIS — R413 Other amnesia: Secondary | ICD-10-CM | POA: Diagnosis not present

## 2023-08-07 DIAGNOSIS — I6782 Cerebral ischemia: Secondary | ICD-10-CM | POA: Diagnosis not present

## 2023-08-09 DIAGNOSIS — Z299 Encounter for prophylactic measures, unspecified: Secondary | ICD-10-CM | POA: Diagnosis not present

## 2023-08-09 DIAGNOSIS — M542 Cervicalgia: Secondary | ICD-10-CM | POA: Diagnosis not present

## 2023-08-09 DIAGNOSIS — I10 Essential (primary) hypertension: Secondary | ICD-10-CM | POA: Diagnosis not present

## 2023-08-09 DIAGNOSIS — R52 Pain, unspecified: Secondary | ICD-10-CM | POA: Diagnosis not present

## 2023-08-09 DIAGNOSIS — I5022 Chronic systolic (congestive) heart failure: Secondary | ICD-10-CM | POA: Diagnosis not present

## 2023-08-10 ENCOUNTER — Other Ambulatory Visit: Payer: Self-pay | Admitting: *Deleted

## 2023-08-10 ENCOUNTER — Encounter: Payer: Self-pay | Admitting: Cardiology

## 2023-08-10 ENCOUNTER — Ambulatory Visit: Payer: 59 | Attending: Cardiology | Admitting: Cardiology

## 2023-08-10 VITALS — BP 126/80 | HR 88 | Ht 63.0 in | Wt 147.8 lb

## 2023-08-10 DIAGNOSIS — Z79899 Other long term (current) drug therapy: Secondary | ICD-10-CM

## 2023-08-10 DIAGNOSIS — I10 Essential (primary) hypertension: Secondary | ICD-10-CM

## 2023-08-10 DIAGNOSIS — I471 Supraventricular tachycardia, unspecified: Secondary | ICD-10-CM

## 2023-08-10 DIAGNOSIS — I5020 Unspecified systolic (congestive) heart failure: Secondary | ICD-10-CM

## 2023-08-10 DIAGNOSIS — E782 Mixed hyperlipidemia: Secondary | ICD-10-CM | POA: Diagnosis not present

## 2023-08-10 MED ORDER — POTASSIUM CHLORIDE CRYS ER 20 MEQ PO TBCR: 20.0000 meq | EXTENDED_RELEASE_TABLET | ORAL | 1 refills | Status: DC

## 2023-08-10 MED ORDER — SPIRONOLACTONE 25 MG PO TABS: 25.0000 mg | ORAL_TABLET | Freq: Every day | ORAL | 1 refills | Status: DC

## 2023-08-10 MED ORDER — FUROSEMIDE 20 MG PO TABS: 20.0000 mg | ORAL_TABLET | ORAL | 1 refills | Status: DC

## 2023-08-10 MED ORDER — BISOPROLOL FUMARATE 5 MG PO TABS: 5.0000 mg | ORAL_TABLET | Freq: Every day | ORAL | 1 refills | Status: DC

## 2023-08-10 NOTE — Patient Instructions (Addendum)
Medication Instructions:  Your physician has recommended you make the following change in your medication:  Stop Toprol XL Start Bisoprolol 5 mg daily Stop chlorthalidone Start spironolactone 25 mg daily Change furosemide to 20 mg every other day Change potassium to 20 meq every other day Continue all other medications as prescribed  Labwork: BMET in 2 weeks (08/24/2023) Non-fasting [employer] or [employer] ([address] [place])  Testing/Procedures: Your physician has requested that you have an echocardiogram. Echocardiography is a painless test that uses sound waves to create images of your heart. It provides your doctor with information about the size and shape of your heart and how well your heart's chambers and valves are working. This procedure takes approximately one hour. There are no restrictions for this procedure. Please do NOT wear cologne, perfume, aftershave, or lotions (deodorant is allowed). Please arrive 15 minutes prior to your appointment time.  Please note: We ask at that you not bring children with you during ultrasound (echo/ vascular) testing. Due to room size and safety concerns, children are not allowed in the ultrasound rooms during exams. Our front office staff cannot provide observation of children in our lobby area while testing is being conducted. An adult accompanying a patient to their appointment will only be allowed in the ultrasound room at the discretion of the ultrasound technician under special circumstances. We apologize for any inconvenience.  Follow-Up: Your physician recommends that you schedule a follow-up appointment in: 2 months  Any Other Special Instructions Will Be Listed Below (If Applicable).  If you need a refill on your cardiac medications before your next appointment, please call your pharmacy.

## 2023-08-10 NOTE — Progress Notes (Signed)
Medication bottles brought back to office and reviewed. Medications reconciled and discussed with provider. Per Diona Browner, see update to orders, medication changes and adjustments below:  Stop Toprol XL Start Bisoprolol 5 mg daily Stop chlorthalidone Start spironolactone 25 mg daily Change furosemide to 20 mg every other day Change potassium to 20 meq every other day BMET in 2 weeks Follow up with provider in 2 months

## 2023-08-10 NOTE — Progress Notes (Signed)
Cardiology Office Note  Date: 08/10/2023   ID: Michelle Zavala, DOB 1945-11-02, MRN [id number]  History of Present Illness: Michelle Zavala is a 77 y.o. female last seen in April.  She is here today with family member for a follow-up visit.  Complains of chronic shortness of breath and fatigue.  Does not report any leg swelling or obvious fluid retention, in fact has been losing weight.  States that her appetite is poor.  I do not have complete information, but she states that she saw Dr. Sherryll Burger recently about her shortness of breath, had a chest x-ray done at [facility], the report of which does not indicate any acute process.  She did have a recent lab work showing a significantly elevated pro-BNP.  It sounds like she may have been started on Lasix, although I am not certain if other medication adjustments were made.  We plan to have her come back to the office to bring all of her bottles in for review.  I reviewed her current medications.  Regimen includes chlorthalidone, Lasix, Toprol-XL, and potassium supplement although we are going to plan to clarify this for sure.  She previously did not tolerate Entresto due to dizziness and also felt weak on Farxiga.  Also has preferred to avoid other lipid treatment options with multi-statin myalgias.   She has not had a recent echocardiogram.  Physical Exam: VS:  BP 126/80 (BP Location: Right Arm)   Pulse 88   Ht 5\' 3"  (1.6 m)   Wt 147 lb 12.8 oz (67 kg)   SpO2 99%   BMI 26.18 kg/m , BMI Body mass index is 26.18 kg/m.  Wt Readings from Last 3 Encounters:  08/10/23 147 lb 12.8 oz (67 kg)  07/27/23 152 lb (68.9 kg)  06/27/23 152 lb (68.9 kg)    General: Patient appears comfortable at rest. HEENT: Conjunctiva and lids normal. Neck: Supple, no elevated JVP or carotid bruits. Lungs: Decreased breath sounds throughout, no active wheezing.  Nonlabored at rest. Cardiac: Regular rate and rhythm, no S3 or significant systolic murmur. Extremities:  No pitting edema.  ECG:  An ECG dated 12/27/2022 was personally reviewed today and demonstrated:  Sinus rhythm with LVH, left anterior fascicular block.  Labwork: 10/18/2022: ALT 15; AST 24 11/09/2022: Magnesium 1.4 12/27/2022: BUN 13; Creatinine, Ser 0.80; Hemoglobin 12.4; Platelets 291; Potassium 3.6; Sodium 131 07/27/2023: TSH 2.45  November 2024: Hemoglobin 11.1, platelets 283, potassium 4.4, BUN 9, creatinine 1.02, AST 12, ALT 10, pro-BNP 10461  Other Studies Reviewed Today:  Echocardiogram 12/23/2021 [facility]): Summary   1. The left ventricle is mildly dilated in size with mildly increased wall  thickness.   2. The left ventricular systolic function is mildly to moderately decreased,  LVEF is visually estimated at 35-40%.    3. There is grade I diastolic dysfunction (impaired relaxation).    4. There is mild mitral valve regurgitation.    5. The aortic valve is trileaflet with mildly thickened leaflets with normal  excursion.   6. There is mild to moderate aortic regurgitation.    7. The left atrium is mildly to moderately dilated in size.    8. The right ventricle is normal in size, with normal systolic function.    9. IVC size and inspiratory change suggest mildly elevated right atrial  pressure. (5-10 mmHg).   Assessment and Plan:  1.  HFrEF with LVEF 35 to 40% by echocardiogram in April 2023 at [facility], nonischemic cardiomyopathy with cardiac catheterization  in January 2023 demonstrating mild to moderate nonobstructive CAD.  She presents complaining of chronic dyspnea on exertion, NYHA class II-III which is most likely multifactorial.  Recent chest x-ray showed no acute process.  Pro-BNP was significantly abnormal however.  I do not have complete information regarding her recent evaluation by PCP or medication adjustments.  Plan is to have her come back to the office bringing all of her medication bottles for review.  My tentative plan is to switch her from Toprol-XL  to bisoprolol (more cardioselective) and most likely use Aldactone along with Lasix.  She has prior intolerances to [place] as discussed above.  She is also being scheduled for a follow-up echocardiogram.   2.  Primary hypertension.  Plan to make medication adjustments as discussed above.   3.  PSVT.  No complaint of palpitations or syncope.   4.  COPD.  Also contributor to her shortness of breath.   5.  Asymptomatic, ascending aortic aneurysm of 4.2 cm by chest CTA in January of this year.   6.  Mixed hyperlipidemia with statin myalgias.  LDL 177 in February 2023.  She has declined other treatment options.  Disposition:  Follow up  nurse visit for review of medications.  Signed, Jonelle Sidle, M.D., F.A.C.C. [facility]

## 2023-08-10 NOTE — Addendum Note (Signed)
Addended by: Eustace Moore on: 08/10/2023 11:05 AM   Modules accepted: Orders

## 2023-08-17 DIAGNOSIS — R52 Pain, unspecified: Secondary | ICD-10-CM | POA: Diagnosis not present

## 2023-08-17 DIAGNOSIS — M542 Cervicalgia: Secondary | ICD-10-CM | POA: Diagnosis not present

## 2023-08-17 DIAGNOSIS — M25559 Pain in unspecified hip: Secondary | ICD-10-CM | POA: Diagnosis not present

## 2023-08-17 DIAGNOSIS — Z299 Encounter for prophylactic measures, unspecified: Secondary | ICD-10-CM | POA: Diagnosis not present

## 2023-08-17 DIAGNOSIS — I10 Essential (primary) hypertension: Secondary | ICD-10-CM | POA: Diagnosis not present

## 2023-08-17 DIAGNOSIS — I5022 Chronic systolic (congestive) heart failure: Secondary | ICD-10-CM | POA: Diagnosis not present

## 2023-08-22 ENCOUNTER — Other Ambulatory Visit: Payer: 59

## 2023-08-23 DIAGNOSIS — R52 Pain, unspecified: Secondary | ICD-10-CM | POA: Diagnosis not present

## 2023-08-23 DIAGNOSIS — I10 Essential (primary) hypertension: Secondary | ICD-10-CM | POA: Diagnosis not present

## 2023-08-23 DIAGNOSIS — Z0000 Encounter for general adult medical examination without abnormal findings: Secondary | ICD-10-CM | POA: Diagnosis not present

## 2023-08-23 DIAGNOSIS — M542 Cervicalgia: Secondary | ICD-10-CM | POA: Diagnosis not present

## 2023-08-23 DIAGNOSIS — Z7189 Other specified counseling: Secondary | ICD-10-CM | POA: Diagnosis not present

## 2023-08-23 DIAGNOSIS — R5383 Other fatigue: Secondary | ICD-10-CM | POA: Diagnosis not present

## 2023-08-23 DIAGNOSIS — E7800 Pure hypercholesterolemia, unspecified: Secondary | ICD-10-CM | POA: Diagnosis not present

## 2023-08-23 DIAGNOSIS — Z299 Encounter for prophylactic measures, unspecified: Secondary | ICD-10-CM | POA: Diagnosis not present

## 2023-08-30 ENCOUNTER — Ambulatory Visit: Payer: 59 | Attending: Cardiology

## 2023-08-30 DIAGNOSIS — I5020 Unspecified systolic (congestive) heart failure: Secondary | ICD-10-CM | POA: Diagnosis not present

## 2023-08-31 ENCOUNTER — Other Ambulatory Visit: Payer: Self-pay | Admitting: Gastroenterology

## 2023-09-02 DIAGNOSIS — M25511 Pain in right shoulder: Secondary | ICD-10-CM | POA: Diagnosis not present

## 2023-09-02 DIAGNOSIS — Z299 Encounter for prophylactic measures, unspecified: Secondary | ICD-10-CM | POA: Diagnosis not present

## 2023-09-02 DIAGNOSIS — I10 Essential (primary) hypertension: Secondary | ICD-10-CM | POA: Diagnosis not present

## 2023-09-02 DIAGNOSIS — R52 Pain, unspecified: Secondary | ICD-10-CM | POA: Diagnosis not present

## 2023-09-02 DIAGNOSIS — M542 Cervicalgia: Secondary | ICD-10-CM | POA: Diagnosis not present

## 2023-09-02 DIAGNOSIS — M25111 Fistula, right shoulder: Secondary | ICD-10-CM | POA: Diagnosis not present

## 2023-09-19 ENCOUNTER — Encounter (HOSPITAL_COMMUNITY): Payer: Self-pay

## 2023-09-19 ENCOUNTER — Emergency Department (HOSPITAL_COMMUNITY)
Admission: EM | Admit: 2023-09-19 | Discharge: 2023-09-19 | Disposition: A | Discharge status: Home / Self Care | Payer: 59 | Attending: Student | Admitting: Student

## 2023-09-19 ENCOUNTER — Other Ambulatory Visit: Payer: Self-pay

## 2023-09-19 ENCOUNTER — Emergency Department (HOSPITAL_COMMUNITY): Payer: 59

## 2023-09-19 DIAGNOSIS — M25552 Pain in left hip: Secondary | ICD-10-CM | POA: Diagnosis not present

## 2023-09-19 DIAGNOSIS — R079 Chest pain, unspecified: Secondary | ICD-10-CM | POA: Diagnosis not present

## 2023-09-19 DIAGNOSIS — Z743 Need for continuous supervision: Secondary | ICD-10-CM | POA: Diagnosis not present

## 2023-09-19 DIAGNOSIS — N183 Chronic kidney disease, stage 3 unspecified: Secondary | ICD-10-CM | POA: Insufficient documentation

## 2023-09-19 DIAGNOSIS — I719 Aortic aneurysm of unspecified site, without rupture: Secondary | ICD-10-CM | POA: Diagnosis not present

## 2023-09-19 DIAGNOSIS — I499 Cardiac arrhythmia, unspecified: Secondary | ICD-10-CM | POA: Diagnosis not present

## 2023-09-19 DIAGNOSIS — I129 Hypertensive chronic kidney disease with stage 1 through stage 4 chronic kidney disease, or unspecified chronic kidney disease: Secondary | ICD-10-CM | POA: Insufficient documentation

## 2023-09-19 DIAGNOSIS — J479 Bronchiectasis, uncomplicated: Secondary | ICD-10-CM | POA: Diagnosis not present

## 2023-09-19 DIAGNOSIS — R6889 Other general symptoms and signs: Secondary | ICD-10-CM | POA: Diagnosis not present

## 2023-09-19 DIAGNOSIS — R1110 Vomiting, unspecified: Secondary | ICD-10-CM | POA: Diagnosis not present

## 2023-09-19 DIAGNOSIS — I2510 Atherosclerotic heart disease of native coronary artery without angina pectoris: Secondary | ICD-10-CM | POA: Diagnosis not present

## 2023-09-19 DIAGNOSIS — R002 Palpitations: Secondary | ICD-10-CM | POA: Diagnosis not present

## 2023-09-19 DIAGNOSIS — R0789 Other chest pain: Secondary | ICD-10-CM | POA: Diagnosis present

## 2023-09-19 DIAGNOSIS — X58XXXD Exposure to other specified factors, subsequent encounter: Secondary | ICD-10-CM | POA: Diagnosis not present

## 2023-09-19 DIAGNOSIS — S22000D Wedge compression fracture of unspecified thoracic vertebra, subsequent encounter for fracture with routine healing: Secondary | ICD-10-CM | POA: Diagnosis not present

## 2023-09-19 DIAGNOSIS — J449 Chronic obstructive pulmonary disease, unspecified: Secondary | ICD-10-CM | POA: Diagnosis not present

## 2023-09-19 DIAGNOSIS — I7121 Aneurysm of the ascending aorta, without rupture: Secondary | ICD-10-CM

## 2023-09-19 DIAGNOSIS — I700 Atherosclerosis of aorta: Secondary | ICD-10-CM | POA: Diagnosis not present

## 2023-09-19 DIAGNOSIS — R058 Other specified cough: Secondary | ICD-10-CM | POA: Diagnosis not present

## 2023-09-19 DIAGNOSIS — R918 Other nonspecific abnormal finding of lung field: Secondary | ICD-10-CM | POA: Diagnosis not present

## 2023-09-19 DIAGNOSIS — J90 Pleural effusion, not elsewhere classified: Secondary | ICD-10-CM | POA: Diagnosis not present

## 2023-09-19 LAB — CBC
HCT: 34.1 % — ABNORMAL LOW (ref 36.0–46.0)
Hemoglobin: 11.1 g/dL — ABNORMAL LOW (ref 12.0–15.0)
MCH: 29.1 pg (ref 26.0–34.0)
MCHC: 32.6 g/dL (ref 30.0–36.0)
MCV: 89.5 fL (ref 80.0–100.0)
Platelets: 334 10*3/uL (ref 150–400)
RBC: 3.81 MIL/uL — ABNORMAL LOW (ref 3.87–5.11)
RDW: 14.3 % (ref 11.5–15.5)
WBC: 10.7 10*3/uL — ABNORMAL HIGH (ref 4.0–10.5)
nRBC: 0 % (ref 0.0–0.2)

## 2023-09-19 LAB — BASIC METABOLIC PANEL
Anion gap: 8 (ref 5–15)
BUN: 18 mg/dL (ref 8–23)
CO2: 20 mmol/L — ABNORMAL LOW (ref 22–32)
Calcium: 9.2 mg/dL (ref 8.9–10.3)
Chloride: 107 mmol/L (ref 98–111)
Creatinine, Ser: 0.94 mg/dL (ref 0.44–1.00)
GFR, Estimated: >60 mL/min (ref >60)
Glucose, Bld: 142 mg/dL — ABNORMAL HIGH (ref 70–99)
Potassium: 4 mmol/L (ref 3.5–5.1)
Sodium: 135 mmol/L (ref 135–145)

## 2023-09-19 LAB — TROPONIN I (HIGH SENSITIVITY)
Troponin I (High Sensitivity): 10 ng/L (ref <18)
Troponin I (High Sensitivity): 9 ng/L (ref <18)

## 2023-09-19 MED ORDER — ACETAMINOPHEN 325 MG PO TABS
650.0000 mg | ORAL_TABLET | Freq: Once | ORAL | Status: AC
Start: 2023-09-19 — End: 2023-09-19
  Administered 2023-09-19: 650 mg via ORAL
  Filled 2023-09-19: qty 2

## 2023-09-19 MED ORDER — IOHEXOL 350 MG/ML SOLN
75.0000 mL | Freq: Once | INTRAVENOUS | Status: AC | PRN
  Administered 2023-09-19: 75 mL via INTRAVENOUS

## 2023-09-19 MED ORDER — IOHEXOL 350 MG/ML SOLN
75.0000 mL | Freq: Once | INTRAVENOUS | Status: AC | PRN
  Administered 2023-09-19: 60 mL via INTRAVENOUS

## 2023-09-19 MED ORDER — LIDOCAINE 5 % EX PTCH
1.0000 | MEDICATED_PATCH | CUTANEOUS | Status: DC
  Administered 2023-09-19: 1 via TRANSDERMAL
  Filled 2023-09-19: qty 1

## 2023-09-19 NOTE — ED Provider Notes (Signed)
 Patient taken in shift hand off here for CP, neck, hip, back. No pain currently . Nodule on CXR. Cta pending. If neg. Dc/ fu pcp. Taken at shift handoff form PA Silver Physical Exam  BP 124/89 (BP Location: Right Arm)   Pulse 99   Temp (!) 97.5 F (36.4 C) (Oral)   Resp 18   Ht 5' 3 (1.6 m)   Wt 66.7 kg   SpO2 99%   BMI 26.04 kg/m   Physical Exam  Procedures  Procedures  ED Course / MDM    Medical Decision Making Amount and/or Complexity of Data Reviewed Labs: ordered. Radiology: ordered.  Risk OTC drugs. Prescription drug management.   78 year old female taken in signout. Visualized and interpreted CT angiogram of the chest.  CT negative for acute PE however concerned about her aorta.  Radiology reached out to Dr. Francesca about CT aortogram study.  I visualized and interpreted this study.  She has an ascending thoracic aortic aneurysm 4 cm no evidence of dissection, no other significant findings.  I discussed the case with the patient at bedside.  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  This may be one of the reasons she has pain there.  I also addressed imaging findings of hydronephrosis incompletely visualized.  This can be done as an outpatient as she is not having any flank pain at this time or urinary symptoms.  Patient appears otherwise appropriate for discharge.  Discussed return precautions.       Arloa Chroman, PA-C 09/19/23 2349    Francesca Elsie CROME, MD 09/24/23 1929

## 2023-09-19 NOTE — ED Provider Triage Note (Signed)
 Emergency Medicine Provider Triage Evaluation Note  Michelle Zavala , a 78 y.o. female  was evaluated in triage.  Pt with multiple complaints.  Reports left-sided chest pain that was present last night.  States that she was laying down and felt discomfort in her left lower chest.  Denies shortness of breath, cough, fever or radiation of pain.  That she woke up and the pain was not there.  Also complaining of right-sided neck pain.  States that symptoms have been present intermittently for the past 6 months or so.  States that she has a rub at home that she puts on it the tends to help.  Also complaining of right shoulder pain that has been present for the past 2 months.  States that she supposed to have surgery wants a second opinion.  Also complaining of left hip pain.  States that she had surgical intervention on her left hip and fell 2 weeks ago and has had slight pain since then.  Denies any recent falls within the past 2 weeks.  Review of Systems  Positive: See above Negative:   Physical Exam  BP 124/89 (BP Location: Right Arm)   Pulse 99   Temp (!) 97.5 F (36.4 C) (Oral)   Resp 18   Ht 5' 3 (1.6 m)   Wt 66.7 kg   SpO2 99%   BMI 26.04 kg/m  Gen:   Awake, no distress   Resp:  Normal effort  MSK:   Moves extremities without difficulty  Other:    Medical Decision Making  Medically screening exam initiated at 2:04 PM.  Appropriate orders placed.  Zellie Jenning was informed that the remainder of the evaluation will be completed by another provider, this initial triage assessment does not replace that evaluation, and the importance of remaining in the ED until their evaluation is complete.     Silver Wonda LABOR, [place] 09/19/23 1406

## 2023-09-19 NOTE — ED Triage Notes (Signed)
 Pt BIB ems for heart palpitations. Per pt intermittent chest pains for a month. Pt states her main pain today is in the back of her head. Pt states head has been hurting "awhile" as well.

## 2023-09-19 NOTE — Discharge Instructions (Signed)
 You have a thoracic aortic aneurysm.  You will need repeat imaging of your chest every year to continue to monitor it.  You may establish as a patient with thoracic surgery and monitor this with your primary care doctor.  One of your kidneys is slightly larger.  You will need to follow-up with your primary care doctor for dedicated imaging  Take Tylenol  for your pain.  To help if you have any new or worsening issues.

## 2023-09-19 NOTE — ED Provider Notes (Signed)
 [place] EMERGENCY DEPARTMENT AT [facility] Provider Note   CSN: [id number] Arrival date & time: 09/19/23  1238     History  Chief Complaint  Patient presents with   Chest Pain    Michelle Zavala is a 78 y.o. female.   Chest Pain   78 year old female presents emergency department with complaints of  left-sided chest pain that was present last night. States that she was laying down and felt discomfort in her left lower chest. Denies shortness of breath, cough, fever or radiation of pain. That she woke up and the pain was not there. Also complaining of right-sided neck pain. States that symptoms have been present intermittently for the past 6 months or so. States that she has a rub at home that she puts on it the tends to help. Also complaining of right shoulder pain that has been present for the past 2 months. States that she supposed to have surgery wants a second opinion. Also complaining of left hip pain. States that she had surgical intervention on her left hip and fell 2 weeks ago and has had slight pain since then. Denies any recent falls within the past 2 weeks.   Past medical history significant for cardiomyopathy, COPD, CAD, hypertension, GERD, paroxysmal ventricular tachycardia, hyperlipidemia, CKD 3, GERD  Home Medications Prior to Admission medications   Medication Sig Start Date End Date Taking? Authorizing Provider  acetaminophen  (TYLENOL ) 650 MG CR tablet Take 1,300 mg by mouth every 8 (eight) hours as needed for pain.     [provider]  albuterol  (VENTOLIN  HFA) 108 (90 Base) MCG/ACT inhaler Inhale 2 puffs into the lungs every 4 (four) hours as needed for wheezing or shortness of breath.    [provider]  amitriptyline  (ELAVIL ) 75 MG tablet Take 75 mg by mouth at bedtime. 01/27/15   [provider]  bisoprolol  (ZEBETA ) 5 MG tablet Take 1 tablet (5 mg total) by mouth daily. 08/10/23   Debera Jayson MATSU, MD  furosemide  (LASIX ) 20 MG  tablet Take 1 tablet (20 mg total) by mouth every other day. 08/10/23   Debera Jayson MATSU, MD  ipratropium (ATROVENT ) 0.02 % nebulizer solution Take 0.5 mg by nebulization. 08/09/23   [provider]  ipratropium-albuterol  (DUONEB) 0.5-2.5 (3) MG/3ML SOLN Take 3 mLs by nebulization every 6 (six) hours as needed for up to 60 doses. 09/15/22   Suellen Cantor A, PA-C  levocetirizine (XYZAL) 5 MG tablet Take 5 mg by mouth at bedtime. 11/01/22   [provider]  linaclotide  (LINZESS ) 145 MCG CAPS capsule TAKE ONE CAPSULE BY MOUTH DAILY BEFORE BREAKFAST 08/31/23   Rourk, Lamar HERO, MD  LORazepam  (ATIVAN ) 1 MG tablet Take 1 mg by mouth 2 (two) times daily. 10/22/22   [provider]  Magnesium  250 MG TABS Take 1 tablet by mouth daily.    [provider]  Multiple Vitamins-Minerals (PRESERVISION/LUTEIN PO) Take 1 tablet by mouth 2 (two) times daily.    [provider]  naproxen  (NAPROSYN ) 250 MG tablet SMARTSIG:1 Tablet(s) By Mouth Every 12 Hours PRN 04/18/23   [provider]  ondansetron  (ZOFRAN ) 4 MG tablet Take 4 mg by mouth every 8 (eight) hours as needed for nausea. 10/25/22   [provider]  pantoprazole  (PROTONIX ) 40 MG tablet Take 40 mg by mouth 2 (two) times daily. 07/30/22   [provider]  potassium chloride  SA (KLOR-CON  M) 20 MEQ tablet Take 1 tablet (20 mEq total) by mouth every other day.  08/10/23   Debera Jayson MATSU, MD  sertraline  (ZOLOFT ) 100 MG tablet Take 100 mg by mouth 2 (two) times daily.    [provider]  spironolactone  (ALDACTONE ) 25 MG tablet Take 1 tablet (25 mg total) by mouth daily. 08/10/23   Debera Jayson MATSU, MD      Allergies    Sulfonamide derivatives, Atorvastatin , Crestor  [rosuvastatin ], Entresto  [sacubitril-valsartan], and Farxiga  [dapagliflozin ]    Review of Systems   Review of Systems  Cardiovascular:  Positive for chest pain.  All other systems reviewed and are negative.   Physical  Exam Updated Vital Signs BP 124/89 (BP Location: Right Arm)   Pulse 99   Temp (!) 97.5 F (36.4 C) (Oral)   Resp 18   Ht 5' 3 (1.6 m)   Wt 66.7 kg   SpO2 99%   BMI 26.04 kg/m  Physical Exam Vitals and nursing note reviewed.  Constitutional:      General: She is not in acute distress.    Appearance: She is well-developed.  HENT:     Head: Normocephalic and atraumatic.  Eyes:     Conjunctiva/sclera: Conjunctivae normal.  Cardiovascular:     Rate and Rhythm: Normal rate and regular rhythm.     Pulses: Normal pulses.  Pulmonary:     Effort: Pulmonary effort is normal. No respiratory distress.     Breath sounds: Normal breath sounds. No wheezing, rhonchi or rales.  Chest:     Chest wall: No tenderness.  Abdominal:     Palpations: Abdomen is soft.     Tenderness: There is no abdominal tenderness.  Musculoskeletal:        General: No swelling.     Cervical back: Neck supple.     Right lower leg: No edema.     Left lower leg: No edema.  Skin:    General: Skin is warm and dry.     Capillary Refill: Capillary refill takes less than 2 seconds.  Neurological:     Mental Status: She is alert.  Psychiatric:        Mood and Affect: Mood normal.     ED Results / Procedures / Treatments   Labs (all labs ordered are listed, but only abnormal results are displayed) Labs Reviewed  BASIC METABOLIC PANEL - Abnormal; Notable for the following components:      Result Value   CO2 20 (*)    Glucose, Bld 142 (*)    All other components within normal limits  CBC - Abnormal; Notable for the following components:   WBC 10.7 (*)    RBC 3.81 (*)    Hemoglobin 11.1 (*)    HCT 34.1 (*)    All other components within normal limits  TROPONIN I (HIGH SENSITIVITY)  TROPONIN I (HIGH SENSITIVITY)    EKG None  Radiology DG Hip Unilat W or Wo Pelvis 2-3 Views Left Result Date: 09/19/2023 CLINICAL DATA:  Pain EXAM: DG HIP (WITH OR WITHOUT PELVIS) 2-3V LEFT COMPARISON:  Left hip x-ray  06/27/2023. FINDINGS: There is no evidence of hip fracture or dislocation. Three left-sided hip screws are present, unchanged from prior. IMPRESSION: 1. No acute fracture or dislocation. 2. Stable left-sided hip screws. Electronically Signed   By: Greig Pique M.D.   On: 09/19/2023 15:56   DG Chest 2 View Result Date: 09/19/2023 CLINICAL DATA:  Productive cough and chest pains x 2 weeks. Hx of copd. EXAM: CHEST - 2 VIEW COMPARISON:  Chest x-ray 08/05/2023, CT chest 09/15/2022. FINDINGS: The  heart and mediastinal contours are within normal limits. Tortuous descending thoracic aorta. Atherosclerotic plaque. Query nodular density of on the left lateral lower lobe. No pulmonary edema. Nonspecific blunting of the costophrenic angle on the left. No definite pleural effusion. No pneumothorax. No acute osseous abnormality.  Cervical spine surgical hardware IMPRESSION: 1. Query nodular density of on the left lateral lower lobe. Nonspecific blunting at the left costophrenic angle. Followup PA and lateral chest X-ray is recommended in 3-4 weeks and exclude underlying malignancy. 2.  Aortic Atherosclerosis (ICD10-I70.0). Electronically Signed   By: Morgane  Naveau M.D.   On: 09/19/2023 14:29    Procedures Procedures    Medications Ordered in ED Medications  lidocaine  (LIDODERM ) 5 % 1 patch (1 patch Transdermal Patch Applied 09/19/23 1531)  acetaminophen  (TYLENOL ) tablet 650 mg (650 mg Oral Given 09/19/23 1530)    ED Course/ Medical Decision Making/ A&P                                 Medical Decision Making Amount and/or Complexity of Data Reviewed Labs: ordered. Radiology: ordered.  Risk OTC drugs. Prescription drug management.   This patient presents to the ED for concern of chest pain, neck pain, this involves an extensive number of treatment options, and is a complaint that carries with it a high risk of complications and morbidity.  The differential diagnosis includes ACS, PE, pneumothorax,  pneumonia, malignancy, or dissection and aortic aneurysm, malignancy, meningitis, fracture, strain/sprain, dislocation, ischemic limb, carotid artery/vertebral artery dissection, other   Co morbidities that complicate the patient evaluation  See HPI   Additional history obtained:  Additional history obtained from EMR External records from outside source obtained and reviewed including hospital record   Lab Tests:  I Ordered, and personally interpreted labs.  The pertinent results include: Leukocytosis of 10.7.  Anemia with a hemoglobin of 11.1.  Platelets within range.  Decreased bicarb of 20 but otherwise, chest within limits.  No renal dysfunction.  Initial troponin of 10 with repeat 9   Imaging Studies ordered:  I ordered imaging studies including chest x-ray, hip x-ray, CT angio chest PE, CT cervical spine  I independently visualized and interpreted imaging which showed  Chest x-ray: Nodular density left lateral lower lobe.  Nonspecific blunting of left costophrenic angle.  Aortic atherosclerosis. Pelvis with left hip x-ray: No acute abnormality CT of your chest PE: Pending I agree with the radiologist interpretation  Cardiac Monitoring: / EKG:  The patient was maintained on a cardiac monitor.  I personally viewed and interpreted the cardiac monitored which showed an underlying rhythm of: Sinus rhythm.  Left anterior fascicular block.  LVH.  No significant change from EKG performed 12/27/22  Consultations Obtained:  N/a   Problem List / ED Course / Critical interventions / Medication management  Chest pain, neck pain, left hip pain I ordered medication including Lidoderm  and Tylenol   Reevaluation of the patient after these medicines showed that the patient improved I have reviewed the patients home medicines and have made adjustments as needed   Social Determinants of Health:  Denies tobacco, licit drug use.   Test / Admission - Considered:  Chest pain, neck  pain, left hip pain Vitals signs within normal range and stable throughout visit. Laboratory/imaging studies significant for: See above 78 year old female presents emergency department with several different complaints.  Most acutely, left-sided chest pain that began last night.  States she has been having similar  symptoms over the past month or so but noticed pain more acutely last night.  States she woke up and was not in pain but due to concern for the left-sided chest pain, visit to the emergency department.  Also stated that she has been having right-sided neck pain that has been intermittent over the past 6 months as well as 2 weeks of left hip pain ever since a mechanical fall landing on her left hip.  Regarding left hip pain, no appreciable tenderness on exam.  No weakness of lower extremities.  Patient seem to be most concerned about surgical hardware from prior intervention.  X-ray obtained which was negative.  Regarding right-sided neck pain, paraspinal tenderness noted in the cervical region.  Full range of motion of neck.  No clinical evidence of meningismus.  No pulse deficits to suggest ischemic limb.  No obvious weakness or sensory deficits in upper extremities concerning for spinal cord compression/impingement.  Suspect muscular strain right paraspinal cervical region.  Treated with Lidoderm  Tylenol  and noted improvement.  Will recommend the same in the outpatient setting.  Regarding left-sided chest pain, no real chest pain initially on exam with palpation of left-sided chest wall.  Delta negative troponin, lack of acute ischemic change on EKG; low suspicion for ACS.  Chest x-ray without obvious pneumonia, pneumothorax, pulmonary vascular congestion.  Chest x-ray did show nodular density left lower lobe which is where patient has been experiencing pain.  Awaiting CTA imaging of patient's chest for further assessment of nodular density and PE r/o.  At time of shift change, patient care handed off  to [facility]. Patient stable upon shift change.          Final Clinical Impression(s) / ED Diagnoses Final diagnoses:  None    Rx / DC Orders ED Discharge Orders     None         Silver Wonda LABOR, [place] 09/19/23 1609    Albertina Dixon, MD 09/19/23 2155

## 2023-09-22 DIAGNOSIS — M25511 Pain in right shoulder: Secondary | ICD-10-CM | POA: Diagnosis not present

## 2023-09-22 DIAGNOSIS — R350 Frequency of micturition: Secondary | ICD-10-CM | POA: Diagnosis not present

## 2023-09-22 DIAGNOSIS — I10 Essential (primary) hypertension: Secondary | ICD-10-CM | POA: Diagnosis not present

## 2023-09-22 DIAGNOSIS — Z299 Encounter for prophylactic measures, unspecified: Secondary | ICD-10-CM | POA: Diagnosis not present

## 2023-09-22 DIAGNOSIS — R52 Pain, unspecified: Secondary | ICD-10-CM | POA: Diagnosis not present

## 2023-09-22 DIAGNOSIS — N390 Urinary tract infection, site not specified: Secondary | ICD-10-CM | POA: Diagnosis not present

## 2023-09-22 DIAGNOSIS — M542 Cervicalgia: Secondary | ICD-10-CM | POA: Diagnosis not present

## 2023-09-27 ENCOUNTER — Encounter: Payer: Self-pay | Admitting: Physician Assistant

## 2023-09-27 ENCOUNTER — Ambulatory Visit (INDEPENDENT_AMBULATORY_CARE_PROVIDER_SITE_OTHER): Payer: 59 | Admitting: Physician Assistant

## 2023-09-27 VITALS — BP 118/75 | HR 76 | Resp 20 | Ht 63.0 in | Wt 145.0 lb

## 2023-09-27 DIAGNOSIS — R413 Other amnesia: Secondary | ICD-10-CM

## 2023-09-27 MED ORDER — MEMANTINE HCL 5 MG PO TABS: 5.0000 mg | ORAL_TABLET | Freq: Every evening | ORAL | 11 refills | Status: DC

## 2023-09-27 NOTE — Patient Instructions (Addendum)
 It was a pleasure to see you today at our office.   Recommendations:    Follow up in 6 months Memantine  5 mg at night Consider adult Day Program or moving to Independent living  No further driving    For psychiatric meds, mood meds: Please have your primary care physician manage these medications.  If you have any severe symptoms of a stroke, or other severe issues such as confusion,severe chills or fever, etc call 911 or go to the ER as you may need to be evaluated further   For guidance regarding [facility] and if placement were needed at the facility, contact Social Worker tel: [phone]  For assessment of decision of mental capacity and competency:  Call Dr. Rosaline Nine, geriatric psychiatrist at [phone]  Counseling regarding caregiver distress, including caregiver depression, anxiety and issues regarding community resources, adult day care programs, adult living facilities, or memory care questions:  please contact your  Primary Doctor's Social Worker   Whom to call: Memory  decline, memory medications: Call our office [phone]    [email or web address]   RECOMMENDATIONS FOR ALL PATIENTS WITH MEMORY PROBLEMS: 1. Continue to exercise (Recommend 30 minutes of walking everyday, or 3 hours every week) 2. Increase social interactions - continue going to [place] and enjoy social gatherings with friends and family 3. Eat healthy, avoid fried foods and eat more fruits and vegetables 4. Maintain adequate blood pressure, blood sugar, and blood cholesterol level. Reducing the risk of stroke and cardiovascular disease also helps promoting better memory. 5. Avoid stressful situations. Live a simple life and avoid aggravations. Organize your time and prepare for the next day in anticipation. 6. Sleep well, avoid any interruptions of sleep and avoid any distractions in the bedroom that may interfere with  adequate sleep quality 7. Avoid sugar, avoid sweets as there is a strong link between excessive sugar intake, diabetes, and cognitive impairment We discussed the Mediterranean diet, which has been shown to help patients reduce the risk of progressive memory disorders and reduces cardiovascular risk. This includes eating fish, eat fruits and green leafy vegetables, nuts like almonds and hazelnuts, walnuts, and also use olive oil. Avoid fast foods and fried foods as much as possible. Avoid sweets and sugar as sugar use has been linked to worsening of memory function.  There is always a concern of gradual progression of memory problems. If this is the case, then we may need to adjust level of care according to patient needs. Support, both to the patient and caregiver, should then be put into place.        DRIVING: Regarding driving, in patients with progressive memory problems, driving will be impaired. We advise to have someone else do the driving if trouble finding directions or if minor accidents are reported. Independent driving assessment is available to determine safety of driving.   If you are interested in the driving assessment, you can contact the following:  [employer] in [place] [phone]  [facility] [phone]  [facility] [phone]  [facility] [phone] or [phone]   FALL PRECAUTIONS: Be cautious when walking. Scan the area for obstacles that may increase the risk of trips and falls. When getting up in the mornings, sit up at the edge of the bed for a few minutes before getting out of bed. Consider elevating the bed at the head end to avoid drop of blood pressure when getting up. Walk always in a well-lit room (use night lights in  the walls). Avoid area rugs or power cords from appliances in the middle of the walkways. Use a walker or a cane if necessary and consider physical therapy for balance exercise. Get your  eyesight checked regularly.  FINANCIAL OVERSIGHT: Supervision, especially oversight when making financial decisions or transactions is also recommended.  HOME SAFETY: Consider the safety of the kitchen when operating appliances like stoves, microwave oven, and blender. Consider having supervision and share cooking responsibilities until no longer able to participate in those. Accidents with firearms and other hazards in the house should be identified and addressed as well.   ABILITY TO BE LEFT ALONE: If patient is unable to contact 911 operator, consider using LifeLine, or when the need is there, arrange for someone to stay with patients. Smoking is a fire hazard, consider supervision or cessation. Risk of wandering should be assessed by caregiver and if detected at any point, supervision and safe proof recommendations should be instituted.  MEDICATION SUPERVISION: Inability to self-administer medication needs to be constantly addressed. Implement a mechanism to ensure safe administration of the medications.      Mediterranean Diet A Mediterranean diet refers to food and lifestyle choices that are based on the traditions of countries located on the [employer]. This way of eating has been shown to help prevent certain conditions and improve outcomes for people who have chronic diseases, like kidney disease and heart disease. What are tips for following this plan? Lifestyle  Cook and eat meals together with your family, when possible. Drink enough fluid to keep your urine clear or pale yellow. Be physically active every day. This includes: Aerobic exercise like running or swimming. Leisure activities like gardening, walking, or housework. Get 7-8 hours of sleep each night. If recommended by your health care provider, drink red wine in moderation. This means 1 glass a day for nonpregnant women and 2 glasses a day for men. A glass of wine equals 5 oz (150 mL). Reading food labels  Check  the serving size of packaged foods. For foods such as rice and pasta, the serving size refers to the amount of cooked product, not dry. Check the total fat in packaged foods. Avoid foods that have saturated fat or trans fats. Check the ingredients list for added sugars, such as corn syrup. Shopping  At the grocery store, buy most of your food from the areas near the walls of the store. This includes: Fresh fruits and vegetables (produce). Grains, beans, nuts, and seeds. Some of these may be available in unpackaged forms or large amounts (in bulk). Fresh seafood. Poultry and eggs. Low-fat dairy products. Buy whole ingredients instead of prepackaged foods. Buy fresh fruits and vegetables in-season from local farmers markets. Buy frozen fruits and vegetables in resealable bags. If you do not have access to quality fresh seafood, buy precooked frozen shrimp or canned fish, such as tuna, salmon, or sardines. Buy small amounts of raw or cooked vegetables, salads, or olives from the deli or salad bar at your store. Stock your pantry so you always have certain foods on hand, such as olive oil, canned tuna, canned tomatoes, rice, pasta, and beans. Cooking  Cook foods with extra-virgin olive oil instead of using butter or other vegetable oils. Have meat as a side dish, and have vegetables or grains as your main dish. This means having meat in small portions or adding small amounts of meat to foods like pasta or stew. Use beans or vegetables instead of meat in common dishes like chili  or lasagna. Experiment with different cooking methods. Try roasting or broiling vegetables instead of steaming or sauteing them. Add frozen vegetables to soups, stews, pasta, or rice. Add nuts or seeds for added healthy fat at each meal. You can add these to yogurt, salads, or vegetable dishes. Marinate fish or vegetables using olive oil, lemon juice, garlic, and fresh herbs. Meal planning  Plan to eat 1 vegetarian meal  one day each week. Try to work up to 2 vegetarian meals, if possible. Eat seafood 2 or more times a week. Have healthy snacks readily available, such as: Vegetable sticks with hummus. Greek yogurt. Fruit and nut trail mix. Eat balanced meals throughout the week. This includes: Fruit: 2-3 servings a day Vegetables: 4-5 servings a day Low-fat dairy: 2 servings a day Fish, poultry, or lean meat: 1 serving a day Beans and legumes: 2 or more servings a week Nuts and seeds: 1-2 servings a day Whole grains: 6-8 servings a day Extra-virgin olive oil: 3-4 servings a day Limit red meat and sweets to only a few servings a month What are my food choices? Mediterranean diet Recommended Grains: Whole-grain pasta. Brown rice. Bulgar wheat. Polenta. Couscous. Whole-wheat bread. [place] Made[place]. Vegetables: Artichokes. Beets. Broccoli. Cabbage. Carrots. Eggplant. Green beans. Chard. Kale. Spinach. Onions. Leeks. Peas. Squash. Tomatoes. Peppers. Radishes. Fruits: Apples. Apricots. Avocado. Berries. Bananas. Cherries. Dates. Figs. Grapes. Lemons. Melon. Oranges. Peaches. Plums. Pomegranate. Meats and other protein foods: Beans. Almonds. Sunflower seeds. Pine nuts. Peanuts. Cod. Salmon. Scallops. Shrimp. Tuna. Tilapia. Clams. Oysters. Eggs. Dairy: Low-fat milk. Cheese. Greek yogurt. Beverages: Water . Red wine. Herbal tea. Fats and oils: Extra virgin olive oil. Avocado oil. Grape seed oil. Sweets and desserts: Greek yogurt with honey. Baked apples. Poached pears. Trail mix. Seasoning and other foods: Basil. Cilantro. Coriander. Cumin. Mint. Parsley. Sage. Rosemary. Tarragon. Garlic. Oregano. Thyme. Pepper. Balsalmic vinegar. Tahini. Hummus. Tomato sauce. Olives. Mushrooms. Limit these Grains: Prepackaged pasta or rice dishes. Prepackaged cereal with added sugar. Vegetables: Deep fried potatoes (french fries). Fruits: Fruit canned in syrup. Meats and other protein foods: Beef. Pork. Lamb. Poultry with  skin. Hot dogs. [place]. Dairy: Ice cream. Sour cream. Whole milk. Beverages: Juice. Sugar-sweetened soft drinks. Beer. Liquor and spirits. Fats and oils: Butter. Canola oil. Vegetable oil. Beef fat (tallow). Lard. Sweets and desserts: Cookies. Cakes. Pies. Candy. Seasoning and other foods: Mayonnaise. Premade sauces and marinades. The items listed may not be a complete list. Talk with your dietitian about what dietary choices are right for you. Summary The Mediterranean diet includes both food and lifestyle choices. Eat a variety of fresh fruits and vegetables, beans, nuts, seeds, and whole grains. Limit the amount of red meat and sweets that you eat. Talk with your health care provider about whether it is safe for you to drink red wine in moderation. This means 1 glass a day for nonpregnant women and 2 glasses a day for men. A glass of wine equals 5 oz (150 mL). This information is not intended to replace advice given to you by your health care provider. Make sure you discuss any questions you have with your health care provider. Document Released: 04/22/2016 Document Revised: 05/25/2016 Document Reviewed: 04/22/2016 Elsevier Interactive Patient Education  2017 [employer].
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
## 2023-10-05 IMAGING — DX DG SACRUM/COCCYX 2+V
3 series · 3 of 3 positions shown · non-contrast
Comparison: None

CLINICAL DATA: Low back and tailbone pain post fall 2 weeks ago,
pain runs on LEFT side

EXAM:
SACRUM AND COCCYX - 2+ VIEW

[coccyx ap]
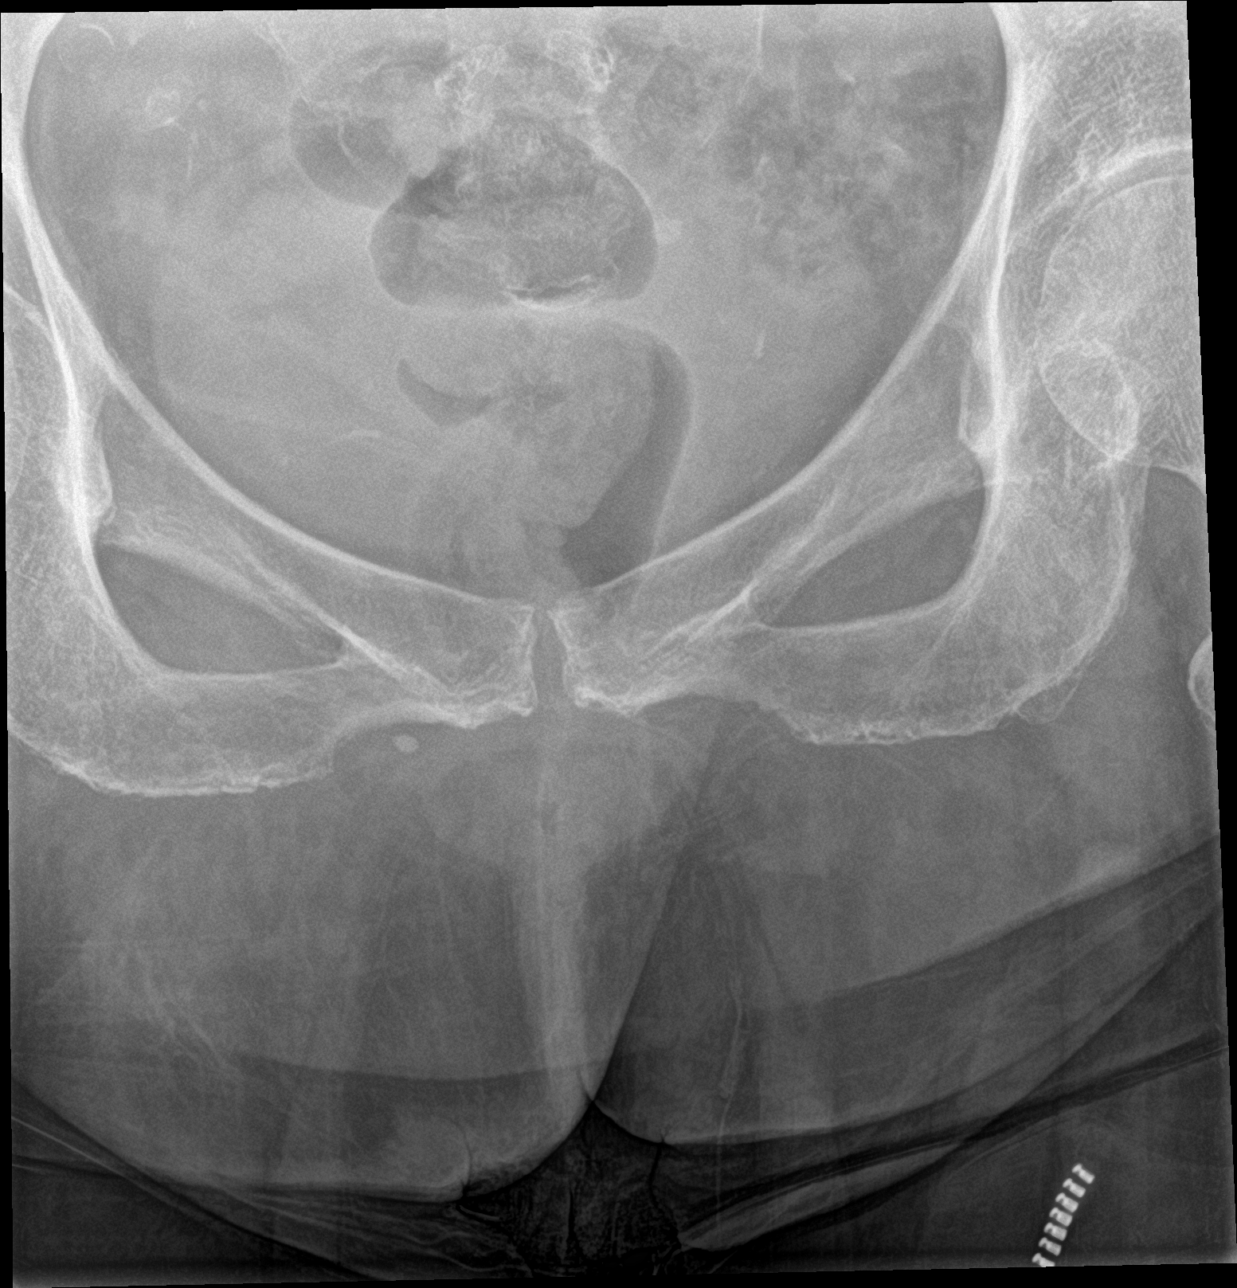

[sacrum ap]
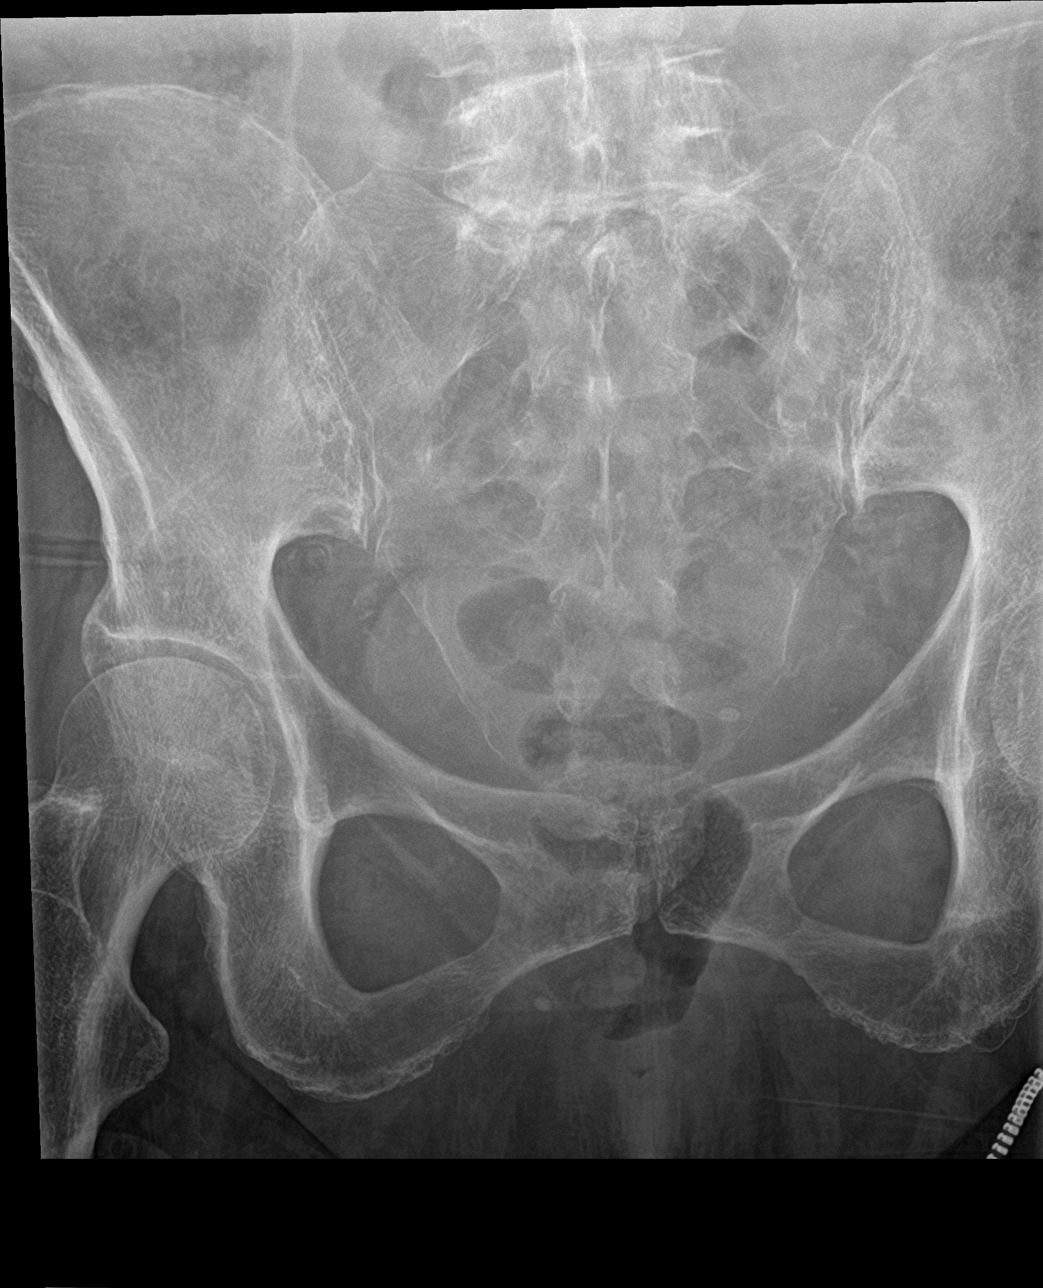

[sacrum lat]
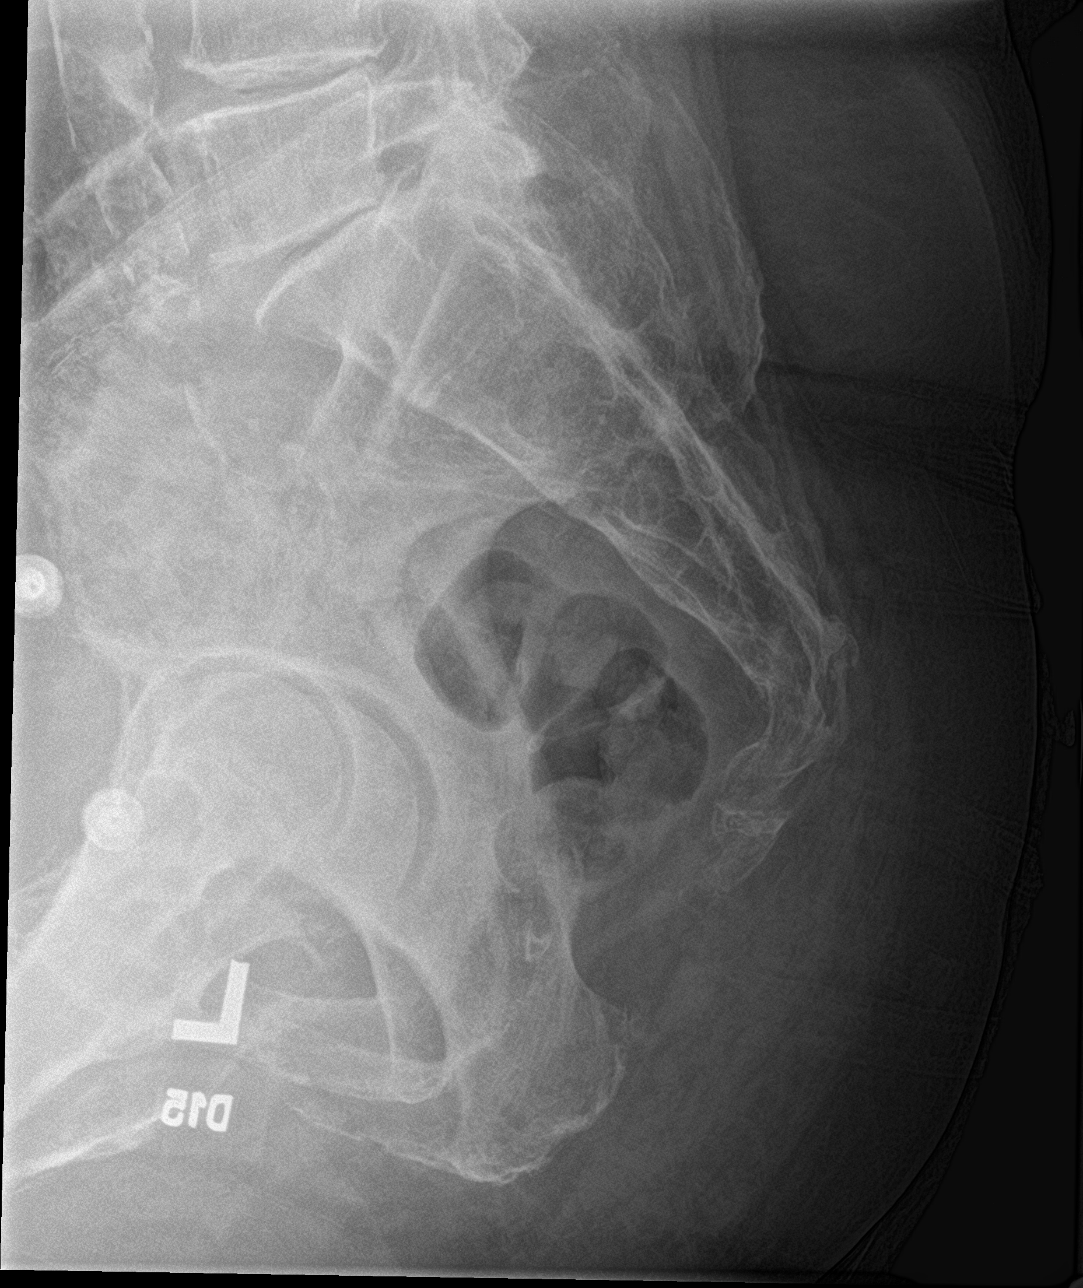

[3 of 3 positions shown; findings below may reference images not displayed]

FINDINGS: Osseous demineralization.

SI joint spaces preserved.

No definite sacrococcygeal fractures identified.

No bone destruction.
IMPRESSION: No acute sacrococcygeal abnormalities identified.

## 2023-10-05 IMAGING — DX DG LUMBAR SPINE COMPLETE 4+V
5 series · 5 of 5 positions shown · non-contrast
Comparison: CT abdomen and pelvis 09/27/2019

CLINICAL DATA: Low back and tailbone pain post fall 2 weeks ago,
pain runs down LEFT side

EXAM:
LUMBAR SPINE - COMPLETE 4+ VIEW

[l-spine ap]
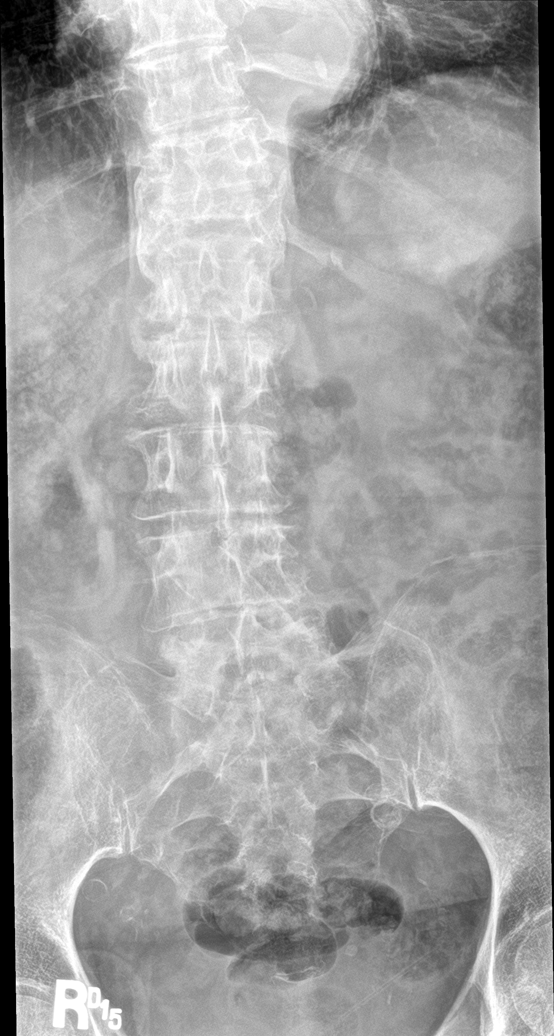

[l-spine obl (1 of 2)]
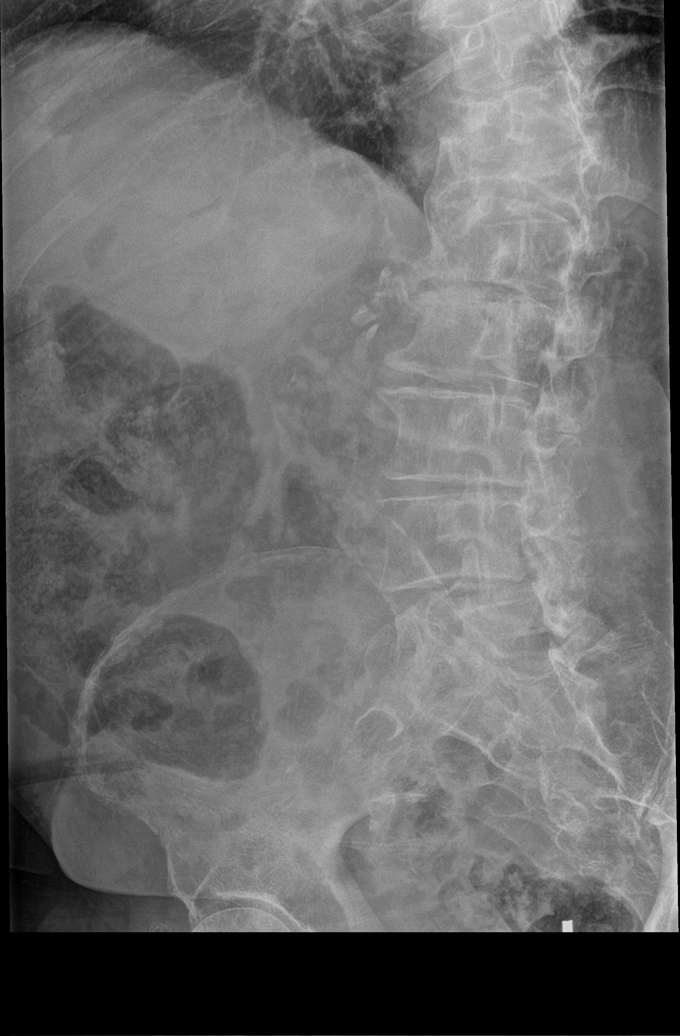

[l-spine obl (2 of 2)]
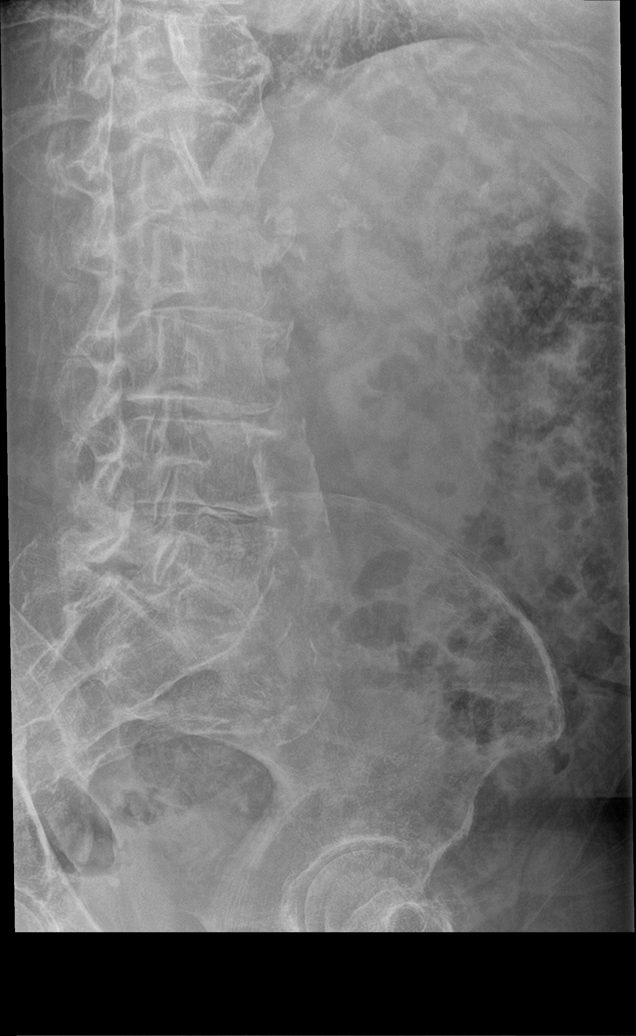

[l-spine lat]
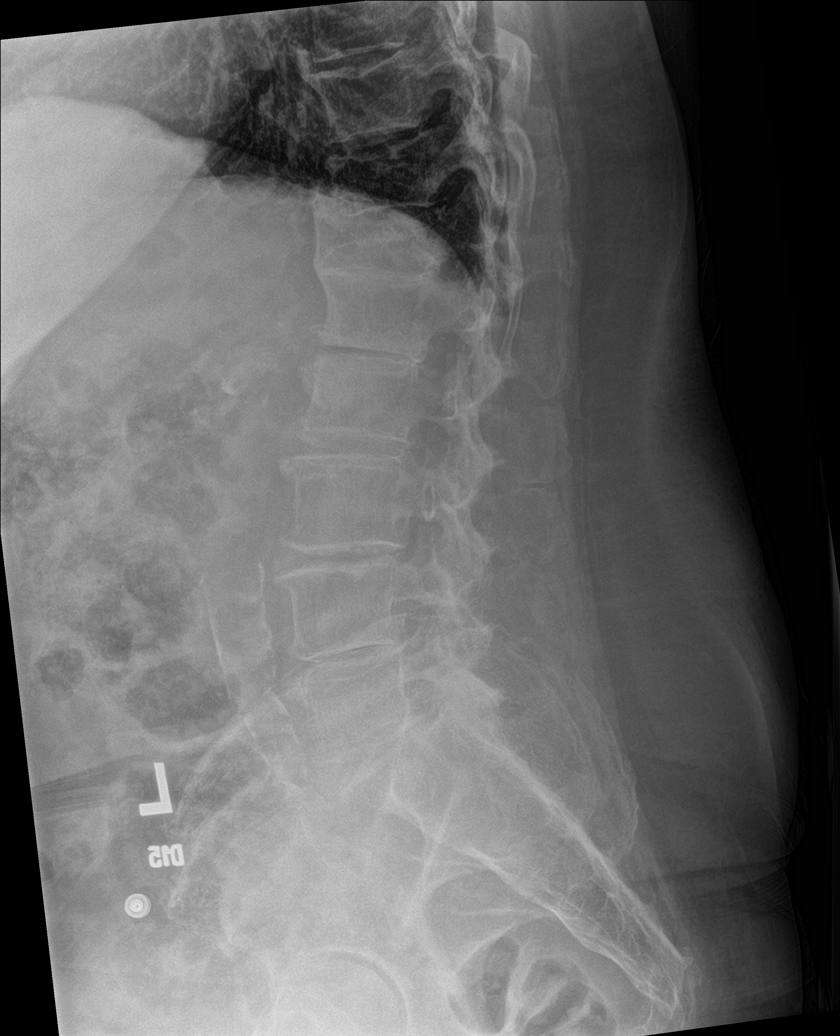

[l-spine spot]
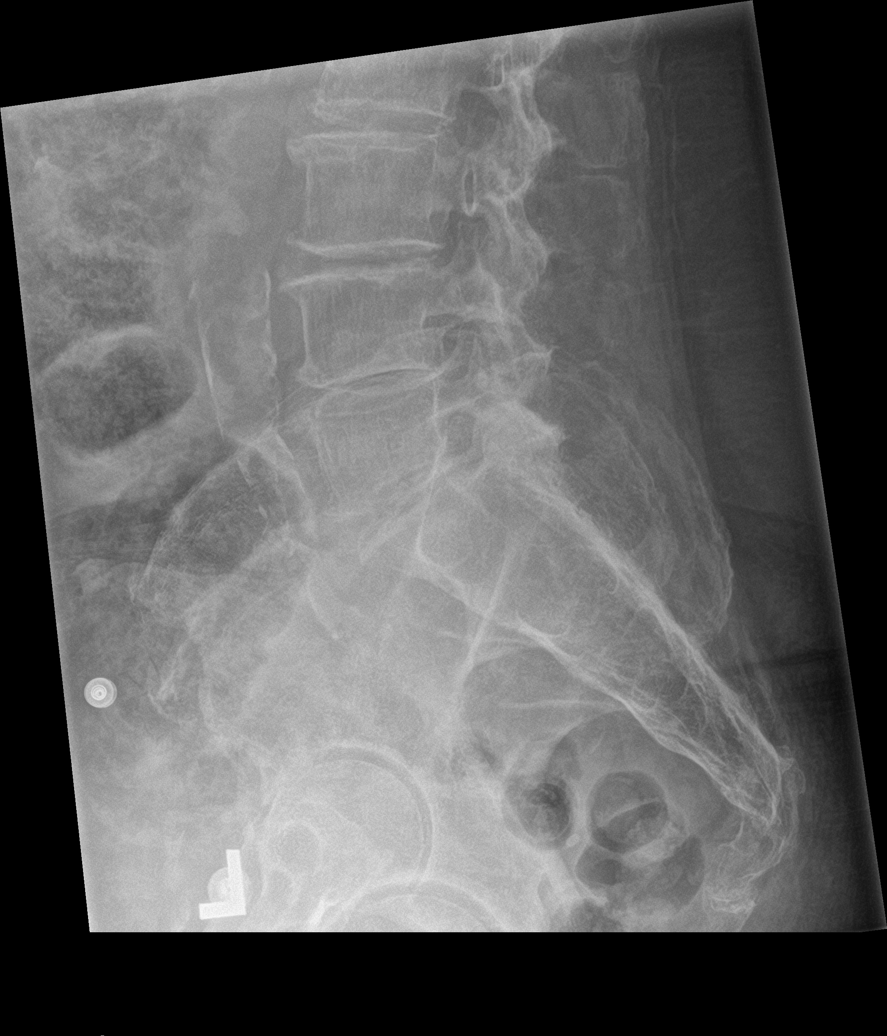

[5 of 5 positions shown; findings below may reference images not displayed]

FINDINGS: Five non-rib-bearing lumbar vertebra.

Biconvex thoracolumbar scoliosis.

Chronic compression fractures of T11 and T12, thoracic spine
reported separately.

Lumbar vertebral body heights maintained with scattered disc space
narrowing noted.

No fracture, subluxation, or bone destruction within lumbar spine.

SI joints symmetric.

Atherosclerotic calcifications aorta.
IMPRESSION: Osseous demineralization with degenerative changes and scoliosis of
lumbar spine.

Chronic compression fractures of T11 and T12.

No acute osseous abnormalities.

Aortic Atherosclerosis ([id number]-123.3).

## 2023-10-06 DIAGNOSIS — M79603 Pain in arm, unspecified: Secondary | ICD-10-CM | POA: Diagnosis not present

## 2023-10-06 DIAGNOSIS — I5022 Chronic systolic (congestive) heart failure: Secondary | ICD-10-CM | POA: Diagnosis not present

## 2023-10-06 DIAGNOSIS — Z299 Encounter for prophylactic measures, unspecified: Secondary | ICD-10-CM | POA: Diagnosis not present

## 2023-10-06 DIAGNOSIS — I10 Essential (primary) hypertension: Secondary | ICD-10-CM | POA: Diagnosis not present

## 2023-10-06 DIAGNOSIS — R52 Pain, unspecified: Secondary | ICD-10-CM | POA: Diagnosis not present

## 2023-10-06 DIAGNOSIS — R350 Frequency of micturition: Secondary | ICD-10-CM | POA: Diagnosis not present

## 2023-10-06 DIAGNOSIS — N390 Urinary tract infection, site not specified: Secondary | ICD-10-CM | POA: Diagnosis not present

## 2023-10-06 DIAGNOSIS — M25511 Pain in right shoulder: Secondary | ICD-10-CM | POA: Diagnosis not present

## 2023-10-10 ENCOUNTER — Ambulatory Visit: Payer: 59 | Admitting: Surgical

## 2023-10-10 DIAGNOSIS — M19011 Primary osteoarthritis, right shoulder: Secondary | ICD-10-CM | POA: Diagnosis not present

## 2023-10-10 DIAGNOSIS — M25511 Pain in right shoulder: Secondary | ICD-10-CM | POA: Diagnosis not present

## 2023-10-14 ENCOUNTER — Ambulatory Visit: Payer: 59 | Attending: Cardiology | Admitting: Cardiology

## 2023-10-14 ENCOUNTER — Other Ambulatory Visit: Payer: Self-pay

## 2023-10-14 ENCOUNTER — Inpatient Hospital Stay (HOSPITAL_COMMUNITY)
Admission: EM | Admit: 2023-10-14 | Discharge: 2023-10-20 | DRG: 481 | Disposition: A | Discharge status: Skilled Nursing Facility | Payer: 59 | Attending: Internal Medicine | Admitting: Internal Medicine

## 2023-10-14 ENCOUNTER — Encounter (HOSPITAL_COMMUNITY): Payer: Self-pay | Admitting: *Deleted

## 2023-10-14 ENCOUNTER — Emergency Department (HOSPITAL_COMMUNITY): Payer: 59

## 2023-10-14 ENCOUNTER — Inpatient Hospital Stay (HOSPITAL_COMMUNITY): Payer: 59

## 2023-10-14 DIAGNOSIS — Y92009 Unspecified place in unspecified non-institutional (private) residence as the place of occurrence of the external cause: Secondary | ICD-10-CM | POA: Diagnosis not present

## 2023-10-14 DIAGNOSIS — F419 Anxiety disorder, unspecified: Secondary | ICD-10-CM | POA: Diagnosis present

## 2023-10-14 DIAGNOSIS — Z9049 Acquired absence of other specified parts of digestive tract: Secondary | ICD-10-CM

## 2023-10-14 DIAGNOSIS — D62 Acute posthemorrhagic anemia: Secondary | ICD-10-CM | POA: Diagnosis not present

## 2023-10-14 DIAGNOSIS — I10 Essential (primary) hypertension: Secondary | ICD-10-CM | POA: Diagnosis not present

## 2023-10-14 DIAGNOSIS — S72141A Displaced intertrochanteric fracture of right femur, initial encounter for closed fracture: Secondary | ICD-10-CM | POA: Diagnosis present

## 2023-10-14 DIAGNOSIS — Z9181 History of falling: Secondary | ICD-10-CM | POA: Diagnosis not present

## 2023-10-14 DIAGNOSIS — J449 Chronic obstructive pulmonary disease, unspecified: Secondary | ICD-10-CM | POA: Diagnosis present

## 2023-10-14 DIAGNOSIS — I480 Paroxysmal atrial fibrillation: Secondary | ICD-10-CM | POA: Diagnosis present

## 2023-10-14 DIAGNOSIS — M47816 Spondylosis without myelopathy or radiculopathy, lumbar region: Secondary | ICD-10-CM | POA: Diagnosis not present

## 2023-10-14 DIAGNOSIS — Z743 Need for continuous supervision: Secondary | ICD-10-CM | POA: Diagnosis not present

## 2023-10-14 DIAGNOSIS — J438 Other emphysema: Secondary | ICD-10-CM | POA: Diagnosis not present

## 2023-10-14 DIAGNOSIS — R413 Other amnesia: Secondary | ICD-10-CM | POA: Diagnosis present

## 2023-10-14 DIAGNOSIS — E538 Deficiency of other specified B group vitamins: Secondary | ICD-10-CM | POA: Diagnosis present

## 2023-10-14 DIAGNOSIS — Z8249 Family history of ischemic heart disease and other diseases of the circulatory system: Secondary | ICD-10-CM | POA: Diagnosis not present

## 2023-10-14 DIAGNOSIS — I951 Orthostatic hypotension: Secondary | ICD-10-CM | POA: Diagnosis not present

## 2023-10-14 DIAGNOSIS — M19011 Primary osteoarthritis, right shoulder: Secondary | ICD-10-CM | POA: Diagnosis not present

## 2023-10-14 DIAGNOSIS — W010XXA Fall on same level from slipping, tripping and stumbling without subsequent striking against object, initial encounter: Secondary | ICD-10-CM | POA: Diagnosis present

## 2023-10-14 DIAGNOSIS — Z882 Allergy status to sulfonamides status: Secondary | ICD-10-CM

## 2023-10-14 DIAGNOSIS — N390 Urinary tract infection, site not specified: Secondary | ICD-10-CM | POA: Diagnosis present

## 2023-10-14 DIAGNOSIS — I714 Abdominal aortic aneurysm, without rupture, unspecified: Secondary | ICD-10-CM | POA: Diagnosis present

## 2023-10-14 DIAGNOSIS — N179 Acute kidney failure, unspecified: Secondary | ICD-10-CM | POA: Diagnosis not present

## 2023-10-14 DIAGNOSIS — I2510 Atherosclerotic heart disease of native coronary artery without angina pectoris: Secondary | ICD-10-CM | POA: Diagnosis present

## 2023-10-14 DIAGNOSIS — S72001A Fracture of unspecified part of neck of right femur, initial encounter for closed fracture: Secondary | ICD-10-CM | POA: Diagnosis not present

## 2023-10-14 DIAGNOSIS — D72829 Elevated white blood cell count, unspecified: Secondary | ICD-10-CM | POA: Diagnosis not present

## 2023-10-14 DIAGNOSIS — K219 Gastro-esophageal reflux disease without esophagitis: Secondary | ICD-10-CM | POA: Diagnosis present

## 2023-10-14 DIAGNOSIS — E782 Mixed hyperlipidemia: Secondary | ICD-10-CM | POA: Diagnosis present

## 2023-10-14 DIAGNOSIS — I5020 Unspecified systolic (congestive) heart failure: Secondary | ICD-10-CM | POA: Diagnosis present

## 2023-10-14 DIAGNOSIS — B9620 Unspecified Escherichia coli [E. coli] as the cause of diseases classified elsewhere: Secondary | ICD-10-CM | POA: Diagnosis not present

## 2023-10-14 DIAGNOSIS — R519 Headache, unspecified: Secondary | ICD-10-CM | POA: Diagnosis not present

## 2023-10-14 DIAGNOSIS — Y92000 Kitchen of unspecified non-institutional (private) residence as  the place of occurrence of the external cause: Secondary | ICD-10-CM | POA: Diagnosis not present

## 2023-10-14 DIAGNOSIS — Z602 Problems related to living alone: Secondary | ICD-10-CM | POA: Diagnosis present

## 2023-10-14 DIAGNOSIS — Z79899 Other long term (current) drug therapy: Secondary | ICD-10-CM

## 2023-10-14 DIAGNOSIS — I499 Cardiac arrhythmia, unspecified: Secondary | ICD-10-CM | POA: Diagnosis not present

## 2023-10-14 DIAGNOSIS — I429 Cardiomyopathy, unspecified: Secondary | ICD-10-CM | POA: Diagnosis present

## 2023-10-14 DIAGNOSIS — Z888 Allergy status to other drugs, medicaments and biological substances status: Secondary | ICD-10-CM

## 2023-10-14 DIAGNOSIS — J410 Simple chronic bronchitis: Secondary | ICD-10-CM | POA: Diagnosis not present

## 2023-10-14 DIAGNOSIS — Z1612 Extended spectrum beta lactamase (ESBL) resistance: Secondary | ICD-10-CM | POA: Diagnosis not present

## 2023-10-14 DIAGNOSIS — Z981 Arthrodesis status: Secondary | ICD-10-CM | POA: Diagnosis not present

## 2023-10-14 DIAGNOSIS — S72141S Displaced intertrochanteric fracture of right femur, sequela: Secondary | ICD-10-CM | POA: Diagnosis not present

## 2023-10-14 DIAGNOSIS — Z90710 Acquired absence of both cervix and uterus: Secondary | ICD-10-CM

## 2023-10-14 DIAGNOSIS — M1990 Unspecified osteoarthritis, unspecified site: Secondary | ICD-10-CM | POA: Diagnosis not present

## 2023-10-14 DIAGNOSIS — I110 Hypertensive heart disease with heart failure: Secondary | ICD-10-CM | POA: Diagnosis present

## 2023-10-14 DIAGNOSIS — M8580 Other specified disorders of bone density and structure, unspecified site: Secondary | ICD-10-CM | POA: Diagnosis not present

## 2023-10-14 DIAGNOSIS — D649 Anemia, unspecified: Secondary | ICD-10-CM | POA: Diagnosis present

## 2023-10-14 DIAGNOSIS — I5022 Chronic systolic (congestive) heart failure: Secondary | ICD-10-CM | POA: Diagnosis present

## 2023-10-14 DIAGNOSIS — W19XXXA Unspecified fall, initial encounter: Secondary | ICD-10-CM | POA: Diagnosis not present

## 2023-10-14 DIAGNOSIS — Z043 Encounter for examination and observation following other accident: Secondary | ICD-10-CM | POA: Diagnosis not present

## 2023-10-14 DIAGNOSIS — F32A Depression, unspecified: Secondary | ICD-10-CM | POA: Diagnosis present

## 2023-10-14 DIAGNOSIS — S0181XA Laceration without foreign body of other part of head, initial encounter: Secondary | ICD-10-CM | POA: Diagnosis not present

## 2023-10-14 DIAGNOSIS — D520 Dietary folate deficiency anemia: Secondary | ICD-10-CM | POA: Diagnosis not present

## 2023-10-14 DIAGNOSIS — Z96653 Presence of artificial knee joint, bilateral: Secondary | ICD-10-CM | POA: Diagnosis present

## 2023-10-14 DIAGNOSIS — K2100 Gastro-esophageal reflux disease with esophagitis, without bleeding: Secondary | ICD-10-CM | POA: Diagnosis not present

## 2023-10-14 DIAGNOSIS — R531 Weakness: Secondary | ICD-10-CM | POA: Diagnosis not present

## 2023-10-14 DIAGNOSIS — I672 Cerebral atherosclerosis: Secondary | ICD-10-CM | POA: Diagnosis not present

## 2023-10-14 DIAGNOSIS — I700 Atherosclerosis of aorta: Secondary | ICD-10-CM | POA: Diagnosis not present

## 2023-10-14 DIAGNOSIS — R0902 Hypoxemia: Secondary | ICD-10-CM | POA: Diagnosis not present

## 2023-10-14 DIAGNOSIS — D72823 Leukemoid reaction: Secondary | ICD-10-CM | POA: Diagnosis not present

## 2023-10-14 DIAGNOSIS — A499 Bacterial infection, unspecified: Secondary | ICD-10-CM | POA: Diagnosis not present

## 2023-10-14 DIAGNOSIS — Y93E9 Activity, other interior property and clothing maintenance: Secondary | ICD-10-CM | POA: Diagnosis not present

## 2023-10-14 DIAGNOSIS — D500 Iron deficiency anemia secondary to blood loss (chronic): Secondary | ICD-10-CM | POA: Diagnosis not present

## 2023-10-14 DIAGNOSIS — S72141D Displaced intertrochanteric fracture of right femur, subsequent encounter for closed fracture with routine healing: Secondary | ICD-10-CM | POA: Diagnosis not present

## 2023-10-14 DIAGNOSIS — M25511 Pain in right shoulder: Secondary | ICD-10-CM | POA: Diagnosis not present

## 2023-10-14 DIAGNOSIS — Z800 Family history of malignant neoplasm of digestive organs: Secondary | ICD-10-CM

## 2023-10-14 LAB — CBC WITH DIFFERENTIAL/PLATELET
Abs Immature Granulocytes: 0.12 10*3/uL — ABNORMAL HIGH (ref 0.00–0.07)
Basophils Absolute: 0.1 10*3/uL (ref 0.0–0.1)
Basophils Relative: 0 %
Eosinophils Absolute: 0 10*3/uL (ref 0.0–0.5)
Eosinophils Relative: 0 %
HCT: 29.2 % — ABNORMAL LOW (ref 36.0–46.0)
Hemoglobin: 9.6 g/dL — ABNORMAL LOW (ref 12.0–15.0)
Immature Granulocytes: 1 %
Lymphocytes Relative: 5 %
Lymphs Abs: 0.9 10*3/uL (ref 0.7–4.0)
MCH: 29.3 pg (ref 26.0–34.0)
MCHC: 32.9 g/dL (ref 30.0–36.0)
MCV: 89 fL (ref 80.0–100.0)
Monocytes Absolute: 1 10*3/uL (ref 0.1–1.0)
Monocytes Relative: 6 %
Neutro Abs: 15.1 10*3/uL — ABNORMAL HIGH (ref 1.7–7.7)
Neutrophils Relative %: 88 %
Platelets: 197 10*3/uL (ref 150–400)
RBC: 3.28 MIL/uL — ABNORMAL LOW (ref 3.87–5.11)
RDW: 14.6 % (ref 11.5–15.5)
WBC: 17.3 10*3/uL — ABNORMAL HIGH (ref 4.0–10.5)
nRBC: 0 % (ref 0.0–0.2)

## 2023-10-14 LAB — BASIC METABOLIC PANEL
Anion gap: 11 (ref 5–15)
BUN: 27 mg/dL — ABNORMAL HIGH (ref 8–23)
CO2: 18 mmol/L — ABNORMAL LOW (ref 22–32)
Calcium: 9.5 mg/dL (ref 8.9–10.3)
Chloride: 107 mmol/L (ref 98–111)
Creatinine, Ser: 1.05 mg/dL — ABNORMAL HIGH (ref 0.44–1.00)
GFR, Estimated: 54 mL/min — ABNORMAL LOW (ref >60)
Glucose, Bld: 125 mg/dL — ABNORMAL HIGH (ref 70–99)
Potassium: 4.9 mmol/L (ref 3.5–5.1)
Sodium: 136 mmol/L (ref 135–145)

## 2023-10-14 LAB — SURGICAL PCR SCREEN
MRSA, PCR: NEGATIVE
Staphylococcus aureus: NEGATIVE

## 2023-10-14 LAB — HEMOGLOBIN AND HEMATOCRIT, BLOOD
HCT: 28.5 % — ABNORMAL LOW (ref 36.0–46.0)
Hemoglobin: 9.6 g/dL — ABNORMAL LOW (ref 12.0–15.0)

## 2023-10-14 MED ORDER — ONDANSETRON HCL 4 MG/2ML IJ SOLN
4.0000 mg | Freq: Once | INTRAMUSCULAR | Status: AC
  Administered 2023-10-14: 4 mg via INTRAVENOUS
  Filled 2023-10-14: qty 2

## 2023-10-14 MED ORDER — LORAZEPAM 1 MG PO TABS
1.0000 mg | ORAL_TABLET | Freq: Two times a day (BID) | ORAL | Status: DC
  Administered 2023-10-15 – 2023-10-20 (×12): 1 mg via ORAL
  Filled 2023-10-14 (×12): qty 1

## 2023-10-14 MED ORDER — IPRATROPIUM-ALBUTEROL 0.5-2.5 (3) MG/3ML IN SOLN: 3.0000 mL | Freq: Four times a day (QID) | RESPIRATORY_TRACT | Status: DC | PRN

## 2023-10-14 MED ORDER — LIDOCAINE-EPINEPHRINE (PF) 2 %-1:200000 IJ SOLN
10.0000 mL | Freq: Once | INTRAMUSCULAR | Status: AC
  Administered 2023-10-14: 10 mL via INTRADERMAL
  Filled 2023-10-14: qty 20

## 2023-10-14 MED ORDER — AMITRIPTYLINE HCL 50 MG PO TABS
75.0000 mg | ORAL_TABLET | Freq: Every day | ORAL | Status: DC
  Administered 2023-10-15 – 2023-10-19 (×6): 75 mg via ORAL
  Filled 2023-10-14 (×6): qty 1

## 2023-10-14 MED ORDER — HYDROCODONE-ACETAMINOPHEN 5-325 MG PO TABS
1.0000 | ORAL_TABLET | Freq: Four times a day (QID) | ORAL | Status: DC | PRN
  Administered 2023-10-14 – 2023-10-20 (×10): 2 via ORAL
  Filled 2023-10-14 (×10): qty 2

## 2023-10-14 MED ORDER — MEMANTINE HCL 10 MG PO TABS
5.0000 mg | ORAL_TABLET | Freq: Every day | ORAL | Status: DC
Start: 2023-10-15 — End: 2023-10-19
  Administered 2023-10-15 – 2023-10-18 (×5): 5 mg via ORAL
  Filled 2023-10-14 (×5): qty 1

## 2023-10-14 MED ORDER — SERTRALINE HCL 100 MG PO TABS
100.0000 mg | ORAL_TABLET | Freq: Every day | ORAL | Status: DC
Start: 2023-10-14 — End: 2023-10-20
  Administered 2023-10-14 – 2023-10-19 (×6): 100 mg via ORAL
  Filled 2023-10-14 (×6): qty 1

## 2023-10-14 MED ORDER — BISOPROLOL FUMARATE 5 MG PO TABS
5.0000 mg | ORAL_TABLET | Freq: Every day | ORAL | Status: DC
Start: 2023-10-15 — End: 2023-10-14

## 2023-10-14 MED ORDER — ENOXAPARIN SODIUM 40 MG/0.4ML IJ SOSY
40.0000 mg | PREFILLED_SYRINGE | INTRAMUSCULAR | Status: DC
  Administered 2023-10-14 – 2023-10-16 (×3): 40 mg via SUBCUTANEOUS
  Filled 2023-10-14 (×3): qty 0.4

## 2023-10-14 MED ORDER — MORPHINE SULFATE (PF) 2 MG/ML IV SOLN
0.5000 mg | INTRAVENOUS | Status: DC | PRN
  Administered 2023-10-15 (×2): 0.5 mg via INTRAVENOUS
  Filled 2023-10-14 (×2): qty 1

## 2023-10-14 MED ORDER — FUROSEMIDE 20 MG PO TABS
20.0000 mg | ORAL_TABLET | ORAL | Status: DC
  Administered 2023-10-15: 20 mg via ORAL
  Filled 2023-10-14: qty 1

## 2023-10-14 MED ORDER — MORPHINE SULFATE (PF) 4 MG/ML IV SOLN
4.0000 mg | Freq: Once | INTRAVENOUS | Status: AC
  Administered 2023-10-14: 4 mg via INTRAVENOUS
  Filled 2023-10-14: qty 1

## 2023-10-14 MED ORDER — PANTOPRAZOLE SODIUM 40 MG PO TBEC
40.0000 mg | DELAYED_RELEASE_TABLET | Freq: Every day | ORAL | Status: DC
  Administered 2023-10-14 – 2023-10-19 (×6): 40 mg via ORAL
  Filled 2023-10-14 (×6): qty 1

## 2023-10-14 MED ORDER — SPIRONOLACTONE 25 MG PO TABS
25.0000 mg | ORAL_TABLET | Freq: Every day | ORAL | Status: DC
  Administered 2023-10-15: 25 mg via ORAL
  Filled 2023-10-14: qty 1

## 2023-10-14 NOTE — ED Triage Notes (Signed)
Patient presents to ed via [facility] states she was going to the bathroom and tripped over her own feet , unsure what she hit her head on small hematoma with small laceration to her right forehead. Denies loc not on thinners. C/o right shoulder and right hip pain. Positive right  radial and right pedal pulse.

## 2023-10-14 NOTE — Consult Note (Signed)
Reason for Consult:Right hip fx Referring Physician: Melene Plan Time called: 1158 Time at bedside: 1312   Michelle Zavala is an 78 y.o. female.  HPI: Teneil tripped over her feet earlier today and fell. She had immediate right hip pain and could not get up. She was brought to the ED where x-rays showed a hip fx and orthopedic surgery was consulted. She lives at home alone and does not use any assistive devices to ambulate.  Past Medical History:  Diagnosis Date   Anxiety    Arthritis    C2 cervical fracture (HCC) 09/17/2013   Traumatic fracture witth minimal displacement   Cardiomyopathy (HCC)    a. EF 35-40% by echo in 08/2021   Chronic lung disease    Fibrosis - Dr. Orson Aloe   COPD (chronic obstructive pulmonary disease) (HCC)    Coronary atherosclerosis of native coronary artery    a. Mild atherosclerosis by cath in 2010 b. NST in 07/2021 showing fixed defects but EF at 41% by NST and 35-40% by echo   Depression    Diverticulosis    Dyspnea    Essential hypertension    GERD (gastroesophageal reflux disease)    PSVT (paroxysmal supraventricular tachycardia) (HCC)     Past Surgical History:  Procedure Laterality Date   ABDOMINAL HYSTERECTOMY     ANTERIOR RELEASE VERTEBRAL BODY W/ POSTERIOR FUSION     BACTERIAL OVERGROWTH TEST N/A 02/19/2016   Procedure: BACTERIAL OVERGROWTH TEST;  Surgeon: Corbin Ade, MD;  [facility]: [facility];  Service: Endoscopy;  Laterality: N/A;  0700   BIOPSY N/A 08/04/2015   Procedure: BIOPSY;  Surgeon: Corbin Ade, MD;  Location: [facility];  Service: Endoscopy;  Laterality: N/A;  Gastric   BIOPSY  02/21/2017   Procedure: BIOPSY;  Surgeon: Corbin Ade, MD;  [facility];  Service: Endoscopy;;  ascending colon biopsy    BIOPSY  08/21/2020   Procedure: BIOPSY;  Surgeon: Corbin Ade, MD;  [facility]: [facility];  Service: Endoscopy;;   cataract surgery     CESAREAN SECTION     CHOLECYSTECTOMY  1973   COLONOSCOPY  June 2016   Dr.  Teena Dunk: moderate diverticulosis in sigmoid colon, surveillance in 5 years    COLONOSCOPY WITH PROPOFOL N/A 02/21/2017   Dr. Jena Gauss: diverticulosis in sigmoid colon, tubular adenomas, segmental biopsies benign. Surveillance in 5 years    COLONOSCOPY WITH PROPOFOL N/A 02/18/2023   Procedure: COLONOSCOPY WITH PROPOFOL;  Surgeon: Corbin Ade, MD;  [facility]: [facility];  Service: Endoscopy;  Laterality: N/A;  10:00 am, asa 3   ESOPHAGOGASTRODUODENOSCOPY (EGD) WITH PROPOFOL N/A 08/04/2015   Dr. Rourk:abnormal gastric mucosa s/p biopsy. Reactive gastropathy. Negative H.pylori   ESOPHAGOGASTRODUODENOSCOPY (EGD) WITH PROPOFOL N/A 02/21/2017   Dr. Jena Gauss: empiric esophageal dilatation, small hiatal hernia, otherwise normal   ESOPHAGOGASTRODUODENOSCOPY (EGD) WITH PROPOFOL N/A 08/21/2020   normal esophagus s/p dilation. Erythematous mucosa in stomach of doubtful clinical significant s/p biopsy. Negative H.pylori. Mild reactive gastropathy.    ESOPHAGOGASTRODUODENOSCOPY (EGD) WITH PROPOFOL N/A 02/18/2023   Procedure: ESOPHAGOGASTRODUODENOSCOPY (EGD) WITH PROPOFOL;  Surgeon: Corbin Ade, MD;  [facility];  Service: Endoscopy;  Laterality: N/A;   HIP PINNING,CANNULATED Left 06/11/2022   Procedure: PERCUTANEOUS FIXATION OF FEMORAL NECK;  Surgeon: Vickki Hearing, MD;  Location: [facility];  Service: Orthopedics;  Laterality: Left;   INCISIONAL HERNIA REPAIR N/A 12/03/2015   Procedure: Sherald Hess HERNIORRHAPHY WITH MESH;  Surgeon: Franky Macho, MD;  Location: [facility];  Service: General;  Laterality: N/A;   INSERTION OF MESH  12/03/2015   Procedure: INSERTION OF MESH;  Surgeon: Franky Macho, MD;  Location: [facility];  Service: General;;   IR RADIOLOGIST EVAL & MGMT  09/22/2017   MALONEY DILATION N/A 02/21/2017   Procedure: Elease Hashimoto DILATION;  Surgeon: Corbin Ade, MD;  [facility];  Service: Endoscopy;  Laterality: N/A;   MALONEY DILATION N/A 08/21/2020   Procedure: Elease Hashimoto DILATION;   Surgeon: Corbin Ade, MD;  [facility];  Service: Endoscopy;  Laterality: N/A;   POLYPECTOMY  02/21/2017   Procedure: POLYPECTOMY;  Surgeon: Corbin Ade, MD;  [facility];  Service: Endoscopy;;  hepatic flexure polyp cs   RIGHT/LEFT HEART CATH AND CORONARY ANGIOGRAPHY N/A 09/17/2021   Procedure: RIGHT/LEFT HEART CATH AND CORONARY ANGIOGRAPHY;  Surgeon: Yvonne Kendall, MD;  Location: [facility];  Service: Cardiovascular;  Laterality: N/A;   TMJ ARTHROSCOPY Bilateral 02/04/2015   Procedure: BILATERAL TEMPOROMANDIBULAR JOINT (TMJ) ARTHROSCOPY MENISECTOMY WITH FAT GRAFT FROM ABDOMEN ;  Surgeon: Ocie Doyne, DDS;  Location: [facility];  Service: Oral Surgery;  Laterality: Bilateral;   TOTAL KNEE ARTHROPLASTY Bilateral    TUBAL LIGATION      Family History  Problem Relation Age of Onset   Coronary artery disease Father        Premature   Heart disease Father    Coronary artery disease Brother        Premature   Coronary artery disease Sister        Premature   Colon cancer Son     Social History:  reports that she has never smoked. She has never been exposed to tobacco smoke. She has never used smokeless tobacco. She reports that she does not currently use alcohol. She reports that she does not use drugs.  Allergies:  Allergies  Allergen Reactions   Sulfonamide Derivatives Hives   Atorvastatin     Myalgias   Crestor [Rosuvastatin]     Myalgias   Entresto [Sacubitril-Valsartan]     Dizziness   Farxiga [Dapagliflozin]     Weakness    Medications: I have reviewed the patient's current medications.  No results found for this or any previous visit (from the past 48 hours).  DG Chest Port 1 View Result Date: 10/14/2023 CLINICAL DATA:  Fall. EXAM: PORTABLE CHEST 1 VIEW COMPARISON:  Chest radiograph dated 09/19/2023. FINDINGS: The heart size and mediastinal contours are within normal limits. Aortic atherosclerosis. No focal consolidation, sizeable  pleural effusion, or pneumothorax. Severe degenerative changes of the right glenohumeral joint. Partially visualized postoperative changes of the cervical spine. No acute osseous abnormality identified. IMPRESSION: No acute findings in the chest. Electronically Signed   By: Hart Robinsons M.D.   On: 10/14/2023 12:26   DG Shoulder Right Port Result Date: 10/14/2023 CLINICAL DATA:  Shoulder pain status post fall. EXAM: RIGHT SHOULDER - 1 VIEW COMPARISON:  10/10/2023. FINDINGS: There is no evidence of acute fracture or dislocation. Severe degenerative changes of the right glenohumeral joint are again noted with severe joint space narrowing, marginal osteophytosis, and subchondral sclerosis. Similar high-riding humeral head, likely relates to chronic rotator cuff pathology. Moderate degenerative changes of the acromioclavicular joint. Postoperative changes of the cervical spine. IMPRESSION: 1. No acute osseous abnormality. 2. Severe degenerative changes of the glenohumeral joint. 3. Similar high-riding humeral head, likely relates to chronic rotator cuff pathology. 4. Moderate degenerative changes of the acromioclavicular joint. Electronically Signed   By: Criss Rosales  Lateef M.D.   On: 10/14/2023 12:22   DG Hip Port [place] or [place] Pelvis 1 View Right Result Date: 10/14/2023 CLINICAL DATA:  Hip pain status post fall. EXAM: DG HIP (WITH OR WITHOUT PELVIS) 1V PORT RIGHT COMPARISON:  09/19/2023. FINDINGS: Diffuse osseous demineralization. There is an acute comminuted and impacted intertrochanteric fracture of the right proximal femur. There is approximately 5.5 cm of superior displacement of the distal fracture component with apex varus angulation. The right femoral head is seated within the acetabulum. Status post ORIF of the left proximal femur. The sacroiliac joints and pubic symphysis are anatomically aligned. Degenerative changes of the visualized lower lumbar spine. IMPRESSION: Acute comminuted, displaced and  impacted intertrochanteric fracture of the right proximal femur. Electronically Signed   By: Hart Robinsons M.D.   On: 10/14/2023 12:18    Review of Systems  HENT:  Negative for ear discharge, ear pain, hearing loss and tinnitus.   Eyes:  Negative for photophobia and pain.  Respiratory:  Negative for cough and shortness of breath.   Cardiovascular:  Negative for chest pain.  Gastrointestinal:  Negative for abdominal pain, nausea and vomiting.  Genitourinary:  Negative for dysuria, flank pain, frequency and urgency.  Musculoskeletal:  Positive for arthralgias (Right hip). Negative for back pain, myalgias and neck pain.  Neurological:  Negative for dizziness and headaches.  Hematological:  Does not bruise/bleed easily.  Psychiatric/Behavioral:  The patient is not nervous/anxious.    Blood pressure (!) 137/106, pulse 71, temperature (!) 93.5 F (34.2 C), temperature source Rectal, resp. rate 15, height 5\' 3"  (1.6 m), weight 65.8 kg, SpO2 100%. Physical Exam Constitutional:      General: She is not in acute distress.    Appearance: She is well-developed. She is not diaphoretic.  HENT:     Head: Normocephalic and atraumatic.  Eyes:     General: No scleral icterus.       Right eye: No discharge.        Left eye: No discharge.     Conjunctiva/sclera: Conjunctivae normal.  Cardiovascular:     Rate and Rhythm: Normal rate and regular rhythm.  Pulmonary:     Effort: Pulmonary effort is normal. No respiratory distress.  Musculoskeletal:     Cervical back: Normal range of motion.     Comments: RLE No traumatic wounds, ecchymosis, or rash  Mod TTP hip  No knee or ankle effusion  Knee stable to varus/ valgus and anterior/posterior stress  Sens DPN, SPN, TN intact  Motor EHL, ext, flex, evers 5/5  DP 1+, PT 0, No significant edema  Skin:    General: Skin is warm and dry.  Neurological:     Mental Status: She is alert.  Psychiatric:        Mood and Affect: Mood normal.         Behavior: Behavior normal.     Assessment/Plan: Right hip fx -- Plan IMN tomorrow with Dr. Hulda Humphrey. Please keep NPO after MN.    Freeman Caldron, PA-C Orthopedic Surgery [phone] 10/14/2023, 1:23 PM

## 2023-10-14 NOTE — Plan of Care (Signed)
  Problem: Education: Goal: Knowledge of General Education information will improve Description: Including pain rating scale, medication(s)/side effects and non-pharmacologic comfort measures Outcome: Progressing   Problem: Clinical Measurements: Goal: Will remain free from infection Outcome: Progressing   Problem: Activity: Goal: Risk for activity intolerance will decrease Outcome: Progressing   Problem: Nutrition: Goal: Adequate nutrition will be maintained Outcome: Progressing   Problem: Coping: Goal: Level of anxiety will decrease Outcome: Progressing   Problem: Elimination: Goal: Will not experience complications related to bowel motility Outcome: Progressing   Problem: Pain Managment: Goal: General experience of comfort will improve and/or be controlled Outcome: Progressing

## 2023-10-14 NOTE — ED Provider Notes (Signed)
[place] EMERGENCY DEPARTMENT AT [facility] Provider Note   CSN: [id number] Arrival date & time: 10/14/23  1103     History {Add pertinent medical, surgical, social history, OB history to HPI:1} No chief complaint on file.   Michelle Zavala is a 78 y.o. female.  78 yo F with a chief complaints of a fall.  The patient was walking at home and she tripped over her own feet and fell onto her right side.  Complaining mostly of pain to the right hip but also struck the right side of her head and has right shoulder pain.  She does have pain to the right side of the neck.  Denies chest pain back pain abdominal pain.        Home Medications Prior to Admission medications   Medication Sig Start Date End Date Taking? Authorizing Provider  acetaminophen (TYLENOL) 650 MG CR tablet Take 1,300 mg by mouth every 8 (eight) hours as needed for pain.     [provider]  albuterol (VENTOLIN HFA) 108 (90 Base) MCG/ACT inhaler Inhale 2 puffs into the lungs every 4 (four) hours as needed for wheezing or shortness of breath.    [provider]  amitriptyline (ELAVIL) 75 MG tablet Take 75 mg by mouth at bedtime. 01/27/15   [provider]  bisoprolol (ZEBETA) 5 MG tablet Take 1 tablet (5 mg total) by mouth daily. 08/10/23   Jonelle Sidle, MD  furosemide (LASIX) 20 MG tablet Take 1 tablet (20 mg total) by mouth every other day. 08/10/23   Jonelle Sidle, MD  ipratropium (ATROVENT) 0.02 % nebulizer solution Take 0.5 mg by nebulization. 08/09/23   [provider]  ipratropium-albuterol (DUONEB) 0.5-2.5 (3) MG/3ML SOLN Take 3 mLs by nebulization every 6 (six) hours as needed for up to 60 doses. 09/15/22   Carmel Sacramento A, PA-C  levocetirizine (XYZAL) 5 MG tablet Take 5 mg by mouth at bedtime. 11/01/22   [provider]  linaclotide Karlene Einstein) 145 MCG CAPS capsule TAKE ONE CAPSULE BY MOUTH DAILY BEFORE BREAKFAST 08/31/23   Rourk, Gerrit Friends, MD   LORazepam (ATIVAN) 1 MG tablet Take 1 mg by mouth 2 (two) times daily. 10/22/22   [provider]  Magnesium 250 MG TABS Take 1 tablet by mouth daily.    [provider]  memantine (NAMENDA) 5 MG tablet Take 1 tablet (5 mg total) by mouth at bedtime. 09/27/23   Marcos Eke, PA-C  Multiple Vitamins-Minerals (PRESERVISION/LUTEIN PO) Take 1 tablet by mouth 2 (two) times daily.    [provider]  naproxen (NAPROSYN) 250 MG tablet SMARTSIG:1 Tablet(s) By Mouth Every 12 Hours PRN 04/18/23   [provider]  ondansetron (ZOFRAN) 4 MG tablet Take 4 mg by mouth every 8 (eight) hours as needed for nausea. 10/25/22   [provider]  pantoprazole (PROTONIX) 40 MG tablet Take 40 mg by mouth 2 (two) times daily. 07/30/22   [provider]  potassium chloride SA (KLOR-CON M) 20 MEQ tablet Take 1 tablet (20 mEq total) by mouth every other day. 08/10/23   Jonelle Sidle, MD  sertraline (ZOLOFT) 100 MG tablet Take 100 mg by mouth 2 (two) times daily.    [provider]  spironolactone (ALDACTONE) 25 MG tablet Take 1 tablet (25 mg total) by mouth daily. 08/10/23   Jonelle Sidle, MD      Allergies    Sulfonamide derivatives, Atorvastatin, Crestor [rosuvastatin], Entresto [sacubitril-valsartan], and [place] [dapagliflozin]  Review of Systems   Review of Systems  Physical Exam Updated Vital Signs There were no vitals taken for this visit. Physical Exam Vitals and nursing note reviewed.  Constitutional:      General: She is not in acute distress.    Appearance: She is well-developed. She is not diaphoretic.  HENT:     Head: Normocephalic.     Comments: Right temporal laceration Eyes:     Pupils: Pupils are equal, round, and reactive to light.  Cardiovascular:     Rate and Rhythm: Normal rate and regular rhythm.     Heart sounds: No murmur heard.    No friction rub. No gallop.  Pulmonary:     Effort: Pulmonary effort is normal.      Breath sounds: No wheezing or rales.  Abdominal:     General: There is no distension.     Palpations: Abdomen is soft.     Tenderness: There is no abdominal tenderness.  Musculoskeletal:        General: No tenderness.     Cervical back: Normal range of motion and neck supple.     Comments: Deformity to the right hip.  Intact motor and sensation.  Difficult to palpate pulses on bilateral lower extremities.  Skin:    General: Skin is warm and dry.  Neurological:     Mental Status: She is alert and oriented to person, place, and time.  Psychiatric:        Behavior: Behavior normal.     ED Results / Procedures / Treatments   Labs (all labs ordered are listed, but only abnormal results are displayed) Labs Reviewed  CBC WITH DIFFERENTIAL/PLATELET  BASIC METABOLIC PANEL    EKG None  Radiology No results found.  Procedures .Laceration Repair  Date/Time: 10/14/2023 2:14 PM  Performed by: Melene Plan, DO Authorized by: Melene Plan, DO   Consent:    Consent obtained:  Verbal   Consent given by:  Patient   Risks, benefits, and alternatives were discussed: yes     Risks discussed:  Infection, pain, poor cosmetic result and poor wound healing   Alternatives discussed:  No treatment Universal protocol:    Procedure explained and questions answered to patient or proxy's satisfaction: yes     Immediately prior to procedure, a time out was called: yes     Patient identity confirmed:  Verbally with patient Laceration details:    Location:  Face   Face location:  Forehead   Length (cm):  3.2 Pre-procedure details:    Preparation:  Patient was prepped and draped in usual sterile fashion Exploration:    Hemostasis achieved with:  Epinephrine and direct pressure   Imaging obtained comment:  CT   Wound exploration: entire depth of wound visualized     Wound extent: no underlying fracture   Treatment:    Area cleansed with:  Chlorhexidine   Amount of cleaning:  Standard    Irrigation solution:  Sterile saline   Irrigation volume:  150   Irrigation method:  Pressure wash   Debridement:  None   Undermining:  None   Scar revision: no   Skin repair:    Repair method:  Sutures   Suture size:  5-0   Suture material:  Fast-absorbing gut   Suture technique:  Simple interrupted   Number of sutures:  3 Approximation:    Approximation:  Close Repair type:    Repair type:  Simple Post-procedure details:    Dressing:  Antibiotic ointment  and adhesive bandage   Procedure completion:  Tolerated well, no immediate complications   {Document cardiac monitor, telemetry assessment procedure when appropriate:1}  Medications Ordered in ED Medications  lidocaine-EPINEPHrine (XYLOCAINE W/EPI) 2 %-1:200000 (PF) injection 10 mL (has no administration in time range)  morphine (PF) 4 MG/ML injection 4 mg (has no administration in time range)  ondansetron (ZOFRAN) injection 4 mg (has no administration in time range)    ED Course/ Medical Decision Making/ A&P   {   Click here for ABCD2, HEART and other calculatorsREFRESH Note before signing :1}                              Medical Decision Making Amount and/or Complexity of Data Reviewed Labs: ordered. Radiology: ordered. ECG/medicine tests: ordered.  Risk Prescription drug management.   78 yo F with a cc of a fall.  Nonsyncopal by history.  Patient has a right hip fracture clinically on exam.  Confirmed on plain film independently interpreted by me intertrochanteric fracture with significant displacement.  Discussed with orthopedics.  CT of the head and C-spine without obvious intracranial hemorrhage or C-spine fracture.  Hbg slightly trended downward.  No hx of bleeding.  ? Due to femur fracture.   Wound repaired at bedside.     {Document critical care time when appropriate:1} {Document review of labs and clinical decision tools ie heart score, Chads2Vasc2 etc:1}  {Document your independent review of  radiology images, and any outside records:1} {Document your discussion with family members, caretakers, and with consultants:1} {Document social determinants of health affecting pt's care:1} {Document your decision making why or why not admission, treatments were needed:1} Final Clinical Impression(s) / ED Diagnoses Final diagnoses:  None    Rx / DC Orders ED Discharge Orders     None

## 2023-10-14 NOTE — ED Notes (Signed)
ED TO INPATIENT HANDOFF REPORT  ED Nurse Name and Phone #: [phone]  S Name/Age/Gender Michelle Zavala 78 y.o. female Room/Bed: 017C/017C  Code Status   Code Status: Full Code  Home/SNF/Other Home Patient oriented to: self, place, time, and situation Is this baseline? Yes   Triage Complete: Triage complete  Chief Complaint Closed right hip fracture [facility]) [S72.001A]  Triage Note Patient presents to ed via [facility] states she was going to the bathroom and tripped over her own feet , unsure what she hit her head on small hematoma with small laceration to her right forehead. Denies loc not on thinners. C/o right shoulder and right hip pain. Positive right  radial and right pedal pulse.    Allergies Allergies  Allergen Reactions   Sulfonamide Derivatives Hives   Atorvastatin     Myalgias   Crestor [Rosuvastatin]     Myalgias   Entresto [Sacubitril-Valsartan]     Dizziness   Farxiga [Dapagliflozin]     Weakness    Level of Care/Admitting Diagnosis ED Disposition     ED Disposition  Admit   Condition  --   Comment  Hospital Area: [facility] [[id number]]  Level of Care: Telemetry Surgical [[id number]]  May admit patient to [facility] or [facility] if equivalent level of care is available:: No  Covid Evaluation: Asymptomatic - no recent exposure (last 10 days) testing not required  Diagnosis: Closed right hip fracture [facility]) [[id number]]  Admitting Physician: Clydie Braun [[id number]]  Attending Physician: Clydie Braun [[id number]]  Certification:: I certify this patient will need inpatient services for at least 2 midnights  Expected Medical Readiness: 10/17/2023          B Medical/Surgery History Past Medical History:  Diagnosis Date   Anxiety    Arthritis    C2 cervical fracture (HCC) 09/17/2013   Traumatic fracture witth minimal displacement   Cardiomyopathy (HCC)    a. EF 35-40% by echo in 08/2021   Chronic lung disease    Fibrosis - Dr.  Orson Aloe   COPD (chronic obstructive pulmonary disease) (HCC)    Coronary atherosclerosis of native coronary artery    a. Mild atherosclerosis by cath in 2010 b. NST in 07/2021 showing fixed defects but EF at 41% by NST and 35-40% by echo   Depression    Diverticulosis    Dyspnea    Essential hypertension    GERD (gastroesophageal reflux disease)    PSVT (paroxysmal supraventricular tachycardia) (HCC)    Past Surgical History:  Procedure Laterality Date   ABDOMINAL HYSTERECTOMY     ANTERIOR RELEASE VERTEBRAL BODY W/ POSTERIOR FUSION     BACTERIAL OVERGROWTH TEST N/A 02/19/2016   Procedure: BACTERIAL OVERGROWTH TEST;  Surgeon: Corbin Ade, MD;  [facility];  Service: Endoscopy;  Laterality: N/A;  0700   BIOPSY N/A 08/04/2015   Procedure: BIOPSY;  Surgeon: Corbin Ade, MD;  Location: [facility];  Service: Endoscopy;  Laterality: N/A;  Gastric   BIOPSY  02/21/2017   Procedure: BIOPSY;  Surgeon: Corbin Ade, MD;  [facility];  Service: Endoscopy;;  ascending colon biopsy    BIOPSY  08/21/2020   Procedure: BIOPSY;  Surgeon: Corbin Ade, MD;  [facility];  Service: Endoscopy;;   cataract surgery     CESAREAN SECTION     CHOLECYSTECTOMY  1973   COLONOSCOPY  June 2016   Dr. Teena Dunk: moderate diverticulosis in sigmoid colon, surveillance in 5 years  COLONOSCOPY WITH PROPOFOL N/A 02/21/2017   Dr. Jena Gauss: diverticulosis in sigmoid colon, tubular adenomas, segmental biopsies benign. Surveillance in 5 years    COLONOSCOPY WITH PROPOFOL N/A 02/18/2023   Procedure: COLONOSCOPY WITH PROPOFOL;  Surgeon: Corbin Ade, MD;  [facility];  Service: Endoscopy;  Laterality: N/A;  10:00 am, asa 3   ESOPHAGOGASTRODUODENOSCOPY (EGD) WITH PROPOFOL N/A 08/04/2015   Dr. Rourk:abnormal gastric mucosa s/p biopsy. Reactive gastropathy. Negative H.pylori   ESOPHAGOGASTRODUODENOSCOPY (EGD) WITH PROPOFOL N/A 02/21/2017   Dr. Jena Gauss: empiric esophageal dilatation,  small hiatal hernia, otherwise normal   ESOPHAGOGASTRODUODENOSCOPY (EGD) WITH PROPOFOL N/A 08/21/2020   normal esophagus s/p dilation. Erythematous mucosa in stomach of doubtful clinical significant s/p biopsy. Negative H.pylori. Mild reactive gastropathy.    ESOPHAGOGASTRODUODENOSCOPY (EGD) WITH PROPOFOL N/A 02/18/2023   Procedure: ESOPHAGOGASTRODUODENOSCOPY (EGD) WITH PROPOFOL;  Surgeon: Corbin Ade, MD;  [facility];  Service: Endoscopy;  Laterality: N/A;   HIP PINNING,CANNULATED Left 06/11/2022   Procedure: PERCUTANEOUS FIXATION OF FEMORAL NECK;  Surgeon: Vickki Hearing, MD;  Location: [facility];  Service: Orthopedics;  Laterality: Left;   INCISIONAL HERNIA REPAIR N/A 12/03/2015   Procedure: Sherald Hess HERNIORRHAPHY WITH MESH;  Surgeon: Franky Macho, MD;  Location: [facility];  Service: General;  Laterality: N/A;   INSERTION OF MESH  12/03/2015   Procedure: INSERTION OF MESH;  Surgeon: Franky Macho, MD;  Location: [facility];  Service: General;;   IR RADIOLOGIST EVAL & MGMT  09/22/2017   MALONEY DILATION N/A 02/21/2017   Procedure: Elease Hashimoto DILATION;  Surgeon: Corbin Ade, MD;  [facility];  Service: Endoscopy;  Laterality: N/A;   MALONEY DILATION N/A 08/21/2020   Procedure: Elease Hashimoto DILATION;  Surgeon: Corbin Ade, MD;  [facility];  Service: Endoscopy;  Laterality: N/A;   POLYPECTOMY  02/21/2017   Procedure: POLYPECTOMY;  Surgeon: Corbin Ade, MD;  [facility];  Service: Endoscopy;;  hepatic flexure polyp cs   RIGHT/LEFT HEART CATH AND CORONARY ANGIOGRAPHY N/A 09/17/2021   Procedure: RIGHT/LEFT HEART CATH AND CORONARY ANGIOGRAPHY;  Surgeon: Yvonne Kendall, MD;  [facility]: [facility];  Service: Cardiovascular;  Laterality: N/A;   TMJ ARTHROSCOPY Bilateral 02/04/2015   Procedure: BILATERAL TEMPOROMANDIBULAR JOINT (TMJ) ARTHROSCOPY MENISECTOMY WITH FAT GRAFT FROM ABDOMEN ;  Surgeon: Ocie Doyne, DDS;  Location: [facility] OR;  Service: Oral  Surgery;  Laterality: Bilateral;   TOTAL KNEE ARTHROPLASTY Bilateral    TUBAL LIGATION       A IV Location/Drains/Wounds Patient Lines/Drains/Airways Status     Active Line/Drains/Airways     Name Placement date Placement time Site Days   Peripheral IV 10/14/23 18 G Left Antecubital 10/14/23  1141  Antecubital  less than 1   Wound 09/20/13 Excoriated (Scratch marks) Other (Comment) Medial buttocks and perineum excoriated, weeping 09/20/13  1000  Other (Comment)  [id number]            Intake/Output Last 24 hours No intake or output data in the 24 hours ending 10/14/23 1457  Labs/Imaging Results for orders placed or performed during the hospital encounter of 10/14/23 (from the past 48 hours)  CBC with Differential     Status: Abnormal   Collection Time: 10/14/23 11:14 AM  Result Value Ref Range   WBC 17.3 (H) 4.0 - 10.5 K/uL   RBC 3.28 (L) 3.87 - 5.11 MIL/uL   Hemoglobin 9.6 (L) 12.0 - 15.0 g/dL   HCT 16.1 (L) 09.6 - 04.5 %   MCV  89.0 80.0 - 100.0 fL   MCH 29.3 26.0 - 34.0 pg   MCHC 32.9 30.0 - 36.0 g/dL   RDW 16.1 09.6 - 04.5 %   Platelets 197 150 - 400 K/uL   nRBC 0.0 0.0 - 0.2 %   Neutrophils Relative % 88 %   Neutro Abs 15.1 (H) 1.7 - 7.7 K/uL   Lymphocytes Relative 5 %   Lymphs Abs 0.9 0.7 - 4.0 K/uL   Monocytes Relative 6 %   Monocytes Absolute 1.0 0.1 - 1.0 K/uL   Eosinophils Relative 0 %   Eosinophils Absolute 0.0 0.0 - 0.5 K/uL   Basophils Relative 0 %   Basophils Absolute 0.1 0.0 - 0.1 K/uL   Immature Granulocytes 1 %   Abs Immature Granulocytes 0.12 (H) 0.00 - 0.07 K/uL    Comment: Performed at [facility], [address], [place], [place] [address]  Basic metabolic panel     Status: Abnormal   Collection Time: 10/14/23 11:14 AM  Result Value Ref Range   Sodium 136 135 - 145 mmol/L   Potassium 4.9 3.5 - 5.1 mmol/L   Chloride 107 98 - 111 mmol/L   CO2 18 (L) 22 - 32 mmol/L   Glucose, Bld 125 (H) 70 - 99 mg/dL    Comment: Glucose reference range  applies only to samples taken after fasting for at least 8 hours.   BUN 27 (H) 8 - 23 mg/dL   Creatinine, Ser 1.91 (H) 0.44 - 1.00 mg/dL   Calcium 9.5 8.9 - 47.8 mg/dL   GFR, Estimated 54 (L) >60 mL/min    Comment: (NOTE) Calculated using the CKD-EPI Creatinine Equation (2021)    Anion gap 11 5 - 15    Comment: Performed at [facility], [address], [place], [place] [address]   CT Head Wo Contrast Result Date: 10/14/2023 CLINICAL DATA:  Fall, with small hematoma and laceration to right forehead EXAM: CT HEAD WITHOUT CONTRAST CT CERVICAL SPINE WITHOUT CONTRAST TECHNIQUE: Multidetector CT imaging of the head and cervical spine was performed following the standard protocol without intravenous contrast. Multiplanar CT image reconstructions of the cervical spine were also generated. RADIATION DOSE REDUCTION: This exam was performed according to the departmental dose-optimization program which includes automated exposure control, adjustment of the mA and/or kV according to patient size and/or use of iterative reconstruction technique. COMPARISON:  10/18/2022 CT head and cervical spine FINDINGS: CT HEAD FINDINGS Brain: No evidence of acute infarct, hemorrhage, mass, mass effect, or midline shift. No hydrocephalus or extra-axial fluid collection. Periventricular white matter changes, likely the sequela of chronic small vessel ischemic disease. Vascular: No hyperdense vessel. Atherosclerotic calcifications in the intracranial carotid and vertebral arteries. Skull: Negative for fracture or focal lesion. Small bilateral forehead hematomas with right forehead laceration. Sinuses/Orbits: Mild mucosal thickening in the ethmoid air cells. No acute finding in the orbits. Other: The mastoid air cells are well aerated. CT CERVICAL SPINE FINDINGS Alignment: No traumatic listhesis. Skull base and vertebrae: No acute fracture or suspicious osseous lesion. ACDF C5-C7. Additional osseous fusion of C3-C5. C6-C7  spinous process cerclage. Soft tissues and spinal canal: No prevertebral fluid or swelling. No visible canal hematoma. Disc levels: Degenerative changes in the cervical spine.No high-grade spinal canal stenosis. Upper chest: No focal pulmonary opacity or pleural effusion. IMPRESSION: 1. No acute intracranial process. 2. No acute fracture or traumatic listhesis in the cervical spine. 3. Small bilateral forehead hematomas with right forehead laceration. Electronically Signed  By: Wiliam Ke M.D.   On: 10/14/2023 13:23   CT Cervical Spine Wo Contrast Result Date: 10/14/2023 CLINICAL DATA:  Fall, with small hematoma and laceration to right forehead EXAM: CT HEAD WITHOUT CONTRAST CT CERVICAL SPINE WITHOUT CONTRAST TECHNIQUE: Multidetector CT imaging of the head and cervical spine was performed following the standard protocol without intravenous contrast. Multiplanar CT image reconstructions of the cervical spine were also generated. RADIATION DOSE REDUCTION: This exam was performed according to the departmental dose-optimization program which includes automated exposure control, adjustment of the mA and/or kV according to patient size and/or use of iterative reconstruction technique. COMPARISON:  10/18/2022 CT head and cervical spine FINDINGS: CT HEAD FINDINGS Brain: No evidence of acute infarct, hemorrhage, mass, mass effect, or midline shift. No hydrocephalus or extra-axial fluid collection. Periventricular white matter changes, likely the sequela of chronic small vessel ischemic disease. Vascular: No hyperdense vessel. Atherosclerotic calcifications in the intracranial carotid and vertebral arteries. Skull: Negative for fracture or focal lesion. Small bilateral forehead hematomas with right forehead laceration. Sinuses/Orbits: Mild mucosal thickening in the ethmoid air cells. No acute finding in the orbits. Other: The mastoid air cells are well aerated. CT CERVICAL SPINE FINDINGS Alignment: No traumatic  listhesis. Skull base and vertebrae: No acute fracture or suspicious osseous lesion. ACDF C5-C7. Additional osseous fusion of C3-C5. C6-C7 spinous process cerclage. Soft tissues and spinal canal: No prevertebral fluid or swelling. No visible canal hematoma. Disc levels: Degenerative changes in the cervical spine.No high-grade spinal canal stenosis. Upper chest: No focal pulmonary opacity or pleural effusion. IMPRESSION: 1. No acute intracranial process. 2. No acute fracture or traumatic listhesis in the cervical spine. 3. Small bilateral forehead hematomas with right forehead laceration. Electronically Signed   By: Wiliam Ke M.D.   On: 10/14/2023 13:23   DG Chest Port 1 View Result Date: 10/14/2023 CLINICAL DATA:  Fall. EXAM: PORTABLE CHEST 1 VIEW COMPARISON:  Chest radiograph dated 09/19/2023. FINDINGS: The heart size and mediastinal contours are within normal limits. Aortic atherosclerosis. No focal consolidation, sizeable pleural effusion, or pneumothorax. Severe degenerative changes of the right glenohumeral joint. Partially visualized postoperative changes of the cervical spine. No acute osseous abnormality identified. IMPRESSION: No acute findings in the chest. Electronically Signed   By: Hart Robinsons M.D.   On: 10/14/2023 12:26   DG Shoulder Right Port Result Date: 10/14/2023 CLINICAL DATA:  Shoulder pain status post fall. EXAM: RIGHT SHOULDER - 1 VIEW COMPARISON:  10/10/2023. FINDINGS: There is no evidence of acute fracture or dislocation. Severe degenerative changes of the right glenohumeral joint are again noted with severe joint space narrowing, marginal osteophytosis, and subchondral sclerosis. Similar high-riding humeral head, likely relates to chronic rotator cuff pathology. Moderate degenerative changes of the acromioclavicular joint. Postoperative changes of the cervical spine. IMPRESSION: 1. No acute osseous abnormality. 2. Severe degenerative changes of the glenohumeral joint. 3.  Similar high-riding humeral head, likely relates to chronic rotator cuff pathology. 4. Moderate degenerative changes of the acromioclavicular joint. Electronically Signed   By: Hart Robinsons M.D.   On: 10/14/2023 12:22   DG Hip [place] or [place] Pelvis 1 View Right Result Date: 10/14/2023 CLINICAL DATA:  Hip pain status post fall. EXAM: DG HIP (WITH OR WITHOUT PELVIS) 1V PORT RIGHT COMPARISON:  09/19/2023. FINDINGS: Diffuse osseous demineralization. There is an acute comminuted and impacted intertrochanteric fracture of the right proximal femur. There is approximately 5.5 cm of superior displacement of the distal fracture component with apex varus angulation. The right femoral  head is seated within the acetabulum. Status post ORIF of the left proximal femur. The sacroiliac joints and pubic symphysis are anatomically aligned. Degenerative changes of the visualized lower lumbar spine. IMPRESSION: Acute comminuted, displaced and impacted intertrochanteric fracture of the right proximal femur. Electronically Signed   By: Hart Robinsons M.D.   On: 10/14/2023 12:18    Pending Labs Unresulted Labs (From admission, onward)    None       Vitals/Pain Today's Vitals   10/14/23 1230 10/14/23 1315 10/14/23 1330 10/14/23 1400  BP: 131/69 (!) 137/106 120/66 125/80  Pulse: 64 71 (!) 32 72  Resp: 13 15 13 17   Temp:      TempSrc:      SpO2: 96% 100% (!) 87% 100%  Weight:      Height:      PainSc:        Isolation Precautions No active isolations  Medications Medications  lidocaine-EPINEPHrine (XYLOCAINE W/EPI) 2 %-1:200000 (PF) injection 10 mL (10 mLs Intradermal Given 10/14/23 1415)  morphine (PF) 4 MG/ML injection 4 mg (4 mg Intravenous Given 10/14/23 1159)  ondansetron (ZOFRAN) injection 4 mg (4 mg Intravenous Given 10/14/23 1158)    Mobility walks with device     Focused Assessments    R Recommendations: See Admitting Provider Note  Report given to:   Additional Notes:  Patient is alert oriented

## 2023-10-14 NOTE — H&P (Signed)
History and Physical    Patient: Michelle Zavala ZHY:[id number] DOB: March 15, 1946 DOA: 10/14/2023 DOS: the patient was seen and examined on 10/14/2023 PCP: Kirstie Peri, MD  Patient coming from: Home  Chief Complaint:  Chief Complaint  Patient presents with   Fall   HPI: Michelle Zavala is a 78 y.o. female with medical history significant of hypertension, CAD, paroxysmal atrial fibrillation, PSVT, COPD, AAA, and GERD who presents after having a fall at home with right hip pain.  She was in the kitchen attempting to clean when she slipped on water on the floor, resulting in a fall.  Of note patient told the prior provider that she tripped over her feet.  She hit the right side of her forehead, but did not lose consciousness. Following the fall, she reported having severe pain in her right hip for which she was unable to get up.  Denies having any complaints of fever, chest pain, palpitations, shortness of breath, nausea, vomiting, diarrhea, or blood in stools/urine.  On admission into the emergency department patient was noted to have temperature of 93.5 F with heart rates 59-71, and all other vital signs maintained.  Labs significant for WBC 17.3, hemoglobin 9.6, BUN 27, creatinine 1.05.  X-rays of the right shoulder showed no acute abnormality, severe degenerative changes in the glenohumeral joint.  X-rays of the hip acute communicated displaced and impacted intertrochanteric fracture of the right proximal femur.  Earney Hamburg, PA-C and notes Dr. Hulda Humphrey recommends keeping patient n.p.o. after midnight for cephalomedullary nail surgery in a.m.  Review of Systems: As mentioned in the history of present illness. All other systems reviewed and are negative. Past Medical History:  Diagnosis Date   Anxiety    Arthritis    C2 cervical fracture (HCC) 09/17/2013   Traumatic fracture witth minimal displacement   Cardiomyopathy (HCC)    a. EF 35-40% by echo in 08/2021   Chronic lung disease    Fibrosis - Dr.  Orson Aloe   COPD (chronic obstructive pulmonary disease) (HCC)    Coronary atherosclerosis of native coronary artery    a. Mild atherosclerosis by cath in 2010 b. NST in 07/2021 showing fixed defects but EF at 41% by NST and 35-40% by echo   Depression    Diverticulosis    Dyspnea    Essential hypertension    GERD (gastroesophageal reflux disease)    PSVT (paroxysmal supraventricular tachycardia) (HCC)    Past Surgical History:  Procedure Laterality Date   ABDOMINAL HYSTERECTOMY     ANTERIOR RELEASE VERTEBRAL BODY W/ POSTERIOR FUSION     BACTERIAL OVERGROWTH TEST N/A 02/19/2016   Procedure: BACTERIAL OVERGROWTH TEST;  Surgeon: Corbin Ade, MD;  Location: [facility];  Service: Endoscopy;  Laterality: N/A;  0700   BIOPSY N/A 08/04/2015   Procedure: BIOPSY;  Surgeon: Corbin Ade, MD;  Location: [facility];  Service: Endoscopy;  Laterality: N/A;  Gastric   BIOPSY  02/21/2017   Procedure: BIOPSY;  Surgeon: Corbin Ade, MD;  [facility]: [facility];  Service: Endoscopy;;  ascending colon biopsy    BIOPSY  08/21/2020   Procedure: BIOPSY;  Surgeon: Corbin Ade, MD;  [facility]: [facility];  Service: Endoscopy;;   cataract surgery     CESAREAN SECTION     CHOLECYSTECTOMY  1973   COLONOSCOPY  June 2016   Dr. Teena Dunk: moderate diverticulosis in sigmoid colon, surveillance in 5 years    COLONOSCOPY WITH PROPOFOL N/A 02/21/2017   Dr. Jena Gauss: diverticulosis in sigmoid  colon, tubular adenomas, segmental biopsies benign. Surveillance in 5 years    COLONOSCOPY WITH PROPOFOL N/A 02/18/2023   Procedure: COLONOSCOPY WITH PROPOFOL;  Surgeon: Corbin Ade, MD;  [facility]: [facility];  Service: Endoscopy;  Laterality: N/A;  10:00 am, asa 3   ESOPHAGOGASTRODUODENOSCOPY (EGD) WITH PROPOFOL N/A 08/04/2015   Dr. Rourk:abnormal gastric mucosa s/p biopsy. Reactive gastropathy. Negative H.pylori   ESOPHAGOGASTRODUODENOSCOPY (EGD) WITH PROPOFOL N/A 02/21/2017   Dr. Jena Gauss: empiric esophageal dilatation,  small hiatal hernia, otherwise normal   ESOPHAGOGASTRODUODENOSCOPY (EGD) WITH PROPOFOL N/A 08/21/2020   normal esophagus s/p dilation. Erythematous mucosa in stomach of doubtful clinical significant s/p biopsy. Negative H.pylori. Mild reactive gastropathy.    ESOPHAGOGASTRODUODENOSCOPY (EGD) WITH PROPOFOL N/A 02/18/2023   Procedure: ESOPHAGOGASTRODUODENOSCOPY (EGD) WITH PROPOFOL;  Surgeon: Corbin Ade, MD;  [facility]: [facility];  Service: Endoscopy;  Laterality: N/A;   HIP PINNING,CANNULATED Left 06/11/2022   Procedure: PERCUTANEOUS FIXATION OF FEMORAL NECK;  Surgeon: Vickki Hearing, MD;  Location: [facility];  Service: Orthopedics;  Laterality: Left;   INCISIONAL HERNIA REPAIR N/A 12/03/2015   Procedure: Sherald Hess HERNIORRHAPHY WITH MESH;  Surgeon: Franky Macho, MD;  Location: [facility];  Service: General;  Laterality: N/A;   INSERTION OF MESH  12/03/2015   Procedure: INSERTION OF MESH;  Surgeon: Franky Macho, MD;  Location: [facility];  Service: General;;   IR RADIOLOGIST EVAL & MGMT  09/22/2017   MALONEY DILATION N/A 02/21/2017   Procedure: Elease Hashimoto DILATION;  Surgeon: Corbin Ade, MD;  [facility]: [facility];  Service: Endoscopy;  Laterality: N/A;   MALONEY DILATION N/A 08/21/2020   Procedure: Elease Hashimoto DILATION;  Surgeon: Corbin Ade, MD;  [facility]: [facility];  Service: Endoscopy;  Laterality: N/A;   POLYPECTOMY  02/21/2017   Procedure: POLYPECTOMY;  Surgeon: Corbin Ade, MD;  [facility];  Service: Endoscopy;;  hepatic flexure polyp cs   RIGHT/LEFT HEART CATH AND CORONARY ANGIOGRAPHY N/A 09/17/2021   Procedure: RIGHT/LEFT HEART CATH AND CORONARY ANGIOGRAPHY;  Surgeon: Yvonne Kendall, MD;  Location: [facility];  Service: Cardiovascular;  Laterality: N/A;   TMJ ARTHROSCOPY Bilateral 02/04/2015   Procedure: BILATERAL TEMPOROMANDIBULAR JOINT (TMJ) ARTHROSCOPY MENISECTOMY WITH FAT GRAFT FROM ABDOMEN ;  Surgeon: Ocie Doyne, DDS;  Location: [facility];  Service: Oral  Surgery;  Laterality: Bilateral;   TOTAL KNEE ARTHROPLASTY Bilateral    TUBAL LIGATION     Social History:  reports that she has never smoked. She has never been exposed to tobacco smoke. She has never used smokeless tobacco. She reports that she does not currently use alcohol. She reports that she does not use drugs.  Allergies  Allergen Reactions   Sulfonamide Derivatives Hives   Atorvastatin     Myalgias   Crestor [Rosuvastatin]     Myalgias   Entresto [Sacubitril-Valsartan]     Dizziness   Farxiga [Dapagliflozin]     Weakness    Family History  Problem Relation Age of Onset   Coronary artery disease Father        Premature   Heart disease Father    Coronary artery disease Brother        Premature   Coronary artery disease Sister        Premature   Colon cancer Son     Prior to Admission medications   Medication Sig Start Date End Date Taking? Authorizing Provider  acetaminophen (TYLENOL) 650 MG CR tablet Take 1,300 mg by mouth every 8 (eight) hours as  needed for pain.     [provider]  albuterol (VENTOLIN HFA) 108 (90 Base) MCG/ACT inhaler Inhale 2 puffs into the lungs every 4 (four) hours as needed for wheezing or shortness of breath.    [provider]  amitriptyline (ELAVIL) 75 MG tablet Take 75 mg by mouth at bedtime. 01/27/15   [provider]  bisoprolol (ZEBETA) 5 MG tablet Take 1 tablet (5 mg total) by mouth daily. 08/10/23   Jonelle Sidle, MD  furosemide (LASIX) 20 MG tablet Take 1 tablet (20 mg total) by mouth every other day. 08/10/23   Jonelle Sidle, MD  ipratropium (ATROVENT) 0.02 % nebulizer solution Take 0.5 mg by nebulization. 08/09/23   [provider]  ipratropium-albuterol (DUONEB) 0.5-2.5 (3) MG/3ML SOLN Take 3 mLs by nebulization every 6 (six) hours as needed for up to 60 doses. 09/15/22   Carmel Sacramento A, PA-C  levocetirizine (XYZAL) 5 MG tablet Take 5 mg by mouth at bedtime. 11/01/22   [provider]  linaclotide Karlene Einstein) 145 MCG CAPS capsule TAKE ONE CAPSULE BY MOUTH DAILY BEFORE BREAKFAST 08/31/23   Rourk, Gerrit Friends, MD  LORazepam (ATIVAN) 1 MG tablet Take 1 mg by mouth 2 (two) times daily. 10/22/22   [provider]  Magnesium 250 MG TABS Take 1 tablet by mouth daily.    [provider]  memantine (NAMENDA) 5 MG tablet Take 1 tablet (5 mg total) by mouth at bedtime. 09/27/23   Marcos Eke, PA-C  Multiple Vitamins-Minerals (PRESERVISION/LUTEIN PO) Take 1 tablet by mouth 2 (two) times daily.    [provider]  naproxen (NAPROSYN) 250 MG tablet SMARTSIG:1 Tablet(s) By Mouth Every 12 Hours PRN 04/18/23   [provider]  ondansetron (ZOFRAN) 4 MG tablet Take 4 mg by mouth every 8 (eight) hours as needed for nausea. 10/25/22   [provider]  pantoprazole (PROTONIX) 40 MG tablet Take 40 mg by mouth 2 (two) times daily. 07/30/22   [provider]  potassium chloride SA (KLOR-CON M) 20 MEQ tablet Take 1 tablet (20 mEq total) by mouth every other day. 08/10/23   Jonelle Sidle, MD  sertraline (ZOLOFT) 100 MG tablet Take 100 mg by mouth 2 (two) times daily.    [provider]  spironolactone (ALDACTONE) 25 MG tablet Take 1 tablet (25 mg total) by mouth daily. 08/10/23   Jonelle Sidle, MD    Physical Exam: Vitals:   10/14/23 1200 10/14/23 1215 10/14/23 1230 10/14/23 1315  BP: 131/66 123/86 131/69 (!) 137/106  Pulse: (!) 59 63 64 71  Resp: 16 11 13 15   Temp:      TempSrc:      SpO2: 92% 92% 96% 100%  Weight:      Height:        Constitutional: NAD, calm, comfortable Eyes: PERRL, lids and conjunctivae normal ENMT: Mucous membranes are moist. Posterior pharynx clear of any exudate or lesions.Normal dentition.  Neck: normal, supple, no masses, no thyromegaly Respiratory: clear to auscultation bilaterally, no wheezing, no crackles. Normal respiratory effort. No accessory muscle use.  Cardiovascular: Regular  rate and rhythm, no murmurs / rubs / gallops. No extremity edema. 2+ pedal pulses. Abdomen: no tenderness, no masses palpated. No hepatosplenomegaly. Bowel sounds positive.  Musculoskeletal: no clubbing / cyanosis.  Tenderness to palpation of right hip. Skin: no rashes, lesions, ulcers. No induration Neurologic: CN 2-12 grossly intact. . Strength 5/5 in all 4.  Psychiatric: Normal judgment and insight. Alert  and oriented x 3. Normal mood.   Data Reviewed:  EKG reveals sinus rhythm 64 bpm with first-degree heart block.  Reviewed labs, imaging, and pertinent records as documented.  Assessment and Plan:  Right hip fracture secondary to fall Acute.  Patient presents after what she reports as a mechanical fall after slipping on water that was on the floor while she was cleaning this morning.  X-rays of the pelvis revealed acute communicated displaced and impacted intertrochanteric fracture of the right proximal femur. -Admit to a telemetry surgical bed -Hip fracture order set ordered -N.p.o. after midnight -Hydrocodone/morphine IV as needed for moderate to severe pain respectively -Follow-up telemetry overnight to ensure no signs of arrhythmia as a possible cause of patient's fall -Transitions of care consulted -Orthopedic consultative services follow-up for any further recommendations  Leukocytosis WBC elevated at 17.3.  Possibly reactive to above. -Check urinalysis -Recheck CBC tomorrow  Heart failure with reduced ejection fraction Patient appears to be euvolemic at this time. Last echocardiogram noted EF to be 35 to 40% with grade 1 diastolic dysfunction back in 08/2021.  Patient followed in the outpatient setting by Dr. Diona Browner. -Strict I&O's and daily weights  Essential hypertension Review of patient's last office records with cardiology from 08/10/2023 appears to show that she was recommended to start on bisoprolol 5 mg daily and discontinue metoprolol, but also see recent refill  for metoprolol 10/03/2023.  Similar patient reported taking hydrochlorothiazide although this was also supposed to be stopped and she was switched to Lasix 20 mg every other day and spironolactone daily. -Will need to address confusion with medication regimen -Medication regimen continued as appears recommended by cardiology last office visit  COPD Patient without significant wheezing noted on physical exam. -DuoNeb breathing treatments as needed for shortness of breath/wheezing  Normocytic anemia Acute on chronic.  Hemoglobin 9.6 on admission, but previously had been 11.1.  Patient denies any reports of bleeding. -Recheck H&H -Transitions of care consulted for likely need of medication monitoring due to inconsistencies reported by the patient in which she is and recent fill dates  Memory impairment Records note patient was just recently started on Namenda after following up with neurology 1/14 and was started on Namenda. -Delirium precautions  Anxiety and depression -Continue amitriptyline, Zoloft, and Ativan  Nonobstructive CAD Mixed hyperlipidemia  Last cardiac catheterization 09/2021 demonstrated mild to moderate nonobstructive coronary disease. Last LDL 177, but patient has statin induced myalgias.  Ascending AAA Patient has a 4.2 cm AAA seen on previous CTA of the chest in 09/2022. -Continue outpatient screening  GERD -Continue Protonix   DVT prophylaxis: Lovenox Advance Care Planning:   Code Status: Full Code    Consults: Orthopedics  Family Communication: None  Severity of Illness: The appropriate patient status for this patient is INPATIENT. Inpatient status is judged to be reasonable and necessary in order to provide the required intensity of service to ensure the patient's safety. The patient's presenting symptoms, physical exam findings, and initial radiographic and laboratory data in the context of their chronic comorbidities is felt to place them at high risk for  further clinical deterioration. Furthermore, it is not anticipated that the patient will be medically stable for discharge from the hospital within 2 midnights of admission.   * I certify that at the point of admission it is my clinical judgment that the patient will require inpatient hospital care spanning beyond 2 midnights from the point of admission due to high intensity of service, high risk for further deterioration and  high frequency of surveillance required.*  Author: Clydie Braun, MD 10/14/2023 2:15 PM  For on call review [email or web address].

## 2023-10-14 NOTE — Anesthesia Preprocedure Evaluation (Addendum)
Anesthesia Evaluation  Patient identified by MRN, date of birth, ID band Patient awake    Reviewed: Allergy & Precautions, NPO status , Patient's Chart, lab work & pertinent test results  Airway Mallampati: II  TM Distance: >3 FB Neck ROM: Limited    Dental  (+) Poor Dentition, Missing   Pulmonary COPD,  COPD inhaler   Pulmonary exam normal        Cardiovascular hypertension, Pt. on medications and Pt. on home beta blockers + CAD and + Peripheral Vascular Disease   Rhythm:Regular Rate:Normal  ECHO 2022:   1. Left ventricular ejection fraction, by estimation, is 35 to 40%. The  left ventricle has moderately decreased function. The left ventricle  demonstrates regional wall motion abnormalities (see scoring  diagram/findings for description). Left ventricular   diastolic parameters are consistent with Grade I diastolic dysfunction  (impaired relaxation). Abnormal global longitudinal strain of -9.8%.   2. Right ventricular systolic function is mildly reduced. The right  ventricular size is normal. There is normal pulmonary artery systolic  pressure. The estimated right ventricular systolic pressure is 23.2 mmHg.   3. Left atrial size was moderately dilated.   4. The mitral valve is abnormal. Trivial mitral valve regurgitation.   5. The aortic valve is tricuspid. Aortic valve regurgitation is mild to  moderate. Aortic regurgitation PHT measures 772 msec.   6. Aortic dilatation noted. There is moderate dilatation of the ascending  aorta, measuring 44 mm.   7. The inferior vena cava is normal in size with greater than 50%  respiratory variability, suggesting right atrial pressure of 3 mmHg.   Comparison(s): Prior images reviewed side by side. LVEF has decreased with  wall motion abnormalties suggestive of ischemic heart disease.     Neuro/Psych   Anxiety Depression    negative neurological ROS     GI/Hepatic Neg liver  ROS,GERD  Medicated,,  Endo/Other  negative endocrine ROS    Renal/GU negative Renal ROS  negative genitourinary   Musculoskeletal  (+) Arthritis , Osteoarthritis,  Right hip fracture   Prior cervical fusion   Abdominal Normal abdominal exam  (+)   Peds  Hematology Lab Results      Component                Value               Date                      WBC                      17.3 (H)            10/14/2023                HGB                      9.6 (L)             10/14/2023                HCT                      29.2 (L)            10/14/2023                MCV  89.0                10/14/2023                PLT                      197                 10/14/2023              Anesthesia Other Findings   Reproductive/Obstetrics                             Anesthesia Physical Anesthesia Plan  ASA: 3  Anesthesia Plan: General   Post-op Pain Management: Tylenol PO (pre-op)*   Induction: Intravenous  PONV Risk Score and Plan: 3 and Ondansetron, Dexamethasone and Treatment may vary due to age or medical condition  Airway Management Planned: Mask, Oral ETT and Video Laryngoscope Planned  Additional Equipment: None  Intra-op Plan:   Post-operative Plan: Extubation in OR  Informed Consent: I have reviewed the patients History and Physical, chart, labs and discussed the procedure including the risks, benefits and alternatives for the proposed anesthesia with the patient or authorized representative who has indicated his/her understanding and acceptance.     Dental advisory given  Plan Discussed with: CRNA  Anesthesia Plan Comments:        Anesthesia Quick Evaluation

## 2023-10-15 ENCOUNTER — Encounter (HOSPITAL_COMMUNITY): Payer: Self-pay | Admitting: Internal Medicine

## 2023-10-15 ENCOUNTER — Other Ambulatory Visit: Payer: Self-pay

## 2023-10-15 ENCOUNTER — Encounter (HOSPITAL_COMMUNITY)
Admission: EM | Disposition: A | Discharge status: Skilled Nursing Facility | Payer: Self-pay | Source: Home / Self Care | Attending: Internal Medicine

## 2023-10-15 ENCOUNTER — Inpatient Hospital Stay (HOSPITAL_COMMUNITY): Payer: 59 | Admitting: Anesthesiology

## 2023-10-15 ENCOUNTER — Inpatient Hospital Stay (HOSPITAL_COMMUNITY): Payer: 59

## 2023-10-15 DIAGNOSIS — I10 Essential (primary) hypertension: Secondary | ICD-10-CM

## 2023-10-15 DIAGNOSIS — S72141A Displaced intertrochanteric fracture of right femur, initial encounter for closed fracture: Secondary | ICD-10-CM

## 2023-10-15 DIAGNOSIS — D72823 Leukemoid reaction: Secondary | ICD-10-CM | POA: Diagnosis not present

## 2023-10-15 DIAGNOSIS — Y92009 Unspecified place in unspecified non-institutional (private) residence as the place of occurrence of the external cause: Secondary | ICD-10-CM

## 2023-10-15 DIAGNOSIS — W19XXXA Unspecified fall, initial encounter: Secondary | ICD-10-CM

## 2023-10-15 DIAGNOSIS — I5020 Unspecified systolic (congestive) heart failure: Secondary | ICD-10-CM

## 2023-10-15 DIAGNOSIS — S72001A Fracture of unspecified part of neck of right femur, initial encounter for closed fracture: Secondary | ICD-10-CM | POA: Diagnosis not present

## 2023-10-15 DIAGNOSIS — J449 Chronic obstructive pulmonary disease, unspecified: Secondary | ICD-10-CM

## 2023-10-15 DIAGNOSIS — I2510 Atherosclerotic heart disease of native coronary artery without angina pectoris: Secondary | ICD-10-CM

## 2023-10-15 DIAGNOSIS — J438 Other emphysema: Secondary | ICD-10-CM

## 2023-10-15 HISTORY — PX: INTRAMEDULLARY (IM) NAIL INTERTROCHANTERIC: SHX5875

## 2023-10-15 LAB — URINALYSIS, W/ REFLEX TO CULTURE (INFECTION SUSPECTED)
Bilirubin Urine: NEGATIVE
Glucose, UA: NEGATIVE mg/dL
Ketones, ur: NEGATIVE mg/dL
Nitrite: NEGATIVE
Protein, ur: NEGATIVE mg/dL
RBC / HPF: >50 RBC/hpf (ref 0–5)
Specific Gravity, Urine: 1.012 (ref 1.005–1.030)
WBC, UA: >50 WBC/hpf (ref 0–5)
pH: 5 (ref 5.0–8.0)

## 2023-10-15 LAB — CBC
HCT: 25.1 % — ABNORMAL LOW (ref 36.0–46.0)
Hemoglobin: 8.4 g/dL — ABNORMAL LOW (ref 12.0–15.0)
MCH: 29.4 pg (ref 26.0–34.0)
MCHC: 33.5 g/dL (ref 30.0–36.0)
MCV: 87.8 fL (ref 80.0–100.0)
Platelets: 165 10*3/uL (ref 150–400)
RBC: 2.86 MIL/uL — ABNORMAL LOW (ref 3.87–5.11)
RDW: 14.7 % (ref 11.5–15.5)
WBC: 13.2 10*3/uL — ABNORMAL HIGH (ref 4.0–10.5)
nRBC: 0 % (ref 0.0–0.2)

## 2023-10-15 LAB — BASIC METABOLIC PANEL
Anion gap: 10 (ref 5–15)
BUN: 31 mg/dL — ABNORMAL HIGH (ref 8–23)
CO2: 19 mmol/L — ABNORMAL LOW (ref 22–32)
Calcium: 8.7 mg/dL — ABNORMAL LOW (ref 8.9–10.3)
Chloride: 104 mmol/L (ref 98–111)
Creatinine, Ser: 1.36 mg/dL — ABNORMAL HIGH (ref 0.44–1.00)
GFR, Estimated: 40 mL/min — ABNORMAL LOW (ref >60)
Glucose, Bld: 146 mg/dL — ABNORMAL HIGH (ref 70–99)
Potassium: 4.2 mmol/L (ref 3.5–5.1)
Sodium: 133 mmol/L — ABNORMAL LOW (ref 135–145)

## 2023-10-15 SURGERY — FIXATION, FRACTURE, INTERTROCHANTERIC, WITH INTRAMEDULLARY ROD
Anesthesia: General | Site: Hip | Laterality: Right

## 2023-10-15 MED ORDER — TRANEXAMIC ACID-NACL 1000-0.7 MG/100ML-% IV SOLN
1000.0000 mg | INTRAVENOUS | Status: AC
  Administered 2023-10-15: 1000 mg via INTRAVENOUS

## 2023-10-15 MED ORDER — FENTANYL CITRATE (PF) 250 MCG/5ML IJ SOLN
INTRAMUSCULAR | Status: DC | PRN
  Administered 2023-10-15: 50 ug via INTRAVENOUS
  Administered 2023-10-15: 100 ug via INTRAVENOUS
  Administered 2023-10-15: 50 ug via INTRAVENOUS

## 2023-10-15 MED ORDER — CEFAZOLIN SODIUM-DEXTROSE 2-4 GM/100ML-% IV SOLN
INTRAVENOUS | Status: AC
  Filled 2023-10-15: qty 100

## 2023-10-15 MED ORDER — DEXAMETHASONE SODIUM PHOSPHATE 10 MG/ML IJ SOLN
INTRAMUSCULAR | Status: DC | PRN
  Administered 2023-10-15: 10 mg via INTRAVENOUS

## 2023-10-15 MED ORDER — ACETAMINOPHEN 500 MG PO TABS
ORAL_TABLET | ORAL | Status: AC
  Administered 2023-10-15: 1000 mg via ORAL
  Filled 2023-10-15: qty 2

## 2023-10-15 MED ORDER — CHLORHEXIDINE GLUCONATE 0.12 % MT SOLN: 15.0000 mL | Freq: Once | OROMUCOSAL | Status: AC

## 2023-10-15 MED ORDER — CHLORHEXIDINE GLUCONATE 0.12 % MT SOLN
OROMUCOSAL | Status: AC
  Administered 2023-10-15: 15 mL via OROMUCOSAL
  Filled 2023-10-15: qty 15

## 2023-10-15 MED ORDER — CHLORHEXIDINE GLUCONATE 4 % EX SOLN: 60.0000 mL | Freq: Once | CUTANEOUS | Status: DC

## 2023-10-15 MED ORDER — ROCURONIUM BROMIDE 10 MG/ML (PF) SYRINGE
PREFILLED_SYRINGE | INTRAVENOUS | Status: AC
  Filled 2023-10-15: qty 10

## 2023-10-15 MED ORDER — LIDOCAINE 2% (20 MG/ML) 5 ML SYRINGE
INTRAMUSCULAR | Status: DC | PRN
  Administered 2023-10-15: 40 mg via INTRAVENOUS

## 2023-10-15 MED ORDER — POVIDONE-IODINE 10 % EX SWAB
2.0000 | Freq: Once | CUTANEOUS | Status: AC
  Administered 2023-10-15: 2 via TOPICAL

## 2023-10-15 MED ORDER — PROPOFOL 10 MG/ML IV BOLUS
INTRAVENOUS | Status: DC | PRN
  Administered 2023-10-15: 12 mg via INTRAVENOUS

## 2023-10-15 MED ORDER — CEFAZOLIN SODIUM-DEXTROSE 2-4 GM/100ML-% IV SOLN
2.0000 g | INTRAVENOUS | Status: AC
  Administered 2023-10-15: 2 g via INTRAVENOUS

## 2023-10-15 MED ORDER — PHENYLEPHRINE HCL-NACL 20-0.9 MG/250ML-% IV SOLN
INTRAVENOUS | Status: DC | PRN
  Administered 2023-10-15: 50 ug/min via INTRAVENOUS

## 2023-10-15 MED ORDER — FENTANYL CITRATE (PF) 250 MCG/5ML IJ SOLN
INTRAMUSCULAR | Status: AC
  Filled 2023-10-15: qty 5

## 2023-10-15 MED ORDER — FENTANYL CITRATE (PF) 100 MCG/2ML IJ SOLN
25.0000 ug | INTRAMUSCULAR | Status: DC | PRN
Start: 2023-10-15 — End: 2023-10-15

## 2023-10-15 MED ORDER — PHENYLEPHRINE 80 MCG/ML (10ML) SYRINGE FOR IV PUSH (FOR BLOOD PRESSURE SUPPORT)
PREFILLED_SYRINGE | INTRAVENOUS | Status: DC | PRN
  Administered 2023-10-15 (×2): 160 ug via INTRAVENOUS
  Administered 2023-10-15 (×3): 80 ug via INTRAVENOUS
  Administered 2023-10-15: 160 ug via INTRAVENOUS

## 2023-10-15 MED ORDER — CEFAZOLIN SODIUM-DEXTROSE 1-4 GM/50ML-% IV SOLN
1.0000 g | Freq: Three times a day (TID) | INTRAVENOUS | Status: AC
  Administered 2023-10-15 – 2023-10-16 (×3): 1 g via INTRAVENOUS
  Filled 2023-10-15 (×3): qty 50

## 2023-10-15 MED ORDER — MIDAZOLAM HCL 2 MG/2ML IJ SOLN
INTRAMUSCULAR | Status: AC
  Filled 2023-10-15: qty 2

## 2023-10-15 MED ORDER — PROPOFOL 10 MG/ML IV BOLUS
INTRAVENOUS | Status: AC
Start: 2023-10-15 — End: ?
  Filled 2023-10-15: qty 20

## 2023-10-15 MED ORDER — BUPIVACAINE-EPINEPHRINE (PF) 0.25% -1:200000 IJ SOLN
INTRAMUSCULAR | Status: AC
  Filled 2023-10-15: qty 30

## 2023-10-15 MED ORDER — SODIUM CHLORIDE 0.9 % IV SOLN
1.0000 g | INTRAVENOUS | Status: DC
  Administered 2023-10-15 – 2023-10-16 (×2): 1 g via INTRAVENOUS
  Filled 2023-10-15 (×2): qty 10

## 2023-10-15 MED ORDER — LIDOCAINE 2% (20 MG/ML) 5 ML SYRINGE
INTRAMUSCULAR | Status: AC
  Filled 2023-10-15: qty 5

## 2023-10-15 MED ORDER — ONDANSETRON HCL 4 MG/2ML IJ SOLN
INTRAMUSCULAR | Status: AC
  Filled 2023-10-15: qty 2

## 2023-10-15 MED ORDER — MIDAZOLAM HCL 2 MG/2ML IJ SOLN
INTRAMUSCULAR | Status: DC | PRN
  Administered 2023-10-15: 2 mg via INTRAVENOUS

## 2023-10-15 MED ORDER — 0.9 % SODIUM CHLORIDE (POUR BTL) OPTIME
TOPICAL | Status: DC | PRN
  Administered 2023-10-15: 1000 mL

## 2023-10-15 MED ORDER — ACETAMINOPHEN 500 MG PO TABS: 1000.0000 mg | ORAL_TABLET | Freq: Once | ORAL | Status: AC

## 2023-10-15 MED ORDER — SUGAMMADEX SODIUM 200 MG/2ML IV SOLN
INTRAVENOUS | Status: DC | PRN
  Administered 2023-10-15: 200 mg via INTRAVENOUS

## 2023-10-15 MED ORDER — TRANEXAMIC ACID-NACL 1000-0.7 MG/100ML-% IV SOLN
INTRAVENOUS | Status: AC
  Filled 2023-10-15: qty 100

## 2023-10-15 MED ORDER — ORAL CARE MOUTH RINSE: 15.0000 mL | Freq: Once | OROMUCOSAL | Status: AC

## 2023-10-15 MED ORDER — LACTATED RINGERS IV SOLN: INTRAVENOUS | Status: DC

## 2023-10-15 MED ORDER — PHENYLEPHRINE 80 MCG/ML (10ML) SYRINGE FOR IV PUSH (FOR BLOOD PRESSURE SUPPORT)
PREFILLED_SYRINGE | INTRAVENOUS | Status: AC
  Filled 2023-10-15: qty 10

## 2023-10-15 MED ORDER — ROCURONIUM BROMIDE 10 MG/ML (PF) SYRINGE
PREFILLED_SYRINGE | INTRAVENOUS | Status: DC | PRN
  Administered 2023-10-15: 50 mg via INTRAVENOUS

## 2023-10-15 MED ORDER — EPHEDRINE 5 MG/ML INJ
INTRAVENOUS | Status: AC
  Filled 2023-10-15: qty 5

## 2023-10-15 MED ORDER — DEXAMETHASONE SODIUM PHOSPHATE 10 MG/ML IJ SOLN
INTRAMUSCULAR | Status: AC
  Filled 2023-10-15: qty 1

## 2023-10-15 MED ORDER — EPHEDRINE SULFATE-NACL 50-0.9 MG/10ML-% IV SOSY
PREFILLED_SYRINGE | INTRAVENOUS | Status: DC | PRN
  Administered 2023-10-15: 5 mg via INTRAVENOUS
  Administered 2023-10-15: 10 mg via INTRAVENOUS

## 2023-10-15 MED ORDER — ONDANSETRON HCL 4 MG/2ML IJ SOLN
INTRAMUSCULAR | Status: DC | PRN
  Administered 2023-10-15: 4 mg via INTRAVENOUS

## 2023-10-15 MED ORDER — BUPIVACAINE-EPINEPHRINE 0.25% -1:200000 IJ SOLN
INTRAMUSCULAR | Status: DC | PRN
  Administered 2023-10-15: 30 mL

## 2023-10-15 SURGICAL SUPPLY — 42 items
ALCOHOL 70% 16 OZ (MISCELLANEOUS) ×1 IMPLANT
BAG COUNTER SPONGE SURGICOUNT (BAG) ×1 IMPLANT
BIT DRILL INTERTAN LAG SCREW (BIT) IMPLANT
BIT DRILL LONG 4.0 (BIT) IMPLANT
BNDG COHESIVE 6X5 TAN ST LF (GAUZE/BANDAGES/DRESSINGS) ×2 IMPLANT
CANISTER SUCT 3000ML PPV (MISCELLANEOUS) ×1 IMPLANT
COVER PERINEAL POST (MISCELLANEOUS) ×1 IMPLANT
COVER SURGICAL LIGHT HANDLE (MISCELLANEOUS) ×1 IMPLANT
DRAPE C-ARM 42X72 X-RAY (DRAPES) ×1 IMPLANT
DRAPE HALF SHEET 40X57 (DRAPES) IMPLANT
DRAPE INCISE IOBAN 66X45 STRL (DRAPES) ×1 IMPLANT
DRAPE STERI IOBAN 125X83 (DRAPES) ×1 IMPLANT
DRILL BIT LONG 4.0 (BIT) ×1 IMPLANT
DRSG ADAPTIC 3X8 NADH LF (GAUZE/BANDAGES/DRESSINGS) ×1 IMPLANT
DRSG TEGADERM 4X4.75 (GAUZE/BANDAGES/DRESSINGS) IMPLANT
DRSG XEROFORM 1X8 (GAUZE/BANDAGES/DRESSINGS) IMPLANT
DURAPREP 26ML APPLICATOR (WOUND CARE) ×1 IMPLANT
ELECT CAUTERY BLADE 6.4 (BLADE) ×1 IMPLANT
ELECT REM PT RETURN 9FT ADLT (ELECTROSURGICAL) IMPLANT
ELECTRODE REM PT RTRN 9FT ADLT (ELECTROSURGICAL) ×1 IMPLANT
GAUZE SPONGE 4X4 12PLY STRL (GAUZE/BANDAGES/DRESSINGS) IMPLANT
GAUZE SPONGE 4X4 12PLY STRL LF (GAUZE/BANDAGES/DRESSINGS) ×1 IMPLANT
GLOVE BIO SURGEON STRL SZ7.5 (GLOVE) ×2 IMPLANT
GLOVE BIOGEL PI IND STRL 8 (GLOVE) ×2 IMPLANT
GOWN STRL REUS W/ TWL LRG LVL3 (GOWN DISPOSABLE) ×1 IMPLANT
GUIDE PIN 3.2X343 (PIN) ×2 IMPLANT
KIT BASIN OR (CUSTOM PROCEDURE TRAY) ×1 IMPLANT
KIT TURNOVER KIT B (KITS) ×1 IMPLANT
NAIL INTERTAN 10X18 130D 10S (Nail) IMPLANT
NS IRRIG 1000ML POUR BTL (IV SOLUTION) ×1 IMPLANT
PACK GENERAL/GYN (CUSTOM PROCEDURE TRAY) ×1 IMPLANT
PAD ARMBOARD 7.5X6 YLW CONV (MISCELLANEOUS) ×2 IMPLANT
PIN GUIDE 3.2X343MM (PIN) IMPLANT
SCREW LAG COMPR KIT 90/85 (Screw) IMPLANT
SCREW TRIGEN LOW PROF 5.0X30 (Screw) IMPLANT
STAPLER VISISTAT 35W (STAPLE) ×1 IMPLANT
SUT MON AB 2-0 CT1 36 (SUTURE) ×1 IMPLANT
SUT VIC AB 0 CT1 27XBRD ANBCTR (SUTURE) IMPLANT
SUT VIC AB 2-0 CT1 TAPERPNT 27 (SUTURE) IMPLANT
TOWEL GREEN STERILE (TOWEL DISPOSABLE) ×1 IMPLANT
TOWEL GREEN STERILE FF (TOWEL DISPOSABLE) ×1 IMPLANT
WATER STERILE IRR 1000ML POUR (IV SOLUTION) ×1 IMPLANT

## 2023-10-15 NOTE — Transfer of Care (Signed)
Immediate Anesthesia Transfer of Care Note  Patient: Michelle Zavala  Procedure(s) Performed: RIGHT INTRAMEDULLARY (IM) NAIL INTERTROCHANTERIC (Right: Hip)  Patient Location: PACU  Anesthesia Type:General  Level of Consciousness: awake, alert , and oriented  Airway & Oxygen Therapy: Patient Spontanous Breathing and Patient connected to face mask oxygen  Post-op Assessment: Report given to RN and Post -op Vital signs reviewed and stable  Post vital signs: Reviewed and stable  Last Vitals:  Vitals Value Taken Time  BP 104/56 10/15/23 0900  Temp    Pulse 95 10/15/23 0901  Resp 16 10/15/23 0901  SpO2 89 % 10/15/23 0901  Vitals shown include unfiled device data.  Last Pain:  Vitals:   10/15/23 0652  TempSrc: Oral  PainSc:       Patients Stated Pain Goal: 0 (10/15/23 0425)  Complications: No notable events documented.

## 2023-10-15 NOTE — Anesthesia Procedure Notes (Signed)
Procedure Name: Intubation Date/Time: 10/15/2023 7:44 AM  Performed by: Gloris Ham, CRNAPre-anesthesia Checklist: Patient identified, Emergency Drugs available, Suction available and Patient being monitored Patient Re-evaluated:Patient Re-evaluated prior to induction Oxygen Delivery Method: Circle System Utilized Preoxygenation: Pre-oxygenation with 100% oxygen Induction Type: IV induction Ventilation: Mask ventilation without difficulty Laryngoscope Size: Miller and 2 Grade View: Grade II Tube type: Oral Tube size: 7.0 mm Number of attempts: 1 Airway Equipment and Method: Stylet and Oral airway Placement Confirmation: ETT inserted through vocal cords under direct vision, positive ETCO2 and breath sounds checked- equal and bilateral Tube secured with: Tape Dental Injury: Teeth and Oropharynx as per pre-operative assessment

## 2023-10-15 NOTE — Progress Notes (Signed)
[facility]                                                                              Michelle Zavala, is a 78 y.o. female, DOB - 1945-12-14, ZOX:[id number] Admit date - 10/14/2023    Outpatient Primary MD for the patient is Kirstie Peri, MD  LOS - 1  days  Chief Complaint  Patient presents with   Fall       Brief summary   Patient is a 79 year old female with HTN, CAD, paroxysmal A-fib, PSVT, COPD, AAA, GERD presented after having a fall at home.  She was in the kitchen attempting to clean when she slipped on the water on the floor, resulting in the fall.  She hit the right side of her forehead, did not lose consciousness.  After the fall, patient had severe pain in her right hip and was unable to get up. Labs showed WBC 17.3, hemoglobin 9.6, creatinine 1.05 X-ray right shoulder showed no acute abnormal, severe degenerative changes of the glenohumeral joint. X-ray of the right hip showed acute comminuted displaced and impacted intertrochanteric fracture of the right proximal femur. Orthopedics was consulted.   Assessment & Plan    Principal Problem: Acute comminuted, displaced and impacted intertrochanteric fracture of the right proximal femur. -Orthopedics consulted, s/p right hip cephalomedullary nailing surgery today (Dr Hulda Humphrey) -Patient seen in the PACU, doing well, alert and oriented.  Pain controlled. -Pain control, DVT prophylaxis per orthopedic surgery -Bowel regimen, resume diet   Active Problems:   Fall at home, initial encounter -PT evaluation starting tomorrow  Leukocytosis, urinary tract infection -For now placed on IV Rocephin, follow urine culture and sensitivities  Chronic systolic CHF, HFrEF -Currently euvolemic.  2D echo 08/2021 had shown EF of 35 to 40% with G1 DD -Continue Lasix, Aldactone -Follows Dr Diona Browner outpatient   Essential hypertension -Cardiology visit note from 08/10/2023 reviewed, patient had been recommended to start  bisoprolol 5 mg daily, Aldactone 25 mg daily, furosemide 20 mg every other day, K 20 mEq every other day -Patient had been recommended by cardiology to stop Toprol-XL, chlorthalidone -Continue Lasix 20 mg every other day.  BP soft, will hold Aldactone. -Patient was recommended bisoprolol 5 mg daily, will place on bisoprolol once BP is more stable   COPD -No wheezing, continue DuoNebs as needed    Normocytic anemia Acute on chronic.  Hemoglobin 9.6 on admission, but previously had been 11.1 on 09/19/2023 -No reports of any bleeding, repeat H&H 9.6 -Anemia panel   Memory impairment -Continue Namenda 5 mg, outpatient follows neurology.  Last seen on 09/27/2023  -Continue delirium precautions   Anxiety and depression -Continue amitriptyline, Zoloft, and Ativan   Nonobstructive CAD Mixed hyperlipidemia -  Last cardiac catheterization 09/2021 demonstrated mild to moderate nonobstructive coronary disease. - Last LDL 177, but patient has statin induced myalgias.   Ascending AAA Patient has a 4.2 cm AAA seen on previous CTA of the chest in 09/2022. -Continue outpatient screening   GERD -Continue Protonix  Estimated body mass index is 25.7 kg/m as calculated from the following:   Height as of this encounter: 5' 2.99" (1.6 m).  Weight as of this encounter: 65.8 kg.  Code Status: Full code DVT Prophylaxis:  enoxaparin (LOVENOX) injection 40 mg Start: 10/14/23 2200   Level of Care: Level of care: Telemetry Surgical Family Communication: Updated patient Disposition Plan:      Remains inpatient appropriate:      Procedures:  10/15/2023 right hip cephalomedullary nailing   Consultants:   Orthopedics  Antimicrobials:   Anti-infectives (From admission, onward)    Start     Dose/Rate Route Frequency Ordered Stop   10/15/23 1800  cefTRIAXone (ROCEPHIN) 1 g in sodium chloride 0.9 % 100 mL IVPB        1 g 200 mL/hr over 30 Minutes Intravenous Every 24 hours 10/15/23 0608     10/15/23  1400  ceFAZolin (ANCEF) IVPB 1 g/50 mL premix        1 g 100 mL/hr over 30 Minutes Intravenous Every 8 hours 10/15/23 0955 10/16/23 1359   10/15/23 0652  ceFAZolin (ANCEF) 2-4 GM/100ML-% IVPB       Note to Pharmacy: Kathrene Bongo D: cabinet override      10/15/23 0652 10/15/23 0758   10/15/23 0630  ceFAZolin (ANCEF) IVPB 2g/100 mL premix        2 g 200 mL/hr over 30 Minutes Intravenous On call to O.R. 10/15/23 0543 10/15/23 0745          Medications  amitriptyline  75 mg Oral QHS   enoxaparin (LOVENOX) injection  40 mg Subcutaneous Q24H   furosemide  20 mg Oral QODAY   LORazepam  1 mg Oral BID   memantine  5 mg Oral QHS   pantoprazole  40 mg Oral QHS   sertraline  100 mg Oral QHS   spironolactone  25 mg Oral Daily      Subjective:   Michelle Zavala was seen and examined today.  Seen in PACU after surgery, alert and oriented.  No acute complaints.  Patient denies dizziness, chest pain, shortness of breath, abdominal pain, N/V/D/C, new weakness, numbess, tingling. No acute events overnight.    Objective:   Vitals:   10/15/23 0915 10/15/23 0930 10/15/23 0945 10/15/23 0959  BP: (!) 87/50 (!) 88/59 (!) 114/55 (!) 108/59  Pulse: 84 77 77 77  Resp: 17 14 14    Temp:   97.7 F (36.5 C) 98 F (36.7 C)  TempSrc:    Oral  SpO2: 92% 97% 96% 99%  Weight:      Height:        Intake/Output Summary (Last 24 hours) at 10/15/2023 1028 Last data filed at 10/15/2023 0901 Gross per 24 hour  Intake 1210 ml  Output 60 ml  Net 1150 ml     Wt Readings from Last 3 Encounters:  10/15/23 65.8 kg  09/27/23 65.8 kg  09/19/23 66.7 kg     Exam General: Alert and oriented x 3, NAD Cardiovascular: S1 S2 auscultated,  RRR Respiratory: Clear to auscultation bilaterally, no wheezing Gastrointestinal: Soft, nontender, nondistended, + bowel sounds Ext: no pedal edema bilaterally Neuro: no new deficits Psych: Normal affect     Data Reviewed:  I have personally reviewed following labs     CBC Lab Results  Component Value Date   WBC 17.3 (H) 10/14/2023   RBC 3.28 (L) 10/14/2023   HGB 9.6 (L) 10/14/2023   HCT 28.5 (L) 10/14/2023   MCV 89.0 10/14/2023   MCH 29.3 10/14/2023   PLT 197 10/14/2023   MCHC 32.9 10/14/2023   RDW 14.6 10/14/2023   LYMPHSABS  0.9 10/14/2023   MONOABS 1.0 10/14/2023   EOSABS 0.0 10/14/2023   BASOSABS 0.1 10/14/2023     Last metabolic panel Lab Results  Component Value Date   NA 136 10/14/2023   K 4.9 10/14/2023   CL 107 10/14/2023   CO2 18 (L) 10/14/2023   BUN 27 (H) 10/14/2023   CREATININE 1.05 (H) 10/14/2023   GLUCOSE 125 (H) 10/14/2023   GFRNONAA 54 (L) 10/14/2023   GFRAA 57 (L) 07/30/2020   CALCIUM 9.5 10/14/2023   PROT 6.8 10/18/2022   ALBUMIN 4.0 10/18/2022   BILITOT 0.4 10/18/2022   ALKPHOS 56 10/18/2022   AST 24 10/18/2022   ALT 15 10/18/2022   ANIONGAP 11 10/14/2023    CBG (last 3)  No results for input(s): "GLUCAP" in the last 72 hours.    Coagulation Profile: No results for input(s): "INR", "PROTIME" in the last 168 hours.   Radiology Studies: I have personally reviewed the imaging studies  DG C-Arm 1-60 Min-No Report Result Date: 10/15/2023 Fluoroscopy was utilized by the requesting physician.  No radiographic interpretation.   DG Pelvis 1-2 Views Result Date: 10/14/2023 CLINICAL DATA:  Hip fracture.  View in traction EXAM: Right hip 2 VIEW COMPARISON:  Pelvis x-ray 10/14/2023 FINDINGS: Comminuted angulated fracture of the intertrochanteric region of the right hip. Level of foreshortening/overriding and angulation is slightly improved compared to previous x-ray. Underlying osteopenia. Preserved joint spaces. IMPRESSION: Slight improvement in alignment with significant residual angulation and foreshortening of the intertrochanteric hip fracture. Electronically Signed   By: Karen Kays M.D.   On: 10/14/2023 16:49   CT Head Wo Contrast Result Date: 10/14/2023 CLINICAL DATA:  Fall, with small hematoma and  laceration to right forehead EXAM: CT HEAD WITHOUT CONTRAST CT CERVICAL SPINE WITHOUT CONTRAST TECHNIQUE: Multidetector CT imaging of the head and cervical spine was performed following the standard protocol without intravenous contrast. Multiplanar CT image reconstructions of the cervical spine were also generated. RADIATION DOSE REDUCTION: This exam was performed according to the departmental dose-optimization program which includes automated exposure control, adjustment of the mA and/or kV according to patient size and/or use of iterative reconstruction technique. COMPARISON:  10/18/2022 CT head and cervical spine FINDINGS: CT HEAD FINDINGS Brain: No evidence of acute infarct, hemorrhage, mass, mass effect, or midline shift. No hydrocephalus or extra-axial fluid collection. Periventricular white matter changes, likely the sequela of chronic small vessel ischemic disease. Vascular: No hyperdense vessel. Atherosclerotic calcifications in the intracranial carotid and vertebral arteries. Skull: Negative for fracture or focal lesion. Small bilateral forehead hematomas with right forehead laceration. Sinuses/Orbits: Mild mucosal thickening in the ethmoid air cells. No acute finding in the orbits. Other: The mastoid air cells are well aerated. CT CERVICAL SPINE FINDINGS Alignment: No traumatic listhesis. Skull base and vertebrae: No acute fracture or suspicious osseous lesion. ACDF C5-C7. Additional osseous fusion of C3-C5. C6-C7 spinous process cerclage. Soft tissues and spinal canal: No prevertebral fluid or swelling. No visible canal hematoma. Disc levels: Degenerative changes in the cervical spine.No high-grade spinal canal stenosis. Upper chest: No focal pulmonary opacity or pleural effusion. IMPRESSION: 1. No acute intracranial process. 2. No acute fracture or traumatic listhesis in the cervical spine. 3. Small bilateral forehead hematomas with right forehead laceration. Electronically Signed   By: Wiliam Ke  M.D.   On: 10/14/2023 13:23   CT Cervical Spine Wo Contrast Result Date: 10/14/2023 CLINICAL DATA:  Fall, with small hematoma and laceration to right forehead EXAM: CT HEAD WITHOUT CONTRAST CT CERVICAL SPINE WITHOUT  CONTRAST TECHNIQUE: Multidetector CT imaging of the head and cervical spine was performed following the standard protocol without intravenous contrast. Multiplanar CT image reconstructions of the cervical spine were also generated. RADIATION DOSE REDUCTION: This exam was performed according to the departmental dose-optimization program which includes automated exposure control, adjustment of the mA and/or kV according to patient size and/or use of iterative reconstruction technique. COMPARISON:  10/18/2022 CT head and cervical spine FINDINGS: CT HEAD FINDINGS Brain: No evidence of acute infarct, hemorrhage, mass, mass effect, or midline shift. No hydrocephalus or extra-axial fluid collection. Periventricular white matter changes, likely the sequela of chronic small vessel ischemic disease. Vascular: No hyperdense vessel. Atherosclerotic calcifications in the intracranial carotid and vertebral arteries. Skull: Negative for fracture or focal lesion. Small bilateral forehead hematomas with right forehead laceration. Sinuses/Orbits: Mild mucosal thickening in the ethmoid air cells. No acute finding in the orbits. Other: The mastoid air cells are well aerated. CT CERVICAL SPINE FINDINGS Alignment: No traumatic listhesis. Skull base and vertebrae: No acute fracture or suspicious osseous lesion. ACDF C5-C7. Additional osseous fusion of C3-C5. C6-C7 spinous process cerclage. Soft tissues and spinal canal: No prevertebral fluid or swelling. No visible canal hematoma. Disc levels: Degenerative changes in the cervical spine.No high-grade spinal canal stenosis. Upper chest: No focal pulmonary opacity or pleural effusion. IMPRESSION: 1. No acute intracranial process. 2. No acute fracture or traumatic listhesis in  the cervical spine. 3. Small bilateral forehead hematomas with right forehead laceration. Electronically Signed   By: Wiliam Ke M.D.   On: 10/14/2023 13:23   DG Chest Port 1 View Result Date: 10/14/2023 CLINICAL DATA:  Fall. EXAM: PORTABLE CHEST 1 VIEW COMPARISON:  Chest radiograph dated 09/19/2023. FINDINGS: The heart size and mediastinal contours are within normal limits. Aortic atherosclerosis. No focal consolidation, sizeable pleural effusion, or pneumothorax. Severe degenerative changes of the right glenohumeral joint. Partially visualized postoperative changes of the cervical spine. No acute osseous abnormality identified. IMPRESSION: No acute findings in the chest. Electronically Signed   By: Hart Robinsons M.D.   On: 10/14/2023 12:26   DG Shoulder Right Port Result Date: 10/14/2023 CLINICAL DATA:  Shoulder pain status post fall. EXAM: RIGHT SHOULDER - 1 VIEW COMPARISON:  10/10/2023. FINDINGS: There is no evidence of acute fracture or dislocation. Severe degenerative changes of the right glenohumeral joint are again noted with severe joint space narrowing, marginal osteophytosis, and subchondral sclerosis. Similar high-riding humeral head, likely relates to chronic rotator cuff pathology. Moderate degenerative changes of the acromioclavicular joint. Postoperative changes of the cervical spine. IMPRESSION: 1. No acute osseous abnormality. 2. Severe degenerative changes of the glenohumeral joint. 3. Similar high-riding humeral head, likely relates to chronic rotator cuff pathology. 4. Moderate degenerative changes of the acromioclavicular joint. Electronically Signed   By: Hart Robinsons M.D.   On: 10/14/2023 12:22   DG Hip Port [place] Pelvis 1 View Right Result Date: 10/14/2023 CLINICAL DATA:  Hip pain status post fall. EXAM: DG HIP (WITH OR WITHOUT PELVIS) 1V PORT RIGHT COMPARISON:  09/19/2023. FINDINGS: Diffuse osseous demineralization. There is an acute comminuted and impacted  intertrochanteric fracture of the right proximal femur. There is approximately 5.5 cm of superior displacement of the distal fracture component with apex varus angulation. The right femoral head is seated within the acetabulum. Status post ORIF of the left proximal femur. The sacroiliac joints and pubic symphysis are anatomically aligned. Degenerative changes of the visualized lower lumbar spine. IMPRESSION: Acute comminuted, displaced and impacted intertrochanteric fracture of the  right proximal femur. Electronically Signed   By: Hart Robinsons M.D.   On: 10/14/2023 12:18       Isobella Ascher M.D. [facility] 10/15/2023, 10:28 AM  Available via Epic secure chat 7am-7pm After 7 pm, please refer to night coverage provider listed on amion.

## 2023-10-15 NOTE — Progress Notes (Signed)
PT Cancellation Note  Patient Details Name: Michelle Zavala MRN: [id number] DOB: November 03, 1945   Cancelled Treatment:    Reason Eval/Treat Not Completed: Patient declined, no reason specified.  Let's wait until tomorrow. 10/15/2023  Jacinto Halim., PT [facility] [phone]  (office)   Eliseo Gum Lamara Brecht 10/15/2023, 5:08 PM

## 2023-10-15 NOTE — Op Note (Signed)
.  Orthopaedic Surgery Operative Note (CSN: [id number])  Michelle Zavala  09-29-1945 Date of Surgery: 10/15/2023   Diagnoses:  Right Hip intertrochanteric fracture  Procedure: Right hip cephalomedullary nailing   Operative Finl ding Successful completion of the planned procedure.  Right hip intertrochanteric/basicervical hip fracture  Post-operative plan: The patient will be bearing as tolerated.  DVT prophylaxis Lovenox 40 mg/day until mobilizing and then consider transition in clinic to alternative medicines.  Ancef 1 g every 8 hours for 3 doses for surgical prophylaxis. Pain control with PRN pain medication preferring oral medicines.  Follow up plan will be scheduled in approximately 14 days for incision check and XR.  Post-Op Diagnosis: Same Surgeons:Primary: Luci Bank, MD Assistants: None Location: [facility] OR ROOM 05 Anesthesia: General with local anesthesia Antibiotics: Ancef 2 g Tourniquet time: N/A Estimated Blood Loss: 200 cc Complications: None Specimens: None Implants: Implant Name Type Inv. Item Serial No. Manufacturer Lot No. LRB No. Used Action  NAIL INTERTAN 10X18 130D 10S - [id number] Nail NAIL INTERTAN 10X18 130D 10S  [employer] [id number] Right 1 Implanted  SCREW LAG COMPR KIT 90/85 - [id number] Screw SCREW LAG COMPR KIT 90/85  [employer] [id number] Right 1 Implanted  SCREW TRIGEN LOW PROF 5.0X30 - [id number] Screw SCREW TRIGEN LOW PROF 5.0X30  [employer] [id number] Right 1 Implanted    Indications for Surgery:   Michelle Zavala is a 78 y.o. female with right hip intertrochanteric/basicervical fracture after a mechanical fall yesterday.  She was indicated for a right hip cephalomedullary nail  Risks and benefit of surgery discussed with patient and family all questions answered. These risks include but are not limited to infection, bleeding, damage to neurovascular structures, failure to heal, nonunion, malunion, failure of  hardware, prominent hardware, need for additional surgery, DVT and cardiopulmonary complications from anesthesia. The patient understands these risks and wished to proceed with surgery    Procedure:   The patient was identified properly. Informed consent was obtained and the surgical site was marked. The patient was taken up to suite where general anesthesia was induced.The patient was placed supine on a fracture table and appropriate reduction was obtained and visualized on fluoroscopy prior to the beginning of the procedure.  We made an incision proximal to the greater trochanter and dissected down through the fascia.  We then carefully placed our starting guidewire localizing under fluoroscopy prior to advancing the wire into the bone to appropriate depth. Entry reamer was used to open the canal and we selected a length of nail noted above.  At this point we placed our nail localizing under fluoroscopy that it was at the appropriate level prior to using the outrigger device to pass a wire and then the cephalo-medullary screws.  The screw was locked proximally to avoid over collapse.  We took final shots at the proximal femur and then used the outrigger to place one distal interlock screw.  Final pictures were obtained.  The wounds were thoroughly irrigated closed in a multilayer fashion with absorable sutures and skin staples.  Incisions were infiltrated with 0.25% Marcaine. A sterile dressing was placed.  The patient was awoken from general anesthesia and taken to the PACU in stable condition without complication.

## 2023-10-15 NOTE — Anesthesia Postprocedure Evaluation (Signed)
Anesthesia Post Note  Patient: Michelle Zavala  Procedure(s) Performed: RIGHT INTRAMEDULLARY (IM) NAIL INTERTROCHANTERIC (Right: Hip)     Patient location during evaluation: PACU Anesthesia Type: General Level of consciousness: awake and alert Pain management: pain level controlled Vital Signs Assessment: post-procedure vital signs reviewed and stable Respiratory status: spontaneous breathing, nonlabored ventilation, respiratory function stable and patient connected to nasal cannula oxygen Cardiovascular status: blood pressure returned to baseline and stable Postop Assessment: no apparent nausea or vomiting Anesthetic complications: no   There were no known notable events for this encounter.  Last Vitals:  Vitals:   10/15/23 0959 10/15/23 1436  BP: (!) 108/59 117/82  Pulse: 77 92  Resp:    Temp: 36.7 C 36.6 C  SpO2: 99% 100%    Last Pain:  Vitals:   10/15/23 1436  TempSrc: Oral  PainSc:                  Nelle Don Klyn Kroening

## 2023-10-15 NOTE — Progress Notes (Addendum)
78 year old female with a right intertrochanteric vs basicervical hip fracture.  Plan for right hip cephalomedullary nail  Risks and benefit of surgery discussed with patient and family all questions answered.  These risks include but are not limited to infection, bleeding, damage to neurovascular structures, failure to heal, nonunion, malunion, failure of hardware, prominent hardware, need for additional surgery, DVT and cardiopulmonary complications from anesthesia.  The patient understands these risks and wished to proceed with surgery

## 2023-10-16 DIAGNOSIS — W19XXXA Unspecified fall, initial encounter: Secondary | ICD-10-CM | POA: Diagnosis not present

## 2023-10-16 DIAGNOSIS — D649 Anemia, unspecified: Secondary | ICD-10-CM

## 2023-10-16 DIAGNOSIS — K2100 Gastro-esophageal reflux disease with esophagitis, without bleeding: Secondary | ICD-10-CM | POA: Diagnosis not present

## 2023-10-16 DIAGNOSIS — D72823 Leukemoid reaction: Secondary | ICD-10-CM | POA: Diagnosis not present

## 2023-10-16 DIAGNOSIS — S72001A Fracture of unspecified part of neck of right femur, initial encounter for closed fracture: Secondary | ICD-10-CM | POA: Diagnosis not present

## 2023-10-16 LAB — CBC
HCT: 20.5 % — ABNORMAL LOW (ref 36.0–46.0)
Hemoglobin: 6.9 g/dL — CL (ref 12.0–15.0)
MCH: 29.7 pg (ref 26.0–34.0)
MCHC: 33.7 g/dL (ref 30.0–36.0)
MCV: 88.4 fL (ref 80.0–100.0)
Platelets: 157 10*3/uL (ref 150–400)
RBC: 2.32 MIL/uL — ABNORMAL LOW (ref 3.87–5.11)
RDW: 14.9 % (ref 11.5–15.5)
WBC: 16.1 10*3/uL — ABNORMAL HIGH (ref 4.0–10.5)
nRBC: 0 % (ref 0.0–0.2)

## 2023-10-16 LAB — IRON AND TIBC
Iron: 33 ug/dL (ref 28–170)
Saturation Ratios: 11 % (ref 10.4–31.8)
TIBC: 305 ug/dL (ref 250–450)
UIBC: 272 ug/dL

## 2023-10-16 LAB — BASIC METABOLIC PANEL
Anion gap: 12 (ref 5–15)
BUN: 39 mg/dL — ABNORMAL HIGH (ref 8–23)
CO2: 17 mmol/L — ABNORMAL LOW (ref 22–32)
Calcium: 8.6 mg/dL — ABNORMAL LOW (ref 8.9–10.3)
Chloride: 105 mmol/L (ref 98–111)
Creatinine, Ser: 1.68 mg/dL — ABNORMAL HIGH (ref 0.44–1.00)
GFR, Estimated: 31 mL/min — ABNORMAL LOW (ref >60)
Glucose, Bld: 145 mg/dL — ABNORMAL HIGH (ref 70–99)
Potassium: 5 mmol/L (ref 3.5–5.1)
Sodium: 134 mmol/L — ABNORMAL LOW (ref 135–145)

## 2023-10-16 LAB — FOLATE: Folate: 4.4 ng/mL — ABNORMAL LOW (ref >5.9)

## 2023-10-16 LAB — RETICULOCYTES
Immature Retic Fract: 16.7 % — ABNORMAL HIGH (ref 2.3–15.9)
RBC.: 2.32 MIL/uL — ABNORMAL LOW (ref 3.87–5.11)
Retic Count, Absolute: 46.4 10*3/uL (ref 19.0–186.0)
Retic Ct Pct: 2 % (ref 0.4–3.1)

## 2023-10-16 LAB — VITAMIN B12: Vitamin B-12: 561 pg/mL (ref 180–914)

## 2023-10-16 LAB — FERRITIN: Ferritin: 304 ng/mL (ref 11–307)

## 2023-10-16 LAB — PREPARE RBC (CROSSMATCH)

## 2023-10-16 MED ORDER — SODIUM CHLORIDE 0.9% IV SOLUTION: Freq: Once | INTRAVENOUS | Status: AC

## 2023-10-16 MED ORDER — FOLIC ACID 1 MG PO TABS
1.0000 mg | ORAL_TABLET | Freq: Every day | ORAL | Status: DC
Start: 2023-10-16 — End: 2023-10-20
  Administered 2023-10-16 – 2023-10-20 (×5): 1 mg via ORAL
  Filled 2023-10-16 (×5): qty 1

## 2023-10-16 NOTE — Progress Notes (Signed)
.  Subjective: 1 Day Post-Op Procedure(s) (LRB): RIGHT INTRAMEDULLARY (IM) NAIL INTERTROCHANTERIC (Right)  No events overnight. Pain improved. Unable to work with PT due to dizziness/pain  Activity level:  WBAT RLE Diet tolerance:  as tolerated Patient reports pain as moderate.    Objective: Vital signs in last 24 hours: Temp:  [97.8 F (36.6 C)-98.7 F (37.1 C)] 98.7 F (37.1 C) (02/02 0800) Pulse Rate:  [92-109] 105 (02/02 0800) Resp:  [17] 17 (02/02 0800) BP: (85-117)/(60-82) 102/62 (02/02 0800) SpO2:  [98 %-100 %] 98 % (02/02 0800)  Labs: Recent Labs    10/14/23 1114 10/14/23 1841 10/15/23 1013 10/16/23 0914  HGB 9.6* 9.6* 8.4* 6.9*   Recent Labs    10/15/23 1013 10/16/23 0914  WBC 13.2* 16.1*  RBC 2.86* 2.32*  2.32*  HCT 25.1* 20.5*  PLT 165 157   Recent Labs    10/15/23 1013 10/16/23 0614  NA 133* 134*  K 4.2 5.0  CL 104 105  CO2 19* 17*  BUN 31* 39*  CREATININE 1.36* 1.68*  GLUCOSE 146* 145*  CALCIUM 8.7* 8.6*   No results for input(s): "LABPT", "INR" in the last 72 hours.  Physical Exam:  Neurologically intact ABD soft RLE Neurovascular intact Sensation intact distally Intact pulses distally Dorsiflexion/Plantar flexion intact Incision: dressing C/D/I  Assessment/Plan:  1 Day Post-Op Procedure(s) (LRB): RIGHT INTRAMEDULLARY (IM) NAIL INTERTROCHANTERIC (Right)  WBAT RLE PT/OT Hgb 6.9. Likely due to acute blood loss anemia from fracture and surgery. Continue to trend CBC and transfuse as needed Ancef 1 g every 8 hours for 3 doses for surgical prophylaxis.  Pain control with PRN pain medication preferring oral medicines  DVT ppx-Lovenox 40mg  daily   Luci Bank 10/16/2023, 10:25 AM

## 2023-10-16 NOTE — Evaluation (Signed)
Physical Therapy Evaluation Patient Details Name: Michelle Zavala MRN: [id number] DOB: 01/01/1946 Today's Date: 10/16/2023  History of Present Illness  Pt is a 78 y.o. female presenting 1/31 after fall at home. Found to have acute communicated displaced intertrochanteric fx of thr R femur. Now s/p R intertrochanteric intramedullary nailing 2/1, WBAT. PMH: HTN, CAD, PAD, PSVT, AAA, COPD, GERD.  Clinical Impression   PTA, pt lived alone and reports she was mod I for Mobility, ADL and IADL. Pt reports neighbors can stop by to assist her as needed. Upon eval, pt limited predominantly by dizziness with positional changes and drop in BP by 20+ points. Of note, Hgb was 6.9 this morning. RN cleared to see and made aware of orthostatic vital signs. Pt with good initiation of mobility this session needing min HHA to elevate trunk and come to EOB. Suspect pt will progress well; able to sit EOB for two 3-5 minute bouts positive for dizziness both attempts. Will continue to follow. Recommend inpatient rehab <3 hours/day.         If plan is discharge home, recommend the following: A lot of help with walking and/or transfers;A lot of help with bathing/dressing/bathroom;Help with stairs or ramp for entrance;Assistance with cooking/housework   Can travel by private vehicle   No    Equipment Recommendations Rolling walker (2 wheels);BSC/3in1  Recommendations for Other Services       Functional Status Assessment Patient has had a recent decline in their functional status and demonstrates the ability to make significant improvements in function in a reasonable and predictable amount of time.     Precautions / Restrictions Precautions Precautions: Fall Restrictions Weight Bearing Restrictions Per Provider Order: Yes RLE Weight Bearing Per Provider Order: Weight bearing as tolerated      Mobility  Bed Mobility Overal bed mobility: Needs Assistance Bed Mobility: Supine to Sit, Sit to Supine     Supine to  sit: Min assist Sit to supine: Contact guard assist   General bed mobility comments: min A hand held assist for truncal elevation    Transfers                   General transfer comment: deferred secondary to orthostatic BPs and positive symptoms    Ambulation/Gait                  Stairs            Wheelchair Mobility     Tilt Bed    Modified Rankin (Stroke Patients Only)       Balance Overall balance assessment: Needs assistance Sitting-balance support: No upper extremity supported, Feet supported, Bilateral upper extremity supported   Sitting balance - Comments: statically pt's balance is fair+; up to mod A with onset dizziness at EOB with pt initially initiating return to supine and once redirected for BP to be taken in sitting, OT behind pt and intermittently leaning back onto OT at mod A                                     Pertinent Vitals/Pain Pain Assessment Pain Assessment: Faces Faces Pain Scale: Hurts a little bit Pain Location: R hip Pain Descriptors / Indicators: Discomfort, Sore Pain Intervention(s): Monitored during session    Home Living Family/patient expects to be discharged to:: Private residence Living Arrangements: Alone Available Help at Discharge: Neighbor Type of Home: Apartment Home Access: Level entry  Home Layout: One level Home Equipment: Rolling Walker (2 wheels);Grab bars - tub/shower;Shower seat      Prior Function Prior Level of Function : Independent/Modified Independent;Driving             Mobility Comments: no AD ADLs Comments: independent     Extremity/Trunk Assessment   Upper Extremity Assessment Upper Extremity Assessment: Defer to OT evaluation    Lower Extremity Assessment Lower Extremity Assessment: Generalized weakness;RLE deficits/detail RLE Deficits / Details: Grossly decr AROM and strength post fx and surgical repair; Overall moving well with some diffuclty  noted with weight bearing during bridging    Cervical / Trunk Assessment Cervical / Trunk Assessment:  (forward rounding of shoulders)  Communication   Communication Communication: Hearing impairment Cueing Techniques: Verbal cues;Tactile cues  Cognition Arousal: Alert Behavior During Therapy: WFL for tasks assessed/performed Overall Cognitive Status: Within Functional Limits for tasks assessed                                 General Comments: hard of hearing; follows basic commands but cognition not formally assessed. WFL for basic mobility assessed.        General Comments General comments (skin integrity, edema, etc.):   10/16/23 1500  Vital Signs  Patient Position (if appropriate) Orthostatic Vitals  Orthostatic Lying   BP- Lying 111/71  Pulse- Lying 107  Orthostatic Sitting  BP- Sitting (!) 87/72  Pulse- Sitting 114 (and reports of dizziness)       Exercises     Assessment/Plan    PT Assessment Patient needs continued PT services  PT Problem List Decreased strength;Decreased range of motion;Decreased activity tolerance;Decreased balance;Decreased mobility;Decreased coordination;Decreased knowledge of use of DME;Decreased cognition;Decreased safety awareness;Decreased knowledge of precautions;Cardiopulmonary status limiting activity       PT Treatment Interventions DME instruction;Gait training;Stair training;Functional mobility training;Therapeutic activities;Therapeutic exercise;Balance training;Cognitive remediation;Patient/family education;Wheelchair mobility training    PT Goals (Current goals can be found in the Care Plan section)  Acute Rehab PT Goals Patient Stated Goal: be able to get home PT Goal Formulation: With patient Time For Goal Achievement: 10/30/23 Potential to Achieve Goals: Good    Frequency Min 1X/week     Co-evaluation               AM-PAC PT "6 Clicks" Mobility  Outcome Measure Help needed turning from your  back to your side while in a flat bed without using bedrails?: A Little Help needed moving from lying on your back to sitting on the side of a flat bed without using bedrails?: A Little Help needed moving to and from a bed to a chair (including a wheelchair)?: A Lot Help needed standing up from a chair using your arms (e.g., wheelchair or bedside chair)?: Total Help needed to walk in hospital room?: Total Help needed climbing 3-5 steps with a railing? : Total 6 Click Score: 11    End of Session   Activity Tolerance: Patient tolerated treatment well (though BP drop in sitting with dizziness) Patient left: in bed;with call bell/phone within reach;Other (comment) (bed in semi-chair position) Nurse Communication: Mobility status PT Visit Diagnosis: Unsteadiness on feet (R26.81);Other abnormalities of gait and mobility (R26.89)    Time: 7846-9629 PT Time Calculation (min) (ACUTE ONLY): 26 min   Charges:   PT Evaluation $PT Eval Moderate Complexity: 1 Mod   PT General Charges $$ ACUTE PT VISIT: 1 Visit  Van Clines, PT  Acute Rehabilitation Services Office [phone] Secure Chat welcomed   Levi Aland 10/16/2023, 3:05 PM

## 2023-10-16 NOTE — Progress Notes (Signed)
[facility]                                                                              Michelle Zavala, is a 78 y.o. female, DOB - August 05, 1946, ZOX:[id number] Admit date - 10/14/2023    Outpatient Primary MD for the patient is Kirstie Peri, MD  LOS - 2  days  Chief Complaint  Patient presents with   Fall       Brief summary   Patient is a 78 year old female with HTN, CAD, paroxysmal A-fib, PSVT, COPD, AAA, GERD presented after having a fall at home.  She was in the kitchen attempting to clean when she slipped on the water on the floor, resulting in the fall.  She hit the right side of her forehead, did not lose consciousness.  After the fall, patient had severe pain in her right hip and was unable to get up. Labs showed WBC 17.3, hemoglobin 9.6, creatinine 1.05 X-ray right shoulder showed no acute abnormal, severe degenerative changes of the glenohumeral joint. X-ray of the right hip showed acute comminuted displaced and impacted intertrochanteric fracture of the right proximal femur. Orthopedics was consulted.   Assessment & Plan    Principal Problem: Acute comminuted, displaced and impacted intertrochanteric fracture of the right proximal femur. -Orthopedics consulted, s/p right hip cephalomedullary nailing surgery, on 10/15/2023 (Dr Hulda Humphrey) -Hemoglobin down to 6.9 today, will transfuse 2 units packed RBCs -Pain control, DVT prophylaxis per orthopedics -PT OT eval   Active Problems: Acute blood loss anemia, postoperative on chronic normocytic anemia Folic acid deficiency Acute on chronic.  Hemoglobin 9.6 on admission, but previously had been 11.1 on 09/19/2023 -Hemoglobin 6.9 today, folate 4.4 -Started on folic acid 1 mg daily -Transfuse 2 units packed RBCs today    Fall at home, initial encounter -PT evaluation  Leukocytosis, urinary tract infection -Continue IV Rocephin, follow urine culture and sensitivities  Chronic systolic CHF, HFrEF -Currently  euvolemic.  2D echo 08/2021 had shown EF of 35 to 40% with G1 DD -Currently Lasix, Aldactone on hold -Follows Dr Diona Browner outpatient  Acute kidney injury -Baseline creatinine 0.7-0.9 -Presented with creatinine of 1.0, trended up to 1.6 today -Hold Lasix, Aldactone -Hemoglobin 6.9, transfuse 2 units packed RBCs   Essential hypertension -Cardiology visit note from 08/10/2023 reviewed, patient had been recommended to start bisoprolol 5 mg daily, Aldactone 25 mg daily, furosemide 20 mg every other day, K 20 mEq every other day -Patient had been recommended by cardiology to stop Toprol-XL, chlorthalidone -Hold Lasix, Aldactone -Patient was recommended bisoprolol 5 mg daily, will place on bisoprolol once BP is more stable, currently BP soft.   COPD -No wheezing, continue DuoNebs as needed   Memory impairment -Continue Namenda 5 mg, outpatient follows neurology.  Last seen on 09/27/2023  -Continue delirium precautions.  Currently alert and oriented   Anxiety and depression -Continue amitriptyline, Zoloft, and Ativan   Nonobstructive CAD Mixed hyperlipidemia -  Last cardiac catheterization 09/2021 demonstrated mild to moderate nonobstructive coronary disease. - Last LDL 177, but patient has statin induced myalgias.   Ascending AAA Patient has a 4.2 cm AAA seen on previous CTA of the  chest in 09/2022. -Continue outpatient screening   GERD -Continue Protonix  Estimated body mass index is 25.7 kg/m as calculated from the following:   Height as of this encounter: 5' 2.99" (1.6 m).   Weight as of this encounter: 65.8 kg.  Code Status: Full code DVT Prophylaxis:  enoxaparin (LOVENOX) injection 40 mg Start: 10/14/23 2200   Level of Care: Level of care: Telemetry Surgical Family Communication: Updated patient Disposition Plan:      Remains inpatient appropriate:      Procedures:  10/15/2023 right hip cephalomedullary nailing   Consultants:   Orthopedics  Antimicrobials:    Anti-infectives (From admission, onward)    Start     Dose/Rate Route Frequency Ordered Stop   10/15/23 1800  cefTRIAXone (ROCEPHIN) 1 g in sodium chloride 0.9 % 100 mL IVPB        1 g 200 mL/hr over 30 Minutes Intravenous Every 24 hours 10/15/23 0608     10/15/23 1400  ceFAZolin (ANCEF) IVPB 1 g/50 mL premix        1 g 100 mL/hr over 30 Minutes Intravenous Every 8 hours 10/15/23 0955 10/16/23 0508   10/15/23 0652  ceFAZolin (ANCEF) 2-4 GM/100ML-% IVPB       Note to Pharmacy: Kathrene Bongo D: cabinet override      10/15/23 0652 10/15/23 0758   10/15/23 0630  ceFAZolin (ANCEF) IVPB 2g/100 mL premix        2 g 200 mL/hr over 30 Minutes Intravenous On call to O.R. 10/15/23 0543 10/15/23 0745          Medications  sodium chloride   Intravenous Once   amitriptyline  75 mg Oral QHS   enoxaparin (LOVENOX) injection  40 mg Subcutaneous Q24H   folic acid  1 mg Oral Daily   LORazepam  1 mg Oral BID   memantine  5 mg Oral QHS   pantoprazole  40 mg Oral QHS   sertraline  100 mg Oral QHS      Subjective:   Michelle Zavala was seen and examined today.  No acute complaints today.  No chest pain, shortness of breath, fevers, abdominal pain.  Hemoglobin 6.9  Objective:   Vitals:   10/16/23 0443 10/16/23 0600 10/16/23 0626 10/16/23 0800  BP: (!) 85/60 97/74 106/65 102/62  Pulse: (!) 109 (!) 109 (!) 108 (!) 105  Resp:    17  Temp: 98.1 F (36.7 C) 98 F (36.7 C) 98.4 F (36.9 C) 98.7 F (37.1 C)  TempSrc: Oral   Oral  SpO2: 98% 100% 100% 98%  Weight:      Height:        Intake/Output Summary (Last 24 hours) at 10/16/2023 1103 Last data filed at 10/16/2023 0100 Gross per 24 hour  Intake 246.21 ml  Output 400 ml  Net -153.79 ml     Wt Readings from Last 3 Encounters:  10/15/23 65.8 kg  09/27/23 65.8 kg  09/19/23 66.7 kg    Physical Exam General: Alert and oriented x 3, NAD Cardiovascular: S1 S2 clear, RRR.  Respiratory: CTAB, no wheezing Gastrointestinal: Soft,  nontender, nondistended, NBS Ext: no pedal edema bilaterally Neuro: no new deficits Psych: Normal affect     Data Reviewed:  I have personally reviewed following labs    CBC Lab Results  Component Value Date   WBC 16.1 (H) 10/16/2023   RBC 2.32 (L) 10/16/2023   RBC 2.32 (L) 10/16/2023   HGB 6.9 (LL) 10/16/2023   HCT 20.5 (L)  10/16/2023   MCV 88.4 10/16/2023   MCH 29.7 10/16/2023   PLT 157 10/16/2023   MCHC 33.7 10/16/2023   RDW 14.9 10/16/2023   LYMPHSABS 0.9 10/14/2023   MONOABS 1.0 10/14/2023   EOSABS 0.0 10/14/2023   BASOSABS 0.1 10/14/2023     Last metabolic panel Lab Results  Component Value Date   NA 134 (L) 10/16/2023   K 5.0 10/16/2023   CL 105 10/16/2023   CO2 17 (L) 10/16/2023   BUN 39 (H) 10/16/2023   CREATININE 1.68 (H) 10/16/2023   GLUCOSE 145 (H) 10/16/2023   GFRNONAA 31 (L) 10/16/2023   GFRAA 57 (L) 07/30/2020   CALCIUM 8.6 (L) 10/16/2023   PROT 6.8 10/18/2022   ALBUMIN 4.0 10/18/2022   BILITOT 0.4 10/18/2022   ALKPHOS 56 10/18/2022   AST 24 10/18/2022   ALT 15 10/18/2022   ANIONGAP 12 10/16/2023    CBG (last 3)  No results for input(s): "GLUCAP" in the last 72 hours.    Coagulation Profile: No results for input(s): "INR", "PROTIME" in the last 168 hours.   Radiology Studies: I have personally reviewed the imaging studies  DG HIP UNILAT WITH PELVIS 2-3 VIEWS RIGHT Result Date: 10/15/2023 CLINICAL DATA:  Right femoral nail placement. EXAM: DG HIP (WITH OR WITHOUT PELVIS) 2-3V RIGHT COMPARISON:  10/14/2023. FINDINGS: 3 intraoperative fluoroscopic spot views of the right hip show placement of a short intramedullary nail in the proximal right femur with 1 distal interlocking screw and 2 dynamic hip screws across a previously seen intertrochanteric fracture. IMPRESSION: Intraoperative visualization for ORIF of a right intertrochanteric femur fracture. Electronically Signed   By: Leanna Battles M.D.   On: 10/15/2023 12:47   DG C-Arm 1-60 Min-No  Report Result Date: 10/15/2023 Fluoroscopy was utilized by the requesting physician.  No radiographic interpretation.   DG Pelvis 1-2 Views Result Date: 10/14/2023 CLINICAL DATA:  Hip fracture.  View in traction EXAM: Right hip 2 VIEW COMPARISON:  Pelvis x-ray 10/14/2023 FINDINGS: Comminuted angulated fracture of the intertrochanteric region of the right hip. Level of foreshortening/overriding and angulation is slightly improved compared to previous x-ray. Underlying osteopenia. Preserved joint spaces. IMPRESSION: Slight improvement in alignment with significant residual angulation and foreshortening of the intertrochanteric hip fracture. Electronically Signed   By: Karen Kays M.D.   On: 10/14/2023 16:49   CT Head Wo Contrast Result Date: 10/14/2023 CLINICAL DATA:  Fall, with small hematoma and laceration to right forehead EXAM: CT HEAD WITHOUT CONTRAST CT CERVICAL SPINE WITHOUT CONTRAST TECHNIQUE: Multidetector CT imaging of the head and cervical spine was performed following the standard protocol without intravenous contrast. Multiplanar CT image reconstructions of the cervical spine were also generated. RADIATION DOSE REDUCTION: This exam was performed according to the departmental dose-optimization program which includes automated exposure control, adjustment of the mA and/or kV according to patient size and/or use of iterative reconstruction technique. COMPARISON:  10/18/2022 CT head and cervical spine FINDINGS: CT HEAD FINDINGS Brain: No evidence of acute infarct, hemorrhage, mass, mass effect, or midline shift. No hydrocephalus or extra-axial fluid collection. Periventricular white matter changes, likely the sequela of chronic small vessel ischemic disease. Vascular: No hyperdense vessel. Atherosclerotic calcifications in the intracranial carotid and vertebral arteries. Skull: Negative for fracture or focal lesion. Small bilateral forehead hematomas with right forehead laceration. Sinuses/Orbits: Mild  mucosal thickening in the ethmoid air cells. No acute finding in the orbits. Other: The mastoid air cells are well aerated. CT CERVICAL SPINE FINDINGS Alignment: No traumatic listhesis. Skull base  and vertebrae: No acute fracture or suspicious osseous lesion. ACDF C5-C7. Additional osseous fusion of C3-C5. C6-C7 spinous process cerclage. Soft tissues and spinal canal: No prevertebral fluid or swelling. No visible canal hematoma. Disc levels: Degenerative changes in the cervical spine.No high-grade spinal canal stenosis. Upper chest: No focal pulmonary opacity or pleural effusion. IMPRESSION: 1. No acute intracranial process. 2. No acute fracture or traumatic listhesis in the cervical spine. 3. Small bilateral forehead hematomas with right forehead laceration. Electronically Signed   By: Wiliam Ke M.D.   On: 10/14/2023 13:23   CT Cervical Spine Wo Contrast Result Date: 10/14/2023 CLINICAL DATA:  Fall, with small hematoma and laceration to right forehead EXAM: CT HEAD WITHOUT CONTRAST CT CERVICAL SPINE WITHOUT CONTRAST TECHNIQUE: Multidetector CT imaging of the head and cervical spine was performed following the standard protocol without intravenous contrast. Multiplanar CT image reconstructions of the cervical spine were also generated. RADIATION DOSE REDUCTION: This exam was performed according to the departmental dose-optimization program which includes automated exposure control, adjustment of the mA and/or kV according to patient size and/or use of iterative reconstruction technique. COMPARISON:  10/18/2022 CT head and cervical spine FINDINGS: CT HEAD FINDINGS Brain: No evidence of acute infarct, hemorrhage, mass, mass effect, or midline shift. No hydrocephalus or extra-axial fluid collection. Periventricular white matter changes, likely the sequela of chronic small vessel ischemic disease. Vascular: No hyperdense vessel. Atherosclerotic calcifications in the intracranial carotid and vertebral arteries.  Skull: Negative for fracture or focal lesion. Small bilateral forehead hematomas with right forehead laceration. Sinuses/Orbits: Mild mucosal thickening in the ethmoid air cells. No acute finding in the orbits. Other: The mastoid air cells are well aerated. CT CERVICAL SPINE FINDINGS Alignment: No traumatic listhesis. Skull base and vertebrae: No acute fracture or suspicious osseous lesion. ACDF C5-C7. Additional osseous fusion of C3-C5. C6-C7 spinous process cerclage. Soft tissues and spinal canal: No prevertebral fluid or swelling. No visible canal hematoma. Disc levels: Degenerative changes in the cervical spine.No high-grade spinal canal stenosis. Upper chest: No focal pulmonary opacity or pleural effusion. IMPRESSION: 1. No acute intracranial process. 2. No acute fracture or traumatic listhesis in the cervical spine. 3. Small bilateral forehead hematomas with right forehead laceration. Electronically Signed   By: Wiliam Ke M.D.   On: 10/14/2023 13:23   DG Chest Port 1 View Result Date: 10/14/2023 CLINICAL DATA:  Fall. EXAM: PORTABLE CHEST 1 VIEW COMPARISON:  Chest radiograph dated 09/19/2023. FINDINGS: The heart size and mediastinal contours are within normal limits. Aortic atherosclerosis. No focal consolidation, sizeable pleural effusion, or pneumothorax. Severe degenerative changes of the right glenohumeral joint. Partially visualized postoperative changes of the cervical spine. No acute osseous abnormality identified. IMPRESSION: No acute findings in the chest. Electronically Signed   By: Hart Robinsons M.D.   On: 10/14/2023 12:26   DG Shoulder Right Port Result Date: 10/14/2023 CLINICAL DATA:  Shoulder pain status post fall. EXAM: RIGHT SHOULDER - 1 VIEW COMPARISON:  10/10/2023. FINDINGS: There is no evidence of acute fracture or dislocation. Severe degenerative changes of the right glenohumeral joint are again noted with severe joint space narrowing, marginal osteophytosis, and subchondral  sclerosis. Similar high-riding humeral head, likely relates to chronic rotator cuff pathology. Moderate degenerative changes of the acromioclavicular joint. Postoperative changes of the cervical spine. IMPRESSION: 1. No acute osseous abnormality. 2. Severe degenerative changes of the glenohumeral joint. 3. Similar high-riding humeral head, likely relates to chronic rotator cuff pathology. 4. Moderate degenerative changes of the acromioclavicular joint. Electronically Signed  By: Hart Robinsons M.D.   On: 10/14/2023 12:22   DG Hip Port [place] W or [place] Pelvis 1 View Right Result Date: 10/14/2023 CLINICAL DATA:  Hip pain status post fall. EXAM: DG HIP (WITH OR WITHOUT PELVIS) 1V PORT RIGHT COMPARISON:  09/19/2023. FINDINGS: Diffuse osseous demineralization. There is an acute comminuted and impacted intertrochanteric fracture of the right proximal femur. There is approximately 5.5 cm of superior displacement of the distal fracture component with apex varus angulation. The right femoral head is seated within the acetabulum. Status post ORIF of the left proximal femur. The sacroiliac joints and pubic symphysis are anatomically aligned. Degenerative changes of the visualized lower lumbar spine. IMPRESSION: Acute comminuted, displaced and impacted intertrochanteric fracture of the right proximal femur. Electronically Signed   By: Hart Robinsons M.D.   On: 10/14/2023 12:18       Rene Sizelove M.D. [facility] 10/16/2023, 11:03 AM  Available via Epic secure chat 7am-7pm After 7 pm, please refer to night coverage provider listed on amion.

## 2023-10-16 NOTE — Evaluation (Signed)
Occupational Therapy Evaluation Patient Details Name: Michelle Zavala MRN: [id number] DOB: [birth year] Today's Date: 10/16/2023   History of Present Illness Pt is a 78 y.o. female presenting 1/31 after fall at home. Found to have acute communicated displaced intertrochanteric fx of thr R femur. Now s/p R intertrochanteric intramedullary nailing 2/1, WBAT. PMH: HTN, CAD, PAD, PSVT, AAA, COPD, GERD.   Clinical Impression   PTA, pt lived alone and reports she was mod I for ADL and IADL. Pt reports neighbors can stop by to assist her as needed. Upon eval, pt limited predominantly by dizziness with positional changes and drop in BP by 20+ points. Of note, Hgb was 6.9 this morning. RN cleared to see and made aware of orthostatic vital signs. Pt with good initiation of mobility this session needing min HHA to elevate trunk and come to EOB. Suspect pt will progress well; able to sit EOB for two 3-5 minute bouts positive for dizziness both attempts. Will continue to follow. Recommend inpatient rehab <3 hours/day.       If plan is discharge home, recommend the following: Assistance with cooking/housework;Assist for transportation;Help with stairs or ramp for entrance;A lot of help with walking and/or transfers;A lot of help with bathing/dressing/bathroom    Functional Status Assessment  Patient has had a recent decline in their functional status and demonstrates the ability to make significant improvements in function in a reasonable and predictable amount of time.  Equipment Recommendations  Other (comment) (defer to next venue of care)    Recommendations for Other Services       Precautions / Restrictions Precautions Precautions: Fall Restrictions Weight Bearing Restrictions Per Provider Order: Yes RLE Weight Bearing Per Provider Order: Weight bearing as tolerated      Mobility Bed Mobility Overal bed mobility: Needs Assistance Bed Mobility: Supine to Sit, Sit to Supine     Supine to sit: Min  assist Sit to supine: Contact guard assist   General bed mobility comments: min A hand held assist for truncal elevation    Transfers                   General transfer comment: deferred secondary to orthostatic BPs and positive symptoms      Balance Overall balance assessment: Needs assistance Sitting-balance support: No upper extremity supported, Feet supported, Bilateral upper extremity supported   Sitting balance - Comments: statically pt's balance is fair+; up to mod A with onset dizziness at EOB with pt initially initiating return to supine and once redirected for BP to be taken in sitting, OT behind pt and intermittently leaning back onto OT at mod A                                   ADL either performed or assessed with clinical judgement   ADL Overall ADL's : Needs assistance/impaired Eating/Feeding: Modified independent;Bed level   Grooming: Set up;Bed level   Upper Body Bathing: Contact guard assist;Sitting   Lower Body Bathing: Maximal assistance;Bed level   Upper Body Dressing : Contact guard assist;Sitting   Lower Body Dressing: Bed level;Total assistance Lower Body Dressing Details (indicate cue type and reason): OT doneed socks for pt   Toilet Transfer Details (indicate cue type and reason): deferred secondary to low BP                 Vision Patient Visual Report: No change from baseline Vision Assessment?: No apparent  visual deficits     Perception Perception: Not tested       Praxis Praxis: Not tested       Pertinent Vitals/Pain Pain Assessment Pain Assessment: Faces Faces Pain Scale: Hurts a little bit Pain Location: R hip Pain Descriptors / Indicators: Discomfort, Sore Pain Intervention(s): Monitored during session, Limited activity within patient's tolerance     Extremity/Trunk Assessment Upper Extremity Assessment Upper Extremity Assessment: Generalized weakness   Lower Extremity Assessment Lower  Extremity Assessment: Defer to PT evaluation   Cervical / Trunk Assessment Cervical / Trunk Assessment:  (forward rounding of shoulders)   Communication Communication Communication: Hearing impairment Cueing Techniques: Verbal cues;Tactile cues;Gestural cues   Cognition Arousal: Alert Behavior During Therapy: WFL for tasks assessed/performed Overall Cognitive Status: Within Functional Limits for tasks assessed                                 General Comments: hard of hearing; follows basic commands but cognition not formally assessed. WFL for basic mobility assessed.     General Comments  BP supine after initial return to supine from sititng secondary to dizziness : 117/71 (82) HR 106; sitting EOB  111/90 (98) 108 questionable reading with pt moving frequently; sitting EOB ~3 mins 87/72 (79) HR 114; return to supine and moved to chair position in bed with HOB 54 degrees 93/75 (81) HR 97; after 5 mins 84/65 (72) HR 102 and OT returned HOB to 30 degrees with Pt BO 102/68 (78) HR 99 on departure. RN aware and present    Exercises     Shoulder Instructions      Home Living Family/patient expects to be discharged to:: Private residence Living Arrangements: Alone Available Help at Discharge: Neighbor Type of Home: Apartment Home Access: Level entry     Home Layout: One level     Bathroom Shower/Tub: Walk-in Pensions consultant: Standard     Home Equipment: Agricultural consultant (2 wheels);Grab bars - tub/shower;Shower seat          Prior Functioning/Environment Prior Level of Function : Independent/Modified Independent;Driving             Mobility Comments: no AD ADLs Comments: independent        OT Problem List: Decreased strength;Decreased activity tolerance;Impaired balance (sitting and/or standing);Decreased knowledge of use of DME or AE;Decreased knowledge of precautions;Cardiopulmonary status limiting activity (low BPs at time of eval)       OT Treatment/Interventions: Self-care/ADL training;Therapeutic exercise;DME and/or AE instruction;Balance training;Patient/family education;Therapeutic activities    OT Goals(Current goals can be found in the care plan section) Acute Rehab OT Goals Patient Stated Goal: go home OT Goal Formulation: With patient Time For Goal Achievement: 10/30/23 Potential to Achieve Goals: Good  OT Frequency: Min 1X/week    Co-evaluation              AM-PAC OT "6 Clicks" Daily Activity     Outcome Measure Help from another person eating meals?: None Help from another person taking care of personal grooming?: A Little Help from another person toileting, which includes using toliet, bedpan, or urinal?: A Lot Help from another person bathing (including washing, rinsing, drying)?: A Lot Help from another person to put on and taking off regular upper body clothing?: A Little Help from another person to put on and taking off regular lower body clothing?: A Lot 6 Click Score: 16   End of Session Nurse Communication:  Mobility status  Activity Tolerance: Patient tolerated treatment well;Treatment limited secondary to medical complications (Comment) (>20 pt drop in BP) Patient left: in bed;with call bell/phone within reach;with bed alarm set;with nursing/sitter in room  OT Visit Diagnosis: Unsteadiness on feet (R26.81);Muscle weakness (generalized) (M62.81);Pain;History of falling (Z91.81)                Time: 7829-5621 OT Time Calculation (min): 30 min Charges:  OT General Charges $OT Visit: 1 Visit OT Evaluation $OT Eval Low Complexity: 1 Low  Tyler Deis, OTR/L [facility] Acute Rehabilitation Office: [phone]   Myrla Halsted 10/16/2023, 11:49 AM

## 2023-10-17 ENCOUNTER — Encounter (HOSPITAL_COMMUNITY): Payer: Self-pay | Admitting: Orthopedic Surgery

## 2023-10-17 DIAGNOSIS — J410 Simple chronic bronchitis: Secondary | ICD-10-CM | POA: Diagnosis not present

## 2023-10-17 DIAGNOSIS — N390 Urinary tract infection, site not specified: Secondary | ICD-10-CM | POA: Diagnosis present

## 2023-10-17 DIAGNOSIS — I10 Essential (primary) hypertension: Secondary | ICD-10-CM | POA: Diagnosis not present

## 2023-10-17 DIAGNOSIS — W19XXXA Unspecified fall, initial encounter: Secondary | ICD-10-CM | POA: Diagnosis not present

## 2023-10-17 DIAGNOSIS — S72001A Fracture of unspecified part of neck of right femur, initial encounter for closed fracture: Secondary | ICD-10-CM | POA: Diagnosis not present

## 2023-10-17 LAB — URINE CULTURE: Culture: 40000 — AB

## 2023-10-17 LAB — BASIC METABOLIC PANEL
Anion gap: 10 (ref 5–15)
BUN: 46 mg/dL — ABNORMAL HIGH (ref 8–23)
CO2: 19 mmol/L — ABNORMAL LOW (ref 22–32)
Calcium: 8.5 mg/dL — ABNORMAL LOW (ref 8.9–10.3)
Chloride: 104 mmol/L (ref 98–111)
Creatinine, Ser: 1.66 mg/dL — ABNORMAL HIGH (ref 0.44–1.00)
GFR, Estimated: 31 mL/min — ABNORMAL LOW (ref >60)
Glucose, Bld: 110 mg/dL — ABNORMAL HIGH (ref 70–99)
Potassium: 4.5 mmol/L (ref 3.5–5.1)
Sodium: 133 mmol/L — ABNORMAL LOW (ref 135–145)

## 2023-10-17 LAB — BPAM RBC
Blood Product Expiration Date: 202502242359
Blood Product Expiration Date: 202502242359
ISSUE DATE / TIME: 202502021330
ISSUE DATE / TIME: 202502021828
Unit Type and Rh: 6200
Unit Type and Rh: 6200

## 2023-10-17 LAB — TYPE AND SCREEN
ABO/RH(D): A POS
Antibody Screen: NEGATIVE
Unit division: 0
Unit division: 0

## 2023-10-17 LAB — CBC
HCT: 26.2 % — ABNORMAL LOW (ref 36.0–46.0)
Hemoglobin: 9 g/dL — ABNORMAL LOW (ref 12.0–15.0)
MCH: 29.2 pg (ref 26.0–34.0)
MCHC: 34.4 g/dL (ref 30.0–36.0)
MCV: 85.1 fL (ref 80.0–100.0)
Platelets: 162 10*3/uL (ref 150–400)
RBC: 3.08 MIL/uL — ABNORMAL LOW (ref 3.87–5.11)
RDW: 15.4 % (ref 11.5–15.5)
WBC: 14.7 10*3/uL — ABNORMAL HIGH (ref 4.0–10.5)
nRBC: 0 % (ref 0.0–0.2)

## 2023-10-17 MED ORDER — SODIUM CHLORIDE 0.9 % IV SOLN: 1.0000 g | Freq: Three times a day (TID) | INTRAVENOUS | Status: DC

## 2023-10-17 MED ORDER — ENOXAPARIN SODIUM 30 MG/0.3ML IJ SOSY
30.0000 mg | PREFILLED_SYRINGE | INTRAMUSCULAR | Status: DC
  Administered 2023-10-17 – 2023-10-19 (×3): 30 mg via SUBCUTANEOUS
  Filled 2023-10-17 (×3): qty 0.3

## 2023-10-17 MED ORDER — SODIUM CHLORIDE 0.9 % IV SOLN
1.0000 g | Freq: Two times a day (BID) | INTRAVENOUS | Status: DC
  Administered 2023-10-17 – 2023-10-20 (×7): 1 g via INTRAVENOUS
  Filled 2023-10-17 (×7): qty 20

## 2023-10-17 NOTE — Progress Notes (Signed)
.  Subjective: 2 Days Post-Op Procedure(s) (LRB): RIGHT INTRAMEDULLARY (IM) NAIL INTERTROCHANTERIC (Right)  No events overnight. Pain. Worked more with PT today. Hgb 6.9 yesterday. Received 2u pRBCs   Activity level:  WBAT RLE Diet tolerance:  as tolerated Patient reports pain as moderate.   Objective: Vital signs in last 24 hours: Temp:  [97.8 F (36.6 C)-98.7 F (37.1 C)] 97.9 F (36.6 C) (02/03 0751) Pulse Rate:  [80-110] 90 (02/03 0751) Resp:  [16-17] 17 (02/02 1859) BP: (91-140)/(60-84) 124/84 (02/03 0751) SpO2:  [96 %-100 %] 96 % (02/03 0751)  Labs: Recent Labs    10/14/23 1841 10/15/23 1013 10/16/23 0914 10/17/23 0452  HGB 9.6* 8.4* 6.9* 9.0*   Recent Labs    10/16/23 0914 10/17/23 0452  WBC 16.1* 14.7*  RBC 2.32*  2.32* 3.08*  HCT 20.5* 26.2*  PLT 157 162   Recent Labs    10/16/23 0614 10/17/23 0452  NA 134* 133*  K 5.0 4.5  CL 105 104  CO2 17* 19*  BUN 39* 46*  CREATININE 1.68* 1.66*  GLUCOSE 145* 110*  CALCIUM 8.6* 8.5*   No results for input(s): "LABPT", "INR" in the last 72 hours.  Physical Exam:  Neurologically intact ABD soft Neurovascular intact Sensation intact distally Intact pulses distally Dorsiflexion/Plantar flexion intact Incision: scant drainage  Assessment/Plan:  2 Days Post-Op Procedure(s) (LRB): RIGHT INTRAMEDULLARY (IM) NAIL INTERTROCHANTERIC (Right) WBAT RLE PT/OT Hgb now 9 from 6.9 after 2u pRBCs. Likely due to acute blood loss anemia from fracture and surgery. Continue to trend CBC and transfuse as needed Pain control with PRN pain medication preferring oral medicines  DVT ppx-Lovenox 40mg  daily- currently being held due to anemia   Luci Bank 10/17/2023, 12:28 PM
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
## 2023-10-17 NOTE — Progress Notes (Signed)
Physical Therapy Treatment Patient Details Name: Michelle Zavala MRN: [id number] DOB: [birth year] Today's Date: 10/17/2023   History of Present Illness Pt is a 78 y.o. female presenting 1/31 after fall at home. Found to have acute communicated displaced intertrochanteric fx of thr R femur. Now s/p R intertrochanteric intramedullary nailing 2/1, WBAT. PMH: HTN, CAD, PAD, PSVT, AAA, COPD, GERD.    PT Comments  Pt received in supine, c/o discomfort after sitting EOB to eat and asking for assist but no one came to help her. Pt cooperative and agreeable to work on transfer training with encouragement, using RW. Pt needing up to minA for bed mobility and min to modA for transfer lift assist and sidesteps along EOB to simulate step pivot transfer, ~77ft. Pt standing tolerance limited due to c/o severe R hip pain and episode of urinary incontinence, RN/NT notified pt will need lower body bath and pt assist to doff/don new purewick while awaiting bath. Pt BP remains soft sitting EOB, she may benefit from BLE TED hose or compression socks to see if this improves her BP stability for gait progression next session. Pt will continue to benefit from skilled rehab in a post acute setting < 3 hours per day to maximize functional gains before returning home. Patient Position (if appropriate) Orthostatic Vitals  Orthostatic Lying   BP- Lying 104/76  Pulse- Lying 86  Orthostatic Sitting  BP- Sitting 91/55  Orthostatic Standing at 0 minutes  BP- Standing at 0 minutes  (defer, limited standing due to pain)       If plan is discharge home, recommend the following: A lot of help with walking and/or transfers;A lot of help with bathing/dressing/bathroom;Help with stairs or ramp for entrance;Assistance with cooking/housework   Can travel by private vehicle     No  Equipment Recommendations  Rolling walker (2 wheels);BSC/3in1    Recommendations for Other Services       Precautions / Restrictions  Precautions Precautions: Fall Precaution Comments: Contact precs; soft BP Restrictions Weight Bearing Restrictions Per Provider Order: Yes RLE Weight Bearing Per Provider Order: Weight bearing as tolerated     Mobility  Bed Mobility Overal bed mobility: Needs Assistance Bed Mobility: Supine to Sit, Sit to Supine     Supine to sit: Min assist Sit to supine: Min assist   General bed mobility comments: min A hand held assist for truncal elevation from flat bed, and BLE assist to return to supine safely    Transfers Overall transfer level: Needs assistance Equipment used: Rolling walker (2 wheels) Transfers: Sit to/from Stand, Bed to chair/wheelchair/BSC Sit to Stand: Mod assist   Step pivot transfers: Min assist       General transfer comment: limited to ~23ft sidesteps along EOB (from foot of bed to [facility]) due to RLE pain and pt urinary incontinence.    Ambulation/Gait               General Gait Details: defer gait away from bed due to increased c/o pain standing and urinary incontinence       Balance Overall balance assessment: Needs assistance Sitting-balance support: No upper extremity supported, Feet supported, Single extremity supported Sitting balance-Leahy Scale: Good Sitting balance - Comments: no c/o dizziness; BP is soft sitting EOB but better than on eval pt using L end of bed rail for seated balance   Standing balance support: Bilateral upper extremity supported, Reliant on assistive device for balance Standing balance-Leahy Scale: Poor Standing balance comment: heavy reliance on RW due to RLE  pain                            Cognition Arousal: Alert Behavior During Therapy: WFL for tasks assessed/performed Overall Cognitive Status: Within Functional Limits for tasks assessed                                 General Comments: hard of hearing; follows basic commands but cognition not formally assessed. WFL for basic mobility  assessed.        Exercises      General Comments General comments (skin integrity, edema, etc.): sitting ankle pumps and supine heel slides x5 reps ea      Pertinent Vitals/Pain Pain Assessment Pain Assessment: Faces Faces Pain Scale: Hurts even more Pain Location: R hip Pain Descriptors / Indicators: Discomfort, Sore, Grimacing, Guarding Pain Intervention(s): Limited activity within patient's tolerance, Monitored during session, Repositioned, Patient requesting pain meds-RN notified, Other (comment) (RN agreeable to bring her R hip ice pack when she comes to room to give pain meds)     PT Goals (current goals can now be found in the care plan section) Acute Rehab PT Goals Patient Stated Goal: be able to get home PT Goal Formulation: With patient Time For Goal Achievement: 10/30/23 Progress towards PT goals: Progressing toward goals    Frequency    Min 1X/week      PT Plan         AM-PAC PT "6 Clicks" Mobility   Outcome Measure  Help needed turning from your back to your side while in a flat bed without using bedrails?: A Little Help needed moving from lying on your back to sitting on the side of a flat bed without using bedrails?: A Little Help needed moving to and from a bed to a chair (including a wheelchair)?: A Lot Help needed standing up from a chair using your arms (e.g., wheelchair or bedside chair)?: A Lot Help needed to walk in hospital room?: Total Help needed climbing 3-5 steps with a railing? : Total 6 Click Score: 12    End of Session Equipment Utilized During Treatment: Gait belt Activity Tolerance: Patient tolerated treatment well;Patient limited by pain;Other (comment) (BP drop sitting, did not check standing due to urinary incontinence and pt RLE pain) Patient left: with call bell/phone within reach;in bed;with bed alarm set (RLE elevated on yellow bone foam) Nurse Communication: Mobility status;Patient requests pain meds;Precautions;Other  (comment) (BP) PT Visit Diagnosis: Unsteadiness on feet (R26.81);Other abnormalities of gait and mobility (R26.89)     Time: 2952-8413 PT Time Calculation (min) (ACUTE ONLY): 16 min  Charges:    $Therapeutic Activity: 8-22 mins PT General Charges $$ ACUTE PT VISIT: 1 Visit                     Kelechi Astarita P., PTA Acute Rehabilitation Services Secure Chat Preferred 9a-5:30pm Office: [phone]    Angus Palms 10/17/2023, 6:42 PM
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
## 2023-10-17 NOTE — Progress Notes (Signed)
[facility]                                                                              Michelle Zavala, is a 78 y.o. female, DOB - 1946-08-21, ZOX:[id number] Admit date - 10/14/2023    Outpatient Primary MD for the patient is Kirstie Peri, MD  LOS - 3  days  Chief Complaint  Patient presents with   Fall       Brief summary   Patient is a 78 year old female with HTN, CAD, paroxysmal A-fib, PSVT, COPD, AAA, GERD presented after having a fall at home.  She was in the kitchen attempting to clean when she slipped on the water on the floor, resulting in the fall.  She hit the right side of her forehead, did not lose consciousness.  After the fall, patient had severe pain in her right hip and was unable to get up. Labs showed WBC 17.3, hemoglobin 9.6, creatinine 1.05 X-ray right shoulder showed no acute abnormal, severe degenerative changes of the glenohumeral joint. X-ray of the right hip showed acute comminuted displaced and impacted intertrochanteric fracture of the right proximal femur. Orthopedics was consulted.   Assessment & Plan    Principal Problem: Acute comminuted, displaced and impacted intertrochanteric fracture of the right proximal femur. -Orthopedics consulted, s/p right hip cephalomedullary nailing surgery, on 10/15/2023 (Dr Hulda Humphrey) -Hemoglobin 6.9 on 2/2, received 2 units packed RBCs, hemoglobin 9.0 today.   -Pain control, DVT prophylaxis per orthopedics -PT OT eval today   Active Problems: Acute blood loss anemia, postoperative on chronic normocytic anemia Folic acid deficiency - Hb 9.6 on admission, but previously had been 11.1 on 09/19/2023 - folate 4.4.  Hemoglobin 6.9 on 2/2 -Started on folic acid 1 mg daily -Transfused 2 units packed RBCs on 2/2, hemoglobin stable    Fall at home, initial encounter -PT evaluation  ESBL E. coli UTI -Patient was placed on IV Rocephin however urine culture showed 30,000 colonies of ESBL E. coli  - d/w pharmacist,  will place on IV meropenem for 5 days  Chronic systolic CHF, HFrEF -Currently euvolemic.  2D echo 08/2021 had shown EF of 35 to 40% with G1 DD -Currently Lasix, Aldactone on hold -Follows Dr Diona Browner outpatient  Acute kidney injury -Baseline creatinine 0.7-0.9 -Presented with creatinine of 1.0, trended up to 1.6 today -Hold Lasix, Aldactone -Hemoglobin 6.9, transfuse 2 units packed RBCs   Essential hypertension -Cardiology visit note from 08/10/2023 reviewed, patient had been recommended to start bisoprolol 5 mg daily, Aldactone 25 mg daily, furosemide 20 mg every other day, K 20 mEq every other day -Patient had been recommended by cardiology to stop Toprol-XL, chlorthalidone -Hold Lasix, Aldactone -Patient was recommended bisoprolol 5 mg daily, will place on bisoprolol once BP is more stable - currently BP soft.  Today orthostatic with PT, will hold off on adding bisoprolol.   COPD -No wheezing, continue DuoNebs as needed   Memory impairment -Continue Namenda 5 mg, outpatient follows neurology.  Last seen on 09/27/2023  -Continue delirium precautions.     Anxiety and depression -Continue amitriptyline, Zoloft, and Ativan   Nonobstructive CAD Mixed hyperlipidemia -  Last  cardiac catheterization 09/2021 demonstrated mild to moderate nonobstructive coronary disease.  - Last LDL 177, but patient has statin induced myalgias.   Ascending AAA Patient has a 4.2 cm AAA seen on previous CTA of the chest in 09/2022. -Continue outpatient screening   GERD -Continue Protonix  Estimated body mass index is 25.7 kg/m as calculated from the following:   Height as of this encounter: 5' 2.99" (1.6 m).   Weight as of this encounter: 65.8 kg.  Code Status: Full code DVT Prophylaxis:  enoxaparin (LOVENOX) injection 40 mg Start: 10/14/23 2200   Level of Care: Level of care: Telemetry Surgical Family Communication: Updated patient Disposition Plan:      Remains inpatient appropriate:       Procedures:  10/15/2023 right hip cephalomedullary nailing   Consultants:   Orthopedics  Antimicrobials:   Anti-infectives (From admission, onward)    Start     Dose/Rate Route Frequency Ordered Stop   10/17/23 1400  meropenem (MERREM) 1 g in sodium chloride 0.9 % 100 mL IVPB        1 g 200 mL/hr over 30 Minutes Intravenous Every 8 hours 10/17/23 1201 10/22/23 1359   10/15/23 1800  cefTRIAXone (ROCEPHIN) 1 g in sodium chloride 0.9 % 100 mL IVPB  Status:  Discontinued        1 g 200 mL/hr over 30 Minutes Intravenous Every 24 hours 10/15/23 0608 10/17/23 1201   10/15/23 1400  ceFAZolin (ANCEF) IVPB 1 g/50 mL premix        1 g 100 mL/hr over 30 Minutes Intravenous Every 8 hours 10/15/23 0955 10/16/23 0508   10/15/23 0652  ceFAZolin (ANCEF) 2-4 GM/100ML-% IVPB       Note to Pharmacy: Kathrene Bongo D: cabinet override      10/15/23 0652 10/15/23 0758   10/15/23 0630  ceFAZolin (ANCEF) IVPB 2g/100 mL premix        2 g 200 mL/hr over 30 Minutes Intravenous On call to O.R. 10/15/23 0543 10/15/23 0745          Medications  amitriptyline  75 mg Oral QHS   enoxaparin (LOVENOX) injection  40 mg Subcutaneous Q24H   folic acid  1 mg Oral Daily   LORazepam  1 mg Oral BID   memantine  5 mg Oral QHS   pantoprazole  40 mg Oral QHS   sertraline  100 mg Oral QHS      Subjective:   Michelle Zavala was seen and examined today.  No acute complaints.  No chest pain, shortness of breath, fevers, chills cough.  Will start PT today.  Objective:   Vitals:   10/16/23 1935 10/16/23 2123 10/17/23 0437 10/17/23 0751  BP: 138/73 (!) 140/63 128/77 124/84  Pulse: 80 84 84 90  Resp:      Temp: 98.3 F (36.8 C) 98.7 F (37.1 C) 98 F (36.7 C) 97.9 F (36.6 C)  TempSrc: Oral Oral Oral Oral  SpO2: 99% 99% 99% 96%  Weight:      Height:        Intake/Output Summary (Last 24 hours) at 10/17/2023 1218 Last data filed at 10/16/2023 2230 Gross per 24 hour  Intake 1274 ml  Output --  Net  1274 ml     Wt Readings from Last 3 Encounters:  10/15/23 65.8 kg  09/27/23 65.8 kg  09/19/23 66.7 kg   Physical Exam General: Alert and oriented x 3, NAD Cardiovascular: S1 S2 clear, RRR.  Respiratory: CTAB, no wheezing Gastrointestinal: Soft,  nontender, nondistended, NBS Ext: no pedal edema bilaterally Neuro: no new deficits Psych: Normal affect   Data Reviewed:  I have personally reviewed following labs    CBC Lab Results  Component Value Date   WBC 14.7 (H) 10/17/2023   RBC 3.08 (L) 10/17/2023   HGB 9.0 (L) 10/17/2023   HCT 26.2 (L) 10/17/2023   MCV 85.1 10/17/2023   MCH 29.2 10/17/2023   PLT 162 10/17/2023   MCHC 34.4 10/17/2023   RDW 15.4 10/17/2023   LYMPHSABS 0.9 10/14/2023   MONOABS 1.0 10/14/2023   EOSABS 0.0 10/14/2023   BASOSABS 0.1 10/14/2023     Last metabolic panel Lab Results  Component Value Date   NA 133 (L) 10/17/2023   K 4.5 10/17/2023   CL 104 10/17/2023   CO2 19 (L) 10/17/2023   BUN 46 (H) 10/17/2023   CREATININE 1.66 (H) 10/17/2023   GLUCOSE 110 (H) 10/17/2023   GFRNONAA 31 (L) 10/17/2023   GFRAA 57 (L) 07/30/2020   CALCIUM 8.5 (L) 10/17/2023   PROT 6.8 10/18/2022   ALBUMIN 4.0 10/18/2022   BILITOT 0.4 10/18/2022   ALKPHOS 56 10/18/2022   AST 24 10/18/2022   ALT 15 10/18/2022   ANIONGAP 10 10/17/2023    CBG (last 3)  No results for input(s): "GLUCAP" in the last 72 hours.    Coagulation Profile: No results for input(s): "INR", "PROTIME" in the last 168 hours.   Radiology Studies: I have personally reviewed the imaging studies  No results found.      Thad Ranger M.D. [facility] 10/17/2023, 12:18 PM  Available via Epic secure chat 7am-7pm After 7 pm, please refer to night coverage provider listed on amion.

## 2023-10-18 DIAGNOSIS — D72823 Leukemoid reaction: Secondary | ICD-10-CM | POA: Diagnosis not present

## 2023-10-18 DIAGNOSIS — I5020 Unspecified systolic (congestive) heart failure: Secondary | ICD-10-CM | POA: Diagnosis not present

## 2023-10-18 DIAGNOSIS — W19XXXA Unspecified fall, initial encounter: Secondary | ICD-10-CM | POA: Diagnosis not present

## 2023-10-18 DIAGNOSIS — S72001A Fracture of unspecified part of neck of right femur, initial encounter for closed fracture: Secondary | ICD-10-CM | POA: Diagnosis not present

## 2023-10-18 LAB — BASIC METABOLIC PANEL
Anion gap: 13 (ref 5–15)
BUN: 39 mg/dL — ABNORMAL HIGH (ref 8–23)
CO2: 21 mmol/L — ABNORMAL LOW (ref 22–32)
Calcium: 8.5 mg/dL — ABNORMAL LOW (ref 8.9–10.3)
Chloride: 102 mmol/L (ref 98–111)
Creatinine, Ser: 1.34 mg/dL — ABNORMAL HIGH (ref 0.44–1.00)
GFR, Estimated: 41 mL/min — ABNORMAL LOW (ref >60)
Glucose, Bld: 106 mg/dL — ABNORMAL HIGH (ref 70–99)
Potassium: 4 mmol/L (ref 3.5–5.1)
Sodium: 136 mmol/L (ref 135–145)

## 2023-10-18 LAB — CBC
HCT: 23.9 % — ABNORMAL LOW (ref 36.0–46.0)
Hemoglobin: 8 g/dL — ABNORMAL LOW (ref 12.0–15.0)
MCH: 28.9 pg (ref 26.0–34.0)
MCHC: 33.5 g/dL (ref 30.0–36.0)
MCV: 86.3 fL (ref 80.0–100.0)
Platelets: 183 10*3/uL (ref 150–400)
RBC: 2.77 MIL/uL — ABNORMAL LOW (ref 3.87–5.11)
RDW: 15 % (ref 11.5–15.5)
WBC: 10.7 10*3/uL — ABNORMAL HIGH (ref 4.0–10.5)
nRBC: 0 % (ref 0.0–0.2)

## 2023-10-18 NOTE — Plan of Care (Signed)

## 2023-10-18 NOTE — TOC CAGE-AID Note (Signed)
 Transition of Care [facility]) - CAGE-AID Screening   Patient Details  Name: Michelle Zavala MRN: [id number] Date of Birth: 03-05-1946  Transition of Care [facility]) CM/SW Contact:    LEBRON ROCKIE ORN, RN Phone Number: [phone] 10/18/2023, 6:48 PM   Clinical Narrative: Pt in hospital after sustaining a R hip fx due to a fall.  Pt denies alcohol or drug use.  Screening complete.   CAGE-AID Screening:    Have You Ever Felt You Ought to Cut Down on Your Drinking or Drug Use?: No Have People Annoyed You By Critizing Your Drinking Or Drug Use?: No Have You Felt Bad Or Guilty About Your Drinking Or Drug Use?: No Have You Ever Had a Drink or Used Drugs First Thing In The Morning to Steady Your Nerves or to Get Rid of a Hangover?: No CAGE-AID Score: 0  Substance Abuse Education Offered: No

## 2023-10-18 NOTE — TOC Progression Note (Addendum)
 Transition of Care [facility]) - Progression Note    Patient Details  Name: Michelle Zavala MRN: [id number] Date of Birth: 1946-08-11  Transition of Care [facility]) CM/SW Contact  Bridget Cordella Simmonds, LCSW Phone Number: 10/18/2023, 10:20 AM  Clinical Narrative:   MARCO: [id number] A.  1030: [facility] does offer bed but will not have room until 2/6.  Expected Discharge Plan: Skilled Nursing Facility Barriers to Discharge: Continued Medical Work up, SNF Pending bed offer  Expected Discharge Plan and Services In-house Referral: Clinical Social Work   Post Acute Care Choice: Skilled Nursing Facility Living arrangements for the past 2 months: Single Family Home                                       Social Determinants of Health (SDOH) Interventions SDOH Screenings   Food Insecurity: No Food Insecurity (10/15/2023)  Housing: Low Risk  (10/15/2023)  Transportation Needs: No Transportation Needs (10/15/2023)  Utilities: Not At Risk (10/15/2023)  Alcohol Screen: Low Risk  (04/22/2022)  Depression (PHQ2-9): Medium Risk (05/09/2023)  Financial Resource Strain: Low Risk  (04/22/2022)  Physical Activity: Inactive (05/09/2023)  Social Connections: Socially Isolated (10/15/2023)  Stress: Stress Concern Present (05/09/2023)  Tobacco Use: Low Risk  (10/15/2023)  Health Literacy: Low Risk  (11/27/2020)   Received from [facility], [facility]    Readmission Risk Interventions    06/12/2022    1:18 PM  Readmission Risk Prevention Plan  Transportation Screening Complete  PCP or Specialist Appt within 5-7 Days Not Complete  Home Care Screening Complete  Medication Review (RN CM) Complete

## 2023-10-18 NOTE — Progress Notes (Signed)
 Michelle Hospitalist                                                                              Michelle Zavala, is a 78 y.o. female, DOB - [birth year], FMW:[id number] Admit date - 10/14/2023    Outpatient Primary MD for the patient is Michelle Isles, MD  LOS - 4  days  Chief Complaint  Patient presents with   Fall       Brief summary   Patient is a 78 year old female with HTN, CAD, paroxysmal A-fib, PSVT, COPD, AAA, GERD presented after having a fall at home.  She was in the kitchen attempting to clean when she slipped on the water  on the floor, resulting in the fall.  She hit the right side of her forehead, did not lose consciousness.  After the fall, patient had severe pain in her right hip and was unable to get up. Labs showed WBC 17.3, hemoglobin 9.6, creatinine 1.05 X-ray right shoulder showed no acute abnormal, severe degenerative changes of the glenohumeral joint. X-ray of the right hip showed acute comminuted displaced and impacted intertrochanteric fracture of the right proximal femur. Orthopedics was consulted.   Assessment & Plan    Principal Problem: Acute comminuted, displaced and impacted intertrochanteric fracture of the right proximal femur. -Orthopedics consulted, s/p right hip cephalomedullary nailing surgery, on 10/15/2023 (Dr Sherida) -Hemoglobin 6.9 on 2/2, received 2 units packed RBCs -Hemoglobin 8.0 today -Pain control, DVT prophylaxis per orthopedics -PT OT evaluation recommended SNF   Active Problems: Acute blood loss anemia, postoperative on chronic normocytic anemia Folic acid  deficiency - Hb 9.6 on admission, but previously had been 11.1 on 09/19/2023 - folate 4.4.  Hemoglobin 6.9 on 2/2 -Started on folic acid  1 mg daily -Transfused 2 units packed RBCs on 2/2, hemoglobin stable 8.0    Fall at home, initial encounter -PT evaluation  ESBL E. coli UTI -Patient was placed on IV Rocephin  however urine culture showed 30,000 colonies of ESBL E. coli   - d/w pharmacist, placed on IV meropenem  for 5 days, day #2  Chronic systolic CHF, HFrEF -Currently euvolemic.  2D echo 08/2021 had shown EF of 35 to 40% with G1 DD -Currently Lasix , Aldactone  on hold -Follows Dr Debera outpatient  Acute kidney injury -Baseline creatinine 0.7-0.9 -Presented with creatinine of 1.0, trended up to 1.6 on 2/3 -Lasix , Aldactone  held -Patient received 2 units packed RBCs on 2/2 -Creatinine improving, 1.3 today   Essential hypertension -Cardiology visit note from 08/10/2023 reviewed, patient had been recommended to start bisoprolol  5 mg daily, Aldactone  25 mg daily, furosemide  20 mg every other day, K 20 mEq every other day -Patient had been recommended by cardiology to stop Toprol -XL, chlorthalidone -Currently holding Lasix , Aldactone  -Patient was recommended bisoprolol  5 mg daily, will place on bisoprolol  once BP is more stable  Orthostatic -Felt dizzy and somewhat orthostatic on standing up with PT evaluation. -Placed a TED hose, knee-high -Continue to hold Lasix , Aldactone , beta-blocker   COPD -No wheezing, continue DuoNebs as needed   Memory impairment -Continue Namenda  5 mg, outpatient follows neurology.  Last seen on 09/27/2023  -Continue delirium precautions.  Anxiety and depression -Continue amitriptyline , Zoloft , and Ativan    Nonobstructive CAD Mixed hyperlipidemia -  Last cardiac catheterization 09/2021 demonstrated mild to moderate nonobstructive coronary disease.  - Last LDL 177, but patient has statin induced myalgias.   Ascending AAA Patient has a 4.2 cm AAA seen on previous CTA of the chest in 09/2022. -Continue outpatient screening   GERD -Continue Protonix   Estimated body mass index is 25.7 kg/m as calculated from the following:   Height as of this encounter: 5' 2.99 (1.6 m).   Weight as of this encounter: 65.8 kg.  Code Status: Full code DVT Prophylaxis:  Place TED hose Start: 10/18/23 1011 enoxaparin  (LOVENOX )  injection 30 mg Start: 10/17/23 2200   Level of Care: Level of care: Telemetry Surgical Family Communication: Updated patient Disposition Plan:      Remains inpatient appropriate:      Procedures:  10/15/2023 right hip cephalomedullary nailing   Consultants:   Orthopedics  Antimicrobials:   Anti-infectives (From admission, onward)    Start     Dose/Rate Route Frequency Ordered Stop   10/17/23 1500  meropenem  (MERREM ) 1 g in sodium chloride  0.9 % 100 mL IVPB        1 g 200 mL/hr over 30 Minutes Intravenous Every 12 hours 10/17/23 1407 10/22/23 0959   10/17/23 1400  meropenem  (MERREM ) 1 g in sodium chloride  0.9 % 100 mL IVPB  Status:  Discontinued        1 g 200 mL/hr over 30 Minutes Intravenous Every 8 hours 10/17/23 1201 10/17/23 1407   10/15/23 1800  cefTRIAXone  (ROCEPHIN ) 1 g in sodium chloride  0.9 % 100 mL IVPB  Status:  Discontinued        1 g 200 mL/hr over 30 Minutes Intravenous Every 24 hours 10/15/23 0608 10/17/23 1201   10/15/23 1400  ceFAZolin  (ANCEF ) IVPB 1 g/50 mL premix        1 g 100 mL/hr over 30 Minutes Intravenous Every 8 hours 10/15/23 0955 10/16/23 0508   10/15/23 0652  ceFAZolin  (ANCEF ) 2-4 GM/100ML-% IVPB       Note to Pharmacy: Michelle Zavala D: cabinet override      10/15/23 0652 10/15/23 0758   10/15/23 0630  ceFAZolin  (ANCEF ) IVPB 2g/100 mL premix        2 g 200 mL/hr over 30 Minutes Intravenous On call to O.R. 10/15/23 0543 10/15/23 0745          Medications  amitriptyline   75 mg Oral QHS   enoxaparin  (LOVENOX ) injection  30 mg Subcutaneous Q24H   folic acid   1 mg Oral Daily   LORazepam   1 mg Oral BID   memantine   5 mg Oral QHS   pantoprazole   40 mg Oral QHS   sertraline   100 mg Oral QHS      Subjective:   Michelle Zavala was seen and examined today.  No acute complaints.  Pain is controlled.  No chest pain, shortness of breath, fevers or chills.   Objective:   Vitals:   10/17/23 1510 10/17/23 2200 10/18/23 0428 10/18/23 0723  BP:  98/63 (!) 151/70 116/73 (!) 125/55  Pulse: 91 87  86  Resp:      Temp: 97.6 F (36.4 C) 98.1 F (36.7 C) 97.7 F (36.5 C) 97.7 F (36.5 C)  TempSrc: Oral Oral Oral Oral  SpO2: 100% 99% 96% 99%  Weight:      Height:        Intake/Output Summary (Last 24 hours) at 10/18/2023  1416 Last data filed at 10/18/2023 1252 Gross per 24 hour  Intake 540 ml  Output --  Net 540 ml     Wt Readings from Last 3 Encounters:  10/15/23 65.8 kg  09/27/23 65.8 kg  09/19/23 66.7 kg   Physical Exam General: Alert and oriented x 3, NAD Cardiovascular: S1 S2 clear, RRR.  Respiratory: CTAB, no wheezing Gastrointestinal: Soft, nontender, nondistended, NBS Ext: no pedal edema bilaterally Neuro: no new deficits Psych: Normal affect   Data Reviewed:  I have personally reviewed following labs    CBC Lab Results  Component Value Date   WBC 10.7 (H) 10/18/2023   RBC 2.77 (L) 10/18/2023   HGB 8.0 (L) 10/18/2023   HCT 23.9 (L) 10/18/2023   MCV 86.3 10/18/2023   MCH 28.9 10/18/2023   PLT 183 10/18/2023   MCHC 33.5 10/18/2023   RDW 15.0 10/18/2023   LYMPHSABS 0.9 10/14/2023   MONOABS 1.0 10/14/2023   EOSABS 0.0 10/14/2023   BASOSABS 0.1 10/14/2023     Last metabolic panel Lab Results  Component Value Date   NA 136 10/18/2023   K 4.0 10/18/2023   CL 102 10/18/2023   CO2 21 (L) 10/18/2023   BUN 39 (H) 10/18/2023   CREATININE 1.34 (H) 10/18/2023   GLUCOSE 106 (H) 10/18/2023   GFRNONAA 41 (L) 10/18/2023   GFRAA 57 (L) 07/30/2020   CALCIUM  8.5 (L) 10/18/2023   PROT 6.8 10/18/2022   ALBUMIN 4.0 10/18/2022   BILITOT 0.4 10/18/2022   ALKPHOS 56 10/18/2022   AST 24 10/18/2022   ALT 15 10/18/2022   ANIONGAP 13 10/18/2023    CBG (last 3)  No results for input(s): GLUCAP in the last 72 hours.    Coagulation Profile: No results for input(s): INR, PROTIME in the last 168 hours.   Radiology Studies: I have personally reviewed the imaging studies  No results  found.      Michelle Zavala M.D. [facility] 10/18/2023, 2:16 PM  Available via Epic secure chat 7am-7pm After 7 pm, please refer to night coverage provider listed on amion.

## 2023-10-18 NOTE — Progress Notes (Signed)
 PT Cancellation Note  Patient Details Name: Michelle Zavala MRN: [id number] DOB: 26-Apr-1946   Cancelled Treatment:    Reason Eval/Treat Not Completed: (P) Patient declined, no reason specified (pt visiting with family who just arrived, states she just got back to bed, requesting to work with PTA later in the day if time.) Will continue efforts per PT plan of care as schedule permits later in the day.    Connell HERO Jakari Sada 10/18/2023, 4:15 PM

## 2023-10-18 NOTE — Progress Notes (Signed)
 Mobility Specialist Progress Note:    10/18/23 1300  Orthostatic Lying   BP- Lying 123/64  Orthostatic Sitting  BP- Sitting (!) 88/67  Mobility  Activity Transferred from bed to chair  Level of Assistance Minimal assist, patient does 75% or more  Assistive Device Front wheel walker  Distance Ambulated (ft) 6 ft  RLE Weight Bearing Per Provider Order WBAT  Activity Response Tolerated well  Mobility Referral Yes (chair follow)  Mobility visit 1 Mobility  Mobility Specialist Start Time (ACUTE ONLY) 1139  Mobility Specialist Stop Time (ACUTE ONLY) 1206  Mobility Specialist Time Calculation (min) (ACUTE ONLY) 27 min   Pt received in bed and agreeable. Required minA to come EOB. Asymptomatic despite BP drop. Pt able to stand and pivot to chair w/ minG. No complaints throughout. Pt left in chair with call bell and all needs met. Chair alarm on.  D'Vante Nicholaus Mobility Specialist Please contact via Special Educational Needs Teacher or Rehab office at [phone]

## 2023-10-19 DIAGNOSIS — D72823 Leukemoid reaction: Secondary | ICD-10-CM | POA: Diagnosis not present

## 2023-10-19 DIAGNOSIS — I5020 Unspecified systolic (congestive) heart failure: Secondary | ICD-10-CM | POA: Diagnosis not present

## 2023-10-19 DIAGNOSIS — W19XXXA Unspecified fall, initial encounter: Secondary | ICD-10-CM | POA: Diagnosis not present

## 2023-10-19 DIAGNOSIS — S72001A Fracture of unspecified part of neck of right femur, initial encounter for closed fracture: Secondary | ICD-10-CM | POA: Diagnosis not present

## 2023-10-19 LAB — BASIC METABOLIC PANEL
Anion gap: 9 (ref 5–15)
BUN: 27 mg/dL — ABNORMAL HIGH (ref 8–23)
CO2: 23 mmol/L (ref 22–32)
Calcium: 8.8 mg/dL — ABNORMAL LOW (ref 8.9–10.3)
Chloride: 103 mmol/L (ref 98–111)
Creatinine, Ser: 1.05 mg/dL — ABNORMAL HIGH (ref 0.44–1.00)
GFR, Estimated: 54 mL/min — ABNORMAL LOW (ref >60)
Glucose, Bld: 119 mg/dL — ABNORMAL HIGH (ref 70–99)
Potassium: 4 mmol/L (ref 3.5–5.1)
Sodium: 135 mmol/L (ref 135–145)

## 2023-10-19 LAB — CBC
HCT: 26.6 % — ABNORMAL LOW (ref 36.0–46.0)
Hemoglobin: 8.8 g/dL — ABNORMAL LOW (ref 12.0–15.0)
MCH: 29.1 pg (ref 26.0–34.0)
MCHC: 33.1 g/dL (ref 30.0–36.0)
MCV: 88.1 fL (ref 80.0–100.0)
Platelets: 230 10*3/uL (ref 150–400)
RBC: 3.02 MIL/uL — ABNORMAL LOW (ref 3.87–5.11)
RDW: 14.9 % (ref 11.5–15.5)
WBC: 10.5 10*3/uL (ref 4.0–10.5)
nRBC: 0 % (ref 0.0–0.2)

## 2023-10-19 MED ORDER — ACETAMINOPHEN 325 MG PO TABS
650.0000 mg | ORAL_TABLET | ORAL | Status: DC | PRN
  Administered 2023-10-19 – 2023-10-20 (×2): 650 mg via ORAL
  Filled 2023-10-19 (×2): qty 2

## 2023-10-19 MED ORDER — DOCUSATE SODIUM 100 MG PO CAPS
100.0000 mg | ORAL_CAPSULE | Freq: Two times a day (BID) | ORAL | Status: DC
  Administered 2023-10-19 – 2023-10-20 (×3): 100 mg via ORAL
  Filled 2023-10-19 (×3): qty 1

## 2023-10-19 MED ORDER — POLYETHYLENE GLYCOL 3350 17 G PO PACK
17.0000 g | PACK | Freq: Every day | ORAL | Status: DC
Start: 2023-10-19 — End: 2023-10-20
  Administered 2023-10-19 – 2023-10-20 (×2): 17 g via ORAL
  Filled 2023-10-19 (×2): qty 1

## 2023-10-19 MED ORDER — BISOPROLOL FUMARATE 5 MG PO TABS
5.0000 mg | ORAL_TABLET | Freq: Every day | ORAL | Status: DC
  Administered 2023-10-19 – 2023-10-20 (×2): 5 mg via ORAL
  Filled 2023-10-19 (×2): qty 1

## 2023-10-19 MED ORDER — LACTULOSE 10 GM/15ML PO SOLN
20.0000 g | Freq: Once | ORAL | Status: AC
Start: 2023-10-19 — End: 2023-10-19
  Administered 2023-10-19: 20 g via ORAL
  Filled 2023-10-19: qty 30

## 2023-10-19 NOTE — Progress Notes (Signed)
 Occupational Therapy Treatment Patient Details Name: Michelle Zavala MRN: [id number] DOB: 1946/01/07 Today's Date: 10/19/2023   History of present illness Pt is a 78 y.o. female presenting 1/31 after fall at home. Found to have acute communicated displaced intertrochanteric fx of thr R femur. Now s/p R intertrochanteric intramedullary nailing 2/1, WBAT. PMH: HTN, CAD, PAD, PSVT, AAA, COPD, GERD.   OT comments  Pt with good progression toward established OT goals. Focus session on functional mobility and return to self care. Pt needing min cues and assist during transfers and toileting this session. Remains max A for LB ADL. Pt to continue to benefit from acute OT. Patient will benefit from continued inpatient follow up therapy, <3 hours/day.       If plan is discharge home, recommend the following:  Assistance with cooking/housework;Assist for transportation;Help with stairs or ramp for entrance;A lot of help with walking and/or transfers;A lot of help with bathing/dressing/bathroom   Equipment Recommendations  Other (comment) (defer)    Recommendations for Other Services      Precautions / Restrictions Precautions Precautions: Fall Precaution Comments: Contact precs; orthostatic hypotension Restrictions Weight Bearing Restrictions Per Provider Order: Yes RLE Weight Bearing Per Provider Order: Weight bearing as tolerated Other Position/Activity Restrictions: bil knee high TED hose in place       Mobility Bed Mobility Overal bed mobility: Needs Assistance Bed Mobility: Sit to Supine       Sit to supine: Min assist   General bed mobility comments: for repositioning once in bed    Transfers Overall transfer level: Needs assistance Equipment used: Rolling walker (2 wheels) Transfers: Sit to/from Stand, Bed to chair/wheelchair/BSC Sit to Stand: Min assist           General transfer comment: up from chair and toilet     Balance Overall balance assessment: Needs  assistance Sitting-balance support: No upper extremity supported, Feet supported, Single extremity supported Sitting balance-Leahy Scale: Good     Standing balance support: Bilateral upper extremity supported, Reliant on assistive device for balance Standing balance-Leahy Scale: Poor Standing balance comment: reliant on RW                           ADL either performed or assessed with clinical judgement   ADL Overall ADL's : Needs assistance/impaired     Grooming: Minimal assistance;Standing               Lower Body Dressing: Maximal assistance;Sit to/from stand Lower Body Dressing Details (indicate cue type and reason): to don socks Toilet Transfer: Contact guard assist;Minimal assistance;Rolling walker (2 wheels);Ambulation;Regular Toilet;Grab bars Toilet Transfer Details (indicate cue type and reason): intermittent min A Toileting- Clothing Manipulation and Hygiene: Minimal assistance;Sit to/from stand Toileting - Clothing Manipulation Details (indicate cue type and reason): for underpants     Functional mobility during ADLs: Contact guard assist;Rolling walker (2 wheels)      Extremity/Trunk Assessment Upper Extremity Assessment Upper Extremity Assessment: Overall WFL for tasks assessed   Lower Extremity Assessment Lower Extremity Assessment: Defer to PT evaluation        Vision       Perception     Praxis      Cognition Arousal: Alert Behavior During Therapy: [facility] for tasks assessed/performed Overall Cognitive Status: Within Functional Limits for tasks assessed  General Comments: hard of hearing; follows basic commands but cognition not formally assessed. WFL for basic mobility assessed.        Exercises      Shoulder Instructions       General Comments See BP in comments above, pt still demos orthostatic hypotension; HR elevated with standing; Pt agreeable to sit up in chair to eat her  lunch at end of session, and PTA assisted her to call for bottled water  as she only had cola in her room.    Pertinent Vitals/ Pain       Pain Assessment Pain Assessment: Faces Faces Pain Scale: Hurts even more Pain Location: R hip Pain Descriptors / Indicators: Discomfort, Sore, Grimacing, Guarding Pain Intervention(s): Limited activity within patient's tolerance, Monitored during session  Home Living                                          Prior Functioning/Environment              Frequency  Min 1X/week        Progress Toward Goals  OT Goals(current goals can now be found in the care plan section)  Progress towards OT goals: Progressing toward goals  Acute Rehab OT Goals Patient Stated Goal: get better OT Goal Formulation: With patient Time For Goal Achievement: 10/30/23 Potential to Achieve Goals: Good ADL Goals Pt Will Perform Grooming: with contact guard assist;standing Pt Will Perform Lower Body Dressing: with contact guard assist;sit to/from stand Pt Will Transfer to Toilet: with contact guard assist;ambulating  Plan      Co-evaluation                 AM-PAC OT 6 Clicks Daily Activity     Outcome Measure   Help from another person eating meals?: None Help from another person taking care of personal grooming?: A Little Help from another person toileting, which includes using toliet, bedpan, or urinal?: A Lot Help from another person bathing (including washing, rinsing, drying)?: A Lot Help from another person to put on and taking off regular upper body clothing?: A Little Help from another person to put on and taking off regular lower body clothing?: A Lot 6 Click Score: 16    End of Session Equipment Utilized During Treatment: Gait belt;Rolling walker (2 wheels)  OT Visit Diagnosis: Unsteadiness on feet (R26.81);Muscle weakness (generalized) (M62.81);Pain;History of falling (Z91.81) Pain - Right/Left: Right Pain - part  of body: Hip   Activity Tolerance Patient tolerated treatment well   Patient Left in bed;with call bell/phone within reach;with bed alarm set   Nurse Communication Mobility status        Time: 8378-8348 OT Time Calculation (min): 30 min  Charges: OT General Charges $OT Visit: 1 Visit OT Treatments $Self Care/Home Management : 23-37 mins  Elma JONETTA Lebron FREDERICK, OTR/L [facility] Acute Rehabilitation Office: [phone]   Elma JONETTA Lebron 10/19/2023, 5:47 PM

## 2023-10-19 NOTE — Plan of Care (Signed)

## 2023-10-19 NOTE — Progress Notes (Signed)
 Mobility Specialist Progress Note:    10/19/23 1100  Mobility  Activity Transferred from bed to chair  Level of Assistance Contact guard assist, steadying assist  Assistive Device Front wheel walker  Distance Ambulated (ft) 6 ft  RLE Weight Bearing Per Provider Order WBAT  Activity Response Tolerated well  Mobility Referral Yes (chair follow rec.)  Mobility visit 1 Mobility  Mobility Specialist Start Time (ACUTE ONLY) 0934  Mobility Specialist Stop Time (ACUTE ONLY) 0957  Mobility Specialist Time Calculation (min) (ACUTE ONLY) 23 min   Pt received in bed, eager for mobility. No complaints throughout and only requiring minG. Pt left in chair with call bell and chair alarm on. RN aware.  D'Vante Nicholaus Mobility Specialist Please contact via Special Educational Needs Teacher or Rehab office at [phone]

## 2023-10-19 NOTE — Progress Notes (Signed)
.  Subjective: 4 Days Post-Op Procedure(s) (LRB): RIGHT INTRAMEDULLARY (IM) NAIL INTERTROCHANTERIC (Right)  Patient still complaining of significant pain in RLE. Difficulty working with PT due to pain and orthostatic hypotension. Being treated for ESBL UTI  Objective: Vital signs in last 24 hours: Temp:  [98 F (36.7 C)-98.5 F (36.9 C)] 98.5 F (36.9 C) (02/05 0810) Pulse Rate:  [82-89] 85 (02/05 0810) Resp:  [14-18] 16 (02/05 0810) BP: (119-151)/(71-84) 131/84 (02/05 0810) SpO2:  [98 %-100 %] 98 % (02/05 0810)  Labs: Recent Labs    10/17/23 0452 10/18/23 0554  HGB 9.0* 8.0*   Recent Labs    10/17/23 0452 10/18/23 0554  WBC 14.7* 10.7*  RBC 3.08* 2.77*  HCT 26.2* 23.9*  PLT 162 183   Recent Labs    10/17/23 0452 10/18/23 0554  NA 133* 136  K 4.5 4.0  CL 104 102  CO2 19* 21*  BUN 46* 39*  CREATININE 1.66* 1.34*  GLUCOSE 110* 106*  CALCIUM  8.5* 8.5*   No results for input(s): LABPT, INR in the last 72 hours.  Physical Exam:  Neurologically intact ABD soft Neurovascular intact Sensation intact distally Intact pulses distally Dorsiflexion/Plantar flexion intact Incision: clean dry and intact, no erythema, scant drainage on proximal dressing. New dressing applied today  Assessment/Plan:  4 Days Post-Op Procedure(s) (LRB): RIGHT INTRAMEDULLARY (IM) NAIL INTERTROCHANTERIC (Right)  WBAT RLE Hgb stabilized after transfusions. Likely due to acute blood loss anemia from fracture and surgery. Received 2u pRBCs. Continue to trend CBC and transfuse as needed Pain control with PRN pain medication preferring oral medicines  ASA 81 mg BID for DVT ppx Recommend dressing changes to lateral leg at skin tear site every other day. Recommend dressing with xeroform or adaptic, gauze and ace wrap Planned for DC  to SNF. Patient can follow up in the office 2 weeks post of for wound check and XR right hip   Michelle Zavala Mon 10/19/2023, 1:34 PM

## 2023-10-19 NOTE — Progress Notes (Signed)
 Physical Therapy Treatment Patient Details Name: Michelle Zavala MRN: [id number] DOB: 06/16/1946 Today's Date: 10/19/2023   History of Present Illness Pt is a 78 y.o. female presenting 1/31 after fall at home. Found to have acute communicated displaced intertrochanteric fx of thr R femur. Now s/p R intertrochanteric intramedullary nailing 2/1, WBAT. PMH: HTN, CAD, PAD, PSVT, AAA, COPD, GERD.    PT Comments  Pt received in supine, agreeable to therapy session and with good participation in transfer and gait training. Pt gait tolerance limited due to evolving symptoms of orthostatic hypotension, including slower processing and c/o weakness and my head does not feel right. Pt needing up to modA for backward stepping with RW support and minA for bed mobility and transfers, with briefs in place due to urinary incontinence. Pt set up to eat lunch in chair with encouragement to drink water  instead of cola for better hydration at end of session. Pt will continue to benefit from skilled rehab in a post acute setting to maximize functional gains before returning home.  Orthostatic Lying  (all taken with knee-high TED hose in place  BP- Lying 135/84  Pulse- Lying 86  Orthostatic Sitting  BP- Sitting 116/69 (sitting EOB feet on floor)  Pulse- Sitting 106  Orthostatic Standing at 0 minutes  BP- Standing at 0 minutes (!) 87/65  Pulse- Standing at 0 minutes 110  Orthostatic Standing at 3 minutes  BP- Standing at 3 minutes  (did not read correctly)  Pulse- Standing at 3 minutes 115     If plan is discharge home, recommend the following: A lot of help with walking and/or transfers;A lot of help with bathing/dressing/bathroom;Help with stairs or ramp for entrance;Assistance with cooking/housework   Can travel by private vehicle     No  Equipment Recommendations  Rolling walker (2 wheels);BSC/3in1    Recommendations for Other Services       Precautions / Restrictions Precautions Precautions:  Fall Precaution Comments: Contact precs; orthostatic hypotension Restrictions Weight Bearing Restrictions Per Provider Order: Yes RLE Weight Bearing Per Provider Order: Weight bearing as tolerated Other Position/Activity Restrictions: bil knee high TED hose in place     Mobility  Bed Mobility Overal bed mobility: Needs Assistance Bed Mobility: Supine to Sit     Supine to sit: Min assist     General bed mobility comments: min A toward her R side, from flat bed, trunk/UE support to raise up when not using rails per home set-up. minA to advance hips to foot flat with bed pad assist.    Transfers Overall transfer level: Needs assistance Equipment used: Rolling walker (2 wheels) Transfers: Sit to/from Stand, Bed to chair/wheelchair/BSC Sit to Stand: Min assist           General transfer comment: from bed>RW and RW>chair, cues for safe UE/LE placement    Ambulation/Gait Ambulation/Gait assistance: Min assist, Mod assist Gait Distance (Feet): 10 Feet Assistive device: Rolling walker (2 wheels) Gait Pattern/deviations: Step-to pattern, Decreased stride length, Decreased weight shift to right, Trunk flexed       General Gait Details: forward ~43ft, backward ~39ft with RW support, pt having difficulty managing RW for backward stepping needing up to modA, minA for forward stepping; briefs donned prior to trial due to urinary incontinence; pt c/o my head doesn't feel right toward end of gait trial and pt was orthostatic, so returned to chair, where BP improved somewhat.   Stairs             Psychologist, [facility]  Tilt Bed    Modified Rankin (Stroke Patients Only)       Balance Overall balance assessment: Needs assistance Sitting-balance support: No upper extremity supported, Feet supported, Single extremity supported Sitting balance-Leahy Scale: Good Sitting balance - Comments: no c/o dizziness; BP is soft sitting EOB but better than on eval pt using BUE for  support   Standing balance support: Bilateral upper extremity supported, Reliant on assistive device for balance Standing balance-Leahy Scale: Poor Standing balance comment: heavy reliance on RW due to RLE pain, very poor with backward stepping                            Cognition Arousal: Alert Behavior During Therapy: WFL for tasks assessed/performed Overall Cognitive Status: Within Functional Limits for tasks assessed                                 General Comments: hard of hearing; follows basic commands but cognition not formally assessed. WFL for basic mobility assessed.        Exercises Other Exercises Other Exercises: supine BLE AROM: ankle pumps x10 reps ea, heel slides x 5 reps ea, hip abduction x5 reps ea    General Comments General comments (skin integrity, edema, etc.): See BP in comments above, pt still demos orthostatic hypotension; HR elevated with standing; Pt agreeable to sit up in chair to eat her lunch at end of session, and PTA assisted her to call for bottled water  as she only had cola in her room.      Pertinent Vitals/Pain Pain Assessment Pain Assessment: Faces Faces Pain Scale: Hurts little more Pain Location: R hip after premedication Pain Descriptors / Indicators: Discomfort, Sore, Grimacing, Guarding Pain Intervention(s): Limited activity within patient's tolerance, Monitored during session, Premedicated before session, Repositioned    Home Living                          Prior Function            PT Goals (current goals can now be found in the care plan section) Acute Rehab PT Goals Patient Stated Goal: be able to get home PT Goal Formulation: With patient Time For Goal Achievement: 10/30/23 Progress towards PT goals: Progressing toward goals    Frequency    Min 1X/week      PT Plan      Co-evaluation              AM-PAC PT 6 Clicks Mobility   Outcome Measure  Help needed turning  from your back to your side while in a flat bed without using bedrails?: A Little Help needed moving from lying on your back to sitting on the side of a flat bed without using bedrails?: A Little Help needed moving to and from a bed to a chair (including a wheelchair)?: A Little Help needed standing up from a chair using your arms (e.g., wheelchair or bedside chair)?: A Little Help needed to walk in hospital room?: Total Help needed climbing 3-5 steps with a railing? : Total 6 Click Score: 14    End of Session Equipment Utilized During Treatment: Gait belt Activity Tolerance: Patient tolerated treatment well;Other (comment);Treatment limited secondary to medical complications (Comment) (still demonstrates orthostatic hypotension) Patient left: with call bell/phone within reach;in chair;with chair alarm set (set up to eat lunch) Nurse Communication: Mobility  status;Precautions;Other (comment) (BP, bed needs to be made (linens were damp), pt asking for ice pack but not until back to bed) PT Visit Diagnosis: Unsteadiness on feet (R26.81);Other abnormalities of gait and mobility (R26.89)     Time: 8661-8596 PT Time Calculation (min) (ACUTE ONLY): 25 min  Charges:    $Gait Training: 8-22 mins $Therapeutic Activity: 8-22 mins PT General Charges $$ ACUTE PT VISIT: 1 Visit                     Audrea Bolte P., PTA Acute Rehabilitation Services Secure Chat Preferred 9a-5:30pm Office: [phone]    Connell HERO [facility] 10/19/2023, 2:23 PM

## 2023-10-19 NOTE — TOC Progression Note (Signed)
 Transition of Care [facility]) - Progression Note    Patient Details  Name: Michelle Zavala MRN: [id number] Date of Birth: 05-Sep-1946  [facility]) CM/SW Contact  Bridget Cordella Simmonds, LCSW Phone Number: 10/19/2023, 3:22 PM  Clinical Narrative:   SNF auth request submitted in navi and approved: [id number], 3 days: 2/6-2/8.    Expected Discharge Plan: Skilled Nursing Facility Barriers to Discharge: Continued Medical Work up, SNF Pending bed offer  Expected Discharge Plan and Services In-house Referral: Clinical Social Work   Post Acute Care Choice: Skilled Nursing Facility Living arrangements for the past 2 months: Single Family Home                                       Social Determinants of Health (SDOH) Interventions SDOH Screenings   Food Insecurity: No Food Insecurity (10/15/2023)  Housing: Low Risk  (10/15/2023)  Transportation Needs: No Transportation Needs (10/15/2023)  Utilities: Not At Risk (10/15/2023)  Alcohol Screen: Low Risk  (04/22/2022)  Depression (PHQ2-9): Medium Risk (05/09/2023)  Financial Resource Strain: Low Risk  (04/22/2022)  Physical Activity: Inactive (05/09/2023)  Social Connections: Socially Isolated (10/15/2023)  Stress: Stress Concern Present (05/09/2023)  Tobacco Use: Low Risk  (10/15/2023)  Health Literacy: Low Risk  (11/27/2020)   Received from [facility], [facility]    Readmission Risk Interventions    06/12/2022    1:18 PM  Readmission Risk Prevention Plan  Transportation Screening Complete  PCP or Specialist Appt within 5-7 Days Not Complete  Home Care Screening Complete  Medication Review (RN CM) Complete

## 2023-10-19 NOTE — Progress Notes (Addendum)
 [facility]                                                                              Michelle Zavala, is a 78 y.o. female, DOB - 12/08/1945, FMW:[id number] Admit date - 10/14/2023    Outpatient Primary MD for the patient is Maree Isles, MD  LOS - 5  days  Chief Complaint  Patient presents with   Fall       Brief summary   Patient is a 78 year old female with HTN, CAD, paroxysmal A-fib, PSVT, COPD, AAA, GERD presented after having a fall at home.  She was in the kitchen attempting to clean when she slipped on the water  on the floor, resulting in the fall.  She hit the right side of her forehead, did not lose consciousness.  After the fall, patient had severe pain in her right hip and was unable to get up. Labs showed WBC 17.3, hemoglobin 9.6, creatinine 1.05 X-ray right shoulder showed no acute abnormal, severe degenerative changes of the glenohumeral joint. X-ray of the right hip showed acute comminuted displaced and impacted intertrochanteric fracture of the right proximal femur. Orthopedics was consulted.   Assessment & Plan    Principal Problem: Acute comminuted, displaced and impacted intertrochanteric fracture of the right proximal femur. -Orthopedics consulted, s/p right hip cephalomedullary nailing surgery, on 10/15/2023 (Dr Sherida) -Hemoglobin 6.9 on 2/2, received 2 units packed RBCs -Pain control, DVT prophylaxis per orthopedics -PT OT evaluation recommended SNF -Follow H&H   Active Problems: Acute blood loss anemia, postoperative on chronic normocytic anemia Folic acid  deficiency - Hb 9.6 on admission, but previously had been 11.1 on 09/19/2023 - folate 4.4.  Hemoglobin 6.9 on 2/2 -Started on folic acid  1 mg daily -Transfused 2 units packed RBCs on 2/2, H&H stable    Fall at home, initial encounter -PT evaluation recommended SNF  ESBL E. coli UTI -Patient was placed on IV Rocephin  however urine culture showed 30,000 colonies of ESBL E. coli  - d/w  pharmacist, placed on IV meropenem  for 5 days, day # 3  Chronic systolic CHF, HFrEF -Currently euvolemic.  2D echo 08/2021 had shown EF of 35 to 40% with G1 DD - follow renal function today, if creatinine improving, will resume Lasix  -Follows Dr Debera outpatient  Acute kidney injury -Baseline creatinine 0.7-0.9 -Presented with creatinine of 1.0, trended up to 1.6 on 2/3 -Lasix , Aldactone  held -Patient received 2 units packed RBCs on 2/2 -Creatinine improving, follow renal panel today  Essential hypertension -Cardiology visit note from 08/10/2023 reviewed, patient had been recommended to start bisoprolol  5 mg daily, Aldactone  25 mg daily, furosemide  20 mg every other day, K 20 mEq every other day -Patient had been recommended by cardiology to stop Toprol -XL, chlorthalidone -Currently holding Lasix , Aldactone  - will start on bisoprolol  today  Orthostatic -Continue TED hose, no dizziness today   COPD -No wheezing, continue DuoNebs as needed   Memory impairment -outpatient follows neurology.  Last seen on 09/27/2023, she is not taking Namenda  -Continue delirium precautions.     Anxiety and depression -Continue amitriptyline , Zoloft , and Ativan    Nonobstructive CAD Mixed hyperlipidemia -  Last cardiac  catheterization 09/2021 demonstrated mild to moderate nonobstructive coronary disease.  - Last LDL 177, but patient has statin induced myalgias.   Ascending AAA Patient has a 4.2 cm AAA seen on previous CTA of the chest in 09/2022. -Continue outpatient screening   GERD -Continue Protonix   Estimated body mass index is 25.7 kg/m as calculated from the following:   Height as of this encounter: 5' 2.99 (1.6 m).   Weight as of this encounter: 65.8 kg.  Code Status: Full code DVT Prophylaxis:  Place TED hose Start: 10/18/23 1011 enoxaparin  (LOVENOX ) injection 30 mg Start: 10/17/23 2200   Level of Care: Level of care: Telemetry Surgical Family Communication: Updated  patient Disposition Plan:      Remains inpatient appropriate:   Hopefully DC to SNF tomorrow   Procedures:  10/15/2023 right hip cephalomedullary nailing   Consultants:   Orthopedics  Antimicrobials:   Anti-infectives (From admission, onward)    Start     Dose/Rate Route Frequency Ordered Stop   10/17/23 1500  meropenem  (MERREM ) 1 g in sodium chloride  0.9 % 100 mL IVPB        1 g 200 mL/hr over 30 Minutes Intravenous Every 12 hours 10/17/23 1407 10/22/23 0959   10/17/23 1400  meropenem  (MERREM ) 1 g in sodium chloride  0.9 % 100 mL IVPB  Status:  Discontinued        1 g 200 mL/hr over 30 Minutes Intravenous Every 8 hours 10/17/23 1201 10/17/23 1407   10/15/23 1800  cefTRIAXone  (ROCEPHIN ) 1 g in sodium chloride  0.9 % 100 mL IVPB  Status:  Discontinued        1 g 200 mL/hr over 30 Minutes Intravenous Every 24 hours 10/15/23 0608 10/17/23 1201   10/15/23 1400  ceFAZolin  (ANCEF ) IVPB 1 g/50 mL premix        1 g 100 mL/hr over 30 Minutes Intravenous Every 8 hours 10/15/23 0955 10/16/23 0508   10/15/23 0652  ceFAZolin  (ANCEF ) 2-4 GM/100ML-% IVPB       Note to Pharmacy: Michelle Zavala D: cabinet override      10/15/23 0652 10/15/23 0758   10/15/23 0630  ceFAZolin  (ANCEF ) IVPB 2g/100 mL premix        2 g 200 mL/hr over 30 Minutes Intravenous On call to O.R. 10/15/23 0543 10/15/23 0745          Medications  amitriptyline   75 mg Oral QHS   docusate sodium   100 mg Oral BID   enoxaparin  (LOVENOX ) injection  30 mg Subcutaneous Q24H   folic acid   1 mg Oral Daily   LORazepam   1 mg Oral BID   memantine   5 mg Oral QHS   pantoprazole   40 mg Oral QHS   polyethylene glycol  17 g Oral Daily   sertraline   100 mg Oral QHS      Subjective:   Michelle Zavala was seen and examined today.  No complaints today.  Pain is controlled.  No chest pain, shortness of breath, nausea vomiting, abdominal pain or fevers.   Objective:   Vitals:   10/18/23 1420 10/18/23 1936 10/19/23 0412 10/19/23 0810   BP: 128/71 (!) 151/77 119/80 131/84  Pulse: 89 82 88 85  Resp:  18 14 16   Temp:  98 F (36.7 C) 98.1 F (36.7 C) 98.5 F (36.9 C)  TempSrc:  Oral Oral   SpO2: 98% 100% 100% 98%  Weight:      Height:        Intake/Output Summary (Last 24  hours) at 10/19/2023 1144 Last data filed at 10/19/2023 0400 Gross per 24 hour  Intake 200 ml  Output 700 ml  Net -500 ml     Wt Readings from Last 3 Encounters:  10/15/23 65.8 kg  09/27/23 65.8 kg  09/19/23 66.7 kg    Physical Exam General: Alert and oriented x 3, NAD Cardiovascular: S1 S2 clear, RRR.  Respiratory: CTAB, no wheezing Gastrointestinal: Soft, nontender, nondistended, NBS Ext: no pedal edema bilaterally Neuro: no new deficits Psych: Normal affect    Data Reviewed:  I have personally reviewed following labs    CBC Lab Results  Component Value Date   WBC 10.7 (H) 10/18/2023   RBC 2.77 (L) 10/18/2023   HGB 8.0 (L) 10/18/2023   HCT 23.9 (L) 10/18/2023   MCV 86.3 10/18/2023   MCH 28.9 10/18/2023   PLT 183 10/18/2023   MCHC 33.5 10/18/2023   RDW 15.0 10/18/2023   LYMPHSABS 0.9 10/14/2023   MONOABS 1.0 10/14/2023   EOSABS 0.0 10/14/2023   BASOSABS 0.1 10/14/2023     Last metabolic panel Lab Results  Component Value Date   NA 136 10/18/2023   K 4.0 10/18/2023   CL 102 10/18/2023   CO2 21 (L) 10/18/2023   BUN 39 (H) 10/18/2023   CREATININE 1.34 (H) 10/18/2023   GLUCOSE 106 (H) 10/18/2023   GFRNONAA 41 (L) 10/18/2023   GFRAA 57 (L) 07/30/2020   CALCIUM  8.5 (L) 10/18/2023   PROT 6.8 10/18/2022   ALBUMIN 4.0 10/18/2022   BILITOT 0.4 10/18/2022   ALKPHOS 56 10/18/2022   AST 24 10/18/2022   ALT 15 10/18/2022   ANIONGAP 13 10/18/2023    CBG (last 3)  No results for input(s): GLUCAP in the last 72 hours.    Coagulation Profile: No results for input(s): INR, PROTIME in the last 168 hours.   Radiology Studies: I have personally reviewed the imaging studies  No results found.      Nydia Distance M.D. [facility] 10/19/2023, 11:44 AM  Available via Epic secure chat 7am-7pm After 7 pm, please refer to night coverage provider listed on amion.

## 2023-10-20 DIAGNOSIS — D692 Other nonthrombocytopenic purpura: Secondary | ICD-10-CM | POA: Diagnosis not present

## 2023-10-20 DIAGNOSIS — S72141D Displaced intertrochanteric fracture of right femur, subsequent encounter for closed fracture with routine healing: Secondary | ICD-10-CM | POA: Diagnosis not present

## 2023-10-20 DIAGNOSIS — Z743 Need for continuous supervision: Secondary | ICD-10-CM | POA: Diagnosis not present

## 2023-10-20 DIAGNOSIS — D500 Iron deficiency anemia secondary to blood loss (chronic): Secondary | ICD-10-CM | POA: Diagnosis not present

## 2023-10-20 DIAGNOSIS — K219 Gastro-esophageal reflux disease without esophagitis: Secondary | ICD-10-CM | POA: Diagnosis not present

## 2023-10-20 DIAGNOSIS — Y92009 Unspecified place in unspecified non-institutional (private) residence as the place of occurrence of the external cause: Secondary | ICD-10-CM | POA: Diagnosis not present

## 2023-10-20 DIAGNOSIS — E782 Mixed hyperlipidemia: Secondary | ICD-10-CM | POA: Diagnosis not present

## 2023-10-20 DIAGNOSIS — Z1612 Extended spectrum beta lactamase (ESBL) resistance: Secondary | ICD-10-CM | POA: Diagnosis not present

## 2023-10-20 DIAGNOSIS — I480 Paroxysmal atrial fibrillation: Secondary | ICD-10-CM | POA: Diagnosis not present

## 2023-10-20 DIAGNOSIS — A499 Bacterial infection, unspecified: Secondary | ICD-10-CM | POA: Diagnosis not present

## 2023-10-20 DIAGNOSIS — I429 Cardiomyopathy, unspecified: Secondary | ICD-10-CM | POA: Diagnosis not present

## 2023-10-20 DIAGNOSIS — I110 Hypertensive heart disease with heart failure: Secondary | ICD-10-CM | POA: Diagnosis not present

## 2023-10-20 DIAGNOSIS — D62 Acute posthemorrhagic anemia: Secondary | ICD-10-CM | POA: Diagnosis not present

## 2023-10-20 DIAGNOSIS — S72001A Fracture of unspecified part of neck of right femur, initial encounter for closed fracture: Secondary | ICD-10-CM | POA: Diagnosis not present

## 2023-10-20 DIAGNOSIS — J449 Chronic obstructive pulmonary disease, unspecified: Secondary | ICD-10-CM | POA: Diagnosis not present

## 2023-10-20 DIAGNOSIS — M542 Cervicalgia: Secondary | ICD-10-CM | POA: Diagnosis not present

## 2023-10-20 DIAGNOSIS — N390 Urinary tract infection, site not specified: Secondary | ICD-10-CM | POA: Diagnosis not present

## 2023-10-20 DIAGNOSIS — D520 Dietary folate deficiency anemia: Secondary | ICD-10-CM | POA: Diagnosis not present

## 2023-10-20 DIAGNOSIS — W19XXXA Unspecified fall, initial encounter: Secondary | ICD-10-CM | POA: Diagnosis not present

## 2023-10-20 DIAGNOSIS — I10 Essential (primary) hypertension: Secondary | ICD-10-CM | POA: Diagnosis not present

## 2023-10-20 DIAGNOSIS — Z9181 History of falling: Secondary | ICD-10-CM | POA: Diagnosis not present

## 2023-10-20 DIAGNOSIS — S72141S Displaced intertrochanteric fracture of right femur, sequela: Secondary | ICD-10-CM | POA: Diagnosis not present

## 2023-10-20 DIAGNOSIS — R531 Weakness: Secondary | ICD-10-CM | POA: Diagnosis not present

## 2023-10-20 DIAGNOSIS — R413 Other amnesia: Secondary | ICD-10-CM | POA: Diagnosis not present

## 2023-10-20 DIAGNOSIS — I5020 Unspecified systolic (congestive) heart failure: Secondary | ICD-10-CM | POA: Diagnosis not present

## 2023-10-20 DIAGNOSIS — D72823 Leukemoid reaction: Secondary | ICD-10-CM | POA: Diagnosis not present

## 2023-10-20 DIAGNOSIS — I714 Abdominal aortic aneurysm, without rupture, unspecified: Secondary | ICD-10-CM | POA: Diagnosis not present

## 2023-10-20 DIAGNOSIS — I2510 Atherosclerotic heart disease of native coronary artery without angina pectoris: Secondary | ICD-10-CM | POA: Diagnosis not present

## 2023-10-20 DIAGNOSIS — D649 Anemia, unspecified: Secondary | ICD-10-CM | POA: Diagnosis not present

## 2023-10-20 DIAGNOSIS — M1990 Unspecified osteoarthritis, unspecified site: Secondary | ICD-10-CM | POA: Diagnosis not present

## 2023-10-20 LAB — BASIC METABOLIC PANEL WITH GFR
Anion gap: 10 (ref 5–15)
BUN: 25 mg/dL — ABNORMAL HIGH (ref 8–23)
CO2: 22 mmol/L (ref 22–32)
Calcium: 8.5 mg/dL — ABNORMAL LOW (ref 8.9–10.3)
Chloride: 102 mmol/L (ref 98–111)
Creatinine, Ser: 0.98 mg/dL (ref 0.44–1.00)
GFR, Estimated: 59 mL/min — ABNORMAL LOW
Glucose, Bld: 103 mg/dL — ABNORMAL HIGH (ref 70–99)
Potassium: 4.1 mmol/L (ref 3.5–5.1)
Sodium: 134 mmol/L — ABNORMAL LOW (ref 135–145)

## 2023-10-20 LAB — CBC
HCT: 25.2 % — ABNORMAL LOW (ref 36.0–46.0)
Hemoglobin: 8.3 g/dL — ABNORMAL LOW (ref 12.0–15.0)
MCH: 28.8 pg (ref 26.0–34.0)
MCHC: 32.9 g/dL (ref 30.0–36.0)
MCV: 87.5 fL (ref 80.0–100.0)
Platelets: 239 K/uL (ref 150–400)
RBC: 2.88 MIL/uL — ABNORMAL LOW (ref 3.87–5.11)
RDW: 14.9 % (ref 11.5–15.5)
WBC: 10.2 K/uL (ref 4.0–10.5)
nRBC: 0 % (ref 0.0–0.2)

## 2023-10-20 MED ORDER — FOLIC ACID 1 MG PO TABS: 1.0000 mg | ORAL_TABLET | Freq: Every day | ORAL | Status: DC

## 2023-10-20 MED ORDER — BISOPROLOL FUMARATE 5 MG PO TABS: 5.0000 mg | ORAL_TABLET | Freq: Every day | ORAL | Status: DC

## 2023-10-20 MED ORDER — HYDROCODONE-ACETAMINOPHEN 5-325 MG PO TABS: 1.0000 | ORAL_TABLET | Freq: Four times a day (QID) | ORAL | 0 refills | Status: DC | PRN

## 2023-10-20 MED ORDER — ACETAMINOPHEN ER 650 MG PO TBCR: 650.0000 mg | EXTENDED_RELEASE_TABLET | Freq: Three times a day (TID) | ORAL | Status: DC | PRN

## 2023-10-20 MED ORDER — ASPIRIN 81 MG PO TBEC: 81.0000 mg | DELAYED_RELEASE_TABLET | Freq: Two times a day (BID) | ORAL | Status: AC

## 2023-10-20 MED ORDER — LORAZEPAM 1 MG PO TABS: 1.0000 mg | ORAL_TABLET | Freq: Two times a day (BID) | ORAL | 0 refills | Status: DC

## 2023-10-20 NOTE — Progress Notes (Signed)
 Report called to [facility], RN from  [employer].

## 2023-10-20 NOTE — Progress Notes (Signed)
 Mobility Specialist Progress Note:    10/20/23 0900  Orthostatic Lying   BP- Lying 108/60  Orthostatic Sitting  BP- Sitting (!) 73/54  Mobility  Activity Dangled on edge of bed;Turned to back - supine  Level of Assistance Contact guard assist, steadying assist  Assistive Device Other (Comment) (HHA + bed rails)  RLE Weight Bearing Per Provider Order WBAT  Activity Response Tolerated well  Mobility Referral Yes  Mobility visit 1 Mobility  Mobility Specialist Start Time (ACUTE ONLY) 0935  Mobility Specialist Stop Time (ACUTE ONLY) 0943  Mobility Specialist Time Calculation (min) (ACUTE ONLY) 8 min   Pt received in bed and agreeable. Orthostatic BP taken, see above. Able to come EOB w/ minG. C/o fatigue and declining sitting in chair. Able to lateral scoot up to head of bed w/ supervision. Pt left in bed with call bell and all needs met.  D'Vante Nicholaus Mobility Specialist Please contact via Special Educational Needs Teacher or Rehab office at [phone]

## 2023-10-20 NOTE — Discharge Summary (Signed)
 Physician Discharge Summary   Patient: Michelle Zavala MRN: [id number] DOB: [birth year]  Admit date:     10/14/2023  Discharge date: 10/20/23  Discharge Physician: Nydia Distance, MD    PCP: Maree Isles, MD   Recommendations at discharge:   Per orthopedics, weightbearing as tolerated RLE Aspirin  81 mg twice daily for DVT prophylaxis Outpatient follow-up with orthopedic surgery, Dr. Adine Mon in 2 weeks  Discharge Diagnoses:   Acute comminuted, displaced and impacted intertrochanteric fracture of the right proximal femur s/pright hip cephalomedullary nailing Mechanical fall at home Acute blood loss anemia, postoperative on chronic normocytic anemia Folic acid  deficiency ESBL E. coli UTI Chronic systolic Heart failure with reduced ejection fraction (HCC)   Essential hypertension, benign   COPD (chronic obstructive pulmonary disease) (HCC)   Normocytic anemia   Memory impairment   Anxiety and depression   Mixed hyperlipidemia   CORONARY ATHEROSCLEROSIS NATIVE CORONARY ARTERY   AAA (abdominal aortic aneurysm) (HCC)   GERD (gastroesophageal reflux disease)     Hospital Course:  Patient is a 78 year old female with HTN, CAD, paroxysmal A-fib, PSVT, COPD, AAA, GERD presented after having a fall at home.  She was in the kitchen attempting to clean when she slipped on the water  on the floor, resulting in the fall.  She hit the right side of her forehead, did not lose consciousness.  After the fall, patient had severe pain in her right hip and was unable to get up. Labs showed WBC 17.3, hemoglobin 9.6, creatinine 1.05 X-ray right shoulder showed no acute abnormal, severe degenerative changes of the glenohumeral joint. X-ray of the right hip showed acute comminuted displaced and impacted intertrochanteric fracture of the right proximal femur. Orthopedics was consulted.  Assessment and Plan:   Acute comminuted, displaced and impacted intertrochanteric fracture of the right proximal  femur. -Orthopedics consulted, s/p right hip cephalomedullary nailing surgery, on 10/15/2023 (Dr Mon) -Hemoglobin 6.9 on 2/2, received 2 units packed RBCs -Hemoglobin stable, 8.3 at discharge. -Per orthopedics, weightbearing as tolerated RLE -Aspirin  81 mg twice daily for DVT prophylaxis.  Continue TED hoses to knees - Outpatient follow-up with orthopedics in 2 weeks. -PT OT evaluation recommended SNF       Acute blood loss anemia, postoperative on chronic normocytic anemia Folic acid  deficiency - Hb 9.6 on admission, but previously had been 11.1 on 09/19/2023 - folate 4.4.  Hemoglobin 6.9 on 2/2 -Started on folic acid  1 mg daily -Transfused 2 units packed RBCs on 2/2, H&H stable     Fall at home, initial encounter -PT evaluation recommended SNF   ESBL E. coli UTI -Patient was placed on IV Rocephin  however urine culture showed 30,000 colonies of ESBL E. coli  -Patient was placed on IV meropenem , received 4 days.  Does not have any dysuria, hematuria, fevers or leukocytosis.   Chronic systolic CHF, HFrEF -Currently euvolemic.  2D echo 08/2021 had shown EF of 35 to 40% with G1 DD -Lasix  resumed.  Creatinine 0.98 -Follows Dr Debera outpatient -If BP allows, creatinine stable, patient may resume spironolactone  outpatient   Acute kidney injury -Baseline creatinine 0.7-0.9 -Presented with creatinine of 1.0, trended up to 1.6 on 2/3 -Lasix , Aldactone  were held held -Patient received 2 units packed RBCs on 2/2 -Creatinine improved, 0.98 at discharge, at baseline   Essential hypertension -Cardiology visit note from 08/10/2023 reviewed, patient had been recommended to start bisoprolol  5 mg daily, Aldactone  25 mg daily, furosemide  20 mg every other day, K 20 mEq every other day -Patient had been recommended  by cardiology to stop Toprol -XL, chlorthalidone -Patient started on bisoprolol  5 mg daily, resume Lasix . -May resume Aldactone  if BP and creatinine function allows.    Orthostatic -Continue TED hose, no dizziness    COPD -No wheezing, continue DuoNebs as needed    Memory impairment -outpatient follows neurology.  Last seen on 09/27/2023, she is not taking Namenda    Anxiety and depression -Continue amitriptyline , Zoloft , and Ativan    Nonobstructive CAD Mixed hyperlipidemia -  Last cardiac catheterization 09/2021 demonstrated mild to moderate nonobstructive coronary disease.  - Last LDL 177, but patient has statin induced myalgias.   Ascending AAA Patient has a 4.2 cm AAA seen on previous CTA of the chest in 09/2022. -Continue outpatient screening   GERD -Continue Protonix    Estimated body mass index is 25.7 kg/m as calculated from the following:   Height as of this encounter: 5' 2.99 (1.6 m).   Weight as of this encounter: 65.8 kg.         Pain control - [place]  Controlled Substance Reporting System database was reviewed. and patient was instructed, not to drive, operate heavy machinery, perform activities at heights, swimming or participation in water  activities or provide baby-sitting services while on Pain, Sleep and Anxiety Medications; until their outpatient Physician has advised to do so again. Also recommended to not to take more than prescribed Pain, Sleep and Anxiety Medications.  Consultants: Orthopedics Procedures performed:  s/p right hip cephalomedullary nailing surgery, on 10/15/2023 (Dr Sherida  Disposition: Skilled nursing facility Diet recommendation:  Discharge Diet Orders (From admission, onward)     Start     Ordered   10/20/23 0000  Diet - low sodium heart healthy        10/20/23 0855            DISCHARGE MEDICATION: Allergies as of 10/20/2023       Reactions   Sulfonamide Derivatives Hives   Atorvastatin     Myalgias   Crestor  [rosuvastatin ]    Myalgias   Entresto  [sacubitril-valsartan]    Dizziness   Farxiga  [dapagliflozin ]    Weakness        Medication List     PAUSE taking these  medications    spironolactone  25 MG tablet Wait to take this until: October 24, 2023 Morning Commonly known as: ALDACTONE  Take 1 tablet (25 mg total) by mouth daily.       STOP taking these medications    hydrochlorothiazide  25 MG tablet Commonly known as: HYDRODIURIL    ipratropium 0.02 % nebulizer solution Commonly known as: ATROVENT    metoprolol  succinate 50 MG 24 hr tablet Commonly known as: TOPROL -XL   naproxen  250 MG tablet Commonly known as: NAPROSYN        TAKE these medications    acetaminophen  650 MG CR tablet Commonly known as: TYLENOL  Take 1 tablet (650 mg total) by mouth every 8 (eight) hours as needed for pain or fever (mild pain). What changed:  how much to take reasons to take this   albuterol  108 (90 Base) MCG/ACT inhaler Commonly known as: VENTOLIN  HFA Inhale 2 puffs into the lungs every 4 (four) hours as needed for wheezing or shortness of breath.   amitriptyline  75 MG tablet Commonly known as: ELAVIL  Take 75 mg by mouth at bedtime.   aspirin  EC 81 MG tablet Take 1 tablet (81 mg total) by mouth 2 (two) times daily. X 1 month.  Swallow whole.   bisoprolol  5 MG tablet Commonly known as: ZEBETA  Take 1 tablet (5 mg total)  by mouth daily.   folic acid  1 MG tablet Commonly known as: FOLVITE  Take 1 tablet (1 mg total) by mouth daily. Start taking on: October 21, 2023   furosemide  20 MG tablet Commonly known as: LASIX  Take 1 tablet (20 mg total) by mouth every other day.   HYDROcodone -acetaminophen  5-325 MG tablet Commonly known as: NORCO/VICODIN Take 1-2 tablets by mouth every 6 (six) hours as needed for moderate pain (pain score 4-6) or severe pain (pain score 7-10).   Linzess  145 MCG Caps capsule Generic drug: linaclotide  TAKE ONE CAPSULE BY MOUTH DAILY BEFORE BREAKFAST What changed: See the new instructions.   LORazepam  1 MG tablet Commonly known as: ATIVAN  Take 1 tablet (1 mg total) by mouth 2 (two) times daily.   ondansetron  4  MG tablet Commonly known as: ZOFRAN  Take 4 mg by mouth every 8 (eight) hours as needed for nausea.   pantoprazole  40 MG tablet Commonly known as: PROTONIX  Take 40 mg by mouth at bedtime.   potassium chloride  SA 20 MEQ tablet Commonly known as: KLOR-CON  M Take 1 tablet (20 mEq total) by mouth every other day.   sertraline  100 MG tablet Commonly known as: ZOLOFT  Take 100 mg by mouth at bedtime.               Discharge Care Instructions  (From admission, onward)           Start     Ordered   10/20/23 0000  If the dressing is still on your incision site when you go home, remove it on the third day after your surgery date. Remove dressing if it begins to fall off, or if it is dirty or damaged before the third day.        10/20/23 0855            Follow-up Information     Sherida Adine BROCKS, MD. Schedule an appointment as soon as possible for a visit in 2 week(s).   Specialty: Orthopedic Surgery Why: for hospital follow-up Contact information: [address] [place] [address] [phone]         Maree Isles, MD. Schedule an appointment as soon as possible for a visit in 2 week(s).   Specialty: Internal Medicine Why: for hospital follow-up Contact information: [address] [place] [address] [phone]                Discharge Exam: Fredricka Weights   10/14/23 1138 10/15/23 0648  Weight: 65.8 kg 65.8 kg   S; no acute complaints, pain is controlled.  No fevers, dysuria or hematuria.   BP 108/67 (BP Location: Left Arm)   Pulse 80   Temp 98 F (36.7 C) (Oral)   Resp 18   Ht 5' 2.99 (1.6 m)   Wt 65.8 kg   SpO2 98%   BMI 25.70 kg/m   Physical Exam General: Alert and oriented x 3, NAD Cardiovascular: S1 S2 clear, RRR.  Respiratory: CTAB, no wheezing, rales or rhonchi Gastrointestinal: Soft, nontender, nondistended, NBS Ext: no pedal edema bilaterally Neuro: no new deficits Psych: Normal affect    Condition at discharge:  fair  The results of significant diagnostics from this hospitalization (including imaging, microbiology, ancillary and laboratory) are listed below for reference.   Imaging Studies: DG HIP UNILAT WITH PELVIS 2-3 VIEWS RIGHT Result Date: 10/15/2023 CLINICAL DATA:  Right femoral nail placement. EXAM: DG HIP (WITH OR WITHOUT PELVIS) 2-3V RIGHT COMPARISON:  10/14/2023. FINDINGS: 3 intraoperative fluoroscopic spot views of the  right hip show placement of a short intramedullary nail in the proximal right femur with 1 distal interlocking screw and 2 dynamic hip screws across a previously seen intertrochanteric fracture. IMPRESSION: Intraoperative visualization for ORIF of a right intertrochanteric femur fracture. Electronically Signed   By: Newell Eke M.D.   On: 10/15/2023 12:47   DG C-Arm 1-60 Min-No Report Result Date: 10/15/2023 Fluoroscopy was utilized by the requesting physician.  No radiographic interpretation.   DG Pelvis 1-2 Views Result Date: 10/14/2023 CLINICAL DATA:  Hip fracture.  View in traction EXAM: Right hip 2 VIEW COMPARISON:  Pelvis x-ray 10/14/2023 FINDINGS: Comminuted angulated fracture of the intertrochanteric region of the right hip. Level of foreshortening/overriding and angulation is slightly improved compared to previous x-ray. Underlying osteopenia. Preserved joint spaces. IMPRESSION: Slight improvement in alignment with significant residual angulation and foreshortening of the intertrochanteric hip fracture. Electronically Signed   By: Ranell Bring M.D.   On: 10/14/2023 16:49   CT Head Wo Contrast Result Date: 10/14/2023 CLINICAL DATA:  Fall, with small hematoma and laceration to right forehead EXAM: CT HEAD WITHOUT CONTRAST CT CERVICAL SPINE WITHOUT CONTRAST TECHNIQUE: Multidetector CT imaging of the head and cervical spine was performed following the standard protocol without intravenous contrast. Multiplanar CT image reconstructions of the cervical spine were also generated.  RADIATION DOSE REDUCTION: This exam was performed according to the departmental dose-optimization program which includes automated exposure control, adjustment of the mA and/or kV according to patient size and/or use of iterative reconstruction technique. COMPARISON:  10/18/2022 CT head and cervical spine FINDINGS: CT HEAD FINDINGS Brain: No evidence of acute infarct, hemorrhage, mass, mass effect, or midline shift. No hydrocephalus or extra-axial fluid collection. Periventricular white matter changes, likely the sequela of chronic small vessel ischemic disease. Vascular: No hyperdense vessel. Atherosclerotic calcifications in the intracranial carotid and vertebral arteries. Skull: Negative for fracture or focal lesion. Small bilateral forehead hematomas with right forehead laceration. Sinuses/Orbits: Mild mucosal thickening in the ethmoid air cells. No acute finding in the orbits. Other: The mastoid air cells are well aerated. CT CERVICAL SPINE FINDINGS Alignment: No traumatic listhesis. Skull base and vertebrae: No acute fracture or suspicious osseous lesion. ACDF C5-C7. Additional osseous fusion of C3-C5. C6-C7 spinous process cerclage. Soft tissues and spinal canal: No prevertebral fluid or swelling. No visible canal hematoma. Disc levels: Degenerative changes in the cervical spine.No high-grade spinal canal stenosis. Upper chest: No focal pulmonary opacity or pleural effusion. IMPRESSION: 1. No acute intracranial process. 2. No acute fracture or traumatic listhesis in the cervical spine. 3. Small bilateral forehead hematomas with right forehead laceration. Electronically Signed   By: Donald Campion M.D.   On: 10/14/2023 13:23   CT Cervical Spine Wo Contrast Result Date: 10/14/2023 CLINICAL DATA:  Fall, with small hematoma and laceration to right forehead EXAM: CT HEAD WITHOUT CONTRAST CT CERVICAL SPINE WITHOUT CONTRAST TECHNIQUE: Multidetector CT imaging of the head and cervical spine was performed following  the standard protocol without intravenous contrast. Multiplanar CT image reconstructions of the cervical spine were also generated. RADIATION DOSE REDUCTION: This exam was performed according to the departmental dose-optimization program which includes automated exposure control, adjustment of the mA and/or kV according to patient size and/or use of iterative reconstruction technique. COMPARISON:  10/18/2022 CT head and cervical spine FINDINGS: CT HEAD FINDINGS Brain: No evidence of acute infarct, hemorrhage, mass, mass effect, or midline shift. No hydrocephalus or extra-axial fluid collection. Periventricular white matter changes, likely the sequela of chronic small vessel ischemic disease.  Vascular: No hyperdense vessel. Atherosclerotic calcifications in the intracranial carotid and vertebral arteries. Skull: Negative for fracture or focal lesion. Small bilateral forehead hematomas with right forehead laceration. Sinuses/Orbits: Mild mucosal thickening in the ethmoid air cells. No acute finding in the orbits. Other: The mastoid air cells are well aerated. CT CERVICAL SPINE FINDINGS Alignment: No traumatic listhesis. Skull base and vertebrae: No acute fracture or suspicious osseous lesion. ACDF C5-C7. Additional osseous fusion of C3-C5. C6-C7 spinous process cerclage. Soft tissues and spinal canal: No prevertebral fluid or swelling. No visible canal hematoma. Disc levels: Degenerative changes in the cervical spine.No high-grade spinal canal stenosis. Upper chest: No focal pulmonary opacity or pleural effusion. IMPRESSION: 1. No acute intracranial process. 2. No acute fracture or traumatic listhesis in the cervical spine. 3. Small bilateral forehead hematomas with right forehead laceration. Electronically Signed   By: Donald Campion M.D.   On: 10/14/2023 13:23   DG Chest Port 1 View Result Date: 10/14/2023 CLINICAL DATA:  Fall. EXAM: PORTABLE CHEST 1 VIEW COMPARISON:  Chest radiograph dated 09/19/2023. FINDINGS:  The heart size and mediastinal contours are within normal limits. Aortic atherosclerosis. No focal consolidation, sizeable pleural effusion, or pneumothorax. Severe degenerative changes of the right glenohumeral joint. Partially visualized postoperative changes of the cervical spine. No acute osseous abnormality identified. IMPRESSION: No acute findings in the chest. Electronically Signed   By: Harrietta Sherry M.D.   On: 10/14/2023 12:26   DG Shoulder Right Port Result Date: 10/14/2023 CLINICAL DATA:  Shoulder pain status post fall. EXAM: RIGHT SHOULDER - 1 VIEW COMPARISON:  10/10/2023. FINDINGS: There is no evidence of acute fracture or dislocation. Severe degenerative changes of the right glenohumeral joint are again noted with severe joint space narrowing, marginal osteophytosis, and subchondral sclerosis. Similar high-riding humeral head, likely relates to chronic rotator cuff pathology. Moderate degenerative changes of the acromioclavicular joint. Postoperative changes of the cervical spine. IMPRESSION: 1. No acute osseous abnormality. 2. Severe degenerative changes of the glenohumeral joint. 3. Similar high-riding humeral head, likely relates to chronic rotator cuff pathology. 4. Moderate degenerative changes of the acromioclavicular joint. Electronically Signed   By: Harrietta Sherry M.D.   On: 10/14/2023 12:22   DG Hip [place] or [place] Pelvis 1 View Right Result Date: 10/14/2023 CLINICAL DATA:  Hip pain status post fall. EXAM: DG HIP (WITH OR WITHOUT PELVIS) 1V PORT RIGHT COMPARISON:  09/19/2023. FINDINGS: Diffuse osseous demineralization. There is an acute comminuted and impacted intertrochanteric fracture of the right proximal femur. There is approximately 5.5 cm of superior displacement of the distal fracture component with apex varus angulation. The right femoral head is seated within the acetabulum. Status post ORIF of the left proximal femur. The sacroiliac joints and pubic symphysis are  anatomically aligned. Degenerative changes of the visualized lower lumbar spine. IMPRESSION: Acute comminuted, displaced and impacted intertrochanteric fracture of the right proximal femur. Electronically Signed   By: Harrietta Sherry M.D.   On: 10/14/2023 12:18    Microbiology: Results for orders placed or performed during the hospital encounter of 10/14/23  Urine Culture     Status: Abnormal   Collection Time: 10/14/23  4:48 AM   Specimen: Urine, Random  Result Value Ref Range Status   Specimen Description URINE, RANDOM  Final   Special Requests   Final    NONE Reflexed from [id number] Performed at [facility], [address], [place], [place] [address]    Culture (A)  Final    30,000 COLONIES/mL ESCHERICHIA  COLI Confirmed Extended Spectrum Beta-Lactamase Producer (ESBL).  In bloodstream infections from ESBL organisms, carbapenems are preferred over piperacillin/tazobactam. They are shown to have a lower risk of mortality.    Report Status 10/17/2023 FINAL  Final   Organism ID, Bacteria ESCHERICHIA COLI (A)  Final      Susceptibility   Escherichia coli - MIC*    AMPICILLIN >=32 RESISTANT Resistant     CEFAZOLIN  >=64 RESISTANT Resistant     CEFEPIME 16 RESISTANT Resistant     CEFTRIAXONE  >=64 RESISTANT Resistant     CIPROFLOXACIN  >=4 RESISTANT Resistant     GENTAMICIN <=1 SENSITIVE Sensitive     IMIPENEM <=0.25 SENSITIVE Sensitive     NITROFURANTOIN  <=16 SENSITIVE Sensitive     TRIMETH/SULFA <=20 SENSITIVE Sensitive     AMPICILLIN/SULBACTAM >=32 RESISTANT Resistant     PIP/TAZO <=4 SENSITIVE Sensitive ug/mL    * 30,000 COLONIES/mL ESCHERICHIA COLI  Surgical pcr screen     Status: None   Collection Time: 10/14/23  4:17 PM   Specimen: Nasal Mucosa; Nasal Swab  Result Value Ref Range Status   MRSA, PCR NEGATIVE NEGATIVE Final   Staphylococcus aureus NEGATIVE NEGATIVE Final    Comment: (NOTE) The Xpert SA Assay (FDA approved for NASAL specimens in patients 65 years of age  and older), is one component of a comprehensive surveillance program. It is not intended to diagnose infection nor to guide or monitor treatment. Performed at [facility], [address] [place], [place] [address]     Labs: CBC: Recent Labs  Lab 10/14/23 1114 10/14/23 1841 10/16/23 0914 10/17/23 0452 10/18/23 0554 10/19/23 1248 10/20/23 0555  WBC 17.3*   < > 16.1* 14.7* 10.7* 10.5 10.2  NEUTROABS 15.1*  --   --   --   --   --   --   HGB 9.6*   < > 6.9* 9.0* 8.0* 8.8* 8.3*  HCT 29.2*   < > 20.5* 26.2* 23.9* 26.6* 25.2*  MCV 89.0   < > 88.4 85.1 86.3 88.1 87.5  PLT 197   < > 157 162 183 230 239   < > = values in this interval not displayed.   Basic Metabolic Panel: Recent Labs  Lab 10/16/23 0614 10/17/23 0452 10/18/23 0554 10/19/23 1248 10/20/23 0555  NA 134* 133* 136 135 134*  K 5.0 4.5 4.0 4.0 4.1  CL 105 104 102 103 102  CO2 17* 19* 21* 23 22  GLUCOSE 145* 110* 106* 119* 103*  BUN 39* 46* 39* 27* 25*  CREATININE 1.68* 1.66* 1.34* 1.05* 0.98  CALCIUM  8.6* 8.5* 8.5* 8.8* 8.5*   Liver Function Tests: No results for input(s): AST, ALT, ALKPHOS, BILITOT, PROT, ALBUMIN in the last 168 hours. CBG: No results for input(s): GLUCAP in the last 168 hours.  Discharge time spent: greater than 30 minutes.  Signed: Nydia Distance, MD [facility] 10/20/2023

## 2023-10-20 NOTE — Progress Notes (Addendum)
 Physical Therapy Treatment Patient Details Name: Michelle Zavala MRN: [id number] DOB: May 15, 1946 Today's Date: 10/20/2023   History of Present Illness Pt is a 78 y.o. female presenting 1/31 after fall at home. Found to have acute communicated displaced intertrochanteric fx of thr R femur. Now s/p R intertrochanteric intramedullary nailing 2/1, WBAT. PMH: HTN, CAD, PAD, PSVT, AAA, COPD, GERD.    PT Comments  Pt received in supine, initially reluctant to mobilize OOB but agreeable with encouragement. Pt needing increased time/effort and up to minA for bed mobility from flat bed and also for transfers and short distance gait at bedside. Pt denies lightheadedness with standing this date but c/o feeling thirsty and quick to fatigue. Pt agreeable to sit up in recliner after assist to/from [facility] and assist to remove soiled briefs and place new ones, NT notified she will need R hip ice pack for pain, chair alarm on for safety. Pt will continue to benefit from skilled rehab in a post acute setting to maximize functional gains before returning home.     If plan is discharge home, recommend the following: A lot of help with walking and/or transfers;A lot of help with bathing/dressing/bathroom;Help with stairs or ramp for entrance;Assistance with cooking/housework   Can travel by private vehicle     No  Equipment Recommendations  Rolling walker (2 wheels);BSC/3in1    Recommendations for Other Services       Precautions / Restrictions Precautions Precautions: Fall Precaution Comments: Contact precs; orthostatic hypotension Restrictions Weight Bearing Restrictions Per Provider Order: Yes RLE Weight Bearing Per Provider Order: Weight bearing as tolerated Other Position/Activity Restrictions: bil knee high TED hose in place     Mobility  Bed Mobility Overal bed mobility: Needs Assistance Bed Mobility: Supine to Sit     Supine to sit: Min assist     General bed mobility comments: minA trunk raising,  pt having difficulty sitting up without rail use from flat bed due to RUE/shoulder pain and R hip pain; to R EOB    Transfers Overall transfer level: Needs assistance Equipment used: Rolling walker (2 wheels) Transfers: Sit to/from Stand, Bed to chair/wheelchair/BSC Sit to Stand: Min assist   Step pivot transfers: Min assist       General transfer comment: from EOB>BSC and BSC>chair    Ambulation/Gait Ambulation/Gait assistance: Editor, Commissioning (Feet): 5 Feet Assistive device: Rolling walker (2 wheels) Gait Pattern/deviations: Step-to pattern, Decreased stride length, Decreased weight shift to right, Trunk flexed       General Gait Details: distance limited due to pt c/o pain/fatigue after sitting up on BSC, pt needs mod cues for safety, RW management and directional navigation due to IV/purewick lines.   Stairs             Wheelchair Mobility     Tilt Bed    Modified Rankin (Stroke Patients Only)       Balance Overall balance assessment: Needs assistance Sitting-balance support: No upper extremity supported, Feet supported, Single extremity supported Sitting balance-Leahy Scale: Good     Standing balance support: Bilateral upper extremity supported, Reliant on assistive device for balance Standing balance-Leahy Scale: Poor Standing balance comment: reliant on RW                            Cognition Arousal: Alert Behavior During Therapy: WFL for tasks assessed/performed Overall Cognitive Status: Within Functional Limits for tasks assessed  General Comments: hard of hearing; follows basic commands but cognition not formally assessed. WFL for basic mobility assessed.        Exercises      General Comments General comments (skin integrity, edema, etc.): pt able to perform anterior peri-care after using BSC, needs assist for posterior peri-care and to pull up new briefs which PTA  brought to her room. Pt drinking soda during session and PTA reminded her she has been orthostatic (low BP) and needs to drink more water , pt agreeable when ice water  brought to her room to drink. up in recliner with alarm on for safety.      Pertinent Vitals/Pain Pain Assessment Pain Assessment: Faces Faces Pain Scale: Hurts little more Pain Location: R hip and R shoulder Pain Descriptors / Indicators: Discomfort, Sore, Grimacing, Guarding Pain Intervention(s): Monitored during session, Repositioned    Home Living                          Prior Function            PT Goals (current goals can now be found in the care plan section) Acute Rehab PT Goals Patient Stated Goal: be able to get home PT Goal Formulation: With patient Time For Goal Achievement: 10/30/23 Progress towards PT goals: Progressing toward goals    Frequency    Min 1X/week      PT Plan      Co-evaluation              AM-PAC PT 6 Clicks Mobility   Outcome Measure  Help needed turning from your back to your side while in a flat bed without using bedrails?: A Little Help needed moving from lying on your back to sitting on the side of a flat bed without using bedrails?: A Little Help needed moving to and from a bed to a chair (including a wheelchair)?: A Little Help needed standing up from a chair using your arms (e.g., wheelchair or bedside chair)?: A Little Help needed to walk in hospital room?: Total Help needed climbing 3-5 steps with a railing? : Total 6 Click Score: 14    End of Session Equipment Utilized During Treatment: Gait belt Activity Tolerance: Patient tolerated treatment well;Patient limited by fatigue;Patient limited by pain Patient left: in chair;with call bell/phone within reach;with chair alarm set Nurse Communication: Mobility status;Patient requests pain meds;Other (comment) (wants R hip ice pack -NT notified) PT Visit Diagnosis: Unsteadiness on feet  (R26.81);Other abnormalities of gait and mobility (R26.89)     Time: 1212-1231 PT Time Calculation (min) (ACUTE ONLY): 19 min  Charges:    $Therapeutic Activity: 8-22 mins PT General Charges $$ ACUTE PT VISIT: 1 Visit                     Mileigh Tilley P., PTA Acute Rehabilitation Services Secure Chat Preferred 9a-5:30pm Office: [phone]    Connell HERO [facility] 10/20/2023, 2:35 PM

## 2023-10-20 NOTE — TOC Transition Note (Signed)
 Transition of Care [facility]) - Discharge Note   Patient Details  Name: Michelle Zavala MRN: [id number] Date of Birth: [birth year]  Transition of Care [facility]) CM/SW Contact:  Bridget Cordella Simmonds, LCSW Phone Number: 10/20/2023, 11:32 AM   Clinical Narrative:   Pt discharging to [facility].  RN call report to [phone].  1000: [facility] is requesting transportation be set up for 2pm.  Final next level of care: Skilled Nursing Facility Barriers to Discharge: Barriers Resolved   Patient Goals and CMS Choice Patient states their goals for this hospitalization and ongoing recovery are:: get back on the road   Choice offered to / list presented to : Patient (pt requesting [facility] SNF)      Discharge Placement              Patient chooses bed at:  [facility]) Patient to be transferred to facility by: ptar Name of family member notified: left message with daughter [name] Patient and family notified of of transfer: 10/20/23  Discharge Plan and Services Additional resources added to the After Visit Summary for   In-house Referral: Clinical Social Work   Post Acute Care Choice: Skilled Nursing Facility                               Social Drivers of Health (SDOH) Interventions SDOH Screenings   Food Insecurity: No Food Insecurity (10/15/2023)  Housing: Low Risk  (10/15/2023)  Transportation Needs: No Transportation Needs (10/15/2023)  Utilities: Not At Risk (10/15/2023)  Alcohol Screen: Low Risk  (04/22/2022)  Depression (PHQ2-9): Medium Risk (05/09/2023)  Financial Resource Strain: Low Risk  (04/22/2022)  Physical Activity: Inactive (05/09/2023)  Social Connections: Socially Isolated (10/15/2023)  Stress: Stress Concern Present (05/09/2023)  Tobacco Use: Low Risk  (10/15/2023)  Health Literacy: Low Risk  (11/27/2020)   Received from [facility], [facility]     Readmission Risk Interventions    06/12/2022    1:18 PM  Readmission Risk Prevention Plan   Transportation Screening Complete  PCP or Specialist Appt within 5-7 Days Not Complete  Home Care Screening Complete  Medication Review (RN CM) Complete

## 2023-10-20 NOTE — Plan of Care (Signed)

## 2023-10-27 NOTE — Care Plan (Signed)
 LTC Post Acute Care Conference [facility])  10/27/2023 6:17 PM    Patient Name: Michelle Zavala MRN:  [id number] Admit Date/Time: 10/20/2023  4:44 PM Date of Birth:  03/14/1946 Sex:  Female Room/Bed: T853/T853-98   Patient Active Problem List   Diagnosis Date Noted  . Intertrochanteric fracture of right femur, sequela 10/20/2023  . Acute blood loss anemia 10/20/2023  . Folic acid  deficiency 10/20/2023  . ESBL (extended spectrum beta-lactamase) producing bacteria infection 10/20/2023  . HTN (hypertension) 10/20/2023  . CHF (congestive heart failure) (CMS-HCC) 10/20/2023  . COPD (chronic obstructive pulmonary disease) (CMS-HCC) 10/20/2023  . Normocytic anemia 10/20/2023  . Memory impairment 10/20/2023  . Anxiety 10/20/2023  . Depression 10/20/2023  . AAA (abdominal aortic aneurysm) (CMS-HCC) 10/20/2023  . Abnormal stress test 04/15/2022  . Purpura (CMS-HCC) 04/15/2022  . Black stools 04/15/2022  . Senile purpura (CMS-HCC) 04/14/2022  . Vertigo 12/12/2021  . Hypokalemia 12/11/2021  . Syncopal episodes 12/11/2021  . Dizziness 12/11/2021  . Fall at home, initial encounter 12/11/2021  . Nausea and vomiting 12/11/2021  . Cardiomyopathy (CMS-HCC) 09/17/2021  . Rib contusion, left, initial encounter 12/13/2020  . Right flank pain 12/04/2020  . GERD (gastroesophageal reflux disease) 10/22/2020  . Disorder of mucous membrane of gastrointestinal tract 12/06/2017  . 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CMS-HCC) 11/09/2017  . Coronary arteriosclerosis 11/09/2017  . 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 Stage 3 chronic kidney disease (CMS-HCC) 11/09/2017  . Upper airway cough syndrome 05/05/2017  . Rectal hemorrhage 04/29/2017  . Leukocytosis 10/22/2016  . Thyroid  nodule 11/04/2015  . 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 Traumatic closed fracture of C2 vertebra with minimal displacement (CMS-HCC) 09/20/2013  . Fracture of thoracic spine (CMS-HCC) 09/17/2013  .  Paroxysmal supraventricular tachycardia (CMS-HCC) 07/22/2011  . Benign essential hypertension 06/04/2009  . Dyspnea on exertion 06/04/2009  . Palpitations 06/04/2009  . Mixed hyperlipidemia 06/04/2009     Anticipated discharge date:  per insurance target LCD 11/01/23;  update due to ins. today  Team Updates -----------------------------------------------------------------------------------------------------  Met in SW office with daughter, Romaine Maciolek;  resident stayed in room with her sister to visit; Her goal is to return home;  lives by herself;  her daughter checks on her daily and will be fixing her medicine from now on;  daughter's boyfriend fixed her meals and took her to the MD She is not letting resident drive anymore  No steps to enter her home;  she has a FWW but was not using it;  has an Engineer, structural where she lives Was independent with ADL's;   we recommended she get resident a life alert or cameras to check on her as she is a fall risk;  has some cognitive deficit  SW gave her our contact list and list of HH agencies;  she is going to get back to me on which agency she would like to use;  she could not remember who she used the last time she was in rehab  PCP is Dr. Maree Pharmacy:  [facility] Reviewed insurance coverage;  has Medicaid so no copays;   if insurance issues a LCD this weekend, Jolyne thinks resident is ready to return home;  we did encourage that she accept the [facility] that is set up and reminders to use the FWW for safety ambulating  Discharge Planning Barriers: lives alone  Anticipated Discharge Location: home  ADL: set up with UB bathing, dressing;   LB contact;  transers Contact Guard  Mobility: walking 75 feet with a  FWW  Cognition / Communication: working with ST on [employer]: daughter is supportive

## 2023-11-10 DIAGNOSIS — I110 Hypertensive heart disease with heart failure: Secondary | ICD-10-CM | POA: Diagnosis not present

## 2023-11-10 DIAGNOSIS — I10 Essential (primary) hypertension: Secondary | ICD-10-CM | POA: Diagnosis not present

## 2023-11-14 ENCOUNTER — Ambulatory Visit (INDEPENDENT_AMBULATORY_CARE_PROVIDER_SITE_OTHER): Payer: 59 | Admitting: Orthopedic Surgery

## 2023-11-14 ENCOUNTER — Encounter: Payer: Self-pay | Admitting: Orthopedic Surgery

## 2023-11-14 DIAGNOSIS — M542 Cervicalgia: Secondary | ICD-10-CM
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
## 2023-11-17 DIAGNOSIS — S72141D Displaced intertrochanteric fracture of right femur, subsequent encounter for closed fracture with routine healing: Secondary | ICD-10-CM | POA: Diagnosis not present

## 2023-11-30 ENCOUNTER — Ambulatory Visit: Admitting: Physical Medicine and Rehabilitation

## 2023-12-01 DIAGNOSIS — I110 Hypertensive heart disease with heart failure: Secondary | ICD-10-CM | POA: Diagnosis not present

## 2023-12-01 DIAGNOSIS — D692 Other nonthrombocytopenic purpura: Secondary | ICD-10-CM | POA: Diagnosis not present

## 2023-12-01 DIAGNOSIS — S72141S Displaced intertrochanteric fracture of right femur, sequela: Secondary | ICD-10-CM | POA: Diagnosis not present

## 2023-12-01 NOTE — Discharge Summary (Signed)
 [facility]  PATIENT INTERDISCIPLINARY INSTRUCTION SHEET / DISCHARGE SUMMARY  Patient Name / DOB: Michelle Zavala  12-Jun-1946   Date of Admission to SNF: 10/20/2023   Date of Discharge: 12/03/2023  Primary Diagnoses requiring skilled care: Intertochanteric Fracture of Right Femur  Course of Treatment: Pt received extensive PT, OT and rehab nursing.    Discharge Medications: See After Visit Summary  Scheduled  . amitriptyline  (ELAVIL ) tablet 75 mg Nightly  . amoxicillin-clavulanate (AUGMENTIN) 875-125 mg per tablet 1 tablet Q12H SCH  . bisoprolol  (ZEBETA ) tablet 10 mg Daily  . folic acid  (FOLVITE ) tablet 800 mcg Daily  . furosemide  (LASIX ) tablet 20 mg Every other day  . lidocaine  (ASPERCREME) 4 % 1 patch Daily  . linaclotide  (LINZESS ) capsule 145 mcg daily  . LORazepam  (ATIVAN ) tablet 1 mg BID  . omeprazole (PriLOSEC) capsule 40 mg Nightly (2000)  . potassium chloride  ER tablet 20 mEq Every other day  . sertraline  (ZOLOFT ) tablet 100 mg Nightly (2000)  . spironolactone  (ALDACTONE ) tablet 25 mg Daily   PRN acetaminophen , 650 mg, Q8H PRN albuterol , 2 puff, Q4H PRN HYDROcodone -acetaminophen , 1 tablet, Q6H PRN ondansetron , 4 mg, Q8H PRN     Condition at Discharge: BZD:Dujaoz [x]  Improved--patient weak at baseline; recommend walker at all times.   Patient needs a rolling walker with brakes and seat to be used at all times due to unsteady gait and frequent falls with fractures. Patient is not to be left alone.  She will need PT, OT, and HH at discharge.   Participation Activity Progress:  Return to Work: No     Return to Driving: No    Leisure Activities: Yes; with modification  Discharge Location: YES--Home with Home Health    I certify that Lulu Hirschmann is confined to his/her home and needs intermittent skilled nursing care, physical therapy and/or speech therapy or continues to need occupational therapy. The patient is under my  care, and I have authorized services on this plan of care and will periodically review the plan. The patient had a face-to-face encounter with an allowed provider type on 12/01/2023 and the encounter was related to the primary reason for home health care.  Greater than  45  minutes spent on discharge process including conducting physical examination, providing patient counseling, reconciling medications and writing referrals.   Waddell KATHEE Mines, FNP

## 2023-12-05 DIAGNOSIS — R2681 Unsteadiness on feet: Secondary | ICD-10-CM | POA: Diagnosis not present

## 2023-12-05 DIAGNOSIS — I5022 Chronic systolic (congestive) heart failure: Secondary | ICD-10-CM | POA: Diagnosis not present

## 2023-12-05 DIAGNOSIS — R0689 Other abnormalities of breathing: Secondary | ICD-10-CM | POA: Diagnosis not present

## 2023-12-05 DIAGNOSIS — I10 Essential (primary) hypertension: Secondary | ICD-10-CM | POA: Diagnosis not present

## 2023-12-05 DIAGNOSIS — R112 Nausea with vomiting, unspecified: Secondary | ICD-10-CM | POA: Diagnosis not present

## 2023-12-05 DIAGNOSIS — Z299 Encounter for prophylactic measures, unspecified: Secondary | ICD-10-CM | POA: Diagnosis not present

## 2023-12-06 DIAGNOSIS — I509 Heart failure, unspecified: Secondary | ICD-10-CM | POA: Diagnosis not present

## 2023-12-06 DIAGNOSIS — S72144S Nondisplaced intertrochanteric fracture of right femur, sequela: Secondary | ICD-10-CM | POA: Diagnosis not present

## 2023-12-06 DIAGNOSIS — J449 Chronic obstructive pulmonary disease, unspecified: Secondary | ICD-10-CM | POA: Diagnosis not present

## 2023-12-06 DIAGNOSIS — I10 Essential (primary) hypertension: Secondary | ICD-10-CM | POA: Diagnosis not present

## 2023-12-09 DIAGNOSIS — R519 Headache, unspecified: Secondary | ICD-10-CM | POA: Diagnosis not present

## 2023-12-09 DIAGNOSIS — R2681 Unsteadiness on feet: Secondary | ICD-10-CM | POA: Diagnosis not present

## 2023-12-09 DIAGNOSIS — M542 Cervicalgia: Secondary | ICD-10-CM | POA: Diagnosis not present

## 2023-12-09 DIAGNOSIS — I429 Cardiomyopathy, unspecified: Secondary | ICD-10-CM | POA: Diagnosis not present

## 2023-12-09 DIAGNOSIS — Z299 Encounter for prophylactic measures, unspecified: Secondary | ICD-10-CM | POA: Diagnosis not present

## 2023-12-13 DIAGNOSIS — J449 Chronic obstructive pulmonary disease, unspecified: Secondary | ICD-10-CM | POA: Diagnosis not present

## 2023-12-13 DIAGNOSIS — I509 Heart failure, unspecified: Secondary | ICD-10-CM | POA: Diagnosis not present

## 2023-12-13 DIAGNOSIS — I10 Essential (primary) hypertension: Secondary | ICD-10-CM | POA: Diagnosis not present

## 2023-12-13 DIAGNOSIS — S72144S Nondisplaced intertrochanteric fracture of right femur, sequela: Secondary | ICD-10-CM | POA: Diagnosis not present

## 2023-12-16 DIAGNOSIS — J449 Chronic obstructive pulmonary disease, unspecified: Secondary | ICD-10-CM | POA: Diagnosis not present

## 2023-12-16 DIAGNOSIS — I10 Essential (primary) hypertension: Secondary | ICD-10-CM | POA: Diagnosis not present

## 2023-12-16 DIAGNOSIS — I509 Heart failure, unspecified: Secondary | ICD-10-CM | POA: Diagnosis not present

## 2023-12-16 DIAGNOSIS — S72144S Nondisplaced intertrochanteric fracture of right femur, sequela: Secondary | ICD-10-CM | POA: Diagnosis not present

## 2023-12-20 DIAGNOSIS — S72144S Nondisplaced intertrochanteric fracture of right femur, sequela: Secondary | ICD-10-CM | POA: Diagnosis not present

## 2023-12-20 DIAGNOSIS — I10 Essential (primary) hypertension: Secondary | ICD-10-CM | POA: Diagnosis not present

## 2023-12-20 DIAGNOSIS — I509 Heart failure, unspecified: Secondary | ICD-10-CM | POA: Diagnosis not present

## 2023-12-20 DIAGNOSIS — J449 Chronic obstructive pulmonary disease, unspecified: Secondary | ICD-10-CM | POA: Diagnosis not present

## 2023-12-30 DIAGNOSIS — I5022 Chronic systolic (congestive) heart failure: Secondary | ICD-10-CM | POA: Diagnosis not present

## 2023-12-30 DIAGNOSIS — N183 Chronic kidney disease, stage 3 unspecified: Secondary | ICD-10-CM | POA: Diagnosis not present

## 2023-12-30 DIAGNOSIS — I130 Hypertensive heart and chronic kidney disease with heart failure and stage 1 through stage 4 chronic kidney disease, or unspecified chronic kidney disease: Secondary | ICD-10-CM | POA: Diagnosis not present

## 2023-12-30 DIAGNOSIS — S72141D Displaced intertrochanteric fracture of right femur, subsequent encounter for closed fracture with routine healing: Secondary | ICD-10-CM | POA: Diagnosis not present

## 2024-01-12 DEATH — deceased

## 2024-03-26 ENCOUNTER — Ambulatory Visit: Payer: 59 | Admitting: Physician Assistant
